# Patient Record
Sex: Female | Born: 1990 | Race: White | Hispanic: No | Marital: Single | Smoking: Current some day smoker
Health system: Southern US, Community
[De-identification: ages and names within clinical notes are randomized; demographics above are authoritative.]

## PROBLEM LIST (undated history)

## (undated) DIAGNOSIS — F419 Anxiety disorder, unspecified: Secondary | ICD-10-CM

## (undated) DIAGNOSIS — I071 Rheumatic tricuspid insufficiency: Secondary | ICD-10-CM

## (undated) DIAGNOSIS — R011 Cardiac murmur, unspecified: Secondary | ICD-10-CM

## (undated) DIAGNOSIS — F329 Major depressive disorder, single episode, unspecified: Secondary | ICD-10-CM

## (undated) DIAGNOSIS — F32A Depression, unspecified: Secondary | ICD-10-CM

## (undated) DIAGNOSIS — I330 Acute and subacute infective endocarditis: Secondary | ICD-10-CM

## (undated) DIAGNOSIS — F17200 Nicotine dependence, unspecified, uncomplicated: Secondary | ICD-10-CM

## (undated) DIAGNOSIS — B999 Unspecified infectious disease: Secondary | ICD-10-CM

## (undated) DIAGNOSIS — I2690 Septic pulmonary embolism without acute cor pulmonale: Secondary | ICD-10-CM

## (undated) DIAGNOSIS — F1990 Other psychoactive substance use, unspecified, uncomplicated: Secondary | ICD-10-CM

## (undated) HISTORY — PX: CHOLECYSTECTOMY: SHX55

---
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
## 2009-08-13 ENCOUNTER — Emergency Department (HOSPITAL_COMMUNITY): Admission: EM | Admit: 2009-08-13 | Discharge: 2009-08-13 | Payer: Self-pay | Admitting: Family Medicine

## 2010-06-12 ENCOUNTER — Inpatient Hospital Stay (HOSPITAL_COMMUNITY)
Admission: AD | Admit: 2010-06-12 | Discharge: 2010-06-12 | Payer: Self-pay | Source: Home / Self Care | Attending: Obstetrics and Gynecology | Admitting: Obstetrics and Gynecology

## 2010-08-02 ENCOUNTER — Emergency Department (HOSPITAL_COMMUNITY): Payer: BC Managed Care – PPO

## 2010-08-02 ENCOUNTER — Inpatient Hospital Stay (HOSPITAL_COMMUNITY)
Admission: EM | Admit: 2010-08-02 | Discharge: 2010-08-03 | DRG: 463 | Disposition: A | Discharge status: Home / Self Care | Payer: BC Managed Care – PPO | Attending: Internal Medicine | Admitting: Internal Medicine

## 2010-08-02 DIAGNOSIS — R4182 Altered mental status, unspecified: Principal | ICD-10-CM | POA: Diagnosis present

## 2010-08-02 DIAGNOSIS — E871 Hypo-osmolality and hyponatremia: Secondary | ICD-10-CM | POA: Diagnosis present

## 2010-08-02 DIAGNOSIS — F1120 Opioid dependence, uncomplicated: Secondary | ICD-10-CM | POA: Diagnosis present

## 2010-08-02 DIAGNOSIS — F411 Generalized anxiety disorder: Secondary | ICD-10-CM | POA: Diagnosis present

## 2010-08-02 DIAGNOSIS — R17 Unspecified jaundice: Secondary | ICD-10-CM | POA: Diagnosis present

## 2010-08-02 DIAGNOSIS — R51 Headache: Secondary | ICD-10-CM | POA: Diagnosis present

## 2010-08-02 LAB — DIFFERENTIAL
Basophils Absolute: 0 10*3/uL (ref 0.0–0.1)
Basophils Relative: 0 % (ref 0–1)
Eosinophils Absolute: 0 10*3/uL (ref 0.0–0.7)
Lymphocytes Relative: 13 % (ref 12–46)
Lymphs Abs: 1.3 10*3/uL (ref 0.7–4.0)
Monocytes Absolute: 0.3 10*3/uL (ref 0.1–1.0)
Monocytes Relative: 3 % (ref 3–12)
Neutro Abs: 8.4 10*3/uL — ABNORMAL HIGH (ref 1.7–7.7)
Neutrophils Relative %: 83 % — ABNORMAL HIGH (ref 43–77)

## 2010-08-02 LAB — CBC
HCT: 37.9 % (ref 36.0–46.0)
Hemoglobin: 13.6 g/dL (ref 12.0–15.0)
MCH: 30.6 pg (ref 26.0–34.0)
MCHC: 35.9 g/dL (ref 30.0–36.0)
MCV: 85.4 fL (ref 78.0–100.0)
Platelets: 319 10*3/uL (ref 150–400)
RBC: 4.44 MIL/uL (ref 3.87–5.11)
RDW: 12 % (ref 11.5–15.5)
WBC: 10 10*3/uL (ref 4.0–10.5)

## 2010-08-02 LAB — ETHANOL: Alcohol, Ethyl (B): <5 mg/dL (ref 0–10)

## 2010-08-02 LAB — COMPREHENSIVE METABOLIC PANEL
ALT: 40 U/L — ABNORMAL HIGH (ref 0–35)
AST: 37 U/L (ref 0–37)
Alkaline Phosphatase: 84 U/L (ref 39–117)
BUN: 20 mg/dL (ref 6–23)
CO2: 18 mEq/L — ABNORMAL LOW (ref 19–32)
Calcium: 9.7 mg/dL (ref 8.4–10.5)
Chloride: 101 mEq/L (ref 96–112)
GFR calc Af Amer: >60 mL/min (ref >60)
GFR calc non Af Amer: >60 mL/min (ref >60)
Sodium: 133 mEq/L — ABNORMAL LOW (ref 135–145)
Total Bilirubin: 1.9 mg/dL — ABNORMAL HIGH (ref 0.3–1.2)

## 2010-08-02 LAB — URINALYSIS, ROUTINE W REFLEX MICROSCOPIC
Bilirubin Urine: NEGATIVE
Hgb urine dipstick: NEGATIVE
Ketones, ur: >80 mg/dL — AB
Nitrite: NEGATIVE
Protein, ur: NEGATIVE mg/dL
Specific Gravity, Urine: 1.03 (ref 1.005–1.030)
Urine Glucose, Fasting: NEGATIVE mg/dL
pH: 6.5 (ref 5.0–8.0)

## 2010-08-02 LAB — RAPID URINE DRUG SCREEN, HOSP PERFORMED
Amphetamines: NOT DETECTED
Barbiturates: NOT DETECTED
Benzodiazepines: NOT DETECTED
Cocaine: NOT DETECTED
Opiates: NOT DETECTED
Tetrahydrocannabinol: NOT DETECTED

## 2010-08-02 LAB — AMMONIA: Ammonia: 17 umol/L (ref 11–35)

## 2010-08-02 LAB — PREGNANCY, URINE: Preg Test, Ur: NEGATIVE

## 2010-08-03 ENCOUNTER — Inpatient Hospital Stay (HOSPITAL_COMMUNITY): Payer: BC Managed Care – PPO

## 2010-08-03 LAB — CBC
HCT: 34.9 % — ABNORMAL LOW (ref 36.0–46.0)
Hemoglobin: 12.3 g/dL (ref 12.0–15.0)
MCH: 30.2 pg (ref 26.0–34.0)
MCHC: 35.2 g/dL (ref 30.0–36.0)
MCV: 85.7 fL (ref 78.0–100.0)
RDW: 12.1 % (ref 11.5–15.5)

## 2010-08-03 LAB — DIFFERENTIAL
Basophils Relative: 0 % (ref 0–1)
Eosinophils Absolute: 0 10*3/uL (ref 0.0–0.7)
Eosinophils Relative: 0 % (ref 0–5)
Lymphocytes Relative: 25 % (ref 12–46)
Lymphs Abs: 1.8 10*3/uL (ref 0.7–4.0)
Monocytes Absolute: 0.5 10*3/uL (ref 0.1–1.0)
Monocytes Relative: 7 % (ref 3–12)
Neutro Abs: 4.7 10*3/uL (ref 1.7–7.7)

## 2010-08-03 LAB — COMPREHENSIVE METABOLIC PANEL
ALT: 32 U/L (ref 0–35)
AST: 27 U/L (ref 0–37)
Albumin: 4.1 g/dL (ref 3.5–5.2)
Alkaline Phosphatase: 68 U/L (ref 39–117)
BUN: 13 mg/dL (ref 6–23)
CO2: 23 mEq/L (ref 19–32)
Calcium: 9.4 mg/dL (ref 8.4–10.5)
Chloride: 104 mEq/L (ref 96–112)
Creatinine, Ser: 0.68 mg/dL (ref 0.4–1.2)
GFR calc Af Amer: >60 mL/min (ref >60)
GFR calc non Af Amer: >60 mL/min (ref >60)
Glucose, Bld: 87 mg/dL (ref 70–99)
Total Bilirubin: 1.7 mg/dL — ABNORMAL HIGH (ref 0.3–1.2)
Total Protein: 6.9 g/dL (ref 6.0–8.3)

## 2010-08-03 LAB — ANA: Anti Nuclear Antibody(ANA): NEGATIVE

## 2010-08-03 LAB — TSH: TSH: 0.716 u[IU]/mL (ref 0.350–4.500)

## 2010-08-03 LAB — SEDIMENTATION RATE: Sed Rate: 9 mm/hr (ref 0–22)

## 2010-08-03 LAB — VITAMIN B12: Vitamin B-12: 510 pg/mL (ref 211–911)

## 2010-08-03 LAB — HEPATITIS PANEL, ACUTE
Hep A IgM: NEGATIVE
Hepatitis B Surface Ag: NEGATIVE

## 2010-08-03 LAB — RPR: RPR Ser Ql: NONREACTIVE

## 2010-08-03 LAB — FOLATE RBC: RBC Folate: 811 ng/mL — ABNORMAL HIGH (ref 180–600)

## 2010-08-05 ENCOUNTER — Inpatient Hospital Stay (HOSPITAL_COMMUNITY)
Admission: EM | Admit: 2010-08-05 | Discharge: 2010-08-08 | DRG: 024 | Disposition: A | Discharge status: Home / Self Care | Payer: BC Managed Care – PPO | Attending: Internal Medicine | Admitting: Internal Medicine

## 2010-08-05 DIAGNOSIS — Z79899 Other long term (current) drug therapy: Secondary | ICD-10-CM

## 2010-08-05 DIAGNOSIS — R569 Unspecified convulsions: Principal | ICD-10-CM | POA: Diagnosis present

## 2010-08-05 DIAGNOSIS — F1120 Opioid dependence, uncomplicated: Secondary | ICD-10-CM | POA: Diagnosis present

## 2010-08-05 DIAGNOSIS — R000 Tachycardia, unspecified: Secondary | ICD-10-CM | POA: Diagnosis present

## 2010-08-05 DIAGNOSIS — F19939 Other psychoactive substance use, unspecified with withdrawal, unspecified: Secondary | ICD-10-CM | POA: Diagnosis present

## 2010-08-05 DIAGNOSIS — F410 Panic disorder [episodic paroxysmal anxiety] without agoraphobia: Secondary | ICD-10-CM | POA: Diagnosis present

## 2010-08-05 DIAGNOSIS — F1920 Other psychoactive substance dependence, uncomplicated: Secondary | ICD-10-CM | POA: Diagnosis present

## 2010-08-05 DIAGNOSIS — E876 Hypokalemia: Secondary | ICD-10-CM | POA: Diagnosis present

## 2010-08-05 LAB — CBC
HCT: 34.3 % — ABNORMAL LOW (ref 36.0–46.0)
Hemoglobin: 12.3 g/dL (ref 12.0–15.0)
MCH: 30.6 pg (ref 26.0–34.0)
MCHC: 35.9 g/dL (ref 30.0–36.0)
Platelets: 260 10*3/uL (ref 150–400)

## 2010-08-05 LAB — URINALYSIS, ROUTINE W REFLEX MICROSCOPIC
Protein, ur: 100 mg/dL — AB
Urine Glucose, Fasting: NEGATIVE mg/dL
Urobilinogen, UA: 0.2 mg/dL (ref 0.0–1.0)
pH: 6.5 (ref 5.0–8.0)

## 2010-08-05 LAB — COMPREHENSIVE METABOLIC PANEL
AST: 21 U/L (ref 0–37)
Albumin: 4.3 g/dL (ref 3.5–5.2)
Alkaline Phosphatase: 67 U/L (ref 39–117)
BUN: 6 mg/dL (ref 6–23)
Chloride: 109 mEq/L (ref 96–112)
Creatinine, Ser: 0.7 mg/dL (ref 0.4–1.2)
GFR calc Af Amer: >60 mL/min (ref >60)
Potassium: 3.4 mEq/L — ABNORMAL LOW (ref 3.5–5.1)
Total Protein: 6.9 g/dL (ref 6.0–8.3)

## 2010-08-05 LAB — RAPID URINE DRUG SCREEN, HOSP PERFORMED
Amphetamines: NOT DETECTED
Cocaine: NOT DETECTED
Opiates: NOT DETECTED
Tetrahydrocannabinol: NOT DETECTED

## 2010-08-05 LAB — DIFFERENTIAL
Lymphocytes Relative: 26 % (ref 12–46)
Monocytes Absolute: 0.8 10*3/uL (ref 0.1–1.0)
Monocytes Relative: 7 % (ref 3–12)
Neutro Abs: 7.5 10*3/uL (ref 1.7–7.7)

## 2010-08-05 LAB — POCT PREGNANCY, URINE: Preg Test, Ur: NEGATIVE

## 2010-08-06 LAB — METHADONE, URINE: Methadone: POSITIVE — AB

## 2010-08-06 NOTE — H&P (Signed)
Savannah, Palmer              ACCOUNT NO.:  [id number]  MEDICAL RECORD NO.:  [id number]           PATIENT TYPE:  E  LOCATION:  [facility]                         FACILITY:  [facility]  PHYSICIAN:  Mariea Stable, MD   DATE OF BIRTH:  [birth year]  DATE OF ADMISSION:  08/05/2010 DATE OF DISCHARGE:                             HISTORY & PHYSICAL   PRIMARY CARE PHYSICIAN:  [facility].  PSYCHIATRIST:  Archer Asa, MD  CHIEF COMPLAINT:  Seizure.  HISTORY OF PRESENT ILLNESS:  Ms. Savannah Palmer is a 20 year old woman with a past medical history significant for chronic methadone use as well as anxiety/panic attack on chronic Xanax who presents with new-onset seizure.  Also of note that the patient just had an admission on February 15 for altered mental status, although the patient was admitted for observation with a pretty extensive workup and thought to be secondary to medications.  Since that discharge, her mother Savannah Palmer) has flown from [place] and has been with her 24/7.  She states that she had read up on methadone as her daughter was trying to detox from it.  Apparently the daughter has been restless and has not had much sleep since discharge.  She was seen seated in a slightly colder position on the couch trying to get some rest.  The mother actually dozed off and subsequently awoke finding her in a what she describes to be tonic-clonic seizure.  Apparently, the patient was having generalized convulsions at that time and she reports it lasted for approximately 30 seconds.  She states after that the patient was confused in what appears to have been a postictal phase.  Within short few minutes, the patient had another seizure that lasted approximately 1 minute.  At that time, EMS was called and the patient was brought to the emergency department. Of note, the patient did have another seizure in the emergency department and has since received Ativan 1 mg IV push  x2.  Currently, the patient is arousable and follows simple commands though she is very somnolent secondary to meds plus or minus postictal phase and most of the history was provided by mother, father, and stepmother.  PAST MEDICAL HISTORY: 1. Recent admission for altered mental status as mentioned above. 2. Anxiety/panic disorder. 3. 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  MEDICATIONS: 1. Possible methadone 100 mg orally daily per mother. 2. Xanax 0.5 mg p.o. q.i.d. p.r.n. anxiety.  SOCIAL HISTORY:  The patient apparently has smoked for approximately 2 years and recently quit.  She drinks socially.  Apparently, she cleans hotels for a living.  She lives alone in her apartment, although mother is currently staying with her.  Again, mother has flown in from [place] to care for her daughter, name Savannah Palmer phone #[phone].  Her father who lives locally is here at bedside as well, his name Savannah Palmer and his phone number is [phone]- [phone].  Of note, his wife, Efraim Kaufmann is also at bedside.  As a family, they have agreed that any of them are to be contacted in case of an emergency.  FAMILY HISTORY:  Mother is 19 years old alive and healthy as is the father.  ALLERGIES:  No known drug allergies.  REVIEW OF SYSTEMS:  As per HPI, currently unobtainable as the patient is markedly lethargic status post seizure and IV Ativan.  PHYSICAL EXAMINATION:  VITAL SIGNS:  Temperature 99.2, blood pressure 109/74, heart rate of 123, respirations 16, and oxygen saturation 95% on room air (this was on presentation).  Current vitals are heart rate of 84, blood pressure 101/61, respirations 20, oxygen saturation 100% on room air. GENERAL:  There is a young woman lying in bed, somnolent, although easily arousable and following simple commands. HEENT:  Head is normocephalic and atraumatic.  Pupils are dilated but equally round and reactive to light.   Extraocular movements are grossly intact.  Sclerae anicteric. NECK:  Supple.  There is no JVD or carotid bruits or thyromegaly. HEART:  There is a normal S1 and S2 with a regular rate and rhythm. There are no murmurs, gallops, or rubs. LUNGS:  Clear to auscultation bilaterally. ABDOMEN:  Positive bowel sounds.  Soft, nontender, and nondistended.  No organomegaly appreciated. EXTREMITIES:  There is no cyanosis, clubbing, or edema. NEUROLOGIC:  Again, the patient is somnolent but easily arousable and able to follow simple commands.  Cranial nerves are grossly intact.  The patient is moving all extremities.  LABORATORY DATA:  Urine pregnancy test is negative.  Urinalysis shows 15 ketones, large blood, 100 protein, and trace leukocyte esterase with 0-2 wbc's on microscopy along with too numerous to count red blood cells.  ASSESSMENT AND PLAN: 1. Seizure.  This is most likely secondary to benzo withdrawal.     Again, the patient follows with Dr. Donell Beers and apparently has     Xanax 0.5 mg p.o. q.i.d. p.r.n. anxiety/panic.  However, given her     history and furthermore collaborated by family, there is great     concern that she may be overusing these medications and/or possibly     other medications that she is not prescribed.  Furthermore, the     patient is on chronic methadone and although she received a dose on     Thursday prior to discharge, apparently she has not had any since     that she wanted to detox off it.  Her mother confirms that she has     not had any.  However, I do not think that methadone would be a     likely cause of her seizure.  The patient did have an extensive     workup just recently without any significant metabolic     abnormalities identified.  She also had a CT scan of head at that     time without any acute findings.  At this point, we will go ahead     and admit to a telemetry floor to monitor for further withdrawal     symptoms.  We will go ahead and  place her on seizure precautions.     We will give her 0.5 mg of Ativan IV t.i.d. to help prevent further     seizures.  We will also prescribe Ativan 2 mg IV p.r.n. seizures.     We will go ahead and check some basic labs including a CBC and a     CMP.  Furthermore, we will check an alcohol level as well as a     urine drug screen and urine methadone.  More importantly, the     patient  will need a social work consult for further resources on     detox.  May also consider a Psychiatry consultation tomorrow to see     if the patient would be eligible for inpatient detox. 2. History of chronic methadone use/dependency.  Please see of above     and the recent admission.  The patient is currently trying to detox     and has been 2 days without it.  At this point, we will not     continue since I do not think this is the source of her seizure and     she does not have any chronic pain.  This was actually being used     according to family for history of OxyContin abuse.  May consider     starting once the patient's mental status clears and further     information is obtained. 3. History of anxiety/panic disorder.  The patient follows with Dr.     Donell Beers for this, it would be beneficial to     obtain the records for further details.  At this point, we will     treat as per #1.  Again, consider Psychiatry consult in the morning     to evaluate for further treatment options with benzos and this     possible benzo withdraws.  It seems that the patient is misusing     her prescribed benzos.     Mariea Stable, MD     MA/MEDQ  D:  08/05/2010  T:  08/05/2010  Job:  [id number]  Electronically Signed by Mariea Stable MD on 08/06/2010 05:26:40 AM

## 2010-08-07 ENCOUNTER — Inpatient Hospital Stay (HOSPITAL_COMMUNITY): Payer: BC Managed Care – PPO

## 2010-08-07 DIAGNOSIS — F1120 Opioid dependence, uncomplicated: Secondary | ICD-10-CM

## 2010-08-07 LAB — BASIC METABOLIC PANEL
Calcium: 9.3 mg/dL (ref 8.4–10.5)
Creatinine, Ser: 0.69 mg/dL (ref 0.4–1.2)
GFR calc Af Amer: >60 mL/min (ref >60)
GFR calc non Af Amer: >60 mL/min (ref >60)
Sodium: 141 mEq/L (ref 135–145)

## 2010-08-07 LAB — CBC
MCH: 30 pg (ref 26.0–34.0)
MCHC: 34.7 g/dL (ref 30.0–36.0)
Platelets: 254 10*3/uL (ref 150–400)

## 2010-08-07 LAB — LACTIC ACID, PLASMA: Lactic Acid, Venous: 2.5 mmol/L — ABNORMAL HIGH (ref 0.5–2.2)

## 2010-08-07 NOTE — Discharge Summary (Signed)
Savannah Palmer, Savannah Palmer              ACCOUNT NO.:  [id number]  MEDICAL RECORD NO.:  [id number]           PATIENT TYPE:  I  LOCATION:  1412                         FACILITY:  [facility]  PHYSICIAN:  Hillery Aldo, M.D.   DATE OF BIRTH:  07/29/1990  DATE OF ADMISSION:  08/02/2010 DATE OF DISCHARGE:  08/03/2010                              DISCHARGE SUMMARY   PRIMARY CARE PHYSICIAN:  [facility].  DISCHARGE DIAGNOSES: 1. Transient altered mental status. 2. Transient headaches. 3. Hyperbilirubinemia. 4. Hyponatremia, resolved. 5. Questionable history of methadone addiction. 6. Anxiety.  DISCHARGE MEDICATIONS: 1. Xanax 0.5 mg p.o. q.i.d. p.r.n. anxiety. 2. Questionable methadone 110 mg p.o. daily.  CONSULTATIONS:  None.  BRIEF ADMISSION HPI:  The patient is a 20 year old female who was evaluated for headache in the emergency department and was felt to have altered mental status with confusion and disorientation.  Uponevaluation by the Hospitalist, however, the patient was oriented x3 and completely alert.  Nevertheless, she was admitted for 23-hour observation, so a workup could be completed.  For the full details, please see the dictated report done by Dr. Pearson Grippe.  PROCEDURES AND DIAGNOSTIC STUDIES: 1. CT scan of the head on August 02, 2010, showed normal findings. 2. Abdominal ultrasound on August 03, 2010, showed possible mild     intrahepatic ductal dilatation, but no specific or acute     abnormalities and no focal liver abnormalities.  DISCHARGE LABORATORY VALUES:  Acute hepatitis panel was negative.  ANA was negative.  ESR was 9.  RPR was nonreactive.  Sodium was 135, potassium 3.8, chloride 104, bicarb 23, BUN 13, creatinine 0.68, glucose 87, total bilirubin 1.7, alkaline phosphatase 68, AST 27, ALT 32, total protein 6.9, albumin 4.1.  White blood cell count was 7, hemoglobin 12.3, hematocrit 34.9, platelets 274.  Serum folate was greater than  20, vitamin B12 510, TSH 0.716, ammonia 17.  Urine drug screen was negative. Urinalysis was negative for signs of infection.  There was 80 mg/dL of ketones and no blood or protein.  Urine pregnancy testing was negative. Alcohol level was less than 5.  HOSPITAL COURSE BY PROBLEMS: 1. Altered mental status:  Unclear etiology.  The patient was normal     on evaluation by the admitting hospitalist and has been normal by     my exam as well.  She has no evidence of hyperammonemia, CT scan     abnormalities of the brain, or focal findings on neurologic     testing.  The patient may have been intoxicated on initial     presentation, although her urine drug screen is negative and her     alcohol level was negative.  She may have an underlying psychiatric     condition and need further outpatient evaluation should she become     altered again. 2. Hyperbilirubinemia:  The patient may have underlying Gilbert     disease.  There is no evidence of viral hepatitis.  She had been     instructed to follow up with the Urgent Care in 3-6 months for     repeat check. 3. Hyponatremia:  The patient's hyponatremia resolved with IV fluids. 4. Questionable narcotics addiction:  The patient claims to be on     methadone daily.  She can follow up with her chronic pain with Dr.     Virginia Rochester, her prescriber for ongoing management. 5. Anxiety:  The patient was provided with Xanax p.r.n., which she can     continue postdischarge.  Would recommend psychiatric followup.  CONDITION ON DISCHARGE:  Stable.  Time spent coordinating care for discharge and discharge instructions including face-to-face time equals 20 minutes.     Hillery Aldo, M.D.     CR/MEDQ  D:  08/03/2010  T:  08/04/2010  Job:  [id number]  cc:   [facility]  Electronically Signed by Hillery Aldo M.D. on 08/07/2010 04:18:13 PM

## 2010-08-09 NOTE — Consult Note (Signed)
NAMESARITA, HAKANSON              ACCOUNT NO.:  [id number]  MEDICAL RECORD NO.:  [id number]           PATIENT TYPE:  I  LOCATION:  3703                         FACILITY:  [facility]  PHYSICIAN:  Eulogio Ditch, MD DATE OF BIRTH:  [birth year]  DATE OF CONSULTATION:  08/07/2010 DATE OF DISCHARGE:                                CONSULTATION   REASON FOR CONSULTATION:  Benzoin methadone dependence.  HISTORY OF PRESENT ILLNESS:  A 20 year old white female who was seen in her room with her mother with the patient's consent.  The patient has a long history of abuse, opioids and benzos.  The patient is on methadone 100 mg at a clinic on [address] along with Xanax 0.5 mg q.i.d.  The patient told me that she was abusing pain pills before and to get off the pain pills she was started on methadone 100 mg p.o. daily.  Now the patient does not want to take methadone.  She just want to be detoxed from methadone.  The patient had a seizure in the hospital most likely from benzos withdrawal.  The patient is getting Ativan p.r.n..  The patient has a history of bipolar disorder.  She was in the past on Depakote and Lamictal, but she is not taking those medication for the last 2 years.  She was followed by Dr. Tomasa Rand, the outpatient psychiatrist, and later on Dr. Donell Beers.  The patient has never been admitted to a psych hospital.  No history of suicide attempt in the past.  Mood is poor.  The patient is logical and goal directed during interview.  Denies hearing any voices.  Denies any suicidal or homicidal ideations.  The patient last time took 100 mg methadone last Thursday and this is day #4 without the methadone.  The patient still had some withdrawal symptoms from opioids but she is on a clonidine detox protocol.  The patient wanted to be continued on her Xanax for anxiety as nothing has helped in the past besides Xanax.  SOCIAL HISTORY:  The patient does a cleaning job in the  hotels. Currently, her mother is staying with the patient.  MEDICAL HISTORY:  History of seizure, from benzo withdrawal likely.  ALLERGIES:  No known drug allergies.  MENTAL STATUS EXAMINATION:  Calm, cooperative during the interview. Fair eye contact.  Logical and goal-directed during the interview. Speech slow, soft.  Mood anxious.  Affect mood congruent.  Thought process logical and goal-directed.  Thought content not suicidal or homicidal, not delusional.  Thought perception no audiovisual hallucination reported, not internally preoccupied.  Cognition alert, awake, oriented x3.  Memory immediate, recent, remote fair.  Attention and concentration fair.  Abstraction ability good.  Fund of knowledge fair.  Insight and judgment fair.  DIAGNOSES:  AXIS I:  Chronic opioids and benzo dependence.  Mood disorder not otherwise specified, rule out bipolar disorder, rule out substance-induced manic symptoms/bipolar disorder. AXIS II:  Deferred. AXIS III:  See medical notes. AXIS IV:  Chronic drug abuse. AXIS V:  50.  RECOMMENDATIONS: 1. The patient can be continued on detox from methadone on a clonidine     detox protocol. 2.  The patient is on Ativan p.r.n.  The patient should be started back     on Xanax 0.5 q.i.d.  First her methadone detox should be completed     and then benzos which is very difficult to detox in the inpatient     setting can be slowly detoxed in the outpatient setting.  The     patient also had a seizure episode from benzos withdrawal. 3. I discussed different medications with the patient.  The patient     agreed to be started on Seroquel 25 mg q.i.d. for anxiety. 4. This patient will also need to followup appointment with a     psychiatrist in the outpatient setting. 5. Psychoeducation given to the patient regarding the abuse of the     medication and taking Xanax along with methadone.  Both can lead to     respiratory depression.  I will follow up on this  patient as     needed.     Eulogio Ditch, MD     SA/MEDQ  D:  08/07/2010  T:  08/07/2010  Job:  [id number]  Electronically Signed by Eulogio Ditch  on 08/09/2010 04:49:17 AM

## 2010-08-15 NOTE — Discharge Summary (Signed)
NAMEALIXIS, Savannah Palmer              ACCOUNT NO.:  [id number]  MEDICAL RECORD NO.:  [id number]           PATIENT TYPE:  I  LOCATION:  3703                         FACILITY:  [facility]  PHYSICIAN:  Erick Blinks, MD     DATE OF BIRTH:  1991-06-11  DATE OF ADMISSION:  08/05/2010 DATE OF DISCHARGE:  08/08/2010                              DISCHARGE SUMMARY   PRIMARY CARE PHYSICIAN:  Urgent Care.  PSYCHIATRIST:  Archer Asa, MD  DISCHARGE DIAGNOSES: 1. Seizures secondary to benzodiazepine withdrawal. 2. Benzodiazepine withdrawal. 3. Methadone withdrawal. 4. Tachycardia secondary to benzodiazepine withdrawal. 5. History of anxiety/panic disorder. 6. Hypokalemia.  DISCHARGE MEDICATIONS: 1. Clonidine 0.1 mg to be taken as q.i.d. for 1 day, then b.i.d. for 2     days, then daily for 2 days, and then stop. 2. Seroquel 25 mg p.o. q.i.d. 3. Xanax 0.5 mg p.o. q.6 h. p.r.n.  ADMISSION HISTORY:  This is a 20 year old woman with the past medical history of chronic methadone use as well as anxiety/panic attack who had ran out of her Xanax approximately 1 week prior to admission.  The patient presented with new onset seizures and had a seizure here in the ER as well.  She was subsequently admitted for further evaluation.  For further details, please refer to history and physical dictated by Dr. Onalee Hua on August 05, 2010.  HOSPITAL COURSE:  Benzodiazepine withdrawal.  The patient was seen in consultation by Dr. Rogers Blocker from the Psychiatry Service.  He was felt that since the patient had both opiates as well as the benzodiazepine withdrawal going on, it would be better to treat the opiate withdrawal at this time and address the benzodiazepine withdrawal once the opiate withdrawal has resolved.  Her benzodiazepine withdrawal will have to be tapered very slowly over months.  She was initially placed on Ativan. This was changed to Xanax per Dr. Alvie Heidelberg recommendation.  She was also  started on Seroquel for anxiety.  The patient was found to be significantly tachycardic.  This was thought to be secondary to her withdrawal, but that has since resolved.  She did have an EEG done due to persistence which she was having, but results of this did not show any epileptiform activity.  She is no longer having any symptoms at this time.  She is feeling better and wanted to go home.  The patient will be followed up by Psychiatry today, and she is cleared for discharge.  We will plan on sending her home today.  She has been set up with an outpatient appointment with her primary psychiatrist on August 10, 2010, at 10:30 a.m.  She has been advised to keep this appointment.  The patient has good support with her mother.  CONSULTATIONS:  Psychiatry, Eulogio Ditch, MD with recommendations as above.  PROCEDURES:  None.  DIAGNOSTIC IMAGING: 1. Chest x-ray from August 07, 2010, shows negative chest. 2. EEG from August 07, 2010, shows drowsiness and sleep stages.     Given the patient's medication as listed, this drowsiness would be     well attributed due to the medication as a side-effect.  Epileptiform activity is not seen.  DISCHARGE INSTRUCTIONS:  The patient should continue on regular diet. Conduct activity as tolerated.  Follow up with her primary psychiatrist as scheduled.  She advised to be compliant with her medication.     Erick Blinks, MD     JM/MEDQ  D:  08/08/2010  T:  08/09/2010  Job:  [id number]  cc:   Urgent Care Archer Asa, M.D.  Electronically Signed by Erick Blinks  on 08/15/2010 07:16:19 PM

## 2010-08-28 LAB — WET PREP, GENITAL
Trich, Wet Prep: NONE SEEN
Yeast Wet Prep HPF POC: NONE SEEN

## 2010-08-28 LAB — CBC
Platelets: 195 10*3/uL (ref 150–400)
RDW: 11.8 % (ref 11.5–15.5)
WBC: 6 10*3/uL (ref 4.0–10.5)

## 2010-08-28 LAB — POCT PREGNANCY, URINE: Preg Test, Ur: NEGATIVE

## 2010-09-06 LAB — POCT URINALYSIS DIP (DEVICE)
Bilirubin Urine: NEGATIVE
Ketones, ur: NEGATIVE mg/dL
Specific Gravity, Urine: <=1.005 (ref 1.005–1.030)
pH: 6.5 (ref 5.0–8.0)

## 2010-09-06 NOTE — H&P (Signed)
Savannah Palmer, Savannah Palmer              ACCOUNT NO.:  [id number]  MEDICAL RECORD NO.:  [id number]           PATIENT TYPE:  E  LOCATION:  WLED                         FACILITY:  [facility]  PHYSICIAN:  Massie Maroon, MD        DATE OF BIRTH:  1990/06/19  DATE OF ADMISSION:  08/02/2010 DATE OF DISCHARGE:                             HISTORY & PHYSICAL   CHIEF COMPLAINT:  Altered mental status.  HISTORY OF PRESENT ILLNESS:  A 20 year old female apparently complains of headache which is all over and associated with some nausea but no vomiting.  She denies any photophobia.  She denies chest pain, palpitations, shortness of breath, dysuria, hematuria.  Apparently in the ED, she was felt to be altered.  A CT scan of brain was performed and it was negative for any acute process.  Family apparently told the ER that she was confused earlier today and the patient was not alert and oriented to place or time.  She is however to me alert and oriented x3. Her ALT was noted be elevated.  Ammonia level is pending.  The patient will be admitted for altered mental status.  PAST MEDICAL HISTORY: 1. Anxiety. 2. Depression. 3. Narcotic addiction.  PAST SURGICAL HISTORY:  None.  SOCIAL HISTORY:  The patient quit smoking recently.  She smoked one-pack per day x2 years.  She does not drink.  She cleans for hotels.  She is single without any children.  FAMILY HISTORY:  Mother is alive at age 21 and healthy.  Father is alive at age 21 and healthy.  ALLERGIES:  No known drug allergies.  MEDICATIONS: 1. Methadone 110 mg p.o. daily. 2. Xanax 0.4 mg p.o. q.i.d.  REVIEW OF SYSTEMS:  Negative for all 10 organ systems except for pertinent positives stated above.  PHYSICAL EXAMINATION:  VITAL SIGNS:  Temperature 99.2, pulse 101, blood pressure 136/81, pulse ox 100% on room air. HEENT:  Anicteric, EOMI, no nystagmus, pupils 1.5 mm, symmetric.  Direct consensual reflexes are intact.  Mucous membranes are moist.   Tongue midline. NECK:  No JVD, no bruit, no thyromegaly, no adenopathy. HEART:  Regular rate and rhythm.  S1, S2 with a 2/6 systolic ejection murmur at the apex. Lungs:  Clear to auscultation bilaterally. ABDOMEN:  Soft, nontender, nondistended.  Positive bowel sounds. EXTREMITIES:  No cyanosis, clubbing or edema. SKIN:  No rashes. LYMPH NODES:  No adenopathy. NEURO EXAM:  Nonfocal.  LABORATORY DATA:  Urine drug screen negative.  Urinalysis, ketones greater than 80.  Urine pregnancy test negative.  Alcohol less than 5. Sodium 133 (potassium 3.9, BUN 20, creatinine 0.89, AST 37, ALT 40), alk phos 84, total bilirubin 1.9.  WBC 10.0, hemoglobin 13.6, platelet count 319.  CT brain negative for any acute process.  ASSESSMENT/PLAN: 1. Altered mental status, possibly secondary to withdrawal from     methadone ?  Check B12, folic acid, ESR, ANA, RPR, TSH, ammonia     level STAT.  We will observe the patient.  Repeat a CBC and CMP in     a.m. 2. Abnormal liver function tests.  Check a right upper quadrant  ultrasound.  Check an acute hepatitis panel. 3. Hyponatremia.  Repeat CMET in the morning, still consider checking     serum OSM, urine OSM, urine sodium, TSH and cortisol. 4. Anxiety.  Xanax 0.5 mg p.o. q.i.d. p.r.n., chronic pain     syndrome/narcotic addiction.  Methadone 100 mg p.o. daily.     Massie Maroon, MD     JYK/MEDQ  D:  08/02/2010  T:  08/02/2010  Job:  [id number]  Electronically Signed by Pearson Grippe MD on 09/06/2010 09:41:09 PM

## 2010-09-25 LAB — POCT I-STAT, CHEM 8
Calcium, Ion: 1.21 mmol/L (ref 1.12–1.32)
HCT: 42 % (ref 36.0–46.0)
Hemoglobin: 14.3 g/dL (ref 12.0–15.0)
TCO2: 24 mmol/L (ref 0–100)

## 2010-09-25 LAB — DIFFERENTIAL
Basophils Absolute: 0 10*3/uL (ref 0.0–0.1)
Lymphs Abs: 2 10*3/uL (ref 0.7–4.0)
Monocytes Absolute: 0.2 10*3/uL (ref 0.1–1.0)
Monocytes Relative: 5 % (ref 3–12)

## 2010-09-25 LAB — RAPID URINE DRUG SCREEN, HOSP PERFORMED
Benzodiazepines: POSITIVE — AB
Cocaine: NOT DETECTED

## 2010-09-25 LAB — CBC
HCT: 40 % (ref 36.0–46.0)
Platelets: 213 10*3/uL (ref 150–400)
RDW: 12.5 % (ref 11.5–15.5)

## 2010-09-25 LAB — ETHANOL: Alcohol, Ethyl (B): <5 mg/dL (ref 0–10)

## 2011-06-29 ENCOUNTER — Encounter (HOSPITAL_COMMUNITY): Payer: Self-pay

## 2011-06-29 ENCOUNTER — Emergency Department (INDEPENDENT_AMBULATORY_CARE_PROVIDER_SITE_OTHER)
Admission: EM | Admit: 2011-06-29 | Discharge: 2011-06-29 | Disposition: A | Discharge status: Home / Self Care | Payer: Self-pay | Source: Home / Self Care | Attending: Family Medicine | Admitting: Family Medicine

## 2011-06-29 DIAGNOSIS — F411 Generalized anxiety disorder: Secondary | ICD-10-CM

## 2011-06-29 DIAGNOSIS — F419 Anxiety disorder, unspecified: Secondary | ICD-10-CM

## 2011-06-29 HISTORY — DX: Anxiety disorder, unspecified: F41.9

## 2011-06-29 MED ORDER — ALPRAZOLAM 0.5 MG PO TABS: 0.5000 mg | ORAL_TABLET | Freq: Four times a day (QID) | ORAL | Status: AC | PRN

## 2011-06-29 NOTE — ED Provider Notes (Signed)
History     CSN: [id number]  Arrival date & time 06/29/11  5621   First MD Initiated Contact with Patient 06/29/11 1009      Chief Complaint  Patient presents with  . Medication Refill    (Consider location/radiation/quality/duration/timing/severity/associated sxs/prior treatment) HPI Comments: Savannah Palmer presents for a refill of her Xanax. She reports a chronic hx of anxiety and panic attacks. She has recently moved back to [place] from [place] and has not established care with a psychiatrist yet, though she reports that she thinks that her father has made an appointment for her on the 21st of this month. She reports being on several different regimen over the years including daily benzodiazepenes, Buspar, Prozac (did not tolerate), and other medications which she is unable to remember. Lately, she has only been taking Xanax 0.5 mg FOUR TIMES daily AS NEEDED; she admits trying not to take it that often. We discuss the importance of regular psychiatric follow up, the dangers and risks of daily benzodiazepene use, potential of abuse, and danger of stopping these medications abruptly. I am willing to refill a small amount today given the risk of benzodiazepene withdrawal, and that she thinks she has a follow-up appointment. We discussed the [facility] by [facility] as an option as well.   Patient is a 21 y.o. female presenting with anxiety. The history is provided by the patient.  Anxiety This is a chronic problem. The current episode started more than 1 week ago. The problem occurs constantly. The problem has not changed since onset.Pertinent negatives include no chest pain. The symptoms are aggravated by nothing. The symptoms are relieved by medications. She has tried nothing for the symptoms.    Past Medical History  Diagnosis Date  . Anxiety     Past Surgical History  Procedure Date  . Cholecystectomy     History reviewed. No pertinent family history.  History    Substance Use Topics  . Smoking status: Never Smoker   . Smokeless tobacco: Not on file  . Alcohol Use: Yes    OB History    Grav Para Term Preterm Abortions TAB SAB Ect Mult Living                  Review of Systems  Constitutional: Negative.   HENT: Negative.   Eyes: Negative.   Respiratory: Negative.   Cardiovascular: Negative.  Negative for chest pain.  Gastrointestinal: Negative.   Genitourinary: Negative.   Musculoskeletal: Negative.   Skin: Negative.   Neurological: Negative.   Psychiatric/Behavioral: Positive for sleep disturbance. Negative for self-injury. The patient is nervous/anxious and is hyperactive.     Allergies  Review of patient's allergies indicates no known allergies.  Home Medications   Current Outpatient Rx  Name Route Sig Dispense Refill  . ALPRAZOLAM 0.5 MG PO TABS Oral Take 0.5 mg by mouth at bedtime as needed.    . ALPRAZOLAM 0.5 MG PO TABS Oral Take 1 tablet (0.5 mg total) by mouth 4 (four) times daily as needed for anxiety (Take UP TO four times daily AS NEEDED). 40 tablet 0    BP 115/66  Pulse 78  Temp(Src) 99.7 F (37.6 C) (Oral)  Resp 18  SpO2 99%  LMP 06/27/2011  Physical Exam  Nursing note and vitals reviewed. Constitutional: She is oriented to person, place, and time. She appears well-developed and well-nourished.  HENT:  Head: Normocephalic and atraumatic.  Eyes: EOM are normal.  Neck: Normal range of motion.  Cardiovascular: Normal  rate, regular rhythm and normal heart sounds.   Pulmonary/Chest: Effort normal and breath sounds normal.  Musculoskeletal: Normal range of motion.  Neurological: She is alert and oriented to person, place, and time.  Skin: Skin is warm and dry.  Psychiatric: She has a normal mood and affect. Her speech is normal and behavior is normal. Judgment and thought content normal.    ED Course  Procedures (including critical care time)  Labs Reviewed - No data to display No results found.   1.  Anxiety       MDM  Refilled Xanax; discuss the importance of psychiatric follow-up, abuse potential of daily benzodiazepene use, and risks of abrupt cessation/running out of these medications. Given information on [facility].        Richardo Priest, MD 06/29/11 1113

## 2011-06-29 NOTE — Discharge Instructions (Signed)
Please follow up with your mental health provider as scheduled and as discussed. If you are unable to keep or make an appointment, the [facility], listed on your discharge papers, is available daily for an assessment. If your symptoms worsen in any way, present to the nearest Emergency Department.

## 2011-06-29 NOTE — ED Notes (Addendum)
needs new Rx for xanax; reportedly recent move to area from [place], out of meds, unable to set up w psych as yet ; having panic attacks ; had reportedly been using xanax 0.5 QID

## 2011-08-20 ENCOUNTER — Encounter (HOSPITAL_COMMUNITY): Payer: Self-pay

## 2011-08-20 ENCOUNTER — Emergency Department (INDEPENDENT_AMBULATORY_CARE_PROVIDER_SITE_OTHER)
Admission: EM | Admit: 2011-08-20 | Discharge: 2011-08-20 | Disposition: A | Discharge status: Home / Self Care | Payer: Self-pay | Source: Home / Self Care

## 2011-08-20 DIAGNOSIS — F419 Anxiety disorder, unspecified: Secondary | ICD-10-CM

## 2011-08-20 DIAGNOSIS — F411 Generalized anxiety disorder: Secondary | ICD-10-CM

## 2011-08-20 NOTE — Discharge Instructions (Signed)
Contact [facility] to schedule an appt for evaluation. They will then determine the appropriate prescription medications for you.   Anxiety and Panic Attacks Anxiety is your body's way of reacting to real danger or something you think is a danger. It may be fear or worry over a situation like losing your job. Sometimes the cause is not known. A panic attack is made up of physical signs like sweating, shaking, or chest pain. Anxiety and panic attacks may start suddenly. They may be strong. They may come at any time of day, even while sleeping. They may come at any time of life. Panic attacks are scary, but they do not harm you physically.  HOME CARE  Avoid any known causes of your anxiety.   Try to relax. Yoga may help. Tell yourself everything will be okay.   Exercise often.   Get expert advice and help (therapy) to stop anxiety or attacks from happening.   Avoid caffeine, alcohol, and drugs.   Only take medicine as told by your doctor.  GET HELP RIGHT AWAY IF:  Your attacks seem different than normal attacks.   Your problems are getting worse or concern you.  MAKE SURE YOU:  Understand these instructions.   Will watch your condition.   Will get help right away if you are not doing well or get worse.  Document Released: 07/07/2010 Document Revised: 05/24/2011 Document Reviewed: 07/07/2010 [facility] Patient Information 2012 [place], [place].

## 2011-08-20 NOTE — ED Provider Notes (Signed)
History     CSN: [id number]  Arrival date & time 08/20/11  1249   None     Chief Complaint  Patient presents with  . Anxiety    (Consider location/radiation/quality/duration/timing/severity/associated sxs/prior treatment) HPI Comments: Patient presents today requesting prescription refill of Xanax. She states she has a history of anxiety and panic attacks. She reports that she recently moved to [place] from [place], and her last doctor was in [place]. She states that she has run out of her Xanax and took her last pill this morning. Upon chart review it was noted that patient was seen in our urgent care on 06/29/2011 with the same history. At that time she stated that she had an appointment set up with someone on January 21st. Patient states that she did not keep this appointment that she was in [place] at that time.    Past Medical History  Diagnosis Date  . Anxiety   . Anxiety     Past Surgical History  Procedure Date  . Cholecystectomy     History reviewed. No pertinent family history.  History  Substance Use Topics  . Smoking status: Never Smoker   . Smokeless tobacco: Not on file  . Alcohol Use: Yes    OB History    Grav Para Term Preterm Abortions TAB SAB Ect Mult Living                  Review of Systems  Respiratory: Positive for shortness of breath.   Cardiovascular: Negative for chest pain.  Psychiatric/Behavioral: Positive for decreased concentration. The patient is nervous/anxious.     Allergies  Review of patient's allergies indicates no known allergies.  Home Medications   Current Outpatient Rx  Name Route Sig Dispense Refill  . ALPRAZOLAM 0.5 MG PO TABS Oral Take 0.5 mg by mouth at bedtime as needed.      BP 135/89  Pulse 60  Temp(Src) 98.3 F (36.8 C) (Oral)  Resp 16  SpO2 100%  Physical Exam  Nursing note and vitals reviewed. Constitutional: She appears well-developed and well-nourished. No distress.  HENT:  Head:  Normocephalic and atraumatic.  Right Ear: External ear normal.  Left Ear: External ear normal.  Nose: Nose normal.  Eyes: EOM and lids are normal.  Skin: Skin is intact.  Psychiatric: She has a normal mood and affect. Her behavior is normal.    ED Course  Procedures (including critical care time)  Labs Reviewed - No data to display No results found.   1. Anxiety       MDM  Patient was advised that she would not receive further prescription refills of Xanax from our urgent care, and will need to obtain a psychiatrist or primary care provider for refills. [place] narcotic registry 1 yr hx reviewed, though pt reports recently moving to [place] from [place].         Melody Comas, [place] 08/20/11 [phone]

## 2011-08-20 NOTE — ED Notes (Signed)
States she is having panic attacks, and needs to get a MD to help her w her issues since she moved here from [place] ( ha snot done so as yet) Reports she is using xanax 0.5 4x day for her symptoms; Had patient complete  GAD 7 anxiety scale evaluation form, and has total of 17

## 2011-08-21 ENCOUNTER — Ambulatory Visit (HOSPITAL_COMMUNITY): Admission: RE | Admit: 2011-08-21 | Payer: Self-pay | Source: Home / Self Care | Admitting: Psychiatry

## 2011-08-21 NOTE — ED Provider Notes (Signed)
Medical screening examination/treatment/procedure(s) were performed by resident physician or non-physician practitioner and as supervising physician I was immediately available for consultation/collaboration.   Trenita Hulme DOUGLAS MD.    Inice Sanluis Douglas Merrie Epler, MD 08/21/11 1514 

## 2013-09-17 ENCOUNTER — Other Ambulatory Visit (HOSPITAL_COMMUNITY)
Admission: RE | Admit: 2013-09-17 | Discharge: 2013-09-17 | Disposition: A | Discharge status: Home / Self Care | Payer: 59 | Source: Ambulatory Visit | Attending: Emergency Medicine | Admitting: Emergency Medicine

## 2013-09-17 ENCOUNTER — Emergency Department (INDEPENDENT_AMBULATORY_CARE_PROVIDER_SITE_OTHER)
Admission: EM | Admit: 2013-09-17 | Discharge: 2013-09-17 | Disposition: A | Discharge status: Home / Self Care | Payer: 59 | Source: Home / Self Care | Attending: Emergency Medicine | Admitting: Emergency Medicine

## 2013-09-17 ENCOUNTER — Encounter (HOSPITAL_COMMUNITY): Payer: Self-pay | Admitting: Emergency Medicine

## 2013-09-17 DIAGNOSIS — N760 Acute vaginitis: Secondary | ICD-10-CM | POA: Insufficient documentation

## 2013-09-17 DIAGNOSIS — F411 Generalized anxiety disorder: Secondary | ICD-10-CM

## 2013-09-17 DIAGNOSIS — Z202 Contact with and (suspected) exposure to infections with a predominantly sexual mode of transmission: Secondary | ICD-10-CM

## 2013-09-17 DIAGNOSIS — F419 Anxiety disorder, unspecified: Secondary | ICD-10-CM

## 2013-09-17 DIAGNOSIS — Z113 Encounter for screening for infections with a predominantly sexual mode of transmission: Secondary | ICD-10-CM | POA: Insufficient documentation

## 2013-09-17 LAB — HIV ANTIBODY (ROUTINE TESTING W REFLEX): HIV: NONREACTIVE

## 2013-09-17 LAB — RPR: RPR: NONREACTIVE

## 2013-09-17 MED ORDER — CLONAZEPAM 0.5 MG PO TABS: 0.5000 mg | ORAL_TABLET | Freq: Three times a day (TID) | ORAL | Status: DC | PRN

## 2013-09-17 MED ORDER — ESCITALOPRAM OXALATE 20 MG PO TABS: 20.0000 mg | ORAL_TABLET | Freq: Every day | ORAL | Status: DC

## 2013-09-17 NOTE — ED Notes (Signed)
Pt requesting std testing.  Denies any symptoms.   Also c/o anxiety. States "has an appointment with Sales executivemeredith baker in two weeks".   Having chest tightness and shakes.

## 2013-09-17 NOTE — ED Provider Notes (Signed)
  Chief Complaint   Chief Complaint  Patient presents with  . Exposure to STD    no symptoms.  just want testing.   . Anxiety    History of Present Illness   Savannah Palmer is a 23 year old female who comes in today for STD testing. She denies any symptoms. Has had no discharge, itching, or odor, or pelvic pain. She also will has a history of anxiety. She is due to see a psychiatrist in about 2 weeks. She has been maintained on clonazepam 0.5 mg 3 times a day as needed and Lexapro 20 mg per day. She feels mildly depressed but denies any suicidal ideation or thoughts of self-harm or harming anyone else.  Review of Systems   Other than as noted above, the patient denies any of the following symptoms: Systemic:  No fever or chills GI:  No abdominal pain, nausea, vomiting, diarrhea, constipation, melena or hematochezia. GU:  No dysuria, frequency, urgency, hematuria, vaginal discharge, itching, or abnormal vaginal bleeding.  PMFSH   Past medical history, family history, social history, meds, and allergies were reviewed.    Physical Examination    Vital signs:  BP 118/79  Pulse 87  Temp(Src) 98.1 F (36.7 C) (Oral)  Resp 20  SpO2 97% General:  Alert, oriented and in no distress. Lungs:  Breath sounds clear and equal bilaterally.  No wheezes, rales or rhonchi. Heart:  Regular rhythm.  No gallops or murmers. Abdomen:  Soft, flat and non-distended.  No organomegaly or mass.  No tenderness, guarding or rebound.  Bowel sounds normally active. Pelvic exam:  Normal external genitalia, vaginal and cervical mucosa were normal. No cervical motion pain. Uterus was normal in size and shape and nontender. No adnexal masses or tenderness.  DNA probes for gonorrhea, Chlamydia, Trichomonas, Gardnerella, Candida were obtained. Skin:  Clear, warm and dry.  Labs   Results for orders placed during the hospital encounter of 09/17/13  RPR      Result Value Ref Range   RPR NON REACTIVE  NON  REACTIVE    Assessment   The primary encounter diagnosis was Potential exposure to STD. A diagnosis of Anxiety was also pertinent to this visit.       Plan    1.  Meds:  The following meds were prescribed:   Discharge Medication List as of 09/17/2013 11:55 AM    START taking these medications   Details  clonazePAM (KLONOPIN) 0.5 MG tablet Take 1 tablet (0.5 mg total) by mouth 3 (three) times daily as needed for anxiety., Starting 09/17/2013, Until Discontinued, Print    escitalopram (LEXAPRO) 20 MG tablet Take 1 tablet (20 mg total) by mouth daily., Starting 09/17/2013, Until Discontinued, Normal        2.  Patient Education/Counseling:  The patient was given appropriate handouts, self care instructions, and instructed in symptomatic relief.    3.  Follow up:  The patient was told to follow up here if no better in 3 to 4 days, or sooner if becoming worse in any way, and given some red flag symptoms such as worsening pain, fever, persistent vomiting, or heavy vaginal bleeding which would prompt immediate return.       Reuben Likesavid C Verdine Grenfell, MD 09/17/13 [phone]

## 2013-09-17 NOTE — Discharge Instructions (Signed)

## 2013-09-18 LAB — CERVICOVAGINAL ANCILLARY ONLY
Chlamydia: NEGATIVE
NEISSERIA GONORRHEA: POSITIVE — AB
WET PREP (BD AFFIRM): NEGATIVE
WET PREP (BD AFFIRM): POSITIVE — AB
Wet Prep (BD Affirm): NEGATIVE

## 2013-09-19 ENCOUNTER — Telehealth (HOSPITAL_COMMUNITY): Payer: Self-pay | Admitting: *Deleted

## 2013-09-19 NOTE — ED Notes (Addendum)
GC pos. Chlamydia neg., Affirm: Candida pos., Gardnerella and Trich neg., HIV/RPR non-reactive.  I called pt. Pt. verified x 2 and given results.  Pt. told she needs to come back in to be treated with a shot of Rocephin and Zithromax and will need a Rx. for treatment of Candida also. Savannah Palmer, Savannah Palmer 09/19/2013 Pt. has not come in for tx. Left message to call.  Call 2. 09/23/2013 I asked friend to have pt. to call me.  Call 3. Pt. called back and said she is coming. She has been very busy this week. 09/24/2013 .

## 2013-09-29 NOTE — ED Notes (Addendum)
[facility]. 09/29/2013 [facility] and said she talked to the Savannah Palmer. on 4/15 and she said she is coming here for treatment.  I told Brandi, that I called Savannah Palmer. yesterday and left a message and she still has not come today. 10/02/2013

## 2013-10-11 NOTE — ED Notes (Signed)
4/16 Left message. Call 4. 4/21 Left message. Call 5. 4/26 Not reachable by phone. Call 6. Letter sent to come back for treatment ASAP. Savannah Palmer

## 2013-10-16 NOTE — ED Notes (Signed)
Pt. called on VM on 1351 and 1403 asking me to fax a copy of the letter I sent her. She is going to the doctor.  I called back and left a message telling her I could not fax it because I don't know where I'm sending it.  If she comes back here we would only charge her for the medication and the administration of the medication.  This may be cheaper for her than a doctors visit. Savannah Palmer 10/16/2013

## 2014-02-14 ENCOUNTER — Encounter (HOSPITAL_COMMUNITY): Payer: Self-pay | Admitting: Emergency Medicine

## 2014-02-14 ENCOUNTER — Emergency Department (HOSPITAL_COMMUNITY)
Admission: EM | Admit: 2014-02-14 | Discharge: 2014-02-14 | Disposition: A | Discharge status: Home / Self Care | Payer: 59 | Attending: Emergency Medicine | Admitting: Emergency Medicine

## 2014-02-14 DIAGNOSIS — F19930 Other psychoactive substance use, unspecified with withdrawal, uncomplicated: Secondary | ICD-10-CM

## 2014-02-14 DIAGNOSIS — F3289 Other specified depressive episodes: Secondary | ICD-10-CM | POA: Insufficient documentation

## 2014-02-14 DIAGNOSIS — F1920 Other psychoactive substance dependence, uncomplicated: Secondary | ICD-10-CM | POA: Diagnosis not present

## 2014-02-14 DIAGNOSIS — Z79899 Other long term (current) drug therapy: Secondary | ICD-10-CM | POA: Insufficient documentation

## 2014-02-14 DIAGNOSIS — F17200 Nicotine dependence, unspecified, uncomplicated: Secondary | ICD-10-CM | POA: Insufficient documentation

## 2014-02-14 DIAGNOSIS — F411 Generalized anxiety disorder: Secondary | ICD-10-CM | POA: Diagnosis not present

## 2014-02-14 DIAGNOSIS — F19939 Other psychoactive substance use, unspecified with withdrawal, unspecified: Principal | ICD-10-CM | POA: Insufficient documentation

## 2014-02-14 DIAGNOSIS — F329 Major depressive disorder, single episode, unspecified: Secondary | ICD-10-CM | POA: Diagnosis not present

## 2014-02-14 DIAGNOSIS — F19230 Other psychoactive substance dependence with withdrawal, uncomplicated: Secondary | ICD-10-CM

## 2014-02-14 HISTORY — DX: Depression, unspecified: F32.A

## 2014-02-14 HISTORY — DX: Major depressive disorder, single episode, unspecified: F32.9

## 2014-02-14 MED ORDER — PAROXETINE HCL 30 MG PO TABS: 30.0000 mg | ORAL_TABLET | Freq: Every day | ORAL | Status: DC

## 2014-02-14 MED ORDER — ONDANSETRON HCL 4 MG PO TABS: 4.0000 mg | ORAL_TABLET | Freq: Three times a day (TID) | ORAL | Status: DC | PRN

## 2014-02-14 MED ORDER — IBUPROFEN 400 MG PO TABS: 400.0000 mg | ORAL_TABLET | Freq: Four times a day (QID) | ORAL | Status: DC | PRN

## 2014-02-14 MED ORDER — PAROXETINE HCL 30 MG PO TABS
30.0000 mg | ORAL_TABLET | Freq: Every day | ORAL | Status: DC
  Administered 2014-02-14: 30 mg via ORAL
  Filled 2014-02-14 (×2): qty 1

## 2014-02-14 NOTE — Discharge Instructions (Signed)
Chemical Dependency  Chemical dependency is an addiction to drugs or alcohol. It is characterized by the repeated behavior of seeking out and using drugs and alcohol despite harmful consequences to the health and safety of ones self and others.   RISK FACTORS  There are certain situations or behaviors that increase a person's risk for chemical dependency. These include:  · A family history of chemical dependency.  · A history of mental health issues, including depression and anxiety.  · A home environment where drugs and alcohol are easily available to you.  · Drug or alcohol use at a young age.  SYMPTOMS   The following symptoms can indicate chemical dependency:  · Inability to limit the use of drugs or alcohol.  · Nausea, sweating, shakiness, and anxiety that occurs when alcohol or drugs are not being used.  · An increase in amount of drugs or alcohol that is necessary to get drunk or high.  People who experience these symptoms can assess their use of drugs and alcohol by asking themselves the following questions:  · Have you been told by friends or family that they are worried about your use of alcohol or drugs?  · Do friends and family ever tell you about things you did while drinking alcohol or using drugs that you do not remember?  · Do you lie about using alcohol or drugs or about the amounts you use?  · Do you have difficulty completing daily tasks unless you use alcohol or drugs?  · Is the level of your work or school performance lower because of your drug or alcohol use?  · Do you get sick from using drugs or alcohol but keep using anyway?  · Do you feel uncomfortable in social situations unless you use alcohol or drugs?  · Do you use drugs or alcohol to help forget problems?   An answer of yes to any of these questions may indicate chemical dependency. Professional evaluation is suggested.  Document Released: 05/29/2001 Document Revised: 08/27/2011 Document Reviewed: 08/10/2010  Exit[employer]® Patient  Information ©2015 [employer]. This information is not intended to replace advice given to you by your health care provider. Make sure you discuss any questions you have with your health care provider.

## 2014-02-14 NOTE — ED Provider Notes (Signed)
CSN: [id number]     Arrival date & time 02/14/14  0658 History   None    Chief Complaint  Patient presents with  . Drug Problem   HPI Savannah Palmer is a 23 yo woman with a history of anxiety who is presenting with "withdrawal symptoms" from discontinued use of Paxil". She states that she treated her anxiety with Xanax or Lexapro for years; then, several months ago, due to inpatient rehab in [place], she switched to Paxil and gabapentin. Her last dose of Paxil was 2 days ago ( ) at that facility. She has not yet re-established care in [place] for a new prescription. Last night, she started feeling anxious, short of breath, light-headed, heavy and developed sore legs. She was unable to sleep. She denies SI or HI.   Past Medical History  Diagnosis Date  . Anxiety   . Anxiety   . Depression    Past Surgical History  Procedure Laterality Date  . Cholecystectomy     History reviewed. No pertinent family history. History  Substance Use Topics  . Smoking status: Current Some Day Smoker  . Smokeless tobacco: Not on file  . 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; last 60 days ago OB History   Grav Para Term Preterm Abortions TAB SAB Ect Mult Living                 Review of Systems General: has felt well, no recent illness; currently feels "heavy" Skin: no rashes or lesions HEENT: no headaches, light-headed Cardiac: no chest pain, no palpitations Respiratory: shortness of breath GI: no abdominal pain, no changes in BMs Urinary: no changes in urination Msk: sore legs, equal Endocrine: no temperature intolerance, no weight changes Psychiatric: history of anxiety and depression, currently some depressed mood, decreased ability to sleep, increased and decreased appetite, feelings of worthlessness, no SI/HI   Allergies  Review of patient's allergies indicates no known allergies.  Home Medications   Prior to  Admission medications   Medication Sig Start Date End Date Taking? Authorizing Provider  ALPRAZolam Prudy Feeler) 0.5 MG tablet Take 0.5 mg by mouth at bedtime as needed.    Historical Provider, MD  clonazePAM (KLONOPIN) 0.5 MG tablet Take 1 tablet (0.5 mg total) by mouth 3 (three) times daily as needed for anxiety. 09/17/13   Reuben Likes, MD  escitalopram (LEXAPRO) 20 MG tablet Take 1 tablet (20 mg total) by mouth daily. 09/17/13   Reuben Likes, MD   BP 134/90  Temp(Src) 98.1 F (36.7 C) (Oral)  Resp 18  SpO2 98% Physical Exam Appearance: in NAD, lying in bed with friend at bedside HEENT: AT/[place], mydriasis with 8mm pupils b/l, reactive to light and accommodation, EOMi, no lymphadenopathy, no rhinorrhea, no lacrimation Heart: tachycardic, regular rhythm, no MRG Lungs: CTAB, no wheezes Abdomen: hyperactive bowel sounds, soft, nontender Musculoskeletal: nontender Extremities: no edema b/l Neurologic: A&Ox3, no altered speech Psychiatric: anxious, no behavioral signs of intoxication Skin: no rashes or lesions  ED Course  Procedures (including critical care time) Labs Review Labs Reviewed - No data to display  Imaging Review No results found.   EKG Interpretation None      MDM   Final diagnoses:  Paroxetine withdrawal syndrome, uncomplicated    Ms. Savannah Palmer is a 23 yo woman who reports that she is withdrawing from Paxil, which she has been using to control her anxiety. Her anxiety, shortness of breath and  lower extremity myalgias fit with known discontinuation effects of Paxil (with its short half life, 21 hours). Her exam is notable for tachycardia, increased bowel sounds and mydriasis. She was restarted on Paxil in the hospital and was given a 7 day prescription for it to cover her until she re-establishes care in the area. She was also given prescriptions for advil and zofran to manage her symptoms.  Dionne Ano, MD 02/14/14 [phone]

## 2014-02-14 NOTE — ED Notes (Addendum)
Pt states she was on benzodiazapines and was trying to come off of them for the past couple of months. She was visiting her mother in [place] for a couple of months and now she is home and ran out of meds. She states she was taking gabapentin and Paxil and now is not and feels disoriented.She also c/o some chest pain and shortness of breath along with the increased anxiety. Pt alert and oriented at this time.

## 2014-02-14 NOTE — ED Provider Notes (Signed)
I saw and evaluated the patient, reviewed the resident's note and I agree with the findings and plan.   EKG Interpretation None      Pt comes in with some anxiety, irritability, body aches, nausea. Paxil stopped abruptly. Filed Vitals:   02/14/14 0900  BP: 126/80  Pulse: 110  Temp:   Resp:    ao x 3. Will d/c. Pt to slowly wean herself off of paxil. PCP f/u requested for proper management.   Derwood Kaplan, MD 02/14/14 1731

## 2015-06-28 MED FILL — SUBOXONE 8 MG-2 MG SL FILM: 8-2 | 7 days supply | Qty: 14 | Fill #0

## 2015-07-05 MED FILL — SUBOXONE 8 MG-2 MG SL FILM: 8-2 | 7 days supply | Qty: 14 | Fill #0

## 2015-07-12 MED FILL — SUBOXONE 8 MG-2 MG SL FILM: 8-2 | 7 days supply | Qty: 14 | Fill #0

## 2015-07-19 MED FILL — SUBOXONE 8 MG-2 MG SL FILM: 8-2 | 7 days supply | Qty: 14 | Fill #0

## 2015-07-26 MED FILL — SUBOXONE 8 MG-2 MG SL FILM: 8-2 | 7 days supply | Qty: 14 | Fill #0

## 2015-08-02 MED FILL — SUBOXONE 8 MG-2 MG SL FILM: 8-2 | 7 days supply | Qty: 14 | Fill #0

## 2015-08-09 MED FILL — SUBOXONE 8 MG-2 MG SL FILM: 8-2 | 7 days supply | Qty: 14 | Fill #0

## 2015-08-16 MED FILL — SUBOXONE 8 MG-2 MG SL FILM: 8-2 | 7 days supply | Qty: 14 | Fill #0

## 2015-08-23 MED FILL — SUBOXONE 8 MG-2 MG SL FILM: 8-2 | 7 days supply | Qty: 14 | Fill #0

## 2015-08-30 MED FILL — SUBOXONE 8 MG-2 MG SL FILM: 8-2 | 7 days supply | Qty: 14 | Fill #0

## 2015-09-06 MED FILL — SUBOXONE 8 MG-2 MG SL FILM: 8-2 | 7 days supply | Qty: 14 | Fill #0

## 2015-09-13 MED FILL — SUBOXONE 8 MG-2 MG SL FILM: 8-2 | 7 days supply | Qty: 14 | Fill #0

## 2015-09-20 MED FILL — SUBOXONE 8 MG-2 MG SL FILM: 8-2 | 7 days supply | Qty: 14 | Fill #0

## 2015-09-27 MED FILL — SUBOXONE 8 MG-2 MG SL FILM: 8-2 | 7 days supply | Qty: 14 | Fill #0

## 2015-10-07 MED FILL — SUBOXONE 8 MG-2 MG SL FILM: 8-2 | 7 days supply | Qty: 14 | Fill #0

## 2015-10-12 MED FILL — SUBOXONE 8 MG-2 MG SL FILM: 8-2 | 3 days supply | Qty: 7 | Fill #1

## 2015-11-18 MED FILL — SUBOXONE 8 MG-2 MG SL FILM: 8-2 | 7 days supply | Qty: 14 | Fill #0

## 2015-11-25 MED FILL — SUBOXONE 8 MG-2 MG SL FILM: 8-2 | 7 days supply | Qty: 14 | Fill #0

## 2015-12-02 MED FILL — SUBOXONE 8 MG-2 MG SL FILM: 8-2 | 7 days supply | Qty: 14 | Fill #0

## 2015-12-09 MED FILL — SUBOXONE 8 MG-2 MG SL FILM: 8-2 | 4 days supply | Qty: 8 | Fill #0

## 2015-12-12 MED FILL — SUBOXONE 8 MG-2 MG SL FILM: 8-2 | 3 days supply | Qty: 6 | Fill #1

## 2015-12-16 MED FILL — ZUBSOLV 5.7-1.4 MG TAB SL: 5.7-1.4 | 7 days supply | Qty: 14 | Fill #0

## 2015-12-23 MED FILL — ZUBSOLV 5.7-1.4 MG TAB SL: 5.7-1.4 | 7 days supply | Qty: 14 | Fill #0

## 2015-12-30 MED FILL — ZUBSOLV 5.7-1.4 MG TAB SL: 5.7-1.4 | 7 days supply | Qty: 14 | Fill #0

## 2016-01-12 ENCOUNTER — Observation Stay (HOSPITAL_COMMUNITY)
Admission: EM | Admit: 2016-01-12 | Discharge: 2016-01-19 | Payer: BLUE CROSS/BLUE SHIELD | Attending: Family Medicine | Admitting: Family Medicine

## 2016-01-12 ENCOUNTER — Encounter (HOSPITAL_COMMUNITY): Payer: Self-pay | Admitting: Emergency Medicine

## 2016-01-12 ENCOUNTER — Emergency Department (HOSPITAL_COMMUNITY): Payer: BLUE CROSS/BLUE SHIELD

## 2016-01-12 DIAGNOSIS — R011 Cardiac murmur, unspecified: Secondary | ICD-10-CM | POA: Diagnosis present

## 2016-01-12 DIAGNOSIS — B1920 Unspecified viral hepatitis C without hepatic coma: Secondary | ICD-10-CM | POA: Insufficient documentation

## 2016-01-12 DIAGNOSIS — F329 Major depressive disorder, single episode, unspecified: Secondary | ICD-10-CM | POA: Diagnosis not present

## 2016-01-12 DIAGNOSIS — E876 Hypokalemia: Secondary | ICD-10-CM | POA: Diagnosis not present

## 2016-01-12 DIAGNOSIS — N179 Acute kidney failure, unspecified: Secondary | ICD-10-CM | POA: Insufficient documentation

## 2016-01-12 DIAGNOSIS — F410 Panic disorder [episodic paroxysmal anxiety] without agoraphobia: Secondary | ICD-10-CM | POA: Diagnosis not present

## 2016-01-12 DIAGNOSIS — R32 Unspecified urinary incontinence: Secondary | ICD-10-CM | POA: Insufficient documentation

## 2016-01-12 DIAGNOSIS — G92 Toxic encephalopathy: Secondary | ICD-10-CM | POA: Diagnosis not present

## 2016-01-12 DIAGNOSIS — F1123 Opioid dependence with withdrawal: Secondary | ICD-10-CM | POA: Insufficient documentation

## 2016-01-12 DIAGNOSIS — F1920 Other psychoactive substance dependence, uncomplicated: Secondary | ICD-10-CM | POA: Diagnosis present

## 2016-01-12 DIAGNOSIS — F13239 Sedative, hypnotic or anxiolytic dependence with withdrawal, unspecified: Secondary | ICD-10-CM | POA: Insufficient documentation

## 2016-01-12 DIAGNOSIS — F1910 Other psychoactive substance abuse, uncomplicated: Secondary | ICD-10-CM

## 2016-01-12 DIAGNOSIS — T50904A Poisoning by unspecified drugs, medicaments and biological substances, undetermined, initial encounter: Secondary | ICD-10-CM | POA: Diagnosis not present

## 2016-01-12 DIAGNOSIS — F419 Anxiety disorder, unspecified: Secondary | ICD-10-CM | POA: Insufficient documentation

## 2016-01-12 DIAGNOSIS — F1120 Opioid dependence, uncomplicated: Secondary | ICD-10-CM

## 2016-01-12 DIAGNOSIS — R768 Other specified abnormal immunological findings in serum: Secondary | ICD-10-CM

## 2016-01-12 DIAGNOSIS — R4182 Altered mental status, unspecified: Secondary | ICD-10-CM

## 2016-01-12 DIAGNOSIS — F1990 Other psychoactive substance use, unspecified, uncomplicated: Secondary | ICD-10-CM | POA: Diagnosis present

## 2016-01-12 LAB — ACETAMINOPHEN LEVEL: Acetaminophen (Tylenol), Serum: <10 ug/mL — ABNORMAL LOW (ref 10–30)

## 2016-01-12 LAB — COMPREHENSIVE METABOLIC PANEL
ALT: 14 U/L (ref 14–54)
AST: 27 U/L (ref 15–41)
Albumin: 5.5 g/dL — ABNORMAL HIGH (ref 3.5–5.0)
Alkaline Phosphatase: 108 U/L (ref 38–126)
Anion gap: 16 — ABNORMAL HIGH (ref 5–15)
BUN: 36 mg/dL — ABNORMAL HIGH (ref 6–20)
CHLORIDE: 97 mmol/L — AB (ref 101–111)
CO2: 27 mmol/L (ref 22–32)
Calcium: 11 mg/dL — ABNORMAL HIGH (ref 8.9–10.3)
Creatinine, Ser: 1.5 mg/dL — ABNORMAL HIGH (ref 0.44–1.00)
GFR, EST AFRICAN AMERICAN: 55 mL/min — AB (ref >60)
GFR, EST NON AFRICAN AMERICAN: 48 mL/min — AB (ref >60)
Glucose, Bld: 143 mg/dL — ABNORMAL HIGH (ref 65–99)
POTASSIUM: 3.3 mmol/L — AB (ref 3.5–5.1)
Sodium: 140 mmol/L (ref 135–145)
Total Bilirubin: 0.8 mg/dL (ref 0.3–1.2)
Total Protein: 9.6 g/dL — ABNORMAL HIGH (ref 6.5–8.1)

## 2016-01-12 LAB — URINALYSIS, ROUTINE W REFLEX MICROSCOPIC
Glucose, UA: NEGATIVE mg/dL
Ketones, ur: 15 mg/dL — AB
NITRITE: NEGATIVE
PH: 6 (ref 5.0–8.0)
Protein, ur: 100 mg/dL — AB
SPECIFIC GRAVITY, URINE: 1.025 (ref 1.005–1.030)

## 2016-01-12 LAB — CBC WITH DIFFERENTIAL/PLATELET
BASOS ABS: 0 10*3/uL (ref 0.0–0.1)
Basophils Relative: 0 %
EOS ABS: 0 10*3/uL (ref 0.0–0.7)
EOS PCT: 0 %
HCT: 48.4 % — ABNORMAL HIGH (ref 36.0–46.0)
Hemoglobin: 17.1 g/dL — ABNORMAL HIGH (ref 12.0–15.0)
LYMPHS ABS: 1.7 10*3/uL (ref 0.7–4.0)
Lymphocytes Relative: 9 %
MCH: 29.7 pg (ref 26.0–34.0)
MCHC: 35.3 g/dL (ref 30.0–36.0)
MCV: 84.2 fL (ref 78.0–100.0)
Monocytes Absolute: 1.6 10*3/uL — ABNORMAL HIGH (ref 0.1–1.0)
Monocytes Relative: 9 %
Neutro Abs: 15.3 10*3/uL — ABNORMAL HIGH (ref 1.7–7.7)
Neutrophils Relative %: 82 %
PLATELETS: 374 10*3/uL (ref 150–400)
RBC: 5.75 MIL/uL — AB (ref 3.87–5.11)
RDW: 13.2 % (ref 11.5–15.5)
WBC: 18.6 10*3/uL — AB (ref 4.0–10.5)

## 2016-01-12 LAB — URINE MICROSCOPIC-ADD ON

## 2016-01-12 LAB — I-STAT BETA HCG BLOOD, ED (MC, WL, AP ONLY)

## 2016-01-12 LAB — I-STAT TROPONIN, ED: Troponin i, poc: 0.01 ng/mL (ref 0.00–0.08)

## 2016-01-12 LAB — I-STAT CG4 LACTIC ACID, ED: LACTIC ACID, VENOUS: 2.41 mmol/L — AB (ref 0.5–1.9)

## 2016-01-12 LAB — ETHANOL

## 2016-01-12 LAB — SALICYLATE LEVEL

## 2016-01-12 LAB — CBG MONITORING, ED: GLUCOSE-CAPILLARY: 150 mg/dL — AB (ref 65–99)

## 2016-01-12 MED ORDER — SODIUM CHLORIDE 0.9 % IV BOLUS (SEPSIS)
1000.0000 mL | Freq: Once | INTRAVENOUS | Status: AC
  Administered 2016-01-12: 1000 mL via INTRAVENOUS

## 2016-01-12 MED ORDER — SODIUM CHLORIDE 0.9 % IV SOLN
1000.0000 mL | Freq: Once | INTRAVENOUS | Status: AC
  Administered 2016-01-12: 1000 mL via INTRAVENOUS

## 2016-01-12 NOTE — ED Triage Notes (Signed)
Pt to ED via [facility] with reports of having a seizure.  EMS st's they were told by pt's mother that pt had a seizure and was incontinent of stool.

## 2016-01-12 NOTE — ED Notes (Signed)
Patient had vomited on self. Reports that she does not remember. Denies nausea. RN cleaned patient, changed gown, sheets, replaced chux pads. Resting comfortably at this time. Emesis bag provided to patient.

## 2016-01-12 NOTE — ED Notes (Signed)
Patient transported to CT 

## 2016-01-12 NOTE — ED Notes (Signed)
Provided ginger ale - per Dr. Erin Hearing.

## 2016-01-12 NOTE — ED Provider Notes (Signed)
[facility]-EMERGENCY DEPT Provider Note   CSN: [id number] Arrival date & time: 01/12/16  1605  First Provider Contact:  None       History   Chief Complaint Chief Complaint  Patient presents with  . Seizures    HPI Savannah Palmer is a 25 y.o. female.  HPI   Pt with hx polysubstance abuse brought in by EMS after parents found her altered, excessively sleepy, defecating on herself since 5am.  Pt tells me she is withdrawing from drugs and feels bad from drugs - says she uses heroin, xanax, suboxone, occasional cocaine - changes her story frequently regarding last use.  States she used today, yesterday, and also that she hasn't had drugs in 3 days.  Pt reports chest pain and bilateral leg pain.  Denies ETOH use.  Father states patient asked her mother to come pick her up two nights ago and has stayed with them since.  Has been sleeping almost constantly since she arrived.  Yesterday morning he saw her up and walking to the kitchen, smiled at him and acknowledged him.  He has had no other interaction with her until he found her at 5am in her bed with feces around her.   Level V caveat for confusion.   Past Medical History:  Diagnosis Date  . Anxiety   . Anxiety   . Depression     There are no active problems to display for this patient.   Past Surgical History:  Procedure Laterality Date  . CHOLECYSTECTOMY      OB History    No data available       Home Medications    Prior to Admission medications   Medication Sig Start Date End Date Taking? Authorizing Provider  gabapentin (NEURONTIN) 300 MG capsule Take 300 mg by mouth 3 (three) times daily.    Historical Provider, MD  ibuprofen (ADVIL,MOTRIN) 400 MG tablet Take 1 tablet (400 mg total) by mouth every 6 (six) hours as needed. 02/14/14   Stark Bray, MD  ondansetron (ZOFRAN) 4 MG tablet Take 1 tablet (4 mg total) by mouth every 8 (eight) hours as needed for nausea or vomiting. 02/14/14   Stark Bray, MD    PARoxetine (PAXIL) 30 MG tablet Take 30 mg by mouth daily.    Historical Provider, MD  PARoxetine (PAXIL) 30 MG tablet Take 1 tablet (30 mg total) by mouth daily. 02/14/14   Stark Bray, MD    Family History No family history on file.  Social History Social History  Substance Use Topics  . Smoking status: Current Some Day Smoker  . Smokeless tobacco: Not on file  . Alcohol use Yes     Comment: occasinally socially     Allergies   Review of patient's allergies indicates no known allergies.   Review of Systems Review of Systems  Unable to perform ROS: Mental status change     Physical Exam Updated Vital Signs BP 122/73 (BP Location: Right Arm)   Pulse (!) 57   Temp 98.9 F (37.2 C) (Oral)   Resp 16   SpO2 95%   Physical Exam  Constitutional: She appears well-developed and well-nourished. No distress.  HENT:  Head: Normocephalic and atraumatic.  Neck: Neck supple.  Cardiovascular: Normal rate and regular rhythm.   Pulmonary/Chest: Effort normal and breath sounds normal. No respiratory distress. She has no wheezes. She has no rales.  Abdominal: Soft. She exhibits no distension. There is no tenderness. There is no rebound and  no guarding.  Genitourinary:  Genitourinary Comments: Stool on stretcher   Musculoskeletal: She exhibits no edema.  Neurological: She is alert.  Confused.  Does not answer where she thinks she is.  Thinks is 2012.  Unable to name the day or the month.  Knows the president.    Skin: Rash (erythematous rash over right shoulder and right chest wall ) noted. She is not diaphoretic.  Nursing note and vitals reviewed.    ED Treatments / Results  Labs (all labs ordered are listed, but only abnormal results are displayed) Labs Reviewed  CBC WITH DIFFERENTIAL/PLATELET - Abnormal; Notable for the following:       Result Value   WBC 18.6 (*)    RBC 5.75 (*)    Hemoglobin 17.1 (*)    HCT 48.4 (*)    Neutro Abs 15.3 (*)    Monocytes Absolute  1.6 (*)    All other components within normal limits  COMPREHENSIVE METABOLIC PANEL - Abnormal; Notable for the following:    Potassium 3.3 (*)    Chloride 97 (*)    Glucose, Bld 143 (*)    BUN 36 (*)    Creatinine, Ser 1.50 (*)    Calcium 11.0 (*)    Total Protein 9.6 (*)    Albumin 5.5 (*)    GFR calc non Af Amer 48 (*)    GFR calc Af Amer 55 (*)    Anion gap 16 (*)    All other components within normal limits  CBG MONITORING, ED - Abnormal; Notable for the following:    Glucose-Capillary 150 (*)    All other components within normal limits  I-STAT CG4 LACTIC ACID, ED - Abnormal; Notable for the following:    Lactic Acid, Venous 2.41 (*)    All other components within normal limits  CULTURE, BLOOD (ROUTINE X 2)  CULTURE, BLOOD (ROUTINE X 2)  URINALYSIS, ROUTINE W REFLEX MICROSCOPIC (NOT AT [facility])  URINE RAPID DRUG SCREEN, HOSP PERFORMED  ETHANOL  SALICYLATE LEVEL  ACETAMINOPHEN LEVEL  I-STAT BETA HCG BLOOD, ED ([facility], [facility], [facility] ONLY)  I-STAT TROPOININ, ED    EKG  EKG Interpretation None       Radiology Dg Chest 2 View  Result Date: 01/12/2016 CLINICAL DATA:  Altered mental status. Patient has not taken her anxiety medicine in 1 week. EXAM: CHEST  2 VIEW COMPARISON:  08/07/2010 FINDINGS: Normal heart, mediastinum and hila. Clear lungs.  No pleural effusion or pneumothorax. Skeletal structures are unremarkable. IMPRESSION: Normal chest radiographs. Electronically Signed   By: Amie Portland M.D.   On: 01/12/2016 18:49   Procedures Procedures (including critical care time)  Medications Ordered in ED Medications  sodium chloride 0.9 % bolus 1,000 mL (not administered)  0.9 %  sodium chloride infusion (1,000 mLs Intravenous New Bag/Given 01/12/16 1903)     Initial Impression / Assessment and Plan / ED Course  I have reviewed the triage vital signs and the nursing notes.  Pertinent labs & imaging results that were available during my care of the patient were reviewed by  me and considered in my medical decision making (see chart for details).  Clinical Course  ' Father is Reniya Michelini [phone].    Patient with hx polysubstance abuse, seizures from withdrawal brought in to ED after being excessively sleepy, defecating in her bed several times.  Father notes last time she was this altered she had seized from withdrawal from drugs. Pt altered, unable to accurately report recent events.  Labs significant for lactic acidosis, leukocytosis, elevated Hgb, anion gap slightly elevated, hypokalemia, renal insufficiency.  She is clinically dehydrated.  Pt discussed with Dr Clayborne Dana who also saw and examined the patient, hears a new murmur.  Concern for endocarditis.  Blood cultures have been drawn.  Unclear if patient is in withdrawal, is postictal, is intoxicated, or is ill with endocarditis or other process.  Patient to be admitted to the hospital, signed out to Langston Masker, PA-C, at change of shift pending head CT results prior to admission.    Final Clinical Impressions(s) / ED Diagnoses   Final diagnoses:  Altered mental status, unspecified altered mental status type  Polysubstance abuse  Heart murmur    New Prescriptions New Prescriptions   No medications on file     Trixie Dredge, PA-C 01/12/16 2022    Marily Memos, MD 01/12/16 2233

## 2016-01-12 NOTE — ED Notes (Signed)
Patient placed on bedpan in attempt to gain urine for specimen.

## 2016-01-12 NOTE — ED Provider Notes (Signed)
Medical screening examination/treatment/procedure(s) were conducted as a shared visit with non-physician practitioner(s) and myself.  I personally evaluated the patient during the encounter.  Here for AMS, possibly seizures. But also incontinence per family. They also stated she may be withdrawing.  On my exam she gives multiple accounts of what happened, none of which are the same.  Alert but oriented to self and place only. Not date or situation.  Heart with a 2/6 systolic murmur. Track marks on both arms. Lungs clear. Neuro exam otherwise nonfocal.  Concern for endocarditis v infection v post ictal. Plan for workup and disposition appropriately, likely admission for echo.   My ECG Read Indication: AMS EKG was personally contemporaneously reviewed by myself. Rate: 60 PR Interval: 123 QRS duration: 88 QT/QTC: 479/479 Axis: normal EKG: normal EKG, normal sinus rhythm. Other significant findings: none   Marily Memos, MD 01/13/16 [phone]

## 2016-01-12 NOTE — ED Notes (Signed)
No seizure activity noted.  Pt resting with eyes opened.  Attempting to obtain u/a

## 2016-01-12 NOTE — ED Notes (Signed)
Seizure pads placed on stretcher for safety

## 2016-01-12 NOTE — ED Notes (Signed)
PT continues to be alert by slightly confused.

## 2016-01-13 ENCOUNTER — Other Ambulatory Visit (HOSPITAL_COMMUNITY): Payer: Self-pay

## 2016-01-13 DIAGNOSIS — R4182 Altered mental status, unspecified: Secondary | ICD-10-CM

## 2016-01-13 DIAGNOSIS — F1120 Opioid dependence, uncomplicated: Secondary | ICD-10-CM

## 2016-01-13 DIAGNOSIS — R011 Cardiac murmur, unspecified: Secondary | ICD-10-CM | POA: Diagnosis present

## 2016-01-13 DIAGNOSIS — F1920 Other psychoactive substance dependence, uncomplicated: Secondary | ICD-10-CM | POA: Diagnosis present

## 2016-01-13 DIAGNOSIS — N179 Acute kidney failure, unspecified: Secondary | ICD-10-CM | POA: Diagnosis not present

## 2016-01-13 DIAGNOSIS — F1990 Other psychoactive substance use, unspecified, uncomplicated: Secondary | ICD-10-CM

## 2016-01-13 LAB — BASIC METABOLIC PANEL
Anion gap: 13 (ref 5–15)
BUN: 33 mg/dL — ABNORMAL HIGH (ref 6–20)
CALCIUM: 10.1 mg/dL (ref 8.9–10.3)
CO2: 27 mmol/L (ref 22–32)
CREATININE: 1.16 mg/dL — AB (ref 0.44–1.00)
Chloride: 101 mmol/L (ref 101–111)
GFR calc non Af Amer: >60 mL/min (ref >60)
GLUCOSE: 137 mg/dL — AB (ref 65–99)
Potassium: 3.5 mmol/L (ref 3.5–5.1)
Sodium: 141 mmol/L (ref 135–145)

## 2016-01-13 LAB — RAPID URINE DRUG SCREEN, HOSP PERFORMED
AMPHETAMINES: NOT DETECTED
Barbiturates: NOT DETECTED
Benzodiazepines: NOT DETECTED
COCAINE: POSITIVE — AB
OPIATES: POSITIVE — AB
Tetrahydrocannabinol: NOT DETECTED

## 2016-01-13 LAB — CBC
HEMATOCRIT: 44.1 % (ref 36.0–46.0)
Hemoglobin: 15 g/dL (ref 12.0–15.0)
MCH: 29.7 pg (ref 26.0–34.0)
MCHC: 34 g/dL (ref 30.0–36.0)
MCV: 87.3 fL (ref 78.0–100.0)
Platelets: 360 10*3/uL (ref 150–400)
RBC: 5.05 MIL/uL (ref 3.87–5.11)
RDW: 13.3 % (ref 11.5–15.5)
WBC: 15.5 10*3/uL — ABNORMAL HIGH (ref 4.0–10.5)

## 2016-01-13 MED ORDER — CLONIDINE HCL 0.1 MG PO TABS: 0.1000 mg | ORAL_TABLET | Freq: Every day | ORAL | Status: DC

## 2016-01-13 MED ORDER — ONDANSETRON HCL 4 MG PO TABS: 4.0000 mg | ORAL_TABLET | Freq: Three times a day (TID) | ORAL | Status: DC | PRN

## 2016-01-13 MED ORDER — HYDROXYZINE HCL 25 MG PO TABS
25.0000 mg | ORAL_TABLET | Freq: Four times a day (QID) | ORAL | Status: AC | PRN
  Administered 2016-01-13 – 2016-01-17 (×3): 25 mg via ORAL
  Filled 2016-01-13 (×4): qty 1

## 2016-01-13 MED ORDER — CLONIDINE HCL 0.1 MG PO TABS: 0.1000 mg | ORAL_TABLET | ORAL | Status: DC

## 2016-01-13 MED ORDER — CLONIDINE HCL 0.1 MG PO TABS: 0.1000 mg | ORAL_TABLET | Freq: Four times a day (QID) | ORAL | Status: DC

## 2016-01-13 MED ORDER — FOLIC ACID 1 MG PO TABS
1.0000 mg | ORAL_TABLET | Freq: Every day | ORAL | Status: DC
  Administered 2016-01-13 – 2016-01-19 (×7): 1 mg via ORAL
  Filled 2016-01-13 (×7): qty 1

## 2016-01-13 MED ORDER — LOPERAMIDE HCL 2 MG PO CAPS: 2.0000 mg | ORAL_CAPSULE | ORAL | Status: AC | PRN

## 2016-01-13 MED ORDER — METHOCARBAMOL 500 MG PO TABS: 500.0000 mg | ORAL_TABLET | Freq: Three times a day (TID) | ORAL | Status: AC | PRN

## 2016-01-13 MED ORDER — IBUPROFEN 400 MG PO TABS
400.0000 mg | ORAL_TABLET | Freq: Four times a day (QID) | ORAL | Status: DC | PRN
Start: 2016-01-13 — End: 2016-01-13

## 2016-01-13 MED ORDER — LORAZEPAM 1 MG PO TABS
1.0000 mg | ORAL_TABLET | Freq: Four times a day (QID) | ORAL | Status: DC | PRN
  Administered 2016-01-15: 1 mg via ORAL
  Filled 2016-01-13: qty 1

## 2016-01-13 MED ORDER — GABAPENTIN 300 MG PO CAPS
300.0000 mg | ORAL_CAPSULE | Freq: Three times a day (TID) | ORAL | Status: DC
  Administered 2016-01-13: 300 mg via ORAL
  Filled 2016-01-13: qty 1

## 2016-01-13 MED ORDER — ADULT MULTIVITAMIN W/MINERALS CH
1.0000 | ORAL_TABLET | Freq: Every day | ORAL | Status: DC
  Administered 2016-01-13 – 2016-01-19 (×7): 1 via ORAL
  Filled 2016-01-13 (×7): qty 1

## 2016-01-13 MED ORDER — DICYCLOMINE HCL 20 MG PO TABS
20.0000 mg | ORAL_TABLET | Freq: Four times a day (QID) | ORAL | Status: AC | PRN
  Filled 2016-01-13: qty 1

## 2016-01-13 MED ORDER — NAPROXEN 250 MG PO TABS: 500.0000 mg | ORAL_TABLET | Freq: Two times a day (BID) | ORAL | Status: AC | PRN

## 2016-01-13 MED ORDER — THIAMINE HCL 100 MG/ML IJ SOLN: 100.0000 mg | Freq: Every day | INTRAMUSCULAR | Status: DC

## 2016-01-13 MED ORDER — ONDANSETRON 4 MG PO TBDP
4.0000 mg | ORAL_TABLET | Freq: Four times a day (QID) | ORAL | Status: AC | PRN
  Administered 2016-01-13: 4 mg via ORAL
  Filled 2016-01-13: qty 1

## 2016-01-13 MED ORDER — LORAZEPAM 2 MG/ML IJ SOLN
1.0000 mg | Freq: Four times a day (QID) | INTRAMUSCULAR | Status: DC | PRN
  Administered 2016-01-13 – 2016-01-14 (×3): 1 mg via INTRAVENOUS
  Filled 2016-01-13 (×3): qty 1

## 2016-01-13 MED ORDER — PAROXETINE HCL 20 MG PO TABS
30.0000 mg | ORAL_TABLET | Freq: Every day | ORAL | Status: DC
  Administered 2016-01-13: 30 mg via ORAL
  Filled 2016-01-13: qty 2

## 2016-01-13 MED ORDER — VITAMIN B-1 100 MG PO TABS
100.0000 mg | ORAL_TABLET | Freq: Every day | ORAL | Status: DC
  Administered 2016-01-13 – 2016-01-19 (×7): 100 mg via ORAL
  Filled 2016-01-13 (×7): qty 1

## 2016-01-13 MED ORDER — HEPARIN SODIUM (PORCINE) 5000 UNIT/ML IJ SOLN
5000.0000 [IU] | Freq: Three times a day (TID) | INTRAMUSCULAR | Status: DC
  Administered 2016-01-13 – 2016-01-19 (×19): 5000 [IU] via SUBCUTANEOUS
  Filled 2016-01-13 (×20): qty 1

## 2016-01-13 NOTE — Progress Notes (Signed)
PROGRESS NOTE  Savannah Palmer  ZOX:[id number] DOB: 30-Aug-1990 DOA: 01/12/2016 PCP: Default, Provider, MD  Brief Narrative:   Savannah Palmer is a 25 y.o. female with medical history significant of polysubstance abuse, IVDU.  Patient is brought in to ED by family due to AMS today.  There is initial question of possible withdrawal / withdrawal seizure.  Patient has incontinence at home.  Patient is very inconsistent in history giving to the ED providers during her stay in the ED and story keeps changing.  Assessment & Plan:   Principal Problem:   Murmur, heart Active Problems:   AKI (acute kidney injury) (HCC)   IVDU (intravenous drug user)   Polysubstance (including opioids) dependence, daily use (HCC)   Toxic encephalopathy secondary to polysubstance abuse -  D/c clonidine detox protocol this morning due to oversedation, resume once patient more alert -  Continue CIWA for benzo withdrawal -  Seizure precautions  AKI, likely due to cocaine use and improving with IVF  Heart murmur, new, in setting of IVDA -  BCx NGTD -  ECHO -  CT head negative for septic emboli  IVDA -  Check RPR, hepatitis B and C, and HIV  -  SW consult when more alert   DVT prophylaxis:  Heparin  Code Status:  full Family Communication:  Patient alone Disposition Plan:  Pending improvement in mentation   Consultants:   none  Procedures:  none  Antimicrobials:   none    Subjective: Unclear historian.  Initially says she has chest pain, but then points her to neck.  Tried to clarify if she meant she had a sore throat and she opened her mouth for exam.    Objective: Vitals:   01/13/16 0246 01/13/16 0510 01/13/16 0920 01/13/16 1231  BP: 100/64 (!) 109/55 109/65 109/62  Pulse: (!) 50 61 (!) 53 (!) 53  Resp: 18 18  19   Temp: 99.2 F (37.3 C) 98.5 F (36.9 C)  99.1 F (37.3 C)  TempSrc: Oral Oral  Oral  SpO2: 99% 98%  100%  Weight: 75.5 kg (166 lb 6.4 oz)     Height:  5\' 3"  (1.6 m)       Intake/Output Summary (Last 24 hours) at 01/13/16 1926 Last data filed at 01/13/16 0900  Gross per 24 hour  Intake              200 ml  Output                0 ml  Net              200 ml   Filed Weights   01/13/16 0246  Weight: 75.5 kg (166 lb 6.4 oz)    Examination:  General exam:  Adult female.  No acute distress.  HEENT:  NCAT, MMM, tonsils with petechial hemorrhage Respiratory system: Clear to auscultation bilaterally Cardiovascular system: Regular rate and rhythm, normal S1/S2. 1/6 murmur.  Warm extremities Gastrointestinal system: Normal active bowel sounds, soft, nondistended, nontender. MSK:  Normal tone and bulk, no lower extremity edema Neuro:  Grossly intact    Data Reviewed: I have personally reviewed following labs and imaging studies  CBC:  Recent Labs Lab 01/12/16 1823 01/13/16 0152  WBC 18.6* 15.5*  NEUTROABS 15.3*  --   HGB 17.1* 15.0  HCT 48.4* 44.1  MCV 84.2 87.3  PLT 374 360   Basic Metabolic Panel:  Recent Labs Lab 01/12/16 1823 01/13/16 0152  NA 140 141  K 3.3* 3.5  CL 97* 101  CO2 27 27  GLUCOSE 143* 137*  BUN 36* 33*  CREATININE 1.50* 1.16*  CALCIUM 11.0* 10.1   GFR: Estimated Creatinine Clearance: 72.1 mL/min (by C-G formula based on SCr of 1.16 mg/dL). Liver Function Tests:  Recent Labs Lab 01/12/16 1823  AST 27  ALT 14  ALKPHOS 108  BILITOT 0.8  PROT 9.6*  ALBUMIN 5.5*   No results for input(s): LIPASE, AMYLASE in the last 168 hours. No results for input(s): AMMONIA in the last 168 hours. Coagulation Profile: No results for input(s): INR, PROTIME in the last 168 hours. Cardiac Enzymes: No results for input(s): CKTOTAL, CKMB, CKMBINDEX, TROPONINI in the last 168 hours. BNP (last 3 results) No results for input(s): PROBNP in the last 8760 hours. HbA1C: No results for input(s): HGBA1C in the last 72 hours. CBG:  Recent Labs Lab 01/12/16 1808  GLUCAP 150*   Lipid Profile: No results for input(s):  CHOL, HDL, LDLCALC, TRIG, CHOLHDL, LDLDIRECT in the last 72 hours. Thyroid Function Tests: No results for input(s): TSH, T4TOTAL, FREET4, T3FREE, THYROIDAB in the last 72 hours. Anemia Panel: No results for input(s): VITAMINB12, FOLATE, FERRITIN, TIBC, IRON, RETICCTPCT in the last 72 hours. Urine analysis:    Component Value Date/Time   COLORURINE AMBER (A) 01/12/2016 2247   APPEARANCEUR CLOUDY (A) 01/12/2016 2247   LABSPEC 1.025 01/12/2016 2247   PHURINE 6.0 01/12/2016 2247   GLUCOSEU NEGATIVE 01/12/2016 2247   HGBUR TRACE (A) 01/12/2016 2247   BILIRUBINUR SMALL (A) 01/12/2016 2247   KETONESUR 15 (A) 01/12/2016 2247   PROTEINUR 100 (A) 01/12/2016 2247   UROBILINOGEN 0.2 08/05/2010 1821   NITRITE NEGATIVE 01/12/2016 2247   LEUKOCYTESUR SMALL (A) 01/12/2016 2247   Sepsis Labs: (procalcitonin:4,lacticidven:4)  ) Recent Results (from the past 240 hour(s))  Blood culture (routine x 2)     Status: None (Preliminary result)   Collection Time: 01/12/16  6:20 PM  Result Value Ref Range Status   Specimen Description BLOOD RIGHT ANTECUBITAL  Final   Special Requests BOTTLES DRAWN AEROBIC ONLY 5CC  Final   Culture NO GROWTH < 24 HOURS  Final   Report Status PENDING  Incomplete  Blood culture (routine x 2)     Status: None (Preliminary result)   Collection Time: 01/12/16  6:23 PM  Result Value Ref Range Status   Specimen Description BLOOD LEFT ARM  Final   Special Requests BOTTLES DRAWN AEROBIC ONLY 5CC  Final   Culture NO GROWTH < 24 HOURS  Final   Report Status PENDING  Incomplete      Radiology Studies: Dg Chest 2 View  Result Date: 01/12/2016 CLINICAL DATA:  Altered mental status. Patient has not taken her anxiety medicine in 1 week. EXAM: CHEST  2 VIEW COMPARISON:  08/07/2010 FINDINGS: Normal heart, mediastinum and hila. Clear lungs.  No pleural effusion or pneumothorax. Skeletal structures are unremarkable. IMPRESSION: Normal chest radiographs. Electronically Signed    By: Amie Portland M.D.   On: 01/12/2016 18:49  Ct Head Wo Contrast  Result Date: 01/12/2016 CLINICAL DATA:  Altered mental status. Seizure. Patient is unresponsive. EXAM: CT HEAD WITHOUT CONTRAST TECHNIQUE: Contiguous axial images were obtained from the base of the skull through the vertex without intravenous contrast. COMPARISON:  CT scan dated 08/02/2010. FINDINGS: No mass lesion. No midline shift. No acute hemorrhage or hematoma. No extra-axial fluid collections. No evidence of acute infarction. Brain parenchyma is normal. Bones are normal. IMPRESSION: Normal exam. Electronically Signed  By: Francene Boyers M.D.   On: 01/12/2016 20:22    Scheduled Meds: . folic acid  1 mg Oral Daily  . heparin  5,000 Units Subcutaneous Q8H  . multivitamin with minerals  1 tablet Oral Daily  . thiamine  100 mg Oral Daily   Continuous Infusions:    LOS: 0 days    Time spent: 30 min    Renae Fickle, MD [facility] Pager [phone]  If 7PM-7AM, please contact night-coverage [email or web address] Password [facility] 01/13/2016, 7:26 PM

## 2016-01-13 NOTE — H&P (Signed)
History and Physical    Savannah Palmer VQQ:[id number] DOB: [birth year] DOA: 01/12/2016   PCP: Default, Provider, MD Chief Complaint:  Chief Complaint  Patient presents with  . Seizures    HPI: Savannah Palmer is a 25 y.o. female with medical history significant of polysubstance abuse, IVDU.  Patient is brought in to ED by family due to AMS today.  There is initial question of possible withdrawal / withdrawal seizure.  Patient has incontinence at home.  Patient is very inconsistent in history giving to the ED providers during her stay in the ED and story keeps changing.  ED Course: Ultimately her mental status slowly improves during her course in the ED with supportive treatment only.  UDS comes back positive for opiates and cocaine.  Review of Systems: As per HPI otherwise 10 point review of systems negative.    Past Medical History:  Diagnosis Date  . Anxiety   . Anxiety   . Depression     Past Surgical History:  Procedure Laterality Date  . CHOLECYSTECTOMY       reports that she has been smoking.  She does not have any smokeless tobacco history on file. She reports that she drinks alcohol. She reports that she uses drugs, including Methamphetamines and Cocaine.  No Known Allergies  No family history on file.  No family history of addiction, parents are supportive of patient.   Prior to Admission medications   Medication Sig Start Date End Date Taking? Authorizing Provider  gabapentin (NEURONTIN) 300 MG capsule Take 300 mg by mouth 3 (three) times daily.    Historical Provider, MD  ibuprofen (ADVIL,MOTRIN) 400 MG tablet Take 1 tablet (400 mg total) by mouth every 6 (six) hours as needed. 02/14/14   Stark Bray, MD  ondansetron (ZOFRAN) 4 MG tablet Take 1 tablet (4 mg total) by mouth every 8 (eight) hours as needed for nausea or vomiting. 02/14/14   Stark Bray, MD  PARoxetine (PAXIL) 30 MG tablet Take 1 tablet (30 mg total) by mouth daily. 02/14/14   Stark Bray, MD    Physical Exam: Vitals:   01/13/16 0000 01/13/16 0015 01/13/16 0030 01/13/16 0045  BP: 118/70 117/68 115/68 112/70  Pulse: (!) 52 (!) 52 (!) 55 (!) 50  Resp:  21 (!) 30 (!) 32  Temp:      TempSrc:      SpO2: 99% 99% 99% 100%      Constitutional: NAD, calm, comfortable Eyes: PERRL, lids and conjunctivae normal ENMT: Mucous membranes are moist. Posterior pharynx clear of any exudate or lesions.Normal dentition.  Neck: normal, supple, no masses, no thyromegaly Respiratory: clear to auscultation bilaterally, no wheezing, no crackles. Normal respiratory effort. No accessory muscle use.  Cardiovascular: Murmur Abdomen: no tenderness, no masses palpated. No hepatosplenomegaly. Bowel sounds positive.  Musculoskeletal: no clubbing / cyanosis. No joint deformity upper and lower extremities. Good ROM, no contractures. Normal muscle tone.  Skin: Rash on right shoulder, ?Levamisol vasculitis though distribution isnt classic for this (face and ears spared, etc) Neurologic: CN 2-12 grossly intact. Sensation intact, DTR normal. Strength 5/5 in all 4.  Psychiatric: Normal judgment and insight. Alert and oriented x 3. Normal mood.    Labs on Admission: I have personally reviewed following labs and imaging studies  CBC:  Recent Labs Lab 01/12/16 1823  WBC 18.6*  NEUTROABS 15.3*  HGB 17.1*  HCT 48.4*  MCV 84.2  PLT 374   Basic Metabolic Panel:  Recent Labs Lab  01/12/16 1823  NA 140  K 3.3*  CL 97*  CO2 27  GLUCOSE 143*  BUN 36*  CREATININE 1.50*  CALCIUM 11.0*   GFR: CrCl cannot be calculated (Unknown ideal weight.). Liver Function Tests:  Recent Labs Lab 01/12/16 1823  AST 27  ALT 14  ALKPHOS 108  BILITOT 0.8  PROT 9.6*  ALBUMIN 5.5*   No results for input(s): LIPASE, AMYLASE in the last 168 hours. No results for input(s): AMMONIA in the last 168 hours. Coagulation Profile: No results for input(s): INR, PROTIME in the last 168 hours. Cardiac  Enzymes: No results for input(s): CKTOTAL, CKMB, CKMBINDEX, TROPONINI in the last 168 hours. BNP (last 3 results) No results for input(s): PROBNP in the last 8760 hours. HbA1C: No results for input(s): HGBA1C in the last 72 hours. CBG:  Recent Labs Lab 01/12/16 1808  GLUCAP 150*   Lipid Profile: No results for input(s): CHOL, HDL, LDLCALC, TRIG, CHOLHDL, LDLDIRECT in the last 72 hours. Thyroid Function Tests: No results for input(s): TSH, T4TOTAL, FREET4, T3FREE, THYROIDAB in the last 72 hours. Anemia Panel: No results for input(s): VITAMINB12, FOLATE, FERRITIN, TIBC, IRON, RETICCTPCT in the last 72 hours. Urine analysis:    Component Value Date/Time   COLORURINE AMBER (A) 01/12/2016 2247   APPEARANCEUR CLOUDY (A) 01/12/2016 2247   LABSPEC 1.025 01/12/2016 2247   PHURINE 6.0 01/12/2016 2247   GLUCOSEU NEGATIVE 01/12/2016 2247   HGBUR TRACE (A) 01/12/2016 2247   BILIRUBINUR SMALL (A) 01/12/2016 2247   KETONESUR 15 (A) 01/12/2016 2247   PROTEINUR 100 (A) 01/12/2016 2247   UROBILINOGEN 0.2 08/05/2010 1821   NITRITE NEGATIVE 01/12/2016 2247   LEUKOCYTESUR SMALL (A) 01/12/2016 2247   Sepsis Labs: @LABRCNTIP (procalcitonin:4,lacticidven:4) )No results found for this or any previous visit (from the past 240 hour(s)).   Radiological Exams on Admission: Dg Chest 2 View  Result Date: 01/12/2016 CLINICAL DATA:  Altered mental status. Patient has not taken her anxiety medicine in 1 week. EXAM: CHEST  2 VIEW COMPARISON:  08/07/2010 FINDINGS: Normal heart, mediastinum and hila. Clear lungs.  No pleural effusion or pneumothorax. Skeletal structures are unremarkable. IMPRESSION: Normal chest radiographs. Electronically Signed   By: Amie Portland M.D.   On: 01/12/2016 18:49  Ct Head Wo Contrast  Result Date: 01/12/2016 CLINICAL DATA:  Altered mental status. Seizure. Patient is unresponsive. EXAM: CT HEAD WITHOUT CONTRAST TECHNIQUE: Contiguous axial images were obtained from the base of  the skull through the vertex without intravenous contrast. COMPARISON:  CT scan dated 08/02/2010. FINDINGS: No mass lesion. No midline shift. No acute hemorrhage or hematoma. No extra-axial fluid collections. No evidence of acute infarction. Brain parenchyma is normal. Bones are normal. IMPRESSION: Normal exam. Electronically Signed   By: Francene Boyers M.D.   On: 01/12/2016 20:22   EKG: Independently reviewed.  Assessment/Plan Principal Problem:   Murmur, heart Active Problems:   AKI (acute kidney injury) (HCC)   IVDU (intravenous drug user)   Polysubstance (including opioids) dependence, daily use (HCC)   Polysubstance abuse -  AMS this evening seems more likely to be due to intoxication rather than withdrawal based on HPI above.  Will put on clonadine detox protocol for opiates  Will put on CIWA detox protocol for benzos  Seizure precautions just in case her AMS was due to seizure from withdrawal.  Not really showing overt withdrawal symptoms from benzos or opiates at this time though.  AKI -  IVF  Repeat BMP in AM  ? Cocaine toxicity  New heart murmur present not previously known or documented -  BCx ordered  2d echo ordered  CT head negative for obvious signs of emboli  Will hold off on ABx for now pending BCx / 2d echo results unless she develops fever or more overt signs of infection.   DVT prophylaxis: Heparin [place] Code Status: Full Family Communication: No family in room Consults called: None Admission status: Admit to obs   Yuji Walth, Heywood Iles DO [facility] Pager [phone] from 7PM-7AM  If 7AM-7PM, please contact the day physician for the patient [email or web address] Password [facility]  01/13/2016, 12:58 AM

## 2016-01-13 NOTE — ED Notes (Signed)
Hospitalist at bedside at this time 

## 2016-01-13 NOTE — ED Provider Notes (Signed)
Pt reevaluated by me.  Pt admits recent drug use.  Pt feels like she has some withdrawal symptoms.  Pt reports she feels bad.  Pt given Iv fluids. Labs show elevated WBC's.  Records reviewed.  No heart murmur documented in the past.  Pt denies ever being told she has a murmur.  I will speak with medicine to admit for possible endocarditis and altered mental status.    Lonia Skinner [place], PA-C 01/13/16 [id number]    Marily Memos, MD 01/13/16 [phone]

## 2016-01-13 NOTE — ED Notes (Signed)
Attempted report to [facility], was told they have no more telemetry boxes and can't take the patient.

## 2016-01-14 DIAGNOSIS — F1920 Other psychoactive substance dependence, uncomplicated: Secondary | ICD-10-CM | POA: Diagnosis not present

## 2016-01-14 DIAGNOSIS — R011 Cardiac murmur, unspecified: Secondary | ICD-10-CM | POA: Diagnosis not present

## 2016-01-14 DIAGNOSIS — F1990 Other psychoactive substance use, unspecified, uncomplicated: Secondary | ICD-10-CM | POA: Diagnosis not present

## 2016-01-14 DIAGNOSIS — R894 Abnormal immunological findings in specimens from other organs, systems and tissues: Secondary | ICD-10-CM | POA: Diagnosis not present

## 2016-01-14 DIAGNOSIS — N179 Acute kidney failure, unspecified: Secondary | ICD-10-CM | POA: Diagnosis not present

## 2016-01-14 LAB — BASIC METABOLIC PANEL
Anion gap: 11 (ref 5–15)
BUN: 22 mg/dL — AB (ref 6–20)
CALCIUM: 9.5 mg/dL (ref 8.9–10.3)
CO2: 24 mmol/L (ref 22–32)
CREATININE: 0.74 mg/dL (ref 0.44–1.00)
Chloride: 101 mmol/L (ref 101–111)
GFR calc Af Amer: >60 mL/min (ref >60)
GFR calc non Af Amer: >60 mL/min (ref >60)
GLUCOSE: 96 mg/dL (ref 65–99)
Potassium: 2.9 mmol/L — ABNORMAL LOW (ref 3.5–5.1)
Sodium: 136 mmol/L (ref 135–145)

## 2016-01-14 LAB — HIV ANTIBODY (ROUTINE TESTING W REFLEX): HIV SCREEN 4TH GENERATION: NONREACTIVE

## 2016-01-14 LAB — CBC
HCT: 40.1 % (ref 36.0–46.0)
Hemoglobin: 13.4 g/dL (ref 12.0–15.0)
MCH: 28.8 pg (ref 26.0–34.0)
MCHC: 33.4 g/dL (ref 30.0–36.0)
MCV: 86.2 fL (ref 78.0–100.0)
PLATELETS: 334 10*3/uL (ref 150–400)
RBC: 4.65 MIL/uL (ref 3.87–5.11)
RDW: 12.9 % (ref 11.5–15.5)
WBC: 13.1 10*3/uL — ABNORMAL HIGH (ref 4.0–10.5)

## 2016-01-14 LAB — RPR: RPR Ser Ql: NONREACTIVE

## 2016-01-14 LAB — MAGNESIUM: Magnesium: 2.5 mg/dL — ABNORMAL HIGH (ref 1.7–2.4)

## 2016-01-14 MED ORDER — CLONIDINE HCL 0.1 MG PO TABS: 0.1000 mg | ORAL_TABLET | Freq: Three times a day (TID) | ORAL | Status: DC | PRN

## 2016-01-14 MED ORDER — LORAZEPAM 2 MG/ML IJ SOLN
1.0000 mg | Freq: Once | INTRAMUSCULAR | Status: AC
  Administered 2016-01-14: 1 mg via INTRAVENOUS
  Filled 2016-01-14: qty 1

## 2016-01-14 MED ORDER — CLONAZEPAM 0.5 MG PO TABS
0.2500 mg | ORAL_TABLET | Freq: Two times a day (BID) | ORAL | Status: DC
  Administered 2016-01-14 – 2016-01-19 (×11): 0.25 mg via ORAL
  Filled 2016-01-14 (×11): qty 1

## 2016-01-14 MED ORDER — BUPRENORPHINE HCL 8 MG SL SUBL
8.0000 mg | SUBLINGUAL_TABLET | Freq: Two times a day (BID) | SUBLINGUAL | Status: DC
  Administered 2016-01-14 – 2016-01-19 (×11): 8 mg via SUBLINGUAL
  Filled 2016-01-14 (×11): qty 1

## 2016-01-14 MED ORDER — POTASSIUM CHLORIDE CRYS ER 20 MEQ PO TBCR
40.0000 meq | EXTENDED_RELEASE_TABLET | ORAL | Status: AC
  Administered 2016-01-14 (×2): 40 meq via ORAL
  Filled 2016-01-14 (×2): qty 2

## 2016-01-14 NOTE — Progress Notes (Signed)
PROGRESS NOTE  Savannah Palmer  LDJ:[id number] DOB: 1991-01-05 DOA: 01/12/2016 PCP: Default, Provider, MD  Brief Narrative:   Savannah Palmer is a 25 y.o. female with medical history significant of polysubstance abuse, IVDU.  Patient is brought in to ED by family due to AMS today.  There is initial question of possible withdrawal / withdrawal seizure.  Patient has incontinence at home.  Patient is very inconsistent in history giving to the ED providers during her stay in the ED and story keeps changing.  Assessment & Plan:   Principal Problem:   Murmur, heart Active Problems:   AKI (acute kidney injury) (HCC)   IVDU (intravenous drug user)   Polysubstance (including opioids) dependence, daily use (HCC)   Altered mental status  Toxic encephalopathy secondary to polysubstance abuse, improved.  -  D/c clonidine detox protocol this morning due to oversedation, resume once patient more alert -  Continue CIWA for benzo withdrawal -  Seizure precautions  Benzo withdrawal -  Resume clonazepam at half of home dose  Narcotic withdrawal -  Resume suboxone  -  Case management to assist with suboxone program affordability (insurance care no longer working and having to pay $80/week for med)  AKI, likely due to cocaine use and resolved with IVF -  D/c IVF  Heart murmur, new, in setting of IVDA -  BCx NGTD -  ECHO pending -  CT head negative for septic emboli  Polysubstance abuse, IVDA -  RPR, hepatitis B and C, and HIV pending -  GC/Ch per patient request -  SW consult regarding local drug rehab programs  Hypokalemia, oral potassium repletion.  Magnesium wnl   DVT prophylaxis:  Heparin  Code Status:  full Family Communication:  Patient alone Disposition Plan:  Pending improvement in mentation   Consultants:   none  Procedures:  none  Antimicrobials:   none    Subjective:  Had been abstinent for a while taking suboxone and clonazepam but recently her insurance  card stopped paying for her suboxone and her father was unable to afford the $80/week out of pocket.  She became anxious and started using IV heroine and cocaine.  She was compliant with her clonazepam reportedly even though her urine drug screen was negative.  Cocaine use was a couple weeks prior to admission and IV heroine occurred right before she went to her father's house and was brought to the ER.    Objective: Vitals:   01/13/16 1231 01/13/16 2354 01/14/16 0506 01/14/16 1148  BP: 109/62 123/75 114/63 128/69  Pulse: (!) 53 (!) 56 (!) 58 63  Resp: 19 20 18 20   Temp: 99.1 F (37.3 C) 98.5 F (36.9 C) 99.2 F (37.3 C) 99.1 F (37.3 C)  TempSrc: Oral Oral Oral Oral  SpO2: 100% 98%  97%  Weight:      Height:        Intake/Output Summary (Last 24 hours) at 01/14/16 1447 Last data filed at 01/14/16 1211  Gross per 24 hour  Intake              240 ml  Output                0 ml  Net              240 ml   Filed Weights   01/13/16 0246  Weight: 75.5 kg (166 lb 6.4 oz)    Examination:  General exam:  Adult female.  No acute distress.  HEENT:  NCAT, MMM, tongue fasciculations present Respiratory system: Clear to auscultation bilaterally Cardiovascular system: Regular rate and rhythm, normal S1/S2. 1/6 murmur.  Warm extremities Gastrointestinal system: Normal active bowel sounds, soft, nondistended, nontender. MSK:  Normal tone and bulk, no lower extremity edema Neuro:  Grossly intact, tremulous    Data Reviewed: I have personally reviewed following labs and imaging studies  CBC:  Recent Labs Lab 01/12/16 1823 01/13/16 0152 01/14/16 0453  WBC 18.6* 15.5* 13.1*  NEUTROABS 15.3*  --   --   HGB 17.1* 15.0 13.4  HCT 48.4* 44.1 40.1  MCV 84.2 87.3 86.2  PLT 374 360 334   Basic Metabolic Panel:  Recent Labs Lab 01/12/16 1823 01/13/16 0152 01/14/16 0453  NA 140 141 136  K 3.3* 3.5 2.9*  CL 97* 101 101  CO2 GLUCOSE 143* 137* 96  BUN 36* 33* 22*    CREATININE 1.50* 1.16* 0.74  CALCIUM 11.0* 10.1 9.5  MG  --   --  2.5*   GFR: Estimated Creatinine Clearance: 104.5 mL/min (by C-G formula based on SCr of 0.8 mg/dL). Liver Function Tests:  Recent Labs Lab 01/12/16 1823  AST 27  ALT 14  ALKPHOS 108  BILITOT 0.8  PROT 9.6*  ALBUMIN 5.5*   No results for input(s): LIPASE, AMYLASE in the last 168 hours. No results for input(s): AMMONIA in the last 168 hours. Coagulation Profile: No results for input(s): INR, PROTIME in the last 168 hours. Cardiac Enzymes: No results for input(s): CKTOTAL, CKMB, CKMBINDEX, TROPONINI in the last 168 hours. BNP (last 3 results) No results for input(s): PROBNP in the last 8760 hours. HbA1C: No results for input(s): HGBA1C in the last 72 hours. CBG:  Recent Labs Lab 01/12/16 1808  GLUCAP 150*   Lipid Profile: No results for input(s): CHOL, HDL, LDLCALC, TRIG, CHOLHDL, LDLDIRECT in the last 72 hours. Thyroid Function Tests: No results for input(s): TSH, T4TOTAL, FREET4, T3FREE, THYROIDAB in the last 72 hours. Anemia Panel: No results for input(s): VITAMINB12, FOLATE, FERRITIN, TIBC, IRON, RETICCTPCT in the last 72 hours. Urine analysis:    Component Value Date/Time   COLORURINE AMBER (A) 01/12/2016 2247   APPEARANCEUR CLOUDY (A) 01/12/2016 2247   LABSPEC 1.025 01/12/2016 2247   PHURINE 6.0 01/12/2016 2247   GLUCOSEU NEGATIVE 01/12/2016 2247   HGBUR TRACE (A) 01/12/2016 2247   BILIRUBINUR SMALL (A) 01/12/2016 2247   KETONESUR 15 (A) 01/12/2016 2247   PROTEINUR 100 (A) 01/12/2016 2247   UROBILINOGEN 0.2 08/05/2010 1821   NITRITE NEGATIVE 01/12/2016 2247   LEUKOCYTESUR SMALL (A) 01/12/2016 2247   Sepsis Labs: (procalcitonin:4,lacticidven:4)  ) Recent Results (from the past 240 hour(s))  Blood culture (routine x 2)     Status: None (Preliminary result)   Collection Time: 01/12/16  6:20 PM  Result Value Ref Range Status   Specimen Description BLOOD RIGHT ANTECUBITAL   Final   Special Requests BOTTLES DRAWN AEROBIC ONLY 5CC  Final   Culture NO GROWTH 2 DAYS  Final   Report Status PENDING  Incomplete  Blood culture (routine x 2)     Status: None (Preliminary result)   Collection Time: 01/12/16  6:23 PM  Result Value Ref Range Status   Specimen Description BLOOD LEFT ARM  Final   Special Requests BOTTLES DRAWN AEROBIC ONLY 5CC  Final   Culture NO GROWTH 2 DAYS  Final   Report Status PENDING  Incomplete      Radiology Studies: Dg Chest 2 View  Result Date: 01/12/2016 CLINICAL DATA:  Altered mental status. Patient has not taken her anxiety medicine in 1 week. EXAM: CHEST  2 VIEW COMPARISON:  08/07/2010 FINDINGS: Normal heart, mediastinum and hila. Clear lungs.  No pleural effusion or pneumothorax. Skeletal structures are unremarkable. IMPRESSION: Normal chest radiographs. Electronically Signed   By: Amie Portland M.D.   On: 01/12/2016 18:49  Ct Head Wo Contrast  Result Date: 01/12/2016 CLINICAL DATA:  Altered mental status. Seizure. Patient is unresponsive. EXAM: CT HEAD WITHOUT CONTRAST TECHNIQUE: Contiguous axial images were obtained from the base of the skull through the vertex without intravenous contrast. COMPARISON:  CT scan dated 08/02/2010. FINDINGS: No mass lesion. No midline shift. No acute hemorrhage or hematoma. No extra-axial fluid collections. No evidence of acute infarction. Brain parenchyma is normal. Bones are normal. IMPRESSION: Normal exam. Electronically Signed   By: Francene Boyers M.D.   On: 01/12/2016 20:22    Scheduled Meds: . buprenorphine  8 mg Sublingual BID  . clonazePAM  0.25 mg Oral BID  . folic acid  1 mg Oral Daily  . heparin  5,000 Units Subcutaneous Q8H  . multivitamin with minerals  1 tablet Oral Daily  . thiamine  100 mg Oral Daily   Continuous Infusions:    LOS: 0 days    Time spent: 30 min    Renae Fickle, MD [facility] Pager [phone]  If 7PM-7AM, please contact  night-coverage [email or web address] Password [facility] 01/14/2016, 2:47 PM

## 2016-01-15 ENCOUNTER — Observation Stay (HOSPITAL_COMMUNITY): Payer: BLUE CROSS/BLUE SHIELD

## 2016-01-15 ENCOUNTER — Observation Stay (HOSPITAL_BASED_OUTPATIENT_CLINIC_OR_DEPARTMENT_OTHER): Payer: BLUE CROSS/BLUE SHIELD

## 2016-01-15 DIAGNOSIS — R011 Cardiac murmur, unspecified: Secondary | ICD-10-CM | POA: Diagnosis not present

## 2016-01-15 DIAGNOSIS — F1920 Other psychoactive substance dependence, uncomplicated: Secondary | ICD-10-CM | POA: Diagnosis not present

## 2016-01-15 LAB — BASIC METABOLIC PANEL
ANION GAP: 7 (ref 5–15)
BUN: 13 mg/dL (ref 6–20)
CALCIUM: 9.5 mg/dL (ref 8.9–10.3)
CO2: 24 mmol/L (ref 22–32)
Chloride: 106 mmol/L (ref 101–111)
Creatinine, Ser: 0.71 mg/dL (ref 0.44–1.00)
Glucose, Bld: 88 mg/dL (ref 65–99)
Potassium: 3.9 mmol/L (ref 3.5–5.1)
Sodium: 137 mmol/L (ref 135–145)

## 2016-01-15 LAB — HEPATITIS B SURFACE ANTIGEN: HEP B S AG: NEGATIVE

## 2016-01-15 LAB — CBC
HCT: 39.1 % (ref 36.0–46.0)
HEMOGLOBIN: 13.1 g/dL (ref 12.0–15.0)
MCH: 29.2 pg (ref 26.0–34.0)
MCHC: 33.5 g/dL (ref 30.0–36.0)
MCV: 87.3 fL (ref 78.0–100.0)
Platelets: 257 10*3/uL (ref 150–400)
RBC: 4.48 MIL/uL (ref 3.87–5.11)
RDW: 12.7 % (ref 11.5–15.5)
WBC: 8.2 10*3/uL (ref 4.0–10.5)

## 2016-01-15 LAB — ECHOCARDIOGRAM COMPLETE
HEIGHTINCHES: 63 in
WEIGHTICAEL: 2662.4 [oz_av]

## 2016-01-15 LAB — HEPATITIS B SURFACE ANTIBODY,QUALITATIVE: HEP B S AB: NONREACTIVE

## 2016-01-15 LAB — HEPATITIS C ANTIBODY: HCV Ab: >11 s/co ratio — ABNORMAL HIGH (ref 0.0–0.9)

## 2016-01-15 NOTE — Progress Notes (Signed)
  Echocardiogram 2D Echocardiogram has been performed.  Savannah Palmer 01/15/2016, 11:43 AM

## 2016-01-15 NOTE — Progress Notes (Addendum)
PROGRESS NOTE  Savannah Palmer  GNF:[id number] DOB: Aug 17, 1990 DOA: 01/12/2016 PCP: Default, Provider, MD  Brief Narrative:   Savannah Palmer is a 25 y.o. female with medical history significant of polysubstance abuse, IVDU.  Patient is brought in to ED by family due to AMS.  Patient reports that her insurance card recently stopped working on her suboxone and the cost went up to $80 per week.  Her father refused to pay for her suboxone because it was too expensive.  She started using IV heroin again and cocaine.  Last used cocaine a few weeks prior to admission and last used heroin just before going to her father's house.  She had been routinely taking her clonazepam and only recently stopped taking her suboxone. Was spending $60-$70 on heroin daily.    Assessment & Plan:   Principal Problem:   Murmur, heart Active Problems:   AKI (acute kidney injury) (HCC)   IVDU (intravenous drug user)   Polysubstance (including opioids) dependence, daily use (HCC)   Altered mental status  Toxic encephalopathy secondary to polysubstance abuse, resolved.  Benzo withdrawal -  Resumed clonazepam at half of home dose -  Continue CIWA   Narcotic withdrawal -  Resumed suboxone  -  Case management to assist with suboxone program affordability (insurance care no longer working and having to pay $80/week for med)  AKI, likely due to cocaine use and resolved with IVF  Heart murmur, new, in setting of IVDA -  BCx NGTD -  ECHO pending -  CT head negative for septic emboli  Polysubstance abuse, IVDA -  RPR negative -  Hepatitis B negative -  Hep C Ab positive -  HIV pending NR -  GC/Ch needs to be resent to lab -  SW consult regarding local drug rehab programs  Hepatitis C ab positive -  HCV quant  -  HCV genotype  Hypokalemia, oral potassium repletion.  Magnesium wnl   DVT prophylaxis:  Heparin  Code Status:  full Family Communication:  Patient alone Disposition Plan:  Awaiting  inpatient psychiatric placement  Consultants:   none  Procedures:  none  Antimicrobials:   none    Subjective:  Feeling better and would like to be discharged soon.  Mother would like a formal psychiatric evaluation and hopes to qualify her daughter for inpatient behavioral health.  Feels less shaky than yesterday  Objective: Vitals:   01/14/16 1739 01/14/16 2217 01/15/16 0004 01/15/16 0602  BP: 114/70 103/67 98/63 114/72  Pulse: (!) 58 (!) 55 (!) 57 (!) 53  Resp: Temp: 98.2 F (36.8 C) 98.4 F (36.9 C) 98.4 F (36.9 C) 98.2 F (36.8 C)  TempSrc: Oral Oral Oral Oral  SpO2: 98% 98% 96% 100%  Weight:      Height:        Intake/Output Summary (Last 24 hours) at 01/15/16 1345 Last data filed at 01/15/16 0944  Gross per 24 hour  Intake              600 ml  Output                0 ml  Net              600 ml   Filed Weights   01/13/16 0246  Weight: 75.5 kg (166 lb 6.4 oz)    Examination:  General exam:  Adult female.  No acute distress.  HEENT:  NCAT, MMM, mild tongue fasciculations  Respiratory system: Clear to auscultation bilaterally Cardiovascular system: Regular rate and rhythm, normal S1/S2. 1/6 murmur.  Warm extremities Gastrointestinal system: Normal active bowel sounds, soft, nondistended, nontender. MSK:  Normal tone and bulk, no lower extremity edema Neuro:  Grossly intact, mild tremor, improved from yesterday    Data Reviewed: I have personally reviewed following labs and imaging studies  CBC:  Recent Labs Lab 01/12/16 1823 01/13/16 0152 01/14/16 0453 01/15/16 0632  WBC 18.6* 15.5* 13.1* 8.2  NEUTROABS 15.3*  --   --   --   HGB 17.1* 15.0 13.4 13.1  HCT 48.4* 44.1 40.1 39.1  MCV 84.2 87.3 86.2 87.3  PLT 374 360 334 257   Basic Metabolic Panel:  Recent Labs Lab 01/12/16 1823 01/13/16 0152 01/14/16 0453 01/15/16 0632  NA 140 141 136 137  K 3.3* 3.5 2.9* 3.9  CL 97* 101 101 106  CO2 27 27 24 24   GLUCOSE 143* 137*  96 88  BUN 36* 33* 22* 13  CREATININE 1.50* 1.16* 0.74 0.71  CALCIUM 11.0* 10.1 9.5 9.5  MG  --   --  2.5*  --    GFR: Estimated Creatinine Clearance: 104.5 mL/min (by C-G formula based on SCr of 0.8 mg/dL). Liver Function Tests:  Recent Labs Lab 01/12/16 1823  AST 27  ALT 14  ALKPHOS 108  BILITOT 0.8  PROT 9.6*  ALBUMIN 5.5*   No results for input(s): LIPASE, AMYLASE in the last 168 hours. No results for input(s): AMMONIA in the last 168 hours. Coagulation Profile: No results for input(s): INR, PROTIME in the last 168 hours. Cardiac Enzymes: No results for input(s): CKTOTAL, CKMB, CKMBINDEX, TROPONINI in the last 168 hours. BNP (last 3 results) No results for input(s): PROBNP in the last 8760 hours. HbA1C: No results for input(s): HGBA1C in the last 72 hours. CBG:  Recent Labs Lab 01/12/16 1808  GLUCAP 150*   Lipid Profile: No results for input(s): CHOL, HDL, LDLCALC, TRIG, CHOLHDL, LDLDIRECT in the last 72 hours. Thyroid Function Tests: No results for input(s): TSH, T4TOTAL, FREET4, T3FREE, THYROIDAB in the last 72 hours. Anemia Panel: No results for input(s): VITAMINB12, FOLATE, FERRITIN, TIBC, IRON, RETICCTPCT in the last 72 hours. Urine analysis:    Component Value Date/Time   COLORURINE AMBER (A) 01/12/2016 2247   APPEARANCEUR CLOUDY (A) 01/12/2016 2247   LABSPEC 1.025 01/12/2016 2247   PHURINE 6.0 01/12/2016 2247   GLUCOSEU NEGATIVE 01/12/2016 2247   HGBUR TRACE (A) 01/12/2016 2247   BILIRUBINUR SMALL (A) 01/12/2016 2247   KETONESUR 15 (A) 01/12/2016 2247   PROTEINUR 100 (A) 01/12/2016 2247   UROBILINOGEN 0.2 08/05/2010 1821   NITRITE NEGATIVE 01/12/2016 2247   LEUKOCYTESUR SMALL (A) 01/12/2016 2247   Sepsis Labs: @LABRCNTIP (procalcitonin:4,lacticidven:4)  ) Recent Results (from the past 240 hour(s))  Blood culture (routine x 2)     Status: None (Preliminary result)   Collection Time: 01/12/16  6:20 PM  Result Value Ref Range Status   Specimen  Description BLOOD RIGHT ANTECUBITAL  Final   Special Requests BOTTLES DRAWN AEROBIC ONLY 5CC  Final   Culture NO GROWTH 3 DAYS  Final   Report Status PENDING  Incomplete  Blood culture (routine x 2)     Status: None (Preliminary result)   Collection Time: 01/12/16  6:23 PM  Result Value Ref Range Status   Specimen Description BLOOD LEFT ARM  Final   Special Requests BOTTLES DRAWN AEROBIC ONLY 5CC  Final   Culture NO GROWTH 3 DAYS  Final   Report Status PENDING  Incomplete      Radiology Studies: No results found.   Scheduled Meds: . buprenorphine  8 mg Sublingual BID  . clonazePAM  0.25 mg Oral BID  . folic acid  1 mg Oral Daily  . heparin  5,000 Units Subcutaneous Q8H  . multivitamin with minerals  1 tablet Oral Daily  . thiamine  100 mg Oral Daily   Continuous Infusions:    LOS: 0 days    Time spent: 30 min    Renae Fickle, MD [facility] Pager [phone]  If 7PM-7AM, please contact night-coverage [email or web address] Password [email or web address] 01/15/2016, 1:45 PM

## 2016-01-15 NOTE — Consult Note (Addendum)
[facility] Face-to-Face Psychiatry Consult   Reason for Consult:  Substance abuse  Referring Physician:  Dr. Sheran Fava  Patient Identification: Savannah Palmer MRN:  [id number] Principal Diagnosis: Murmur, heart Diagnosis:   Patient Active Problem List   Diagnosis Date Noted  . AKI (acute kidney injury) ([place]) [N17.9] 01/13/2016  . IVDU (intravenous drug user) [F19.90] 01/13/2016  . Polysubstance (including opioids) dependence, daily use ([place]) [F19.20] 01/13/2016  . Murmur, heart [R01.1] 01/13/2016  . Altered mental status [R41.82]     Total Time spent with patient: 30 minutes  Subjective:   Savannah Palmer is a 25 y.o. female patient admitted with mental status changes .  HPI:  Patient is a 25 year old single female, currently living  with father . She has a history of substance dependence, and identifies opiates as substance of choice , but also uses cocaine , benzodiazepines. Denies alcohol abuse. She had been on Suboxone, up to 16 mgs/SL , but states she had also been using IV heroin . She recently stopped Suboxone due to the cost of it,  and her heroin use escalated . Reports she was also prescribed Klonopin , which she states she took BID as prescribed most of the time, although did run out of this medication  early. ED chart note indicates recent Xanax use, patient does not endorse.  Patient thinks her  mental status changes were related to an unwitnessed seizure, in turn related to BZD/Opiate WDL . Her last drug use was 5 days ago. At this time she is not presenting with significant tremors, diaphoresis, and her vitals are stable . She does report some slight  symptoms of WDL, particularly aches, malaise. Does not appear to be in any acute distress . Of note, patient is currently on Suboxone 8 mgrs BID and on Klonopin 0.5 mgs BID  She presents depressed, sad, often tearful. Denies active suicidal ideations at this time. Patient ( and father , who provided collateral information )  reports that she did the best when she went to a residential rehab setting  In the past .  Past Psychiatric History: history of polysubstance dependence, history of depression, history of panic disorder Patient states she has been on Zoloft, Celexa, Lexapro in the past, states Lexapro was helpful and well tolerated.  Risk to Self: Is patient at risk for suicide?: No Risk to Others:  no  Prior Inpatient Therapy:  yes Prior Outpatient Therapy:  yes   Past Medical History:  Past Medical History:  Diagnosis Date  . Anxiety   . Anxiety   . Depression     Past Surgical History:  Procedure Laterality Date  . CHOLECYSTECTOMY     Family History: No family history on file. Family Psychiatric  History:  Social History:  History  Alcohol Use  . Yes    Comment: occasinally socially     History  Drug Use  . Types: Methamphetamines, Cocaine    Comment: Also Herion    Social History   Social History  . Marital status: Single    Spouse name: N/A  . Number of children: N/A  . Years of education: N/A   Social History Main Topics  . Smoking status: Current Some Day Smoker  . Smokeless tobacco: Not on file  . Alcohol use Yes     Comment: occasinally socially  . Drug use:     Types: Methamphetamines, Cocaine     Comment: Also Herion  . Sexual activity: Yes    Birth control/  protection: Injection   Other Topics Concern  . Not on file   Social History Narrative  . No narrative on file   Additional Social History:    Allergies:  No Known Allergies  Labs:  Results for orders placed or performed during the hospital encounter of 01/12/16 (from the past 48 hour(s))  HIV antibody     Status: None   Collection Time: 01/14/16  4:53 AM  Result Value Ref Range   HIV Screen 4th Generation wRfx Non Reactive Non Reactive    Comment: (NOTE) Performed At: [facility] [place], [place] [id number] Lindon Romp MD [id number]   RPR     Status: None    Collection Time: 01/14/16  4:53 AM  Result Value Ref Range   RPR Ser Ql Non Reactive Non Reactive    Comment: (NOTE) Performed At: [facility] [place], [place] [id number] Lindon Romp MD [id number]   Hepatitis B surface antibody     Status: None   Collection Time: 01/14/16  4:53 AM  Result Value Ref Range   Hep B S Ab Non Reactive     Comment: (NOTE)              Non Reactive: Inconsistent with immunity,                            less than 10 mIU/mL              Reactive:     Consistent with immunity,                            greater than 9.9 mIU/mL Performed At: [facility] [place], [place] [id number] Lindon Romp MD [id number]   Hepatitis B surface antigen     Status: None   Collection Time: 01/14/16  4:53 AM  Result Value Ref Range   Hepatitis B Surface Ag Negative Negative    Comment: (NOTE) Performed At: [facility] [place], [place] [id number] Lindon Romp MD [id number]   Hepatitis C antibody     Status: Abnormal   Collection Time: 01/14/16  4:53 AM  Result Value Ref Range   HCV Ab >11.0 (H) 0.0 - 0.9 s/co ratio    Comment: (NOTE)                                  Negative:     < 0.8                             Indeterminate: 0.8 - 0.9                                  Positive:     > 0.9 The CDC recommends that a positive HCV antibody result be followed up with a HCV Nucleic Acid Amplification test ([id number]). Performed At: [facility] [place], [place] [id number] Lindon Romp MD [id number]   Basic metabolic panel     Status: Abnormal   Collection Time: 01/14/16  4:53 AM  Result Value Ref Range   Sodium 136 135 - 145 mmol/L   Potassium 2.9 (L) 3.5 -  5.1 mmol/L    Comment: DELTA CHECK NOTED   Chloride 101 101 - 111 mmol/L   CO2 24 22 - 32 mmol/L   Glucose, Bld 96 65 - 99 mg/dL   BUN 22 (H) 6 - 20 mg/dL   Creatinine, Ser 0.74 0.44 - 1.00  mg/dL   Calcium 9.5 8.9 - 10.3 mg/dL   GFR calc non Af Amer >60 >60 mL/min   GFR calc Af Amer >60 >60 mL/min    Comment: (NOTE) The eGFR has been calculated using the CKD EPI equation. This calculation has not been validated in all clinical situations. eGFR's persistently <60 mL/min signify possible Chronic Kidney Disease.    Anion gap 11 5 - 15  CBC     Status: Abnormal   Collection Time: 01/14/16  4:53 AM  Result Value Ref Range   WBC 13.1 (H) 4.0 - 10.5 K/uL   RBC 4.65 3.87 - 5.11 MIL/uL   Hemoglobin 13.4 12.0 - 15.0 g/dL   HCT 40.1 36.0 - 46.0 %   MCV 86.2 78.0 - 100.0 fL   MCH 28.8 26.0 - 34.0 pg   MCHC 33.4 30.0 - 36.0 g/dL   RDW 12.9 11.5 - 15.5 %   Platelets 334 150 - 400 K/uL  Magnesium     Status: Abnormal   Collection Time: 01/14/16  4:53 AM  Result Value Ref Range   Magnesium 2.5 (H) 1.7 - 2.4 mg/dL  Basic metabolic panel     Status: None   Collection Time: 01/15/16  6:32 AM  Result Value Ref Range   Sodium 137 135 - 145 mmol/L   Potassium 3.9 3.5 - 5.1 mmol/L   Chloride 106 101 - 111 mmol/L   CO2 24 22 - 32 mmol/L   Glucose, Bld 88 65 - 99 mg/dL   BUN 13 6 - 20 mg/dL   Creatinine, Ser 0.71 0.44 - 1.00 mg/dL   Calcium 9.5 8.9 - 10.3 mg/dL   GFR calc non Af Amer >60 >60 mL/min   GFR calc Af Amer >60 >60 mL/min    Comment: (NOTE) The eGFR has been calculated using the CKD EPI equation. This calculation has not been validated in all clinical situations. eGFR's persistently <60 mL/min signify possible Chronic Kidney Disease.    Anion gap 7 5 - 15  CBC     Status: None   Collection Time: 01/15/16  6:32 AM  Result Value Ref Range   WBC 8.2 4.0 - 10.5 K/uL   RBC 4.48 3.87 - 5.11 MIL/uL   Hemoglobin 13.1 12.0 - 15.0 g/dL   HCT 39.1 36.0 - 46.0 %   MCV 87.3 78.0 - 100.0 fL   MCH 29.2 26.0 - 34.0 pg   MCHC 33.5 30.0 - 36.0 g/dL   RDW 12.7 11.5 - 15.5 %   Platelets 257 150 - 400 K/uL    Current Facility-Administered Medications  Medication Dose Route  Frequency Provider Last Rate Last Dose  . buprenorphine (SUBUTEX) sublingual tablet 8 mg  8 mg Sublingual BID Janece Canterbury, MD   8 mg at 01/15/16 1022  . clonazePAM (KLONOPIN) tablet 0.25 mg  0.25 mg Oral BID Janece Canterbury, MD   0.25 mg at 01/15/16 1022  . cloNIDine (CATAPRES) tablet 0.1 mg  0.1 mg Oral Q8H PRN Janece Canterbury, MD      . dicyclomine (BENTYL) tablet 20 mg  20 mg Oral Q6H PRN Etta Quill, DO      . folic acid (FOLVITE) tablet  1 mg  1 mg Oral Daily Etta Quill, DO   1 mg at 01/15/16 1022  . heparin injection 5,000 Units  5,000 Units Subcutaneous Q8H Etta Quill, DO   5,000 Units at 01/15/16 1359  . hydrOXYzine (ATARAX/VISTARIL) tablet 25 mg  25 mg Oral Q6H PRN Etta Quill, DO   25 mg at 01/13/16 2048  . loperamide (IMODIUM) capsule 2-4 mg  2-4 mg Oral PRN Etta Quill, DO      . LORazepam (ATIVAN) tablet 1 mg  1 mg Oral Q6H PRN Etta Quill, DO   1 mg at 01/15/16 1402   Or  . LORazepam (ATIVAN) injection 1 mg  1 mg Intravenous Q6H PRN Etta Quill, DO   1 mg at 01/14/16 3818  . methocarbamol (ROBAXIN) tablet 500 mg  500 mg Oral Q8H PRN Etta Quill, DO      . multivitamin with minerals tablet 1 tablet  1 tablet Oral Daily Etta Quill, DO   1 tablet at 01/15/16 1022  . naproxen (NAPROSYN) tablet 500 mg  500 mg Oral BID PRN Etta Quill, DO      . ondansetron (ZOFRAN-ODT) disintegrating tablet 4 mg  4 mg Oral Q6H PRN Etta Quill, DO   4 mg at 01/13/16 1713  . thiamine (VITAMIN B-1) tablet 100 mg  100 mg Oral Daily Etta Quill, DO   100 mg at 01/15/16 1022    Musculoskeletal: Strength & Muscle Tone: within normal limits Gait & Station: in bed, gait not examined  Patient leans: N/A  Psychiatric Specialty Exam: Physical Exam  ROS   Blood pressure 114/72, pulse (!) 53, temperature 98.2 F (36.8 C), temperature source Oral, resp. rate 18, height _0  (1.6 m), weight 166 lb 6.4 oz (75.5 kg), SpO2 100 %.Body mass index is 29.48 kg/m.   General Appearance: fairly groomed   Eye Contact:  Good  Speech:  Slow  Volume:  Decreased  Mood:  Depressed  Affect:  Constricted and Tearful  Thought Process:  Linear  Orientation:  Other:  fully alert and attentive   Thought Content:  Denies hallucinations, no delusions expressed   Suicidal Thoughts:  denies plan or intention of suicide or self injurious ideations   Homicidal Thoughts:  No  Memory:  recent and remote grossly intact   Judgement:  Fair  Insight:  improving   Psychomotor Activity:  Decreased  Concentration:  Concentration: Good and Attention Span: NA  Recall:  Good  Fund of Knowledge:  Good  Language:  Good  Akathisia:  NA  Handed:  Right  AIMS (if indicated):     Assets:  Resilience Social Support  ADL's:  Fair   Cognition:  Fully alert , attentive at this time  Sleep:      Assessment - 25 year old female, history of polysubstance dependence, opiates identified as substance of choice . Recently stopped had stopped taking Suboxone, has been using IV heroin, and has been using BZDs . Of note, patient states she was using IV heroin at times even while on Suboxone  Patient had acute mental status changes which she thinks were related to withdrawal seizures. At this time last used substances 4-5 days ago. Of note, currently on Suboxone and low dose Klonopin She presents depressed, sad, tearful, with some passive thoughts of death, but no active SI. She wants to go to a residential  rehab to work on sobriety / relapse prevention Currently on Suboxone-  although fearful, she is wanting  to taper off .  Treatment Plan Summary: Plan no current grounds for involuntary commitment   -Patient would benefit from inpatient psychiatric admission for depression, anxiety management,  Opiate/bzd detox, , and to provide referrals to appropriate residential setting once stabilized. At this time she agrees with this . -Consider transfer to inpatient psychiatric unit, once medically  stable . -Continue Ativan PRNs for potential withdrawal symptoms -Would taper Suboxone . (  If tapering Suboxone dose gradually, would not need Clonidine . If Suboxone is stopped, would continue Clonidine as per Opiate Withdrawal Protocol)  -Would taper off  Klonopin  -Consider starting Lexapro 10 mgrs QDAY, based on history of good response in the past    Disposition: Recommend psychiatric Inpatient admission when medically cleared.  Neita Garnet, MD 01/15/2016 3:05 PM

## 2016-01-16 DIAGNOSIS — F1990 Other psychoactive substance use, unspecified, uncomplicated: Secondary | ICD-10-CM | POA: Diagnosis not present

## 2016-01-16 DIAGNOSIS — R011 Cardiac murmur, unspecified: Secondary | ICD-10-CM | POA: Diagnosis not present

## 2016-01-16 DIAGNOSIS — F1920 Other psychoactive substance dependence, uncomplicated: Secondary | ICD-10-CM | POA: Diagnosis not present

## 2016-01-16 DIAGNOSIS — N179 Acute kidney failure, unspecified: Secondary | ICD-10-CM | POA: Diagnosis not present

## 2016-01-16 LAB — BASIC METABOLIC PANEL
ANION GAP: 5 (ref 5–15)
BUN: 13 mg/dL (ref 6–20)
CHLORIDE: 107 mmol/L (ref 101–111)
CO2: 25 mmol/L (ref 22–32)
CREATININE: 0.74 mg/dL (ref 0.44–1.00)
Calcium: 9.3 mg/dL (ref 8.9–10.3)
GFR calc non Af Amer: >60 mL/min (ref >60)
Glucose, Bld: 92 mg/dL (ref 65–99)
POTASSIUM: 3.3 mmol/L — AB (ref 3.5–5.1)
SODIUM: 137 mmol/L (ref 135–145)

## 2016-01-16 LAB — CBC
HCT: 39.9 % (ref 36.0–46.0)
HEMOGLOBIN: 13.1 g/dL (ref 12.0–15.0)
MCH: 28.7 pg (ref 26.0–34.0)
MCHC: 32.8 g/dL (ref 30.0–36.0)
MCV: 87.3 fL (ref 78.0–100.0)
PLATELETS: 237 10*3/uL (ref 150–400)
RBC: 4.57 MIL/uL (ref 3.87–5.11)
RDW: 12.6 % (ref 11.5–15.5)
WBC: 7 10*3/uL (ref 4.0–10.5)

## 2016-01-16 LAB — GC/CHLAMYDIA PROBE AMP (~~LOC~~) NOT AT ARMC
Chlamydia: NEGATIVE
Neisseria Gonorrhea: NEGATIVE

## 2016-01-16 MED ORDER — ENSURE ENLIVE PO LIQD
237.0000 mL | Freq: Two times a day (BID) | ORAL | Status: DC
  Administered 2016-01-16 – 2016-01-17 (×3): 237 mL via ORAL

## 2016-01-16 MED ORDER — LORAZEPAM 1 MG PO TABS
1.0000 mg | ORAL_TABLET | Freq: Once | ORAL | Status: AC
  Administered 2016-01-16: 1 mg via ORAL
  Filled 2016-01-16: qty 1

## 2016-01-16 MED ORDER — POTASSIUM CHLORIDE CRYS ER 20 MEQ PO TBCR
40.0000 meq | EXTENDED_RELEASE_TABLET | Freq: Every day | ORAL | Status: DC
  Administered 2016-01-17 – 2016-01-19 (×3): 40 meq via ORAL
  Filled 2016-01-16 (×3): qty 2

## 2016-01-16 NOTE — Progress Notes (Signed)
[facility], [facility], [facility], and [facility] do not have beds.   Maizie Garno LCSWA [phone]  

## 2016-01-16 NOTE — Progress Notes (Addendum)
PROGRESS NOTE  Savannah Palmer  HBZ:[id number] DOB: 1990/07/16 DOA: 01/12/2016 PCP: Default, Provider, MD  Brief Narrative:   Savannah Palmer is a 25 y.o. female with medical history significant of polysubstance abuse, IVDU.  Patient is brought in to ED by family due to AMS.  Patient reports that her insurance card recently stopped working on her suboxone and the cost went up to $80 per week.  Her father refused to pay for her suboxone because it was too expensive.  She started using IV heroin again and cocaine.  Last used cocaine a few weeks prior to admission and last used heroin just before going to her father's house.  She had been routinely taking her clonazepam and only recently stopped taking her suboxone. Was spending $60-$70 on heroin daily.    Assessment & Plan:   Principal Problem:   Murmur, heart Active Problems:   AKI (acute kidney injury) ([facility])   IVDU (intravenous drug user)   Polysubstance (including opioids) dependence, daily use ([facility])   Altered mental status  Toxic encephalopathy secondary to polysubstance abuse, resolved.  Benzo withdrawal, improved on clonazepam.   -  Continue clonazepam at 0.25mg  BID for now  Narcotic withdrawal, mild symptoms  -  Continue suboxone  -  Case management to assist with suboxone program affordability (insurance care no longer working and having to pay $80/week for med)  AKI, likely due to cocaine use and resolved with IVF  Heart murmur, new, in setting of IVDA, however, blood cultures are negative and transthoracic echocardiogram demonstrates no evidence of vegetation.  CT head negative for septic emboli.    Polysubstance abuse, IVDA -  RPR negative -  Hepatitis B negative -  Hep C Ab positive -  HIV NR -  GC/Ch needs to be resent to lab -  SW consult to assist   Hepatitis C ab positive -  HCV quant pending -  HCV genotype pending -  Will need Infectious Disease follow up.  Please check vaccination status of hepatitis A.   Will definitely need hepatitis B series again and may need hepatitis A vaccination again.    Hypokalemia, additional oral potassium repletion today.  Magnesium wnl   DVT prophylaxis:  Heparin  Code Status:  full Family Communication:  Patient alone Disposition Plan:  Awaiting inpatient psychiatric placement.  Patient medically stable for discharge.    Consultants:   Psychiatry  Procedures:  none  Antimicrobials:   none    Subjective:  Feeling less shaky and anxious today.  Still having some nausea and had some diarrhea this morning.     Objective: Vitals:   01/15/16 0602 01/15/16 1555 01/16/16 0005 01/16/16 0600  BP: 114/72 104/65 102/63 108/72  Pulse: (!) 53 (!) 59 (!) 49 66  Resp: 18 16 18 18   Temp: 98.2 F (36.8 C) 98.5 F (36.9 C) 98.2 F (36.8 C) 98.3 F (36.8 C)  TempSrc: Oral Oral Oral Oral  SpO2: 100% 99% 99% 100%  Weight:      Height:        Intake/Output Summary (Last 24 hours) at 01/16/16 1414 Last data filed at 01/16/16 0639  Gross per 24 hour  Intake              320 ml  Output                0 ml  Net              320 ml  Filed Weights   01/13/16 0246  Weight: 75.5 kg (166 lb 6.4 oz)    Examination:  General exam:  Adult female.  No acute distress.  HEENT:  NCAT, MMM, mild tongue fasciculations Respiratory system: Clear to auscultation bilaterally Cardiovascular system: Regular rate and rhythm, normal S1/S2. 1/6 murmur.  Warm extremities Gastrointestinal system: Normal active bowel sounds, soft, nondistended, nontender. MSK:  Normal tone and bulk, no lower extremity edema Neuro:  Grossly intact, mild tremor, improved from yesterday    Data Reviewed: I have personally reviewed following labs and imaging studies  CBC:  Recent Labs Lab 01/12/16 1823 01/13/16 0152 01/14/16 0453 01/15/16 0632 01/16/16 0841  WBC 18.6* 15.5* 13.1* 8.2 7.0  NEUTROABS 15.3*  --   --   --   --   HGB 17.1* 15.0 13.4 13.1 13.1  HCT 48.4* 44.1 40.1  39.1 39.9  MCV 84.2 87.3 86.2 87.3 87.3  PLT 374 360 334 257 237   Basic Metabolic Panel:  Recent Labs Lab 01/12/16 1823 01/13/16 0152 01/14/16 0453 01/15/16 0632 01/16/16 0841  NA 140 141 136 137 137  K 3.3* 3.5 2.9* 3.9 3.3*  CL 97* 101 101 106 107  CO2 27 27 24 24 25   GLUCOSE 143* 137* 96 88 92  BUN 36* 33* 22* 13 13  CREATININE 1.50* 1.16* 0.74 0.71 0.74  CALCIUM 11.0* 10.1 9.5 9.5 9.3  MG  --   --  2.5*  --   --    GFR: Estimated Creatinine Clearance: 104.5 mL/min (by C-G formula based on SCr of 0.8 mg/dL). Liver Function Tests:  Recent Labs Lab 01/12/16 1823  AST 27  ALT 14  ALKPHOS 108  BILITOT 0.8  PROT 9.6*  ALBUMIN 5.5*   CBG:  Recent Labs Lab 01/12/16 1808  GLUCAP 150*   Urine analysis:    Component Value Date/Time   COLORURINE AMBER (A) 01/12/2016 2247   APPEARANCEUR CLOUDY (A) 01/12/2016 2247   LABSPEC 1.025 01/12/2016 2247   PHURINE 6.0 01/12/2016 2247   GLUCOSEU NEGATIVE 01/12/2016 2247   HGBUR TRACE (A) 01/12/2016 2247   BILIRUBINUR SMALL (A) 01/12/2016 2247   KETONESUR 15 (A) 01/12/2016 2247   PROTEINUR 100 (A) 01/12/2016 2247   UROBILINOGEN 0.2 08/05/2010 1821   NITRITE NEGATIVE 01/12/2016 2247   LEUKOCYTESUR SMALL (A) 01/12/2016 2247   Sepsis Labs:   ) Recent Results (from the past 240 hour(s))  Blood culture (routine x 2)     Status: None (Preliminary result)   Collection Time: 01/12/16  6:20 PM  Result Value Ref Range Status   Specimen Description BLOOD RIGHT ANTECUBITAL  Final   Special Requests BOTTLES DRAWN AEROBIC ONLY 5CC  Final   Culture NO GROWTH 3 DAYS  Final   Report Status PENDING  Incomplete  Blood culture (routine x 2)     Status: None (Preliminary result)   Collection Time: 01/12/16  6:23 PM  Result Value Ref Range Status   Specimen Description BLOOD LEFT ARM  Final   Special Requests BOTTLES DRAWN AEROBIC ONLY 5CC  Final   Culture NO GROWTH 3 DAYS  Final   Report Status PENDING  Incomplete       Radiology Studies: No results found.   Scheduled Meds: . buprenorphine  8 mg Sublingual BID  . clonazePAM  0.25 mg Oral BID  . folic acid  1 mg Oral Daily  . heparin  5,000 Units Subcutaneous Q8H  . multivitamin with minerals  1 tablet Oral Daily  .  thiamine  100 mg Oral Daily   Continuous Infusions:    LOS: 0 days    Time spent: 30 min    Renae Fickle, MD [facility] Pager [phone]  If 7PM-7AM, please contact night-coverage [email or web address] Password [email or web address] 01/16/2016, 2:14 PM

## 2016-01-17 ENCOUNTER — Encounter (HOSPITAL_COMMUNITY): Payer: Self-pay | Admitting: General Practice

## 2016-01-17 DIAGNOSIS — R011 Cardiac murmur, unspecified: Secondary | ICD-10-CM | POA: Diagnosis not present

## 2016-01-17 DIAGNOSIS — R894 Abnormal immunological findings in specimens from other organs, systems and tissues: Secondary | ICD-10-CM | POA: Diagnosis not present

## 2016-01-17 DIAGNOSIS — F1990 Other psychoactive substance use, unspecified, uncomplicated: Secondary | ICD-10-CM | POA: Diagnosis not present

## 2016-01-17 DIAGNOSIS — R768 Other specified abnormal immunological findings in serum: Secondary | ICD-10-CM

## 2016-01-17 DIAGNOSIS — F1920 Other psychoactive substance dependence, uncomplicated: Secondary | ICD-10-CM | POA: Diagnosis not present

## 2016-01-17 LAB — CULTURE, BLOOD (ROUTINE X 2)
CULTURE: NO GROWTH
Culture: NO GROWTH

## 2016-01-17 MED ORDER — LORAZEPAM 0.5 MG PO TABS
0.5000 mg | ORAL_TABLET | Freq: Once | ORAL | Status: AC
  Administered 2016-01-17: 0.5 mg via ORAL
  Filled 2016-01-17: qty 1

## 2016-01-17 NOTE — Progress Notes (Signed)
Nutrition Brief Note  Patient identified on the Malnutrition Screening Tool (MST) Report  Wt Readings from Last 15 Encounters:  01/13/16 166 lb 6.4 oz (75.5 kg)   Savannah Palmer is a 25 y.o. female with medical history significant of polysubstance abuse, IVDU.  Patient is brought in to ED by family due to AMS today.  There is initial question of possible withdrawal / withdrawal seizure.  Patient has incontinence at home.  Patient is very inconsistent in history giving to the ED providers during her stay in the ED and story keeps changing.  Body mass index is 29.48 kg/m. Patient meets criteria for overweight based on current BMI.   Current diet order is regular, patient is consuming approximately 50-65% of meals at this time. Labs and medications reviewed.   No nutrition interventions warranted at this time. If nutrition issues arise, please consult RD.   Brigetta Beckstrom A. Mayford Knife, RD, LDN, CDE Pager: [phone] After hours Pager: [phone]

## 2016-01-17 NOTE — Progress Notes (Signed)
PROGRESS NOTE  Savannah Palmer  ZOX:[id number] DOB: [birth year] DOA: 01/12/2016 PCP: Default, Provider, MD  Brief Narrative:   Savannah Palmer is a 25 y.o. female with medical history significant of polysubstance abuse, IVDU.  Patient is brought in to ED by family due to AMS.  Patient reports that her insurance card recently stopped working on her suboxone and the cost went up to $80 per week.  Her father refused to pay for her suboxone because it was too expensive.  She started using IV heroin again and cocaine.  Last used cocaine a few weeks prior to admission and last used heroin just before going to her father's house.  She had been routinely taking her clonazepam and only recently stopped taking her suboxone. Was spending $60-$70 on heroin daily.    Assessment & Plan:   Principal Problem:   Murmur, heart Active Problems:   AKI (acute kidney injury) (HCC)   IVDU (intravenous drug user)   Polysubstance (including opioids) dependence, daily use (HCC)   Altered mental status  Toxic encephalopathy secondary to polysubstance abuse, resolved.  Benzo withdrawal, improved on clonazepam.   -  Continue clonazepam at 0.25mg  BID for now  Narcotic withdrawal, mild symptoms  -  Continue suboxone  -  Case management to assist with suboxone program affordability (insurance care no longer working and having to pay $80/week for med)  AKI, likely due to cocaine use and resolved with IVF  Heart murmur, new, in setting of IVDA, however, blood cultures are negative and transthoracic echocardiogram demonstrates no evidence of vegetation.  CT head negative for septic emboli.    Polysubstance abuse, IVDA -  RPR negative -  Hepatitis B negative -  Hep C Ab positive -  HIV NR -  GC/Ch needs to be resent to lab -  SW consult to assist   Hepatitis C ab positive -  HCV quant pending -  HCV genotype pending -  Will need Infectious Disease follow up.  Please check vaccination status of hepatitis A.   Will definitely need hepatitis B series again and may need hepatitis A vaccination again.    Hypokalemia, additional oral potassium repletion today.  Magnesium wnl   DVT prophylaxis:  Heparin  Code Status:  full Family Communication:  Patient alone Disposition Plan:  Awaiting inpatient psychiatric placement.  Patient medically stable for discharge.    Consultants:   Psychiatry  Procedures:  none  Antimicrobials:   none    Subjective:  Feeling less shaky and anxious today.  Nausea and diarrhea are better.    Objective: Vitals:   01/17/16 0517 01/17/16 1100 01/17/16 1333 01/17/16 2050  BP: (!) 99/49  (!) 97/53 108/65  Pulse: (!) 56 71 (!) 54 80  Resp: Temp: 98.2 F (36.8 C)  98.6 F (37 C) 98.2 F (36.8 C)  TempSrc:   Oral Oral  SpO2: 100%  98% 97%  Weight:      Height:       No intake or output data in the 24 hours ending 01/17/16 2108 Filed Weights   01/13/16 0246  Weight: 75.5 kg (166 lb 6.4 oz)    Examination:  General exam:  Adult female.  No acute distress.  HEENT:  NCAT, MMM, minimal tongue fasciculations Respiratory system: Clear to auscultation bilaterally Cardiovascular system: Regular rate and rhythm, normal S1/S2. 1/6 murmur.  Warm extremities Gastrointestinal system: Normal active bowel sounds, soft, nondistended, nontender. MSK:  Normal tone and bulk, no  lower extremity edema Neuro:  Grossly intact, very faint tremor, almost resolved    Data Reviewed: I have personally reviewed following labs and imaging studies  CBC:  Recent Labs Lab 01/12/16 1823 01/13/16 0152 01/14/16 0453 01/15/16 0632 01/16/16 0841  WBC 18.6* 15.5* 13.1* 8.2 7.0  NEUTROABS 15.3*  --   --   --   --   HGB 17.1* 15.0 13.4 13.1 13.1  HCT 48.4* 44.1 40.1 39.1 39.9  MCV 84.2 87.3 86.2 87.3 87.3  PLT 374 360 334 257 237   Basic Metabolic Panel:  Recent Labs Lab 01/12/16 1823 01/13/16 0152 01/14/16 0453 01/15/16 0632 01/16/16 0841  NA 140 141  136 137 137  K 3.3* 3.5 2.9* 3.9 3.3*  CL 97* 101 101 106 107  CO2 27 27 24 24 25   GLUCOSE 143* 137* 96 88 92  BUN 36* 33* 22* 13 13  CREATININE 1.50* 1.16* 0.74 0.71 0.74  CALCIUM 11.0* 10.1 9.5 9.5 9.3  MG  --   --  2.5*  --   --    GFR: Estimated Creatinine Clearance: 104.5 mL/min (by C-G formula based on SCr of 0.8 mg/dL). Liver Function Tests:  Recent Labs Lab 01/12/16 1823  AST 27  ALT 14  ALKPHOS 108  BILITOT 0.8  PROT 9.6*  ALBUMIN 5.5*   CBG:  Recent Labs Lab 01/12/16 1808  GLUCAP 150*   Urine analysis:    Component Value Date/Time   COLORURINE AMBER (A) 01/12/2016 2247   APPEARANCEUR CLOUDY (A) 01/12/2016 2247   LABSPEC 1.025 01/12/2016 2247   PHURINE 6.0 01/12/2016 2247   GLUCOSEU NEGATIVE 01/12/2016 2247   HGBUR TRACE (A) 01/12/2016 2247   BILIRUBINUR SMALL (A) 01/12/2016 2247   KETONESUR 15 (A) 01/12/2016 2247   PROTEINUR 100 (A) 01/12/2016 2247   UROBILINOGEN 0.2 08/05/2010 1821   NITRITE NEGATIVE 01/12/2016 2247   LEUKOCYTESUR SMALL (A) 01/12/2016 2247   Sepsis Labs:   ) Recent Results (from the past 240 hour(s))  Blood culture (routine x 2)     Status: None   Collection Time: 01/12/16  6:20 PM  Result Value Ref Range Status   Specimen Description BLOOD RIGHT ANTECUBITAL  Final   Special Requests BOTTLES DRAWN AEROBIC ONLY 5CC  Final   Culture NO GROWTH 5 DAYS  Final   Report Status 01/17/2016 FINAL  Final  Blood culture (routine x 2)     Status: None   Collection Time: 01/12/16  6:23 PM  Result Value Ref Range Status   Specimen Description BLOOD LEFT ARM  Final   Special Requests BOTTLES DRAWN AEROBIC ONLY 5CC  Final   Culture NO GROWTH 5 DAYS  Final   Report Status 01/17/2016 FINAL  Final      Radiology Studies: No results found.   Scheduled Meds: . buprenorphine  8 mg Sublingual BID  . clonazePAM  0.25 mg Oral BID  . folic acid  1 mg Oral Daily  . heparin  5,000 Units Subcutaneous Q8H  . multivitamin with minerals  1  tablet Oral Daily  . potassium chloride  40 mEq Oral Daily  . thiamine  100 mg Oral Daily   Continuous Infusions:    LOS: 0 days    Time spent: 30 min    Renae Fickle, MD [facility] Pager [phone]  If 7PM-7AM, please contact night-coverage [email or web address] Password [email or web address] 01/17/2016, 9:08 PM

## 2016-01-17 NOTE — Progress Notes (Addendum)
-  [facility] is at capacity but will call CSW when bed opens up. -[facility] did not answer phone. -Left message for [facility].  -[facility] is at capacity. -[facility] is at capacity.  -[facility] only has one [facility] bed. -Left message for [employer]. [facility] may have beds opening up. Faxed clinicals. [facility] may have beds opening up. Faxed clinicals. -[facility] will look at referral. Faxed clinicals.   Osborne Casco Taler Kushner LCSWA [phone]

## 2016-01-18 ENCOUNTER — Encounter (HOSPITAL_COMMUNITY): Payer: Self-pay | Admitting: *Deleted

## 2016-01-18 DIAGNOSIS — R011 Cardiac murmur, unspecified: Secondary | ICD-10-CM

## 2016-01-18 LAB — BASIC METABOLIC PANEL
Anion gap: 8 (ref 5–15)
CHLORIDE: 105 mmol/L (ref 101–111)
CO2: 24 mmol/L (ref 22–32)
Calcium: 9.3 mg/dL (ref 8.9–10.3)
Creatinine, Ser: 0.58 mg/dL (ref 0.44–1.00)
GFR calc Af Amer: >60 mL/min (ref >60)
GFR calc non Af Amer: >60 mL/min (ref >60)
GLUCOSE: 77 mg/dL (ref 65–99)
POTASSIUM: 4.6 mmol/L (ref 3.5–5.1)
Sodium: 137 mmol/L (ref 135–145)

## 2016-01-18 LAB — HEPATITIS C VRS RNA DETECT BY PCR-QUAL: Hepatitis C Vrs RNA by PCR-Qual: NEGATIVE

## 2016-01-18 MED ORDER — HYDROXYZINE HCL 25 MG PO TABS
25.0000 mg | ORAL_TABLET | Freq: Three times a day (TID) | ORAL | Status: DC | PRN
  Administered 2016-01-18 – 2016-01-19 (×2): 25 mg via ORAL
  Filled 2016-01-18 (×2): qty 1

## 2016-01-18 NOTE — Progress Notes (Signed)
[facility] and [facility] do not have beds.  Osborne Casco Arnika Larzelere LCSWA [phone]

## 2016-01-18 NOTE — Progress Notes (Signed)
At the request of administration have reconsulted Psychiatry for disposition recommendations given concerns about in patient stay.    PROGRESS NOTE  [name]  TTS:[id number] DOB: [birth year] DOA: 01/12/2016 PCP: Default, Provider, MD  Brief Narrative:   Savannah Palmer is a 25 y.o. female with medical history significant of polysubstance abuse, IVDU.  Patient is brought in to ED by family due to AMS.  Patient reports that her insurance card recently stopped working on her suboxone and the cost went up to $80 per week.  Her father refused to pay for her suboxone because it was too expensive.  She started using IV heroin again and cocaine.  Last used cocaine a few weeks prior to admission and last used heroin just before going to her father's house.  She had been routinely taking her clonazepam and only recently stopped taking her suboxone. Was spending $60-$70 on heroin daily.    Assessment & Plan:   Principal Problem:   Murmur, heart Active Problems:   AKI (acute kidney injury) (HCC)   IVDU (intravenous drug user)   Polysubstance (including opioids) dependence, daily use (HCC)   Altered mental status   Hepatitis C antibody test positive  Toxic encephalopathy secondary to polysubstance abuse, resolved.  Benzo withdrawal, improved on clonazepam.   -  Continue clonazepam at 0.25mg  BID for now  Narcotic withdrawal, mild symptoms  -  Continue suboxone  -  Case management to assist with suboxone program affordability (insurance care no longer working and having to pay $80/week for med)  AKI, likely due to cocaine use and resolved with IVF  Heart murmur, new, in setting of IVDA, however, blood cultures are negative and transthoracic echocardiogram demonstrates no evidence of vegetation.  CT head negative for septic emboli.    Polysubstance abuse, IVDA -  RPR negative -  Hepatitis B negative -  Hep C Ab positive -  HIV NR -  GC/Ch needs to be resent to lab -  SW consult to  assist   Hepatitis C ab positive -  HCV quant pending -  HCV genotype pending -  Will need Infectious Disease follow up.  Please check vaccination status of hepatitis A.  Will definitely need hepatitis B series again and may need hepatitis A vaccination again.    Hypokalemia - Was low on last check. Will repeat BMP today.  DVT prophylaxis:  Heparin  Code Status:  full Family Communication:  Patient alone Disposition Plan:  Awaiting inpatient psychiatric placement. AS mentioned at top of note. Awaiting psychiatry reevaluation.  Consultants:   Psychiatry  Procedures:  none  Antimicrobials:   none    Subjective:  Pt has no new complaints. No acute issues overnight.  Objective: Vitals:   01/17/16 1333 01/17/16 2050 01/18/16 0353 01/18/16 1331  BP: (!) 97/53 108/65 (!) 102/58 109/65  Pulse: (!) 54 80 76 62  Resp: 18 17 15    Temp: 98.6 F (37 C) 98.2 F (36.8 C) 97.9 F (36.6 C)   TempSrc: Oral Oral Oral   SpO2: 98% 97% 94% 100%  Weight:      Height:        Intake/Output Summary (Last 24 hours) at 01/18/16 1431 Last data filed at 01/18/16 1416  Gross per 24 hour  Intake              360 ml  Output                0 ml  Net  360 ml   Filed Weights   01/13/16 0246  Weight: 75.5 kg (166 lb 6.4 oz)    Examination:  General exam:  Awake and alert.  No acute distress.  HEENT:  NCAT, MMM, minimal tongue fasciculations Respiratory system: Clear to auscultation bilaterally. No wheezes Cardiovascular system: Regular rate and rhythm, normal S1/S2. 1/6 murmur.  Warm extremities Gastrointestinal system: Normal active bowel sounds, soft, nondistended, nontender. MSK:  Normal tone and bulk, no lower extremity edema Neuro:  Grossly intact, very faint tremor, almost resolved    Data Reviewed: I have personally reviewed following labs and imaging studies  CBC:  Recent Labs Lab 01/12/16 1823 01/13/16 0152 01/14/16 0453 01/15/16 0632 01/16/16 0841    WBC 18.6* 15.5* 13.1* 8.2 7.0  NEUTROABS 15.3*  --   --   --   --   HGB 17.1* 15.0 13.4 13.1 13.1  HCT 48.4* 44.1 40.1 39.1 39.9  MCV 84.2 87.3 86.2 87.3 87.3  PLT 374 360 334 257 237   Basic Metabolic Panel:  Recent Labs Lab 01/12/16 1823 01/13/16 0152 01/14/16 0453 01/15/16 0632 01/16/16 0841  NA 140 141 136 137 137  K 3.3* 3.5 2.9* 3.9 3.3*  CL 97* 101 101 106 107  CO2 27 27 24 24 25   GLUCOSE 143* 137* 96 88 92  BUN 36* 33* 22* 13 13  CREATININE 1.50* 1.16* 0.74 0.71 0.74  CALCIUM 11.0* 10.1 9.5 9.5 9.3  MG  --   --  2.5*  --   --    GFR: Estimated Creatinine Clearance: 104.5 mL/min (by C-G formula based on SCr of 0.8 mg/dL). Liver Function Tests:  Recent Labs Lab 01/12/16 1823  AST 27  ALT 14  ALKPHOS 108  BILITOT 0.8  PROT 9.6*  ALBUMIN 5.5*   CBG:  Recent Labs Lab 01/12/16 1808  GLUCAP 150*   Urine analysis:    Component Value Date/Time   COLORURINE AMBER (A) 01/12/2016 2247   APPEARANCEUR CLOUDY (A) 01/12/2016 2247   LABSPEC 1.025 01/12/2016 2247   PHURINE 6.0 01/12/2016 2247   GLUCOSEU NEGATIVE 01/12/2016 2247   HGBUR TRACE (A) 01/12/2016 2247   BILIRUBINUR SMALL (A) 01/12/2016 2247   KETONESUR 15 (A) 01/12/2016 2247   PROTEINUR 100 (A) 01/12/2016 2247   UROBILINOGEN 0.2 08/05/2010 1821   NITRITE NEGATIVE 01/12/2016 2247   LEUKOCYTESUR SMALL (A) 01/12/2016 2247   Sepsis Labs:   ) Recent Results (from the past 240 hour(s))  Blood culture (routine x 2)     Status: None   Collection Time: 01/12/16  6:20 PM  Result Value Ref Range Status   Specimen Description BLOOD RIGHT ANTECUBITAL  Final   Special Requests BOTTLES DRAWN AEROBIC ONLY 5CC  Final   Culture NO GROWTH 5 DAYS  Final   Report Status 01/17/2016 FINAL  Final  Blood culture (routine x 2)     Status: None   Collection Time: 01/12/16  6:23 PM  Result Value Ref Range Status   Specimen Description BLOOD LEFT ARM  Final   Special Requests BOTTLES DRAWN AEROBIC ONLY 5CC  Final    Culture NO GROWTH 5 DAYS  Final   Report Status 01/17/2016 FINAL  Final      Radiology Studies: No results found.   Scheduled Meds: . buprenorphine  8 mg Sublingual BID  . clonazePAM  0.25 mg Oral BID  . folic acid  1 mg Oral Daily  . heparin  5,000 Units Subcutaneous Q8H  . multivitamin with minerals  1 tablet Oral Daily  . potassium chloride  40 mEq Oral Daily  . thiamine  100 mg Oral Daily   Continuous Infusions:    LOS: 0 days    Time spent: 30 min    Penny Pia, MD [facility] Pager [phone]  If 7PM-7AM, please contact night-coverage [email or web address] Password [facility] 01/18/2016, 2:31 PM

## 2016-01-19 ENCOUNTER — Encounter (HOSPITAL_COMMUNITY): Payer: Self-pay

## 2016-01-19 ENCOUNTER — Inpatient Hospital Stay (HOSPITAL_COMMUNITY)
Admission: AD | Admit: 2016-01-19 | Discharge: 2016-01-23 | DRG: 897 | Disposition: A | Discharge status: Other Acute Inpatient Hospital | Payer: BLUE CROSS/BLUE SHIELD | Source: Intra-hospital | Attending: Psychiatry | Admitting: Psychiatry

## 2016-01-19 DIAGNOSIS — R894 Abnormal immunological findings in specimens from other organs, systems and tissues: Secondary | ICD-10-CM | POA: Diagnosis present

## 2016-01-19 DIAGNOSIS — F17210 Nicotine dependence, cigarettes, uncomplicated: Secondary | ICD-10-CM | POA: Diagnosis present

## 2016-01-19 DIAGNOSIS — F13229 Sedative, hypnotic or anxiolytic dependence with intoxication, unspecified: Secondary | ICD-10-CM | POA: Diagnosis present

## 2016-01-19 DIAGNOSIS — F1914 Other psychoactive substance abuse with psychoactive substance-induced mood disorder: Secondary | ICD-10-CM

## 2016-01-19 DIAGNOSIS — F1920 Other psychoactive substance dependence, uncomplicated: Secondary | ICD-10-CM | POA: Diagnosis present

## 2016-01-19 DIAGNOSIS — F11229 Opioid dependence with intoxication, unspecified: Principal | ICD-10-CM | POA: Diagnosis present

## 2016-01-19 DIAGNOSIS — F1120 Opioid dependence, uncomplicated: Secondary | ICD-10-CM

## 2016-01-19 DIAGNOSIS — R4182 Altered mental status, unspecified: Secondary | ICD-10-CM | POA: Diagnosis present

## 2016-01-19 DIAGNOSIS — R011 Cardiac murmur, unspecified: Secondary | ICD-10-CM | POA: Diagnosis not present

## 2016-01-19 DIAGNOSIS — T50904A Poisoning by unspecified drugs, medicaments and biological substances, undetermined, initial encounter: Secondary | ICD-10-CM | POA: Diagnosis not present

## 2016-01-19 LAB — GLUCOSE, CAPILLARY: Glucose-Capillary: 105 mg/dL — ABNORMAL HIGH (ref 65–99)

## 2016-01-19 LAB — HEPATITIS C GENOTYPE

## 2016-01-19 MED ORDER — NICOTINE 21 MG/24HR TD PT24
21.0000 mg | MEDICATED_PATCH | Freq: Every day | TRANSDERMAL | Status: DC
  Administered 2016-01-20 – 2016-01-21 (×2): 21 mg via TRANSDERMAL
  Filled 2016-01-19 (×5): qty 1

## 2016-01-19 MED ORDER — CHLORDIAZEPOXIDE HCL 25 MG PO CAPS: 25.0000 mg | ORAL_CAPSULE | ORAL | Status: DC

## 2016-01-19 MED ORDER — CHLORDIAZEPOXIDE HCL 25 MG PO CAPS
25.0000 mg | ORAL_CAPSULE | Freq: Four times a day (QID) | ORAL | Status: DC
  Administered 2016-01-19 – 2016-01-20 (×3): 25 mg via ORAL
  Filled 2016-01-19 (×3): qty 1

## 2016-01-19 MED ORDER — ACETAMINOPHEN 325 MG PO TABS
650.0000 mg | ORAL_TABLET | Freq: Four times a day (QID) | ORAL | Status: DC | PRN
  Administered 2016-01-20: 650 mg via ORAL
  Filled 2016-01-19: qty 2

## 2016-01-19 MED ORDER — ENSURE ENLIVE PO LIQD
237.0000 mL | Freq: Two times a day (BID) | ORAL | Status: DC
  Administered 2016-01-20 – 2016-01-23 (×6): 237 mL via ORAL

## 2016-01-19 MED ORDER — LOPERAMIDE HCL 2 MG PO CAPS: 2.0000 mg | ORAL_CAPSULE | ORAL | Status: AC | PRN

## 2016-01-19 MED ORDER — TRAZODONE HCL 50 MG PO TABS
50.0000 mg | ORAL_TABLET | Freq: Every day | ORAL | Status: DC
  Filled 2016-01-19 (×2): qty 1

## 2016-01-19 MED ORDER — MAGNESIUM HYDROXIDE 400 MG/5ML PO SUSP: 30.0000 mL | Freq: Every day | ORAL | Status: DC | PRN

## 2016-01-19 MED ORDER — HYDROXYZINE HCL 25 MG PO TABS
25.0000 mg | ORAL_TABLET | Freq: Four times a day (QID) | ORAL | Status: AC | PRN
  Administered 2016-01-19 – 2016-01-21 (×3): 25 mg via ORAL
  Filled 2016-01-19 (×3): qty 1

## 2016-01-19 MED ORDER — CHLORDIAZEPOXIDE HCL 25 MG PO CAPS: 25.0000 mg | ORAL_CAPSULE | Freq: Three times a day (TID) | ORAL | Status: DC

## 2016-01-19 MED ORDER — ADULT MULTIVITAMIN W/MINERALS CH
1.0000 | ORAL_TABLET | Freq: Every day | ORAL | Status: DC
  Administered 2016-01-19 – 2016-01-23 (×5): 1 via ORAL
  Filled 2016-01-19 (×8): qty 1

## 2016-01-19 MED ORDER — ALUM & MAG HYDROXIDE-SIMETH 200-200-20 MG/5ML PO SUSP: 30.0000 mL | ORAL | Status: DC | PRN

## 2016-01-19 MED ORDER — THIAMINE HCL 100 MG/ML IJ SOLN: 100.0000 mg | Freq: Once | INTRAMUSCULAR | Status: DC

## 2016-01-19 MED ORDER — THIAMINE HCL 100 MG PO TABS: 100.0000 mg | ORAL_TABLET | Freq: Every day | ORAL | Status: DC

## 2016-01-19 MED ORDER — BUPRENORPHINE HCL 8 MG SL SUBL: 8.0000 mg | SUBLINGUAL_TABLET | Freq: Two times a day (BID) | SUBLINGUAL | 0 refills | Status: DC

## 2016-01-19 MED ORDER — ADULT MULTIVITAMIN W/MINERALS CH: 1.0000 | ORAL_TABLET | Freq: Every day | ORAL | 0 refills | Status: DC

## 2016-01-19 MED ORDER — CHLORDIAZEPOXIDE HCL 25 MG PO CAPS: 25.0000 mg | ORAL_CAPSULE | Freq: Every day | ORAL | Status: DC

## 2016-01-19 MED ORDER — VITAMIN B-1 100 MG PO TABS
100.0000 mg | ORAL_TABLET | Freq: Every day | ORAL | Status: DC
  Administered 2016-01-20 – 2016-01-23 (×4): 100 mg via ORAL
  Filled 2016-01-19 (×7): qty 1

## 2016-01-19 MED ORDER — ONDANSETRON 4 MG PO TBDP: 4.0000 mg | ORAL_TABLET | Freq: Four times a day (QID) | ORAL | Status: AC | PRN

## 2016-01-19 MED ORDER — CHLORDIAZEPOXIDE HCL 25 MG PO CAPS
25.0000 mg | ORAL_CAPSULE | Freq: Four times a day (QID) | ORAL | Status: AC | PRN
  Administered 2016-01-20 – 2016-01-21 (×3): 25 mg via ORAL
  Filled 2016-01-19 (×3): qty 1

## 2016-01-19 MED ORDER — FOLIC ACID 1 MG PO TABS: 1.0000 mg | ORAL_TABLET | Freq: Every day | ORAL | Status: DC

## 2016-01-19 NOTE — Consult Note (Signed)
[facility] Face-to-Face Psychiatry Consult   Reason for Consult:  Substance abuse  Referring Physician:  Dr. Sheran Fava  Patient Identification: Savannah Palmer MRN:  [id number] Principal Diagnosis: Murmur, heart Diagnosis:   Patient Active Problem List   Diagnosis Date Noted  . Hepatitis C antibody test positive [R89.4] 01/17/2016  . AKI (acute kidney injury) ([place]) [N17.9] 01/13/2016  . IVDU (intravenous drug user) [F19.90] 01/13/2016  . Polysubstance (including opioids) dependence, daily use ([place]) [F19.20] 01/13/2016  . Murmur, heart [R01.1] 01/13/2016  . Altered mental status [R41.82]     Total Time spent with patient: 45 minutes  Subjective:   Savannah Palmer is a 25 y.o. female admitted with mental status changes .  HPI:  Savannah Palmer is a 25 years old single female admitted to call [facility] with altered mental status and possibly drug withdrawal seizures at home. Patient endorses increased symptoms of depression, sadness, dysphoria, hopelessness, helplessness and also worthlessness. Patient stated she has been anxious and scared about going through detox treatment because of her previous experience of withdrawal and withdrawal seizures. Patient reportedly ran out of her clonazepam and also her opiate. Patient reportedly was treated with Suboxone and then she cannot afford she goes back to heroin. Patient also experimented with cocaine from time to time. Patient reportedly seeking for treatment for acute symptoms of depression secondary to substance withdrawal symptoms especially opiates and benzodiazepines. Patient reportedly used her drug of abuse few days ago. Patient reportedly has periods of sober maybe one month to 2 months in the past. Patient reportedly has a group of friends associated with and are pending drugs. Patient mother is at bedside reported patient has done well and she was on treatment with Klonopin and Suboxone and worried she may go back to drug of abuse if she cannot  continue taking the same medication. Patient stated she is willing to do anything to change her life back to normal. Patient won't go back to school, and eventually job without falling back on drug of abuse. Patient has a history of inpatient substance abuse treatment at least twice in [place] couple of years ago. Patient parents separated 9 years ago and patient lives with father and [place]. Patient has been abusing drugs since age 14. Patient denies current active suicidal ideation and contracts for safety while in the hospital for treatment. Patient is willing to participate voluntarily to hospitalization for depression and also substance abuse treatment program.  Patienttienturrently on Suboxone 8 mgrs BID and on Klonopin 0.5 mgs BID. Patient and mother at bedside provided collateral information reports that she did the best when she went to a residential rehab setting  In the past .  Past Psychiatric History: history of polysubstance dependence, history of depression, history of panic disorder Patient states she has been on Zoloft, Celexa, Lexapro in the past, states Lexapro was helpful and well tolerated.  Risk to Self: Is patient at risk for suicide?: No Risk to Others:  no  Prior Inpatient Therapy:  yes Prior Outpatient Therapy:  yes   Past Medical History:  Past Medical History:  Diagnosis Date  . Anxiety   . Anxiety   . Depression     Past Surgical History:  Procedure Laterality Date  . CHOLECYSTECTOMY     Family History: History reviewed. No pertinent family history. Family Psychiatric  History:  Social History:  History  Alcohol Use  . Yes    Comment: occasinally socially     History  Drug Use  .  Types: Methamphetamines, Cocaine    Comment: Also Herion    Social History   Social History  . Marital status: Single    Spouse name: N/A  . Number of children: N/A  . Years of education: N/A   Social History Main Topics  . Smoking status: Current Some Day  Smoker  . Smokeless tobacco: Never Used  . Alcohol use Yes     Comment: occasinally socially  . Drug use:     Types: Methamphetamines, Cocaine     Comment: Also Herion  . Sexual activity: Yes    Birth control/ protection: Injection   Other Topics Concern  . None   Social History Narrative  . None   Additional Social History:    Allergies:  No Known Allergies  Labs:  Results for orders placed or performed during the hospital encounter of 01/12/16 (from the past 48 hour(s))  Basic metabolic panel     Status: Abnormal   Collection Time: 01/18/16  4:20 PM  Result Value Ref Range   Sodium 137 135 - 145 mmol/L   Potassium 4.6 3.5 - 5.1 mmol/L   Chloride 105 101 - 111 mmol/L   CO2 24 22 - 32 mmol/L   Glucose, Bld 77 65 - 99 mg/dL   BUN <5 (L) 6 - 20 mg/dL    Comment: REPEATED TO VERIFY   Creatinine, Ser 0.58 0.44 - 1.00 mg/dL   Calcium 9.3 8.9 - 10.3 mg/dL   GFR calc non Af Amer >60 >60 mL/min   GFR calc Af Amer >60 >60 mL/min    Comment: (NOTE) The eGFR has been calculated using the CKD EPI equation. This calculation has not been validated in all clinical situations. eGFR's persistently <60 mL/min signify possible Chronic Kidney Disease.    Anion gap 8 5 - 15  Glucose, capillary     Status: Abnormal   Collection Time: 01/19/16 12:59 AM  Result Value Ref Range   Glucose-Capillary 105 (H) 65 - 99 mg/dL    Current Facility-Administered Medications  Medication Dose Route Frequency Provider Last Rate Last Dose  . buprenorphine (SUBUTEX) sublingual tablet 8 mg  8 mg Sublingual BID Janece Canterbury, MD   8 mg at 01/19/16 0916  . clonazePAM (KLONOPIN) tablet 0.25 mg  0.25 mg Oral BID Janece Canterbury, MD   0.25 mg at 01/19/16 0916  . cloNIDine (CATAPRES) tablet 0.1 mg  0.1 mg Oral Q8H PRN Janece Canterbury, MD      . folic acid (FOLVITE) tablet 1 mg  1 mg Oral Daily Etta Quill, DO   1 mg at 01/19/16 0916  . heparin injection 5,000 Units  5,000 Units Subcutaneous Q8H Etta Quill, DO   5,000 Units at 01/19/16 7414  . hydrOXYzine (ATARAX/VISTARIL) tablet 25 mg  25 mg Oral TID PRN Velvet Bathe, MD   25 mg at 01/18/16 1354  . multivitamin with minerals tablet 1 tablet  1 tablet Oral Daily Etta Quill, DO   1 tablet at 01/19/16 [phone]  . potassium chloride SA (K-DUR,KLOR-CON) CR tablet 40 mEq  40 mEq Oral Daily Janece Canterbury, MD   40 mEq at 01/19/16 0917  . thiamine (VITAMIN B-1) tablet 100 mg  100 mg Oral Daily Etta Quill, DO   100 mg at 01/19/16 3202    Musculoskeletal: Strength & Muscle Tone: within normal limits Gait & Station: in bed, gait not examined  Patient leans: N/A  Psychiatric Specialty Exam: Physical Exam   ROS complaining of  stomach upset, nausea, tremors, generalized fatigue, body pains and  Sweating but denied shortness of breath and chest pain.  No Fever-chills, No Headache, No changes with Vision or hearing, reports vertigo No problems swallowing food or Liquids, No Chest pain, Cough or Shortness of Breath, No Blood in stool or Urine, No dysuria, No new skin rashes or bruises, No new joints pains-aches,  No new weakness, tingling, numbness in any extremity, No recent weight gain or loss, No polyuria, polydypsia or polyphagia,   A full 10 point Review of Systems was done, except as stated above, all other Review of Systems were negative.  Blood pressure 102/66, pulse 80, temperature 98 F (36.7 C), temperature source Oral, resp. rate 18, height _0  (1.6 m), weight 75.5 kg (166 lb 6.4 oz), SpO2 100 %.Body mass index is 29.48 kg/m.  General Appearance: fairly groomed   Eye Contact:  Good  Speech:  Slow  Volume:  Decreased  Mood:  Depressed  Affect:  Constricted and Tearful  Thought Process:  Linear  Orientation:  Other:  fully alert and attentive   Thought Content:  Denies hallucinations, no delusions expressed   Suicidal Thoughts:  denies plan or intention of suicide or self injurious ideations   Homicidal Thoughts:   No  Memory:  recent and remote grossly intact   Judgement:  Fair  Insight:  improving   Psychomotor Activity:  Decreased  Concentration:  Concentration: Good and Attention Span: NA  Recall:  Good  Fund of Knowledge:  Good  Language:  Good  Akathisia:  NA  Handed:  Right  AIMS (if indicated):     Assets:  Resilience Social Support  ADL's:  Fair   Cognition:  Fully alert , attentive at this time  Sleep:      Assessment: 25 year old female, history of polysubstance dependence, opiates identified as substance of choice . Recently stopped had stopped taking Suboxone, has been using IV heroin, and has been using BZDs . Of note, patient states she was using IV heroin at times even while on Suboxone. Patient had acute mental status changes which she thinks were related to withdrawal seizures. At this time last used substances 4-5 days ago. Of note, currently on Suboxone and low dose Klonopin.   She presents depressed, sad, tearful, with some passive thoughts of death, but no active SI. She wants to go to a residential  rehab to work on sobriety / relapse prevention. Currently on Suboxone- although fearful, she is wanting  to taper off .  Treatment Plan Summary: Case discussed with patient and patient mother regarding possible voluntary inpatient psychiatric hospitalization for depression and substance abuse treatment probably detox treatment.  Case discussed with staff RN  Case discussed with the social work Multimedia programmer the [facility]  Patient has bed available today for the transfer and will contact social service to complete the transfer process including consent for voluntary admission.   Patient would benefit from inpatient psychiatric admission for depression, anxiety management,  Opiate/bzd detox, , and to provide referrals to appropriate residential setting once stabilized. At this time she agrees with this . Consider transfer to  inpatient psychiatric unit - patient is medically stable at this time. Continue Ativan PRNs for potential withdrawal symptoms Would taper Suboxone . (  If tapering Suboxone dose gradually, would not need Clonidine . If Suboxone is stopped, would continue Clonidine as per Opiate Withdrawal Protocol)  Would taper off  Klonopin  Patient benefit from  starting Lexapro 10 mgrs QDAY, based on history of good response in the past  while being in the behavioral health Hospital   Disposition: Recommend psychiatric Inpatient admission when medically cleared.  Ambrose Finland, MD 01/19/2016 11:46 AM

## 2016-01-19 NOTE — Progress Notes (Signed)
Pt has bed at [facility]- has signed voluntary admission form- form faxed to [place] met with pt and mom and informed of admission for today and basic rules of facility (dress code etc)  Patient will discharge to [facility] rm [id number] Anticipated discharge date: 8/3 Family notified: pt mom at bedside Transportation by [facility]- called at 2:50pm  [place] signing off.  Domenica Reamer, [place] Social Worker [phone]

## 2016-01-19 NOTE — Tx Team (Signed)
Initial Interdisciplinary Treatment Plan   PATIENT STRESSORS: Financial difficulties Legal issue Medication change or noncompliance Substance abuse   PATIENT STRENGTHS: Communication skills General fund of knowledge   PROBLEM LIST: Problem List/Patient Goals Date to be addressed Date deferred Reason deferred Estimated date of resolution  Substance abuse 01/19/16     Depression 01/19/16     "Get therapy" 01/19/16     "Better understanding of myself" 01/19/16                                    DISCHARGE CRITERIA:  Improved stabilization in mood, thinking, and/or behavior Withdrawal symptoms are absent or subacute and managed without 24-hour nursing intervention  PRELIMINARY DISCHARGE PLAN: Outpatient therapy Medication management  PATIENT/FAMIILY INVOLVEMENT: This treatment plan has been presented to and reviewed with the patient, Savannah Palmer.  The patient and family have been given the opportunity to ask questions and make suggestions.  Norm Parcel Savannah Palmer 01/19/2016, 4:53 PM

## 2016-01-19 NOTE — Discharge Summary (Signed)
Physician Discharge Summary  Savannah Palmer:[id number] DOB: 11/10/1990 DOA: 01/12/2016  PCP: Default, Provider, MD  Admit date: 01/12/2016 Discharge date: 01/19/2016  Time spent: > 35 minutes  Recommendations for Outpatient Follow-up:  1.    Discharge Diagnoses:  Principal Problem:   Murmur, heart Active Problems:   AKI (acute kidney injury) (HCC)   IVDU (intravenous drug user)   Polysubstance (including opioids) dependence, daily use (HCC)   Altered mental status   Hepatitis C antibody test positive   Discharge Condition: stable  Diet recommendation:   Filed Weights   01/13/16 0246  Weight: 75.5 kg (166 lb 6.4 oz)    History of present illness:  Savannah L Crewsis a 25 y.o.femalewith medical history significant of polysubstance abuse, IVDU. Patient is brought in to ED by family due to AMS.  Patient reports that her insurance card recently stopped working on her suboxone and the cost went up to $80 per week.  Her father refused to pay for her suboxone because it was too expensive.  She started using IV heroin again and cocaine.  Last used cocaine a few weeks prior to admission and last used heroin just before going to her father's house.  She had been routinely taking her clonazepam and only recently stopped taking her suboxone.   Hospital Course:  Toxic encephalopathy secondary to polysubstance abuse, resolved.  Benzo withdrawal, improved on clonazepam.   -  Continue clonazepam at 0.25mg  BID for now  Narcotic withdrawal, mild symptoms  -  Continue suboxone  -  psychiatry to resume care at [facility]  AKI, likely due to cocaine use and resolved with IVF  Heart murmur: new, in setting of IVDA, however, blood cultures are negative and transthoracic echocardiogram demonstrates no evidence of vegetation.  CT head negative for septic emboli.    Polysubstance abuse, IVDA -  RPR negative -  Hepatitis B negative -  Hep C Ab positive -  HIV NR  Hepatitis C ab  positive -  HCV quant pending -  HCV genotype pending -  Will need Infectious Disease follow up.  Please check vaccination status of hepatitis A.  Will definitely need hepatitis B series again and may need hepatitis A vaccination again.    Hypokalemia - resolved  Procedures:  None  Consultations:  Psychiatrist  Discharge Exam: Vitals:   01/19/16 0100 01/19/16 0629  BP: (!) 105/59 102/66  Pulse: 78 80  Resp:    Temp: 99.1 F (37.3 C) 98 F (36.7 C)    General: Pt in nad, alert and awake Cardiovascular: no cyanosis Respiratory: no increased wob, no wheezes  Discharge Instructions   Discharge Instructions    Call MD for:  persistant dizziness or light-headedness    Complete by:  As directed   Call MD for:  temperature >100.4    Complete by:  As directed   Diet - low sodium heart healthy    Complete by:  As directed   Increase activity slowly    Complete by:  As directed     Current Discharge Medication List    START taking these medications   Details  buprenorphine (SUBUTEX) 8 MG SUBL SL tablet Place 1 tablet (8 mg total) under the tongue 2 (two) times daily. Qty: 60 each, Refills: 0    folic acid (FOLVITE) 1 MG tablet Take 1 tablet (1 mg total) by mouth daily.    Multiple Vitamin (MULTIVITAMIN WITH MINERALS) TABS tablet Take 1 tablet by mouth daily. Qty: 30 tablet, Refills:  0    thiamine 100 MG tablet Take 1 tablet (100 mg total) by mouth daily.      CONTINUE these medications which have NOT CHANGED   Details  clonazePAM (KLONOPIN) 0.5 MG tablet Take 0.5 mg by mouth 2 (two) times daily. Refills: 0      STOP taking these medications     Buprenorphine HCl-Naloxone HCl (ZUBSOLV) 5.7-1.4 MG SUBL      ibuprofen (ADVIL,MOTRIN) 400 MG tablet      ondansetron (ZOFRAN) 4 MG tablet      PARoxetine (PAXIL) 30 MG tablet        No Known Allergies    The results of significant diagnostics from this hospitalization (including imaging, microbiology,  ancillary and laboratory) are listed below for reference.    Significant Diagnostic Studies: Dg Chest 2 View  Result Date: 01/12/2016 CLINICAL DATA:  Altered mental status. Patient has not taken her anxiety medicine in 1 week. EXAM: CHEST  2 VIEW COMPARISON:  08/07/2010 FINDINGS: Normal heart, mediastinum and hila. Clear lungs.  No pleural effusion or pneumothorax. Skeletal structures are unremarkable. IMPRESSION: Normal chest radiographs. Electronically Signed   By: Amie Portland M.D.   On: 01/12/2016 18:49  Ct Head Wo Contrast  Result Date: 01/12/2016 CLINICAL DATA:  Altered mental status. Seizure. Patient is unresponsive. EXAM: CT HEAD WITHOUT CONTRAST TECHNIQUE: Contiguous axial images were obtained from the base of the skull through the vertex without intravenous contrast. COMPARISON:  CT scan dated 08/02/2010. FINDINGS: No mass lesion. No midline shift. No acute hemorrhage or hematoma. No extra-axial fluid collections. No evidence of acute infarction. Brain parenchyma is normal. Bones are normal. IMPRESSION: Normal exam. Electronically Signed   By: Francene Boyers M.D.   On: 01/12/2016 20:22   Microbiology: Recent Results (from the past 240 hour(s))  Blood culture (routine x 2)     Status: None   Collection Time: 01/12/16  6:20 PM  Result Value Ref Range Status   Specimen Description BLOOD RIGHT ANTECUBITAL  Final   Special Requests BOTTLES DRAWN AEROBIC ONLY 5CC  Final   Culture NO GROWTH 5 DAYS  Final   Report Status 01/17/2016 FINAL  Final  Blood culture (routine x 2)     Status: None   Collection Time: 01/12/16  6:23 PM  Result Value Ref Range Status   Specimen Description BLOOD LEFT ARM  Final   Special Requests BOTTLES DRAWN AEROBIC ONLY 5CC  Final   Culture NO GROWTH 5 DAYS  Final   Report Status 01/17/2016 FINAL  Final     Labs: Basic Metabolic Panel:  Recent Labs Lab 01/13/16 0152 01/14/16 0453 01/15/16 0632 01/16/16 0841 01/18/16 1620  NA 141 136 137 137 137  K  3.5 2.9* 3.9 3.3* 4.6  CL 101 101 106 107 105  CO2 27 24 24 25 24   GLUCOSE 137* 96 88 92 77  BUN 33* 22* 13 13 <5*  CREATININE 1.16* 0.74 0.71 0.74 0.58  CALCIUM 10.1 9.5 9.5 9.3 9.3  MG  --  2.5*  --   --   --    Liver Function Tests:  Recent Labs Lab 01/12/16 1823  AST 27  ALT 14  ALKPHOS 108  BILITOT 0.8  PROT 9.6*  ALBUMIN 5.5*   No results for input(s): LIPASE, AMYLASE in the last 168 hours. No results for input(s): AMMONIA in the last 168 hours. CBC:  Recent Labs Lab 01/12/16 1823 01/13/16 0152 01/14/16 0453 01/15/16 0632 01/16/16 0841  WBC 18.6* 15.5*  13.1* 8.2 7.0  NEUTROABS 15.3*  --   --   --   --   HGB 17.1* 15.0 13.4 13.1 13.1  HCT 48.4* 44.1 40.1 39.1 39.9  MCV 84.2 87.3 86.2 87.3 87.3  PLT 374 360 334 257 237   Cardiac Enzymes: No results for input(s): CKTOTAL, CKMB, CKMBINDEX, TROPONINI in the last 168 hours. BNP: BNP (last 3 results) No results for input(s): BNP in the last 8760 hours.  ProBNP (last 3 results) No results for input(s): PROBNP in the last 8760 hours.  CBG:  Recent Labs Lab 01/12/16 1808 01/19/16 0059  GLUCAP 150* 105*    Signed:  Penny Pia MD.  [facility] 01/19/2016, 2:29 PM

## 2016-01-19 NOTE — Care Management Note (Signed)
Case Management Note  Patient Details  Name: Savannah Palmer MRN: [id number] Date of Birth: [birth year]  Subjective/Objective:                    Action/Plan: Plan is to d/c today to [facility].  Expected Discharge Date:   01/19/2016          Expected Discharge Plan:  Psych[facility] Hospital  In-House Referral:  Clinical Social Work  Discharge planning Services  CM Consult   Status of Service:  Completed, signed off  If discussed at Long Length of Stay Meetings, dates discussed:    Additional Comments:  Epifanio Lesches, RN 01/19/2016, 2:45 PM

## 2016-01-19 NOTE — Progress Notes (Signed)
Patient with a bed at [facility]  Room 3001 Per psych doctor.  DR. Cena Benton and Social work made aware.

## 2016-01-19 NOTE — Progress Notes (Signed)
Patient did attend the evening karaoke group. Pt was engaged and supportive but did not participate by singing a song.    

## 2016-01-19 NOTE — Progress Notes (Signed)
Savannah Palmer is a 25 year old female being admitted voluntarily to [facility] from [facility] floor.  She was admitted on the med floor for altered mental status and possibly drug withdrawal seizures at home.  She reported that she ran out of her klonopin and her opiates at home.  She has long history of substance abuse and multiple IP treatments as well.  She is being treated with Suboxone in the community. She is diagnosed with Depressive disorder, Panic disorder and polysubstance dependence. She currently denies SI/HI or A/V hallucinations.   She reports feeling sad, anxious, hopelessness and sweating.  Admission paperwork completed and signed.  Belongings searched and secured in locker # 23.  Skin assessment completed and noted multiple tattoos, rash (healing) on right shoulder and nose ring in right nostril.  Q 15 minute checks initiated for safety.  We will monitor the progress towards her goals.

## 2016-01-19 NOTE — Progress Notes (Signed)
Patient discharge to [facility], all belongings taken with patient. Assisted to [facility] by transporter. Patient states she has anxiety, all questions answered. Report given to nurse at [facility]. [place]

## 2016-01-20 MED ORDER — CLONAZEPAM 0.5 MG PO TABS
0.2500 mg | ORAL_TABLET | Freq: Two times a day (BID) | ORAL | Status: DC
  Administered 2016-01-20 – 2016-01-23 (×6): 0.25 mg via ORAL
  Filled 2016-01-20 (×6): qty 1

## 2016-01-20 MED ORDER — NAPROXEN 500 MG PO TABS: 500.0000 mg | ORAL_TABLET | Freq: Two times a day (BID) | ORAL | Status: DC | PRN

## 2016-01-20 MED ORDER — BUPRENORPHINE HCL 8 MG SL SUBL
8.0000 mg | SUBLINGUAL_TABLET | Freq: Two times a day (BID) | SUBLINGUAL | Status: DC
  Administered 2016-01-20 – 2016-01-23 (×6): 8 mg via SUBLINGUAL
  Filled 2016-01-20 (×6): qty 1

## 2016-01-20 MED ORDER — CLONIDINE HCL 0.1 MG PO TABS
0.1000 mg | ORAL_TABLET | Freq: Four times a day (QID) | ORAL | Status: DC
  Filled 2016-01-20 (×8): qty 1

## 2016-01-20 MED ORDER — METHOCARBAMOL 500 MG PO TABS
500.0000 mg | ORAL_TABLET | Freq: Three times a day (TID) | ORAL | Status: DC | PRN
  Administered 2016-01-21 – 2016-01-22 (×2): 500 mg via ORAL
  Filled 2016-01-20 (×2): qty 1

## 2016-01-20 MED ORDER — CLONIDINE HCL 0.1 MG PO TABS: 0.1000 mg | ORAL_TABLET | ORAL | Status: DC

## 2016-01-20 MED ORDER — TRAZODONE HCL 50 MG PO TABS
50.0000 mg | ORAL_TABLET | Freq: Every evening | ORAL | Status: DC | PRN
  Administered 2016-01-20: 50 mg via ORAL
  Filled 2016-01-20: qty 1

## 2016-01-20 MED ORDER — DICYCLOMINE HCL 20 MG PO TABS
20.0000 mg | ORAL_TABLET | Freq: Four times a day (QID) | ORAL | Status: DC | PRN
  Administered 2016-01-20 – 2016-01-22 (×2): 20 mg via ORAL
  Filled 2016-01-20 (×2): qty 1

## 2016-01-20 MED ORDER — CITALOPRAM HYDROBROMIDE 10 MG PO TABS
10.0000 mg | ORAL_TABLET | Freq: Every day | ORAL | Status: DC
  Administered 2016-01-20 – 2016-01-23 (×4): 10 mg via ORAL
  Filled 2016-01-20: qty 14
  Filled 2016-01-20 (×6): qty 1

## 2016-01-20 MED ORDER — CLONIDINE HCL 0.1 MG PO TABS: 0.1000 mg | ORAL_TABLET | Freq: Every day | ORAL | Status: DC

## 2016-01-20 NOTE — H&P (Addendum)
Psychiatric Admission Assessment Adult  Patient Identification: Savannah Palmer MRN:  [id number] Date of Evaluation:  01/20/2016 Chief Complaint:  POLYSUBSTANCE DEPENDENCE -OPIOIDS INCLUDED-;DAILY Principal Diagnosis: Polysubstance dependence including opioid drug with daily use ([place]) Diagnosis:   Patient Active Problem List   Diagnosis Date Noted  . Polysubstance dependence including opioid drug with daily use ([place]) [F19.20] 01/19/2016  . Hepatitis C antibody test positive [R89.4] 01/17/2016  . AKI (acute kidney injury) ([place]) [N17.9] 01/13/2016  . IVDU (intravenous drug user) [F19.90] 01/13/2016  . Polysubstance (including opioids) dependence, daily use ([place]) [F19.20] 01/13/2016  . Murmur, heart [R01.1] 01/13/2016  . Altered mental status [R41.82]    History of Present Illness:  Savannah Palmer is a 25 year old female with history of polysubstance use, who was originally brought to ED by her family for altered mental status.   Per chart review, "Patient is very inconsistent in history giving to the ED providers during her stay in the ED and story keeps changing." She was on Suboxone but has stopped taking it as she was unable to afford it due to insurance issues. 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 She was placed on seizure precautions with concern that AMS was due to seizure from withdrwal; however, no overt withdrawal symptoms from benzodiazepine or opiates at ED.   Suboxone was resumed at ED since 7/29 with plan to titrate off; was on clonazepam 0.25 mg BID as well. UDS positive for opiates and cocaine.   [name] reports she feels depressed as she was not on Suboxone last night. She endorses rhinorrhea, muscle ache, yawning and is concerned that she might have a seizure again (informed of patient that opiate withdrwal does not cause seizure as benzodiazepine does.) . She sates that she missed to go to Suboxone clinic a week ago, as she was very concerned of her insurance. She  reports history of using opiate since teenager. She had treatment for detox twice in [place], last in 2015. She denies alcohol use or other drug use (per note, she abused on cocaine before). She denies SI/HI. She denies AH/VH.   Per consult note written by Dr. Louretta Shorten on 01/19/2016:  "Patient mother is at bedside reported patient has done well and she was on treatment with Klonopin and Suboxone and worried she may go back to drug of abuse if she cannot continue taking the same medication."  Associated Signs/Symptoms: Depression Symptoms:  depressed mood, insomnia, fatigue, anxiety, (Hypo) Manic Symptoms:  denies Anxiety Symptoms:  mild anxiety Psychotic Symptoms:  denies paranoia, no AH/VH PTSD Symptoms: Negative Total Time spent with patient: 45 minutes  Past Psychiatric History: polysubstance use disorder (opioids, benzodiazepine), bipolar disorder, "psychotic"  Is the patient at risk to self? No.  Has the patient been a risk to self in the past 6 months? No.  Has the patient been a risk to self within the distant past? No.  Is the patient a risk to others? No.  Has the patient been a risk to others in the past 6 months? No.  Has the patient been a risk to others within the distant past? No.   Prior Inpatient Therapy:    She had treatment for detox twice in [place], last in 2015.  Prior Outpatient Therapy:  sees provider and has been prescribed Suboxone and clonazepam  Alcohol Screening: 1. How often do you have a drink containing alcohol?: Never 9. Have you or someone else been injured as a result of your  drinking?: No 10. Has a relative or friend or a doctor or another health worker been concerned about your drinking or suggested you cut down?: No Alcohol Use Disorder Identification Test Final Score (AUDIT): 0 Brief Intervention: AUDIT score less than 7 or less-screening does not suggest unhealthy drinking-brief intervention not indicated Substance Abuse  History in the last 12 months:  Yes.   Consequences of Substance Abuse: Withdrawal Symptoms:   Headaches Tremors Previous Psychotropic Medications: Yes  Psychological Evaluations: Yes  Past Medical History:  Past Medical History:  Diagnosis Date  . Anxiety   . Anxiety   . Depression     Past Surgical History:  Procedure Laterality Date  . CHOLECYSTECTOMY     Family History: History reviewed. No pertinent family history. Family Psychiatric  History: Daughter attempted suicide, mother: depression, sister: anxiety Tobacco Screening: Have you used any form of tobacco in the last 30 days? (Cigarettes, Smokeless Tobacco, Cigars, and/or Pipes): Yes Tobacco use, Select all that apply: 5 or more cigarettes per day Are you interested in Tobacco Cessation Medications?: Yes, will notify MD for an order Counseled patient on smoking cessation including recognizing danger situations, developing coping skills and basic information about quitting provided: Refused/Declined practical counseling Social History:  History  Alcohol Use  . Yes    Comment: occasinally socially     History  Drug Use  . Types: Methamphetamines, Cocaine    Comment: Also Herion    Additional Social History: Education: high school. Attended college for a while but has not gone there as she lost her transportation.  She lives with her father and her step mother Patient parents separated 9 years ago       Pain Medications: Heroin Prescriptions: Subutex/klonopin Over the Counter: none History of alcohol / drug use?: Yes Negative Consequences of Use: Financial, Personal relationships Withdrawal Symptoms: Seizures Name of Substance 1: Heroin 1 - Age of First Use: unknown 1 - Amount (size/oz): unknown 1 - Frequency: daily 1 - Duration: few years 1 - Last Use / Amount: prior to medical admission                  Allergies:  No Known Allergies Lab Results:  Results for orders placed or performed during the  hospital encounter of 01/12/16 (from the past 48 hour(s))  Basic metabolic panel     Status: Abnormal   Collection Time: 01/18/16  4:20 PM  Result Value Ref Range   Sodium 137 135 - 145 mmol/L   Potassium 4.6 3.5 - 5.1 mmol/L   Chloride 105 101 - 111 mmol/L   CO2 24 22 - 32 mmol/L   Glucose, Bld 77 65 - 99 mg/dL   BUN <5 (L) 6 - 20 mg/dL    Comment: REPEATED TO VERIFY   Creatinine, Ser 0.58 0.44 - 1.00 mg/dL   Calcium 9.3 8.9 - 10.3 mg/dL   GFR calc non Af Amer >60 >60 mL/min   GFR calc Af Amer >60 >60 mL/min    Comment: (NOTE) The eGFR has been calculated using the CKD EPI equation. This calculation has not been validated in all clinical situations. eGFR's persistently <60 mL/min signify possible Chronic Kidney Disease.    Anion gap 8 5 - 15  Glucose, capillary     Status: Abnormal   Collection Time: 01/19/16 12:59 AM  Result Value Ref Range   Glucose-Capillary 105 (H) 65 - 99 mg/dL    Blood Alcohol level:  Lab Results  Component Value Date  ETH <5 01/12/2016   ETH  08/02/2010    <5        LOWEST DETECTABLE LIMIT FOR SERUM ALCOHOL IS 5 mg/dL FOR MEDICAL PURPOSES ONLY    Metabolic Disorder Labs:  No results found for: HGBA1C, MPG No results found for: PROLACTIN No results found for: CHOL, TRIG, HDL, CHOLHDL, VLDL, LDLCALC  Current Medications: Current Facility-Administered Medications  Medication Dose Route Frequency Provider Last Rate Last Dose  . acetaminophen (TYLENOL) tablet 650 mg  650 mg Oral Q6H PRN Encarnacion Slates, NP   650 mg at 01/20/16 0646  . alum & mag hydroxide-simeth (MAALOX/MYLANTA) 200-200-20 MG/5ML suspension 30 mL  30 mL Oral Q4H PRN Encarnacion Slates, NP      . chlordiazePOXIDE (LIBRIUM) capsule 25 mg  25 mg Oral Q6H PRN Encarnacion Slates, NP      . clonazePAM (KLONOPIN) tablet 0.25 mg  0.25 mg Oral BID Norman Clay, MD      . cloNIDine (CATAPRES) tablet 0.1 mg  0.1 mg Oral QID Norman Clay, MD       Followed by  . [START ON 01/22/2016] cloNIDine  (CATAPRES) tablet 0.1 mg  0.1 mg Oral BH-qamhs Norman Clay, MD       Followed by  . [START ON 01/25/2016] cloNIDine (CATAPRES) tablet 0.1 mg  0.1 mg Oral QAC breakfast Norman Clay, MD      . dicyclomine (BENTYL) tablet 20 mg  20 mg Oral Q6H PRN Norman Clay, MD      . feeding supplement (ENSURE ENLIVE) (ENSURE ENLIVE) liquid 237 mL  237 mL Oral BID BM Myer Peer Cobos, MD   237 mL at 01/20/16 0927  . hydrOXYzine (ATARAX/VISTARIL) tablet 25 mg  25 mg Oral Q6H PRN Encarnacion Slates, NP   25 mg at 01/19/16 2151  . loperamide (IMODIUM) capsule 2-4 mg  2-4 mg Oral PRN Encarnacion Slates, NP      . magnesium hydroxide (MILK OF MAGNESIA) suspension 30 mL  30 mL Oral Daily PRN Encarnacion Slates, NP      . methocarbamol (ROBAXIN) tablet 500 mg  500 mg Oral Q8H PRN Norman Clay, MD      . multivitamin with minerals tablet 1 tablet  1 tablet Oral Daily Encarnacion Slates, NP   1 tablet at 01/20/16 0925  . naproxen (NAPROSYN) tablet 500 mg  500 mg Oral BID PRN Norman Clay, MD      . nicotine (NICODERM CQ - dosed in mg/24 hours) patch 21 mg  21 mg Transdermal Q0600 Encarnacion Slates, NP   21 mg at 01/20/16 4680  . ondansetron (ZOFRAN-ODT) disintegrating tablet 4 mg  4 mg Oral Q6H PRN Encarnacion Slates, NP      . thiamine (B-1) injection 100 mg  100 mg Intramuscular Once Encarnacion Slates, NP      . thiamine (VITAMIN B-1) tablet 100 mg  100 mg Oral Daily Encarnacion Slates, NP   100 mg at 01/20/16 0924  . traZODone (DESYREL) tablet 50 mg  50 mg Oral QHS Encarnacion Slates, NP       PTA Medications: Prescriptions Prior to Admission  Medication Sig Dispense Refill Last Dose  . buprenorphine (SUBUTEX) 8 MG SUBL SL tablet Place 1 tablet (8 mg total) under the tongue 2 (two) times daily. 60 each 0 Past Week at Unknown time  . clonazePAM (KLONOPIN) 0.5 MG tablet Take 0.5 mg by mouth 2 (two) times daily.  0 Past Week at  Unknown time  . folic acid (FOLVITE) 1 MG tablet Take 1 tablet (1 mg total) by mouth daily.   01/19/2016 at Unknown time  . Multiple  Vitamin (MULTIVITAMIN WITH MINERALS) TABS tablet Take 1 tablet by mouth daily. 30 tablet 0 01/19/2016 at Unknown time  . thiamine 100 MG tablet Take 1 tablet (100 mg total) by mouth daily.   01/19/2016 at Unknown time    Musculoskeletal: Strength & Muscle Tone: within normal limits Gait & Station: normal Patient leans: N/A  Psychiatric Specialty Exam: Physical Exam  Constitutional: She is oriented to person, place, and time. She appears well-developed. She appears distressed.  Neurological: She is alert and oriented to person, place, and time.  No tremors    Review of Systems  Constitutional: Positive for chills, diaphoresis and malaise/fatigue.  Eyes: Negative for blurred vision.  Respiratory: Negative for shortness of breath.   Cardiovascular: Negative for palpitations.  Musculoskeletal: Positive for myalgias.  Neurological: Negative for tremors and headaches.  Psychiatric/Behavioral: Positive for depression. Negative for suicidal ideas. The patient is nervous/anxious.   All other systems reviewed and are negative.   Blood pressure 114/80, pulse 100, temperature 98.2 F (36.8 C), temperature source Oral, resp. rate 16, height _0  (1.6 m), weight 128 lb (58.1 kg), SpO2 99 %.Body mass index is 22.67 kg/m.  General Appearance: Casual  Eye Contact:  Good  Speech:  Normal Rate  Volume:  Normal  Mood:  Anxious  Affect:  Blunt  Thought Process:  Coherent inconsistent at times, concrete  Orientation:  Full (Time, Place, and Person)  Thought Content:  Logical, no paranoia. Perceptions: denies AH/VH  Suicidal Thoughts:  No   Homicidal Thoughts:  No  Memory:  NA  Judgement:  Fair  Insight:  Shallow  Psychomotor Activity:  Normal  Concentration:  Attention Span: Fair  Recall:  [employer] of Knowledge:  Fair  Language:  Fair  Akathisia:  No  Handed:  Ambidextrous  AIMS (if indicated):     Assets:  Desire for Improvement  ADL's:  Intact  Cognition:  Impaired,  Mild  Sleep:   Number of Hours: 5.5   Assessment Ms. Regal is a 25 year old female with history of polysubstance use (opiate, benzodiazepine), who was originally brought to ED by her family for altered mental status in the setting of opiate/benzodiazepine intoxication with concern for withdrawal.   # Opiate use disorder # Benzodiazepine use disorder Patient endorses physical symptoms of muscle ache, yawning, rhinorrhea, which are consistent with opiate withdrawal. Patient is motivated for sobriety. Although there is a concerning history of using Suboxone with IV heroin, will continue Suboxone given it was started at ED since 7/29. Discussed with pharmacy at [facility] regarding eligibility to dispense this medication ; it was approved. Dicussed with patient regarding the risk of using opiate/benzodiazepine which includes respiratory depression. Dicussed the plan to taper off Suboxone and clonazepam, which patient agrees. No significant autonomic signs except marginal tachycardia today.  # Substance induced mood disorder # r/o Unspecified anxiety disorder Patient endorses anxiety; she reports significant benefit from citalopram in the past; although it is difficult to discern whether she has baseline anxiety disorder, will start citalopram to target her mood, which would likely be helpful for abstinence from substance. No safety concerns.  Plans -Resume Subutex 8 mg BID (was on this dose at ED; discussed with [place] regarding eligibility) - Discontinue librium taper protocol - Start citalopram 10 mg daily - Start clonazepam 0.25 mg  BID (was on this dose before this admission) with plan to slowly taper off - Trazodone 50 mg qhsprn for insomnia  Treatment Plan Summary: Daily contact with patient to assess and evaluate symptoms and progress in treatment  Observation Level/Precautions:  15 minute checks  Laboratory:  as needed  Psychotherapy:  Group and individual therapy  Medications:  As  above  Consultations:  As needed  Discharge Concerns:  -  Estimated LOS: 5-7 days  Other:     I certify that inpatient services furnished can reasonably be expected to improve the patient's condition.    Norman Clay, MD 8/4/201711:15 AM

## 2016-01-20 NOTE — Progress Notes (Signed)
D: At the time of assessment, Pt endorsed mild anxiety; states, "It go better after going for karaoke. Pt is however worried; she states, "I can feel some running nose and cramping coming on; I feel that if I don't get my Subutex tomorrow I may go into a full scale withdrawal." Pt denied depression, pain, SI, HI or AVH. Pt remained calm and cooperative. A: Medications offered as prescribed.  Support, encouragement, and safe environment provided.  15-minute safety checks continue. R: Pt was med compliant.  Pt attended karaoke group. Safety checks continue.

## 2016-01-20 NOTE — BHH Suicide Risk Assessment (Addendum)
[facility] Admission Suicide Risk Assessment   Nursing information obtained from:  Patient Demographic factors:  Caucasian, Unemployed, Low socioeconomic status Current Mental Status:  NA Loss Factors:  none Historical Factors:  Impulsivity Risk Reduction Factors:  NA  Total Time spent with patient: 45 minutes Principal Problem: Polysubstance dependence including opioid drug with daily use (HCC) Diagnosis:   Patient Active Problem List   Diagnosis Date Noted  . Polysubstance dependence including opioid drug with daily use (HCC) [F19.20] 01/19/2016  . Hepatitis C antibody test positive [R89.4] 01/17/2016  . AKI (acute kidney injury) (HCC) [N17.9] 01/13/2016  . IVDU (intravenous drug user) [F19.90] 01/13/2016  . Polysubstance (including opioids) dependence, daily use (HCC) [F19.20] 01/13/2016  . Murmur, heart [R01.1] 01/13/2016  . Altered mental status [R41.82]    Subjective Data:  Savannah Palmer is a 25 year old female with history of polysubstance use (opiate, benzodiazepine), who was originally brought to ED by her family for altered mental status in the setting of opiate/benzodiazepine intoxication with concern for withdrawal.   She feels down that she has not been able to take Saboxone since admission. She reports insomnia last night. She endorses muscle cramp, yawning, diaphoreis. She denies SI/HI.   Continued Clinical Symptoms:  Alcohol Use Disorder Identification Test Final Score (AUDIT): 0 The "Alcohol Use Disorders Identification Test", Guidelines for Use in Primary Care, Second Edition.  World Science writer [facility]). Score between 0-7:  no or low risk or alcohol related problems. Score between 8-15:  moderate risk of alcohol related problems. Score between 16-19:  high risk of alcohol related problems. Score 20 or above:  warrants further diagnostic evaluation for alcohol dependence and treatment.   CLINICAL FACTORS:   Severe Anxiety and/or Agitation Depression:    Anhedonia Alcohol/Substance Abuse/Dependencies   Musculoskeletal: Strength & Muscle Tone: within normal limits Gait & Station: normal Patient leans: N/A  Psychiatric Specialty Exam: Physical Exam  Constitutional: She appears well-developed. She appears distressed.  Neurological: She is alert.  No tremors     Review of Systems  Constitutional: Positive for chills and malaise/fatigue.  Respiratory: Negative for shortness of breath.   Cardiovascular: Negative for chest pain.  Musculoskeletal: Positive for myalgias.  Psychiatric/Behavioral: Positive for depression. Negative for suicidal ideas. The patient is nervous/anxious.   All other systems reviewed and are negative.   Blood pressure (!) 97/54, pulse 100, temperature 98.2 F (36.8 C), temperature source Oral, resp. rate 16, height  (1.6 m), weight 128 lb (58.1 kg), SpO2 99 %.Body mass index is 22.67 kg/m.  General Appearance: Casual  Eye Contact:  Good  Speech:  Normal Rate  Volume:  Normal  Mood:  Anxious  Affect:  Blunt  Thought Process:  Coherent concrete, inconsistent at times  Orientation:  Full (Time, Place, and Person)  Thought Content:  no paranoia, no AH/VH  Suicidal Thoughts:  No  Homicidal Thoughts:  No  Memory:  NA  Judgement:  Fair  Insight:  Fair  Psychomotor Activity:  Normal  Concentration:  Concentration: Fair and Attention Span: Fair  Recall:  [employer] of Knowledge:  Fair  Language:  Fair  Akathisia:  No  Handed:  Ambidextrous  AIMS (if indicated):     Assets:  Desire for Improvement  ADL's:  Intact  Cognition:  Impaired,  Mild  Sleep:  Number of Hours: 5.5      COGNITIVE FEATURES THAT CONTRIBUTE TO RISK:  None    SUICIDE RISK:   Mild:  Suicidal ideation of limited frequency,  intensity, duration, and specificity.  There are no identifiable plans, no associated intent, mild dysphoria and related symptoms, good self-control (both objective and subjective assessment), few other risk  factors, and identifiable protective factors, including available and accessible social support.  She denies any intent and contracts for safety in the unit.   PLAN OF CARE: Patient will be admitted to inpatient psychiatric unit for stabilization and safety. Will provide and encourage milieu participation. Provide medication management and maked adjustments as needed.  Will follow daily.   I certify that inpatient services furnished can reasonably be expected to improve the patient's condition.  Neysa Hotter, MD 01/20/2016, 12:19 PM

## 2016-01-20 NOTE — BHH Counselor (Signed)
Adult Comprehensive Assessment  Patient ID: Savannah Palmer, female   DOB: 1991-01-02, 25 y.o.   MRN: [id number]  Information Source: Information source: Patient  Current Stressors:  Educational / Learning stressors: some college Employment / Job issues: unemployed for past six months. pt reports boredom in trigger Family Relationships: close to mom-who lives in [place] and lives with dad/stepmom-good relationship with both.  Financial / Lack of resources (include bankruptcy): assistnace from her parents; dad pays out of pocket for [employer]. Housing / Lack of housing: lives with stepmom and dad Physical health (include injuries & life threatening diseases): none identified Social relationships: poor-"I need to find a new group of friends." Substance abuse: pt reports that she has been using suboxone and gets prescribed klonipin by PCP--ran out a few weeks ago/unable to afford and relapsed on heroin 1/2 gram IV daily for 2 weeks. no other substance use identified.  Bereavement / Loss: none identified.   Living/Environment/Situation:  Living Arrangements: Parent Living conditions (as described by patient or guardian): pt lives with dad and step mom How long has patient lived in current situation?: on and off for several years.  What is atmosphere in current home: Comfortable, Loving, Supportive  Family History:  Marital status: Single Are you sexually active?: No What is your sexual orientation?: heterosexual Has your sexual activity been affected by drugs, alcohol, medication, or emotional stress?: n/a  Does patient have children?: No  Childhood History:  By whom was/is the patient raised?: Both parents Additional childhood history information: parents divorced when pt was young. Lived mostly with her father. mom moved to [place]. still close and talk on the phone. Close to stepmother as well. pt reports her mother has hx of cocaine abuse "but nothing in 15 years."  Description of  patient's relationship with caregiver when they were a child: close to both parents; raised primarily by her father Patient's description of current relationship with people who raised him/her: close to dad, mom, and stepmom How were you disciplined when you got in trouble as a child/adolescent?: n/a  Does patient have siblings?: Yes Number of Siblings: 2 Description of patient's current relationship with siblings: two haf sisters. good relationship. no substance abuse or mental health issues with siblings.  Did patient suffer any verbal/emotional/physical/sexual abuse as a child?: No Did patient suffer from severe childhood neglect?: No Has patient ever been sexually abused/assaulted/raped as an adolescent or adult?: No Was the patient ever a victim of a crime or a disaster?: No Witnessed domestic violence?: No Has patient been effected by domestic violence as an adult?: No  Education:  Highest grade of school patient has completed: some college Currently a Consulting civil engineer?: No Learning disability?: No  Employment/Work Situation:   Employment situation: Unemployed (pt has been unemployed for the past six months) Patient's job has been impacted by current illness: No What is the longest time patient has a held a job?: few years Where was the patient employed at that time?: waitress  Has patient ever been in the [employer]?: No Has patient ever served in combat?: No Did You Receive Any Psychiatric Treatment/Services While in Equities trader?: No Are There Guns or Other Weapons in Your Home?: No Are These Comptroller?: No  Financial Resources:   Surveyor, quantity resources: Support from parents / caregiver, Media planner Does patient have a Lawyer or guardian?: No  Alcohol/Substance Abuse:   What has been your use of drugs/alcohol within the last 12 months?: pt has extensive hx of heroin  abuse. has been prescribed suboxone since Jan 2017 and klonipin (by PCP). Pt reports that  2 weeks ago she stopped being able to pay for meds-relapsed on herion (IV use 1/2 gram daily on average). no other substance use reported by pt.  If attempted suicide, did drugs/alcohol play a role in this?: No Alcohol/Substance Abuse Treatment Hx: Past Tx, Outpatient, Attends AA/NA If yes, describe treatment: Pt reports she has PCP at [facility] and was going to [facility] [employer] would like to start seeing counselor at that agency after d/c.  Has alcohol/substance abuse ever caused legal problems?: Yes (pt has court date Aug 13th for cocaine possession.)  Social Support System:   [employer] Support System: Poor Describe Community Support System: pt reports poor network of friends but good family supports. "I just need to find a new group of friends."  Type of faith/religion: n/a  How does patient's faith help to cope with current illness?: n/a   Leisure/Recreation:   Leisure and Hobbies: "I need to find hobbies. I'm bored alot and that's a trigger for me."   Strengths/Needs:   What things does the patient do well?: motivated to get clean and get back on suboxone. find work In what areas does patient struggle / problems for patient: unemployed; lots of spare time-trigger. recent relapse.   Discharge Plan:   Does patient have access to transportation?: Yes (family. "I have a license but no car so I rely on my family.") Will patient be returning to same living situation after discharge?: Yes (home with dad and stepmom) Currently receiving community mental health services: Yes (From Whom) (PCP -[facility] and [facility] [employer].) If no, would patient like referral for services when discharged?: No (pt would like to resume services with current providers and has allowed CSW to make follow-up appts.) Does patient have financial barriers related to discharge medications?: No  Summary/Recommendations:    Summary and Recommendations (to be completed by the evaluator): Patient is 25 year old female living in [place], [place] She presents to the hospital after having a seizure due to not taking her klonipin/benzo (prescribed). Patient also reports recent heroin relapse due to not being able to afford suboxone prescription for the past two weeks. Patient denies depression and SI/HI/AVH but reports some anxiety "mostly because of withdrawals." Patient sees PCP at [facility] and had been going to [facility] for suboxone treatment and would like to resume services with her current providers upon discharge. Patient is currently unemployed; single; no children. Recommendations for patient include: therapeutic milieu, encourage group attendance and participation, medication management for withdrawals/mood stabilization, and development of comprehensive mental wellness/sobriety plan.   Smart, Shizue Kaseman LCSW 01/20/2016 11:35 AM

## 2016-01-20 NOTE — Progress Notes (Signed)
Patient attended AA group meeting.  

## 2016-01-20 NOTE — Progress Notes (Signed)
NUTRITION ASSESSMENT  Pt identified as at risk on the Malnutrition Screen Tool  INTERVENTION: 1. Educated patient on the importance of nutrition and encouraged intake of food and beverages. 2. Discussed weight goals. 3. Supplements: continue Ensure Enlive po BID, each supplement provides 350 kcal and 20 grams of protein   NUTRITION DIAGNOSIS: Unintentional weight loss related to sub-optimal intake as evidenced by pt report.   Goal: Pt to meet >/= 90% of their estimated nutrition needs.  Monitor:  PO intake  Assessment:  Pt currently ordered Ensure Enlive BID. Per chart review, pt has lost 38 lbs (58% body weight) in the past 6 days; question accuracy of weight difference between 7/28 and 8/3. Continue Ensure BID and continue to encourage pt with meals and snacks.  25 y.o. female  Height: Ht Readings from Last 1 Encounters:  01/19/16 5\' 3"  (1.6 m)    Weight: Wt Readings from Last 1 Encounters:  01/19/16 128 lb (58.1 kg)    Weight Hx: Wt Readings from Last 10 Encounters:  01/19/16 128 lb (58.1 kg)  01/13/16 166 lb 6.4 oz (75.5 kg)    BMI:  Body mass index is 22.67 kg/m. Pt meets criteria for normal weight based on current BMI.  Estimated Nutritional Needs: Kcal: 25-30 kcal/kg Protein: > 1 gram protein/kg Fluid: 1 ml/kcal  Diet Order: Diet regular Room service appropriate? Yes; Fluid consistency: Thin Pt is also offered choice of unit snacks mid-morning and mid-afternoon.  Pt is eating as desired.   Lab results and medications reviewed.     Trenton Gammon, MS, RD, LDN Inpatient Clinical Dietitian Pager # [phone] After hours/weekend pager # [phone]

## 2016-01-20 NOTE — Plan of Care (Signed)
Problem: Education: Goal: Knowledge of the prescribed therapeutic regimen will improve Outcome: Progressing Patient has been learning her medication regime, and is able toname some of her medications and their indications.

## 2016-01-20 NOTE — Progress Notes (Signed)
Data. Patient denies SI/HI/AVH.   Patient interacting well with staff and other patients.  Action. Emotional support and encouragement offered. Education provided on medication, indications and side effect. Q 15 minute checks done for safety. Patient continues to request her suboxone. On her self inventory paient reported 4/10 for depression, 3/10 for hopelessness and 8/10 for anxiety. Her goal today is: "Mental health therapy. See doctor".  Response. Safety on the unit maintained through 15 minute checks.  Medications taken as prescribed. Attended groups. Remained calm and appropriate through out shift.

## 2016-01-20 NOTE — BHH Group Notes (Signed)
BHH LCSW Group Therapy  01/20/2016 12:54 PM  Type of Therapy:  Group Therapy  Participation Level:  Minimal  Participation Quality:  Attentive  Affect:  Appropriate  Cognitive:  Alert and Oriented  Insight:  Improving  Engagement in Therapy:  Improving  Modes of Intervention:  Discussion, Education, Exploration, Problem-solving, Rapport Building, Socialization and Support  Summary of Progress/Problems: Feelings around Relapse. Group members discussed the meaning of relapse and shared personal stories of relapse, how it affected them and others, and how they perceived themselves during this time. Group members were encouraged to identify triggers, warning signs and coping skills used when facing the possibility of relapse. Social supports were discussed and explored in detail. Savannah Palmer was attentive and engaged during today's processing group. She shared that she relapsed on heroin after not being able to afford suboxone at [facility]. "I just want to be put back on my suboxone and start fresh." She reports no depression or anxiety issues and is hoping to "get out of the house and find work." Savannah Palmer identified loneliness and "especially boredom" as triggers for relapse.   Smart, Merryl Buckels LCSW 01/20/2016, 12:54 PM

## 2016-01-20 NOTE — Progress Notes (Signed)
Recreation Therapy Notes  Date: 01/20/16 Time: 0930 Location: 300 Hall Group Room  Group Topic: Stress Management  Goal Area(s) Addresses:  Patient will verbalize importance of using healthy stress management.  Patient will identify positive emotions associated with healthy stress management.   Behavioral Response: Engaged  Intervention: Stress Management  Activity :  Peaceful Waves Guided Imagery.  LRT introduced to the technique of guided imagery to the patients.  Patients were asked to follow along with LRT as a script was read to engage in the technique.  Education:  Stress Management, Discharge Planning.   Education Outcome: Acknowledges edcuation/In group clarification offered/Needs additional education  Clinical Observations/Feedback: Pt attended group and stated she enjoyed it.    Savannah Palmer, LRT/CTRS    Savannah Palmer A 01/20/2016 12:49 PM

## 2016-01-20 NOTE — Tx Team (Signed)
Interdisciplinary Treatment Plan Update (Adult)  Date:  01/20/2016  Time Reviewed:  8:35 AM   Progress in Treatment: Attending groups: No. New to unit. Continuing to assess.  Participating in groups:  No. Taking medication as prescribed:  Yes. Tolerating medication:  Yes. Family/Significant othe contact made:  Pt denies SI/HI/AVH upon admission. Family contact not required.  Patient understands diagnosis:  Yes.AEB seeking treatment for polysubstance abuse, depression, seizure/withdrawals, and for medication stabilization.  Discussing patient identified problems/goals with staff:  Yes. Medical problems stabilized or resolved:  Yes. Denies suicidal/homicidal ideation: Yes. Issues/concerns per patient self-inventory:  Other:  Discharge Plan or Barriers: CSW assessing for appropriate referrals. Pt reports that she goes to suboxone clinic and wants to continue suboxone while at the hospital. MD/RN aware of pt request.   Reason for Continuation of Hospitalization: Depression Medication stabilization Withdrawal symptoms  Comments:  Female is a 25 year old female being admitted voluntarily to [facility] from [facility] floor.  She was admitted on the med floor for altered mental status and possibly drug withdrawal seizures at home.  She reported that she ran out of her klonopin and her opiates at home.  She has long history of substance abuse and multiple IP treatments as well.  She is being treated with Suboxone in the community. She is diagnosed with Depressive disorder, Panic disorder and polysubstance dependence. She currently denies SI/HI or A/V hallucinations.   She reports feeling sad, anxious, hopelessness and sweating.  Estimated length of stay:  3-5 days   New goal(s): to develop effective aftercare plan.   Additional Comments:  Patient and CSW reviewed pt's identified goals and treatment plan. Patient verbalized understanding and agreed to treatment plan. CSW reviewed [facility] "Discharge  Process and Patient Involvement" Form. Pt verbalized understanding of information provided and signed form.    Review of initial/current patient goals per problem list:  1. Goal(s): Patient will participate in aftercare plan  Met: No.   Target date: at discharge  As evidenced by: Patient will participate within aftercare plan AEB aftercare provider and housing plan at discharge being  identified.    8/4: CSW assessing for appropriate referrals.   2. Goal (s): Patient will exhibit decreased depressive symptoms and suicidal ideations.  Met: No.    Target date: at discharge  As evidenced by: Patient will utilize self rating of depression at 3 or below and demonstrate decreased signs of depression or be deemed stable for discharge by MD.  8/4: Pt rates depression as high. Continues to deny SI/HI/AVH.   4. Goal(s): Patient will demonstrate decreased signs of withdrawal due to substance abuse  Met: No.   Target date:at discharge   As evidenced by: Patient will produce a CIWA/COWS score of 0, have stable vitals signs, and no symptoms of withdrawal.  8/4: Pt reports mild/moderate withdrawals with CIWA of 5/COWS of 4 and stable vitals.   Attendees: Patient:   01/20/2016 8:35 AM   Family:   01/20/2016 8:35 AM   Physician:  Dr. Shea Evans; Dr. Parke Poisson; Dr. Elie Goody MD 01/20/2016 8:35 AM   Nursing:   Alfonzo Beers RN 01/20/2016 8:35 AM   Clinical Social Worker: Maxie Better, LCSW 01/20/2016 8:35 AM   Clinical Social Worker: Erasmo Downer Drinkard LCSW 01/20/2016 8:35 AM   Other:  Gerline Legacy Nurse Case Manager 01/20/2016 8:35 AM   Other:  Agustina Caroli NP 01/20/2016 8:35 AM   Other:   01/20/2016 8:35 AM   Other:  01/20/2016 8:35 AM   Other:  01/20/2016 8:35 AM  Other:  01/20/2016 8:35 AM    01/20/2016 8:35 AM    01/20/2016 8:35 AM    01/20/2016 8:35 AM    01/20/2016 8:35 AM    Scribe for Treatment Team:   Maxie Better, LCSW 01/20/2016 8:35 AM

## 2016-01-21 DIAGNOSIS — F1920 Other psychoactive substance dependence, uncomplicated: Secondary | ICD-10-CM

## 2016-01-21 MED ORDER — HYDROCORTISONE 0.5 % EX CREA
TOPICAL_CREAM | Freq: Three times a day (TID) | CUTANEOUS | Status: DC
  Administered 2016-01-21 – 2016-01-22 (×3): via TOPICAL
  Administered 2016-01-22: 1 via TOPICAL
  Administered 2016-01-22 – 2016-01-23 (×2): via TOPICAL
  Filled 2016-01-21: qty 28.35

## 2016-01-21 MED ORDER — GABAPENTIN 300 MG PO CAPS
300.0000 mg | ORAL_CAPSULE | Freq: Three times a day (TID) | ORAL | Status: DC
Start: 2016-01-21 — End: 2016-01-23
  Administered 2016-01-21 – 2016-01-23 (×7): 300 mg via ORAL
  Filled 2016-01-21: qty 42
  Filled 2016-01-21: qty 1
  Filled 2016-01-21: qty 42
  Filled 2016-01-21 (×8): qty 1
  Filled 2016-01-21: qty 42

## 2016-01-21 MED ORDER — TRAZODONE HCL 100 MG PO TABS
100.0000 mg | ORAL_TABLET | Freq: Every day | ORAL | Status: DC
  Administered 2016-01-21 – 2016-01-22 (×2): 100 mg via ORAL
  Filled 2016-01-21: qty 14
  Filled 2016-01-21 (×4): qty 1

## 2016-01-21 MED ORDER — NICOTINE POLACRILEX 2 MG MT GUM
2.0000 mg | CHEWING_GUM | OROMUCOSAL | Status: DC | PRN
Start: 2016-01-21 — End: 2016-01-23
  Administered 2016-01-21 – 2016-01-23 (×6): 2 mg via ORAL
  Filled 2016-01-21: qty 1

## 2016-01-21 NOTE — Progress Notes (Signed)
D: Pt visible in dayroom for majority of this shift. Presents with flat affect and depressed. Forwards and brightens up during conversations. Denies SI, HI, AVH and pain when assessed. Reported poor sleep last night related to room mate actively responding to internal stimuli ("she kept talking to herself all night"). Observed interacting well with peers and staff. Rates her depression 3/10, hopelessness 2/10 and anxiety 7/10.  A: Scheduled and PRN medications administered as prescribed. Emotional support and availability provided to pt. Verbally encouraged pt to voice concerns. Q 15 minutes checks maintained for safety without outburst or self harm gestures thus far this shift.  R: Pt receptive to care. Compliant with medications; denies adverse drug reactions. Attended scheduled groups. Went off unit for recreational activities and meals, returned to unit without issues. POC continues.

## 2016-01-21 NOTE — BHH Group Notes (Signed)
B[facility] Group Notes:  (Clinical Social Work)   04/16/2015     10:00-11:00AM  Summary of Progress/Problems:   In today's process group a decisional balance exercise was used to explore in depth the perceived benefits and costs of alcohol and drugs, as well as the  benefits and costs of replacing these with healthy coping skills.  Patients listed healthy and unhealthy coping techniques, particularly those that they utilize currently.  Motivational Interviewing and the whiteboard were utilized for the exercises.  The patient expressed that the (1) healthy and (2) unhealthy coping she often uses are (1) meditation and music and (2) isolation.  She participated fully and with insight throughout group.  Type of Therapy:  Group Therapy - Process   Participation Level:  Active  Participation Quality:  Attentive and Sharing  Affect:  Appropriate  Cognitive:  Appropriate  Insight:  Engaged  Engagement in Therapy:  Engaged  Modes of Intervention:  Education, Motivational Interviewing  Ambrose Mantle, LCSW 01/21/2016, 3:53 PM

## 2016-01-21 NOTE — Progress Notes (Signed)
[facility] MD Progress Note  01/21/2016 4:07 PM Savannah Palmer  MRN:  [id number]  Subjective:  [place]  Reports, "I did not sleep good last night".  Objective: Patient is seen and chart reviewed. She is alert, oriented x 3. She is are of situation. She says she did not sleep well last night due to Roommate. Says would like to take a nap after lunch. She is pleasant during assessments and appears to be engaged in treatment. Denies any suicidal thoughts. Patient is motivated to get better and has future goals. She says she calling different substance abuse treatment centers. Increased the Trazodone to 100 mg Q hs routinely.  Principal Problem: Polysubstance dependence including opioid drug with daily use (HCC)  Diagnosis:   Patient Active Problem List   Diagnosis Date Noted  . Polysubstance dependence including opioid drug with daily use (HCC) [F19.20] 01/19/2016  . Hepatitis C antibody test positive [R89.4] 01/17/2016  . AKI (acute kidney injury) (HCC) [N17.9] 01/13/2016  . IVDU (intravenous drug user) [F19.90] 01/13/2016  . Polysubstance (including opioids) dependence, daily use (HCC) [F19.20] 01/13/2016  . Murmur, heart [R01.1] 01/13/2016  . Altered mental status [R41.82]    Total Time spent with patient: 25 minutes  Past Psychiatric History: Opioid use disorder  Past Medical History:  Past Medical History:  Diagnosis Date  . Anxiety   . Anxiety   . Depression     Past Surgical History:  Procedure Laterality Date  . CHOLECYSTECTOMY     Family History: History reviewed. No pertinent family history. Family Psychiatric  History: See H&P  Social History:  History  Alcohol Use  . Yes    Comment: occasinally socially     History  Drug Use  . Types: Methamphetamines, Cocaine    Comment: Also Herion    Social History   Social History  . Marital status: Single    Spouse name: N/A  . Number of children: N/A  . Years of education: N/A   Social History Main Topics  .  Smoking status: Current Some Day Smoker    Packs/day: 1.00  . Smokeless tobacco: Never Used  . Alcohol use Yes     Comment: occasinally socially  . Drug use:     Types: Methamphetamines, Cocaine     Comment: Also Herion  . Sexual activity: Yes    Birth control/ protection: Injection   Other Topics Concern  . None   Social History Narrative  . None   Additional Social History:    Pain Medications: Heroin Prescriptions: Subutex/klonopin Over the Counter: none History of alcohol / drug use?: Yes Negative Consequences of Use: Financial, Personal relationships Withdrawal Symptoms: Seizures Name of Substance 1: Heroin 1 - Age of First Use: unknown 1 - Amount (size/oz): unknown 1 - Frequency: daily 1 - Duration: few years 1 - Last Use / Amount: prior to medical admission   Sleep: "I did not sleep well last night".  Appetite:  Fair  Current Medications: Current Facility-Administered Medications  Medication Dose Route Frequency Provider Last Rate Last Dose  . acetaminophen (TYLENOL) tablet 650 mg  650 mg Oral Q6H PRN Sanjuana Kava, NP   650 mg at 01/20/16 0646  . alum & mag hydroxide-simeth (MAALOX/MYLANTA) 200-200-20 MG/5ML suspension 30 mL  30 mL Oral Q4H PRN Sanjuana Kava, NP      . buprenorphine (SUBUTEX) sublingual tablet 8 mg  8 mg Sublingual BID Neysa Hotter, MD   8 mg at 01/21/16 0816  . chlordiazePOXIDE (LIBRIUM) capsule  25 mg  25 mg Oral Q6H PRN Sanjuana Kava, NP   25 mg at 01/21/16 0815  . citalopram (CELEXA) tablet 10 mg  10 mg Oral Daily Neysa Hotter, MD   10 mg at 01/21/16 0815  . clonazePAM (KLONOPIN) tablet 0.25 mg  0.25 mg Oral BID Neysa Hotter, MD   0.25 mg at 01/21/16 0819  . dicyclomine (BENTYL) tablet 20 mg  20 mg Oral Q6H PRN Neysa Hotter, MD   20 mg at 01/20/16 1210  . feeding supplement (ENSURE ENLIVE) (ENSURE ENLIVE) liquid 237 mL  237 mL Oral BID BM Rockey Situ Cobos, MD   237 mL at 01/21/16 1041  . gabapentin (NEURONTIN) capsule 300 mg  300 mg Oral TID  Sanjuana Kava, NP   300 mg at 01/21/16 1415  . hydrocortisone cream 0.5 %   Topical TID Sanjuana Kava, NP      . hydrOXYzine (ATARAX/VISTARIL) tablet 25 mg  25 mg Oral Q6H PRN Sanjuana Kava, NP   25 mg at 01/20/16 1210  . loperamide (IMODIUM) capsule 2-4 mg  2-4 mg Oral PRN Sanjuana Kava, NP      . magnesium hydroxide (MILK OF MAGNESIA) suspension 30 mL  30 mL Oral Daily PRN Sanjuana Kava, NP      . methocarbamol (ROBAXIN) tablet 500 mg  500 mg Oral Q8H PRN Neysa Hotter, MD      . multivitamin with minerals tablet 1 tablet  1 tablet Oral Daily Sanjuana Kava, NP   1 tablet at 01/21/16 0815  . naproxen (NAPROSYN) tablet 500 mg  500 mg Oral BID PRN Neysa Hotter, MD      . nicotine polacrilex (NICORETTE) gum 2 mg  2 mg Oral PRN Sanjuana Kava, NP      . ondansetron (ZOFRAN-ODT) disintegrating tablet 4 mg  4 mg Oral Q6H PRN Sanjuana Kava, NP      . thiamine (B-1) injection 100 mg  100 mg Intramuscular Once Sanjuana Kava, NP      . thiamine (VITAMIN B-1) tablet 100 mg  100 mg Oral Daily Sanjuana Kava, NP   100 mg at 01/21/16 0815  . traZODone (DESYREL) tablet 50 mg  50 mg Oral QHS PRN Neysa Hotter, MD   50 mg at 01/20/16 2135    Lab Results: No results found for this or any previous visit (from the past 48 hour(s)).  Blood Alcohol level:  Lab Results  Component Value Date   ETH <5 01/12/2016   [facility]  08/02/2010    <5        LOWEST DETECTABLE LIMIT FOR SERUM ALCOHOL IS 5 mg/dL FOR MEDICAL PURPOSES ONLY    Metabolic Disorder Labs: No results found for: HGBA1C, MPG No results found for: PROLACTIN No results found for: CHOL, TRIG, HDL, CHOLHDL, VLDL, LDLCALC  Physical Findings: AIMS: Facial and Oral Movements Muscles of Facial Expression: None, normal Lips and Perioral Area: None, normal Jaw: None, normal Tongue: None, normal,Extremity Movements Upper (arms, wrists, hands, fingers): None, normal Lower (legs, knees, ankles, toes): None, normal, Trunk Movements Neck, shoulders, hips: None,  normal, Overall Severity Severity of abnormal movements (highest score from questions above): None, normal Incapacitation due to abnormal movements: None, normal Patient's awareness of abnormal movements (rate only patient's report): No Awareness, Dental Status Current problems with teeth and/or dentures?: No Does patient usually wear dentures?: No  CIWA:  CIWA-Ar Total: 0 COWS:  COWS Total Score: 3  Musculoskeletal: Strength &  Muscle Tone: within normal limits Gait & Station: normal Patient leans: N/A  Psychiatric Specialty Exam: Physical Exam  ROS  Blood pressure 105/63, pulse 83, temperature 97.4 F (36.3 C), temperature source Oral, resp. rate 16, height  (1.6 m), weight 58.1 kg (128 lb), SpO2 99 %.Body mass index is 22.67 kg/m.   General Appearance: Casual  Eye Contact:  Good  Speech:  Normal Rate  Volume:  Normal  Mood:  Anxious  Affect:  Blunt  Thought Process:  Coherent inconsistent at times, concrete  Orientation:  Full (Time, Place, and Person)  Thought Content:  Logical, no paranoia. Perceptions: denies AH/VH  Suicidal Thoughts:  No   Homicidal Thoughts:  No  Memory:  NA  Judgement:  Fair  Insight:  Shallow  Psychomotor Activity:  Normal  Concentration:  Attention Span: Fair  Recall:  [employer] of Knowledge:  Fair  Language:  Fair  Akathisia:  No  Handed:  Ambidextrous  AIMS (if indicated):     Assets:  Desire for Improvement  ADL's:  Intact  Cognition:  Impaired,  Mild  Sleep:  Number of Hours: 5.5   Treatment Plan Summary: Daily contact with patient to assess and evaluate symptoms and progress in treatment and Medication management: 1. Continue crisis management, mood stabilization & relapse prevention.. 2. Continue current medication management to reduce current symptoms to base line and improve the  patient's overall level of functioning; Opioid dependence: Continue the Subutex 8 mg sublingually. Depression: Continue the Citalopram 10  mg. Severe anxiety: Continue the Klonopin 0.25 mg. Agitation: Continue the Gabapentin 300 mg. Insomnia: Increased Trazodone 100 mg 3. Treat health problems as indicated; continue Metformin 500 mg for DM, Lisinopril 10 mg for HTN 4. Develop treatment plan to enhance medication adeherance upon discharge & prevent the need for  readmission. 5. Psycho-social education regarding relapse prevention and self care. 6. Will continue PRN treatment regimen per protocols. 7.Monitor vital signs, review pertinent findings or order labs when necessary. 8. Social Worker to work on discharge disposition  Sanjuana Kava, NP, PMHNP, FNP-BC 01/21/2016, 4:07 PM  I agree with findings and treatment plan of this patient

## 2016-01-21 NOTE — Progress Notes (Signed)
D. Pt pleasant on approach, denies complaints at this time other than some continued withdrawal.  Pt observed up and interacting appropriately with peers in dayroom playing cards at present.  Pt denies SI/HI/hallucinations at this time.  Pleased to have new roommate, previous one had kept her awake.  A.  Support and encouragement offered, medication given as ordered for withdrawal.  R. Pt remains safe on the unit, will continue to monitor.

## 2016-01-21 NOTE — Progress Notes (Signed)
D: At the time of assessment, Pt endorsed moderate anxiety; states, "I feel I could be withdrawing; my anxiety is a little high right now." Pt denied depression, pain, SI, HI or AVH. Pt remained calm and cooperative. A: Medications offered as prescribed.  Support, encouragement, and safe environment provided.  15-minute safety checks continue. R: Pt was med compliant.  Patient attended AA group meeting. Safety checks continue.

## 2016-01-22 NOTE — Plan of Care (Signed)
Problem: Activity: Goal: Interest or engagement in activities will improve Outcome: Progressing Pt has attended group this evening

## 2016-01-22 NOTE — Progress Notes (Signed)
D. Pt pleasant on approach, looking forward to discharge to [facility] tomorrow.  Pt denies complaints at this time.  Pt was positive for evening AA group, observed interacting appropriately within milieu.  Pt denies SI/HI/hallucinations at this time.  A.  Support and encouragement offered  R.  PT remains safe on the unit, will continue to monitor.

## 2016-01-22 NOTE — [facility] Group Notes (Signed)
[facility] Group Notes:  (Clinical Social Work)  01/22/2016  10:00-11:00AM  Summary of Progress/Problems:   The main focus of today's process group was to   1)  Discuss the importance of adding supports  2)  Talk about the need for healthy supports to be able to pursue various healthy coping techniques, I.e. Need for a doctor if one is going to take medications for depression  3)  Identify the patient's current healthy supports and plan what to add.  An emphasis was placed on using counselor, doctor, therapy groups, 12-step groups, and problem-specific support groups to expand supports.    The patient expressed full comprehension of the concepts presented, and agreed that there is a need to add more supports.  The patient stated she needs to work on developing more healthy supports, and said her family and their appropriate boundaries are a healthy support right now.  She identified past people in her life as unhealthy.  She refrained from the side conversations that were prevalent yesterday.  Type of Therapy:  Process Group with Motivational Interviewing  Participation Level:  Active  Participation Quality:  Attentive and Sharing  Affect:  Blunted  Cognitive:  Appropriate and Oriented  Insight:  Developing/Improving  Engagement in Therapy:  Engaged  Modes of Intervention:   Education, Support and Processing, Activity  Ambrose MantleMareida Grossman-Orr, LCSW 01/22/2016

## 2016-01-22 NOTE — Progress Notes (Signed)
Patient attended AA group meeting.  

## 2016-01-22 NOTE — Progress Notes (Signed)
Patient ID: Savannah Palmer, female   DOB: 15-Jun-1991, 25 y.o.   MRN: [id number] [facility] MD Progress Note  01/22/2016 3:10 PM Savannah Palmer  MRN:  [id number]  Subjective:  [place]  Reports, "I Things are good. I'm happy. My information was faxed to the [facility] [place] treatment center & I got accepted. I'm doing great. I would like to be discharged in the morning to go to the [facility] tomorrow. They are coming to get pick me up from here".".  Objective: Patient is seen and chart reviewed. She is alert, oriented x 3. She is aware of situation. She says she did sleep well last night. She was moved to a new room due to Roommate problem. Says would like to to be discharged in the morning to go the [facility] [place] where she is accepted to continue substance abuse treatment. She is pleasant during assessments and appears to be engaged in treatment. Denies any suicidal thoughts. Patient is motivated to get better and has future goals.   Principal Problem: Polysubstance dependence including opioid drug with daily use (HCC)  Diagnosis:   Patient Active Problem List   Diagnosis Date Noted  . Polysubstance dependence including opioid drug with daily use (HCC) [F19.20] 01/19/2016  . Hepatitis C antibody test positive [R89.4] 01/17/2016  . AKI (acute kidney injury) (HCC) [N17.9] 01/13/2016  . IVDU (intravenous drug user) [F19.90] 01/13/2016  . Polysubstance (including opioids) dependence, daily use (HCC) [F19.20] 01/13/2016  . Murmur, heart [R01.1] 01/13/2016  . Altered mental status [R41.82]    Total Time spent with patient: 15 minutes  Past Psychiatric History: Opioid use disorder  Past Medical History:  Past Medical History:  Diagnosis Date  . Anxiety   . Anxiety   . Depression     Past Surgical History:  Procedure Laterality Date  . CHOLECYSTECTOMY     Family History: History reviewed. No pertinent family history.  Family Psychiatric  History: See H&P  Social  History:  History  Alcohol Use  . Yes    Comment: occasinally socially     History  Drug Use  . Types: Methamphetamines, Cocaine    Comment: Also Herion    Social History   Social History  . Marital status: Single    Spouse name: N/A  . Number of children: N/A  . Years of education: N/A   Social History Main Topics  . Smoking status: Current Some Day Smoker    Packs/day: 1.00  . Smokeless tobacco: Never Used  . Alcohol use Yes     Comment: occasinally socially  . Drug use:     Types: Methamphetamines, Cocaine     Comment: Also Herion  . Sexual activity: Yes    Birth control/ protection: Injection   Other Topics Concern  . None   Social History Narrative  . None   Additional Social History:    Pain Medications: Heroin Prescriptions: Subutex/klonopin Over the Counter: none History of alcohol / drug use?: Yes Negative Consequences of Use: Financial, Personal relationships Withdrawal Symptoms: Seizures Name of Substance 1: Heroin 1 - Age of First Use: unknown 1 - Amount (size/oz): unknown 1 - Frequency: daily 1 - Duration: few years 1 - Last Use / Amount: prior to medical admission   Sleep: "I did not sleep well last night".  Appetite:  Fair  Current Medications: Current Facility-Administered Medications  Medication Dose Route Frequency Provider Last Rate Last Dose  . acetaminophen (TYLENOL) tablet 650 mg  650 mg Oral Q6H PRN Sanjuana Kava, NP   650 mg at 01/20/16 0646  . alum & mag hydroxide-simeth (MAALOX/MYLANTA) 200-200-20 MG/5ML suspension 30 mL  30 mL Oral Q4H PRN Sanjuana Kava, NP      . buprenorphine (SUBUTEX) sublingual tablet 8 mg  8 mg Sublingual BID Neysa Hotter, MD   8 mg at 01/22/16 0820  . chlordiazePOXIDE (LIBRIUM) capsule 25 mg  25 mg Oral Q6H PRN Sanjuana Kava, NP   25 mg at 01/21/16 2115  . citalopram (CELEXA) tablet 10 mg  10 mg Oral Daily Neysa Hotter, MD   10 mg at 01/22/16 1610  . clonazePAM (KLONOPIN) tablet 0.25 mg  0.25 mg Oral BID  Neysa Hotter, MD   0.25 mg at 01/22/16 9604  . dicyclomine (BENTYL) tablet 20 mg  20 mg Oral Q6H PRN Neysa Hotter, MD   20 mg at 01/22/16 0820  . feeding supplement (ENSURE ENLIVE) (ENSURE ENLIVE) liquid 237 mL  237 mL Oral BID BM Rockey Situ Cobos, MD   237 mL at 01/22/16 1000  . gabapentin (NEURONTIN) capsule 300 mg  300 mg Oral TID Sanjuana Kava, NP   300 mg at 01/22/16 1142  . hydrocortisone cream 0.5 %   Topical TID Sanjuana Kava, NP   1 application at 01/22/16 1142  . hydrOXYzine (ATARAX/VISTARIL) tablet 25 mg  25 mg Oral Q6H PRN Sanjuana Kava, NP   25 mg at 01/21/16 2115  . loperamide (IMODIUM) capsule 2-4 mg  2-4 mg Oral PRN Sanjuana Kava, NP      . magnesium hydroxide (MILK OF MAGNESIA) suspension 30 mL  30 mL Oral Daily PRN Sanjuana Kava, NP      . methocarbamol (ROBAXIN) tablet 500 mg  500 mg Oral Q8H PRN Neysa Hotter, MD   500 mg at 01/21/16 2115  . multivitamin with minerals tablet 1 tablet  1 tablet Oral Daily Sanjuana Kava, NP   1 tablet at 01/22/16 0820  . naproxen (NAPROSYN) tablet 500 mg  500 mg Oral BID PRN Neysa Hotter, MD      . nicotine polacrilex (NICORETTE) gum 2 mg  2 mg Oral PRN Sanjuana Kava, NP   2 mg at 01/22/16 1141  . ondansetron (ZOFRAN-ODT) disintegrating tablet 4 mg  4 mg Oral Q6H PRN Sanjuana Kava, NP      . thiamine (B-1) injection 100 mg  100 mg Intramuscular Once Sanjuana Kava, NP      . thiamine (VITAMIN B-1) tablet 100 mg  100 mg Oral Daily Sanjuana Kava, NP   100 mg at 01/22/16 0824  . traZODone (DESYREL) tablet 100 mg  100 mg Oral QHS Sanjuana Kava, NP   100 mg at 01/21/16 2115   Lab Results: No results found for this or any previous visit (from the past 48 hour(s)).  Blood Alcohol level:  Lab Results  Component Value Date   ETH <5 01/12/2016   [facility]  08/02/2010    <5        LOWEST DETECTABLE LIMIT FOR SERUM ALCOHOL IS 5 mg/dL FOR MEDICAL PURPOSES ONLY   Metabolic Disorder Labs: No results found for: HGBA1C, MPG No results found for: PROLACTIN No  results found for: CHOL, TRIG, HDL, CHOLHDL, VLDL, LDLCALC  Physical Findings: AIMS: Facial and Oral Movements Muscles of Facial Expression: None, normal Lips and Perioral Area: None, normal Jaw: None, normal Tongue: None, normal,Extremity Movements Upper (arms, wrists, hands, fingers): None,  normal Lower (legs, knees, ankles, toes): None, normal, Trunk Movements Neck, shoulders, hips: None, normal, Overall Severity Severity of abnormal movements (highest score from questions above): None, normal Incapacitation due to abnormal movements: None, normal Patient's awareness of abnormal movements (rate only patient's report): No Awareness, Dental Status Current problems with teeth and/or dentures?: No Does patient usually wear dentures?: No  CIWA:  CIWA-Ar Total: 4 COWS:  COWS Total Score: 4  Musculoskeletal: Strength & Muscle Tone: within normal limits Gait & Station: normal Patient leans: N/A  Psychiatric Specialty Exam: Physical Exam  ROS  Blood pressure 106/64, pulse 92, temperature 97.4 F (36.3 C), temperature source Oral, resp. rate 16, height 5\' 3"  (1.6 m), weight 58.1 kg (128 lb), SpO2 99 %.Body mass index is 22.67 kg/m.   General Appearance: Casual  Eye Contact:  Good  Speech:  Normal Rate  Volume:  Normal  Mood:  Anxious  Affect:  Blunt  Thought Process:  Coherent inconsistent at times, concrete  Orientation:  Full (Time, Place, and Person)  Thought Content:  Logical, no paranoia. Perceptions: denies AH/VH  Suicidal Thoughts:  No   Homicidal Thoughts:  No  Memory:  NA  Judgement:  Fair  Insight:  Shallow  Psychomotor Activity:  Normal  Concentration:  Attention Span: Fair  Recall:  [employer]  Fund of Knowledge:  Fair  Language:  Fair  Akathisia:  No  Handed:  Ambidextrous  AIMS (if indicated):     Assets:  Desire for Improvement  ADL's:  Intact  Cognition:  Impaired,  Mild  Sleep:  Number of Hours: 5.5   Treatment Plan Summary: Daily contact with patient to  assess and evaluate symptoms and progress in treatment and Medication management: 1. Continue crisis management, mood stabilization & relapse prevention.. 2. Continue current medication management to reduce current symptoms to base line and improve the  patient's overall level of functioning; Opioid dependence: Continue the Subutex 8 mg sublingually. Depression: Continue the Citalopram 10 mg. Severe anxiety: Continue the Klonopin 0.25 mg. Agitation: Continue the Gabapentin 300 mg. Insomnia: Increased Trazodone 100 mg 3. Treat health problems as indicated; continue Metformin 500 mg for DM, Lisinopril 10 mg for HTN 4. Develop treatment plan to enhance medication adeherance upon discharge & prevent the need for  readmission. 5. Psycho-social education regarding relapse prevention and self care. 6. Will continue PRN treatment regimen per protocols. 7.Monitor vital signs, review pertinent findings or order labs when necessary. 8. Social Worker to work on discharge disposition. Patient has asked to be discharged in am to go to [facility] [place].  Sanjuana KavaNwoko, Agnes I, NP, PMHNP, FNP-BC 01/22/2016, 3:10 PM  I agree with findings and treatment plan of this patient

## 2016-01-22 NOTE — Progress Notes (Signed)
Adult Psychoeducational Group Note  Date:  01/21/2016 Time:  10:45 pm  Group Topic/Focus:  Wrap-Up Group:   The focus of this group is to help patients review their daily goal of treatment and discuss progress on daily workbooks.   Participation Level:  Active  Participation Quality:  Appropriate, Sharing and Supportive  Affect:  Appropriate  Cognitive:  Alert, Appropriate and Oriented  Insight: Appropriate  Engagement in Group:  Engaged  Modes of Intervention:  Discussion  Additional Comments:  Patient attended group and said her day was a 4.  Her goal for today was to find a Rehab facilitate that would accept her insurance and she did..  One of her coping skills for the day is to be social, and discuss her problem.   Bridgitt Raggio W Reika Callanan 01/22/2016, 12:50 AM

## 2016-01-22 NOTE — Progress Notes (Signed)
D: Pt A & O X 3.  Denies SI, HI and AVH when assessed. Reported a good night sleep with good appetite, normal mood and good concentration level. Rated depression 0/10, hopelessness 0/10 and anxiety 3/10. Stated "my klnonopin helped with my anxiety level even though it's lower than what I'm used to taking at home". Pt [facility] a [facility] and per pt she got accepted and "they are coming to get pick me up from here tomorrow in the morning, they want my medicines and paper work to be ready then, I'm finally going to get the help I need". A: Scheduled and PRN medications administered as prescribed. Emotional support and availability provided to pt. Pt's H & P [facility] this [facility] with pt's written consent, as [facility] by [facility] for [facility]. Q 15 minutes checks remains effective. R: Pt attended scheduled groups this shift. Compliant with medications as ordered. Remains safe on and off unit. POC continues.

## 2016-01-23 MED ORDER — TRAZODONE HCL 100 MG PO TABS: 100.0000 mg | ORAL_TABLET | Freq: Every day | ORAL | 0 refills | Status: DC

## 2016-01-23 MED ORDER — GABAPENTIN 300 MG PO CAPS: 300.0000 mg | ORAL_CAPSULE | Freq: Three times a day (TID) | ORAL | 0 refills | Status: DC

## 2016-01-23 MED ORDER — CLONAZEPAM 0.5 MG PO TABS
0.2500 mg | ORAL_TABLET | Freq: Every day | ORAL | 0 refills | Status: DC
Start: 2016-01-24 — End: 2016-12-30

## 2016-01-23 MED ORDER — CLONAZEPAM 0.5 MG PO TABS: 0.2500 mg | ORAL_TABLET | Freq: Every day | ORAL | Status: DC

## 2016-01-23 MED ORDER — CITALOPRAM HYDROBROMIDE 10 MG PO TABS: 10.0000 mg | ORAL_TABLET | Freq: Every day | ORAL | 0 refills | Status: DC

## 2016-01-23 MED ORDER — NICOTINE POLACRILEX 2 MG MT GUM: 2.0000 mg | CHEWING_GUM | OROMUCOSAL | 0 refills | Status: DC | PRN

## 2016-01-23 NOTE — BHH Suicide Risk Assessment (Signed)
[facility]   Principal Problem: Polysubstance dependence including opioid drug with daily use [facility](HCC) Discharge Diagnoses:  Patient Active Problem List   Diagnosis Date Noted  . Polysubstance dependence including opioid drug with daily use (HCC) [F19.20] 01/19/2016  . Hepatitis C antibody test positive [R89.4] 01/17/2016  . AKI (acute kidney injury) (HCC) [N17.9] 01/13/2016  . IVDU (intravenous drug user) [F19.90] 01/13/2016  . Polysubstance (including opioids) dependence, daily use (HCC) [F19.20] 01/13/2016  . Murmur, heart [R01.1] 01/13/2016  . Altered mental status [R41.82]     Total Time spent with patient: 30 minutes  Musculoskeletal: Strength & Muscle Tone: within normal limits Gait & Station: normal Patient leans: N/A  Psychiatric Specialty Exam: ROSno headache, no chest pain, no shortness of breath, no nausea, no vomiting , no rash  Blood pressure 113/67, pulse 72, temperature 98.5 F (36.9 C), temperature source Oral, resp. rate 16, height 5\' 3"  (1.6 m), weight 128 lb (58.1 kg), SpO2 99 %.Body mass index is 22.67 kg/m.  General Appearance: Well Groomed  Eye Contact::  Good  Speech:  Normal Rate409  Volume:  Normal  Mood:  denies depression, mood is described as " normal, OK"  Affect:  Appropriate and Full Range  Thought Process:  Linear  Orientation:  Full (Time, Place, and Person)  Thought Content:  no hallucinations, no delusions, not internally preoccupied   Suicidal Thoughts:  No denies any suicidal or self injurious ideations   Homicidal Thoughts:  No denies any homicidal or violent ideations   Memory:  recent and remote grossly intact   Judgement:  Other:  improved   Insight:  Good  Psychomotor Activity:  Normal  Concentration:  Good  Recall:  Good  Fund of Knowledge:Good  Language: Good  Akathisia:  No  Handed:  Right  AIMS (if indicated):     Assets:  Desire for Improvement Resilience  Sleep:  Number of Hours: 6   Cognition: WNL  ADL's:  Intact   Mental Status Per Nursing Assessment::   On Admission:  NA  Demographic Factors:  25 year old single female, no children,was living with father   Loss Factors: Had stopped taking her medications prior to admission   Historical Factors: History of depression, anxiety, history of polysubstance dependence , no history of suicide attempts   Risk Reduction Factors:   Living with another person, especially a relative, Positive social support and Positive coping skills or problem solving skills  Continued Clinical Symptoms:  At this time patient is alert, attentive, calm, no symptoms of WDL, no tremors, no diaphoresis, no restlessness, mood is "OK", denies depression, affect is appropriate, reactive, no thought disorder, no suicidal ideations, no self injurious ideations, no homicidal ideations, future oriented. No medication side effects. Of note , had no seizures, seizure like activity on unit .   Cognitive Features That Contribute To Risk:  No gross cognitive deficits noted upon discharge. Is alert , attentive, and oriented x 3     Suicide Risk:  Mild:  Suicidal ideation of limited frequency, intensity, duration, and specificity.  There are no identifiable plans, no associated intent, mild dysphoria and related symptoms, good self-control (both objective and subjective assessment), few other risk factors, and identifiable protective factors, including available and accessible social support.  Follow-up Information    [facility] .   Why:  Left message for appt. 8/4 Contact information: [address] [place], [place] [address] Phone: [phone] Fax:        [facility]/Medication Management Follow up on 01/27/2016.   Why:  Appt on this date at 11:00AM with Lindaann Pascal PA for hospital follow-up/medication management.  Contact information: [address] [place], [place] [address] Phone: [phone] Fax:  [phone]          Plan Of Care/Follow-up recommendations:  Activity:  as tolerated  Diet:  Regular  Tests:  NA Other:  See below  Patient is leaving unit in good spirits . Plans to go to [facility]- as discussed with patient and with CSW, who has been in communication with the program, she will be continued on Suboxone management there, as is her request. She is on a very low dose of Klonopin, will taper off - will decrease dose to 0.25 mgrs QDAY x 3 days, then D/C . Patient agrees, and is motivated in this treatment . Nehemiah Massed, MD 01/23/2016, 10:47 AM

## 2016-01-23 NOTE — Progress Notes (Signed)
  [facility] :  Will you be returning to the same living situation after discharge:  No. Pt accepted to [facility] for today.  At discharge, do you have transportation home?: Yes,  driver will pick up pt today for transport to facility (between 12pm-1pm) Do you have the ability to pay for your medications: Yes,  [employer] private insurance  Release of information consent forms completed and submitted to medical records by CSW.  Patient to Follow up at: Follow-up Information    [facility] .   Why:  Please call prior to discharging from [facility] to schedule appt (resume services for suboxone maintenance).  Contact information: [address] [place], [place] [address] Phone: [phone] Fax: [phone]       [facility]Medication Management Follow up on 01/27/2016.   Why:  Appt on this date at 11:00AM with Lindaann PascalScott Long PA for hospital follow-up/medication management. Please call to reschedule if you go to [facility] for [facility] treatment.  Contact information: [address] [place], [place] [address] Phone: [phone] Fax: [phone]       [facility] .   Why:  You have been accepted for treatment for today.  Contact information: [address] [place], [place] [address] Phone: [phone] Fax: [phone]          Next level of care provider has access to [facility] Link:no  Safety Planning and Suicide Prevention discussed: Yes,  SPE completed with pt; pt declined to consent to family contact. SPI pamphlet and Mobile Crisis information provided to pt; she was encouraged to share this information with her support network.   Have you used any form of tobacco in the last 30 days? (Cigarettes, Smokeless Tobacco, Cigars, and/or Pipes): Yes  Has patient been referred to the Quitline?: Patient refused referral  Patient has been referred for addiction treatment:  Yes  Smart, Tiare Rohlman LCSW 01/23/2016, 11:28 AM

## 2016-01-23 NOTE — BHH Suicide Risk Assessment (Signed)
BHH INPATIENT:  Family/Significant Other Suicide Prevention Education  Suicide Prevention Education:  Patient Refusal for Family/Significant Other Suicide Prevention Education: The patient Savannah Palmer has refused to provide written consent for family/significant other to be provided Family/Significant Other Suicide Prevention Education during admission and/or prior to discharge.  Physician notified.  SPE completed with pt, as pt refused to consent to family contact. SPI pamphlet provided to pt and pt was encouraged to share information with support network, ask questions, and talk about any concerns relating to SPE. Pt denies access to guns/firearms and verbalized understanding of information provided. Mobile Crisis information also provided to pt.   Smart, Kaydynce Pat LCSW 01/23/2016, 11:28 AM

## 2016-01-23 NOTE — Progress Notes (Signed)
D: Savannah Palmer denied SI/HI/AVH today. She requested nicotine gum for cravings. She was appropriate in the milieu. No behavioral issues noted.   A: Meds given as ordered, including PRN nicotine gum with moderate relief noted. Q15 safety checks maintained. Support/encouragement offered.  R: Pt remains free from harm and continues with treatment. Will continue to monitor for needs/safety.

## 2016-01-23 NOTE — Progress Notes (Addendum)
CSW spoke with Kara [facility]ma [facility]. Pt has been accepted for treatment and per Kara Meadmma can remain on Subutex with dosage and clinic information. However, she must not be prescribed klonopin per Kara MeadEmma. CSW spoke with [place] and she agreed to go off klonopin in order to be accepted for treatment today. Per Kara MeadEmma, driver will come between 16-1WR12-1pm today in order to pick up this pt and another pt accepted to this facility.  Trula SladeHeather Smart, MSW, LCSW Clinical Social Worker 01/23/2016 8:46 AM   CSW spoke with Aggie Cosierheresa and [place] in Admissions. Per Dr. Jama Flavorsobos Request, he wanted to place pt on 3 days taper from klonopin and requested that CSW call admission to make sure this was an acceptable plan. (per Burna MortimerWanda, this is acceptable). Pt is able to continue subutex maintenance with documentation of her dosage and where she plans to follow-up after discharge. CSW faxed this information to [facility] admissions. Aggie Cosierheresa in admissions is aware that [facility] prescription for subutex, which is why CSW faxed MAR/documentation.   Trula SladeHeather Smart, MSW, LCSW Clinical Social Worker 01/23/2016 11:18 AM

## 2016-01-23 NOTE — Progress Notes (Signed)
Recreation Therapy Notes  Date: 01/23/16 Time: 0930 Location: 300 Hall Group Room  Group Topic: Stress Management  Goal Area(s) Addresses:  Patient will verbalize importance of using healthy stress management.  Patient will identify positive emotions associated with healthy stress management.   Intervention: Stress Management  Activity :  Forest Visualization.  LRT introduced pt to the technique of guided imagery.  Patients were to follow along as LRT read script to engage in the activity.   Education:  Stress Management, Discharge Planning.   Clinical Observations/Feedback: Pt did not attend group.     Lauralynn Loeb, LRT/CTRS  

## 2016-01-23 NOTE — Plan of Care (Signed)
Problem: Activity: Goal: Interest or engagement in activities will improve Outcome: Progressing Pt has attended AA group this weekend

## 2016-01-23 NOTE — Discharge Summary (Signed)
Physician Discharge Summary Note  Patient:  Savannah Palmer is an 25 y.o., female MRN:  [id number] DOB:  Sep 23, 1990 Patient phone:  [phone] (home)  Patient address:   [address] [place] [address],  Total Time spent with patient: 30 minutes  Date of Admission:  01/19/2016 Date of Discharge: 01/23/2016  Reason for Admission: PER H&P-History of Present Illness: Savannah Palmer is a 25 year old female with history of polysubstance use, who was originally brought to ED by her family for altered mental status. Per chart review, "Patient is very inconsistent in history giving to the ED providers during her stay in the ED and story keeps changing." She was on Suboxone but has stopped taking it as she was unable to afford it due to insurance issues. 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 She was placed on seizure precautions with concern that AMS was due to seizure from withdrwal; however, no overt withdrawal symptoms from benzodiazepine or opiates at ED.   Suboxone was resumed at ED since 7/29 with plan to titrate off; was on clonazepam 0.25 mg BID as well. UDS positive for opiates and cocaine. [name] reports she feels depressed as she was not on Suboxone last night. She endorses rhinorrhea, muscle ache, yawning and is concerned that she might have a seizure again (informed of patient that opiate withdrwal does not cause seizure as benzodiazepine does.) . She sates that she missed to go to Suboxone clinic a week ago, as she was very concerned of her insurance. She reports history of using opiate since teenager. She had treatment for detox twice in [place], last in 2015. She denies alcohol use or other drug use (per note, she abused on cocaine before). She denies SI/HI. She denies AH/VH. Per consult note written by Savannah Palmer on 01/19/2016: "Patient mother is at bedside reported patient has done well and she was on treatment with Klonopin and Suboxone and worried she may go  back to drug of abuse if she cannot continue taking the same medication."    Principal Problem: Polysubstance dependence including opioid drug with daily use [facility]) Discharge Diagnoses: Patient Active Problem List   Diagnosis Date Noted  . Polysubstance dependence including opioid drug with daily use (HCC) [F19.20] 01/19/2016  . Hepatitis C antibody test positive [R89.4] 01/17/2016  . AKI (acute kidney injury) (HCC) [N17.9] 01/13/2016  . IVDU (intravenous drug user) [F19.90] 01/13/2016  . Polysubstance (including opioids) dependence, daily use (HCC) [F19.20] 01/13/2016  . Murmur, heart [R01.1] 01/13/2016  . Altered mental status [R41.82]     Past Psychiatric History: See Above  Past Medical History:  Past Medical History:  Diagnosis Date  . Anxiety   . Anxiety   . Depression     Past Surgical History:  Procedure Laterality Date  . CHOLECYSTECTOMY     Family History: History reviewed. No pertinent family history. Family Psychiatric  History: See H&P Social History:  History  Alcohol Use  . Yes    Comment: occasinally socially     History  Drug Use  . Types: Methamphetamines, Cocaine    Comment: Also Herion    Social History   Social History  . Marital status: Single    Spouse name: N/A  . Number of children: N/A  . Years of education: N/A   Social History Main Topics  . Smoking status: Current Some Day Smoker    Packs/day: 1.00  . Smokeless tobacco: Never Used  . Alcohol use Yes  Comment: occasinally socially  . Drug use:     Types: Methamphetamines, Cocaine     Comment: Also Herion  . Sexual activity: Yes    Birth control/ protection: Injection   Other Topics Concern  . None   Social History Narrative  . None    Hospital Course: Arville LimeVictoria L Vickers was admitted for Polysubstance dependence including opioid drug with daily use [facility](HCC)  and crisis management.  Pt was treated discharged with the medications listed below under Medication List.  Medical  problems were identified and treated as needed.  Home medications were restarted as appropriate.  Improvement was monitored by observation and Arville LimeVictoria L Seto 's daily report of symptom reduction.  Emotional and mental status was monitored by daily self-inventory reports completed by Arville LimeVictoria L Hyneman and clinical staff.         Arville LimeVictoria L Orama was evaluated by the treatment team for stability and plans for continued recovery upon discharge. Arville LimeVictoria L Paci 's motivation was an integral factor for scheduling further treatment. Employment, transportation, bed availability, health status, family support, and any pending legal issues were also considered during hospital stay. Pt was offered further treatment options upon discharge including but not limited to Residential, Intensive Outpatient, and Outpatient treatment.  Arville LimeVictoria L Feria will follow up with the services as listed below under Follow Up Information.     Upon completion of this admission the patient was both mentally and medically stable for discharge denying suicidal/homicidal ideation, auditory/visual/tactile hallucinations, delusional thoughts and paranoia.    TurkeyVictoria L Klaus responded well to treatment with Celexa, Neurontin, and trazodone without adverse effects. Pt demonstrated improvement without reported or observed adverse effects to the point of stability appropriate for outpatient management. Pertinent labs include: BMP for which outpatient follow-up is necessary for lab recheck as mentioned below. Reviewed CBC, CMP, BAL, and UDS; all unremarkable aside from noted exceptions.   Physical Findings: AIMS: Facial and Oral Movements Muscles of Facial Expression: None, normal Lips and Perioral Area: None, normal Jaw: None, normal Tongue: None, normal,Extremity Movements Upper (arms, wrists, hands, fingers): None, normal Lower (legs, knees, ankles, toes): None, normal, Trunk Movements Neck, shoulders, hips: None, normal, Overall  Severity Severity of abnormal movements (highest score from questions above): None, normal Incapacitation due to abnormal movements: None, normal Patient's awareness of abnormal movements (rate only patient's report): No Awareness, Dental Status Current problems with teeth and/or dentures?: No Does patient usually wear dentures?: No  CIWA:  CIWA-Ar Total: 2 COWS:  COWS Total Score: 1  Musculoskeletal: Strength & Muscle Tone: within normal limits Gait & Station: normal Patient leans: N/A  Psychiatric Specialty Exam: See SRa by MD Physical Exam  Nursing note and vitals reviewed. Constitutional: She is oriented to person, place, and time. She appears well-developed.  HENT:  Head: Normocephalic.  Neck: Normal range of motion.  Musculoskeletal: Normal range of motion.  Neurological: She is alert and oriented to person, place, and time.  Psychiatric: She has a normal mood and affect. Her behavior is normal.    Review of Systems  Psychiatric/Behavioral: Negative for depression (stable) and suicidal ideas. The patient is not nervous/anxious.   All other systems reviewed and are negative.   Blood pressure 113/67, pulse 72, temperature 98.5 F (36.9 C), temperature source Oral, resp. rate 16, height 5\' 3"  (1.6 m), weight 58.1 kg (128 lb), SpO2 99 %.Body mass index is 22.67 kg/m.    Have you used any form of tobacco in the last 30 days? (Cigarettes, Smokeless  Tobacco, Cigars, and/or Pipes): Yes  Has this patient used any form of tobacco in the last 30 days? (Cigarettes, Smokeless Tobacco, Cigars, and/or Pipes), Yes, A prescription for an FDA-approved tobacco cessation medication was offered at discharge and the patient refused  Blood Alcohol level:  Lab Results  Component Value Date   ETH <5 01/12/2016   [facility]  08/02/2010    <5        LOWEST DETECTABLE LIMIT FOR SERUM ALCOHOL IS 5 mg/dL FOR MEDICAL PURPOSES ONLY    Metabolic Disorder Labs:  No results found for: HGBA1C, MPG No  results found for: PROLACTIN No results found for: CHOL, TRIG, HDL, CHOLHDL, VLDL, LDLCALC  See Psychiatric Specialty Exam and Suicide Risk Assessment completed by Attending Physician prior to discharge.  Discharge destination:  Other:  [facility] treatment  Is patient on multiple antipsychotic therapies at discharge:  No   Has Patient had three or more failed trials of antipsychotic monotherapy by history:  No  Recommended Plan for Multiple Antipsychotic Therapies: NA  Discharge Instructions    Diet - low sodium heart healthy    Complete by:  As directed   Discharge instructions    Complete by:  As directed   Take all medications as prescribed. Keep all follow-up appointments as scheduled.  Do not consume alcohol or use illegal drugs while on prescription medications. Report any adverse effects from your medications to your primary care provider promptly.  In the event of recurrent symptoms or worsening symptoms, call 911, a crisis hotline, or go to the nearest emergency department for evaluation.       Medication List    STOP taking these medications   buprenorphine 8 MG Subl SL tablet Commonly known as:  SUBUTEX   folic acid 1 MG tablet Commonly known as:  FOLVITE   multivitamin with minerals Tabs tablet   thiamine 100 MG tablet     TAKE these medications     Indication  citalopram 10 MG tablet Commonly known as:  CELEXA Take 1 tablet (10 mg total) by mouth daily.  Indication:  Depression   clonazePAM 0.5 MG tablet Commonly known as:  KLONOPIN Take 0.5 tablets (0.25 mg total) by mouth daily. 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  Indication:  Agitation   nicotine polacrilex 2 MG gum Commonly known as:  NICORETTE Take 1 each (2 mg total) by mouth as needed for  smoking cessation.  Indication:  Nicotine Addiction   traZODone 100 MG tablet Commonly known as:  DESYREL Take 1 tablet (100 mg total) by mouth at bedtime.  Indication:  Trouble Sleeping      Follow-up Information    [facility] .   Why:  Please call prior to discharging from [facility] [place] to schedule appt (resume services for suboxone maintenance).  Contact information: [address] [place], [place] [address] Phone: [phone] Fax: [phone]       [facility]Medication Management Follow up on 01/27/2016.   Why:  Appt on this date at 11:00AM with Lindaann Pascal PA for hospital follow-up/medication management. Please call to reschedule if you go to [facility] [place] for inpatient treatment.  Contact information: [address] [place], [place] [address] Phone: [phone] Fax: [phone]  [facility] .   Why:  You have been accepted for treatment for today.  Contact information: [address] [place], [place] [address] Phone: [phone] Fax: [phone]          Follow-up recommendations:  Activity:  as tolerated Diet:  heart healthy  Comments:  Take all medications as prescribed. Keep all follow-up appointments as scheduled.  Do not consume alcohol or use illegal drugs while on prescription medications. Report any adverse effects from your medications to your primary care provider promptly.  In the event of recurrent symptoms or worsening symptoms, call 911, a crisis hotline, or go to the nearest emergency department for evaluation.   Signed: Oneta Rack, NP 01/23/2016, 2:11 PM   Patient seen, Suicide Assessment Completed.  Disposition Plan Reviewed

## 2016-01-23 NOTE — BHH Group Notes (Signed)
Pt attended spiritual care group on grief and loss facilitated by chaplain Jessia Kief   Group opened with brief discussion and psycho-social ed around grief and loss in relationships and in relation to self - identifying life patterns, circumstances, changes that cause losses. Established group norm of speaking from own life experience. Group goal of establishing open and affirming space for members to share loss and experience with grief, normalize grief experience and provide psycho social education and grief support.   Group facilitation drew on Narrative, Adlerian, and brief CBT frameworks.  

## 2016-01-23 NOTE — Progress Notes (Signed)
Patient ID: Savannah Palmer, female   DOB: 1990/08/19, 25 y.o.   MRN: [id number] Patient was discharged per order. AVS, medications, scripts, med samples, transition summary, and suicide safety plan were all reviewed with patient. Pt would not fill out suicide safety plan, which was documented in chart. Pt was given an opportunity to ask questions and verbalized understanding of all discharge paperwork. Belongings were returned, and patient signed for receipt. Patient verbalized readiness for discharge and appeared in no acute distress when escorted to lobby, where ride awaited to take her [facility] [place].

## 2016-01-23 NOTE — Progress Notes (Addendum)
Patient is prescribed 8mg  subutext BID and has been going to [facility] for sub[facility]ex maintenance. [address] [place], [place] [address] Phone: [phone]; Fax: [phone]. She plans to resume services at that agency after completion of inpatient treatment. Patient has been prescribed .25mg  Klonopin BID while at [facility]. Per Dr. Jama Flavorsobos, he will prescribe taper over the next 10 days to prevent seizure. Patient is discharging [facility]. CSW spoke with Burna MortimerWanda in admissions, who stated that this plan was acceptable. Discharge note will be faxed to facility.  Trula SladeHeather Smart, MSW, LCSW Clinical Social Worker 01/23/2016 11:24 AM

## 2016-01-23 NOTE — Tx Team (Signed)
Interdisciplinary Treatment Plan Update (Adult)  Date:  01/23/2016  Time Reviewed:  11:30 AM   Progress in Treatment: Attending groups: Yes Participating in groups:  Yes Taking medication as prescribed:  Yes. Tolerating medication:  Yes. Family/Significant othe contact made:  Pt denies SI/HI/AVH upon admission. Family contact not required. SPE completed with pt. Patient understands diagnosis:  Yes.AEB seeking treatment for polysubstance abuse, depression, seizure/withdrawals, and for medication stabilization.  Discussing patient identified problems/goals with staff:  Yes. Medical problems stabilized or resolved:  Yes. Denies suicidal/homicidal ideation: Yes. Issues/concerns per patient self-inventory:  Other:  Discharge Plan or Barriers: [facility] accepted [facility] for today. Pt plans to resume services at [facility] after going to [facility]. She also plans to resume services with her PCP at [facility].   Reason for Continuation of Hospitalization: Depression Medication stabilization Withdrawal symptoms  Comments:  Savannah Palmer is a 25 year old female being admitted voluntarily to [facility] from [facility] floor.  She was admitted on the med floor for altered mental status and possibly drug withdrawal seizures at home.  She reported that she ran out of her klonopin and her opiates at home.  She has long history of substance abuse and multiple IP treatments as well.  She is being treated with Suboxone in the community. She is diagnosed with Depressive disorder, Panic disorder and polysubstance dependence. She currently denies SI/HI or A/V hallucinations.   She reports feeling sad, anxious, hopelessness and sweating.  Estimated length of stay:  D/c today   Additional Comments:  Patient and CSW reviewed pt's identified goals and treatment plan. Patient verbalized understanding and agreed to treatment plan. CSW reviewed [facility] "Discharge Process and Patient  Involvement" Form. Pt verbalized understanding of information provided and signed form.    Review of initial/current patient goals per problem list:  1. Goal(s): Patient will participate in aftercare plan  Met: Yes  Target date: at discharge  As evidenced by: Patient will participate within aftercare plan AEB aftercare provider and housing plan at discharge being  identified.    8/4: CSW assessing for appropriate referrals.    8/7: Pt plans to go to [facility] today.   2. Goal (s): Patient will exhibit decreased depressive symptoms and suicidal ideations.  Met:Yes.    Target date: at discharge  As evidenced by: Patient will utilize self rating of depression at 3 or below and demonstrate decreased signs of depression or be deemed stable for discharge by MD.  8/4: Pt rates depression as high. Continues to deny SI/HI/AVH.   8/7: Pt rates depression as 1/10 and presents with pleasant mood/calm affect. Denies SI/HI/AVH.   4. Goal(s): Patient will demonstrate decreased signs of withdrawal due to substance abuse  Met: Yes  Target date:at discharge   As evidenced by: Patient will produce a CIWA/COWS score of 0, have stable vitals signs, and no symptoms of withdrawal.  8/4: Pt reports mild/moderate withdrawals with CIWA of 5/COWS of 4 and stable vitals.   8/7: Pt reports no signs of withdrawal with CIWA of 0/COWS of 0 today and stable vitals.   Attendees: Patient:   01/23/2016 11:30 AM   Family:   01/23/2016 11:30 AM   Physician:  Dr. Shea Evans; Dr. Parke Poisson MD 01/23/2016 11:30 AM   Nursing:   Kerby Nora RN, [facility] RN 01/23/2016 11:30 AM   Clinical Social Worker: Maxie Better, LCSW 01/23/2016 11:30 AM   Clinical Social Worker: Erasmo Downer Drinkard LCSW; Peri Maris  LCSW 01/23/2016 11:30 AM   Other:  01/23/2016 11:30 AM   Other:  Ricky Ala NP 01/23/2016 11:30 AM   Other:   01/23/2016 11:30 AM   Other:  01/23/2016 11:30 AM   Other:  01/23/2016 11:30 AM   Other:  01/23/2016 11:30 AM     01/23/2016 11:30 AM    01/23/2016 11:30 AM    01/23/2016 11:30 AM    01/23/2016 11:30 AM    Scribe for Treatment Team:   Maxie Better, LCSW 01/23/2016 11:30 AM

## 2016-02-21 MED FILL — ZUBSOLV 5.7-1.4 MG TAB SL: 5.7-1.4 | 7 days supply | Qty: 14 | Fill #0

## 2016-02-28 MED FILL — SUBOXONE 8 MG-2 MG SL FILM: 8-2 | 9 days supply | Qty: 18 | Fill #0

## 2016-03-12 ENCOUNTER — Inpatient Hospital Stay (HOSPITAL_COMMUNITY)
Admission: EM | Admit: 2016-03-12 | Discharge: 2016-03-16 | DRG: 871 | Disposition: A | Discharge status: Home / Self Care | Payer: BLUE CROSS/BLUE SHIELD | Attending: Internal Medicine | Admitting: Internal Medicine

## 2016-03-12 ENCOUNTER — Encounter (HOSPITAL_COMMUNITY): Payer: Self-pay | Admitting: Emergency Medicine

## 2016-03-12 ENCOUNTER — Emergency Department (HOSPITAL_COMMUNITY): Payer: BLUE CROSS/BLUE SHIELD

## 2016-03-12 DIAGNOSIS — F1920 Other psychoactive substance dependence, uncomplicated: Secondary | ICD-10-CM | POA: Diagnosis present

## 2016-03-12 DIAGNOSIS — E876 Hypokalemia: Secondary | ICD-10-CM

## 2016-03-12 DIAGNOSIS — I76 Septic arterial embolism: Secondary | ICD-10-CM | POA: Diagnosis present

## 2016-03-12 DIAGNOSIS — L0390 Cellulitis, unspecified: Secondary | ICD-10-CM

## 2016-03-12 DIAGNOSIS — L0291 Cutaneous abscess, unspecified: Secondary | ICD-10-CM

## 2016-03-12 DIAGNOSIS — N390 Urinary tract infection, site not specified: Secondary | ICD-10-CM | POA: Diagnosis present

## 2016-03-12 DIAGNOSIS — L03114 Cellulitis of left upper limb: Secondary | ICD-10-CM | POA: Diagnosis present

## 2016-03-12 DIAGNOSIS — R768 Other specified abnormal immunological findings in serum: Secondary | ICD-10-CM | POA: Diagnosis present

## 2016-03-12 DIAGNOSIS — I2690 Septic pulmonary embolism without acute cor pulmonale: Secondary | ICD-10-CM | POA: Diagnosis present

## 2016-03-12 DIAGNOSIS — L02414 Cutaneous abscess of left upper limb: Secondary | ICD-10-CM | POA: Diagnosis present

## 2016-03-12 DIAGNOSIS — F1120 Opioid dependence, uncomplicated: Secondary | ICD-10-CM

## 2016-03-12 DIAGNOSIS — J851 Abscess of lung with pneumonia: Secondary | ICD-10-CM | POA: Diagnosis present

## 2016-03-12 DIAGNOSIS — F419 Anxiety disorder, unspecified: Secondary | ICD-10-CM | POA: Diagnosis present

## 2016-03-12 DIAGNOSIS — F1990 Other psychoactive substance use, unspecified, uncomplicated: Secondary | ICD-10-CM

## 2016-03-12 DIAGNOSIS — Z22322 Carrier or suspected carrier of Methicillin resistant Staphylococcus aureus: Secondary | ICD-10-CM

## 2016-03-12 DIAGNOSIS — J852 Abscess of lung without pneumonia: Secondary | ICD-10-CM

## 2016-03-12 DIAGNOSIS — Z79899 Other long term (current) drug therapy: Secondary | ICD-10-CM

## 2016-03-12 DIAGNOSIS — Z9049 Acquired absence of other specified parts of digestive tract: Secondary | ICD-10-CM

## 2016-03-12 DIAGNOSIS — R011 Cardiac murmur, unspecified: Secondary | ICD-10-CM | POA: Diagnosis present

## 2016-03-12 DIAGNOSIS — F17200 Nicotine dependence, unspecified, uncomplicated: Secondary | ICD-10-CM | POA: Diagnosis present

## 2016-03-12 DIAGNOSIS — A419 Sepsis, unspecified organism: Principal | ICD-10-CM | POA: Diagnosis present

## 2016-03-12 DIAGNOSIS — D649 Anemia, unspecified: Secondary | ICD-10-CM | POA: Diagnosis present

## 2016-03-12 DIAGNOSIS — B9620 Unspecified Escherichia coli [E. coli] as the cause of diseases classified elsewhere: Secondary | ICD-10-CM | POA: Diagnosis present

## 2016-03-12 DIAGNOSIS — F329 Major depressive disorder, single episode, unspecified: Secondary | ICD-10-CM | POA: Diagnosis present

## 2016-03-12 LAB — URINALYSIS, ROUTINE W REFLEX MICROSCOPIC
Bilirubin Urine: NEGATIVE
Glucose, UA: NEGATIVE mg/dL
Ketones, ur: NEGATIVE mg/dL
NITRITE: POSITIVE — AB
PH: 7 (ref 5.0–8.0)
Protein, ur: 30 mg/dL — AB
SPECIFIC GRAVITY, URINE: 1.01 (ref 1.005–1.030)

## 2016-03-12 LAB — I-STAT CHEM 8, ED
BUN: <3 mg/dL — ABNORMAL LOW (ref 6–20)
CALCIUM ION: 0.93 mmol/L — AB (ref 1.15–1.40)
Chloride: 91 mmol/L — ABNORMAL LOW (ref 101–111)
Creatinine, Ser: 0.7 mg/dL (ref 0.44–1.00)
GLUCOSE: 117 mg/dL — AB (ref 65–99)
HCT: 32 % — ABNORMAL LOW (ref 36.0–46.0)
HEMOGLOBIN: 10.9 g/dL — AB (ref 12.0–15.0)
Potassium: 2.9 mmol/L — ABNORMAL LOW (ref 3.5–5.1)
Sodium: 129 mmol/L — ABNORMAL LOW (ref 135–145)
TCO2: 31 mmol/L (ref 0–100)

## 2016-03-12 LAB — CBC WITH DIFFERENTIAL/PLATELET
BASOS PCT: 0 %
Basophils Absolute: 0 10*3/uL (ref 0.0–0.1)
EOS PCT: 0 %
Eosinophils Absolute: 0 10*3/uL (ref 0.0–0.7)
HCT: 30.3 % — ABNORMAL LOW (ref 36.0–46.0)
Hemoglobin: 11 g/dL — ABNORMAL LOW (ref 12.0–15.0)
Lymphocytes Relative: 11 %
Lymphs Abs: 2.9 10*3/uL (ref 0.7–4.0)
MCH: 29.2 pg (ref 26.0–34.0)
MCHC: 36.3 g/dL — AB (ref 30.0–36.0)
MCV: 80.4 fL (ref 78.0–100.0)
MONO ABS: 1.9 10*3/uL — AB (ref 0.1–1.0)
Monocytes Relative: 7 %
NEUTROS ABS: 21.7 10*3/uL — AB (ref 1.7–7.7)
NEUTROS PCT: 82 %
PLATELETS: 316 10*3/uL (ref 150–400)
RBC: 3.77 MIL/uL — ABNORMAL LOW (ref 3.87–5.11)
RDW: 12.9 % (ref 11.5–15.5)
WBC: 26.5 10*3/uL — ABNORMAL HIGH (ref 4.0–10.5)

## 2016-03-12 LAB — RAPID URINE DRUG SCREEN, HOSP PERFORMED
AMPHETAMINES: NOT DETECTED
BARBITURATES: NOT DETECTED
BENZODIAZEPINES: NOT DETECTED
Cocaine: POSITIVE — AB
Opiates: POSITIVE — AB
Tetrahydrocannabinol: NOT DETECTED

## 2016-03-12 LAB — BASIC METABOLIC PANEL
Anion gap: 12 (ref 5–15)
CHLORIDE: 92 mmol/L — AB (ref 101–111)
CO2: 25 mmol/L (ref 22–32)
CREATININE: 0.63 mg/dL (ref 0.44–1.00)
Calcium: 8.1 mg/dL — ABNORMAL LOW (ref 8.9–10.3)
GFR calc Af Amer: >60 mL/min (ref >60)
GFR calc non Af Amer: >60 mL/min (ref >60)
GLUCOSE: 118 mg/dL — AB (ref 65–99)
Potassium: 2.6 mmol/L — CL (ref 3.5–5.1)
Sodium: 129 mmol/L — ABNORMAL LOW (ref 135–145)

## 2016-03-12 LAB — URINE MICROSCOPIC-ADD ON

## 2016-03-12 LAB — I-STAT CG4 LACTIC ACID, ED
LACTIC ACID, VENOUS: 1 mmol/L (ref 0.5–1.9)
LACTIC ACID, VENOUS: 1.11 mmol/L (ref 0.5–1.9)
Lactic Acid, Venous: 1.22 mmol/L (ref 0.5–1.9)

## 2016-03-12 LAB — RAPID STREP SCREEN (MED CTR MEBANE ONLY): Streptococcus, Group A Screen (Direct): NEGATIVE

## 2016-03-12 LAB — MAGNESIUM: MAGNESIUM: 1.6 mg/dL — AB (ref 1.7–2.4)

## 2016-03-12 LAB — PREGNANCY, URINE: Preg Test, Ur: NEGATIVE

## 2016-03-12 LAB — PROCALCITONIN: Procalcitonin: 1.42 ng/mL

## 2016-03-12 LAB — D-DIMER, QUANTITATIVE: D-Dimer, Quant: 3.13 ug/mL-FEU — ABNORMAL HIGH (ref 0.00–0.50)

## 2016-03-12 MED ORDER — GABAPENTIN 300 MG PO CAPS
300.0000 mg | ORAL_CAPSULE | Freq: Three times a day (TID) | ORAL | Status: DC
  Administered 2016-03-12 – 2016-03-16 (×11): 300 mg via ORAL
  Filled 2016-03-12 (×11): qty 1

## 2016-03-12 MED ORDER — ONDANSETRON HCL 4 MG PO TABS: 4.0000 mg | ORAL_TABLET | Freq: Four times a day (QID) | ORAL | Status: DC | PRN

## 2016-03-12 MED ORDER — ACETAMINOPHEN 325 MG PO TABS
650.0000 mg | ORAL_TABLET | Freq: Once | ORAL | Status: AC
  Administered 2016-03-12: 650 mg via ORAL
  Filled 2016-03-12: qty 2

## 2016-03-12 MED ORDER — BUPRENORPHINE HCL 2 MG SL SUBL
16.0000 mg | SUBLINGUAL_TABLET | Freq: Every day | SUBLINGUAL | Status: DC
  Administered 2016-03-13 – 2016-03-16 (×5): 16 mg via SUBLINGUAL
  Filled 2016-03-12: qty 8
  Filled 2016-03-12 (×3): qty 2
  Filled 2016-03-12: qty 8

## 2016-03-12 MED ORDER — PIPERACILLIN-TAZOBACTAM 3.375 G IVPB 30 MIN
3.3750 g | Freq: Once | INTRAVENOUS | Status: AC
  Administered 2016-03-12: 3.375 g via INTRAVENOUS
  Filled 2016-03-12: qty 50

## 2016-03-12 MED ORDER — ONDANSETRON HCL 4 MG/2ML IJ SOLN: 4.0000 mg | Freq: Four times a day (QID) | INTRAMUSCULAR | Status: DC | PRN

## 2016-03-12 MED ORDER — SODIUM CHLORIDE 0.9 % IV BOLUS (SEPSIS)
1000.0000 mL | Freq: Once | INTRAVENOUS | Status: AC
  Administered 2016-03-12: 1000 mL via INTRAVENOUS

## 2016-03-12 MED ORDER — PIPERACILLIN-TAZOBACTAM 3.375 G IVPB
3.3750 g | Freq: Three times a day (TID) | INTRAVENOUS | Status: DC
  Administered 2016-03-12 – 2016-03-15 (×8): 3.375 g via INTRAVENOUS
  Filled 2016-03-12 (×8): qty 50

## 2016-03-12 MED ORDER — SODIUM CHLORIDE 0.9 % IV SOLN
INTRAVENOUS | Status: AC
  Administered 2016-03-12: 23:00:00 via INTRAVENOUS

## 2016-03-12 MED ORDER — POTASSIUM CHLORIDE 10 MEQ/100ML IV SOLN
10.0000 meq | Freq: Once | INTRAVENOUS | Status: DC
  Filled 2016-03-12: qty 100

## 2016-03-12 MED ORDER — SODIUM CHLORIDE 0.9 % IV BOLUS (SEPSIS)
250.0000 mL | Freq: Once | INTRAVENOUS | Status: AC
  Administered 2016-03-12: 250 mL via INTRAVENOUS

## 2016-03-12 MED ORDER — CITALOPRAM HYDROBROMIDE 20 MG PO TABS
10.0000 mg | ORAL_TABLET | Freq: Every day | ORAL | Status: DC
  Administered 2016-03-12 – 2016-03-16 (×5): 10 mg via ORAL
  Filled 2016-03-12 (×5): qty 1

## 2016-03-12 MED ORDER — ACETAMINOPHEN 325 MG PO TABS
650.0000 mg | ORAL_TABLET | Freq: Four times a day (QID) | ORAL | Status: DC | PRN
  Administered 2016-03-12 – 2016-03-14 (×3): 650 mg via ORAL
  Filled 2016-03-12 (×3): qty 2

## 2016-03-12 MED ORDER — BUSPIRONE HCL 5 MG PO TABS
10.0000 mg | ORAL_TABLET | Freq: Two times a day (BID) | ORAL | Status: DC
  Administered 2016-03-12 – 2016-03-16 (×8): 10 mg via ORAL
  Filled 2016-03-12: qty 2
  Filled 2016-03-12: qty 1
  Filled 2016-03-12: qty 2
  Filled 2016-03-12: qty 1
  Filled 2016-03-12: qty 2
  Filled 2016-03-12: qty 1
  Filled 2016-03-12: qty 2
  Filled 2016-03-12: qty 1

## 2016-03-12 MED ORDER — POTASSIUM CHLORIDE CRYS ER 20 MEQ PO TBCR
40.0000 meq | EXTENDED_RELEASE_TABLET | Freq: Once | ORAL | Status: AC
  Administered 2016-03-12: 40 meq via ORAL
  Filled 2016-03-12: qty 2

## 2016-03-12 MED ORDER — ENOXAPARIN SODIUM 40 MG/0.4ML ~~LOC~~ SOLN
40.0000 mg | Freq: Every day | SUBCUTANEOUS | Status: DC
  Administered 2016-03-12: 40 mg via SUBCUTANEOUS
  Filled 2016-03-12: qty 0.4

## 2016-03-12 MED ORDER — VANCOMYCIN HCL IN DEXTROSE 1-5 GM/200ML-% IV SOLN
1000.0000 mg | Freq: Once | INTRAVENOUS | Status: AC
  Administered 2016-03-12: 1000 mg via INTRAVENOUS
  Filled 2016-03-12: qty 200

## 2016-03-12 MED ORDER — POTASSIUM CHLORIDE 10 MEQ/100ML IV SOLN
10.0000 meq | Freq: Once | INTRAVENOUS | Status: AC
  Administered 2016-03-12: 10 meq via INTRAVENOUS

## 2016-03-12 MED ORDER — IOPAMIDOL (ISOVUE-370) INJECTION 76%
100.0000 mL | Freq: Once | INTRAVENOUS | Status: AC | PRN
  Administered 2016-03-12: 100 mL via INTRAVENOUS

## 2016-03-12 MED ORDER — ACETAMINOPHEN 650 MG RE SUPP: 650.0000 mg | Freq: Four times a day (QID) | RECTAL | Status: DC | PRN

## 2016-03-12 MED ORDER — VANCOMYCIN HCL IN DEXTROSE 750-5 MG/150ML-% IV SOLN
750.0000 mg | Freq: Three times a day (TID) | INTRAVENOUS | Status: DC
  Administered 2016-03-13 – 2016-03-14 (×6): 750 mg via INTRAVENOUS
  Filled 2016-03-12 (×8): qty 150

## 2016-03-12 MED ORDER — MAGNESIUM SULFATE 50 % IJ SOLN: 1.0000 g | Freq: Once | INTRAMUSCULAR | Status: DC

## 2016-03-12 MED ORDER — SODIUM CHLORIDE 0.9 % IV BOLUS (SEPSIS)
500.0000 mL | Freq: Once | INTRAVENOUS | Status: AC
  Administered 2016-03-12: 500 mL via INTRAVENOUS

## 2016-03-12 MED ORDER — CLONAZEPAM 0.5 MG PO TABS
0.5000 mg | ORAL_TABLET | Freq: Every day | ORAL | Status: DC
  Administered 2016-03-12 – 2016-03-16 (×5): 0.5 mg via ORAL
  Filled 2016-03-12 (×5): qty 1

## 2016-03-12 MED ORDER — MAGNESIUM SULFATE IN D5W 1-5 GM/100ML-% IV SOLN
1.0000 g | Freq: Once | INTRAVENOUS | Status: AC
  Administered 2016-03-12: 1 g via INTRAVENOUS
  Filled 2016-03-12: qty 100

## 2016-03-12 MED ORDER — BUPRENORPHINE HCL-NALOXONE HCL 8-2 MG SL FILM
2.5000 | ORAL_FILM | Freq: Every day | SUBLINGUAL | Status: DC
  Filled 2016-03-12: qty 3

## 2016-03-12 MED ORDER — POTASSIUM CHLORIDE CRYS ER 20 MEQ PO TBCR
60.0000 meq | EXTENDED_RELEASE_TABLET | Freq: Once | ORAL | Status: AC
  Administered 2016-03-12: 60 meq via ORAL
  Filled 2016-03-12: qty 3

## 2016-03-12 NOTE — Progress Notes (Signed)
Pharmacy Antibiotic Note  Savannah Palmer is a 25 y.o. female admitted on 03/12/2016 with sepsis.  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  She has abscess on left arm that has worsened over past 3 days (not at sight of injections her patient report.)  Pharmacy has been consulted for Vancomycin & Zosyn dosing. 03/12/2016:  Tm 101.18F  Tachycardic  WBC elevated at 26.5, ANC pending, LA wnl  CrCl > 1200ml/min  Plan: Vancomycin 1gm IV x1 in ED, then 750mg  IV q8h (goal VT 15-120mcg/ml) Check Vancomycin trough at steady state Zosyn 3.375gm IV every 8 hours. Follow-up micro data, labs, vitals.  Height: 5\' 3"  (160 cm) Weight: 120 lb (54.4 kg) IBW/kg (Calculated) : 52.4  Temp (24hrs), Avg:100.1 F (37.8 C), Min:98.7 F (37.1 C), Max:101.8 F (38.8 C)   Recent Labs Lab 03/12/16 1637 03/12/16 1652  WBC 26.5*  --   LATICACIDVEN  --  1.22    CrCl cannot be calculated (Patient's most recent lab result is older than the maximum 21 days allowed.).    No Known Allergies  Antimicrobials this admission: Zosyn 9/25 >>  Vanc 9/25 >>   Dose adjustments this admission:  Microbiology results: 9/25 BCx: sent 9/25 UCx: sent  9/25 Rapid Strep Screen: negative  Thank you for allowing pharmacy to be a part of this patient's care.  Elson ClanLilliston, Zaelyn Noack Michelle 03/12/2016 5:27 PM
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
## 2016-03-12 NOTE — ED Notes (Signed)
Pt second IV delayed due to pt being difficult stick. IV team consult placed. Other IV medications will be completed in order until second IV established. This RN and other RN both attempted.

## 2016-03-12 NOTE — ED Notes (Signed)
RN will draw blood and start an IV line

## 2016-03-12 NOTE — ED Notes (Signed)
Pt to imaging
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
## 2016-03-12 NOTE — ED Notes (Signed)
Patient transported to CT 

## 2016-03-12 NOTE — H&P (Signed)
History and Physical    Savannah Palmer:[id number] DOB: 01/30/1991 DOA: 03/12/2016  PCP: Default, Provider, MD  Patient coming from: Home.  Chief Complaint: Left elbow pain and swelling.  HPI: Savannah Palmer is a 25 y.o. female with history of heart murmur with echocardiogram done in July this year showed no evidence of any valvular involvement with history of IV drug abuse presents to the ER because of increasing pain and swelling in the left elbow. Patient admits to have taken IV drugs but has injected on the right hand. Patient's left elbow swelling has been present for last for 5 days with increasing difficulty to completely straighten it. In the ER patient was found to be febrile with temperatures around 101F, tachycardic. Labs showed leukocytosis. D-dimer was done which was elevated. CT angiogram of the chest and showed cavitary nodule in the right side of the long concerning for septic emboli versus cavitary pneumonia.  Patient states she has been having productive cough for last few days and has been having fever and chills. Denies any chest pain dizziness or loss of consciousness nausea vomiting diarrhea abdominal pain or any skin rash.   ED Course: Patient was given fluid bolus and started on empiric antibiotics after blood cultures were obtained.  Review of Systems: As per HPI, rest all negative.   Past Medical History:  Diagnosis Date  . Anxiety   . Anxiety   . Depression     Past Surgical History:  Procedure Laterality Date  . CHOLECYSTECTOMY       reports that she has been smoking.  She has been smoking about 1.00 pack per day. She has never used smokeless tobacco. She reports that she drinks alcohol. She reports that she uses drugs, including Methamphetamines and Cocaine.  No Known Allergies  Family History  Problem Relation Age of Onset  . Tuberculosis Neg Hx   . Diabetes Mellitus II Neg Hx     Prior to Admission medications   Medication Sig Start  Date End Date Taking? Authorizing Provider  busPIRone (BUSPAR) 10 MG tablet Take 10 mg by mouth 2 (two) times daily. 02/15/16  Yes Historical Provider, MD  citalopram (CELEXA) 10 MG tablet Take 1 tablet (10 mg total) by mouth daily. 01/23/16  Yes Oneta Rack, NP  clonazePAM (KLONOPIN) 0.5 MG tablet Take 0.5 tablets (0.25 mg total) by mouth daily. Patient taking differently: Take 0.5 mg by mouth daily.  01/24/16  Yes Oneta Rack, NP  gabapentin (NEURONTIN) 300 MG capsule Take 1 capsule (300 mg total) by mouth 3 (three) times daily. 01/23/16  Yes Oneta Rack, NP  SUBOXONE 8-2 MG FILM Take 2.5 each by mouth daily. 02/28/16  Yes Historical Provider, MD  nicotine polacrilex (NICORETTE) 2 MG gum Take 1 each (2 mg total) by mouth as needed for smoking cessation. Patient not taking: Reported on 03/12/2016 01/23/16   Oneta Rack, NP  traZODone (DESYREL) 100 MG tablet Take 1 tablet (100 mg total) by mouth at bedtime. Patient not taking: Reported on 03/12/2016 01/23/16   Oneta Rack, NP    Physical Exam: Vitals:   03/12/16 1651 03/12/16 1724 03/12/16 1909 03/12/16 2112  BP:  100/61 (!) 94/49 95/59  Pulse:  100 84 80  Resp:  19 16 15   Temp: 101.8 F (38.8 C) 99.8 F (37.7 C)    TempSrc: Rectal Oral    SpO2:  100% 100% 99%  Weight:      Height:  Constitutional: Not in distress. Vitals:   03/12/16 1651 03/12/16 1724 03/12/16 1909 03/12/16 2112  BP:  100/61 (!) 94/49 95/59  Pulse:  100 84 80  Resp:  19 16 15   Temp: 101.8 F (38.8 C) 99.8 F (37.7 C)    TempSrc: Rectal Oral    SpO2:  100% 100% 99%  Weight:      Height:       Eyes: Anicteric no pallor. ENMT: No discharge from the ears eyes nose or mouth. Neck: No mass felt. No neck rigidity. No JVD appreciated. Respiratory: No rhonchi or crepitations. Cardiovascular: S1 and S2 heard. Abdomen: Soft nontender bowel sounds present. No guarding or rigidity. Musculoskeletal: The left elbow is swollen but no definite abscess  palpable. Patient not able to extend the left elbow completely. Skin: Multiple skin bruises. Neurologic: Alert awake oriented to time place and person. Moves all extremities. Psychiatric: Appears normal.   Labs on Admission: I have personally reviewed following labs and imaging studies  CBC:  Recent Labs Lab 03/12/16 1637 03/12/16 1756  WBC 26.5*  --   NEUTROABS 21.7*  --   HGB 11.0* 10.9*  HCT 30.3* 32.0*  MCV 80.4  --   PLT 316  --    Basic Metabolic Panel:  Recent Labs Lab 03/12/16 1637 03/12/16 1756  NA 129* 129*  K 2.6* 2.9*  CL 92* 91*  CO2 25  --   GLUCOSE 118* 117*  BUN <5* <3*  CREATININE 0.63 0.70  CALCIUM 8.1*  --   MG 1.6*  --    GFR: Estimated Creatinine Clearance: 88.2 mL/min (by C-G formula based on SCr of 0.7 mg/dL). Liver Function Tests: No results for input(s): AST, ALT, ALKPHOS, BILITOT, PROT, ALBUMIN in the last 168 hours. No results for input(s): LIPASE, AMYLASE in the last 168 hours. No results for input(s): AMMONIA in the last 168 hours. Coagulation Profile: No results for input(s): INR, PROTIME in the last 168 hours. Cardiac Enzymes: No results for input(s): CKTOTAL, CKMB, CKMBINDEX, TROPONINI in the last 168 hours. BNP (last 3 results) No results for input(s): PROBNP in the last 8760 hours. HbA1C: No results for input(s): HGBA1C in the last 72 hours. CBG: No results for input(s): GLUCAP in the last 168 hours. Lipid Profile: No results for input(s): CHOL, HDL, LDLCALC, TRIG, CHOLHDL, LDLDIRECT in the last 72 hours. Thyroid Function Tests: No results for input(s): TSH, T4TOTAL, FREET4, T3FREE, THYROIDAB in the last 72 hours. Anemia Panel: No results for input(s): VITAMINB12, FOLATE, FERRITIN, TIBC, IRON, RETICCTPCT in the last 72 hours. Urine analysis:    Component Value Date/Time   COLORURINE YELLOW 03/12/2016 1555   APPEARANCEUR CLOUDY (A) 03/12/2016 1555   LABSPEC 1.010 03/12/2016 1555   PHURINE 7.0 03/12/2016 1555   GLUCOSEU  NEGATIVE 03/12/2016 1555   HGBUR MODERATE (A) 03/12/2016 1555   BILIRUBINUR NEGATIVE 03/12/2016 1555   KETONESUR NEGATIVE 03/12/2016 1555   PROTEINUR 30 (A) 03/12/2016 1555   UROBILINOGEN 0.2 08/05/2010 1821   NITRITE POSITIVE (A) 03/12/2016 1555   LEUKOCYTESUR MODERATE (A) 03/12/2016 1555   Sepsis Labs: @LABRCNTIP (procalcitonin:4,lacticidven:4) ) Recent Results (from the past 240 hour(s))  Rapid strep screen     Status: None   Collection Time: 03/12/16  3:39 PM  Result Value Ref Range Status   Streptococcus, Group A Screen (Direct) NEGATIVE NEGATIVE Final    Comment: (NOTE) A Rapid Antigen test may result negative if the antigen level in the sample is below the detection level of this test. The FDA  has not cleared this test as a stand-alone test therefore the rapid antigen negative result has reflexed to a Group A Strep culture.   Culture, blood (routine x 2)     Status: None (Preliminary result)   Collection Time: 03/12/16  4:37 PM  Result Value Ref Range Status   Specimen Description   Final    BLOOD RIGHT FOREARM Performed at [facility]    Special Requests BOTTLES DRAWN AEROBIC AND ANAEROBIC 5 CC EA  Final   Culture PENDING  Incomplete   Report Status PENDING  Incomplete     Radiological Exams on Admission: Dg Chest 2 View  Result Date: 03/12/2016 CLINICAL DATA:  Left elbow pain and swelling. EXAM: CHEST  2 VIEW COMPARISON:  01/12/2016 FINDINGS: The heart size and mediastinal contours are within normal limits. Both lungs are clear. The visualized skeletal structures are unremarkable. IMPRESSION: Normal chest x-ray. Electronically Signed   By: Rudie MeyerP.  Gallerani M.D.   On: 03/12/2016 16:16   Dg Elbow Complete Left  Result Date: 03/12/2016 CLINICAL DATA:  Left elbow abscess. EXAM: LEFT ELBOW - COMPLETE 3+ VIEW COMPARISON:  None. FINDINGS: There is no evidence of fracture, dislocation, or joint effusion. There is no evidence of arthropathy or other focal bone  abnormality. Soft tissues are unremarkable. IMPRESSION: Normal left elbow. Electronically Signed   By: Lupita RaiderJames  Green Jr, M.D.   On: 03/12/2016 16:16   Ct Angio Chest Pe W/cm &/or Wo Cm  Result Date: 03/12/2016 CLINICAL DATA:  Heroin use.  Elevated D-dimer. EXAM: CT ANGIOGRAPHY CHEST WITH CONTRAST TECHNIQUE: Multidetector CT imaging of the chest was performed using the standard protocol during bolus administration of intravenous contrast. Multiplanar CT image reconstructions and MIPs were obtained to evaluate the vascular anatomy. CONTRAST:  100 cc Isovue 370 COMPARISON:  03/12/2016 radiographs FINDINGS: Cardiovascular: No filling defect is identified in the pulmonary arterial tree to suggest pulmonary embolus. No acute aortic findings. Heart size normal. No pericardial effusion. Mediastinum/Nodes: Minimal anterior mediastinal density probably from thymic tissue. No overlying sternal abnormality. Left axillary node 1.1 cm in short axis image 23/4. Lungs/Pleura: 10 mm cavitary nodule in the right lower lobe, image 55/11. Upper Abdomen: Heterogeneous enhancement of the spleen, possibly due to the early phase of contrast. Musculoskeletal: Unremarkable Review of the MIP images confirms the above findings. IMPRESSION: 1. 10 mm cavitary nodule in the right lower lobe with somewhat thick indistinct walls. Septic embolus or small cavitary pneumonia favored. 2. Upper normal size left axillary lymph nodes. 3. No filling defect is identified in the pulmonary arterial tree to suggest pulmonary embolus. Electronically Signed   By: Gaylyn RongWalter  Liebkemann M.D.   On: 03/12/2016 18:43    EKG: Independently reviewed. Sinus tachycardia.  Assessment/Plan Principal Problem:   Septic embolism (HCC) Active Problems:   Murmur, heart   Polysubstance dependence including opioid drug with daily use (HCC)   Normocytic anemia   Cellulitis    1. Septic emboli - given the history of IV drug abuse patient's clinical symptoms are  highly concerning for septic emboli. Patient at this time has been placed on empiric antibiotics vancomycin and Zosyn for sepsis from unknown source. We will recheck 2-D echo for any valvular lesions. Follow blood cultures, urine cultures. 2. Cavitary lung lesion - concerning for pneumonia versus septic emboli. Likely septic emboli. However since patient's lesion does have cavitary lesion and patient does have risk for TB will check Quantiferron gold assay. Until then patient will be on airborne percussions. 3. Left elbow  swelling concerning for cellulitis - patient does have restriction of movement so I have ordered CT scan of the elbow with and without contrast to rule out abscess. 4. Hypokalemia - not clear of false causing hypokalemia. Replace and recheck. Check magnesium levels. 5. IV drug abuse - check HIV status and social work consult for counseling. Continue gabapentin and Suboxone. 6. Normocytic normochromic anemia - follow CBC.   DVT prophylaxis: Patient did receive 1 dose of Lovenox for DVT prophylaxis. Will hold off further dose until CT of the elbow does not show any abscess. Patient is presently placed on SCDs. Code Status: Full code.  Family Communication: Discussed with patient.  Disposition Plan: To be determined.  Consults called: None.  Admission status: Inpatient. Likely stay 3-4 days.    Eduard Clos MD [facility] Pager [phone].  If 7PM-7AM, please contact night-coverage [email or web address] Password [facility]  03/12/2016, 10:01 PM

## 2016-03-13 ENCOUNTER — Other Ambulatory Visit (HOSPITAL_COMMUNITY): Payer: Self-pay

## 2016-03-13 ENCOUNTER — Inpatient Hospital Stay (HOSPITAL_COMMUNITY): Payer: BLUE CROSS/BLUE SHIELD

## 2016-03-13 DIAGNOSIS — R011 Cardiac murmur, unspecified: Secondary | ICD-10-CM

## 2016-03-13 DIAGNOSIS — D649 Anemia, unspecified: Secondary | ICD-10-CM

## 2016-03-13 DIAGNOSIS — J852 Abscess of lung without pneumonia: Secondary | ICD-10-CM

## 2016-03-13 LAB — COMPREHENSIVE METABOLIC PANEL
ALBUMIN: 2.7 g/dL — AB (ref 3.5–5.0)
ALK PHOS: 115 U/L (ref 38–126)
ALT: 21 U/L (ref 14–54)
AST: 30 U/L (ref 15–41)
Anion gap: 8 (ref 5–15)
BILIRUBIN TOTAL: 0.6 mg/dL (ref 0.3–1.2)
CALCIUM: 7.6 mg/dL — AB (ref 8.9–10.3)
CO2: 24 mmol/L (ref 22–32)
CREATININE: 0.59 mg/dL (ref 0.44–1.00)
Chloride: 105 mmol/L (ref 101–111)
GFR calc Af Amer: >60 mL/min (ref >60)
GLUCOSE: 112 mg/dL — AB (ref 65–99)
Potassium: 3.3 mmol/L — ABNORMAL LOW (ref 3.5–5.1)
Sodium: 137 mmol/L (ref 135–145)
TOTAL PROTEIN: 6.2 g/dL — AB (ref 6.5–8.1)

## 2016-03-13 LAB — CBC WITH DIFFERENTIAL/PLATELET
BASOS ABS: 0 10*3/uL (ref 0.0–0.1)
BASOS PCT: 0 %
EOS ABS: 0 10*3/uL (ref 0.0–0.7)
Eosinophils Relative: 0 %
HCT: 28.7 % — ABNORMAL LOW (ref 36.0–46.0)
HEMOGLOBIN: 10.1 g/dL — AB (ref 12.0–15.0)
LYMPHS ABS: 3 10*3/uL (ref 0.7–4.0)
LYMPHS PCT: 14 %
MCH: 29.7 pg (ref 26.0–34.0)
MCHC: 35.2 g/dL (ref 30.0–36.0)
MCV: 84.4 fL (ref 78.0–100.0)
MONO ABS: 1.5 10*3/uL — AB (ref 0.1–1.0)
Monocytes Relative: 7 %
NEUTROS ABS: 17 10*3/uL — AB (ref 1.7–7.7)
Neutrophils Relative %: 79 %
Platelets: 299 10*3/uL (ref 150–400)
RBC: 3.4 MIL/uL — ABNORMAL LOW (ref 3.87–5.11)
RDW: 13.6 % (ref 11.5–15.5)
WBC: 21.5 10*3/uL — ABNORMAL HIGH (ref 4.0–10.5)

## 2016-03-13 LAB — LACTIC ACID, PLASMA: LACTIC ACID, VENOUS: 1.3 mmol/L (ref 0.5–1.9)

## 2016-03-13 LAB — SEDIMENTATION RATE: Sed Rate: 72 mm/hr — ABNORMAL HIGH (ref 0–22)

## 2016-03-13 LAB — MRSA PCR SCREENING: MRSA BY PCR: POSITIVE — AB

## 2016-03-13 MED ORDER — GADOBENATE DIMEGLUMINE 529 MG/ML IV SOLN
10.0000 mL | Freq: Once | INTRAVENOUS | Status: AC | PRN
  Administered 2016-03-13: 10 mL via INTRAVENOUS

## 2016-03-13 MED ORDER — POTASSIUM CHLORIDE CRYS ER 20 MEQ PO TBCR
40.0000 meq | EXTENDED_RELEASE_TABLET | Freq: Once | ORAL | Status: AC
  Administered 2016-03-13: 40 meq via ORAL

## 2016-03-13 MED ORDER — POTASSIUM CHLORIDE CRYS ER 20 MEQ PO TBCR
40.0000 meq | EXTENDED_RELEASE_TABLET | Freq: Once | ORAL | Status: DC
  Filled 2016-03-13: qty 2

## 2016-03-13 MED ORDER — ENOXAPARIN SODIUM 40 MG/0.4ML ~~LOC~~ SOLN
40.0000 mg | SUBCUTANEOUS | Status: DC
  Administered 2016-03-13 – 2016-03-15 (×3): 40 mg via SUBCUTANEOUS
  Filled 2016-03-13 (×3): qty 0.4

## 2016-03-13 NOTE — Progress Notes (Signed)
MRI reviewed.  Recommend consult to interventional radiology for percutaneous ultrasound guided drainage and culture.  Continue empiric abx.  If no improvement from IR drainage and abx then could consider formal I&D in the OR.  Ortho available as needed.  Savannah ReelN. Michael Jessicca Stitzer, MD [facility] [phone] 4:52 PM

## 2016-03-13 NOTE — Consult Note (Signed)
ORTHOPAEDIC CONSULTATION  REQUESTING PHYSICIAN: Calvert CantorSaima Rizwan, MD  Chief Complaint: Bilateral elbow pain  HPI: Savannah Palmer is a 25 y.o. female who presents with sepsis and left elbow cellulitis, pain, swelling.  Patient has h/o IVDA with heroin.  Usually on suboxone but relapsed.  Admits to injecting into right hand.  Has had fever and increasing pain in left elbow that's worse with ROM.  Currently has pneumonia.  Has been on broad spectrum abx.  Ortho consulted to evaluate elbow pain.  Past Medical History:  Diagnosis Date  . Anxiety   . Anxiety   . Depression    Past Surgical History:  Procedure Laterality Date  . CHOLECYSTECTOMY     Social History   Social History  . Marital status: Single    Spouse name: N/A  . Number of children: N/A  . Years of education: N/A   Social History Main Topics  . Smoking status: Current Some Day Smoker    Packs/day: 1.00  . Smokeless tobacco: Never Used  . Alcohol use Yes     Comment: occasinally socially  . Drug use:     Types: Methamphetamines, Cocaine     Comment: Also Herion  . Sexual activity: Yes    Birth control/ protection: Injection   Other Topics Concern  . None   Social History Narrative  . None   Family History  Problem Relation Age of Onset  . Tuberculosis Neg Hx   . Diabetes Mellitus II Neg Hx    - negative except otherwise stated in the family history section No Known Allergies Prior to Admission medications   Medication Sig Start Date End Date Taking? Authorizing Provider  busPIRone (BUSPAR) 10 MG tablet Take 10 mg by mouth 2 (two) times daily. 02/15/16  Yes Historical Provider, MD  citalopram (CELEXA) 10 MG tablet Take 1 tablet (10 mg total) by mouth daily. 01/23/16  Yes Oneta Rackanika N Lewis, NP  clonazePAM (KLONOPIN) 0.5 MG tablet Take 0.5 tablets (0.25 mg total) by mouth daily. Patient taking differently: Take 0.5 mg by mouth daily.  01/24/16  Yes Oneta Rackanika N Lewis, NP  gabapentin (NEURONTIN) 300 MG capsule Take  1 capsule (300 mg total) by mouth 3 (three) times daily. 01/23/16  Yes Oneta Rackanika N Lewis, NP  SUBOXONE 8-2 MG FILM Take 2.5 each by mouth daily. 02/28/16  Yes Historical Provider, MD  nicotine polacrilex (NICORETTE) 2 MG gum Take 1 each (2 mg total) by mouth as needed for smoking cessation. Patient not taking: Reported on 03/12/2016 01/23/16   Oneta Rackanika N Lewis, NP  traZODone (DESYREL) 100 MG tablet Take 1 tablet (100 mg total) by mouth at bedtime. Patient not taking: Reported on 03/12/2016 01/23/16   Oneta Rackanika N Lewis, NP   Dg Chest 2 View  Result Date: 03/12/2016 CLINICAL DATA:  Left elbow pain and swelling. EXAM: CHEST  2 VIEW COMPARISON:  01/12/2016 FINDINGS: The heart size and mediastinal contours are within normal limits. Both lungs are clear. The visualized skeletal structures are unremarkable. IMPRESSION: Normal chest x-ray. Electronically Signed   By: Rudie MeyerP.  Gallerani M.D.   On: 03/12/2016 16:16   Dg Elbow Complete Left  Result Date: 03/12/2016 CLINICAL DATA:  Left elbow abscess. EXAM: LEFT ELBOW - COMPLETE 3+ VIEW COMPARISON:  None. FINDINGS: There is no evidence of fracture, dislocation, or joint effusion. There is no evidence of arthropathy or other focal bone abnormality. Soft tissues are unremarkable. IMPRESSION: Normal left elbow. Electronically Signed   By: Lupita RaiderJames  Green Jr, M.D.  On: 03/12/2016 16:16   Ct Angio Chest Pe W/cm &/or Wo Cm  Result Date: 03/12/2016 CLINICAL DATA:  Heroin use.  Elevated D-dimer. EXAM: CT ANGIOGRAPHY CHEST WITH CONTRAST TECHNIQUE: Multidetector CT imaging of the chest was performed using the standard protocol during bolus administration of intravenous contrast. Multiplanar CT image reconstructions and MIPs were obtained to evaluate the vascular anatomy. CONTRAST:  100 cc Isovue 370 COMPARISON:  03/12/2016 radiographs FINDINGS: Cardiovascular: No filling defect is identified in the pulmonary arterial tree to suggest pulmonary embolus. No acute aortic findings. Heart size  normal. No pericardial effusion. Mediastinum/Nodes: Minimal anterior mediastinal density probably from thymic tissue. No overlying sternal abnormality. Left axillary node 1.1 cm in short axis image 23/4. Lungs/Pleura: 10 mm cavitary nodule in the right lower lobe, image 55/11. Upper Abdomen: Heterogeneous enhancement of the spleen, possibly due to the early phase of contrast. Musculoskeletal: Unremarkable Review of the MIP images confirms the above findings. IMPRESSION: 1. 10 mm cavitary nodule in the right lower lobe with somewhat thick indistinct walls. Septic embolus or small cavitary pneumonia favored. 2. Upper normal size left axillary lymph nodes. 3. No filling defect is identified in the pulmonary arterial tree to suggest pulmonary embolus. Electronically Signed   By: Gaylyn Rong M.D.   On: 03/12/2016 18:43   - pertinent xrays, CT, MRI studies were reviewed and independently interpreted  Positive ROS: All other systems have been reviewed and were otherwise negative with the exception of those mentioned in the HPI and as above.  Physical Exam: General: Alert, no acute distress Cardiovascular: No pedal edema Respiratory: No cyanosis, no use of accessory musculature GI: No organomegaly, abdomen is soft and non-tender Skin: No lesions in the area of chief complaint Neurologic: Sensation intact distally Psychiatric: Patient is competent for consent with normal mood and affect Lymphatic: No axillary or cervical lymphadenopathy  MUSCULOSKELETAL:  - right elbow exam is benign - no signs of infection, septic arthritis, only mild discomfort with extending elbow fully - left elbow is cellulitic and indurated and painful to palpation; ROM is adequate; infection appears to be just superficial; skin intact  Assessment: Left elbow cellulitis  Plan: - MRI pending to r/o abscess - agree with empiric abx - NPO  - will follow up with MRI results - right elbow not concerning for  infection  Thank you for the consult and the opportunity to see Savannah Palmer  N. Glee Arvin, MD [facility] [phone] 11:50 AM

## 2016-03-13 NOTE — Progress Notes (Addendum)
PROGRESS NOTE    Savannah Palmer  ZOX:[id number] DOB: 11-06-1990 DOA: 03/12/2016  PCP: Default, Provider, MD   Brief Narrative:  This is a 25 year old female with a history of heroin abuse who was on treatment with Suboxone but since it finished, and she could not get a new prescription yet, she went back to abusing heroin. She states she has been injecting heroin about twice a day for 1-2 weeks now. She presents with swelling of her left forearm near her elbow, cough and fevers for the past 2-3 days. She is found to have left arm cellulitis versus abscess and a 10 cm lung abscess. Temperature was noted to be 101.8.  Subjective: Pain in her left arm in area of swelling. She is unable to fully extend either of her arms at the elbow. He has not had swelling of her right arm but it is painful when she tries to extend it. She has a cough which is slightly congested. No chest pain. No nausea vomiting or diarrhea.  Assessment & Plan:   Principal Problem:   Lung abscess/sepsis with leukocytosis, tachycardia and fever -Possibly due to septic embolism-echo is pending-she does not report any vomiting which could've resulted in aspiration -Continue vancomycin and Zosyn for now which will cover both lung abscess and cellulitis - Note MRSA PCR is positive -TB is low on the differential but has been placed on precautions by infection control -Continue to follow in step down unit as she is a high risk for further septic emboli  Active Problems:   Cellulitis -Left arm-we'll obtain an MRI to assess for abscess. -Due to the fact that she can't extend either arm fully without pain, I have asked for an orthopedic eval to further assist in management - ADDENDUM- MRI shows abscess- Dr Roda ShuttersXu recommended IR guided drainage which I have requested   UTI - Follow up culture -? Secondary to bacteremia    Murmur, heart -Obtain a 2-D echo as mentioned above to assess for endocarditis    Hepatitis C antibody  test positive -Will need further outpatient follow-up and treatment if she is ever able to abstain from drug abuse    Polysubstance dependence including opioid drug with daily use  -Monitor for heroin withdrawal-she is not exhibiting any such symptoms as of yet -HIV checked recently in July was negative    Normocytic anemia -We'll request an anemia panel for the morning  Hypokalemia -Replace   DVT prophylaxis: Lovenox Code Status: Full code Family Communication:  Disposition Plan: Home when stable Consultants:    Procedures:    Antimicrobials:  Anti-infectives    Start     Dose/Rate Route Frequency Ordered Stop   03/13/16 0200  vancomycin (VANCOCIN) IVPB 750 mg/150 ml premix     750 mg 150 mL/hr over 60 Minutes Intravenous Every 8 hours 03/12/16 1744     03/13/16 0000  piperacillin-tazobactam (ZOSYN) IVPB 3.375 g     3.375 g 12.5 mL/hr over 240 Minutes Intravenous Every 8 hours 03/12/16 1744     03/12/16 1715  piperacillin-tazobactam (ZOSYN) IVPB 3.375 g     3.375 g 100 mL/hr over 30 Minutes Intravenous  Once 03/12/16 1706 03/12/16 1756   03/12/16 1715  vancomycin (VANCOCIN) IVPB 1000 mg/200 mL premix     1,000 mg 200 mL/hr over 60 Minutes Intravenous  Once 03/12/16 1706 03/12/16 1938       Objective: Vitals:   03/13/16 0300 03/13/16 0400 03/13/16 0700 03/13/16 0800  BP: (!) 90/49 (!) 100/52  95/69   Pulse: 81 (!) 50 88   Resp: 20 (!) 22 (!) 21   Temp:    98.9 F (37.2 C)  TempSrc:    Oral  SpO2: 98% 93% 99%   Weight:      Height:        Intake/Output Summary (Last 24 hours) at 03/13/16 1153 Last data filed at 03/13/16 0900  Gross per 24 hour  Intake          3676.67 ml  Output                2 ml  Net          3674.67 ml   Filed Weights   03/12/16 1213  Weight: 54.4 kg (120 lb)    Examination: General exam: Appears comfortable  HEENT: PERRLA, oral mucosa moist, no sclera icterus or thrush Respiratory system: Clear to auscultation. Respiratory  effort normal. Cardiovascular system: S1 & S2 heard, RRR.  No murmurs  Gastrointestinal system: Abdomen soft, non-tender, nondistended. Normal bowel sound. No organomegaly Central nervous system: Alert and oriented. No focal neurological deficits. Extremities: No cyanosis, clubbing or edema- erythema and edema of dorsal aspect of left arm near elbow extended to the forearm Skin: No rashes or ulcers Psychiatry:  Mood & affect appropriate.     Data Reviewed: I have personally reviewed following labs and imaging studies  CBC:  Recent Labs Lab 03/12/16 1637 03/12/16 1756 03/13/16 0330  WBC 26.5*  --  21.5*  NEUTROABS 21.7*  --  17.0*  HGB 11.0* 10.9* 10.1*  HCT 30.3* 32.0* 28.7*  MCV 80.4  --  84.4  PLT 316  --  299   Basic Metabolic Panel:  Recent Labs Lab 03/12/16 1637 03/12/16 1756 03/13/16 0330  NA 129* 129* 137  K 2.6* 2.9* 3.3*  CL 92* 91* 105  CO2 25  --  24  GLUCOSE 118* 117* 112*  BUN <5* <3* <5*  CREATININE 0.63 0.70 0.59  CALCIUM 8.1*  --  7.6*  MG 1.6*  --   --    GFR: Estimated Creatinine Clearance: 88.2 mL/min (by C-G formula based on SCr of 0.59 mg/dL). Liver Function Tests:  Recent Labs Lab 03/13/16 0330  AST 30  ALT 21  ALKPHOS 115  BILITOT 0.6  PROT 6.2*  ALBUMIN 2.7*   No results for input(s): LIPASE, AMYLASE in the last 168 hours. No results for input(s): AMMONIA in the last 168 hours. Coagulation Profile: No results for input(s): INR, PROTIME in the last 168 hours. Cardiac Enzymes: No results for input(s): CKTOTAL, CKMB, CKMBINDEX, TROPONINI in the last 168 hours. BNP (last 3 results) No results for input(s): PROBNP in the last 8760 hours. HbA1C: No results for input(s): HGBA1C in the last 72 hours. CBG: No results for input(s): GLUCAP in the last 168 hours. Lipid Profile: No results for input(s): CHOL, HDL, LDLCALC, TRIG, CHOLHDL, LDLDIRECT in the last 72 hours. Thyroid Function Tests: No results for input(s): TSH, T4TOTAL,  FREET4, T3FREE, THYROIDAB in the last 72 hours. Anemia Panel: No results for input(s): VITAMINB12, FOLATE, FERRITIN, TIBC, IRON, RETICCTPCT in the last 72 hours. Urine analysis:    Component Value Date/Time   COLORURINE YELLOW 03/12/2016 1555   APPEARANCEUR CLOUDY (A) 03/12/2016 1555   LABSPEC 1.010 03/12/2016 1555   PHURINE 7.0 03/12/2016 1555   GLUCOSEU NEGATIVE 03/12/2016 1555   HGBUR MODERATE (A) 03/12/2016 1555   BILIRUBINUR NEGATIVE 03/12/2016 1555   KETONESUR NEGATIVE 03/12/2016 1555   PROTEINUR  30 (A) 03/12/2016 1555   UROBILINOGEN 0.2 08/05/2010 1821   NITRITE POSITIVE (A) 03/12/2016 1555   LEUKOCYTESUR MODERATE (A) 03/12/2016 1555   Sepsis Labs: @LABRCNTIP (procalcitonin:4,lacticidven:4) ) Recent Results (from the past 240 hour(s))  Rapid strep screen     Status: None   Collection Time: 03/12/16  3:39 PM  Result Value Ref Range Status   Streptococcus, Group A Screen (Direct) NEGATIVE NEGATIVE Final    Comment: (NOTE) A Rapid Antigen test may result negative if the antigen level in the sample is below the detection level of this test. The FDA has not cleared this test as a stand-alone test therefore the rapid antigen negative result has reflexed to a Group A Strep culture.   Culture, blood (routine x 2)     Status: None (Preliminary result)   Collection Time: 03/12/16  4:37 PM  Result Value Ref Range Status   Specimen Description   Final    BLOOD RIGHT FOREARM Performed at [facility]    Special Requests BOTTLES DRAWN AEROBIC AND ANAEROBIC 5 CC EA  Final   Culture PENDING  Incomplete   Report Status PENDING  Incomplete  MRSA PCR Screening     Status: Abnormal   Collection Time: 03/12/16 10:35 PM  Result Value Ref Range Status   MRSA by PCR POSITIVE (A) NEGATIVE Final    Comment:        The GeneXpert MRSA Assay (FDA approved for NASAL specimens only), is one component of a comprehensive MRSA colonization surveillance program. It is not intended to  diagnose MRSA infection nor to guide or monitor treatment for MRSA infections. RESULT CALLED TO, READ BACK BY AND VERIFIED WITH: C.CORRIDON,RN 0041 03/13/16 W.SHEA          Radiology Studies: Dg Chest 2 View  Result Date: 03/12/2016 CLINICAL DATA:  Left elbow pain and swelling. EXAM: CHEST  2 VIEW COMPARISON:  01/12/2016 FINDINGS: The heart size and mediastinal contours are within normal limits. Both lungs are clear. The visualized skeletal structures are unremarkable. IMPRESSION: Normal chest x-ray. Electronically Signed   By: Rudie Meyer M.D.   On: 03/12/2016 16:16   Dg Elbow Complete Left  Result Date: 03/12/2016 CLINICAL DATA:  Left elbow abscess. EXAM: LEFT ELBOW - COMPLETE 3+ VIEW COMPARISON:  None. FINDINGS: There is no evidence of fracture, dislocation, or joint effusion. There is no evidence of arthropathy or other focal bone abnormality. Soft tissues are unremarkable. IMPRESSION: Normal left elbow. Electronically Signed   By: Lupita Raider, M.D.   On: 03/12/2016 16:16   Ct Angio Chest Pe W/cm &/or Wo Cm  Result Date: 03/12/2016 CLINICAL DATA:  Heroin use.  Elevated D-dimer. EXAM: CT ANGIOGRAPHY CHEST WITH CONTRAST TECHNIQUE: Multidetector CT imaging of the chest was performed using the standard protocol during bolus administration of intravenous contrast. Multiplanar CT image reconstructions and MIPs were obtained to evaluate the vascular anatomy. CONTRAST:  100 cc Isovue 370 COMPARISON:  03/12/2016 radiographs FINDINGS: Cardiovascular: No filling defect is identified in the pulmonary arterial tree to suggest pulmonary embolus. No acute aortic findings. Heart size normal. No pericardial effusion. Mediastinum/Nodes: Minimal anterior mediastinal density probably from thymic tissue. No overlying sternal abnormality. Left axillary node 1.1 cm in short axis image 23/4. Lungs/Pleura: 10 mm cavitary nodule in the right lower lobe, image 55/11. Upper Abdomen: Heterogeneous enhancement of  the spleen, possibly due to the early phase of contrast. Musculoskeletal: Unremarkable Review of the MIP images confirms the above findings. IMPRESSION: 1. 10 mm cavitary  nodule in the right lower lobe with somewhat thick indistinct walls. Septic embolus or small cavitary pneumonia favored. 2. Upper normal size left axillary lymph nodes. 3. No filling defect is identified in the pulmonary arterial tree to suggest pulmonary embolus. Electronically Signed   By: Gaylyn Rong M.D.   On: 03/12/2016 18:43      Scheduled Meds: . buprenorphine  16 mg Sublingual Daily  . busPIRone  10 mg Oral BID  . citalopram  10 mg Oral Daily  . clonazePAM  0.5 mg Oral Daily  . gabapentin  300 mg Oral TID  . piperacillin-tazobactam (ZOSYN)  IV  3.375 g Intravenous Q8H  . potassium chloride  40 mEq Oral Once  . vancomycin  750 mg Intravenous Q8H   Continuous Infusions: . sodium chloride 75 mL/hr at 03/13/16 0700     LOS: 1 day    Time spent in minutes: 35    Autrey Human, MD [facility] Pager: [email or web address] Password [facility] 03/13/2016, 11:53 AM

## 2016-03-13 NOTE — ED Provider Notes (Signed)
Medical screening examination/treatment/procedure(s) were conducted as a shared visit with non-physician practitioner(s) and myself.  I personally evaluated the patient during the encounter.   EKG Interpretation None       Prior note by Demetrios LollKenneth Leaphart, PA-C was a shared encounter and not just a supervised encounter. This is an addendum to that note.     Canary Brimhristopher J Tegeler, MD 03/13/16 1124

## 2016-03-13 NOTE — Care Management Note (Signed)
Case Management Note  Patient Details  Name: Savannah Palmer MRN: [id number] Date of Birth: December 16, 1990  Subjective/Objective:      sepsis              Action/Plan: home   Expected Discharge Date:                  Expected Discharge Plan:  Home/Self Care  In-House Referral:     Discharge planning Services     Post Acute Care Choice:    Choice offered to:     DME Arranged:    DME Agency:     HH Arranged:    HH Agency:     Status of Service:  In process, will continue to follow  If discussed at Long Length of Stay Meetings, dates discussed:    Additional Comment:   Date:  March 13, 2016 Chart reviewed for concurrent status and case management needs. Will continue to follow the patient for status change: Discharge Planning: following for needs Expected discharge date: [id number] Savannah Palmer, Savannah Palmer, Savannah Palmer, [place]   [phone]  Savannah Palmer, Savannah Lynn, Savannah Palmer 03/13/2016, 11:18 AM

## 2016-03-14 ENCOUNTER — Inpatient Hospital Stay (HOSPITAL_COMMUNITY): Payer: BLUE CROSS/BLUE SHIELD

## 2016-03-14 DIAGNOSIS — R894 Abnormal immunological findings in specimens from other organs, systems and tissues: Secondary | ICD-10-CM

## 2016-03-14 DIAGNOSIS — R7881 Bacteremia: Secondary | ICD-10-CM | POA: Diagnosis not present

## 2016-03-14 DIAGNOSIS — F1920 Other psychoactive substance dependence, uncomplicated: Secondary | ICD-10-CM

## 2016-03-14 DIAGNOSIS — I2690 Septic pulmonary embolism without acute cor pulmonale: Secondary | ICD-10-CM

## 2016-03-14 DIAGNOSIS — J851 Abscess of lung with pneumonia: Secondary | ICD-10-CM

## 2016-03-14 LAB — ECHOCARDIOGRAM COMPLETE
Height: 63 in
WEIGHTICAEL: 1920 [oz_av]

## 2016-03-14 LAB — URINE CULTURE: Culture: >=100000 — AB

## 2016-03-14 LAB — FERRITIN: FERRITIN: 237 ng/mL (ref 11–307)

## 2016-03-14 LAB — HIV ANTIBODY (ROUTINE TESTING W REFLEX): HIV Screen 4th Generation wRfx: NONREACTIVE

## 2016-03-14 LAB — RETICULOCYTES
RBC.: 3.14 MIL/uL — ABNORMAL LOW (ref 3.87–5.11)
Retic Count, Absolute: 53.4 10*3/uL (ref 19.0–186.0)
Retic Ct Pct: 1.7 % (ref 0.4–3.1)

## 2016-03-14 LAB — IRON AND TIBC
IRON: 34 ug/dL (ref 28–170)
Saturation Ratios: 17 % (ref 10.4–31.8)
TIBC: 203 ug/dL — AB (ref 250–450)
UIBC: 169 ug/dL

## 2016-03-14 LAB — FOLATE: FOLATE: 11.3 ng/mL (ref >5.9)

## 2016-03-14 LAB — VANCOMYCIN, TROUGH: Vancomycin Tr: 11 ug/mL — ABNORMAL LOW (ref 15–20)

## 2016-03-14 LAB — VITAMIN B12: Vitamin B-12: 890 pg/mL (ref 180–914)

## 2016-03-14 MED ORDER — CHLORHEXIDINE GLUCONATE CLOTH 2 % EX PADS
6.0000 | MEDICATED_PAD | Freq: Every day | CUTANEOUS | Status: DC
  Administered 2016-03-14 – 2016-03-16 (×3): 6 via TOPICAL

## 2016-03-14 MED ORDER — MUPIROCIN 2 % EX OINT
1.0000 "application " | TOPICAL_OINTMENT | Freq: Two times a day (BID) | CUTANEOUS | Status: DC
  Administered 2016-03-14 – 2016-03-16 (×5): 1 via NASAL
  Filled 2016-03-14 (×2): qty 22

## 2016-03-14 MED ORDER — VANCOMYCIN HCL IN DEXTROSE 1-5 GM/200ML-% IV SOLN
1000.0000 mg | Freq: Three times a day (TID) | INTRAVENOUS | Status: DC
  Administered 2016-03-15 – 2016-03-16 (×5): 1000 mg via INTRAVENOUS
  Filled 2016-03-14 (×6): qty 200

## 2016-03-14 NOTE — Progress Notes (Signed)
PROGRESS NOTE    Savannah Palmer  NFA:[id number] DOB: 20-Feb-1991 DOA: 03/12/2016  PCP: Default, Provider, MD   Brief Narrative:  This is a 25 year old female with a history of heroin abuse who was on treatment with Suboxone but since it finished, and she could not get a new prescription yet, she went back to abusing heroin. She states she has been injecting heroin about twice a day for 1-2 weeks now. She presents with swelling of her left forearm near her elbow, cough and fevers for the past 2-3 days. She is found to have left arm cellulitis versus abscess and a 10 cm lung abscess. Temperature was noted to be 101.8.  Subjective: Parts mild cough, nonproductive, denies any chest pain or shortness of breath , no nausea or vomiting .  Assessment & Plan:  sepsis with leukocytosis, tachycardia and fever  -Possibly due to cavitary pneumonia from septic embolism, as well left forearm abscess. - echo is pending -Continue vancomycin and Zosyn for now which will cover both lung abscess and cellulitis - Note MRSA PCR is positive - Blood cultures remains with no growth to date, continue to follow -TB is low on the differential but has been placed on precautions by infection control, discussed with ID Dr. Orvan Falconer, no need for airborne precaution of TB rule out, will discontinue precaution. -Continue to follow in step down unit as she is a high risk for further septic emboli    Left arm Cellulitis and abscess - MRI shows abscess- Dr Roda Shutters recommended IR guided drainage, discussed with IR, they reported there is no role for intervention , we'll discussed with orthopedic further management recommendation, will continue with IV vancomycin and Zosyn  UTI - Urine culture growing Escherichia coli, continue with Zosyn.  Murmur, heart -Obtain a 2-D echo as mentioned above to assess for endocarditis  Hepatitis C antibody test positive -Will need further outpatient follow-up and treatment if she is ever  able to abstain from drug abuse  Polysubstance dependence including opioid drug with daily use  -Monitor for heroin withdrawal-she is not exhibiting any such symptoms as of yet -HIV checked recently in July was negative    Normocytic anemia -We'll request an anemia panel for the morning  Hypokalemia -Replace   DVT prophylaxis: Lovenox Code Status: Full code Family Communication: None at bedside Disposition Plan: Home when stable, will be able to stimulate him next 24 hours Consultants:   Orthopedic Procedures:    Antimicrobials:  Anti-infectives    Start     Dose/Rate Route Frequency Ordered Stop   03/13/16 0200  vancomycin (VANCOCIN) IVPB 750 mg/150 ml premix     750 mg 150 mL/hr over 60 Minutes Intravenous Every 8 hours 03/12/16 1744     03/13/16 0000  piperacillin-tazobactam (ZOSYN) IVPB 3.375 g     3.375 g 12.5 mL/hr over 240 Minutes Intravenous Every 8 hours 03/12/16 1744     03/12/16 1715  piperacillin-tazobactam (ZOSYN) IVPB 3.375 g     3.375 g 100 mL/hr over 30 Minutes Intravenous  Once 03/12/16 1706 03/12/16 1756   03/12/16 1715  vancomycin (VANCOCIN) IVPB 1000 mg/200 mL premix     1,000 mg 200 mL/hr over 60 Minutes Intravenous  Once 03/12/16 1706 03/12/16 1938       Objective: Vitals:   03/14/16 0400 03/14/16 0500 03/14/16 0757 03/14/16 0905  BP:    102/75  Pulse: 65 66  75  Resp: (!) 21 19  19   Temp:   97.2 F (36.2 C)  TempSrc:   Oral   SpO2: 96% 96%  97%  Weight:      Height:        Intake/Output Summary (Last 24 hours) at 03/14/16 1034 Last data filed at 03/14/16 0981  Gross per 24 hour  Intake             1590 ml  Output                0 ml  Net             1590 ml   Filed Weights   03/12/16 1213  Weight: 54.4 kg (120 lb)    Examination: General exam: Appears comfortable  HEENT: PERRLA, oral mucosa moist, no sclera icterus or thrush Respiratory system: Clear to auscultation. Respiratory effort normal. Cardiovascular system: S1 &  S2 heard, RRR.  No murmurs  Gastrointestinal system: Abdomen soft, non-tender, nondistended. Normal bowel sound. No organomegaly Central nervous system: Alert and oriented. No focal neurological deficits. Extremities: No cyanosis, clubbing or edema- erythema and edema of dorsal aspect of left arm near elbow extended to the forearm Skin: No rashes or ulcers Psychiatry:  Mood & affect appropriate.     Data Reviewed: I have personally reviewed following labs and imaging studies  CBC:  Recent Labs Lab 03/12/16 1637 03/12/16 1756 03/13/16 0330  WBC 26.5*  --  21.5*  NEUTROABS 21.7*  --  17.0*  HGB 11.0* 10.9* 10.1*  HCT 30.3* 32.0* 28.7*  MCV 80.4  --  84.4  PLT 316  --  299   Basic Metabolic Panel:  Recent Labs Lab 03/12/16 1637 03/12/16 1756 03/13/16 0330  NA 129* 129* 137  K 2.6* 2.9* 3.3*  CL 92* 91* 105  CO2 25  --  24  GLUCOSE 118* 117* 112*  BUN <5* <3* <5*  CREATININE 0.63 0.70 0.59  CALCIUM 8.1*  --  7.6*  MG 1.6*  --   --    GFR: Estimated Creatinine Clearance: 88.2 mL/min (by C-G formula based on SCr of 0.59 mg/dL). Liver Function Tests:  Recent Labs Lab 03/13/16 0330  AST 30  ALT 21  ALKPHOS 115  BILITOT 0.6  PROT 6.2*  ALBUMIN 2.7*   No results for input(s): LIPASE, AMYLASE in the last 168 hours. No results for input(s): AMMONIA in the last 168 hours. Coagulation Profile: No results for input(s): INR, PROTIME in the last 168 hours. Cardiac Enzymes: No results for input(s): CKTOTAL, CKMB, CKMBINDEX, TROPONINI in the last 168 hours. BNP (last 3 results) No results for input(s): PROBNP in the last 8760 hours. HbA1C: No results for input(s): HGBA1C in the last 72 hours. CBG: No results for input(s): GLUCAP in the last 168 hours. Lipid Profile: No results for input(s): CHOL, HDL, LDLCALC, TRIG, CHOLHDL, LDLDIRECT in the last 72 hours. Thyroid Function Tests: No results for input(s): TSH, T4TOTAL, FREET4, T3FREE, THYROIDAB in the last 72  hours. Anemia Panel:  Recent Labs  03/14/16 0342  VITAMINB12 890  FOLATE 11.3  FERRITIN 237  TIBC 203*  IRON 34  RETICCTPCT 1.7   Urine analysis:    Component Value Date/Time   COLORURINE YELLOW 03/12/2016 1555   APPEARANCEUR CLOUDY (A) 03/12/2016 1555   LABSPEC 1.010 03/12/2016 1555   PHURINE 7.0 03/12/2016 1555   GLUCOSEU NEGATIVE 03/12/2016 1555   HGBUR MODERATE (A) 03/12/2016 1555   BILIRUBINUR NEGATIVE 03/12/2016 1555   KETONESUR NEGATIVE 03/12/2016 1555   PROTEINUR 30 (A) 03/12/2016 1555   UROBILINOGEN 0.2 08/05/2010 1821  NITRITE POSITIVE (A) 03/12/2016 1555   LEUKOCYTESUR MODERATE (A) 03/12/2016 1555   Sepsis Labs: @LABRCNTIP (procalcitonin:4,lacticidven:4) ) Recent Results (from the past 240 hour(s))  Rapid strep screen     Status: None   Collection Time: 03/12/16  3:39 PM  Result Value Ref Range Status   Streptococcus, Group A Screen (Direct) NEGATIVE NEGATIVE Final    Comment: (NOTE) A Rapid Antigen test may result negative if the antigen level in the sample is below the detection level of this test. The FDA has not cleared this test as a stand-alone test therefore the rapid antigen negative result has reflexed to a Group A Strep culture.   Culture, group A strep     Status: None (Preliminary result)   Collection Time: 03/12/16  3:39 PM  Result Value Ref Range Status   Specimen Description THROAT  Final   Special Requests NONE Reflexed from [id number]  Final   Culture   Final    TOO YOUNG TO READ Performed at [facility]    Report Status PENDING  Incomplete  Urine culture     Status: Abnormal   Collection Time: 03/12/16  3:59 PM  Result Value Ref Range Status   Specimen Description URINE, RANDOM  Final   Special Requests NONE  Final   Culture >=100,000 COLONIES/mL ESCHERICHIA COLI (A)  Final   Report Status 03/14/2016 FINAL  Final   Organism ID, Bacteria ESCHERICHIA COLI (A)  Final      Susceptibility   Escherichia coli - MIC*     AMPICILLIN >=32 RESISTANT Resistant     CEFAZOLIN 8 SENSITIVE Sensitive     CEFTRIAXONE <=1 SENSITIVE Sensitive     CIPROFLOXACIN 1 SENSITIVE Sensitive     GENTAMICIN <=1 SENSITIVE Sensitive     IMIPENEM <=0.25 SENSITIVE Sensitive     NITROFURANTOIN <=16 SENSITIVE Sensitive     TRIMETH/SULFA >=320 RESISTANT Resistant     AMPICILLIN/SULBACTAM >=32 RESISTANT Resistant     PIP/TAZO <=4 SENSITIVE Sensitive     Extended ESBL NEGATIVE Sensitive     * >=100,000 COLONIES/mL ESCHERICHIA COLI  Culture, blood (routine x 2)     Status: None (Preliminary result)   Collection Time: 03/12/16  4:37 PM  Result Value Ref Range Status   Specimen Description BLOOD RIGHT FOREARM  Final   Special Requests BOTTLES DRAWN AEROBIC AND ANAEROBIC 5 CC EA  Final   Culture   Final    NO GROWTH < 24 HOURS Performed at [facility]    Report Status PENDING  Incomplete  Culture, blood (routine x 2)     Status: None (Preliminary result)   Collection Time: 03/12/16  4:37 PM  Result Value Ref Range Status   Specimen Description BLOOD LEFT HAND  Final   Special Requests BOTTLES DRAWN AEROBIC AND ANAEROBIC 5 CC EA  Final   Culture   Final    NO GROWTH < 24 HOURS Performed at [facility]    Report Status PENDING  Incomplete  MRSA PCR Screening     Status: Abnormal   Collection Time: 03/12/16 10:35 PM  Result Value Ref Range Status   MRSA by PCR POSITIVE (A) NEGATIVE Final    Comment:        The GeneXpert MRSA Assay (FDA approved for NASAL specimens only), is one component of a comprehensive MRSA colonization surveillance program. It is not intended to diagnose MRSA infection nor to guide or monitor treatment for MRSA infections. RESULT CALLED TO, READ BACK BY AND  VERIFIED WITH: C.CORRIDON,RN [id number] 03/13/16 W.SHEA          Radiology Studies: Dg Chest 2 View  Result Date: 03/12/2016 CLINICAL DATA:  Left elbow pain and swelling. EXAM: CHEST  2 VIEW COMPARISON:  01/12/2016 FINDINGS: The  heart size and mediastinal contours are within normal limits. Both lungs are clear. The visualized skeletal structures are unremarkable. IMPRESSION: Normal chest x-ray. Electronically Signed   By: Rudie Meyer M.D.   On: 03/12/2016 16:16   Dg Elbow Complete Left  Result Date: 03/12/2016 CLINICAL DATA:  Left elbow abscess. EXAM: LEFT ELBOW - COMPLETE 3+ VIEW COMPARISON:  None. FINDINGS: There is no evidence of fracture, dislocation, or joint effusion. There is no evidence of arthropathy or other focal bone abnormality. Soft tissues are unremarkable. IMPRESSION: Normal left elbow. Electronically Signed   By: Lupita Raider, M.D.   On: 03/12/2016 16:16   Ct Angio Chest Pe W/cm &/or Wo Cm  Result Date: 03/12/2016 CLINICAL DATA:  Heroin use.  Elevated D-dimer. EXAM: CT ANGIOGRAPHY CHEST WITH CONTRAST TECHNIQUE: Multidetector CT imaging of the chest was performed using the standard protocol during bolus administration of intravenous contrast. Multiplanar CT image reconstructions and MIPs were obtained to evaluate the vascular anatomy. CONTRAST:  100 cc Isovue 370 COMPARISON:  03/12/2016 radiographs FINDINGS: Cardiovascular: No filling defect is identified in the pulmonary arterial tree to suggest pulmonary embolus. No acute aortic findings. Heart size normal. No pericardial effusion. Mediastinum/Nodes: Minimal anterior mediastinal density probably from thymic tissue. No overlying sternal abnormality. Left axillary node 1.1 cm in short axis image 23/4. Lungs/Pleura: 10 mm cavitary nodule in the right lower lobe, image 55/11. Upper Abdomen: Heterogeneous enhancement of the spleen, possibly due to the early phase of contrast. Musculoskeletal: Unremarkable Review of the MIP images confirms the above findings. IMPRESSION: 1. 10 mm cavitary nodule in the right lower lobe with somewhat thick indistinct walls. Septic embolus or small cavitary pneumonia favored. 2. Upper normal size left axillary lymph nodes. 3. No  filling defect is identified in the pulmonary arterial tree to suggest pulmonary embolus. Electronically Signed   By: Gaylyn Rong M.D.   On: 03/12/2016 18:43   Mr Forearm Left Wo/w Cm  Result Date: 03/13/2016 CLINICAL DATA:  IV drug abuser with left forearm swelling and fever for 2-3 days. Cellulitis of the left forearm. Question abscess EXAM: MRI OF THE LEFT FOREARM WITHOUT AND WITH CONTRAST TECHNIQUE: Multiplanar, multisequence MR imaging was performed both before and after administration of intravenous contrast. CONTRAST:  10 ml MULTIHANCE GADOBENATE DIMEGLUMINE 529 MG/ML IV SOLN COMPARISON:  Plain films left elbow 03/12/2016. FINDINGS: Patient motion degrades this examination. Extensive subcutaneous edema and enhancement are present about the elbow and forearm and worse on the radial side. 2 fluid collections in subcutaneous fat are identified. The first is centered anterior to the radiocapitellar joint measures approximately 1.2 cm transverse by 1.3 cm AP by 2.7 cm craniocaudal. A second collection in the subcutaneous fatty tissues is positioned immediately lateral to the first described abscess. This abscess measures 1.5 cm AP by 1.1 cm transverse by approximately 5.2 cm craniocaudal. No intramuscular abscess is identified. The subcutaneous venous structures adjacent these abscesses demonstrate abnormal enhancement of their walls. These are likely the cephalic and median cubital veins. Findings are worrisome for septic thrombophlebitis. There is no evidence of septic joint or osteomyelitis. IMPRESSION: Cellulitis of the left forearm is worse at the elbow where 2 subcutaneous abscesses in the lateral soft tissues are identified. Findings compatible with septic  thrombophlebitis about the abscess is likely involving the cephalic and median cubital veins noted. Negative for myositis, septic joint or osteomyelitis. Electronically Signed   By: Drusilla Kanner M.D.   On: 03/13/2016 15:21       Scheduled Meds: . buprenorphine  16 mg Sublingual Daily  . busPIRone  10 mg Oral BID  . citalopram  10 mg Oral Daily  . clonazePAM  0.5 mg Oral Daily  . enoxaparin (LOVENOX) injection  40 mg Subcutaneous Q24H  . gabapentin  300 mg Oral TID  . piperacillin-tazobactam (ZOSYN)  IV  3.375 g Intravenous Q8H  . potassium chloride  40 mEq Oral Once  . vancomycin  750 mg Intravenous Q8H   Continuous Infusions:     LOS: 2 days        Huey Bienenstock, MD [facility] Pager: [phone] [email or web address] Password [facility] 03/14/2016, 10:34 AM

## 2016-03-14 NOTE — Progress Notes (Signed)
Pharmacy Antibiotic Note  Savannah Palmer is a 25 y.o. female admitted on 03/12/2016 with sepsis.  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  She has abscess on left arm that has worsened over past 3 days (not at site of injections her patient report.) Patient's currently on vancomycin and zosyn for elbow cellulitis/abscess, UTI, PNA, and suspected lung abscess.  9/25 CTangio: cavity nodule-- > ? Septic emboli or small cavity PNA - MD doesn't suspect TB 9/26 MRI left forearm: cellulitis with 2 subcutaneous abscesses   Today, 03/14/2016: - afeb, wbc elevated but down -  scr 0.59 (crcl~88) on 9/26 - Ecoli in ucx - Vancomycin trough is 11 mcg/ml at steady state  Plan: - Change Vancomycin to  1000 mg IV q8h (goal VT 15-2020mcg/ml) - Zosyn 3.375gm IV every 8 hours (infuse over 4 hours) - Follow-up cx  _______________________  Height: 5\' 3"  (160 cm) Weight: 129 lb (58.5 kg) IBW/kg (Calculated) : 52.4  Temp (24hrs), Avg:98 F (36.7 C), Min:97.2 F (36.2 C), Max:98.7 F (37.1 C)   Recent Labs Lab 03/12/16 1637 03/12/16 1652 03/12/16 1756 03/12/16 2021 03/13/16 0330 03/14/16 1730  WBC 26.5*  --   --   --  21.5*  --   CREATININE 0.63  --  0.70  --  0.59  --   LATICACIDVEN  --  1.22 1.00 1.11 1.3  --   VANCOTROUGH  --   --   --   --   --  11*    Estimated Creatinine Clearance: 88.2 mL/min (by C-G formula based on SCr of 0.59 mg/dL).    No Known Allergies  Antimicrobials this admission: 9/25 Zosyn >>  9/25 Vanc >>    Dose adjustments this admission: 9/27 Change vancomycin from 750 mg q8h to 1000 mg q8h  Microbiology results: 9/25 BCx x2:  9/25 UCx: >100K ecoli (R= ampi, unasyn, bactrim) 9/25 Rapid Strep Screen: negative 9/26 HIV antb:  9/25 MRSA PCR (+)  Thank you for allowing pharmacy to be a part of this patient's care.   Adalberto ColeNikola Chad Donoghue, PharmD, BCPS Pager [phone] 03/14/2016 7:54 PM

## 2016-03-14 NOTE — Progress Notes (Signed)
Date:  March 14, 2016 Chart reviewed for concurrent status and case management needs. Will continue to follow the patient for changes and needs: pt is now off airborne precautions/to IR today for assisted drainage of the abcess of the left forearm due to drug abuse/iv vancomycin and iv zosyn. Discharge Planning: following for needs Marcelle SmilingRhonda Davis, BSN, EnterpriseRN3, [place]CCM   [phone]

## 2016-03-14 NOTE — Progress Notes (Addendum)
Pharmacy Antibiotic Note  Savannah Palmer is a 25 y.o. female admitted on 03/12/2016 with sepsis.  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  She has abscess on left arm that has worsened over past 3 days (not at sight of injections her patient report.) Patient's currently on vancomycin and zosyn for elbow cellulitis/abscess, UTI, PNA, and suspected lung abscess.  9/25 CTangio: cavity nodule-- > ? Septic emboli or small cavity PNA - MD doesn't suspect TB 9/26 MRI left forearm: cellulitis with 2 subcutaneous abscesses   Today, 03/14/2016: - afeb, wbc elevated but down -  scr 0.59 (crcl~88) on 9/26 - Ecoli in ucx  Plan: - Vancomycin  750mg  IV q8h (goal VT 15-7320mcg/ml) - Check Vancomycin trough with dose this evening - Zosyn 3.375gm IV every 8 hours (infuse over 4 hours) - Follow-up cx  _______________________  Height: 5\' 3"  (160 cm) Weight: 120 lb (54.4 kg) IBW/kg (Calculated) : 52.4  Temp (24hrs), Avg:98.3 F (36.8 C), Min:97.2 F (36.2 C), Max:98.7 F (37.1 C)   Recent Labs Lab 03/12/16 1637 03/12/16 1652 03/12/16 1756 03/12/16 2021 03/13/16 0330  WBC 26.5*  --   --   --  21.5*  CREATININE 0.63  --  0.70  --  0.59  LATICACIDVEN  --  1.22 1.00 1.11 1.3    Estimated Creatinine Clearance: 88.2 mL/min (by C-G formula based on SCr of 0.59 mg/dL).    No Known Allergies  Antimicrobials this admission: 9/25 Zosyn >>  9/25 Vanc >>    Dose adjustments this admission: n/a   Microbiology results: 9/25 BCx x2:  9/25 UCx: >100K ecoli (R= ampi, unasyn, bactrim) 9/25 Rapid Strep Screen: negative 9/26 HIV antb:  9/25 MRSA PCR (+)  Thank you for allowing pharmacy to be a part of this patient's care.  Lucia Gaskinsham, Thelma Lorenzetti P 03/14/2016 10:47 AM

## 2016-03-14 NOTE — Progress Notes (Signed)
*  PRELIMINARY RESULTS* Echocardiogram 2D Echocardiogram has been performed.  Jeryl Columbialliott, Breena Bevacqua 03/14/2016, 9:03 AM

## 2016-03-15 DIAGNOSIS — L03114 Cellulitis of left upper limb: Secondary | ICD-10-CM

## 2016-03-15 LAB — BASIC METABOLIC PANEL
ANION GAP: 9 (ref 5–15)
BUN: <5 mg/dL — ABNORMAL LOW (ref 6–20)
CALCIUM: 8.4 mg/dL — AB (ref 8.9–10.3)
CO2: 27 mmol/L (ref 22–32)
Chloride: 104 mmol/L (ref 101–111)
Creatinine, Ser: 0.65 mg/dL (ref 0.44–1.00)
Glucose, Bld: 94 mg/dL (ref 65–99)
POTASSIUM: 3.1 mmol/L — AB (ref 3.5–5.1)
SODIUM: 140 mmol/L (ref 135–145)

## 2016-03-15 LAB — MAGNESIUM: Magnesium: 1.8 mg/dL (ref 1.7–2.4)

## 2016-03-15 LAB — QUANTIFERON IN TUBE
QFT TB AG MINUS NIL VALUE: 0.02 IU/mL
QUANTIFERON MITOGEN VALUE: 0.25 IU/mL
QUANTIFERON NIL VALUE: 0.05 [IU]/mL
QUANTIFERON TB AG VALUE: 0.07 [IU]/mL
QUANTIFERON TB GOLD: UNDETERMINED

## 2016-03-15 LAB — CBC
HCT: 29.2 % — ABNORMAL LOW (ref 36.0–46.0)
Hemoglobin: 9.8 g/dL — ABNORMAL LOW (ref 12.0–15.0)
MCH: 28.3 pg (ref 26.0–34.0)
MCHC: 33.6 g/dL (ref 30.0–36.0)
MCV: 84.4 fL (ref 78.0–100.0)
PLATELETS: 392 10*3/uL (ref 150–400)
RBC: 3.46 MIL/uL — AB (ref 3.87–5.11)
RDW: 13.8 % (ref 11.5–15.5)
WBC: 11.5 10*3/uL — AB (ref 4.0–10.5)

## 2016-03-15 LAB — QUANTIFERON TB GOLD ASSAY (BLOOD)

## 2016-03-15 LAB — PHOSPHORUS: Phosphorus: 3.8 mg/dL (ref 2.5–4.6)

## 2016-03-15 MED ORDER — SODIUM CHLORIDE 0.9 % IV SOLN
1.5000 g | Freq: Four times a day (QID) | INTRAVENOUS | Status: DC
  Administered 2016-03-15 – 2016-03-16 (×4): 1.5 g via INTRAVENOUS
  Filled 2016-03-15 (×6): qty 1.5

## 2016-03-15 MED ORDER — POTASSIUM CHLORIDE CRYS ER 20 MEQ PO TBCR
30.0000 meq | EXTENDED_RELEASE_TABLET | Freq: Four times a day (QID) | ORAL | Status: DC
  Administered 2016-03-15 – 2016-03-16 (×5): 30 meq via ORAL
  Filled 2016-03-15 (×5): qty 1
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
## 2016-03-15 NOTE — Progress Notes (Signed)
Pharmacy Antibiotic Note  Savannah Palmer is a 25 y.o. female admitted on 03/12/2016 with sepsis.  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  She has abscess on left arm that has worsened over past 3 days (not at site of injections her patient report.) Patient's currently on vancomycin and zosyn for elbow cellulitis/abscess, UTI, PNA, and suspected lung abscess.  Today, 9/28, narrowing Zosyn to Unasyn.  Plan:  Unasyn 1.5g IV q6h  Continue vancomycin as previously ordered  Follow up renal function & cultures _______________________  Height: 5\' 3"  (160 cm) Weight: 129 lb (58.5 kg) IBW/kg (Calculated) : 52.4  Temp (24hrs), Avg:98.2 F (36.8 C), Min:97.8 F (36.6 C), Max:98.9 F (37.2 C)   Recent Labs Lab 03/12/16 1637 03/12/16 1652 03/12/16 1756 03/12/16 2021 03/13/16 0330 03/14/16 1730 03/15/16 0544  WBC 26.5*  --   --   --  21.5*  --  11.5*  CREATININE 0.63  --  0.70  --  0.59  --  0.65  LATICACIDVEN  --  1.22 1.00 1.11 1.3  --   --   VANCOTROUGH  --   --   --   --   --  11*  --     Estimated Creatinine Clearance: 88.2 mL/min (by C-G formula based on SCr of 0.65 mg/dL).    No Known Allergies  Antimicrobials this admission: 9/25 Vanc >>  9/25 Zosyn >>  9/28 9/28 Unasyn >>   Dose adjustments this admission: 9/27 Change vancomycin from 750 mg q8h to 1000 mg q8h  Microbiology results: 9/25BCx x2: NGTD 9/25UCx: >100K ecoli (R= ampi, unasyn, bactrim) 9/25 Rapid Strep Screen: negative 9/26 HIV antb: non reactive 9/25 MRSA PCR (+)  Thank you for allowing pharmacy to be a part of this patient's care.  Loralee PacasErin Jordana Dugue, PharmD, BCPS Pager: [phone] 03/15/2016 3:20 PM

## 2016-03-15 NOTE — Progress Notes (Signed)
LCSW continues to assist with options at DC for patient since she is interested at this time for inpatient SA.  [facility] agree[facility] to the referral however first open female bed is October 9th. LCSW explored with patient if she wanted to [facility] [place],  But she declined.  LCSW has also reached out to the [employer] in effort to run her [place] and [place] across [place].  They are actively reviewing to see if appropriate and financially what she is responsible for and what will be covered.  Patient is also on Suboxone.  She follows at [facility] She reports if she cannot get into rehab she will just maintain on her Suboxone and DC home. She does not know where she will go, but plans to call her father, but has to be clean to stay with father.  LCSW will continue to help patient navigate options and follow up on referrals.  Savannah EmoryHannah Onesti Bonfiglio LCSW, MSW Clinical Social Work: Optician, dispensingystem [employer] Coverage for :  [employer]  [phone]

## 2016-03-15 NOTE — Progress Notes (Signed)
PROGRESS NOTE    JESSICAH CROLL  LEX:[id number] DOB: 26-May-1991 DOA: 03/12/2016  PCP: Default, Provider, MD   Brief Narrative:  This is a 25 year old female with a history of heroin abuse who was on treatment with Suboxone but since it finished, and she could not get a new prescription yet, she went back to abusing heroin. She states she has been injecting heroin about twice a day for 1-2 weeks now. She presents with swelling of her left forearm near her elbow, cough and fevers for the past 2-3 days. She is found to have left arm cellulitis versus abscess and a 10 mm lung abscess. Temperature was noted to be 101.8.  Subjective: Reports she is feeling much better today, denies nausea vomiting or cough  Assessment & Plan:  sepsis with leukocytosis, tachycardia and fever  -Possibly due to cavitary pneumonia from septic embolism, as well left forearm abscess. - echo is with no evidence of vegetation -Treated with vancomycin and Zosyn for now which will cover both lung abscess and cellulitis, discussed with ID, can change Zosyn to Unasyn, and if blood cultures remain negative, can be discharged on by mouth doxycycline and Augmentin. - Note MRSA PCR is positive - Blood cultures remains with no growth to date, continue to follow -TB is low on the differential but has been placed on precautions by infection control, discussed with ID Dr. Orvan Falconer, no need for airborne precaution of TB rule out, will discontinue precaution.    Left arm Cellulitis and abscess - MRI shows abscess- Dr Roda Shutters recommended IR guided drainage, discussed with IR, they reported there is no role for intervention will continue with IV vancomycin and Unasyn, discussed with Dr. Roda Shutters, he will reassess the NICU tomorrow, and thereafter decision will be made about appropriate intervention  UTI - Urine culture growing Escherichia coli, treated with Zosyn  Murmur, heart -Evidence of bowel heart disease or vegetation  Hepatitis C  antibody test positive -Will need further outpatient follow-up and treatment if she is ever able to abstain from drug abuse  Polysubstance dependence including opioid drug with daily use  -Monitor for heroin withdrawal-she is not exhibiting any such symptoms as of yet -HIV checked recently in July was negative    Normocytic anemia -We'll request an anemia panel for the morning  Hypokalemia -Replace   DVT prophylaxis: Lovenox Code Status: Full code Family Communication: None at bedside Disposition Plan: Pending further workup Consultants:   Orthopedic Procedures:    Antimicrobials:  Anti-infectives    Start     Dose/Rate Route Frequency Ordered Stop   03/15/16 0200  vancomycin (VANCOCIN) IVPB 1000 mg/200 mL premix     1,000 mg 200 mL/hr over 60 Minutes Intravenous Every 8 hours 03/14/16 1955     03/13/16 0200  vancomycin (VANCOCIN) IVPB 750 mg/150 ml premix  Status:  Discontinued     750 mg 150 mL/hr over 60 Minutes Intravenous Every 8 hours 03/12/16 1744 03/14/16 1955   03/13/16 0000  piperacillin-tazobactam (ZOSYN) IVPB 3.375 g     3.375 g 12.5 mL/hr over 240 Minutes Intravenous Every 8 hours 03/12/16 1744     03/12/16 1715  piperacillin-tazobactam (ZOSYN) IVPB 3.375 g     3.375 g 100 mL/hr over 30 Minutes Intravenous  Once 03/12/16 1706 03/12/16 1756   03/12/16 1715  vancomycin (VANCOCIN) IVPB 1000 mg/200 mL premix     1,000 mg 200 mL/hr over 60 Minutes Intravenous  Once 03/12/16 1706 03/12/16 1938       Objective:  Vitals:   03/14/16 1756 03/14/16 2143 03/15/16 0555 03/15/16 1431  BP: 111/69 108/64 114/76 102/73  Pulse: 87 (!) 112 81 84  Resp: 14 16 16 17   Temp: 98.1 F (36.7 C) 98.9 F (37.2 C) 98.2 F (36.8 C) 97.8 F (36.6 C)  TempSrc: Oral Oral Oral Oral  SpO2: 100% 100% 98% 98%  Weight: 58.5 kg (129 lb)     Height: 5\' 3"  (1.6 m)       Intake/Output Summary (Last 24 hours) at 03/15/16 1510 Last data filed at 03/15/16 0900  Gross per 24 hour    Intake              240 ml  Output                0 ml  Net              240 ml   Filed Weights   03/12/16 1213 03/14/16 1756  Weight: 54.4 kg (120 lb) 58.5 kg (129 lb)    Examination: General exam: Appears comfortable  HEENT: PERRLA, oral mucosa moist, no sclera icterus or thrush Respiratory system: Clear to auscultation. Respiratory effort normal. Cardiovascular system: S1 & S2 heard, RRR.  No murmurs  Gastrointestinal system: Abdomen soft, non-tender, nondistended. Normal bowel sound. No organomegaly Central nervous system: Alert and oriented. No focal neurological deficits. Extremities: No cyanosis, clubbing or edema- erythema and edema of dorsal aspect of left arm near elbow extended to the forearm Skin: No rashes or ulcers Psychiatry:  Mood & affect appropriate.     Data Reviewed: I have personally reviewed following labs and imaging studies  CBC:  Recent Labs Lab 03/12/16 1637 03/12/16 1756 03/13/16 0330 03/15/16 0544  WBC 26.5*  --  21.5* 11.5*  NEUTROABS 21.7*  --  17.0*  --   HGB 11.0* 10.9* 10.1* 9.8*  HCT 30.3* 32.0* 28.7* 29.2*  MCV 80.4  --  84.4 84.4  PLT 316  --  299 392   Basic Metabolic Panel:  Recent Labs Lab 03/12/16 1637 03/12/16 1756 03/13/16 0330 03/15/16 0544  NA 129* 129* 137 140  K 2.6* 2.9* 3.3* 3.1*  CL 92* 91* 105 104  CO2 25  --  24 27  GLUCOSE 118* 117* 112* 94  BUN <5* <3* <5* <5*  CREATININE 0.63 0.70 0.59 0.65  CALCIUM 8.1*  --  7.6* 8.4*  MG 1.6*  --   --  1.8  PHOS  --   --   --  3.8   GFR: Estimated Creatinine Clearance: 88.2 mL/min (by C-G formula based on SCr of 0.65 mg/dL). Liver Function Tests:  Recent Labs Lab 03/13/16 0330  AST 30  ALT 21  ALKPHOS 115  BILITOT 0.6  PROT 6.2*  ALBUMIN 2.7*   No results for input(s): LIPASE, AMYLASE in the last 168 hours. No results for input(s): AMMONIA in the last 168 hours. Coagulation Profile: No results for input(s): INR, PROTIME in the last 168 hours. Cardiac  Enzymes: No results for input(s): CKTOTAL, CKMB, CKMBINDEX, TROPONINI in the last 168 hours. BNP (last 3 results) No results for input(s): PROBNP in the last 8760 hours. HbA1C: No results for input(s): HGBA1C in the last 72 hours. CBG: No results for input(s): GLUCAP in the last 168 hours. Lipid Profile: No results for input(s): CHOL, HDL, LDLCALC, TRIG, CHOLHDL, LDLDIRECT in the last 72 hours. Thyroid Function Tests: No results for input(s): TSH, T4TOTAL, FREET4, T3FREE, THYROIDAB in the last 72 hours. Anemia Panel:  Recent Labs  03/14/16 0342  VITAMINB12 890  FOLATE 11.3  FERRITIN 237  TIBC 203*  IRON 34  RETICCTPCT 1.7   Urine analysis:    Component Value Date/Time   COLORURINE YELLOW 03/12/2016 1555   APPEARANCEUR CLOUDY (A) 03/12/2016 1555   LABSPEC 1.010 03/12/2016 1555   PHURINE 7.0 03/12/2016 1555   GLUCOSEU NEGATIVE 03/12/2016 1555   HGBUR MODERATE (A) 03/12/2016 1555   BILIRUBINUR NEGATIVE 03/12/2016 1555   KETONESUR NEGATIVE 03/12/2016 1555   PROTEINUR 30 (A) 03/12/2016 1555   UROBILINOGEN 0.2 08/05/2010 1821   NITRITE POSITIVE (A) 03/12/2016 1555   LEUKOCYTESUR MODERATE (A) 03/12/2016 1555   Sepsis Labs: @LABRCNTIP (procalcitonin:4,lacticidven:4) ) Recent Results (from the past 240 hour(s))  Rapid strep screen     Status: None   Collection Time: 03/12/16  3:39 PM  Result Value Ref Range Status   Streptococcus, Group A Screen (Direct) NEGATIVE NEGATIVE Final    Comment: (NOTE) A Rapid Antigen test may result negative if the antigen level in the sample is below the detection level of this test. The FDA has not cleared this test as a stand-alone test therefore the rapid antigen negative result has reflexed to a Group A Strep culture.   Culture, group A strep     Status: None (Preliminary result)   Collection Time: 03/12/16  3:39 PM  Result Value Ref Range Status   Specimen Description THROAT  Final   Special Requests NONE Reflexed from [id number]  Final     Culture   Final    CULTURE REINCUBATED FOR BETTER GROWTH Performed at [facility]    Report Status PENDING  Incomplete  Urine culture     Status: Abnormal   Collection Time: 03/12/16  3:59 PM  Result Value Ref Range Status   Specimen Description URINE, RANDOM  Final   Special Requests NONE  Final   Culture >=100,000 COLONIES/mL ESCHERICHIA COLI (A)  Final   Report Status 03/14/2016 FINAL  Final   Organism ID, Bacteria ESCHERICHIA COLI (A)  Final      Susceptibility   Escherichia coli - MIC*    AMPICILLIN >=32 RESISTANT Resistant     CEFAZOLIN 8 SENSITIVE Sensitive     CEFTRIAXONE <=1 SENSITIVE Sensitive     CIPROFLOXACIN 1 SENSITIVE Sensitive     GENTAMICIN <=1 SENSITIVE Sensitive     IMIPENEM <=0.25 SENSITIVE Sensitive     NITROFURANTOIN <=16 SENSITIVE Sensitive     TRIMETH/SULFA >=320 RESISTANT Resistant     AMPICILLIN/SULBACTAM >=32 RESISTANT Resistant     PIP/TAZO <=4 SENSITIVE Sensitive     Extended ESBL NEGATIVE Sensitive     * >=100,000 COLONIES/mL ESCHERICHIA COLI  Culture, blood (routine x 2)     Status: None (Preliminary result)   Collection Time: 03/12/16  4:37 PM  Result Value Ref Range Status   Specimen Description BLOOD RIGHT FOREARM  Final   Special Requests BOTTLES DRAWN AEROBIC AND ANAEROBIC 5 CC EA  Final   Culture   Final    NO GROWTH 2 DAYS Performed at [facility]    Report Status PENDING  Incomplete  Culture, blood (routine x 2)     Status: None (Preliminary result)   Collection Time: 03/12/16  4:37 PM  Result Value Ref Range Status   Specimen Description BLOOD LEFT HAND  Final   Special Requests BOTTLES DRAWN AEROBIC AND ANAEROBIC 5 CC EA  Final   Culture   Final    NO GROWTH 2 DAYS Performed at  [facility]    Report Status PENDING  Incomplete  MRSA PCR Screening     Status: Abnormal   Collection Time: 03/12/16 10:35 PM  Result Value Ref Range Status   MRSA by PCR POSITIVE (A) NEGATIVE Final    Comment:        The  GeneXpert MRSA Assay (FDA approved for NASAL specimens only), is one component of a comprehensive MRSA colonization surveillance program. It is not intended to diagnose MRSA infection nor to guide or monitor treatment for MRSA infections. RESULT CALLED TO, READ BACK BY AND VERIFIED WITH: C.CORRIDON,RN [id number] 03/13/16 [facility]          Radiology Studies: No results found.    Scheduled Meds: . buprenorphine  16 mg Sublingual Daily  . busPIRone  10 mg Oral BID  . Chlorhexidine Gluconate Cloth  6 each Topical Q0600  . citalopram  10 mg Oral Daily  . clonazePAM  0.5 mg Oral Daily  . enoxaparin (LOVENOX) injection  40 mg Subcutaneous Q24H  . gabapentin  300 mg Oral TID  . mupirocin ointment  1 application Nasal BID  . piperacillin-tazobactam (ZOSYN)  IV  3.375 g Intravenous Q8H  . potassium chloride  30 mEq Oral Q6H  . vancomycin  1,000 mg Intravenous Q8H   Continuous Infusions:     LOS: 3 days        Vinessa Macconnell, MD [facility] Pager: [phone] [email or web address] Password [email or web address] 03/15/2016, 3:10 PM

## 2016-03-16 LAB — CULTURE, GROUP A STREP (THRC)

## 2016-03-16 MED ORDER — SACCHAROMYCES BOULARDII 250 MG PO CAPS: 250.0000 mg | ORAL_CAPSULE | Freq: Two times a day (BID) | ORAL | 0 refills | Status: DC

## 2016-03-16 MED ORDER — AMOXICILLIN-POT CLAVULANATE 875-125 MG PO TABS: 1.0000 | ORAL_TABLET | Freq: Two times a day (BID) | ORAL | 0 refills | Status: DC

## 2016-03-16 MED ORDER — SULFAMETHOXAZOLE-TRIMETHOPRIM 800-160 MG PO TABS: 1.0000 | ORAL_TABLET | Freq: Two times a day (BID) | ORAL | 0 refills | Status: AC

## 2016-03-16 NOTE — Discharge Summary (Signed)
Savannah Palmer, is a 25 y.o. female  DOB 1991/05/20  MRN [id number].  Admission date:  03/12/2016  Admitting Physician  Eduard Clos, MD  Discharge Date:  03/16/2016   Primary MD  Default, Provider, MD  Recommendations for primary care physician for things to follow:  - Patient needs to follow with orthopedic Dr. Roda Shutters in 1 week - Recommendation has been made for the patient to go for inpatient detox, patient was not agreeable, report she will go on her own for rehab  in 1-2 days. - Please check CBC, BMP in 1 week.   Admission Diagnosis  Septic embolism (HCC) [I26.90]   Discharge Diagnosis  Septic embolism (HCC) [I26.90]    Principal Problem:   Lung abscess (HCC) Active Problems:   Murmur, heart   Hepatitis C antibody test positive   Polysubstance dependence including opioid drug with daily use (HCC)   Normocytic anemia   Cellulitis      Past Medical History:  Diagnosis Date  . Anxiety   . Anxiety   . Depression     Past Surgical History:  Procedure Laterality Date  . CHOLECYSTECTOMY         History of present illness and  Hospital Course:     Kindly see H&P for history of present illness and admission details, please review complete Labs, Consult reports and Test reports for all details in brief  HPI  from the history and physical done on the day of admission 03/12/2016 HPI: Savannah Palmer is a 25 y.o. female with history of heart murmur with echocardiogram done in July this year showed no evidence of any valvular involvement with history of IV drug abuse presents to the ER because of increasing pain and swelling in the left elbow. Patient admits to have taken IV drugs but has injected on the right hand. Patient's left elbow swelling has been present for last for 5 days with increasing difficulty to completely straighten it. In the ER patient was found to be febrile with  temperatures around 101F, tachycardic. Labs showed leukocytosis. D-dimer was done which was elevated. CT angiogram of the chest and showed cavitary nodule in the right side of the long concerning for septic emboli versus cavitary pneumonia.  Patient states she has been having productive cough for last few days and has been having fever and chills. Denies any chest pain dizziness or loss of consciousness nausea vomiting diarrhea abdominal pain or any skin rash.   ED Course: Patient was given fluid bolus and started on empiric antibiotics after blood cultures were obtained.    Hospital Course   This is a 25 year old female with a history of heroin abuse who was on treatment with Suboxone but since it finished, and she could not get a new prescription yet, she went back to abusing heroin. She states she has been injecting heroin about twice a day for 1-2 weeks now. She presents with swelling of her left forearm near her elbow, cough and fevers for the past 2-3 days.  She is found to have left arm cellulitis versus abscess and a 10 mm lung abscess. Temperature was noted to be 101.8. Patient was treated with IV vancomycin and Zosyn during hospital stay, blood cultures remain negative, 2-D echo with no evidence of vegetation and physiology of sepsis has resolved at time of discharge.  sepsis with leukocytosis, tachycardia and fever  -Possibly due to cavitary pneumonia from septic embolism, as well left forearm abscess. - echo is with no evidence of vegetation -Treated with vancomycin and Zosyn for now which will cover both lung abscess and cellulitis, discussed with ID, can change Zosyn to Unasyn, and if blood cultures remain negative, can be discharged on by mouth doxycycline and Augmentin. Patient will be discharged on by mouth Augmentin for 7 days, and by mouth Bactrim DS for next 4 weeks. - Note MRSA PCR is positive - Blood cultures remains with no growth to date at time of discharge -TB is low  on the differential but has been placed on precautions by infection control, discussed with ID Dr. Orvan Falconer, no need for airborne precaution of TB rule out, will discontinue precaution.    Left arm Cellulitis and abscess - MRI shows abscess- Dr Roda Shutters recommended IR guided drainage, discussed with IR, they reported there is no role for intervention , patient was treated with IV vancomycin and Unasyn during hospital stay , was seen by orthopedic Dr. Roda Shutters at day of discharge, with significant improvement, recommendation is for 4 weeks to tell of Bactrim DS and to follow with his office in one week for recheck.  Polysubstance dependence including opioid drug with daily use  -No evidence of withdrawals during hospital stay -HIV checked recently in July was negative  UTI - Urine culture growing Escherichia coli, treated with Zosyn  Murmur, heart -Evidence of bowel heart disease or vegetation  Hepatitis C antibody test positive -Will need further outpatient follow-up and treatment if she is ever able to abstain from drug abuse    Normocytic anemia  Hypokalemia -Repleted   Discharge Condition:  Stable - Social worker arranging for inpatient detox, at this point patient reports she wants to be discharged home, and to be admitted for the Treatment Center over the weekend, was tested patients permission to talk to her mother or father about discharge plan and clinical condition (I did ask for permission multiple times on multiple occasions), but patient refused to give me permission to discuss with her parents, and reports she will take care it - Of Note: On my last visit  for the patient today prior  To discharge, she was on the cell phone, reports she is talking with the rehabilitation center to arrange for weekend admission.   Follow UP  Follow-up Information    Cheral Almas, MD. Schedule an appointment as soon as possible for a visit in 1 week(s).   Specialty:  Orthopedic  Surgery Why:  please follow in 1 week regarding abscess Contact information: [address] [place] [address] [phone]             Discharge Instructions  and  Discharge Medications    Discharge Instructions    Discharge instructions    Complete by:  As directed    Follow with Primary MD  in 7 days   Get CBC, CMP, checked  by Primary MD next visit.    Activity: As tolerated with Full fall precautions use walker/cane & assistance as needed   Disposition Home   Diet: Regular diet  On your next visit with your primary care physician please Get Medicines reviewed and adjusted.   Please request your Prim.MD to go over all Hospital Tests and Procedure/Radiological results at the follow up, please get all Hospital records sent to your Prim MD by signing hospital release before you go home.   If you experience worsening of your admission symptoms, develop shortness of breath, life threatening emergency, suicidal or homicidal thoughts you must seek medical attention immediately by calling 911 or calling your MD immediately  if symptoms less severe.  You Must read complete instructions/literature along with all the possible adverse reactions/side effects for all the Medicines you take and that have been prescribed to you. Take any new Medicines after you have completely understood and accpet all the possible adverse reactions/side effects.   Do not drive, operating heavy machinery, perform activities at heights, swimming or participation in water activities or provide baby sitting services if your were admitted for syncope or siezures until you have seen by Primary MD or a Neurologist and advised to do so again.  Do not drive when taking Pain medications.    Do not take more than prescribed Pain, Sleep and Anxiety Medications  Special Instructions: If you have smoked or chewed Tobacco  in the last 2 yrs please stop smoking, stop any regular Alcohol  and or any  Recreational drug use.  Wear Seat belts while driving.   Please note  You were cared for by a hospitalist during your hospital stay. If you have any questions about your discharge medications or the care you received while you were in the hospital after you are discharged, you can call the unit and asked to speak with the hospitalist on call if the hospitalist that took care of you is not available. Once you are discharged, your primary care physician will handle any further medical issues. Please note that NO REFILLS for any discharge medications will be authorized once you are discharged, as it is imperative that you return to your primary care physician (or establish a relationship with a primary care physician if you do not have one) for your aftercare needs so that they can reassess your need for medications and monitor your lab values.   Increase activity slowly    Complete by:  As directed        Medication List    STOP taking these medications   traZODone 100 MG tablet Commonly known as:  DESYREL     TAKE these medications   amoxicillin-clavulanate 875-125 MG tablet Commonly known as:  AUGMENTIN Take 1 tablet by mouth 2 (two) times daily.   busPIRone 10 MG tablet Commonly known as:  BUSPAR Take 10 mg by mouth 2 (two) times daily.   citalopram 10 MG tablet Commonly known as:  CELEXA Take 1 tablet (10 mg total) by mouth daily.   clonazePAM 0.5 MG tablet Commonly known as:  KLONOPIN Take 0.5 tablets (0.25 mg total) by mouth daily. What changed:  how much to take   gabapentin 300 MG capsule Commonly known as:  NEURONTIN Take 1 capsule (300 mg total) by mouth 3 (three) times daily.   nicotine polacrilex 2 MG gum Commonly known as:  NICORETTE Take 1 each (2 mg total) by mouth as needed for smoking cessation.   saccharomyces boulardii 250 MG capsule Commonly known as:  FLORASTOR Take 1 capsule (250 mg total) by mouth 2 (two) times daily.   SUBOXONE 8-2 MG  Film Generic drug:  Buprenorphine HCl-Naloxone  HCl Take 2.5 each by mouth daily.   sulfamethoxazole-trimethoprim 800-160 MG tablet Commonly known as:  BACTRIM DS,SEPTRA DS Take 1 tablet by mouth 2 (two) times daily.         Diet and Activity recommendation: See Discharge Instructions above   Consults obtained -  ortho   Major procedures and Radiology Reports - PLEASE review detailed and final reports for all details, in brief -      Dg Chest 2 View  Result Date: 03/12/2016 CLINICAL DATA:  Left elbow pain and swelling. EXAM: CHEST  2 VIEW COMPARISON:  01/12/2016 FINDINGS: The heart size and mediastinal contours are within normal limits. Both lungs are clear. The visualized skeletal structures are unremarkable. IMPRESSION: Normal chest x-ray. Electronically Signed   By: Rudie Meyer M.D.   On: 03/12/2016 16:16   Dg Elbow Complete Left  Result Date: 03/12/2016 CLINICAL DATA:  Left elbow abscess. EXAM: LEFT ELBOW - COMPLETE 3+ VIEW COMPARISON:  None. FINDINGS: There is no evidence of fracture, dislocation, or joint effusion. There is no evidence of arthropathy or other focal bone abnormality. Soft tissues are unremarkable. IMPRESSION: Normal left elbow. Electronically Signed   By: Lupita Raider, M.D.   On: 03/12/2016 16:16   Ct Angio Chest Pe W/cm &/or Wo Cm  Result Date: 03/12/2016 CLINICAL DATA:  Heroin use.  Elevated D-dimer. EXAM: CT ANGIOGRAPHY CHEST WITH CONTRAST TECHNIQUE: Multidetector CT imaging of the chest was performed using the standard protocol during bolus administration of intravenous contrast. Multiplanar CT image reconstructions and MIPs were obtained to evaluate the vascular anatomy. CONTRAST:  100 cc Isovue 370 COMPARISON:  03/12/2016 radiographs FINDINGS: Cardiovascular: No filling defect is identified in the pulmonary arterial tree to suggest pulmonary embolus. No acute aortic findings. Heart size normal. No pericardial effusion. Mediastinum/Nodes: Minimal  anterior mediastinal density probably from thymic tissue. No overlying sternal abnormality. Left axillary node 1.1 cm in short axis image 23/4. Lungs/Pleura: 10 mm cavitary nodule in the right lower lobe, image 55/11. Upper Abdomen: Heterogeneous enhancement of the spleen, possibly due to the early phase of contrast. Musculoskeletal: Unremarkable Review of the MIP images confirms the above findings. IMPRESSION: 1. 10 mm cavitary nodule in the right lower lobe with somewhat thick indistinct walls. Septic embolus or small cavitary pneumonia favored. 2. Upper normal size left axillary lymph nodes. 3. No filling defect is identified in the pulmonary arterial tree to suggest pulmonary embolus. Electronically Signed   By: Gaylyn Rong M.D.   On: 03/12/2016 18:43   Mr Forearm Left Wo/w Cm  Result Date: 03/13/2016 CLINICAL DATA:  IV drug abuser with left forearm swelling and fever for 2-3 days. Cellulitis of the left forearm. Question abscess EXAM: MRI OF THE LEFT FOREARM WITHOUT AND WITH CONTRAST TECHNIQUE: Multiplanar, multisequence MR imaging was performed both before and after administration of intravenous contrast. CONTRAST:  10 ml MULTIHANCE GADOBENATE DIMEGLUMINE 529 MG/ML IV SOLN COMPARISON:  Plain films left elbow 03/12/2016. FINDINGS: Patient motion degrades this examination. Extensive subcutaneous edema and enhancement are present about the elbow and forearm and worse on the radial side. 2 fluid collections in subcutaneous fat are identified. The first is centered anterior to the radiocapitellar joint measures approximately 1.2 cm transverse by 1.3 cm AP by 2.7 cm craniocaudal. A second collection in the subcutaneous fatty tissues is positioned immediately lateral to the first described abscess. This abscess measures 1.5 cm AP by 1.1 cm transverse by approximately 5.2 cm craniocaudal. No intramuscular abscess is identified. The subcutaneous venous structures  adjacent these abscesses demonstrate abnormal  enhancement of their walls. These are likely the cephalic and median cubital veins. Findings are worrisome for septic thrombophlebitis. There is no evidence of septic joint or osteomyelitis. IMPRESSION: Cellulitis of the left forearm is worse at the elbow where 2 subcutaneous abscesses in the lateral soft tissues are identified. Findings compatible with septic thrombophlebitis about the abscess is likely involving the cephalic and median cubital veins noted. Negative for myositis, septic joint or osteomyelitis. Electronically Signed   By: Drusilla Kanner M.D.   On: 03/13/2016 15:21    Micro Results     Recent Results (from the past 240 hour(s))  Rapid strep screen     Status: None   Collection Time: 03/12/16  3:39 PM  Result Value Ref Range Status   Streptococcus, Group A Screen (Direct) NEGATIVE NEGATIVE Final    Comment: (NOTE) A Rapid Antigen test may result negative if the antigen level in the sample is below the detection level of this test. The FDA has not cleared this test as a stand-alone test therefore the rapid antigen negative result has reflexed to a Group A Strep culture.   Culture, group A strep     Status: None   Collection Time: 03/12/16  3:39 PM  Result Value Ref Range Status   Specimen Description THROAT  Final   Special Requests NONE Reflexed from [id number]  Final   Culture   Final    NO GROUP A STREP (S.PYOGENES) ISOLATED Performed at [facility]    Report Status 03/16/2016 FINAL  Final  Urine culture     Status: Abnormal   Collection Time: 03/12/16  3:59 PM  Result Value Ref Range Status   Specimen Description URINE, RANDOM  Final   Special Requests NONE  Final   Culture >=100,000 COLONIES/mL ESCHERICHIA COLI (A)  Final   Report Status 03/14/2016 FINAL  Final   Organism ID, Bacteria ESCHERICHIA COLI (A)  Final      Susceptibility   Escherichia coli - MIC*    AMPICILLIN >=32 RESISTANT Resistant     CEFAZOLIN 8 SENSITIVE Sensitive     CEFTRIAXONE <=1  SENSITIVE Sensitive     CIPROFLOXACIN 1 SENSITIVE Sensitive     GENTAMICIN <=1 SENSITIVE Sensitive     IMIPENEM <=0.25 SENSITIVE Sensitive     NITROFURANTOIN <=16 SENSITIVE Sensitive     TRIMETH/SULFA >=320 RESISTANT Resistant     AMPICILLIN/SULBACTAM >=32 RESISTANT Resistant     PIP/TAZO <=4 SENSITIVE Sensitive     Extended ESBL NEGATIVE Sensitive     * >=100,000 COLONIES/mL ESCHERICHIA COLI  Culture, blood (routine x 2)     Status: None (Preliminary result)   Collection Time: 03/12/16  4:37 PM  Result Value Ref Range Status   Specimen Description BLOOD RIGHT FOREARM  Final   Special Requests BOTTLES DRAWN AEROBIC AND ANAEROBIC 5 CC EA  Final   Culture   Final    NO GROWTH 3 DAYS Performed at [facility]    Report Status PENDING  Incomplete  Culture, blood (routine x 2)     Status: None (Preliminary result)   Collection Time: 03/12/16  4:37 PM  Result Value Ref Range Status   Specimen Description BLOOD LEFT HAND  Final   Special Requests BOTTLES DRAWN AEROBIC AND ANAEROBIC 5 CC EA  Final   Culture   Final    NO GROWTH 3 DAYS Performed at [facility]    Report Status PENDING  Incomplete  MRSA  PCR Screening     Status: Abnormal   Collection Time: 03/12/16 10:35 PM  Result Value Ref Range Status   MRSA by PCR POSITIVE (A) NEGATIVE Final    Comment:        The GeneXpert MRSA Assay (FDA approved for NASAL specimens only), is one component of a comprehensive MRSA colonization surveillance program. It is not intended to diagnose MRSA infection nor to guide or monitor treatment for MRSA infections. RESULT CALLED TO, READ BACK BY AND VERIFIED WITH: C.CORRIDON,RN [id number] 03/13/16 W.SHEA        Today   Subjective:   Savannah Palmer today has no headache,no chest abdominal pain,no new weakness tingling or numbness, feels much better wants to go home today.   Objective:   Blood pressure (!) 98/56, pulse 77, temperature 98.6 F (37 C), temperature source  Oral, resp. rate 18, height 5\' 3"  (1.6 m), weight 58.5 kg (129 lb), last menstrual period 02/27/2016, SpO2 98 %.   Intake/Output Summary (Last 24 hours) at 03/16/16 1346 Last data filed at 03/16/16 1030  Gross per 24 hour  Intake              910 ml  Output                0 ml  Net              910 ml    Exam Awake Alert, Oriented x 3, No new F.N deficits, Normal affect Emma.AT,PERRAL Supple Neck,No JVD, No cervical lymphadenopathy appriciated.  Symmetrical Chest wall movement, Good air movement bilaterally, CTAB RRR,No Gallops,Rubs or new Murmurs, No Parasternal Heave +ve B.Sounds, Abd Soft, Non tender, No organomegaly appriciated, No rebound -guarding or rigidity. No Cyanosis, Clubbing or edema, No new Rash or bruise  Data Review   CBC w Diff: Lab Results  Component Value Date   WBC 11.5 (H) 03/15/2016   HGB 9.8 (L) 03/15/2016   HCT 29.2 (L) 03/15/2016   PLT 392 03/15/2016   LYMPHOPCT 14 03/13/2016   MONOPCT 7 03/13/2016   EOSPCT 0 03/13/2016   BASOPCT 0 03/13/2016    CMP: Lab Results  Component Value Date   NA 140 03/15/2016   K 3.1 (L) 03/15/2016   CL 104 03/15/2016   CO2 27 03/15/2016   BUN <5 (L) 03/15/2016   CREATININE 0.65 03/15/2016   PROT 6.2 (L) 03/13/2016   ALBUMIN 2.7 (L) 03/13/2016   BILITOT 0.6 03/13/2016   ALKPHOS 115 03/13/2016   AST 30 03/13/2016   ALT 21 03/13/2016  .   Total Time in preparing paper work, data evaluation and todays exam - 35 minutes  Zoya Sprecher M.D on 03/16/2016 at 1:46 PM  [facility]   Office  [phone]

## 2016-03-16 NOTE — Discharge Instructions (Signed)
Follow with Primary MD  in 7 days   Get CBC, CMP,  checked  by Primary MD next visit.    Activity: As tolerated with Full fall precautions use walker/cane & assistance as needed   Disposition Home    Diet: Regular diet   On your next visit with your primary care physician please Get Medicines reviewed and adjusted.   Please request your Prim.MD to go over all Hospital Tests and Procedure/Radiological results at the follow up, please get all Hospital records sent to your Prim MD by signing hospital release before you go home.   If you experience worsening of your admission symptoms, develop shortness of breath, life threatening emergency, suicidal or homicidal thoughts you must seek medical attention immediately by calling 911 or calling your MD immediately  if symptoms less severe.  You Must read complete instructions/literature along with all the possible adverse reactions/side effects for all the Medicines you take and that have been prescribed to you. Take any new Medicines after you have completely understood and accpet all the possible adverse reactions/side effects.   Do not drive, operating heavy machinery, perform activities at heights, swimming or participation in water activities or provide baby sitting services if your were admitted for syncope or siezures until you have seen by Primary MD or a Neurologist and advised to do so again.  Do not drive when taking Pain medications.    Do not take more than prescribed Pain, Sleep and Anxiety Medications  Special Instructions: If you have smoked or chewed Tobacco  in the last 2 yrs please stop smoking, stop any regular Alcohol  and or any Recreational drug use.  Wear Seat belts while driving.   Please note  You were cared for by a hospitalist during your hospital stay. If you have any questions about your discharge medications or the care you received while you were in the hospital after you are discharged, you can call the  unit and asked to speak with the hospitalist on call if the hospitalist that took care of you is not available. Once you are discharged, your primary care physician will handle any further medical issues. Please note that NO REFILLS for any discharge medications will be authorized once you are discharged, as it is imperative that you return to your primary care physician (or establish a relationship with a primary care physician if you do not have one) for your aftercare needs so that they can reassess your need for medications and monitor your lab values.  

## 2016-03-16 NOTE — Progress Notes (Signed)
Patient given discharge, medication, and follow up instructions, verbalized understanding, printed prescriptions given, IV and telemetry removed, family to transport home

## 2016-03-16 NOTE — Progress Notes (Addendum)
Disposition:  LCSW made aware that patient to be discharged today per report of CM. LCSW has made contact with [facility] Director ([place]) [place] and completed referral for review.  Patient is open to out of state options as discussed with her on 9/28 as in state options are not available at this time.  Per patient's request and her agreement, clinicals were faxed to admission:  fax records to [phone], Attn: Mick Sell.    Patient to complete phone interview with treatment facility in which LCSW is arranging.  * There is no delay in discharge as patient can be discharged home to a safe place her mother has arranged and admit to treatment tonight or this weekend..*  LCSW has met with patient again this afternoon explaining need of her engagement if she is wanting treatment. Patient has phone in her bed and appears to be sleeping. She is aware of the treatment facility calling her and there is a Advertising account executive.  She reports she will call the treatment admissions team back at this time.  LCSW also asked for permission to speak with mother in which patient declines and reports she will take care of this.  LCSW feels motivation is low AEB her delay in arranging/completing assessment with admission and refusal to let LCSW speak with family. Patient is aware that there is no out of pocket cost for insurance, she just has to commit to plan and complete assessment of needs.     If patient does not make contact or refuses treatment at this time then she will have to be discharged home to self care as LCSW cannot force patient in substance abuse treatment.   Per intake with treatment:  Kathlee Nations has completed her assessment and made her treatment recommendations. She instructed the patient on how to follow through.  I hope that the patient will go through with this, but it is up to patient to follow up at this time.     Lane Hacker,  MSW Clinical Social Work: [employer] Coverage for :  [employer]  [phone]

## 2016-03-16 NOTE — Progress Notes (Signed)
Left arm is significantly better.  Cellulitis is pretty much completely resolved.  She still is slightly tender over the area that is presumably the small abscess.  She's had a really good response to IV abx.  At this point, I recommend continuing to treat her with abx x 4 total weeks.  Consider bactrim DS BID.  Follow up in my office in 1 week for clinical recheck.  Savannah Palmer. Michael Marquarius Lofton, MD [facility] [phone] 6:58 AM

## 2016-03-17 LAB — CULTURE, BLOOD (ROUTINE X 2)
Culture: NO GROWTH
Culture: NO GROWTH

## 2016-03-23 MED FILL — SUBOXONE 8 MG-2 MG SL FILM: 8-2 | 4 days supply | Qty: 9 | Fill #0 | Status: TO

## 2016-03-27 MED FILL — SUBOXONE 8 MG-2 MG SL FILM: 8-2 | 4 days supply | Qty: 9 | Fill #0

## 2016-03-30 MED FILL — ZUBSOLV 5.7-1.4 MG TAB SL: 5.7-1.4 | 1 days supply | Qty: 3 | Fill #1

## 2016-03-30 MED FILL — ZUBSOLV 5.7-1.4 MG TAB SL: 5.7-1.4 | 4 days supply | Qty: 13 | Fill #2

## 2016-03-30 MED FILL — ZUBSOLV 5.7-1.4 MG TAB SL: 5.7-1.4 | 1 days supply | Qty: 2 | Fill #0

## 2016-09-23 ENCOUNTER — Encounter (HOSPITAL_COMMUNITY): Payer: Self-pay

## 2016-09-23 ENCOUNTER — Emergency Department (HOSPITAL_COMMUNITY)
Admission: EM | Admit: 2016-09-23 | Discharge: 2016-09-23 | Disposition: A | Discharge status: Home / Self Care | Payer: Self-pay | Attending: Emergency Medicine | Admitting: Emergency Medicine

## 2016-09-23 DIAGNOSIS — F1910 Other psychoactive substance abuse, uncomplicated: Secondary | ICD-10-CM

## 2016-09-23 DIAGNOSIS — Z79899 Other long term (current) drug therapy: Secondary | ICD-10-CM | POA: Insufficient documentation

## 2016-09-23 DIAGNOSIS — F1110 Opioid abuse, uncomplicated: Secondary | ICD-10-CM | POA: Insufficient documentation

## 2016-09-23 DIAGNOSIS — F17200 Nicotine dependence, unspecified, uncomplicated: Secondary | ICD-10-CM | POA: Insufficient documentation

## 2016-09-23 DIAGNOSIS — F1410 Cocaine abuse, uncomplicated: Secondary | ICD-10-CM | POA: Insufficient documentation

## 2016-09-23 DIAGNOSIS — F1310 Sedative, hypnotic or anxiolytic abuse, uncomplicated: Secondary | ICD-10-CM | POA: Insufficient documentation

## 2016-09-23 LAB — RAPID URINE DRUG SCREEN, HOSP PERFORMED
Amphetamines: NOT DETECTED
Barbiturates: NOT DETECTED
Benzodiazepines: POSITIVE — AB
Cocaine: POSITIVE — AB
Opiates: POSITIVE — AB
Tetrahydrocannabinol: NOT DETECTED

## 2016-09-23 LAB — COMPREHENSIVE METABOLIC PANEL
ALK PHOS: 85 U/L (ref 38–126)
ALT: 25 U/L (ref 14–54)
AST: 23 U/L (ref 15–41)
Albumin: 4.1 g/dL (ref 3.5–5.0)
Anion gap: 11 (ref 5–15)
BUN: 6 mg/dL (ref 6–20)
CALCIUM: 9.6 mg/dL (ref 8.9–10.3)
CO2: 27 mmol/L (ref 22–32)
CREATININE: 0.63 mg/dL (ref 0.44–1.00)
Chloride: 104 mmol/L (ref 101–111)
Glucose, Bld: 117 mg/dL — ABNORMAL HIGH (ref 65–99)
Potassium: 3.8 mmol/L (ref 3.5–5.1)
Sodium: 142 mmol/L (ref 135–145)
Total Bilirubin: 0.4 mg/dL (ref 0.3–1.2)
Total Protein: 6.9 g/dL (ref 6.5–8.1)

## 2016-09-23 LAB — CBC
HCT: 35.7 % — ABNORMAL LOW (ref 36.0–46.0)
Hemoglobin: 11.7 g/dL — ABNORMAL LOW (ref 12.0–15.0)
MCH: 28.5 pg (ref 26.0–34.0)
MCHC: 32.8 g/dL (ref 30.0–36.0)
MCV: 86.9 fL (ref 78.0–100.0)
Platelets: 194 10*3/uL (ref 150–400)
RBC: 4.11 MIL/uL (ref 3.87–5.11)
RDW: 13.6 % (ref 11.5–15.5)
WBC: 7.1 10*3/uL (ref 4.0–10.5)

## 2016-09-23 LAB — ETHANOL: Alcohol, Ethyl (B): <5 mg/dL

## 2016-09-23 NOTE — ED Triage Notes (Signed)
Pt states here for rehab. Pt states uses heroin and crack-cocaine. Pt denies any etoh tonight, states crack tonight.

## 2016-09-23 NOTE — ED Provider Notes (Signed)
[facility]-EMERGENCY DEPT Provider Note   CSN: [id number] Arrival date & time: 09/23/16  0102  By signing my name below, I, Cynda Acres, attest that this documentation has been prepared under the direction and in the presence of Melene Plan, DO. Electronically Signed: Cynda Acres, Scribe. 09/23/16. 1:57 AM.  History   Chief Complaint Chief Complaint  Patient presents with  . Addiction Problem   HPI Comments: Savannah Palmer is a 26 y.o. female with a history of anxiety, depression, and polysubstance abuse, who presents to the Emergency Department complaining of  substance abuse that began earlier tonight. Patient reports taking both heroine and crack-cocaine at 12 AM tonight. Patient has no associated symptoms. No modifying factors indicated. Patient denies any alcohol use. Patient denies any fever, chest pain, shortness of breath, abdominal pain, or any other symptoms.   The history is provided by the patient. No language interpreter was used.  Illness  This is a new problem. The current episode started more than 1 week ago. The problem occurs constantly. The problem has not changed since onset.Pertinent negatives include no chest pain, no headaches and no shortness of breath. Nothing aggravates the symptoms. Nothing relieves the symptoms. She has tried nothing for the symptoms. The treatment provided no relief.    Past Medical History:  Diagnosis Date  . Anxiety   . Anxiety   . Depression     Patient Active Problem List   Diagnosis Date Noted  . Lung abscess (HCC) 03/13/2016  . Normocytic anemia 03/12/2016  . Cellulitis   . Polysubstance dependence including opioid drug with daily use (HCC) 01/19/2016  . Hepatitis C antibody test positive 01/17/2016  . AKI (acute kidney injury) (HCC) 01/13/2016  . IVDU (intravenous drug user) 01/13/2016  . Polysubstance (including opioids) dependence, daily use (HCC) 01/13/2016  . Murmur, heart 01/13/2016  . Altered mental status     Past  Surgical History:  Procedure Laterality Date  . CHOLECYSTECTOMY      OB History    No data available       Home Medications    Prior to Admission medications   Medication Sig Start Date End Date Taking? Authorizing Provider  amoxicillin-clavulanate (AUGMENTIN) 875-125 MG tablet Take 1 tablet by mouth 2 (two) times daily. 03/16/16   Leana Roe Elgergawy, MD  busPIRone (BUSPAR) 10 MG tablet Take 10 mg by mouth 2 (two) times daily. 02/15/16   Historical Provider, MD  citalopram (CELEXA) 10 MG tablet Take 1 tablet (10 mg total) by mouth daily. 01/23/16   Oneta Rack, NP  clonazePAM (KLONOPIN) 0.5 MG tablet Take 0.5 tablets (0.25 mg total) by mouth daily. Patient taking differently: Take 0.5 mg by mouth daily.  01/24/16   Oneta Rack, NP  gabapentin (NEURONTIN) 300 MG capsule Take 1 capsule (300 mg total) by mouth 3 (three) times daily. 01/23/16   Oneta Rack, NP  nicotine polacrilex (NICORETTE) 2 MG gum Take 1 each (2 mg total) by mouth as needed for smoking cessation. Patient not taking: Reported on 03/12/2016 01/23/16   Oneta Rack, NP  saccharomyces boulardii (FLORASTOR) 250 MG capsule Take 1 capsule (250 mg total) by mouth 2 (two) times daily. 03/16/16   Leana Roe Elgergawy, MD  SUBOXONE 8-2 MG FILM Take 2.5 each by mouth daily. 02/28/16   Historical Provider, MD    Family History Family History  Problem Relation Age of Onset  . Tuberculosis Neg Hx   . Diabetes Mellitus II Neg Hx  Social History Social History  Substance Use Topics  . Smoking status: Current Some Day Smoker    Packs/day: 1.00  . Smokeless tobacco: Never Used  . Alcohol use Yes     Comment: occasinally socially     Allergies   Patient has no known allergies.   Review of Systems Review of Systems  Constitutional: Negative for chills and fever.  HENT: Negative for congestion and rhinorrhea.   Eyes: Negative for redness and visual disturbance.  Respiratory: Negative for shortness of breath and  wheezing.   Cardiovascular: Negative for chest pain and palpitations.  Gastrointestinal: Negative for nausea and vomiting.  Genitourinary: Negative for dysuria and urgency.  Musculoskeletal: Negative for arthralgias and myalgias.  Skin: Negative for pallor and wound.  Neurological: Negative for dizziness and headaches.     Physical Exam Updated Vital Signs BP 115/77   Pulse 87   Temp 97.9 F (36.6 C) (Oral)   Resp 16   SpO2 94%   Physical Exam  Constitutional: She is oriented to person, place, and time. She appears well-developed and well-nourished. No distress.  HENT:  Head: Normocephalic and atraumatic.  Eyes: EOM are normal. Pupils are equal, round, and reactive to light.  Neck: Normal range of motion. Neck supple.  Cardiovascular: Normal rate and regular rhythm.  Exam reveals no gallop and no friction rub.   No murmur heard. Pulmonary/Chest: Effort normal. She has no wheezes. She has no rales.  Abdominal: Soft. She exhibits no distension. There is no tenderness.  Musculoskeletal: She exhibits no edema or tenderness.  Track marks on the bilateral arms.   Neurological: She is alert and oriented to person, place, and time.  Skin: Skin is warm and dry. She is not diaphoretic.  Psychiatric: She has a normal mood and affect. Her behavior is normal.  Nursing note and vitals reviewed.    ED Treatments / Results  DIAGNOSTIC STUDIES: Oxygen Saturation is 100% on RA, normal by my interpretation.    COORDINATION OF CARE: 1:56 AM Discussed treatment plan with pt at bedside and pt agreed to plan.   Labs (all labs ordered are listed, but only abnormal results are displayed) Labs Reviewed  COMPREHENSIVE METABOLIC PANEL - Abnormal; Notable for the following:       Result Value   Glucose, Bld 117 (*)    All other components within normal limits  CBC - Abnormal; Notable for the following:    Hemoglobin 11.7 (*)    HCT 35.7 (*)    All other components within normal limits    RAPID URINE DRUG SCREEN, HOSP PERFORMED - Abnormal; Notable for the following:    Opiates POSITIVE (*)    Cocaine POSITIVE (*)    Benzodiazepines POSITIVE (*)    All other components within normal limits  ETHANOL    EKG  EKG Interpretation None       Radiology No results found.  Procedures Procedures (including critical care time)  Medications Ordered in ED Medications - No data to display   Initial Impression / Assessment and Plan / ED Course  I have reviewed the triage vital signs and the nursing notes.  Pertinent labs & imaging results that were available during my care of the patient were reviewed by me and considered in my medical decision making (see chart for details).     26 yo F With a chief complaint of polysubstance abuse. Patient is looking to go into rehabilitation. I discussed that this is not offered in the hospital. Given  a list of outpatient resources. Labs ordered in triage and unremarkable.  2:23 AM:  I have discussed the diagnosis/risks/treatment options with the patient and family and believe the pt to be eligible for discharge home to follow-up with PCP. We also discussed returning to the ED immediately if new or worsening sx occur. We discussed the sx which are most concerning (e.g., sudden worsening pain, fever, inability to tolerate by mouth) that necessitate immediate return. Medications administered to the patient during their visit and any new prescriptions provided to the patient are listed below.  Medications given during this visit Medications - No data to display   The patient appears reasonably screen and/or stabilized for discharge and I doubt any other medical condition or other [facility] requiring further screening, evaluation, or treatment in the ED at this time prior to discharge.    Final Clinical Impressions(s) / ED Diagnoses   Final diagnoses:  Polysubstance abuse    New Prescriptions Discharge Medication List as of 09/23/2016  1:58  AM     I personally performed the services described in this documentation, which was scribed in my presence. The recorded information has been reviewed and is accurate.      Melene Plan, DO 09/23/16 [phone]

## 2016-11-10 ENCOUNTER — Inpatient Hospital Stay (HOSPITAL_COMMUNITY): Payer: BLUE CROSS/BLUE SHIELD

## 2016-11-10 ENCOUNTER — Emergency Department (HOSPITAL_COMMUNITY): Payer: BLUE CROSS/BLUE SHIELD

## 2016-11-10 ENCOUNTER — Inpatient Hospital Stay (HOSPITAL_COMMUNITY)
Admission: EM | Admit: 2016-11-10 | Discharge: 2016-12-30 | DRG: 871 | Disposition: A | Discharge status: Home / Self Care | Payer: BLUE CROSS/BLUE SHIELD | Attending: Internal Medicine | Admitting: Internal Medicine

## 2016-11-10 ENCOUNTER — Encounter (HOSPITAL_COMMUNITY): Payer: Self-pay

## 2016-11-10 DIAGNOSIS — B9689 Other specified bacterial agents as the cause of diseases classified elsewhere: Secondary | ICD-10-CM | POA: Diagnosis not present

## 2016-11-10 DIAGNOSIS — B9562 Methicillin resistant Staphylococcus aureus infection as the cause of diseases classified elsewhere: Secondary | ICD-10-CM | POA: Diagnosis present

## 2016-11-10 DIAGNOSIS — J9690 Respiratory failure, unspecified, unspecified whether with hypoxia or hypercapnia: Secondary | ICD-10-CM

## 2016-11-10 DIAGNOSIS — R188 Other ascites: Secondary | ICD-10-CM | POA: Diagnosis present

## 2016-11-10 DIAGNOSIS — Z9889 Other specified postprocedural states: Secondary | ICD-10-CM

## 2016-11-10 DIAGNOSIS — F17210 Nicotine dependence, cigarettes, uncomplicated: Secondary | ICD-10-CM | POA: Diagnosis present

## 2016-11-10 DIAGNOSIS — J869 Pyothorax without fistula: Secondary | ICD-10-CM | POA: Diagnosis not present

## 2016-11-10 DIAGNOSIS — F1920 Other psychoactive substance dependence, uncomplicated: Secondary | ICD-10-CM | POA: Diagnosis present

## 2016-11-10 DIAGNOSIS — Z09 Encounter for follow-up examination after completed treatment for conditions other than malignant neoplasm: Secondary | ICD-10-CM

## 2016-11-10 DIAGNOSIS — F1120 Opioid dependence, uncomplicated: Secondary | ICD-10-CM | POA: Diagnosis present

## 2016-11-10 DIAGNOSIS — I368 Other nonrheumatic tricuspid valve disorders: Secondary | ICD-10-CM | POA: Diagnosis present

## 2016-11-10 DIAGNOSIS — E876 Hypokalemia: Secondary | ICD-10-CM

## 2016-11-10 DIAGNOSIS — I330 Acute and subacute infective endocarditis: Secondary | ICD-10-CM | POA: Diagnosis present

## 2016-11-10 DIAGNOSIS — Z781 Physical restraint status: Secondary | ICD-10-CM | POA: Diagnosis not present

## 2016-11-10 DIAGNOSIS — A419 Sepsis, unspecified organism: Secondary | ICD-10-CM | POA: Diagnosis not present

## 2016-11-10 DIAGNOSIS — Z452 Encounter for adjustment and management of vascular access device: Secondary | ICD-10-CM

## 2016-11-10 DIAGNOSIS — F1420 Cocaine dependence, uncomplicated: Secondary | ICD-10-CM | POA: Diagnosis present

## 2016-11-10 DIAGNOSIS — T39395A Adverse effect of other nonsteroidal anti-inflammatory drugs [NSAID], initial encounter: Secondary | ICD-10-CM | POA: Diagnosis present

## 2016-11-10 DIAGNOSIS — I38 Endocarditis, valve unspecified: Secondary | ICD-10-CM | POA: Diagnosis present

## 2016-11-10 DIAGNOSIS — Z9689 Presence of other specified functional implants: Secondary | ICD-10-CM | POA: Diagnosis not present

## 2016-11-10 DIAGNOSIS — F419 Anxiety disorder, unspecified: Secondary | ICD-10-CM | POA: Diagnosis present

## 2016-11-10 DIAGNOSIS — N179 Acute kidney failure, unspecified: Secondary | ICD-10-CM | POA: Diagnosis present

## 2016-11-10 DIAGNOSIS — R7881 Bacteremia: Secondary | ICD-10-CM

## 2016-11-10 DIAGNOSIS — J984 Other disorders of lung: Secondary | ICD-10-CM | POA: Diagnosis not present

## 2016-11-10 DIAGNOSIS — R6521 Severe sepsis with septic shock: Secondary | ICD-10-CM | POA: Diagnosis not present

## 2016-11-10 DIAGNOSIS — J9601 Acute respiratory failure with hypoxia: Secondary | ICD-10-CM | POA: Diagnosis not present

## 2016-11-10 DIAGNOSIS — M25551 Pain in right hip: Secondary | ICD-10-CM | POA: Diagnosis not present

## 2016-11-10 DIAGNOSIS — A4102 Sepsis due to Methicillin resistant Staphylococcus aureus: Principal | ICD-10-CM | POA: Diagnosis present

## 2016-11-10 DIAGNOSIS — I2690 Septic pulmonary embolism without acute cor pulmonale: Secondary | ICD-10-CM | POA: Diagnosis present

## 2016-11-10 DIAGNOSIS — I071 Rheumatic tricuspid insufficiency: Secondary | ICD-10-CM | POA: Diagnosis not present

## 2016-11-10 DIAGNOSIS — J189 Pneumonia, unspecified organism: Secondary | ICD-10-CM | POA: Diagnosis present

## 2016-11-10 DIAGNOSIS — E860 Dehydration: Secondary | ICD-10-CM | POA: Diagnosis present

## 2016-11-10 DIAGNOSIS — B999 Unspecified infectious disease: Secondary | ICD-10-CM | POA: Diagnosis present

## 2016-11-10 DIAGNOSIS — I76 Septic arterial embolism: Secondary | ICD-10-CM | POA: Diagnosis present

## 2016-11-10 DIAGNOSIS — R0789 Other chest pain: Secondary | ICD-10-CM | POA: Diagnosis present

## 2016-11-10 DIAGNOSIS — D649 Anemia, unspecified: Secondary | ICD-10-CM | POA: Diagnosis present

## 2016-11-10 DIAGNOSIS — J90 Pleural effusion, not elsewhere classified: Secondary | ICD-10-CM | POA: Diagnosis not present

## 2016-11-10 DIAGNOSIS — J939 Pneumothorax, unspecified: Secondary | ICD-10-CM | POA: Diagnosis not present

## 2016-11-10 DIAGNOSIS — F1990 Other psychoactive substance use, unspecified, uncomplicated: Secondary | ICD-10-CM | POA: Diagnosis present

## 2016-11-10 DIAGNOSIS — J95811 Postprocedural pneumothorax: Secondary | ICD-10-CM | POA: Diagnosis not present

## 2016-11-10 DIAGNOSIS — D696 Thrombocytopenia, unspecified: Secondary | ICD-10-CM

## 2016-11-10 DIAGNOSIS — Z4682 Encounter for fitting and adjustment of non-vascular catheter: Secondary | ICD-10-CM

## 2016-11-10 DIAGNOSIS — R1011 Right upper quadrant pain: Secondary | ICD-10-CM

## 2016-11-10 DIAGNOSIS — R161 Splenomegaly, not elsewhere classified: Secondary | ICD-10-CM | POA: Diagnosis present

## 2016-11-10 DIAGNOSIS — F329 Major depressive disorder, single episode, unspecified: Secondary | ICD-10-CM | POA: Diagnosis present

## 2016-11-10 HISTORY — DX: Rheumatic tricuspid insufficiency: I07.1

## 2016-11-10 HISTORY — DX: Unspecified infectious disease: B99.9

## 2016-11-10 HISTORY — DX: Other psychoactive substance use, unspecified, uncomplicated: F19.90

## 2016-11-10 HISTORY — DX: Septic pulmonary embolism without acute cor pulmonale: I26.90

## 2016-11-10 HISTORY — DX: Cardiac murmur, unspecified: R01.1

## 2016-11-10 HISTORY — DX: Nicotine dependence, unspecified, uncomplicated: F17.200

## 2016-11-10 HISTORY — DX: Acute and subacute infective endocarditis: I33.0

## 2016-11-10 LAB — CBC
HCT: 32.5 % — ABNORMAL LOW (ref 36.0–46.0)
HCT: 28.2 % — ABNORMAL LOW (ref 36.0–46.0)
HEMOGLOBIN: 9.6 g/dL — AB (ref 12.0–15.0)
Hemoglobin: 11.2 g/dL — ABNORMAL LOW (ref 12.0–15.0)
MCH: 28.5 pg (ref 26.0–34.0)
MCH: 28.3 pg (ref 26.0–34.0)
MCHC: 34.5 g/dL (ref 30.0–36.0)
MCHC: 34 g/dL (ref 30.0–36.0)
MCV: 82.7 fL (ref 78.0–100.0)
MCV: 83.2 fL (ref 78.0–100.0)
PLATELETS: 104 10*3/uL — AB (ref 150–400)
Platelets: 119 10*3/uL — ABNORMAL LOW (ref 150–400)
RBC: 3.93 MIL/uL (ref 3.87–5.11)
RBC: 3.39 MIL/uL — ABNORMAL LOW (ref 3.87–5.11)
RDW: 14.3 % (ref 11.5–15.5)
RDW: 14.6 % (ref 11.5–15.5)
WBC: 14.5 10*3/uL — ABNORMAL HIGH (ref 4.0–10.5)
WBC: 12.8 10*3/uL — AB (ref 4.0–10.5)

## 2016-11-10 LAB — HEPATIC FUNCTION PANEL
ALBUMIN: 2.8 g/dL — AB (ref 3.5–5.0)
ALT: 20 U/L (ref 14–54)
AST: 38 U/L (ref 15–41)
Alkaline Phosphatase: 249 U/L — ABNORMAL HIGH (ref 38–126)
BILIRUBIN TOTAL: 1 mg/dL (ref 0.3–1.2)
Bilirubin, Direct: 0.5 mg/dL (ref 0.1–0.5)
Indirect Bilirubin: 0.5 mg/dL (ref 0.3–0.9)
Total Protein: 6.6 g/dL (ref 6.5–8.1)

## 2016-11-10 LAB — URINALYSIS, ROUTINE W REFLEX MICROSCOPIC
Bilirubin Urine: NEGATIVE
Glucose, UA: 50 mg/dL — AB
Glucose, UA: NEGATIVE mg/dL
KETONES UR: NEGATIVE mg/dL
Ketones, ur: NEGATIVE mg/dL
NITRITE: NEGATIVE
Nitrite: NEGATIVE
PROTEIN: 100 mg/dL — AB
PROTEIN: NEGATIVE mg/dL
Specific Gravity, Urine: 1.017 (ref 1.005–1.030)
Specific Gravity, Urine: 1.006 (ref 1.005–1.030)
pH: 5 (ref 5.0–8.0)
pH: 5 (ref 5.0–8.0)

## 2016-11-10 LAB — COMPREHENSIVE METABOLIC PANEL
ALBUMIN: 2.1 g/dL — AB (ref 3.5–5.0)
ALK PHOS: 164 U/L — AB (ref 38–126)
ALT: 16 U/L (ref 14–54)
AST: 25 U/L (ref 15–41)
Anion gap: 7 (ref 5–15)
BUN: 29 mg/dL — AB (ref 6–20)
CALCIUM: 7.4 mg/dL — AB (ref 8.9–10.3)
CHLORIDE: 107 mmol/L (ref 101–111)
CO2: 23 mmol/L (ref 22–32)
Creatinine, Ser: 1.04 mg/dL — ABNORMAL HIGH (ref 0.44–1.00)
GFR calc Af Amer: >60 mL/min (ref >60)
GFR calc non Af Amer: >60 mL/min (ref >60)
GLUCOSE: 128 mg/dL — AB (ref 65–99)
Potassium: 3.1 mmol/L — ABNORMAL LOW (ref 3.5–5.1)
SODIUM: 137 mmol/L (ref 135–145)
Total Bilirubin: 0.9 mg/dL (ref 0.3–1.2)
Total Protein: 5 g/dL — ABNORMAL LOW (ref 6.5–8.1)

## 2016-11-10 LAB — BASIC METABOLIC PANEL
Anion gap: 14 (ref 5–15)
BUN: 34 mg/dL — AB (ref 6–20)
CHLORIDE: 91 mmol/L — AB (ref 101–111)
CO2: 24 mmol/L (ref 22–32)
CREATININE: 1.36 mg/dL — AB (ref 0.44–1.00)
Calcium: 8.5 mg/dL — ABNORMAL LOW (ref 8.9–10.3)
GFR calc Af Amer: >60 mL/min (ref >60)
GFR calc non Af Amer: 53 mL/min — ABNORMAL LOW (ref >60)
GLUCOSE: 127 mg/dL — AB (ref 65–99)
Potassium: 3.2 mmol/L — ABNORMAL LOW (ref 3.5–5.1)
Sodium: 129 mmol/L — ABNORMAL LOW (ref 135–145)

## 2016-11-10 LAB — TYPE AND SCREEN
ABO/RH(D): O POS
Antibody Screen: NEGATIVE

## 2016-11-10 LAB — PROTIME-INR
INR: 1.16
Prothrombin Time: 14.9 seconds (ref 11.4–15.2)

## 2016-11-10 LAB — BLOOD GAS, ARTERIAL
Acid-base deficit: 0.5 mmol/L (ref 0.0–2.0)
Bicarbonate: 23.1 mmol/L (ref 20.0–28.0)
Drawn by: 331471
O2 SAT: 93.1 %
PCO2 ART: 35.8 mmHg (ref 32.0–48.0)
PH ART: 7.426 (ref 7.350–7.450)
PO2 ART: 66.6 mmHg — AB (ref 83.0–108.0)
Patient temperature: 98.6

## 2016-11-10 LAB — DIFFERENTIAL
BASOS PCT: 0 %
Basophils Absolute: 0 10*3/uL (ref 0.0–0.1)
EOS ABS: 0 10*3/uL (ref 0.0–0.7)
EOS PCT: 0 %
LYMPHS PCT: 7 %
Lymphs Abs: 1 10*3/uL (ref 0.7–4.0)
Monocytes Absolute: 1.2 10*3/uL — ABNORMAL HIGH (ref 0.1–1.0)
Monocytes Relative: 8 %
Neutro Abs: 12.3 10*3/uL — ABNORMAL HIGH (ref 1.7–7.7)
Neutrophils Relative %: 85 %

## 2016-11-10 LAB — POCT I-STAT TROPONIN I: Troponin i, poc: 0 ng/mL (ref 0.00–0.08)

## 2016-11-10 LAB — GLUCOSE, CAPILLARY: Glucose-Capillary: 119 mg/dL — ABNORMAL HIGH (ref 65–99)

## 2016-11-10 LAB — PREGNANCY, URINE: Preg Test, Ur: NEGATIVE

## 2016-11-10 LAB — PROCALCITONIN: PROCALCITONIN: 3.16 ng/mL

## 2016-11-10 LAB — APTT: aPTT: 29 seconds (ref 24–36)

## 2016-11-10 LAB — CG4 I-STAT (LACTIC ACID)
Lactic Acid, Venous: 3.26 mmol/L (ref 0.5–1.9)
Lactic Acid, Venous: 2.11 mmol/L (ref 0.5–1.9)

## 2016-11-10 LAB — MRSA PCR SCREENING: MRSA by PCR: POSITIVE — AB

## 2016-11-10 LAB — MAGNESIUM: Magnesium: 1.5 mg/dL — ABNORMAL LOW (ref 1.7–2.4)

## 2016-11-10 LAB — TROPONIN I: TROPONIN I: 0.05 ng/mL — AB (ref <0.03)

## 2016-11-10 LAB — LACTIC ACID, PLASMA: Lactic Acid, Venous: 1.9 mmol/L (ref 0.5–1.9)

## 2016-11-10 MED ORDER — FENTANYL CITRATE (PF) 100 MCG/2ML IJ SOLN
25.0000 ug | Freq: Once | INTRAMUSCULAR | Status: AC
  Administered 2016-11-10: 25 ug via INTRAVENOUS

## 2016-11-10 MED ORDER — PIPERACILLIN-TAZOBACTAM 3.375 G IVPB 30 MIN
3.3750 g | Freq: Once | INTRAVENOUS | Status: AC
  Administered 2016-11-10: 3.375 g via INTRAVENOUS
  Filled 2016-11-10: qty 50

## 2016-11-10 MED ORDER — CITALOPRAM HYDROBROMIDE 20 MG PO TABS
10.0000 mg | ORAL_TABLET | Freq: Every day | ORAL | Status: DC
  Administered 2016-11-10 – 2016-11-25 (×16): 10 mg via ORAL
  Filled 2016-11-10 (×16): qty 1

## 2016-11-10 MED ORDER — NICOTINE POLACRILEX 2 MG MT GUM
2.0000 mg | CHEWING_GUM | OROMUCOSAL | Status: DC | PRN
  Filled 2016-11-10: qty 1

## 2016-11-10 MED ORDER — DEXTROSE 5 % IV SOLN
1.0000 g | Freq: Two times a day (BID) | INTRAVENOUS | Status: DC
  Administered 2016-11-10: 1 g via INTRAVENOUS
  Filled 2016-11-10 (×2): qty 1

## 2016-11-10 MED ORDER — SODIUM CHLORIDE 0.9 % IV BOLUS (SEPSIS): 500.0000 mL | Freq: Once | INTRAVENOUS | Status: DC

## 2016-11-10 MED ORDER — SODIUM CHLORIDE 0.9 % IV SOLN: INTRAVENOUS | Status: DC

## 2016-11-10 MED ORDER — DOCUSATE SODIUM 50 MG/5ML PO LIQD
100.0000 mg | Freq: Two times a day (BID) | ORAL | Status: DC | PRN
  Filled 2016-11-10: qty 10

## 2016-11-10 MED ORDER — NOREPINEPHRINE 4 MG/250ML-% IV SOLN
5.0000 ug/min | INTRAVENOUS | Status: DC
  Administered 2016-11-10: 5 ug/min via INTRAVENOUS
  Filled 2016-11-10: qty 250

## 2016-11-10 MED ORDER — SODIUM CHLORIDE 0.9 % IV SOLN
250.0000 mL | INTRAVENOUS | Status: DC | PRN
  Administered 2016-11-15 – 2016-12-20 (×5): 250 mL via INTRAVENOUS

## 2016-11-10 MED ORDER — SODIUM CHLORIDE 0.9 % IV SOLN: 500.0000 mL | INTRAVENOUS | Status: DC | PRN

## 2016-11-10 MED ORDER — ORAL CARE MOUTH RINSE
15.0000 mL | Freq: Two times a day (BID) | OROMUCOSAL | Status: DC
  Administered 2016-11-11: 15 mL via OROMUCOSAL

## 2016-11-10 MED ORDER — BUSPIRONE HCL 5 MG PO TABS
10.0000 mg | ORAL_TABLET | Freq: Two times a day (BID) | ORAL | Status: DC
  Administered 2016-11-10 – 2016-11-25 (×31): 10 mg via ORAL
  Filled 2016-11-10: qty 2
  Filled 2016-11-10: qty 1
  Filled 2016-11-10 (×2): qty 2
  Filled 2016-11-10 (×2): qty 1
  Filled 2016-11-10 (×3): qty 2
  Filled 2016-11-10 (×2): qty 1
  Filled 2016-11-10: qty 2
  Filled 2016-11-10 (×2): qty 1
  Filled 2016-11-10 (×4): qty 2
  Filled 2016-11-10: qty 1
  Filled 2016-11-10: qty 2
  Filled 2016-11-10 (×2): qty 1
  Filled 2016-11-10 (×4): qty 2
  Filled 2016-11-10 (×2): qty 1
  Filled 2016-11-10: qty 2
  Filled 2016-11-10 (×2): qty 1

## 2016-11-10 MED ORDER — INSULIN ASPART 100 UNIT/ML ~~LOC~~ SOLN
2.0000 [IU] | SUBCUTANEOUS | Status: DC
  Administered 2016-11-11: 2 [IU] via SUBCUTANEOUS
  Administered 2016-11-11: 4 [IU] via SUBCUTANEOUS
  Administered 2016-11-11 – 2016-11-12 (×4): 2 [IU] via SUBCUTANEOUS

## 2016-11-10 MED ORDER — SODIUM CHLORIDE 0.9 % IV SOLN
0.0300 [IU]/min | INTRAVENOUS | Status: DC
  Administered 2016-11-10: 0.03 [IU]/min via INTRAVENOUS
  Filled 2016-11-10 (×2): qty 2

## 2016-11-10 MED ORDER — FENTANYL CITRATE (PF) 100 MCG/2ML IJ SOLN
INTRAMUSCULAR | Status: AC
  Filled 2016-11-10: qty 2

## 2016-11-10 MED ORDER — SODIUM CHLORIDE 0.9 % IV BOLUS (SEPSIS): 1000.0000 mL | Freq: Once | INTRAVENOUS | Status: AC

## 2016-11-10 MED ORDER — VANCOMYCIN HCL 500 MG IV SOLR
500.0000 mg | Freq: Two times a day (BID) | INTRAVENOUS | Status: DC
  Administered 2016-11-11: 500 mg via INTRAVENOUS
  Filled 2016-11-10: qty 500

## 2016-11-10 MED ORDER — NALOXONE HCL 2 MG/2ML IJ SOSY
1.0000 mg | PREFILLED_SYRINGE | Freq: Once | INTRAMUSCULAR | Status: AC
  Administered 2016-11-10: 1 mg via INTRAVENOUS
  Filled 2016-11-10: qty 2

## 2016-11-10 MED ORDER — ALBUTEROL SULFATE (2.5 MG/3ML) 0.083% IN NEBU: 2.5000 mg | INHALATION_SOLUTION | RESPIRATORY_TRACT | Status: DC | PRN

## 2016-11-10 MED ORDER — HYDROCORTISONE NA SUCCINATE PF 100 MG IJ SOLR
50.0000 mg | Freq: Four times a day (QID) | INTRAMUSCULAR | Status: DC
  Administered 2016-11-10 – 2016-11-12 (×7): 50 mg via INTRAVENOUS
  Administered 2016-11-12: 20:00:00 via INTRAVENOUS
  Administered 2016-11-12 – 2016-11-13 (×3): 50 mg via INTRAVENOUS
  Filled 2016-11-10 (×2): qty 2
  Filled 2016-11-10: qty 1
  Filled 2016-11-10 (×9): qty 2

## 2016-11-10 MED ORDER — BUPRENORPHINE HCL-NALOXONE HCL 8-2 MG SL SUBL
2.0000 | SUBLINGUAL_TABLET | Freq: Every day | SUBLINGUAL | Status: DC
  Administered 2016-11-10 – 2016-12-30 (×50): 2 via SUBLINGUAL
  Filled 2016-11-10 (×51): qty 2

## 2016-11-10 MED ORDER — MAGNESIUM SULFATE 50 % IJ SOLN: 1.0000 g | Freq: Once | INTRAMUSCULAR | Status: DC

## 2016-11-10 MED ORDER — VANCOMYCIN HCL IN DEXTROSE 1-5 GM/200ML-% IV SOLN: 1000.0000 mg | Freq: Once | INTRAVENOUS | Status: DC

## 2016-11-10 MED ORDER — VANCOMYCIN HCL IN DEXTROSE 1-5 GM/200ML-% IV SOLN
1000.0000 mg | Freq: Once | INTRAVENOUS | Status: AC
  Administered 2016-11-10: 1000 mg via INTRAVENOUS
  Filled 2016-11-10: qty 200

## 2016-11-10 MED ORDER — MAGNESIUM SULFATE IN D5W 1-5 GM/100ML-% IV SOLN
1.0000 g | Freq: Once | INTRAVENOUS | Status: AC
  Administered 2016-11-10: 1 g via INTRAVENOUS
  Filled 2016-11-10 (×2): qty 100

## 2016-11-10 MED ORDER — POTASSIUM CHLORIDE IN NACL 40-0.9 MEQ/L-% IV SOLN
INTRAVENOUS | Status: DC
  Administered 2016-11-10 – 2016-11-14 (×6): 100 mL/h via INTRAVENOUS
  Filled 2016-11-10 (×11): qty 1000

## 2016-11-10 MED ORDER — SODIUM CHLORIDE 0.9 % IV BOLUS (SEPSIS)
2000.0000 mL | Freq: Once | INTRAVENOUS | Status: AC
  Administered 2016-11-10: 1000 mL via INTRAVENOUS

## 2016-11-10 MED ORDER — SODIUM CHLORIDE 0.9 % IV BOLUS (SEPSIS): 250.0000 mL | Freq: Once | INTRAVENOUS | Status: DC

## 2016-11-10 MED ORDER — PHENYLEPHRINE HCL-NACL 10-0.9 MG/250ML-% IV SOLN
0.0000 ug/min | INTRAVENOUS | Status: DC
  Administered 2016-11-10: 20 ug/min via INTRAVENOUS
  Administered 2016-11-10: 60 ug/min via INTRAVENOUS
  Administered 2016-11-10: 40 ug/min via INTRAVENOUS
  Administered 2016-11-10: 30 ug/min via INTRAVENOUS
  Administered 2016-11-10: 80 ug/min via INTRAVENOUS
  Filled 2016-11-10 (×3): qty 250

## 2016-11-10 MED ORDER — ACETAMINOPHEN 325 MG PO TABS
650.0000 mg | ORAL_TABLET | ORAL | Status: DC | PRN
  Administered 2016-11-13 – 2016-11-15 (×6): 650 mg via ORAL
  Filled 2016-11-10 (×6): qty 2

## 2016-11-10 MED ORDER — SODIUM CHLORIDE 0.9 % IV BOLUS (SEPSIS): 1000.0000 mL | Freq: Once | INTRAVENOUS | Status: DC

## 2016-11-10 MED ORDER — CHLORHEXIDINE GLUCONATE 0.12 % MT SOLN
15.0000 mL | Freq: Two times a day (BID) | OROMUCOSAL | Status: DC
  Administered 2016-11-10 – 2016-11-11 (×3): 15 mL via OROMUCOSAL
  Filled 2016-11-10 (×2): qty 15

## 2016-11-10 MED ORDER — PHENYLEPHRINE HCL 10 MG/ML IJ SOLN
0.0000 ug/min | Freq: Once | INTRAVENOUS | Status: DC
  Filled 2016-11-10: qty 1

## 2016-11-10 MED ORDER — ONDANSETRON HCL 4 MG/2ML IJ SOLN: 4.0000 mg | Freq: Four times a day (QID) | INTRAMUSCULAR | Status: DC | PRN

## 2016-11-10 MED ORDER — HEPARIN SODIUM (PORCINE) 5000 UNIT/ML IJ SOLN
5000.0000 [IU] | Freq: Three times a day (TID) | INTRAMUSCULAR | Status: DC
  Administered 2016-11-10 – 2016-12-21 (×112): 5000 [IU] via SUBCUTANEOUS
  Filled 2016-11-10 (×119): qty 1

## 2016-11-10 MED ORDER — GABAPENTIN 300 MG PO CAPS
300.0000 mg | ORAL_CAPSULE | Freq: Three times a day (TID) | ORAL | Status: DC
  Administered 2016-11-10 – 2016-12-05 (×74): 300 mg via ORAL
  Filled 2016-11-10 (×74): qty 1

## 2016-11-10 MED ORDER — THIAMINE HCL 100 MG/ML IJ SOLN
100.0000 mg | Freq: Once | INTRAMUSCULAR | Status: AC
  Administered 2016-11-10: 100 mg via INTRAVENOUS
  Filled 2016-11-10: qty 2

## 2016-11-10 MED ORDER — FAMOTIDINE IN NACL 20-0.9 MG/50ML-% IV SOLN
20.0000 mg | Freq: Two times a day (BID) | INTRAVENOUS | Status: DC
  Administered 2016-11-10 – 2016-11-13 (×6): 20 mg via INTRAVENOUS
  Filled 2016-11-10 (×6): qty 50

## 2016-11-10 MED ORDER — DEXTROSE 5 % IV SOLN: 2.0000 g | Freq: Once | INTRAVENOUS | Status: DC

## 2016-11-10 MED ORDER — SODIUM CHLORIDE 0.9 % IV BOLUS (SEPSIS)
1000.0000 mL | Freq: Once | INTRAVENOUS | Status: AC
  Administered 2016-11-10: 1000 mL via INTRAVENOUS

## 2016-11-10 MED ORDER — ACETAMINOPHEN 325 MG PO TABS
650.0000 mg | ORAL_TABLET | Freq: Once | ORAL | Status: AC
  Administered 2016-11-10: 650 mg via ORAL
  Filled 2016-11-10: qty 2

## 2016-11-10 MED ORDER — POTASSIUM CHLORIDE CRYS ER 20 MEQ PO TBCR
40.0000 meq | EXTENDED_RELEASE_TABLET | Freq: Once | ORAL | Status: AC
  Administered 2016-11-10: 40 meq via ORAL
  Filled 2016-11-10: qty 2

## 2016-11-10 NOTE — H&P (Signed)
Savannah Palmer is an 26 y.o. female.   Chief Complaint: weakness HPI:   Poor historian Comes with weakness. Admits to doing crack and heroin.  Savannah Palmer is a 26 y.o. female with a history of anxiety, depression, and polysubstance abuse, who presents to the Emergency Department complaining of  substance abuse that began earlier tonight. Patient reports taking both heroine and crack-cocaine at 12 AM tonight. Patient has no associated symptoms. No modifying factors indicated. Patient denies any alcohol use. Patient denies any fever, chest pain, shortness of breath, abdominal pain, or any other symptoms.   The history is provided by the patient. No language interpreter was used.   Illness  This is a new problem. The current episode started more than 1 week ago. The problem occurs constantly. The problem has not changed since onset.Pertinent negatives include no chest pain, no headaches and no shortness of breath. Nothing aggravates the symptoms. Nothing relieves the symptoms. She has tried nothing for the symptoms. The treatment provided no relief.   Past Medical History:  Diagnosis Date  . Anxiety   . Anxiety   . Depression     Past Surgical History:  Procedure Laterality Date  . CHOLECYSTECTOMY      Family History  Problem Relation Age of Onset  . Tuberculosis Neg Hx   . Diabetes Mellitus II Neg Hx    Social History:  reports that she has been smoking.  She has been smoking about 1.00 pack per day. She has never used smokeless tobacco. She reports that she drinks alcohol. She reports that she uses drugs, including Methamphetamines and Cocaine.  Allergies: No Known Allergies   (Not in a hospital admission)  Results for orders placed or performed during the hospital encounter of 11/10/16 (from the past 48 hour(s))  Basic metabolic panel     Status: Abnormal   Collection Time: 11/10/16  1:42 PM  Result Value Ref Range   Sodium 129 (L) 135 - 145 mmol/L   Potassium 3.2  (L) 3.5 - 5.1 mmol/L   Chloride 91 (L) 101 - 111 mmol/L   CO2 24 22 - 32 mmol/L   Glucose, Bld 127 (H) 65 - 99 mg/dL   BUN 34 (H) 6 - 20 mg/dL   Creatinine, Ser 1.36 (H) 0.44 - 1.00 mg/dL   Calcium 8.5 (L) 8.9 - 10.3 mg/dL   GFR calc non Af Amer 53 (L) >60 mL/min   GFR calc Af Amer >60 >60 mL/min    Comment: (NOTE) The eGFR has been calculated using the CKD EPI equation. This calculation has not been validated in all clinical situations. eGFR's persistently <60 mL/min signify possible Chronic Kidney Disease.    Anion gap 14 5 - 15  CBC     Status: Abnormal   Collection Time: 11/10/16  1:42 PM  Result Value Ref Range   WBC 14.5 (H) 4.0 - 10.5 K/uL   RBC 3.93 3.87 - 5.11 MIL/uL   Hemoglobin 11.2 (L) 12.0 - 15.0 g/dL   HCT 32.5 (L) 36.0 - 46.0 %   MCV 82.7 78.0 - 100.0 fL   MCH 28.5 26.0 - 34.0 pg   MCHC 34.5 30.0 - 36.0 g/dL   RDW 14.3 11.5 - 15.5 %   Platelets 119 (L) 150 - 400 K/uL    Comment: RESULT REPEATED AND VERIFIED SPECIMEN CHECKED FOR CLOTS PLATELET COUNT CONFIRMED BY SMEAR   Differential     Status: Abnormal   Collection Time: 11/10/16  1:42 PM  Result Value Ref Range   Neutrophils Relative % 85 %   Lymphocytes Relative 7 %   Monocytes Relative 8 %   Eosinophils Relative 0 %   Basophils Relative 0 %   Neutro Abs 12.3 (H) 1.7 - 7.7 K/uL   Lymphs Abs 1.0 0.7 - 4.0 K/uL   Monocytes Absolute 1.2 (H) 0.1 - 1.0 K/uL   Eosinophils Absolute 0.0 0.0 - 0.7 K/uL   Basophils Absolute 0.0 0.0 - 0.1 K/uL   WBC Morphology VACUOLATED NEUTROPHILS   Hepatic function panel     Status: Abnormal   Collection Time: 11/10/16  1:42 PM  Result Value Ref Range   Total Protein 6.6 6.5 - 8.1 g/dL   Albumin 2.8 (L) 3.5 - 5.0 g/dL   AST 38 15 - 41 U/L   ALT 20 14 - 54 U/L   Alkaline Phosphatase 249 (H) 38 - 126 U/L   Total Bilirubin 1.0 0.3 - 1.2 mg/dL   Bilirubin, Direct 0.5 0.1 - 0.5 mg/dL   Indirect Bilirubin 0.5 0.3 - 0.9 mg/dL  Magnesium     Status: Abnormal   Collection  Time: 11/10/16  1:50 PM  Result Value Ref Range   Magnesium 1.5 (L) 1.7 - 2.4 mg/dL  POCT i-Stat troponin I     Status: None   Collection Time: 11/10/16  1:51 PM  Result Value Ref Range   Troponin i, poc 0.00 0.00 - 0.08 ng/mL   Comment 3            Comment: Due to the release kinetics of cTnI, a negative result within the first hours of the onset of symptoms does not rule out myocardial infarction with certainty. If myocardial infarction is still suspected, repeat the test at appropriate intervals.   Urinalysis, Routine w reflex microscopic     Status: Abnormal   Collection Time: 11/10/16  2:25 PM  Result Value Ref Range   Color, Urine AMBER (A) YELLOW    Comment: BIOCHEMICALS MAY BE AFFECTED BY COLOR   APPearance CLOUDY (A) CLEAR   Specific Gravity, Urine 1.017 1.005 - 1.030   pH 5.0 5.0 - 8.0   Glucose, UA 50 (A) NEGATIVE mg/dL   Hgb urine dipstick MODERATE (A) NEGATIVE   Bilirubin Urine SMALL (A) NEGATIVE   Ketones, ur NEGATIVE NEGATIVE mg/dL   Protein, ur 100 (A) NEGATIVE mg/dL   Nitrite NEGATIVE NEGATIVE   Leukocytes, UA MODERATE (A) NEGATIVE   RBC / HPF TOO NUMEROUS TO COUNT 0 - 5 RBC/hpf   WBC, UA TOO NUMEROUS TO COUNT 0 - 5 WBC/hpf   Bacteria, UA MANY (A) NONE SEEN   Squamous Epithelial / LPF 0-5 (A) NONE SEEN   Mucous PRESENT    Hyaline Casts, UA PRESENT   CG4 I-STAT (Lactic acid)     Status: Abnormal   Collection Time: 11/10/16  2:57 PM  Result Value Ref Range   Lactic Acid, Venous 3.26 (HH) 0.5 - 1.9 mmol/L   Comment NOTIFIED PHYSICIAN   Pregnancy, urine     Status: None   Collection Time: 11/10/16  3:19 PM  Result Value Ref Range   Preg Test, Ur NEGATIVE NEGATIVE    Comment:        THE SENSITIVITY OF THIS METHODOLOGY IS >20 mIU/mL.   Blood gas, arterial     Status: Abnormal   Collection Time: 11/10/16  4:40 PM  Result Value Ref Range   O2 Content ROOM AIR L/min   pH, Arterial 7.426 7.350 -  7.450   pCO2 arterial 35.8 32.0 - 48.0 mmHg   pO2, Arterial  66.6 (L) 83.0 - 108.0 mmHg   Bicarbonate 23.1 20.0 - 28.0 mmol/L   Acid-base deficit 0.5 0.0 - 2.0 mmol/L   O2 Saturation 93.1 %   Patient temperature 98.6    Collection site BRACHIAL ARTERY    Drawn by [id number]    Sample type ARTERIAL DRAW    Allens test (pass/fail) PASS PASS  CG4 I-STAT (Lactic acid)     Status: Abnormal   Collection Time: 11/10/16  5:33 PM  Result Value Ref Range   Lactic Acid, Venous 2.11 (HH) 0.5 - 1.9 mmol/L   Comment NOTIFIED PHYSICIAN    Dg Chest 2 View  Result Date: 11/10/2016 CLINICAL DATA:  Chest pain, shortness of breath for 1 week EXAM: CHEST  2 VIEW COMPARISON:  CT chest 03/12/2016, chest x-ray 03/12/2016 FINDINGS: There are bilateral nodular airspace opacities throughout the upper and lower lobes bilaterally. There is no pleural effusion or pneumothorax. The heart and mediastinal contours are unremarkable. The osseous structures are unremarkable. IMPRESSION: New bilateral nodular airspace opacities throughout the upper and lower lobes bilaterally. Findings are concerning for multifocal infection such as septic emboli. Recommend further evaluation with a CT of the chest with intravenous contrast. Electronically Signed   By: Kathreen Devoid   On: 11/10/2016 13:23    Review of Systems  Constitutional: Positive for malaise/fatigue.  Neurological: Positive for weakness.  Psychiatric/Behavioral: Positive for depression.  All other systems reviewed and are negative.   Blood pressure (!) 72/42, pulse (!) 115, temperature 98.8 F (37.1 C), temperature source Oral, resp. rate (!) 23, height 5' 3"  (1.6 m), weight 120 lb (54.4 kg), last menstrual period 10/27/2016, SpO2 94 %. Physical Exam  Constitutional: She is oriented to person, place, and time. She appears lethargic. She appears cachectic. She is sleeping and uncooperative.  HENT:  Head: Normocephalic and atraumatic.  Eyes: EOM are normal. Pupils are equal, round, and reactive to light. Right eye exhibits no  discharge.  Neck: Normal range of motion. Neck supple.  Cardiovascular: Normal rate.   Murmur heard. Respiratory: Breath sounds normal. She is in respiratory distress.  GI: Soft. Bowel sounds are normal. She exhibits no distension.  Musculoskeletal: Normal range of motion. She exhibits no edema.  Neurological: She is oriented to person, place, and time. She appears lethargic.  arousable but sleepy   Skin: Skin is dry.     Assessment/Plan Admit to icu Get ct chest to look for cavitary pneumonia and lung parenchyma better  Stat echo to rule out endocarditis Pan cx Trend lactic acid. vanc and cefepime Sepsis bundle Aggressive ivf Left IJ CVL placed Wean pressors as tolerated Gi and dvt px Liquid diet only AKi - likely dehydration - monitor Cr Labs and imaging reviewed  The patient is critically ill with multiple organ systems failure and requires high complexity decision making for assessment and support, frequent evaluation and titration of therapies, application of advanced monitoring technologies and extensive interpretation of multiple databases.   Critical Care Time devoted to patient care services described in this note is 60  Minutes. This time reflects time of care of this signee: Sharia Reeve, MD.     Sharia Reeve, MD 11/10/2016, 6:37 PM

## 2016-11-10 NOTE — Progress Notes (Signed)
CRITICAL VALUE ALERT  Critical Value:  Troponin 0.05  Date & Time Notied:  11/10/2016 2230  Provider Notified: Pola CornElink MD  Orders Received/Actions taken: no new orders at this time

## 2016-11-10 NOTE — Progress Notes (Signed)
Spoke with Pola CornElink MD Dr. Katrinka BlazingSmith. Okay to use central line after being pulled back.   Per Dr. Katrinka BlazingSmith, keep vasopressin infusing, titrate phenylephrine down and levophed up to keep MAP greater than 65.   Patient having episodes of anxiety, verbal order for 2mg  IV ativan q4hr PRN received.

## 2016-11-10 NOTE — ED Provider Notes (Signed)
[facility]-EMERGENCY DEPT Provider Note   CSN: [id number] Arrival date & time: 11/10/16  1252  Level V caveat unstable vital signs   History   Chief Complaint No chief complaint on file. Chief complaint chest pain. Complains of chest pain diffuse rating to her back onset 4 or 5 days ago. Pain is pleuritic. No treatment prior to coming here.  Patient last injected herself with heroin into her left arm this morning. Associated symptoms include generalized weakness which has become progressively worse and worse with walking onset 4 or 5 days ago. No vomiting. She also reports fever  HPI Savannah Palmer is a 26 y.o. female.  HPI  Past Medical History:  Diagnosis Date  . Anxiety   . Anxiety   . Depression     Patient Active Problem List   Diagnosis Date Noted  . Lung abscess (HCC) 03/13/2016  . Normocytic anemia 03/12/2016  . Cellulitis   . Polysubstance dependence including opioid drug with daily use (HCC) 01/19/2016  . Hepatitis C antibody test positive 01/17/2016  . AKI (acute kidney injury) (HCC) 01/13/2016  . IVDU (intravenous drug user) 01/13/2016  . Polysubstance (including opioids) dependence, daily use (HCC) 01/13/2016  . Murmur, heart 01/13/2016  . Altered mental status     Past Surgical History:  Procedure Laterality Date  . CHOLECYSTECTOMY      OB History    No data available       Home Medications    Prior to Admission medications   Medication Sig Start Date End Date Taking? Authorizing Provider  amoxicillin-clavulanate (AUGMENTIN) 875-125 MG tablet Take 1 tablet by mouth 2 (two) times daily. 03/16/16   Elgergawy, Leana Roeawood S, MD  busPIRone (BUSPAR) 10 MG tablet Take 10 mg by mouth 2 (two) times daily. 02/15/16   [provider]  citalopram (CELEXA) 10 MG tablet Take 1 tablet (10 mg total) by mouth daily. 01/23/16   Oneta RackLewis, Tanika N, NP  clonazePAM (KLONOPIN) 0.5 MG tablet Take 0.5 tablets (0.25 mg total) by mouth daily. Patient taking differently: Take  0.5 mg by mouth daily.  01/24/16   Oneta RackLewis, Tanika N, NP  gabapentin (NEURONTIN) 300 MG capsule Take 1 capsule (300 mg total) by mouth 3 (three) times daily. 01/23/16   Oneta RackLewis, Tanika N, NP  nicotine polacrilex (NICORETTE) 2 MG gum Take 1 each (2 mg total) by mouth as needed for smoking cessation. Patient not taking: Reported on 03/12/2016 01/23/16   Oneta RackLewis, Tanika N, NP  saccharomyces boulardii (FLORASTOR) 250 MG capsule Take 1 capsule (250 mg total) by mouth 2 (two) times daily. 03/16/16   Elgergawy, Leana Roeawood S, MD  SUBOXONE 8-2 MG FILM Take 2.5 each by mouth daily. 02/28/16   [provider]    Family History Family History  Problem Relation Age of Onset  . Tuberculosis Neg Hx   . Diabetes Mellitus II Neg Hx     Social History Social History  Substance Use Topics  . Smoking status: Current Some Day Smoker    Packs/day: 1.00  . Smokeless tobacco: Never Used  . Alcohol use Yes     Comment: occasinally socially   Positive IV drug use using heroin and cocaine  Allergies   Patient has no known allergies.   Review of Systems Review of Systems  Unable to perform ROS: Unstable vital signs  Constitutional: Positive for fever.  Cardiovascular: Positive for chest pain.  Neurological: Positive for weakness.     Physical Exam Updated Vital Signs BP (!) 84/46  Pulse (!) 121   Temp 97.8 F (36.6 C) (Oral)   Resp (!) 21   Ht 5\' 3"  (1.6 m)   Wt 54.4 kg (120 lb)   LMP 10/27/2016   SpO2 100%   BMI 21.26 kg/m   Physical Exam  Constitutional: She is oriented to person, place, and time.  Ill-appearing  HENT:  Head: Normocephalic and atraumatic.  Mucous membranes dry  Eyes: Conjunctivae are normal. Pupils are equal, round, and reactive to light.  Neck: Neck supple. No tracheal deviation present. No thyromegaly present.  Cardiovascular: Regular rhythm.   No murmur heard. Tachycardic  Pulmonary/Chest: Effort normal and breath sounds normal.  Abdominal: Soft. Bowel sounds are  normal. She exhibits no distension. There is no tenderness.  Musculoskeletal: Normal range of motion. She exhibits no edema or tenderness.  Neurological: She is alert and oriented to person, place, and time. Coordination normal.  Skin: Skin is warm and dry. No rash noted.  Psychiatric: She has a normal mood and affect.  Nursing note and vitals reviewed.    ED Treatments / Results  Labs (all labs ordered are listed, but only abnormal results are displayed) Labs Reviewed  BASIC METABOLIC PANEL - Abnormal; Notable for the following:       Result Value   Sodium 129 (*)    Potassium 3.2 (*)    Chloride 91 (*)    Glucose, Bld 127 (*)    BUN 34 (*)    Creatinine, Ser 1.36 (*)    Calcium 8.5 (*)    GFR calc non Af Amer 53 (*)    All other components within normal limits  CBC - Abnormal; Notable for the following:    WBC 14.5 (*)    Hemoglobin 11.2 (*)    HCT 32.5 (*)    All other components within normal limits  CG4 I-STAT (LACTIC ACID) - Abnormal; Notable for the following:    Lactic Acid, Venous 3.26 (*)    All other components within normal limits  CULTURE, BLOOD (ROUTINE X 2)  CULTURE, BLOOD (ROUTINE X 2)  COMPREHENSIVE METABOLIC PANEL  CBC WITH DIFFERENTIAL/PLATELET  URINALYSIS, ROUTINE W REFLEX MICROSCOPIC  I-STAT TROPOININ, ED  POCT I-STAT TROPONIN I  I-STAT CG4 LACTIC ACID, ED   Results for orders placed or performed during the hospital encounter of 11/10/16  Basic metabolic panel  Result Value Ref Range   Sodium 129 (L) 135 - 145 mmol/L   Potassium 3.2 (L) 3.5 - 5.1 mmol/L   Chloride 91 (L) 101 - 111 mmol/L   CO2 24 22 - 32 mmol/L   Glucose, Bld 127 (H) 65 - 99 mg/dL   BUN 34 (H) 6 - 20 mg/dL   Creatinine, Ser 5.36 (H) 0.44 - 1.00 mg/dL   Calcium 8.5 (L) 8.9 - 10.3 mg/dL   GFR calc non Af Amer 53 (L) >60 mL/min   GFR calc Af Amer >60 >60 mL/min   Anion gap 14 5 - 15  CBC  Result Value Ref Range   WBC 14.5 (H) 4.0 - 10.5 K/uL   RBC 3.93 3.87 - 5.11 MIL/uL    Hemoglobin 11.2 (L) 12.0 - 15.0 g/dL   HCT 64.4 (L) 03.4 - 74.2 %   MCV 82.7 78.0 - 100.0 fL   MCH 28.5 26.0 - 34.0 pg   MCHC 34.5 30.0 - 36.0 g/dL   RDW 59.5 63.8 - 75.6 %   Platelets 119 (L) 150 - 400 K/uL  Urinalysis, Routine w reflex microscopic  Result Value Ref Range   Color, Urine AMBER (A) YELLOW   APPearance CLOUDY (A) CLEAR   Specific Gravity, Urine 1.017 1.005 - 1.030   pH 5.0 5.0 - 8.0   Glucose, UA 50 (A) NEGATIVE mg/dL   Hgb urine dipstick MODERATE (A) NEGATIVE   Bilirubin Urine SMALL (A) NEGATIVE   Ketones, ur NEGATIVE NEGATIVE mg/dL   Protein, ur 161 (A) NEGATIVE mg/dL   Nitrite NEGATIVE NEGATIVE   Leukocytes, UA MODERATE (A) NEGATIVE   RBC / HPF TOO NUMEROUS TO COUNT 0 - 5 RBC/hpf   WBC, UA TOO NUMEROUS TO COUNT 0 - 5 WBC/hpf   Bacteria, UA MANY (A) NONE SEEN   Squamous Epithelial / LPF 0-5 (A) NONE SEEN   Mucous PRESENT    Hyaline Casts, UA PRESENT   Pregnancy, urine  Result Value Ref Range   Preg Test, Ur NEGATIVE NEGATIVE  Magnesium  Result Value Ref Range   Magnesium 1.5 (L) 1.7 - 2.4 mg/dL  Differential  Result Value Ref Range   Neutrophils Relative % 85 %   Lymphocytes Relative 7 %   Monocytes Relative 8 %   Eosinophils Relative 0 %   Basophils Relative 0 %   Neutro Abs 12.3 (H) 1.7 - 7.7 K/uL   Lymphs Abs 1.0 0.7 - 4.0 K/uL   Monocytes Absolute 1.2 (H) 0.1 - 1.0 K/uL   Eosinophils Absolute 0.0 0.0 - 0.7 K/uL   Basophils Absolute 0.0 0.0 - 0.1 K/uL   WBC Morphology VACUOLATED NEUTROPHILS   Hepatic function panel  Result Value Ref Range   Total Protein 6.6 6.5 - 8.1 g/dL   Albumin 2.8 (L) 3.5 - 5.0 g/dL   AST 38 15 - 41 U/L   ALT 20 14 - 54 U/L   Alkaline Phosphatase 249 (H) 38 - 126 U/L   Total Bilirubin 1.0 0.3 - 1.2 mg/dL   Bilirubin, Direct 0.5 0.1 - 0.5 mg/dL   Indirect Bilirubin 0.5 0.3 - 0.9 mg/dL  Blood gas, arterial  Result Value Ref Range   O2 Content ROOM AIR L/min   pH, Arterial 7.426 7.350 - 7.450   pCO2 arterial 35.8 32.0  - 48.0 mmHg   pO2, Arterial 66.6 (L) 83.0 - 108.0 mmHg   Bicarbonate 23.1 20.0 - 28.0 mmol/L   Acid-base deficit 0.5 0.0 - 2.0 mmol/L   O2 Saturation 93.1 %   Patient temperature 98.6    Collection site BRACHIAL ARTERY    Drawn by [id number]    Sample type ARTERIAL DRAW    Allens test (pass/fail) PASS PASS  POCT i-Stat troponin I  Result Value Ref Range   Troponin i, poc 0.00 0.00 - 0.08 ng/mL   Comment 3          CG4 I-STAT (Lactic acid)  Result Value Ref Range   Lactic Acid, Venous 3.26 (HH) 0.5 - 1.9 mmol/L   Comment NOTIFIED PHYSICIAN   CG4 I-STAT (Lactic acid)  Result Value Ref Range   Lactic Acid, Venous 2.11 (HH) 0.5 - 1.9 mmol/L   Comment NOTIFIED PHYSICIAN    Dg Chest 2 View  Result Date: 11/10/2016 CLINICAL DATA:  Chest pain, shortness of breath for 1 week EXAM: CHEST  2 VIEW COMPARISON:  CT chest 03/12/2016, chest x-ray 03/12/2016 FINDINGS: There are bilateral nodular airspace opacities throughout the upper and lower lobes bilaterally. There is no pleural effusion or pneumothorax. The heart and mediastinal contours are unremarkable. The osseous structures are unremarkable. IMPRESSION: New bilateral nodular airspace opacities  throughout the upper and lower lobes bilaterally. Findings are concerning for multifocal infection such as septic emboli. Recommend further evaluation with a CT of the chest with intravenous contrast. Electronically Signed   By: Elige Ko   On: 11/10/2016 13:23   EKG  EKG Interpretation  Date/Time:  Saturday Nov 10 2016 13:07:22 EDT Ventricular Rate:  141 PR Interval:    QRS Duration: 80 QT Interval:  283 QTC Calculation: 434 R Axis:   67 Text Interpretation:  Age not entered, assumed to be  26 years old for purpose of ECG interpretation Sinus tachycardia No significant change since last tracing Reconfirmed by Doug Sou [phone]) on 11/10/2016 2:58:52 PM     Chest x-ray viewed by me  Radiology Dg Chest 2 View  Result Date:  11/10/2016 CLINICAL DATA:  Chest pain, shortness of breath for 1 week EXAM: CHEST  2 VIEW COMPARISON:  CT chest 03/12/2016, chest x-ray 03/12/2016 FINDINGS: There are bilateral nodular airspace opacities throughout the upper and lower lobes bilaterally. There is no pleural effusion or pneumothorax. The heart and mediastinal contours are unremarkable. The osseous structures are unremarkable. IMPRESSION: New bilateral nodular airspace opacities throughout the upper and lower lobes bilaterally. Findings are concerning for multifocal infection such as septic emboli. Recommend further evaluation with a CT of the chest with intravenous contrast. Electronically Signed   By: Elige Ko   On: 11/10/2016 13:23    Procedures Procedures (including critical care time)  Medications Ordered in ED Medications  vancomycin (VANCOCIN) IVPB 1000 mg/200 mL premix (1,000 mg Intravenous New Bag/Given 11/10/16 1500)  sodium chloride 0.9 % bolus 2,000 mL (1,000 mLs Intravenous New Bag/Given 11/10/16 1454)  piperacillin-tazobactam (ZOSYN) IVPB 3.375 g (0 g Intravenous Stopped 11/10/16 1503)     Initial Impression / Assessment and Plan / ED Course  I have reviewed the triage vital signs and the nursing notes.  Pertinent labs & imaging results that were available during my care of the patient were reviewed by me and considered in my medical decision making (see chart for details).     Code sepsis called. Source of infection felt to be respiratory. 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 I spoke with hospital pharmacist who suggested that vancomycin and Zosyn will adequately treat right-sided endocarditis however in light of patient's acute kidney injury, antibiotic choices may need to be altered for future doses 5 PM patient states she feels somewhat better treatment with after IV fluids and IV antibiotics  Sepsis - Repeat Assessment  Performed at:    525pm  Vitals     Blood pressure (!) 70/40,  pulse (!) 119, temperature 97.8 F (36.6 C), temperature source Oral, resp. rate (!) 22, height 5\' 3"  (1.6 m), weight 54.4 kg (120 lb), last menstrual period 10/27/2016, SpO2 94 %.  Heart:     Tachycardic  Lungs:    Rales  Capillary Refill:   <2 sec  Peripheral Pulse:   Radial pulse palpable  Skin:     Diaphoretic  4:25 PM patient remains hypotensive after intravenous fluid bolus and antibiotics. IV Neo-Synephrine ordered Intensivist service consulted for admission 5:40 PM patient sleeping arousable. Gentle shaking. 455 PM patient feels improved after treatment with intravenous Neo-Synephrine. She is awake and alert. Glasgow Coma Score 15. Skin now warm and dry I I consulted Dr. Katrinka Blazing from intensive care unit service and intensivist present in room at 5:55 PM to arrange for admission  patient did not receive the naloxone as she became more awake  and alert spontaneously spontaneously. Oral potassium supplementation ordered. IV magnesium ordered Septic emboli a likely possibility of source of infection in light of recent IV drug use. Final Clinical Impressions(s) / ED Diagnoses  Diagnosis #1 septic shock #2 acute kidney injury #3 acute pleuritic chest pain #4 hypokalemia #5 hypomagnesemia Final diagnoses:  None  CRITICAL CARE Performed by: Doug Sou Total critical care time: 90 minutes Critical care time was exclusive of separately billable procedures and treating other patients. Critical care was necessary to treat or prevent imminent or life-threatening deterioration. Critical care was time spent personally by me on the following activities: development of treatment plan with patient and/or surrogate as well as nursing, discussions with consultants, evaluation of patient's response to treatment, examination of patient, obtaining history from patient or surrogate, ordering and performing treatments and interventions, ordering and review of laboratory studies, ordering and review  of radiographic studies, pulse oximetry and re-evaluation of patient's condition.  New Prescriptions New Prescriptions   No medications on file     Doug Sou, MD 11/10/16 [phone]

## 2016-11-10 NOTE — Progress Notes (Signed)
Pharmacy Antibiotic Note  Savannah Palmer is a 26 y.o. female admitted on 11/10/2016 with sepsis.  Pharmacy has been consulted for vanc/cefepime dosing.  Plan: Vancomycin 1g x 1 then 500mg  IV every 12 hours.  Goal trough 15-20 mcg/mL. The dose of cefepime will be adjusted to 1g q12 based on renal function.  Height: 5\' 3"  (160 cm) Weight: 120 lb (54.4 kg) IBW/kg (Calculated) : 52.4  Temp (24hrs), Avg:98.4 F (36.9 C), Min:97.8 F (36.6 C), Max:98.8 F (37.1 C)   Recent Labs Lab 11/10/16 1342 11/10/16 1457 11/10/16 1733  WBC 14.5*  --   --   CREATININE 1.36*  --   --   LATICACIDVEN  --  3.26* 2.11*    Estimated Creatinine Clearance: 51.9 mL/min (A) (by C-G formula based on SCr of 1.36 mg/dL (H)).    No Known Allergies   Thank you for allowing pharmacy to be a part of this patient's care.   Hessie KnowsJustin M Patrik Turnbaugh, PharmD, BCPS Pager [phone] 11/10/2016 7:49 PM

## 2016-11-10 NOTE — ED Notes (Signed)
Abnormal lab result MD Jacubowitz have been made aware 

## 2016-11-10 NOTE — Progress Notes (Addendum)
PT is IVDA admitted with septic shock, septic emboli, has RIJ for pressor support. IJ noted to be quite deep and radiology advised 10 cm retraction.   Site was cleaned with chlorhexidine scrubs, patient was draped, sterile technique was used, catheter was retracted approximately 10 cm, and resecured with a suture and sterile dressings. Portable chest x-ray has been ordered to confirm appropriate placement. Patient tolerated the procedure well with no immediate complications. There was a brief run of nonsustained VT during retraction. No blood loss. Appreciate nursing staff assistance.

## 2016-11-10 NOTE — Procedures (Signed)
Central Venous Catheter Insertion Procedure Note Savannah Palmer [id number] 12/05/1990  Procedure: Insertion of Central Venous Catheter Indications: patient on pressors, needing better access.   Procedure Details Consent: Risks of procedure as well as the alternatives and risks of each were explained to the (patient/caregiver).  Consent for procedure obtained. Time Out: Verified patient identification, verified procedure, site/side was marked, verified correct patient position, special equipment/implants available, medications/allergies/relevent history reviewed, required imaging and test results available.  Performed  Maximum sterile technique was used including antiseptics, cap, gloves, gown, hand hygiene, mask and sheet. Skin prep: Chlorhexidine; local anesthetic administered A antimicrobial bonded/coated triple lumen catheter was placed in the right internal jugular vein using the Seldinger technique.  Evaluation Blood flow good Complications: No apparent complications Patient did tolerate procedure well. Chest X-ray ordered to verify placement.  CXR: pending.  Savannah Palmer 11/10/2016, 6:21 PM

## 2016-11-10 NOTE — ED Triage Notes (Signed)
She c/o some chest discomfrot and shortness of breath x 1 week. She states these are similar s/s she had a year ago, at which time she was dx with "pulmonary abscess". She is in no distress. She tells me she is a habitual IV drug user. EKG performed at triage.

## 2016-11-10 NOTE — ED Notes (Signed)
Per admitting MD, cental line ok to use. He reports that it needs to be retracted, but his "fellow will come and do it. The line is ok to use for now." RN Aram BeechamCynthia made aware

## 2016-11-11 ENCOUNTER — Inpatient Hospital Stay (HOSPITAL_COMMUNITY): Payer: BLUE CROSS/BLUE SHIELD

## 2016-11-11 DIAGNOSIS — F1490 Cocaine use, unspecified, uncomplicated: Secondary | ICD-10-CM

## 2016-11-11 DIAGNOSIS — Z9049 Acquired absence of other specified parts of digestive tract: Secondary | ICD-10-CM

## 2016-11-11 DIAGNOSIS — I339 Acute and subacute endocarditis, unspecified: Secondary | ICD-10-CM

## 2016-11-11 DIAGNOSIS — F1190 Opioid use, unspecified, uncomplicated: Secondary | ICD-10-CM

## 2016-11-11 DIAGNOSIS — R7881 Bacteremia: Secondary | ICD-10-CM | POA: Diagnosis present

## 2016-11-11 DIAGNOSIS — Z452 Encounter for adjustment and management of vascular access device: Secondary | ICD-10-CM

## 2016-11-11 DIAGNOSIS — E860 Dehydration: Secondary | ICD-10-CM

## 2016-11-11 DIAGNOSIS — J9690 Respiratory failure, unspecified, unspecified whether with hypoxia or hypercapnia: Secondary | ICD-10-CM

## 2016-11-11 DIAGNOSIS — A419 Sepsis, unspecified organism: Secondary | ICD-10-CM

## 2016-11-11 DIAGNOSIS — B9562 Methicillin resistant Staphylococcus aureus infection as the cause of diseases classified elsewhere: Secondary | ICD-10-CM | POA: Diagnosis present

## 2016-11-11 DIAGNOSIS — R6521 Severe sepsis with septic shock: Secondary | ICD-10-CM

## 2016-11-11 DIAGNOSIS — B9689 Other specified bacterial agents as the cause of diseases classified elsewhere: Secondary | ICD-10-CM

## 2016-11-11 DIAGNOSIS — J188 Other pneumonia, unspecified organism: Secondary | ICD-10-CM

## 2016-11-11 DIAGNOSIS — J189 Pneumonia, unspecified organism: Secondary | ICD-10-CM

## 2016-11-11 LAB — BLOOD CULTURE ID PANEL (REFLEXED)
Acinetobacter baumannii: NOT DETECTED
Candida albicans: NOT DETECTED
Candida glabrata: NOT DETECTED
Candida krusei: NOT DETECTED
Candida parapsilosis: NOT DETECTED
Candida tropicalis: NOT DETECTED
ENTEROCOCCUS SPECIES: NOT DETECTED
Enterobacter cloacae complex: NOT DETECTED
Enterobacteriaceae species: NOT DETECTED
Escherichia coli: NOT DETECTED
Haemophilus influenzae: NOT DETECTED
Klebsiella oxytoca: NOT DETECTED
Klebsiella pneumoniae: NOT DETECTED
LISTERIA MONOCYTOGENES: NOT DETECTED
Methicillin resistance: DETECTED — AB
NEISSERIA MENINGITIDIS: NOT DETECTED
PROTEUS SPECIES: NOT DETECTED
Pseudomonas aeruginosa: NOT DETECTED
SERRATIA MARCESCENS: NOT DETECTED
STAPHYLOCOCCUS SPECIES: DETECTED — AB
STREPTOCOCCUS AGALACTIAE: NOT DETECTED
STREPTOCOCCUS SPECIES: NOT DETECTED
Staphylococcus aureus (BCID): DETECTED — AB
Streptococcus pneumoniae: NOT DETECTED
Streptococcus pyogenes: NOT DETECTED

## 2016-11-11 LAB — GLUCOSE, CAPILLARY
GLUCOSE-CAPILLARY: 190 mg/dL — AB (ref 65–99)
GLUCOSE-CAPILLARY: 146 mg/dL — AB (ref 65–99)
GLUCOSE-CAPILLARY: 148 mg/dL — AB (ref 65–99)
Glucose-Capillary: 126 mg/dL — ABNORMAL HIGH (ref 65–99)
Glucose-Capillary: 184 mg/dL — ABNORMAL HIGH (ref 65–99)
Glucose-Capillary: 115 mg/dL — ABNORMAL HIGH (ref 65–99)

## 2016-11-11 LAB — BLOOD GAS, ARTERIAL
ACID-BASE DEFICIT: 1.7 mmol/L (ref 0.0–2.0)
BICARBONATE: 21.6 mmol/L (ref 20.0–28.0)
Drawn by: 44135
O2 CONTENT: 2 L/min
O2 SAT: 98.9 %
PCO2 ART: 31.9 mmHg — AB (ref 32.0–48.0)
PO2 ART: 128 mmHg — AB (ref 83.0–108.0)
Patient temperature: 97.5
pH, Arterial: 7.442 (ref 7.350–7.450)

## 2016-11-11 LAB — BASIC METABOLIC PANEL
Anion gap: 7 (ref 5–15)
BUN: 24 mg/dL — AB (ref 6–20)
CO2: 23 mmol/L (ref 22–32)
CREATININE: 0.68 mg/dL (ref 0.44–1.00)
Calcium: 7.6 mg/dL — ABNORMAL LOW (ref 8.9–10.3)
Chloride: 108 mmol/L (ref 101–111)
GFR calc Af Amer: >60 mL/min (ref >60)
Glucose, Bld: 175 mg/dL — ABNORMAL HIGH (ref 65–99)
POTASSIUM: 3.3 mmol/L — AB (ref 3.5–5.1)
SODIUM: 138 mmol/L (ref 135–145)

## 2016-11-11 LAB — PHOSPHORUS: Phosphorus: 1.9 mg/dL — ABNORMAL LOW (ref 2.5–4.6)

## 2016-11-11 LAB — ECHOCARDIOGRAM COMPLETE
Height: 63 in
Weight: 2028.23 oz

## 2016-11-11 LAB — CBC
HCT: 26.4 % — ABNORMAL LOW (ref 36.0–46.0)
Hemoglobin: 9 g/dL — ABNORMAL LOW (ref 12.0–15.0)
MCH: 28.1 pg (ref 26.0–34.0)
MCHC: 34.1 g/dL (ref 30.0–36.0)
MCV: 82.5 fL (ref 78.0–100.0)
PLATELETS: 91 10*3/uL — AB (ref 150–400)
RBC: 3.2 MIL/uL — AB (ref 3.87–5.11)
RDW: 14.4 % (ref 11.5–15.5)
WBC: 10.5 10*3/uL (ref 4.0–10.5)

## 2016-11-11 LAB — CORTISOL: CORTISOL PLASMA: 99 ug/dL

## 2016-11-11 LAB — ABO/RH: ABO/RH(D): O POS

## 2016-11-11 LAB — MAGNESIUM: Magnesium: 2.6 mg/dL — ABNORMAL HIGH (ref 1.7–2.4)

## 2016-11-11 LAB — LACTIC ACID, PLASMA: LACTIC ACID, VENOUS: 1.6 mmol/L (ref 0.5–1.9)

## 2016-11-11 LAB — STREP PNEUMONIAE URINARY ANTIGEN: Strep Pneumo Urinary Antigen: NEGATIVE

## 2016-11-11 MED ORDER — VANCOMYCIN HCL IN DEXTROSE 750-5 MG/150ML-% IV SOLN
750.0000 mg | Freq: Three times a day (TID) | INTRAVENOUS | Status: DC
  Administered 2016-11-11 – 2016-11-13 (×6): 750 mg via INTRAVENOUS
  Filled 2016-11-11 (×7): qty 150

## 2016-11-11 MED ORDER — CHLORHEXIDINE GLUCONATE CLOTH 2 % EX PADS
6.0000 | MEDICATED_PAD | Freq: Every day | CUTANEOUS | Status: AC
Start: 2016-11-11 — End: 2016-11-16
  Administered 2016-11-11 – 2016-11-14 (×4): 6 via TOPICAL

## 2016-11-11 MED ORDER — SODIUM CHLORIDE 0.9% FLUSH: 10.0000 mL | INTRAVENOUS | Status: DC | PRN

## 2016-11-11 MED ORDER — MUPIROCIN 2 % EX OINT
1.0000 "application " | TOPICAL_OINTMENT | Freq: Two times a day (BID) | CUTANEOUS | Status: AC
  Administered 2016-11-11 – 2016-11-15 (×10): 1 via NASAL
  Filled 2016-11-11 (×3): qty 22

## 2016-11-11 NOTE — Progress Notes (Signed)
*  PRELIMINARY RESULTS* Echocardiogram 2D Echocardiogram has been performed.  Savannah Palmer, Savannah Palmer 11/11/2016, 1:26 PM

## 2016-11-11 NOTE — H&P (Signed)
Savannah LimeVictoria L Maslin is an 26 y.o. female.   Chief Complaint: weakness HPI:   Poor historian Comes with weakness. Admits to doing crack and heroin.  Savannah Palmer is a 26 y.o. female with a history of anxiety, depression, and polysubstance abuse, who presents to the Emergency Department complaining of  substance abuse that began earlier tonight. Patient reports taking both heroine and crack-cocaine at 12 AM tonight. Patient has no associated symptoms. No modifying factors indicated. Patient denies any alcohol use. Patient denies any fever, chest pain, shortness of breath, abdominal pain, or any other symptoms.   The history is provided by the patient. No language interpreter was used.   HPI  This is a new problem. The current episode started more than 1 week ago. The problem occurs constantly. The problem has not changed since onset.Pertinent negatives include no chest pain, no headaches and no shortness of breath. Nothing aggravates the symptoms. Nothing relieves the symptoms. She has tried nothing for the symptoms. The treatment provided no relief.   Dg Chest 2 View  Result Date: 11/10/2016 CLINICAL DATA:  Chest pain, shortness of breath for 1 week EXAM: CHEST  2 VIEW COMPARISON:  CT chest 03/12/2016, chest x-ray 03/12/2016 FINDINGS: There are bilateral nodular airspace opacities throughout the upper and lower lobes bilaterally. There is no pleural effusion or pneumothorax. The heart and mediastinal contours are unremarkable. The osseous structures are unremarkable. IMPRESSION: New bilateral nodular airspace opacities throughout the upper and lower lobes bilaterally. Findings are concerning for multifocal infection such as septic emboli. Recommend further evaluation with a CT of the chest with intravenous contrast. Electronically Signed   By: Elige KoHetal  Patel   On: 11/10/2016 13:23   Ct Chest Wo Contrast  Result Date: 11/10/2016 CLINICAL DATA:  She c/o some chest discomfrot and shortness of breath  x 1 week. She states these are similar s/s she had a year ago, at which time she was dx with "pulmonary abscess". She is in no distress. IV drug user EXAM: CT CHEST WITHOUT CONTRAST TECHNIQUE: Multidetector CT imaging of the chest was performed following the standard protocol without IV contrast. COMPARISON:  Chest CT angiogram dated 03/12/2016. FINDINGS: Cardiovascular: Probable small pericardial effusion and/or pericardial thickening, difficult to characterize without IV contrast. Heart size is within normal limits. Thoracic aorta appears to be normal in caliber. Mediastinum/Nodes: Soft tissue density material within the right upper peritracheal space, also difficult to characterize without IV contrast, presumed lymphadenopathy. Additional mild lymphadenopathy within the anterior mediastinum and aortopulmonary window region. Lungs/Pleura: Numerous cavitary consolidations throughout both lungs, peripherally distributed suggesting septic emboli. More confluent consolidations at each lung base, more likely atelectasis than pneumonia. Small right pleural effusion. Upper Abdomen: Suspected splenomegaly, incompletely imaged. Musculoskeletal: No acute or suspicious osseous finding. IMPRESSION: 1. Numerous cavitary consolidations throughout both lungs, with a peripheral distribution suggesting septic emboli. Largest cavitary consolidation is within the left lower lobe measuring approximately 4 cm greatest dimension. 2. Probable small pericardial effusion and/or pericardial wall thickening, difficult to delineate without IV contrast. 3. Probable mediastinal lymphadenopathy, again difficult to definitively characterize without IV contrast, alternatively confluent fluid or edema within the mediastinum. 4. Small right pleural effusion. 5. Probable splenomegaly, incompletely imaged at the lower aspects of this chest CT. 6. Right IJ line in place with tip passing through the right atrium and into the right ventricle. Recommend  retracting to the cavoatrial junction for optimal radiographic positioning. These results and recommendations were called by telephone at the time of interpretation on 11/10/2016  at 7:35 pm to Dr. Katrinka Blazing , who verbally acknowledged these results. Electronically Signed   By: Bary Richard M.D.   On: 11/10/2016 19:36   Dg Chest Port 1 View  Result Date: 11/11/2016 CLINICAL DATA:  Check right IJ line placement.  Initial encounter. EXAM: PORTABLE CHEST 1 VIEW COMPARISON:  Chest radiograph performed 11/10/2016 FINDINGS: The right IJ line is noted ending at the right atrium. This could be retracted 2-3 cm. Mild vascular congestion is noted. Mild left-sided opacities may reflect atelectasis or mild pneumonia. No pleural effusion or pneumothorax is seen. The cardiomediastinal silhouette is borderline normal in size. No acute osseous abnormalities are identified. IMPRESSION: 1. Right IJ line noted ending at the right atrium. This could be retracted 2-3 cm, as deemed clinically appropriate. 2. Mild vascular congestion noted. 3. Mild left-sided airspace opacities may reflect atelectasis or mild pneumonia. Electronically Signed   By: Roanna Raider M.D.   On: 11/11/2016 00:37   Dg Chest Port 1 View  Result Date: 11/10/2016 CLINICAL DATA:  Retraction of the IJ line about 10 cm EXAM: PORTABLE CHEST 1 VIEW COMPARISON:  CT chest 11/10/2016.  Chest 11/10/2016. FINDINGS: The left central venous catheter tip localizes to the mid/distal right atrial region. If placement at or above the cavoatrial junction is desired, retraction of about 3-4 cm is recommended. No pneumothorax. Shallow inspiration with atelectasis in the lung bases. Borderline heart size. Patchy airspace infiltrates in both lungs could indicate edema, aspiration, or pneumonia. No blunting of costophrenic angles. IMPRESSION: Central venous catheter tip is in the mid/ low right atrial region. If placement at or above the cavoatrial junction is desired, retraction of  about 3-4 cm is recommended. Electronically Signed   By: Burman Nieves M.D.   On: 11/10/2016 21:35   Dg Chest Portable 1 View  Result Date: 11/10/2016 CLINICAL DATA:  Status post central line placement. EXAM: PORTABLE CHEST 1 VIEW COMPARISON:  Chest x-ray from earlier same day. FINDINGS: Interval placement of a right IJ central line with tip passing to the left of midline and likely in the right ventricle or main pulmonary artery. The bilateral small nodular airspace opacities throughout both lungs are unchanged in the short-term interval. Suspect small bilateral pleural effusions. No pneumothorax seen. IMPRESSION: 1. Right IJ central line placement with tip positioned to the left of midline either within the right ventricle or main pulmonary artery. Recommend retracting at least 10 cm. 2. Bilateral small nodular airspace opacities throughout both lungs are unchanged in the short-term interval. As recommended on earlier chest x-ray report, would consider chest CT for further characterization. 3. Suspect small bilateral pleural effusions. Electronically Signed   By: Bary Richard M.D.   On: 11/10/2016 18:37    Review of Systems  Constitutional: Positive for diaphoresis and malaise/fatigue.  Neurological: Positive for weakness.  Psychiatric/Behavioral: Positive for depression.  All other systems reviewed and are negative.   Blood pressure 114/78, pulse (!) 111, temperature 97.4 F (36.3 C), temperature source Oral, resp. rate (!) 31, height 5\' 3"  (1.6 m), weight 57.5 kg (126 lb 12.2 oz), last menstrual period 10/27/2016, SpO2 96 %. Physical Exam  Constitutional: She is oriented to person, place, and time. She appears lethargic. She appears cachectic. She is sleeping and uncooperative.  HENT:  Head: Normocephalic and atraumatic.  Eyes: EOM are normal. Pupils are equal, round, and reactive to light. Right eye exhibits no discharge.  Neck: Normal range of motion. Neck supple.  Cardiovascular:  Normal rate.   Murmur heard.  Respiratory: Breath sounds normal. She is in respiratory distress.  GI: Soft. Bowel sounds are normal. She exhibits no distension.  Musculoskeletal: Normal range of motion. She exhibits no edema.  Neurological: She is oriented to person, place, and time. She appears lethargic.  arousable but sleepy   Skin: Skin is dry.     Assessment/Plan Admit to icu Get ct chest to look for cavitary pneumonia and lung parenchyma better  Stat echo to rule out endocarditis Pan cx Trend lactic acid. vanc and cefepime Sepsis bundle Aggressive ivf Left IJ CVL placed Wean pressors as tolerated Gi and dvt px Liquid diet only AKi - likely dehydration - monitor Cr Labs and imaging reviewed  Continue abx, will attempt to d/c pressors for BP support today.  Maintain in unit for monitoring.  ID following and appreciate input.  If able to get off pressors then will transfer care to [facility] and PCCM will sign off  The patient is critically ill with multiple organ systems failure and requires high complexity decision making for assessment and support, frequent evaluation and titration of therapies, application of advanced monitoring technologies and extensive interpretation of multiple databases.   Critical Care Time devoted to patient care services described in this note is  35  Minutes. This time reflects time of care of this signee Dr Koren Bound. This critical care time does not reflect procedure time, or teaching time or supervisory time of PA/NP/Med student/Med Resident etc but could involve care discussion time.  Alyson Reedy, M.D. [facility] Pulmonary/Critical Care Medicine. Pager: [phone]. After hours pager: [phone].

## 2016-11-11 NOTE — Progress Notes (Signed)
      INFECTIOUS DISEASE ATTENDING ADDENDUM:   Date: 11/11/2016  Patient name: Savannah Palmer  Medical record number: [id number]  Date of birth: 11-05-1990   I will see the patient formally later today       [facility] Staphylococcus aureus bacteremia   Staphylococcus aureus bacteremia (SAB) is associated with a high rate of complications and mortality.  Specific aspects of clinical management are critical to optimizing the outcome of patients with SAB.  Therefore, the [facility] Antimicrobial Management Team [facility]) has initiated an intervention aimed at improving the management of SAB at [facility].  To do so, Infectious Diseases physicians are providing an evidence-based consult for the management of all patients with SAB.     Yes No Comments  Perform follow-up blood cultures (even if the patient is afebrile) to ensure clearance of bacteremia [x]  []  Blood cultures ordered  Remove vascular catheter and obtain follow-up blood cultures after the removal of the catheter [x]  []  DO NOT PLACE PICC OR CENTRAL LINE UNTIL BLOOD IS CLEAR  IF THERE IS A CENTRAL LINE IT WILL NEED TO COME OUT FOR A CATHETER HOLIDAY ASAP  Perform echocardiography to evaluate for endocarditis (transthoracic ECHO is 40-50% sensitive, TEE is > 90% sensitive) [x]  []  Please keep in mind, that neither test can definitively EXCLUDE endocarditis, and that should clinical suspicion remain high for endocarditis the patient should then still be treated with an "endocarditis" duration of therapy = 6 weeks  Consult electrophysiologist to evaluate implanted cardiac device (pacemaker, ICD) []  []    Ensure source control [x]  []  Have all abscesses been drained effectively? Have deep seeded infections (septic joints or osteomyelitis) had appropriate surgical debridement?  Not clear  Investigate for "metastatic" sites of infection [x]  []  Does the patient have ANY symptom or physical exam finding that  would suggest a deeper infection (back or neck pain that may be suggestive of vertebral osteomyelitis or epidural abscess, muscle pain that could be a symptom of pyomyositis)?  Keep in mind that for deep seeded infections MRI imaging with contrast is preferred rather than other often insensitive tests such as plain x-rays, especially early in a patient's presentation.    Change antibiotic therapy to Vacomycin, no need for cefepime []  []  Beta-lactam antibiotics are preferred for MSSA due to higher cure rates.   If on Vancomycin, goal trough should be 15 - 20 mcg/mL  Estimated duration of IV antibiotic therapy:  4-6 weeks []  []  Consult case management for probably prolonged outpatient IV antibiotic therapy      Paulette BlanchCornelius Van Dam 11/11/2016, 9:28 AM

## 2016-11-11 NOTE — Progress Notes (Addendum)
Pharmacy Antibiotic Note  Savannah Palmer is a 26 y.o. female with recent Hx IVDU, admitted on 11/10/2016 with sepsis. Vancomycin and cefepime per Pharmacy  Noted to have GPCs in clusters in both blood Cx (BCID indicates MRSA). CT chest shows multiple cavitary lesions, likely septic emboli  Plan:  Increase vancomycin to 750 mg IV q8 hr with improved CrCl; target VT 15-20 mcg/mL (this is above nomogram dose, but note patient had low VT last year with identical CrCl on 750 mg q8 hr dosing - hesitant to adjust to 1g q8 immediately as patient will likely require prolonged therapy)  Cefepime stopped by ID physician  Daily SCr   Height: 5\' 3"  (160 cm) Weight: 126 lb 12.2 oz (57.5 kg) IBW/kg (Calculated) : 52.4  Temp (24hrs), Avg:97.9 F (36.6 C), Min:97.3 F (36.3 C), Max:98.8 F (37.1 C)   Recent Labs Lab 11/10/16 1342 11/10/16 1457 11/10/16 1733 11/10/16 2119 11/11/16 0306  WBC 14.5*  --   --  12.8* 10.5  CREATININE 1.36*  --   --  1.04* 0.68  LATICACIDVEN  --  3.26* 2.11* 1.9 1.6    Estimated Creatinine Clearance: 88.2 mL/min (by C-G formula based on SCr of 0.68 mg/dL).    No Known Allergies  Antimicrobials this admission: 5/26 vanc >> 5/26 Zosyn x 1 5/26 cefepime >> 5/27  Dose adjustments this admission: 5/27: increase vanc to 750 q8 with improved renal function  Microbiology results: 5/26 BCx: 2/2 GPCs in clusters (3/3 bottles; BCID = MRSA) 5/26 MRSA PCR: positive  Thank you for allowing pharmacy to be a part of this patient's care.   Bernadene Personrew Elisabeth Strom, PharmD, BCPS Pager: [phone] 11/11/2016, 9:35 AM

## 2016-11-11 NOTE — Progress Notes (Signed)
Patient was visited by her fiance around 2245. Fiance closed the door and stayed for 10 min.and then left. RN went in to check on patient and noticed that the fiance had removed her mitts and restraints.  Restraints reapplied.

## 2016-11-11 NOTE — Progress Notes (Signed)
eLink Physician-Brief Progress Note Patient Name: Arville LimeVictoria L Robicheaux DOB: [birth year] MRN: [id number]   Date of Service  11/11/2016  HPI/Events of Note  CXR done with R IJ tip at the RA  eICU Interventions  OK to use line     Intervention Category Major Interventions: Other:  Daneen SchickJose Angelo A De Dios 11/11/2016, 12:44 AM

## 2016-11-11 NOTE — Progress Notes (Signed)
PHARMACY - PHYSICIAN COMMUNICATION CRITICAL VALUE ALERT - BLOOD CULTURE IDENTIFICATION (BCID)  Results for orders placed or performed during the hospital encounter of 11/10/16  Blood Culture ID Panel (Reflexed) (Collected: 11/10/2016  2:55 PM)  Result Value Ref Range   Enterococcus species NOT DETECTED NOT DETECTED   Listeria monocytogenes NOT DETECTED NOT DETECTED   Staphylococcus species DETECTED (A) NOT DETECTED   Staphylococcus aureus DETECTED (A) NOT DETECTED   Methicillin resistance DETECTED (A) NOT DETECTED   Streptococcus species NOT DETECTED NOT DETECTED   Streptococcus agalactiae NOT DETECTED NOT DETECTED   Streptococcus pneumoniae NOT DETECTED NOT DETECTED   Streptococcus pyogenes NOT DETECTED NOT DETECTED   Acinetobacter baumannii NOT DETECTED NOT DETECTED   Enterobacteriaceae species NOT DETECTED NOT DETECTED   Enterobacter cloacae complex NOT DETECTED NOT DETECTED   Escherichia coli NOT DETECTED NOT DETECTED   Klebsiella oxytoca NOT DETECTED NOT DETECTED   Klebsiella pneumoniae NOT DETECTED NOT DETECTED   Proteus species NOT DETECTED NOT DETECTED   Serratia marcescens NOT DETECTED NOT DETECTED   Haemophilus influenzae NOT DETECTED NOT DETECTED   Neisseria meningitidis NOT DETECTED NOT DETECTED   Pseudomonas aeruginosa NOT DETECTED NOT DETECTED   Candida albicans NOT DETECTED NOT DETECTED   Candida glabrata NOT DETECTED NOT DETECTED   Candida krusei NOT DETECTED NOT DETECTED   Candida parapsilosis NOT DETECTED NOT DETECTED   Candida tropicalis NOT DETECTED NOT DETECTED    Name of physician (or Provider) Contacted: Dr, Christene Slatesde Dios  Changes to prescribed antibiotics required: No change, already on vancomycin  Savannah Palmer, Savannah Palmer 11/11/2016  6:19 AM

## 2016-11-11 NOTE — Progress Notes (Signed)
CRITICAL VALUE ALERT  Critical Value:  MRSA swab positive  Date & Time Notied:  11/11/2016 0015  Provider Notified: Pola CornElink   Orders Received/Actions taken: MRSA positive order set initiated

## 2016-11-11 NOTE — Progress Notes (Addendum)
9-cigarettes, 1 Red lighter, 3-Suboxone 8mg /2mg , and 8 yellow rectangle pills with R039 (pills identified by Pharmacist @ 2mg  Xanax) removed from patient possession by Marijo SanesKara Brooks, RN. Items taken from patient and locked up in pharmacy (meds) and security (cigarettes & lighter) for safe keeping.

## 2016-11-11 NOTE — Consult Note (Signed)
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 Halford Chessman.   HPI: Savannah Palmer is an 26 y.o. 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 She was found to be in septic shock requiring pressors. She was placed on broad spectrum antiiotics. CXR and CT show multifocal cavitary PNA c/w septic emboli from right sided endocarditis. Her blood cultures are + for MRSA by "biofire."  She has been narrowed to vancomycin. She still has central line from admission.  She says she has no other pains other than her pleurisy.     Past Medical History:  Diagnosis Date  . Anxiety   . Anxiety   . Depression     Past Surgical History:  Procedure Laterality Date  . CHOLECYSTECTOMY      Social History:  reports that she has been smoking.  She has been smoking about 1.00 pack per day. She has never used smokeless tobacco. She reports that she drinks alcohol. She reports that she uses drugs, including Methamphetamines and Cocaine.   Family History  Problem Relation Age of Onset  . Tuberculosis Neg Hx   . Diabetes Mellitus II Neg Hx     No Known Allergies   Medications: I have reviewed patients current medications as documented in Epic Anti-infectives    Start     Dose/Rate Route Frequency Ordered Stop   11/11/16 1100  vancomycin (VANCOCIN) IVPB 750 mg/150 ml premix     750 mg 150 mL/hr over 60 Minutes Intravenous Every 8 hours 11/11/16 0956     11/11/16 0400  vancomycin (VANCOCIN) 500 mg in sodium chloride 0.9 % 100 mL IVPB  Status:  Discontinued     500 mg 100 mL/hr over 60 Minutes Intravenous Every 12 hours 11/10/16 1951 11/11/16 0956   11/10/16 2200  ceFEPIme (MAXIPIME) 1 g in dextrose 5 % 50 mL IVPB  Status:   Discontinued     1 g 100 mL/hr over 30 Minutes Intravenous Every 12 hours 11/10/16 1951 11/11/16 0926   11/10/16 2000  ceFEPIme (MAXIPIME) 2 g in dextrose 5 % 50 mL IVPB  Status:  Discontinued     2 g 100 mL/hr over 30 Minutes Intravenous  Once 11/10/16 1952 11/10/16 1954   11/10/16 2000  vancomycin (VANCOCIN) IVPB 1000 mg/200 mL premix  Status:  Discontinued     1,000 mg 200 mL/hr over 60 Minutes Intravenous  Once 11/10/16 1952 11/10/16 1954   11/10/16 1430  piperacillin-tazobactam (ZOSYN) IVPB 3.375 g     3.375 g 100 mL/hr over 30 Minutes Intravenous  Once 11/10/16 1426 11/10/16 1503   11/10/16 1430  vancomycin (VANCOCIN) IVPB 1000 mg/200 mL premix     1,000 mg 200 mL/hr over 60 Minutes Intravenous  Once 11/10/16 1426 11/10/16 1653         ROS as in HPI otherwise remainder of 12 point Review of Systems is negative  Blood pressure 124/84, pulse (!) 120, temperature 98.4 F (36.9 C), temperature source Oral, resp. rate (!) 34, height 5' 3"  (1.6 m), weight 126 lb 12.2 oz (57.5 kg), last menstrual period 10/27/2016, SpO2 95 %. General: Alert and awake, oriented x3, not in any acute distress.wearing restraints HEENT: anicteric sclera,  EOMI, oropharynx clear and without exudate, face  is flushed, IJ in place Cardiovascular: tachycardic  rate, normal r,  no murmur rubs or gallops Pulmonary: clear to auscultation bilaterally, anteriorly no wheezing, rales or rhonchi Gastrointestinal: soft nontender, nondistended, normal bowel sounds, Musculoskeletal: no  clubbing or edema noted bilaterally,   Skin, soft tissue: no rashes, she has multiple track marks  Neuro: nonfocal, strength and sensation intact   Results for orders placed or performed during the hospital encounter of 11/10/16 (from the past 48 hour(s))  Basic metabolic panel     Status: Abnormal   Collection Time: 11/10/16  1:42 PM  Result Value Ref Range   Sodium 129 (L) 135 - 145 mmol/L   Potassium 3.2 (L) 3.5 - 5.1 mmol/L    Chloride 91 (L) 101 - 111 mmol/L   CO2 24 22 - 32 mmol/L   Glucose, Bld 127 (H) 65 - 99 mg/dL   BUN 34 (H) 6 - 20 mg/dL   Creatinine, Ser 1.36 (H) 0.44 - 1.00 mg/dL   Calcium 8.5 (L) 8.9 - 10.3 mg/dL   GFR calc non Af Amer 53 (L) >60 mL/min   GFR calc Af Amer >60 >60 mL/min    Comment: (NOTE) The eGFR has been calculated using the CKD EPI equation. This calculation has not been validated in all clinical situations. eGFR's persistently <60 mL/min signify possible Chronic Kidney Disease.    Anion gap 14 5 - 15  CBC     Status: Abnormal   Collection Time: 11/10/16  1:42 PM  Result Value Ref Range   WBC 14.5 (H) 4.0 - 10.5 K/uL   RBC 3.93 3.87 - 5.11 MIL/uL   Hemoglobin 11.2 (L) 12.0 - 15.0 g/dL   HCT 32.5 (L) 36.0 - 46.0 %   MCV 82.7 78.0 - 100.0 fL   MCH 28.5 26.0 - 34.0 pg   MCHC 34.5 30.0 - 36.0 g/dL   RDW 14.3 11.5 - 15.5 %   Platelets 119 (L) 150 - 400 K/uL    Comment: RESULT REPEATED AND VERIFIED SPECIMEN CHECKED FOR CLOTS PLATELET COUNT CONFIRMED BY SMEAR   Differential     Status: Abnormal   Collection Time: 11/10/16  1:42 PM  Result Value Ref Range   Neutrophils Relative % 85 %   Lymphocytes Relative 7 %   Monocytes Relative 8 %   Eosinophils Relative 0 %   Basophils Relative 0 %   Neutro Abs 12.3 (H) 1.7 - 7.7 K/uL   Lymphs Abs 1.0 0.7 - 4.0 K/uL   Monocytes Absolute 1.2 (H) 0.1 - 1.0 K/uL   Eosinophils Absolute 0.0 0.0 - 0.7 K/uL   Basophils Absolute 0.0 0.0 - 0.1 K/uL   WBC Morphology VACUOLATED NEUTROPHILS   Hepatic function panel     Status: Abnormal   Collection Time: 11/10/16  1:42 PM  Result Value Ref Range   Total Protein 6.6 6.5 - 8.1 g/dL   Albumin 2.8 (L) 3.5 - 5.0 g/dL   AST 38 15 - 41 U/L   ALT 20 14 - 54 U/L   Alkaline Phosphatase 249 (H) 38 - 126 U/L   Total Bilirubin 1.0 0.3 - 1.2 mg/dL   Bilirubin, Direct 0.5 0.1 - 0.5 mg/dL   Indirect Bilirubin 0.5 0.3 - 0.9 mg/dL  Magnesium     Status: Abnormal   Collection Time: 11/10/16  1:50 PM    Result Value Ref Range   Magnesium 1.5 (L) 1.7 - 2.4 mg/dL  POCT i-Stat troponin I     Status: None  Collection Time: 11/10/16  1:51 PM  Result Value Ref Range   Troponin i, poc 0.00 0.00 - 0.08 ng/mL   Comment 3            Comment: Due to the release kinetics of cTnI, a negative result within the first hours of the onset of symptoms does not rule out myocardial infarction with certainty. If myocardial infarction is still suspected, repeat the test at appropriate intervals.   Urinalysis, Routine w reflex microscopic     Status: Abnormal   Collection Time: 11/10/16  2:25 PM  Result Value Ref Range   Color, Urine AMBER (A) YELLOW    Comment: BIOCHEMICALS MAY BE AFFECTED BY COLOR   APPearance CLOUDY (A) CLEAR   Specific Gravity, Urine 1.017 1.005 - 1.030   pH 5.0 5.0 - 8.0   Glucose, UA 50 (A) NEGATIVE mg/dL   Hgb urine dipstick MODERATE (A) NEGATIVE   Bilirubin Urine SMALL (A) NEGATIVE   Ketones, ur NEGATIVE NEGATIVE mg/dL   Protein, ur 100 (A) NEGATIVE mg/dL   Nitrite NEGATIVE NEGATIVE   Leukocytes, UA MODERATE (A) NEGATIVE   RBC / HPF TOO NUMEROUS TO COUNT 0 - 5 RBC/hpf   WBC, UA TOO NUMEROUS TO COUNT 0 - 5 WBC/hpf   Bacteria, UA MANY (A) NONE SEEN   Squamous Epithelial / LPF 0-5 (A) NONE SEEN   Mucous PRESENT    Hyaline Casts, UA PRESENT   Blood Culture (routine x 2)     Status: None (Preliminary result)   Collection Time: 11/10/16  2:45 PM  Result Value Ref Range   Specimen Description BLOOD LEFT HAND    Special Requests IN PEDIATRIC BOTTLE Blood Culture adequate volume    Culture  Setup Time PED GRAM POSITIVE COCCI IN CLUSTERS    Culture      NO GROWTH < 24 HOURS Performed at [facility], [address] [place], [place] [address]    Report Status PENDING   Blood Culture (routine x 2)     Status: None (Preliminary result)   Collection Time: 11/10/16  2:55 PM  Result Value Ref Range   Specimen Description BLOOD LEFT ARM    Special Requests      BOTTLES  DRAWN AEROBIC AND ANAEROBIC Blood Culture adequate volume   Culture  Setup Time      IN BOTH AEROBIC AND ANAEROBIC BOTTLES GRAM POSITIVE COCCI IN CLUSTERS CRITICAL RESULT CALLED TO, READ BACK BY AND VERIFIED WITH: TO JGRIMSLEY(PHARMd) BY TCLEVELAND 11/11/2016 AT 6:15AM Performed at [facility], [place] [address] [place], [place] [address]    Culture GRAM POSITIVE COCCI    Report Status PENDING   Blood Culture ID Panel (Reflexed)     Status: Abnormal   Collection Time: 11/10/16  2:55 PM  Result Value Ref Range   Enterococcus species NOT DETECTED NOT DETECTED   Listeria monocytogenes NOT DETECTED NOT DETECTED   Staphylococcus species DETECTED (A) NOT DETECTED    Comment: CRITICAL RESULT CALLED TO, READ BACK BY AND VERIFIED WITH: TO JGRIMSLEY(PHARMd) BY TCLEVELAND 11/11/2016 AT 6:15AM    Staphylococcus aureus DETECTED (A) NOT DETECTED    Comment: Methicillin (oxacillin)-resistant Staphylococcus aureus (MRSA). MRSA is predictably resistant to beta-lactam antibiotics (except ceftaroline). Preferred therapy is vancomycin unless clinically contraindicated. Patient requires contact precautions if  hospitalized. CRITICAL RESULT CALLED TO, READ BACK BY AND VERIFIED WITH: TO JGRIMSLEY(PHARMd) BY TCLEVELAND 11/11/2016 AT 6:15AM    Methicillin resistance DETECTED (A) NOT DETECTED    Comment:  CRITICAL RESULT CALLED TO, READ BACK BY AND VERIFIED WITH: TO JGRIMSLEY(PHARMd) BY TCLEVELAND 11/11/2016 AT 6:15AM    Streptococcus species NOT DETECTED NOT DETECTED   Streptococcus agalactiae NOT DETECTED NOT DETECTED   Streptococcus pneumoniae NOT DETECTED NOT DETECTED   Streptococcus pyogenes NOT DETECTED NOT DETECTED   Acinetobacter baumannii NOT DETECTED NOT DETECTED   Enterobacteriaceae species NOT DETECTED NOT DETECTED   Enterobacter cloacae complex NOT DETECTED NOT DETECTED   Escherichia coli NOT DETECTED NOT DETECTED   Klebsiella oxytoca NOT DETECTED NOT DETECTED   Klebsiella pneumoniae NOT  DETECTED NOT DETECTED   Proteus species NOT DETECTED NOT DETECTED   Serratia marcescens NOT DETECTED NOT DETECTED   Haemophilus influenzae NOT DETECTED NOT DETECTED   Neisseria meningitidis NOT DETECTED NOT DETECTED   Pseudomonas aeruginosa NOT DETECTED NOT DETECTED   Candida albicans NOT DETECTED NOT DETECTED   Candida glabrata NOT DETECTED NOT DETECTED   Candida krusei NOT DETECTED NOT DETECTED   Candida parapsilosis NOT DETECTED NOT DETECTED   Candida tropicalis NOT DETECTED NOT DETECTED    Comment: Performed at [facility], [place] [address] [place], [place] [address]  CG4 I-STAT (Lactic acid)     Status: Abnormal   Collection Time: 11/10/16  2:57 PM  Result Value Ref Range   Lactic Acid, Venous 3.26 (HH) 0.5 - 1.9 mmol/L   Comment NOTIFIED PHYSICIAN   Pregnancy, urine     Status: None   Collection Time: 11/10/16  3:19 PM  Result Value Ref Range   Preg Test, Ur NEGATIVE NEGATIVE    Comment:        THE SENSITIVITY OF THIS METHODOLOGY IS >20 mIU/mL.   Blood gas, arterial     Status: Abnormal   Collection Time: 11/10/16  4:40 PM  Result Value Ref Range   O2 Content ROOM AIR L/min   pH, Arterial 7.426 7.350 - 7.450   pCO2 arterial 35.8 32.0 - 48.0 mmHg   pO2, Arterial 66.6 (L) 83.0 - 108.0 mmHg   Bicarbonate 23.1 20.0 - 28.0 mmol/L   Acid-base deficit 0.5 0.0 - 2.0 mmol/L   O2 Saturation 93.1 %   Patient temperature 98.6    Collection site BRACHIAL ARTERY    Drawn by [id number]    Sample type ARTERIAL DRAW    Allens test (pass/fail) PASS PASS  CG4 I-STAT (Lactic acid)     Status: Abnormal   Collection Time: 11/10/16  5:33 PM  Result Value Ref Range   Lactic Acid, Venous 2.11 (HH) 0.5 - 1.9 mmol/L   Comment NOTIFIED PHYSICIAN   Type and screen [place]     Status: None   Collection Time: 11/10/16  7:52 PM  Result Value Ref Range   ABO/RH(D) O POS    Antibody Screen NEG    Sample Expiration 11/13/2016   Glucose, capillary     Status: Abnormal    Collection Time: 11/10/16  9:18 PM  Result Value Ref Range   Glucose-Capillary 119 (H) 65 - 99 mg/dL   Comment 1 Notify RN   CBC     Status: Abnormal   Collection Time: 11/10/16  9:19 PM  Result Value Ref Range   WBC 12.8 (H) 4.0 - 10.5 K/uL   RBC 3.39 (L) 3.87 - 5.11 MIL/uL   Hemoglobin 9.6 (L) 12.0 - 15.0 g/dL   HCT 28.2 (L) 36.0 - 46.0 %   MCV 83.2 78.0 - 100.0 fL   MCH 28.3 26.0 - 34.0 pg  MCHC 34.0 30.0 - 36.0 g/dL   RDW 14.6 11.5 - 15.5 %   Platelets 104 (L) 150 - 400 K/uL    Comment: CONSISTENT WITH PREVIOUS RESULT  Comprehensive metabolic panel     Status: Abnormal   Collection Time: 11/10/16  9:19 PM  Result Value Ref Range   Sodium 137 135 - 145 mmol/L    Comment: DELTA CHECK NOTED REPEATED TO VERIFY    Potassium 3.1 (L) 3.5 - 5.1 mmol/L   Chloride 107 101 - 111 mmol/L   CO2 23 22 - 32 mmol/L   Glucose, Bld 128 (H) 65 - 99 mg/dL   BUN 29 (H) 6 - 20 mg/dL   Creatinine, Ser 1.04 (H) 0.44 - 1.00 mg/dL   Calcium 7.4 (L) 8.9 - 10.3 mg/dL   Total Protein 5.0 (L) 6.5 - 8.1 g/dL   Albumin 2.1 (L) 3.5 - 5.0 g/dL   AST 25 15 - 41 U/L   ALT 16 14 - 54 U/L   Alkaline Phosphatase 164 (H) 38 - 126 U/L   Total Bilirubin 0.9 0.3 - 1.2 mg/dL   GFR calc non Af Amer >60 >60 mL/min   GFR calc Af Amer >60 >60 mL/min    Comment: (NOTE) The eGFR has been calculated using the CKD EPI equation. This calculation has not been validated in all clinical situations. eGFR's persistently <60 mL/min signify possible Chronic Kidney Disease.    Anion gap 7 5 - 15  Lactic acid, plasma     Status: None   Collection Time: 11/10/16  9:19 PM  Result Value Ref Range   Lactic Acid, Venous 1.9 0.5 - 1.9 mmol/L  Cortisol     Status: None   Collection Time: 11/10/16  9:19 PM  Result Value Ref Range   Cortisol, Plasma 99.0 ug/dL    Comment: RESULTS CONFIRMED BY MANUAL DILUTION (NOTE) AM    6.7 - 22.6 ug/dL PM   <10.0       ug/dL Performed at [facility] [address]  [place], [place] [address]   Troponin I     Status: Abnormal   Collection Time: 11/10/16  9:19 PM  Result Value Ref Range   Troponin I 0.05 (HH) <0.03 ng/mL    Comment: CRITICAL RESULT CALLED TO, READ BACK BY AND VERIFIED WITH: Orlin Hilding 2208 11/10/16 A NAVARRO   Protime-INR     Status: None   Collection Time: 11/10/16  9:19 PM  Result Value Ref Range   Prothrombin Time 14.9 11.4 - 15.2 seconds   INR 1.16   Procalcitonin     Status: None   Collection Time: 11/10/16  9:19 PM  Result Value Ref Range   Procalcitonin 3.16 ng/mL    Comment:        Interpretation: PCT > 2 ng/mL: Systemic infection (sepsis) is likely, unless other causes are known. (NOTE)         ICU PCT Algorithm               Non ICU PCT Algorithm    ----------------------------     ------------------------------         PCT < 0.25 ng/mL                 PCT < 0.1 ng/mL     Stopping of antibiotics            Stopping of antibiotics       strongly encouraged.  strongly encouraged.    ----------------------------     ------------------------------       PCT level decrease by               PCT < 0.25 ng/mL       >= 80% from peak PCT       OR PCT 0.25 - 0.5 ng/mL          Stopping of antibiotics                                             encouraged.     Stopping of antibiotics           encouraged.    ----------------------------     ------------------------------       PCT level decrease by              PCT >= 0.25 ng/mL       < 80% from peak PCT        AND PCT >= 0.5 ng/mL            Continuing antibiotics                                               encouraged.       Continuing antibiotics            encouraged.    ----------------------------     ------------------------------     PCT level increase compared          PCT > 0.5 ng/mL         with peak PCT AND          PCT >= 0.5 ng/mL             Escalation of antibiotics                                          strongly encouraged.      Escalation  of antibiotics        strongly encouraged.   APTT     Status: None   Collection Time: 11/10/16  9:19 PM  Result Value Ref Range   aPTT 29 24 - 36 seconds  ABO/Rh     Status: None   Collection Time: 11/10/16  9:19 PM  Result Value Ref Range   ABO/RH(D) O POS   Urinalysis, Routine w reflex microscopic     Status: Abnormal   Collection Time: 11/10/16  9:32 PM  Result Value Ref Range   Color, Urine YELLOW YELLOW   APPearance CLEAR CLEAR   Specific Gravity, Urine 1.006 1.005 - 1.030   pH 5.0 5.0 - 8.0   Glucose, UA NEGATIVE NEGATIVE mg/dL   Hgb urine dipstick MODERATE (A) NEGATIVE   Bilirubin Urine NEGATIVE NEGATIVE   Ketones, ur NEGATIVE NEGATIVE mg/dL   Protein, ur NEGATIVE NEGATIVE mg/dL   Nitrite NEGATIVE NEGATIVE   Leukocytes, UA SMALL (A) NEGATIVE   RBC / HPF 6-30 0 - 5 RBC/hpf   WBC, UA 6-30 0 - 5 WBC/hpf   Bacteria, UA FEW (A) NONE SEEN   Squamous Epithelial / LPF 0-5 (A) NONE SEEN  Strep pneumoniae urinary antigen  (not at [facility])     Status: None   Collection Time: 11/10/16  9:32 PM  Result Value Ref Range   Strep Pneumo Urinary Antigen NEGATIVE NEGATIVE    Comment: PERFORMED AT [facility]        Infection due to S. pneumoniae cannot be absolutely ruled out since the antigen present may be below the detection limit of the test. Performed at [facility], [address] [place], [place] [address]   Culture, blood (x 2)     Status: None (Preliminary result)   Collection Time: 11/10/16  9:46 PM  Result Value Ref Range   Specimen Description BLOOD LEFT ANTECUBITAL    Special Requests IN PEDIATRIC BOTTLE Blood Culture adequate volume    Culture  Setup Time      GRAM POSITIVE COCCI IN CLUSTERS IN PEDIATRIC BOTTLE CRITICAL VALUE NOTED.  VALUE IS CONSISTENT WITH PREVIOUSLY REPORTED AND CALLED VALUE. Performed at [facility], [place] [address], [place], [place] [address]    Culture GRAM POSITIVE COCCI    Report Status PENDING   Culture, blood (x 2)      Status: None (Preliminary result)   Collection Time: 11/10/16  9:46 PM  Result Value Ref Range   Specimen Description BLOOD LEFT HAND    Special Requests IN PEDIATRIC BOTTLE Blood Culture adequate volume    Culture  Setup Time      GRAM POSITIVE COCCI IN CLUSTERS IN PEDIATRIC BOTTLE CRITICAL RESULT CALLED TO, READ BACK BY AND VERIFIED WITH: D WOFFORD,PHARMD AT 1353 11/11/16 BY L BENFIELD Performed at [facility], [place] [address], [place], [place] [address]    Culture GRAM POSITIVE COCCI    Report Status PENDING   MRSA PCR Screening     Status: Abnormal   Collection Time: 11/10/16  9:58 PM  Result Value Ref Range   MRSA by PCR POSITIVE (A) NEGATIVE    Comment:        The GeneXpert MRSA Assay (FDA approved for NASAL specimens only), is one component of a comprehensive MRSA colonization surveillance program. It is not intended to diagnose MRSA infection nor to guide or monitor treatment for MRSA infections. RESULT CALLED TO, READ BACK BY AND VERIFIED WITH: ABBY CHAVEZ,RN [id number] @ 2423 BY J SCOTTON   Glucose, capillary     Status: Abnormal   Collection Time: 11/11/16  1:00 AM  Result Value Ref Range   Glucose-Capillary 190 (H) 65 - 99 mg/dL   Comment 1 Notify RN   Lactic acid, plasma     Status: None   Collection Time: 11/11/16  3:06 AM  Result Value Ref Range   Lactic Acid, Venous 1.6 0.5 - 1.9 mmol/L  CBC     Status: Abnormal   Collection Time: 11/11/16  3:06 AM  Result Value Ref Range   WBC 10.5 4.0 - 10.5 K/uL   RBC 3.20 (L) 3.87 - 5.11 MIL/uL   Hemoglobin 9.0 (L) 12.0 - 15.0 g/dL   HCT 26.4 (L) 36.0 - 46.0 %   MCV 82.5 78.0 - 100.0 fL   MCH 28.1 26.0 - 34.0 pg   MCHC 34.1 30.0 - 36.0 g/dL   RDW 14.4 11.5 - 15.5 %   Platelets 91 (L) 150 - 400 K/uL    Comment: CONSISTENT WITH PREVIOUS RESULT  Basic metabolic panel     Status: Abnormal   Collection Time: 11/11/16  3:06 AM  Result Value Ref Range  Sodium 138 135 - 145 mmol/L   Potassium 3.3 (L) 3.5 - 5.1  mmol/L   Chloride 108 101 - 111 mmol/L   CO2 23 22 - 32 mmol/L   Glucose, Bld 175 (H) 65 - 99 mg/dL   BUN 24 (H) 6 - 20 mg/dL   Creatinine, Ser 0.68 0.44 - 1.00 mg/dL   Calcium 7.6 (L) 8.9 - 10.3 mg/dL   GFR calc non Af Amer >60 >60 mL/min   GFR calc Af Amer >60 >60 mL/min    Comment: (NOTE) The eGFR has been calculated using the CKD EPI equation. This calculation has not been validated in all clinical situations. eGFR's persistently <60 mL/min signify possible Chronic Kidney Disease.    Anion gap 7 5 - 15  Phosphorus     Status: Abnormal   Collection Time: 11/11/16  3:06 AM  Result Value Ref Range   Phosphorus 1.9 (L) 2.5 - 4.6 mg/dL  Magnesium     Status: Abnormal   Collection Time: 11/11/16  3:06 AM  Result Value Ref Range   Magnesium 2.6 (H) 1.7 - 2.4 mg/dL  Blood gas, arterial     Status: Abnormal   Collection Time: 11/11/16  4:15 AM  Result Value Ref Range   O2 Content 2.0 L/min   Delivery systems NASAL CANNULA    pH, Arterial 7.442 7.350 - 7.450   pCO2 arterial 31.9 (L) 32.0 - 48.0 mmHg   pO2, Arterial 128 (H) 83.0 - 108.0 mmHg   Bicarbonate 21.6 20.0 - 28.0 mmol/L   Acid-base deficit 1.7 0.0 - 2.0 mmol/L   O2 Saturation 98.9 %   Patient temperature 97.5    Collection site RIGHT RADIAL    Drawn by [phone]    Sample type ARTERIAL DRAW    Allens test (pass/fail) PASS PASS  Glucose, capillary     Status: Abnormal   Collection Time: 11/11/16  4:19 AM  Result Value Ref Range   Glucose-Capillary 146 (H) 65 - 99 mg/dL  Glucose, capillary     Status: Abnormal   Collection Time: 11/11/16  7:55 AM  Result Value Ref Range   Glucose-Capillary 126 (H) 65 - 99 mg/dL   Comment 1 Notify RN    Comment 2 Document in Chart   Glucose, capillary     Status: Abnormal   Collection Time: 11/11/16 11:56 AM  Result Value Ref Range   Glucose-Capillary 148 (H) 65 - 99 mg/dL   Comment 1 Notify RN    Comment 2 Document in Chart   Glucose, capillary     Status: Abnormal   Collection  Time: 11/11/16  4:29 PM  Result Value Ref Range   Glucose-Capillary 184 (H) 65 - 99 mg/dL   Comment 1 Notify RN    Comment 2 Document in Chart    @BRIEFLABTABLE (sdes,specrequest,cult,reptstatus)   ) Recent Results (from the past 720 hour(s))  Blood Culture (routine x 2)     Status: None (Preliminary result)   Collection Time: 11/10/16  2:45 PM  Result Value Ref Range Status   Specimen Description BLOOD LEFT HAND  Final   Special Requests IN PEDIATRIC BOTTLE Blood Culture adequate volume  Final   Culture  Setup Time PED GRAM POSITIVE COCCI IN CLUSTERS  Final   Culture   Final    NO GROWTH < 24 HOURS Performed at [facility], [address], [place],  [address]    Report Status PENDING  Incomplete  Blood Culture (routine x 2)  Status: None (Preliminary result)   Collection Time: 11/10/16  2:55 PM  Result Value Ref Range Status   Specimen Description BLOOD LEFT ARM  Final   Special Requests   Final    BOTTLES DRAWN AEROBIC AND ANAEROBIC Blood Culture adequate volume   Culture  Setup Time   Final    IN BOTH AEROBIC AND ANAEROBIC BOTTLES GRAM POSITIVE COCCI IN CLUSTERS CRITICAL RESULT CALLED TO, READ BACK BY AND VERIFIED WITH: TO JGRIMSLEY(PHARMd) BY TCLEVELAND 11/11/2016 AT 6:15AM Performed at [facility], [place] [address] [place], [place] [address]    Culture GRAM POSITIVE COCCI  Final   Report Status PENDING  Incomplete  Blood Culture ID Panel (Reflexed)     Status: Abnormal   Collection Time: 11/10/16  2:55 PM  Result Value Ref Range Status   Enterococcus species NOT DETECTED NOT DETECTED Final   Listeria monocytogenes NOT DETECTED NOT DETECTED Final   Staphylococcus species DETECTED (A) NOT DETECTED Final    Comment: CRITICAL RESULT CALLED TO, READ BACK BY AND VERIFIED WITH: TO JGRIMSLEY(PHARMd) BY TCLEVELAND 11/11/2016 AT 6:15AM    Staphylococcus aureus DETECTED (A) NOT DETECTED Final    Comment: Methicillin (oxacillin)-resistant Staphylococcus  aureus (MRSA). MRSA is predictably resistant to beta-lactam antibiotics (except ceftaroline). Preferred therapy is vancomycin unless clinically contraindicated. Patient requires contact precautions if  hospitalized. CRITICAL RESULT CALLED TO, READ BACK BY AND VERIFIED WITH: TO JGRIMSLEY(PHARMd) BY TCLEVELAND 11/11/2016 AT 6:15AM    Methicillin resistance DETECTED (A) NOT DETECTED Final    Comment: CRITICAL RESULT CALLED TO, READ BACK BY AND VERIFIED WITH: TO JGRIMSLEY(PHARMd) BY TCLEVELAND 11/11/2016 AT 6:15AM    Streptococcus species NOT DETECTED NOT DETECTED Final   Streptococcus agalactiae NOT DETECTED NOT DETECTED Final   Streptococcus pneumoniae NOT DETECTED NOT DETECTED Final   Streptococcus pyogenes NOT DETECTED NOT DETECTED Final   Acinetobacter baumannii NOT DETECTED NOT DETECTED Final   Enterobacteriaceae species NOT DETECTED NOT DETECTED Final   Enterobacter cloacae complex NOT DETECTED NOT DETECTED Final   Escherichia coli NOT DETECTED NOT DETECTED Final   Klebsiella oxytoca NOT DETECTED NOT DETECTED Final   Klebsiella pneumoniae NOT DETECTED NOT DETECTED Final   Proteus species NOT DETECTED NOT DETECTED Final   Serratia marcescens NOT DETECTED NOT DETECTED Final   Haemophilus influenzae NOT DETECTED NOT DETECTED Final   Neisseria meningitidis NOT DETECTED NOT DETECTED Final   Pseudomonas aeruginosa NOT DETECTED NOT DETECTED Final   Candida albicans NOT DETECTED NOT DETECTED Final   Candida glabrata NOT DETECTED NOT DETECTED Final   Candida krusei NOT DETECTED NOT DETECTED Final   Candida parapsilosis NOT DETECTED NOT DETECTED Final   Candida tropicalis NOT DETECTED NOT DETECTED Final    Comment: Performed at [facility], [place]. [address], [place], [place] [address]  Culture, blood (x 2)     Status: None (Preliminary result)   Collection Time: 11/10/16  9:46 PM  Result Value Ref Range Status   Specimen Description BLOOD LEFT ANTECUBITAL  Final   Special Requests IN  PEDIATRIC BOTTLE Blood Culture adequate volume  Final   Culture  Setup Time   Final    GRAM POSITIVE COCCI IN CLUSTERS IN PEDIATRIC BOTTLE CRITICAL VALUE NOTED.  VALUE IS CONSISTENT WITH PREVIOUSLY REPORTED AND CALLED VALUE. Performed at [facility], [place] [address] [place], [place] [address]    Culture GRAM POSITIVE COCCI  Final   Report Status PENDING  Incomplete  Culture, blood (x 2)  Status: None (Preliminary result)   Collection Time: 11/10/16  9:46 PM  Result Value Ref Range Status   Specimen Description BLOOD LEFT HAND  Final   Special Requests IN PEDIATRIC BOTTLE Blood Culture adequate volume  Final   Culture  Setup Time   Final    GRAM POSITIVE COCCI IN CLUSTERS IN PEDIATRIC BOTTLE CRITICAL RESULT CALLED TO, READ BACK BY AND VERIFIED WITH: D WOFFORD,PHARMD AT 1353 11/11/16 BY L BENFIELD Performed at [facility], [place] [address], [place], [place] [address]    Culture GRAM POSITIVE COCCI  Final   Report Status PENDING  Incomplete  MRSA PCR Screening     Status: Abnormal   Collection Time: 11/10/16  9:58 PM  Result Value Ref Range Status   MRSA by PCR POSITIVE (A) NEGATIVE Final    Comment:        The GeneXpert MRSA Assay (FDA approved for NASAL specimens only), is one component of a comprehensive MRSA colonization surveillance program. It is not intended to diagnose MRSA infection nor to guide or monitor treatment for MRSA infections. RESULT CALLED TO, READ BACK BY AND VERIFIED WITH: ABBY CHAVEZ,RN [id number] @ [id number] BY J SCOTTON      Impression/Recommendation  Active Problems:   Septic shock ([place])   Savannah Palmer is a 26 y.o. 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      [facility] Staphylococcus aureus bacteremia   Staphylococcus aureus bacteremia (SAB) is associated with a high rate of complications and mortality.   Specific aspects of clinical management are critical to optimizing the outcome of patients with SAB.  Therefore, the [facility] Antimicrobial Management Team [facility]) has initiated an intervention aimed at improving the management of SAB at [facility].  To do so, Infectious Diseases physicians are providing an evidence-based consult for the management of all patients with SAB.     Yes No Comments  Perform follow-up blood cultures (even if the patient is afebrile) to ensure clearance of bacteremia [x]  []  Repeated today but   NEEDS REPEATING AFTER LINES OUT  Remove vascular catheter and obtain follow-up blood cultures after the removal of the catheter [x]  []  SHE NEEDS HER CENTRAL LINE OUT AS SOON AS CLINICALLY STABLE TO DO SO  Perform echocardiography to evaluate for endocarditis (transthoracic ECHO is 40-50% sensitive, TEE is > 90% sensitive) [x]  []  Please keep in mind, that neither test can definitively EXCLUDE endocarditis, and that should clinical suspicion remain high for endocarditis the patient should then still be treated with an "endocarditis" duration of therapy = 6 weeks  She undoubtedly has endocarditis. But would still pursue TEE and question of whether or not CVTS will need to be involved  Consult electrophysiologist to evaluate implanted cardiac device (pacemaker, ICD) []  []  NA  Ensure source control []  []  Have all abscesses been drained effectively? Have deep seeded infections (septic joints or osteomyelitis) had appropriate surgical debridement?  Source is IVDU  Investigate for "metastatic" sites of infection []  []  Does the patient have ANY symptom or physical exam finding that would suggest a deeper infection (back or neck pain that may be suggestive of vertebral osteomyelitis or epidural abscess, muscle pain that could be a symptom of pyomyositis)?  Keep in mind that for deep seeded infections MRI imaging with contrast is preferred rather than other often insensitive tests such as  plain x-rays, especially early  in a patient's presentation.  Lungs and heart seem to be main concern right now  Change antibiotic therapy to vancomycin [x]  []  Beta-lactam antibiotics are preferred for MSSA due to higher cure rates.   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 Will also check for viral hepatitis, HIV   11/11/2016, 7:54 PM   Thank you so much for this interesting consult  [place] for [place] [phone] (pager) [phone] (office) 11/11/2016, 7:54 PM  Rhina Br[place] 11/11/2016, 7:54 PM

## 2016-11-11 NOTE — Progress Notes (Signed)
eLink Physician-Brief Progress Note Patient Name: Arville LimeVictoria L Granquist DOB: 1990/11/24 MRN: [id number]   Date of Service  11/11/2016  HPI/Events of Note  Patient has a right IJ with its tip in the PA. IJ has been pulled out some. Need to verify position.   Patient is also agitated, encephalopathic, pulling her lines.   eICU Interventions  Stat chest x-ray   Bilateral wrist restraints.      Intervention Category Major Interventions: Other:  Daneen SchickJose Angelo A De Dios 11/11/2016, 12:11 AM

## 2016-11-12 DIAGNOSIS — I2690 Septic pulmonary embolism without acute cor pulmonale: Secondary | ICD-10-CM

## 2016-11-12 DIAGNOSIS — I38 Endocarditis, valve unspecified: Secondary | ICD-10-CM | POA: Diagnosis present

## 2016-11-12 DIAGNOSIS — E876 Hypokalemia: Secondary | ICD-10-CM

## 2016-11-12 DIAGNOSIS — R7881 Bacteremia: Secondary | ICD-10-CM

## 2016-11-12 DIAGNOSIS — D696 Thrombocytopenia, unspecified: Secondary | ICD-10-CM

## 2016-11-12 LAB — URINE CULTURE: Culture: NO GROWTH

## 2016-11-12 LAB — GLUCOSE, CAPILLARY
GLUCOSE-CAPILLARY: 113 mg/dL — AB (ref 65–99)
GLUCOSE-CAPILLARY: 116 mg/dL — AB (ref 65–99)
Glucose-Capillary: 104 mg/dL — ABNORMAL HIGH (ref 65–99)
Glucose-Capillary: 111 mg/dL — ABNORMAL HIGH (ref 65–99)
Glucose-Capillary: 117 mg/dL — ABNORMAL HIGH (ref 65–99)
Glucose-Capillary: 122 mg/dL — ABNORMAL HIGH (ref 65–99)
Glucose-Capillary: 125 mg/dL — ABNORMAL HIGH (ref 65–99)
Glucose-Capillary: 132 mg/dL — ABNORMAL HIGH (ref 65–99)

## 2016-11-12 LAB — CBC
HCT: 24.2 % — ABNORMAL LOW (ref 36.0–46.0)
Hemoglobin: 8.4 g/dL — ABNORMAL LOW (ref 12.0–15.0)
MCH: 28.7 pg (ref 26.0–34.0)
MCHC: 34.7 g/dL (ref 30.0–36.0)
MCV: 82.6 fL (ref 78.0–100.0)
PLATELETS: 108 10*3/uL — AB (ref 150–400)
RBC: 2.93 MIL/uL — ABNORMAL LOW (ref 3.87–5.11)
RDW: 14.7 % (ref 11.5–15.5)
WBC: 11.9 10*3/uL — ABNORMAL HIGH (ref 4.0–10.5)

## 2016-11-12 LAB — BASIC METABOLIC PANEL
Anion gap: 5 (ref 5–15)
BUN: 12 mg/dL (ref 6–20)
CALCIUM: 8.2 mg/dL — AB (ref 8.9–10.3)
CO2: 24 mmol/L (ref 22–32)
CREATININE: 0.59 mg/dL (ref 0.44–1.00)
Chloride: 113 mmol/L — ABNORMAL HIGH (ref 101–111)
GFR calc non Af Amer: >60 mL/min (ref >60)
Glucose, Bld: 147 mg/dL — ABNORMAL HIGH (ref 65–99)
Potassium: 3.3 mmol/L — ABNORMAL LOW (ref 3.5–5.1)
Sodium: 142 mmol/L (ref 135–145)

## 2016-11-12 LAB — LEGIONELLA PNEUMOPHILA SEROGP 1 UR AG: L. pneumophila Serogp 1 Ur Ag: NEGATIVE

## 2016-11-12 LAB — MAGNESIUM: Magnesium: 1.7 mg/dL (ref 1.7–2.4)

## 2016-11-12 LAB — PHOSPHORUS: PHOSPHORUS: 1.5 mg/dL — AB (ref 2.5–4.6)

## 2016-11-12 MED ORDER — LOPERAMIDE HCL 2 MG PO CAPS: 2.0000 mg | ORAL_CAPSULE | ORAL | Status: AC | PRN

## 2016-11-12 MED ORDER — DICYCLOMINE HCL 20 MG PO TABS
20.0000 mg | ORAL_TABLET | Freq: Four times a day (QID) | ORAL | Status: AC | PRN
  Administered 2016-11-13: 20 mg via ORAL
  Filled 2016-11-12 (×2): qty 1

## 2016-11-12 MED ORDER — MAGNESIUM SULFATE 2 GM/50ML IV SOLN
2.0000 g | Freq: Once | INTRAVENOUS | Status: AC
  Administered 2016-11-12: 2 g via INTRAVENOUS
  Filled 2016-11-12: qty 50

## 2016-11-12 MED ORDER — FENTANYL CITRATE (PF) 100 MCG/2ML IJ SOLN: 25.0000 ug | INTRAMUSCULAR | Status: DC | PRN

## 2016-11-12 MED ORDER — LORAZEPAM 2 MG/ML IJ SOLN
0.5000 mg | INTRAMUSCULAR | Status: DC | PRN
  Administered 2016-11-12: 1 mg via INTRAVENOUS
  Filled 2016-11-12: qty 1

## 2016-11-12 MED ORDER — CLONIDINE HCL 0.1 MG PO TABS: 0.1000 mg | ORAL_TABLET | ORAL | Status: DC

## 2016-11-12 MED ORDER — CLONIDINE HCL 0.1 MG PO TABS: 0.1000 mg | ORAL_TABLET | Freq: Four times a day (QID) | ORAL | Status: DC

## 2016-11-12 MED ORDER — METHOCARBAMOL 500 MG PO TABS
500.0000 mg | ORAL_TABLET | Freq: Three times a day (TID) | ORAL | Status: AC | PRN
  Administered 2016-11-12 – 2016-11-13 (×2): 500 mg via ORAL
  Filled 2016-11-12 (×2): qty 1

## 2016-11-12 MED ORDER — ALPRAZOLAM 1 MG PO TABS
1.0000 mg | ORAL_TABLET | Freq: Three times a day (TID) | ORAL | Status: DC | PRN
Start: 2016-11-12 — End: 2016-11-12

## 2016-11-12 MED ORDER — NAPROXEN 250 MG PO TABS
500.0000 mg | ORAL_TABLET | Freq: Two times a day (BID) | ORAL | Status: DC | PRN
  Administered 2016-11-12: 500 mg via ORAL
  Filled 2016-11-12: qty 2

## 2016-11-12 MED ORDER — ALPRAZOLAM 0.5 MG PO TABS
0.5000 mg | ORAL_TABLET | Freq: Three times a day (TID) | ORAL | Status: DC | PRN
  Administered 2016-11-12 – 2016-12-05 (×46): 0.5 mg via ORAL
  Filled 2016-11-12 (×46): qty 1

## 2016-11-12 MED ORDER — PANTOPRAZOLE SODIUM 40 MG PO TBEC
40.0000 mg | DELAYED_RELEASE_TABLET | Freq: Every day | ORAL | Status: DC
  Administered 2016-11-12 – 2016-11-13 (×2): 40 mg via ORAL
  Filled 2016-11-12 (×2): qty 1

## 2016-11-12 MED ORDER — NICOTINE 14 MG/24HR TD PT24
14.0000 mg | MEDICATED_PATCH | Freq: Every day | TRANSDERMAL | Status: DC
  Administered 2016-11-12 – 2016-12-30 (×49): 14 mg via TRANSDERMAL
  Filled 2016-11-12 (×49): qty 1

## 2016-11-12 MED ORDER — K PHOS MONO-SOD PHOS DI & MONO 155-852-130 MG PO TABS
500.0000 mg | ORAL_TABLET | Freq: Four times a day (QID) | ORAL | Status: AC
  Administered 2016-11-12 (×4): 500 mg via ORAL
  Filled 2016-11-12 (×4): qty 2

## 2016-11-12 MED ORDER — POTASSIUM CHLORIDE CRYS ER 20 MEQ PO TBCR
40.0000 meq | EXTENDED_RELEASE_TABLET | Freq: Two times a day (BID) | ORAL | Status: AC
  Administered 2016-11-12 (×2): 40 meq via ORAL
  Filled 2016-11-12 (×2): qty 2

## 2016-11-12 MED ORDER — CLONIDINE HCL 0.1 MG PO TABS: 0.1000 mg | ORAL_TABLET | Freq: Every day | ORAL | Status: DC

## 2016-11-12 MED ORDER — HYDROXYZINE HCL 25 MG PO TABS
25.0000 mg | ORAL_TABLET | Freq: Four times a day (QID) | ORAL | Status: AC | PRN
  Administered 2016-11-13: 25 mg via ORAL
  Filled 2016-11-12: qty 1

## 2016-11-12 MED ORDER — CHLORHEXIDINE GLUCONATE CLOTH 2 % EX PADS
6.0000 | MEDICATED_PAD | Freq: Every day | CUTANEOUS | Status: DC
  Administered 2016-11-12: 6 via TOPICAL

## 2016-11-12 MED ORDER — ONDANSETRON 4 MG PO TBDP: 4.0000 mg | ORAL_TABLET | Freq: Four times a day (QID) | ORAL | Status: AC | PRN

## 2016-11-12 NOTE — Care Management Note (Signed)
Case Management Note  Patient Details  Name: Savannah Palmer MRN: [id number] Date of Birth: [birth year]  Subjective/Objective:                  26 y.o.femalewith a history of anxiety, depression, and polysubstance abuse, who presents to the Emergency Department complaining of substance abuse that began earlier tonight. Patient reports taking both heroine and crack-cocaine at 12 AM tonight. Patient has no associated symptoms. No modifying factors indicated. Patient denies any alcohol use. Patient denies any fever, chest pain, shortness of breath, abdominal pain, or any other symptoms.  This is a newproblem. The current episode started more than 1 week ago. The problem occurs constantly. The problem has not changedsince onset.Pertinent negatives include no chest pain, no headachesand no shortness of breath. Nothingaggravates the symptoms. Nothingrelieves the symptoms. She has tried nothingfor the symptoms. The treatment provided norelief.  Action/Plan: Date:  Nov 12, 2016 Chart reviewed for concurrent status and case management needs. Will continue to follow patient progress. Discharge Planning: following for needs Expected discharge date: [id number] Marcelle SmilingRhonda Adib [place], BSN, [place], [place]   [phone]  Expected Discharge Date:                  Expected Discharge Plan:  Home/Self Care  In-House Referral:     Discharge planning Services  CM Consult  Post Acute Care Choice:    Choice offered to:     DME Arranged:    DME Agency:     HH Arranged:    HH Agency:     Status of Service:  In process, will continue to follow  If discussed at Long Length of Stay Meetings, dates discussed:    Additional Comments:  Savannah Palmer, Savannah Kowalewski Lynn, RN 11/12/2016, 8:44 AM

## 2016-11-12 NOTE — Progress Notes (Signed)
eLink Physician-Brief Progress Note Patient Name: Savannah Palmer DOB: [birth year] MRN: [id number]   Date of Service  11/12/2016  HPI/Events of Note  Pain and anxiety  eICU Interventions  Prn fentanyl Prn ativan     Intervention Category Intermediate Interventions: Pain - evaluation and management  Max FickleDouglas McQuaid 11/12/2016, 1:57 AM

## 2016-11-12 NOTE — Progress Notes (Addendum)
[facility] PROGRESS NOTE    Progress Note  [name]  WUJ:[id number] DOB: 1991/02/20 DOA: 11/10/2016 PCP: Patient, No Pcp Per     Brief Narrative:   Savannah Palmer is an 26 y.o. female past medical history of depression and anxiety IVDU who presents after shooting heroine and crack on the day of admission, she did started to become hypotensive.  Assessment/Plan:   Septic shock (HCC) due to  MRSA bacteremia/endocarditis/septic emboli: Blood cultures were done on 11/11/2016 that were positive for MRSA. She was started empirically on IV vancomycin, ID was consulted She did require pressors on admission. Blood pressure has been stable she's been mildly tachycardic. 2-D echo was done that showed right-sided vegetation. Check HIV.  Hypokalemia/hypomagnesemia/hypophosphatemia: Repeat orally  Thrombocytopenia: Acute infectious etiology still greater than 100 continue to monitor.   Sinus tachycardia: Likely due to infectious etiology.  Polysubstance abuse: Continue Suboxone.  DVT prophylaxis: lovenox Family Communication:father Disposition Plan/Barrier to D/C: none Code Status:     Code Status Orders        Start     Ordered   11/10/16 1952  Full code  Continuous     11/10/16 1952    Code Status History    Date Active Date Inactive Code Status Order ID Comments User Context   03/12/2016 10:00 PM 03/16/2016  6:15 PM Full Code [id number]  Eduard Clos, MD Inpatient   01/19/2016  5:02 PM 01/23/2016  5:36 PM Full Code [id number]  Sanjuana Kava, NP Inpatient   01/13/2016 12:43 AM 01/19/2016  4:04 PM Full Code [id number]  Hillary Bow, DO ED        IV Access:    Peripheral IV   Procedures and diagnostic studies:   Dg Chest 2 View  Result Date: 11/10/2016 CLINICAL DATA:  Chest pain, shortness of breath for 1 week EXAM: CHEST  2 VIEW COMPARISON:  CT chest 03/12/2016, chest x-ray 03/12/2016 FINDINGS: There are bilateral nodular airspace opacities  throughout the upper and lower lobes bilaterally. There is no pleural effusion or pneumothorax. The heart and mediastinal contours are unremarkable. The osseous structures are unremarkable. IMPRESSION: New bilateral nodular airspace opacities throughout the upper and lower lobes bilaterally. Findings are concerning for multifocal infection such as septic emboli. Recommend further evaluation with a CT of the chest with intravenous contrast. Electronically Signed   By: Elige Ko   On: 11/10/2016 13:23   Ct Chest Wo Contrast  Result Date: 11/10/2016 CLINICAL DATA:  She c/o some chest discomfrot and shortness of breath x 1 week. She states these are similar s/s she had a year ago, at which time she was dx with "pulmonary abscess". She is in no distress. IV drug user EXAM: CT CHEST WITHOUT CONTRAST TECHNIQUE: Multidetector CT imaging of the chest was performed following the standard protocol without IV contrast. COMPARISON:  Chest CT angiogram dated 03/12/2016. FINDINGS: Cardiovascular: Probable small pericardial effusion and/or pericardial thickening, difficult to characterize without IV contrast. Heart size is within normal limits. Thoracic aorta appears to be normal in caliber. Mediastinum/Nodes: Soft tissue density material within the right upper peritracheal space, also difficult to characterize without IV contrast, presumed lymphadenopathy. Additional mild lymphadenopathy within the anterior mediastinum and aortopulmonary window region. Lungs/Pleura: Numerous cavitary consolidations throughout both lungs, peripherally distributed suggesting septic emboli. More confluent consolidations at each lung base, more likely atelectasis than pneumonia. Small right pleural effusion. Upper Abdomen: Suspected splenomegaly, incompletely imaged. Musculoskeletal: No acute or suspicious osseous finding. IMPRESSION: 1. Numerous cavitary  consolidations throughout both lungs, with a peripheral distribution suggesting septic  emboli. Largest cavitary consolidation is within the left lower lobe measuring approximately 4 cm greatest dimension. 2. Probable small pericardial effusion and/or pericardial wall thickening, difficult to delineate without IV contrast. 3. Probable mediastinal lymphadenopathy, again difficult to definitively characterize without IV contrast, alternatively confluent fluid or edema within the mediastinum. 4. Small right pleural effusion. 5. Probable splenomegaly, incompletely imaged at the lower aspects of this chest CT. 6. Right IJ line in place with tip passing through the right atrium and into the right ventricle. Recommend retracting to the cavoatrial junction for optimal radiographic positioning. These results and recommendations were called by telephone at the time of interpretation on 11/10/2016 at 7:35 pm to Dr. Katrinka Blazing , who verbally acknowledged these results. Electronically Signed   By: Bary Richard M.D.   On: 11/10/2016 19:36   Dg Chest Port 1 View  Result Date: 11/11/2016 CLINICAL DATA:  Check right IJ line placement.  Initial encounter. EXAM: PORTABLE CHEST 1 VIEW COMPARISON:  Chest radiograph performed 11/10/2016 FINDINGS: The right IJ line is noted ending at the right atrium. This could be retracted 2-3 cm. Mild vascular congestion is noted. Mild left-sided opacities may reflect atelectasis or mild pneumonia. No pleural effusion or pneumothorax is seen. The cardiomediastinal silhouette is borderline normal in size. No acute osseous abnormalities are identified. IMPRESSION: 1. Right IJ line noted ending at the right atrium. This could be retracted 2-3 cm, as deemed clinically appropriate. 2. Mild vascular congestion noted. 3. Mild left-sided airspace opacities may reflect atelectasis or mild pneumonia. Electronically Signed   By: Roanna Raider M.D.   On: 11/11/2016 00:37   Dg Chest Port 1 View  Result Date: 11/10/2016 CLINICAL DATA:  Retraction of the IJ line about 10 cm EXAM: PORTABLE CHEST 1  VIEW COMPARISON:  CT chest 11/10/2016.  Chest 11/10/2016. FINDINGS: The left central venous catheter tip localizes to the mid/distal right atrial region. If placement at or above the cavoatrial junction is desired, retraction of about 3-4 cm is recommended. No pneumothorax. Shallow inspiration with atelectasis in the lung bases. Borderline heart size. Patchy airspace infiltrates in both lungs could indicate edema, aspiration, or pneumonia. No blunting of costophrenic angles. IMPRESSION: Central venous catheter tip is in the mid/ low right atrial region. If placement at or above the cavoatrial junction is desired, retraction of about 3-4 cm is recommended. Electronically Signed   By: Burman Nieves M.D.   On: 11/10/2016 21:35   Dg Chest Portable 1 View  Result Date: 11/10/2016 CLINICAL DATA:  Status post central line placement. EXAM: PORTABLE CHEST 1 VIEW COMPARISON:  Chest x-ray from earlier same day. FINDINGS: Interval placement of a right IJ central line with tip passing to the left of midline and likely in the right ventricle or main pulmonary artery. The bilateral small nodular airspace opacities throughout both lungs are unchanged in the short-term interval. Suspect small bilateral pleural effusions. No pneumothorax seen. IMPRESSION: 1. Right IJ central line placement with tip positioned to the left of midline either within the right ventricle or main pulmonary artery. Recommend retracting at least 10 cm. 2. Bilateral small nodular airspace opacities throughout both lungs are unchanged in the short-term interval. As recommended on earlier chest x-ray report, would consider chest CT for further characterization. 3. Suspect small bilateral pleural effusions. Electronically Signed   By: Bary Richard M.D.   On: 11/10/2016 18:37     Medical Consultants:    None.  Anti-Infectives:  Vancomycin  Subjective:    Arville Lime she seems to be open tissues just tends, she relates she feels  tired.  Objective:    Vitals:   11/12/16 0400 11/12/16 0410 11/12/16 0500 11/12/16 0600  BP: 115/77  117/71 115/73  Pulse: (!) 106  92 94  Resp: (!) 33  (!) 31 (!) 27  Temp:      TempSrc:      SpO2: 95%  96% 95%  Weight:  60 kg (132 lb 4.4 oz)    Height:        Intake/Output Summary (Last 24 hours) at 11/12/16 0708 Last data filed at 11/12/16 0600  Gross per 24 hour  Intake             3360 ml  Output             1450 ml  Net             1910 ml   Filed Weights   11/10/16 1944 11/11/16 0439 11/12/16 0410  Weight: 59.5 kg (131 lb 2.8 oz) 57.5 kg (126 lb 12.2 oz) 60 kg (132 lb 4.4 oz)    Exam: General exam:In no acute distress Respiratory system: Good air movement and clear to auscultation Cardiovascular system: Regular rate and rhythm with positive S1 and S2 with a diastolic murmur on the sternum Gastrointestinal system: Abdomen is soft nontender nondistended Central nervous system: Awake alert and oriented 3 nonfocal Extremities: No pedal edema Skin: No rashes Psychiatry: Judgment and insight appear normal.   Data Reviewed:    Labs: Basic Metabolic Panel:  Recent Labs Lab 11/10/16 1342 11/10/16 1350 11/10/16 2119 11/11/16 0306 11/12/16 0352  NA 129*  --  137 138 142  K 3.2*  --  3.1* 3.3* 3.3*  CL 91*  --  107 108 113*  CO2 24  --  23 23 24   GLUCOSE 127*  --  128* 175* 147*  BUN 34*  --  29* 24* 12  CREATININE 1.36*  --  1.04* 0.68 0.59  CALCIUM 8.5*  --  7.4* 7.6* 8.2*  MG  --  1.5*  --  2.6* 1.7  PHOS  --   --   --  1.9* 1.5*   GFR Estimated Creatinine Clearance: 88.2 mL/min (by C-G formula based on SCr of 0.59 mg/dL). Liver Function Tests:  Recent Labs Lab 11/10/16 1342 11/10/16 2119  AST 38 25  ALT 20 16  ALKPHOS 249* 164*  BILITOT 1.0 0.9  PROT 6.6 5.0*  ALBUMIN 2.8* 2.1*   No results for input(s): LIPASE, AMYLASE in the last 168 hours. No results for input(s): AMMONIA in the last 168 hours. Coagulation profile  Recent Labs Lab  11/10/16 2119  INR 1.16    CBC:  Recent Labs Lab 11/10/16 1342 11/10/16 2119 11/11/16 0306 11/12/16 0352  WBC 14.5* 12.8* 10.5 11.9*  NEUTROABS 12.3*  --   --   --   HGB 11.2* 9.6* 9.0* 8.4*  HCT 32.5* 28.2* 26.4* 24.2*  MCV 82.7 83.2 82.5 82.6  PLT 119* 104* 91* 108*   Cardiac Enzymes:  Recent Labs Lab 11/10/16 2119  TROPONINI 0.05*   BNP (last 3 results) No results for input(s): PROBNP in the last 8760 hours. CBG:  Recent Labs Lab 11/11/16 1156 11/11/16 1629 11/11/16 2012 11/11/16 2358 11/12/16 0338  GLUCAP 148* 184* 115* 125* 117*   D-Dimer: No results for input(s): DDIMER in the last 72 hours. Hgb A1c: No results for input(s): HGBA1C  in the last 72 hours. Lipid Profile: No results for input(s): CHOL, HDL, LDLCALC, TRIG, CHOLHDL, LDLDIRECT in the last 72 hours. Thyroid function studies: No results for input(s): TSH, T4TOTAL, T3FREE, THYROIDAB in the last 72 hours.  Invalid input(s): FREET3 Anemia work up: No results for input(s): VITAMINB12, FOLATE, FERRITIN, TIBC, IRON, RETICCTPCT in the last 72 hours. Sepsis Labs:  Recent Labs Lab 11/10/16 1342 11/10/16 1457 11/10/16 1733 11/10/16 2119 11/11/16 0306 11/12/16 0352  PROCALCITON  --   --   --  3.16  --   --   WBC 14.5*  --   --  12.8* 10.5 11.9*  LATICACIDVEN  --  3.26* 2.11* 1.9 1.6  --    Microbiology Recent Results (from the past 240 hour(s))  Blood Culture (routine x 2)     Status: None (Preliminary result)   Collection Time: 11/10/16  2:45 PM  Result Value Ref Range Status   Specimen Description BLOOD LEFT HAND  Final   Special Requests IN PEDIATRIC BOTTLE Blood Culture adequate volume  Final   Culture  Setup Time PED GRAM POSITIVE COCCI IN CLUSTERS  Final   Culture   Final    NO GROWTH < 24 HOURS Performed at [facility], [address] [place], [place] [address]    Report Status PENDING  Incomplete  Blood Culture (routine x 2)     Status: None (Preliminary result)    Collection Time: 11/10/16  2:55 PM  Result Value Ref Range Status   Specimen Description BLOOD LEFT ARM  Final   Special Requests   Final    BOTTLES DRAWN AEROBIC AND ANAEROBIC Blood Culture adequate volume   Culture  Setup Time   Final    IN BOTH AEROBIC AND ANAEROBIC BOTTLES GRAM POSITIVE COCCI IN CLUSTERS CRITICAL RESULT CALLED TO, READ BACK BY AND VERIFIED WITH: TO JGRIMSLEY(PHARMd) BY TCLEVELAND 11/11/2016 AT 6:15AM Performed at [facility], [address] [place], [place] [address]    Culture GRAM POSITIVE COCCI  Final   Report Status PENDING  Incomplete  Blood Culture ID Panel (Reflexed)     Status: Abnormal   Collection Time: 11/10/16  2:55 PM  Result Value Ref Range Status   Enterococcus species NOT DETECTED NOT DETECTED Final   Listeria monocytogenes NOT DETECTED NOT DETECTED Final   Staphylococcus species DETECTED (A) NOT DETECTED Final    Comment: CRITICAL RESULT CALLED TO, READ BACK BY AND VERIFIED WITH: TO JGRIMSLEY(PHARMd) BY TCLEVELAND 11/11/2016 AT 6:15AM    Staphylococcus aureus DETECTED (A) NOT DETECTED Final    Comment: Methicillin (oxacillin)-resistant Staphylococcus aureus (MRSA). MRSA is predictably resistant to beta-lactam antibiotics (except ceftaroline). Preferred therapy is vancomycin unless clinically contraindicated. Patient requires contact precautions if  hospitalized. CRITICAL RESULT CALLED TO, READ BACK BY AND VERIFIED WITH: TO JGRIMSLEY(PHARMd) BY TCLEVELAND 11/11/2016 AT 6:15AM    Methicillin resistance DETECTED (A) NOT DETECTED Final    Comment: CRITICAL RESULT CALLED TO, READ BACK BY AND VERIFIED WITH: TO JGRIMSLEY(PHARMd) BY TCLEVELAND 11/11/2016 AT 6:15AM    Streptococcus species NOT DETECTED NOT DETECTED Final   Streptococcus agalactiae NOT DETECTED NOT DETECTED Final   Streptococcus pneumoniae NOT DETECTED NOT DETECTED Final   Streptococcus pyogenes NOT DETECTED NOT DETECTED Final   Acinetobacter baumannii NOT DETECTED NOT DETECTED Final    Enterobacteriaceae species NOT DETECTED NOT DETECTED Final   Enterobacter cloacae complex NOT DETECTED NOT DETECTED Final   Escherichia coli NOT DETECTED NOT DETECTED Final   Klebsiella oxytoca NOT DETECTED  NOT DETECTED Final   Klebsiella pneumoniae NOT DETECTED NOT DETECTED Final   Proteus species NOT DETECTED NOT DETECTED Final   Serratia marcescens NOT DETECTED NOT DETECTED Final   Haemophilus influenzae NOT DETECTED NOT DETECTED Final   Neisseria meningitidis NOT DETECTED NOT DETECTED Final   Pseudomonas aeruginosa NOT DETECTED NOT DETECTED Final   Candida albicans NOT DETECTED NOT DETECTED Final   Candida glabrata NOT DETECTED NOT DETECTED Final   Candida krusei NOT DETECTED NOT DETECTED Final   Candida parapsilosis NOT DETECTED NOT DETECTED Final   Candida tropicalis NOT DETECTED NOT DETECTED Final    Comment: Performed at [facility], [address] [place], [place] [address]  Culture, blood (x 2)     Status: None (Preliminary result)   Collection Time: 11/10/16  9:46 PM  Result Value Ref Range Status   Specimen Description BLOOD LEFT ANTECUBITAL  Final   Special Requests IN PEDIATRIC BOTTLE Blood Culture adequate volume  Final   Culture  Setup Time   Final    GRAM POSITIVE COCCI IN CLUSTERS IN PEDIATRIC BOTTLE CRITICAL VALUE NOTED.  VALUE IS CONSISTENT WITH PREVIOUSLY REPORTED AND CALLED VALUE. Performed at [facility], [address], [place], [place] [address]    Culture GRAM POSITIVE COCCI  Final   Report Status PENDING  Incomplete  Culture, blood (x 2)     Status: None (Preliminary result)   Collection Time: 11/10/16  9:46 PM  Result Value Ref Range Status   Specimen Description BLOOD LEFT HAND  Final   Special Requests IN PEDIATRIC BOTTLE Blood Culture adequate volume  Final   Culture  Setup Time   Final    GRAM POSITIVE COCCI IN CLUSTERS IN PEDIATRIC BOTTLE CRITICAL RESULT CALLED TO, READ BACK BY AND VERIFIED WITH: D WOFFORD,PHARMD AT 1353 11/11/16  BY L BENFIELD Performed at [facility], [address], [place], [place] [address]    Culture GRAM POSITIVE COCCI  Final   Report Status PENDING  Incomplete  MRSA PCR Screening     Status: Abnormal   Collection Time: 11/10/16  9:58 PM  Result Value Ref Range Status   MRSA by PCR POSITIVE (A) NEGATIVE Final    Comment:        The GeneXpert MRSA Assay (FDA approved for NASAL specimens only), is one component of a comprehensive MRSA colonization surveillance program. It is not intended to diagnose MRSA infection nor to guide or monitor treatment for MRSA infections. RESULT CALLED TO, READ BACK BY AND VERIFIED WITH: ABBY CHAVEZ,RN [id number] @ 2357 BY J SCOTTON   Culture, blood (Routine X 2) w Reflex to ID Panel     Status: None (Preliminary result)   Collection Time: 11/11/16 10:34 AM  Result Value Ref Range Status   Specimen Description BLOOD RIGHT HAND  Final   Special Requests   Final    BOTTLES DRAWN AEROBIC AND ANAEROBIC Blood Culture adequate volume IMMUNOCOMPROMISED   Culture  Setup Time   Final    GRAM POSITIVE COCCI IN CLUSTERS ANAEROBIC BOTTLE ONLY CRITICAL RESULT CALLED TO, READ BACK BY AND VERIFIED WITH: T. PICKERING PHARMD, AT 9562 11/12/16 BY Renato Shin Performed at [facility], [address], [place], [place] [address]    Culture GRAM POSITIVE COCCI  Final   Report Status PENDING  Incomplete     Medications:   . buprenorphine-naloxone  2 tablet Sublingual Daily  . busPIRone  10 mg Oral BID  . chlorhexidine  15 mL Mouth Rinse BID  . Chlorhexidine Gluconate Cloth  6 each Topical Q0600  . citalopram  10 mg Oral Daily  . gabapentin  300 mg Oral TID  . heparin  5,000 Units Subcutaneous Q8H  . hydrocortisone sod succinate (SOLU-CORTEF) inj  50 mg Intravenous Q6H  . insulin aspart  2-6 Units Subcutaneous Q4H  . mouth rinse  15 mL Mouth Rinse q12n4p  . mupirocin ointment  1 application Nasal BID   Continuous Infusions: . sodium chloride    . sodium  chloride    . 0.9 % NaCl with KCl 40 mEq / L 100 mL/hr (11/12/16 0556)  . famotidine (PEPCID) IV Stopped (11/11/16 2242)  . norepinephrine (LEVOPHED) Adult infusion Stopped (11/11/16 0303)  . phenylephrine (NEO-SYNEPHRINE) Adult infusion Stopped (11/10/16 2316)  . vancomycin Stopped (11/12/16 0519)  . vasopressin (PITRESSIN) infusion - *FOR SHOCK* Stopped (11/11/16 0303)    Time spent: 35 min   LOS: 2 days   Marinda ElkFELIZ ORTIZ, Blessing Zaucha  [facility] Pager [phone]  *Please refer to [email or web address], password [email or web address] to get updated schedule on who will round on this patient, as hospitalists switch teams weekly. If 7PM-7AM, please contact night-coverage at [email or web address], password [email or web address] for any overnight needs.  11/12/2016, 7:08 AM

## 2016-11-12 NOTE — Progress Notes (Signed)
[facility]OC[facility] FOR AM LAB REPLACEMENT ONLY  The patient does apply for [facility] Electrolyte [facility] Protocol based on the criteria listed below:   1. Is GFR >/= 40 ml/min? Yes.    Patient's GFR today is >60 2. Is urine output >/= 0.5 ml/kg/hr for the last 6 hours? Yes.   Patient's UOP is 1.3 ml/kg/hr 3. Is BUN < 60 mg/dL? Yes.    Patient's BUN today is 12 4. Abnormal electrolyte(s): K 3.3, mag 1.7 5. Ordered repletion with: protocol for mag, K is already in IVF 6. If a panic level lab has been reported, has the CCM MD in charge been notified? No..   Physician:    Markus DaftWHELAN, Faron Whitelock A 11/12/2016 5:35 AM

## 2016-11-12 NOTE — Progress Notes (Signed)
Subjective: No new complaints   Antibiotics:  Anti-infectives    Start     Dose/Rate Route Frequency Ordered Stop   11/11/16 1100  vancomycin (VANCOCIN) IVPB 750 mg/150 ml premix     750 mg 150 mL/hr over 60 Minutes Intravenous Every 8 hours 11/11/16 0956     11/11/16 0400  vancomycin (VANCOCIN) 500 mg in sodium chloride 0.9 % 100 mL IVPB  Status:  Discontinued     500 mg 100 mL/hr over 60 Minutes Intravenous Every 12 hours 11/10/16 1951 11/11/16 0956   11/10/16 2200  ceFEPIme (MAXIPIME) 1 g in dextrose 5 % 50 mL IVPB  Status:  Discontinued     1 g 100 mL/hr over 30 Minutes Intravenous Every 12 hours 11/10/16 1951 11/11/16 0926   11/10/16 2000  ceFEPIme (MAXIPIME) 2 g in dextrose 5 % 50 mL IVPB  Status:  Discontinued     2 g 100 mL/hr over 30 Minutes Intravenous  Once 11/10/16 1952 11/10/16 1954   11/10/16 2000  vancomycin (VANCOCIN) IVPB 1000 mg/200 mL premix  Status:  Discontinued     1,000 mg 200 mL/hr over 60 Minutes Intravenous  Once 11/10/16 1952 11/10/16 1954   11/10/16 1430  piperacillin-tazobactam (ZOSYN) IVPB 3.375 g     3.375 g 100 mL/hr over 30 Minutes Intravenous  Once 11/10/16 1426 11/10/16 1503   11/10/16 1430  vancomycin (VANCOCIN) IVPB 1000 mg/200 mL premix     1,000 mg 200 mL/hr over 60 Minutes Intravenous  Once 11/10/16 1426 11/10/16 1653      Medications: Scheduled Meds: . buprenorphine-naloxone  2 tablet Sublingual Daily  . busPIRone  10 mg Oral BID  . Chlorhexidine Gluconate Cloth  6 each Topical Q0600  . citalopram  10 mg Oral Daily  . gabapentin  300 mg Oral TID  . heparin  5,000 Units Subcutaneous Q8H  . hydrocortisone sod succinate (SOLU-CORTEF) inj  50 mg Intravenous Q6H  . insulin aspart  2-6 Units Subcutaneous Q4H  . mupirocin ointment  1 application Nasal BID  . nicotine  14 mg Transdermal Daily  . pantoprazole  40 mg Oral Daily  . phosphorus  500 mg Oral QID  . potassium chloride  40 mEq Oral BID   Continuous Infusions: .  sodium chloride    . sodium chloride    . 0.9 % NaCl with KCl 40 mEq / L 100 mL/hr (11/12/16 1704)  . famotidine (PEPCID) IV Stopped (11/12/16 1128)  . vancomycin Stopped (11/12/16 1248)  . vasopressin (PITRESSIN) infusion - *FOR SHOCK* Stopped (11/11/16 0303)   PRN Meds:.sodium chloride, sodium chloride, acetaminophen, albuterol, ALPRAZolam, dicyclomine, docusate, hydrOXYzine, loperamide, methocarbamol, naproxen, ondansetron (ZOFRAN) IV, ondansetron, sodium chloride flush    Objective: Weight change: 12 lb 4.4 oz (5.568 kg)  Intake/Output Summary (Last 24 hours) at 11/12/16 1735 Last data filed at 11/12/16 1700  Gross per 24 hour  Intake             3390 ml  Output             1450 ml  Net             1940 ml   Blood pressure 115/78, pulse 85, temperature 98.8 F (37.1 C), temperature source Oral, resp. rate (!) 30, height 5\' 3"  (1.6 m), weight 132 lb 4.4 oz (60 kg), last menstrual period 10/27/2016, SpO2 98 %. Temp:  [98 F (36.7 C)-98.8 F (37.1 C)] 98.8 F (37.1 C) (05/28 1600)  Pulse Rate:  [85-120] 85 (05/28 1700) Resp:  [23-39] 30 (05/28 1700) BP: (108-140)/(71-92) 115/78 (05/28 1700) SpO2:  [92 %-99 %] 98 % (05/28 1700) Weight:  [132 lb 4.4 oz (60 kg)] 132 lb 4.4 oz (60 kg) (05/28 0410)  Physical Exam: General: Alert and awake, oriented x3, not in any acute distress. HEENT: anicteric sclera, pupils reactive to light and accommodation, EOMI CVS regular rate, normal r,  no murmur rubs or gallops Chest: clear to auscultation bilaterally, no wheezing, rales or rhonchi Abdomen: soft nontender, nondistended, normal bowel sounds, Extremities: no  clubbing or edema noted bilaterally Skin: track marks on leg   Neuro: nonfocal  CBC: CBC Latest Ref Rng & Units 11/12/2016 11/11/2016 11/10/2016  WBC 4.0 - 10.5 K/uL 11.9(H) 10.5 12.8(H)  Hemoglobin 12.0 - 15.0 g/dL 4.0(J) 8.1(X) 9.1(Y)  Hematocrit 36.0 - 46.0 % 24.2(L) 26.4(L) 28.2(L)  Platelets 150 - 400 K/uL 108(L) 91(L)  104(L)    BMET  Recent Labs  11/11/16 0306 11/12/16 0352  NA 138 142  K 3.3* 3.3*  CL 108 113*  CO2 23 24  GLUCOSE 175* 147*  BUN 24* 12  CREATININE 0.68 0.59  CALCIUM 7.6* 8.2*     Liver Panel   Recent Labs  11/10/16 1342 11/10/16 2119  PROT 6.6 5.0*  ALBUMIN 2.8* 2.1*  AST 38 25  ALT 20 16  ALKPHOS 249* 164*  BILITOT 1.0 0.9  BILIDIR 0.5  --   IBILI 0.5  --        Sedimentation Rate No results for input(s): ESRSEDRATE in the last 72 hours. C-Reactive Protein No results for input(s): CRP in the last 72 hours.  Micro Results: Recent Results (from the past 720 hour(s))  Blood Culture (routine x 2)     Status: Abnormal (Preliminary result)   Collection Time: 11/10/16  2:45 PM  Result Value Ref Range Status   Specimen Description BLOOD LEFT HAND  Final   Special Requests IN PEDIATRIC BOTTLE Blood Culture adequate volume  Final   Culture  Setup Time   Final    GRAM POSITIVE COCCI IN CLUSTERS IN PEDIATRIC BOTTLE CRITICAL VALUE NOTED.  VALUE IS CONSISTENT WITH PREVIOUSLY REPORTED AND CALLED VALUE. Performed at [facility], [address], [place], [place] [address]    Culture STAPHYLOCOCCUS AUREUS (A)  Final   Report Status PENDING  Incomplete  Blood Culture (routine x 2)     Status: Abnormal (Preliminary result)   Collection Time: 11/10/16  2:55 PM  Result Value Ref Range Status   Specimen Description BLOOD LEFT ARM  Final   Special Requests   Final    BOTTLES DRAWN AEROBIC AND ANAEROBIC Blood Culture adequate volume   Culture  Setup Time   Final    IN BOTH AEROBIC AND ANAEROBIC BOTTLES GRAM POSITIVE COCCI IN CLUSTERS CRITICAL RESULT CALLED TO, READ BACK BY AND VERIFIED WITH: TO JGRIMSLEY(PHARMd) BY TCLEVELAND 11/11/2016 AT 6:15AM    Culture (A)  Final    STAPHYLOCOCCUS AUREUS SUSCEPTIBILITIES TO FOLLOW Performed at [facility], [address], [place], [place] [address]    Report Status PENDING  Incomplete  Blood Culture ID Panel  (Reflexed)     Status: Abnormal   Collection Time: 11/10/16  2:55 PM  Result Value Ref Range Status   Enterococcus species NOT DETECTED NOT DETECTED Final   Listeria monocytogenes NOT DETECTED NOT DETECTED Final   Staphylococcus species DETECTED (A) NOT DETECTED Final    Comment: CRITICAL RESULT CALLED  TO, READ BACK BY AND VERIFIED WITH: TO JGRIMSLEY(PHARMd) BY TCLEVELAND 11/11/2016 AT 6:15AM    Staphylococcus aureus DETECTED (A) NOT DETECTED Final    Comment: Methicillin (oxacillin)-resistant Staphylococcus aureus (MRSA). MRSA is predictably resistant to beta-lactam antibiotics (except ceftaroline). Preferred therapy is vancomycin unless clinically contraindicated. Patient requires contact precautions if  hospitalized. CRITICAL RESULT CALLED TO, READ BACK BY AND VERIFIED WITH: TO JGRIMSLEY(PHARMd) BY TCLEVELAND 11/11/2016 AT 6:15AM    Methicillin resistance DETECTED (A) NOT DETECTED Final    Comment: CRITICAL RESULT CALLED TO, READ BACK BY AND VERIFIED WITH: TO JGRIMSLEY(PHARMd) BY TCLEVELAND 11/11/2016 AT 6:15AM    Streptococcus species NOT DETECTED NOT DETECTED Final   Streptococcus agalactiae NOT DETECTED NOT DETECTED Final   Streptococcus pneumoniae NOT DETECTED NOT DETECTED Final   Streptococcus pyogenes NOT DETECTED NOT DETECTED Final   Acinetobacter baumannii NOT DETECTED NOT DETECTED Final   Enterobacteriaceae species NOT DETECTED NOT DETECTED Final   Enterobacter cloacae complex NOT DETECTED NOT DETECTED Final   Escherichia coli NOT DETECTED NOT DETECTED Final   Klebsiella oxytoca NOT DETECTED NOT DETECTED Final   Klebsiella pneumoniae NOT DETECTED NOT DETECTED Final   Proteus species NOT DETECTED NOT DETECTED Final   Serratia marcescens NOT DETECTED NOT DETECTED Final   Haemophilus influenzae NOT DETECTED NOT DETECTED Final   Neisseria meningitidis NOT DETECTED NOT DETECTED Final   Pseudomonas aeruginosa NOT DETECTED NOT DETECTED Final   Candida albicans NOT DETECTED NOT  DETECTED Final   Candida glabrata NOT DETECTED NOT DETECTED Final   Candida krusei NOT DETECTED NOT DETECTED Final   Candida parapsilosis NOT DETECTED NOT DETECTED Final   Candida tropicalis NOT DETECTED NOT DETECTED Final    Comment: Performed at [facility], [address], [place], [place] [address]  Urine culture     Status: None   Collection Time: 11/10/16  9:32 PM  Result Value Ref Range Status   Specimen Description URINE, CLEAN CATCH  Final   Special Requests NONE  Final   Culture   Final    NO GROWTH Performed at [facility], [address], [place], [place] [address]    Report Status 11/12/2016 FINAL  Final  Culture, blood (x 2)     Status: Abnormal (Preliminary result)   Collection Time: 11/10/16  9:46 PM  Result Value Ref Range Status   Specimen Description BLOOD LEFT ANTECUBITAL  Final   Special Requests IN PEDIATRIC BOTTLE Blood Culture adequate volume  Final   Culture  Setup Time   Final    GRAM POSITIVE COCCI IN CLUSTERS IN PEDIATRIC BOTTLE CRITICAL VALUE NOTED.  VALUE IS CONSISTENT WITH PREVIOUSLY REPORTED AND CALLED VALUE. Performed at [facility], [address] [place], [place] [address]    Culture STAPHYLOCOCCUS AUREUS (A)  Final   Report Status PENDING  Incomplete  Culture, blood (x 2)     Status: Abnormal (Preliminary result)   Collection Time: 11/10/16  9:46 PM  Result Value Ref Range Status   Specimen Description BLOOD LEFT HAND  Final   Special Requests IN PEDIATRIC BOTTLE Blood Culture adequate volume  Final   Culture  Setup Time   Final    GRAM POSITIVE COCCI IN CLUSTERS IN PEDIATRIC BOTTLE CRITICAL RESULT CALLED TO, READ BACK BY AND VERIFIED WITH: D WOFFORD,PHARMD AT 1353 11/11/16 BY L BENFIELD Performed at [facility], [address] [place], [place] [address]    Culture STAPHYLOCOCCUS AUREUS (A)  Final   Report Status  PENDING  Incomplete  MRSA PCR Screening     Status: Abnormal   Collection Time: 11/10/16  9:58 PM    Result Value Ref Range Status   MRSA by PCR POSITIVE (A) NEGATIVE Final    Comment:        The GeneXpert MRSA Assay (FDA approved for NASAL specimens only), is one component of a comprehensive MRSA colonization surveillance program. It is not intended to diagnose MRSA infection nor to guide or monitor treatment for MRSA infections. RESULT CALLED TO, READ BACK BY AND VERIFIED WITH: ABBY CHAVEZ,RN [id number] @ 2357 BY J SCOTTON   Culture, blood (Routine X 2) w Reflex to ID Panel     Status: None (Preliminary result)   Collection Time: 11/11/16 10:30 AM  Result Value Ref Range Status   Specimen Description BLOOD RIGHT HAND  Final   Special Requests   Final    BOTTLES DRAWN AEROBIC AND ANAEROBIC Blood Culture adequate volume IMMUNOCOMPROMISED   Culture   Final    NO GROWTH < 24 HOURS Performed at [facility], [address], [place], [place] [address]    Report Status PENDING  Incomplete  Culture, blood (Routine X 2) w Reflex to ID Panel     Status: None (Preliminary result)   Collection Time: 11/11/16 10:34 AM  Result Value Ref Range Status   Specimen Description BLOOD BLOOD RIGHT HAND  Final   Special Requests   Final    BOTTLES DRAWN AEROBIC AND ANAEROBIC Blood Culture adequate volume   Culture  Setup Time   Final    GRAM POSITIVE COCCI IN CLUSTERS IN BOTH AEROBIC AND ANAEROBIC BOTTLES CRITICAL RESULT CALLED TO, READ BACK BY AND VERIFIED WITH: T. PICKERING PHARMD, AT 2956 11/12/16 BY Renato Shin Performed at [facility], [address], [place], [place] [address]    Culture GRAM POSITIVE COCCI  Final   Report Status PENDING  Incomplete    Studies/Results: Ct Chest Wo Contrast  Result Date: 11/10/2016 CLINICAL DATA:  She c/o some chest discomfrot and shortness of breath x 1 week. She states these are similar s/s she had a year ago, at which time she was dx with "pulmonary abscess". She is in no distress. IV drug user EXAM: CT CHEST WITHOUT CONTRAST TECHNIQUE:  Multidetector CT imaging of the chest was performed following the standard protocol without IV contrast. COMPARISON:  Chest CT angiogram dated 03/12/2016. FINDINGS: Cardiovascular: Probable small pericardial effusion and/or pericardial thickening, difficult to characterize without IV contrast. Heart size is within normal limits. Thoracic aorta appears to be normal in caliber. Mediastinum/Nodes: Soft tissue density material within the right upper peritracheal space, also difficult to characterize without IV contrast, presumed lymphadenopathy. Additional mild lymphadenopathy within the anterior mediastinum and aortopulmonary window region. Lungs/Pleura: Numerous cavitary consolidations throughout both lungs, peripherally distributed suggesting septic emboli. More confluent consolidations at each lung base, more likely atelectasis than pneumonia. Small right pleural effusion. Upper Abdomen: Suspected splenomegaly, incompletely imaged. Musculoskeletal: No acute or suspicious osseous finding. IMPRESSION: 1. Numerous cavitary consolidations throughout both lungs, with a peripheral distribution suggesting septic emboli. Largest cavitary consolidation is within the left lower lobe measuring approximately 4 cm greatest dimension. 2. Probable small pericardial effusion and/or pericardial wall thickening, difficult to delineate without IV contrast. 3. Probable mediastinal lymphadenopathy, again difficult to definitively characterize without IV contrast, alternatively confluent fluid or edema within the mediastinum. 4. Small right pleural effusion. 5. Probable splenomegaly, incompletely imaged at the lower aspects of this chest CT.  6. Right IJ line in place with tip passing through the right atrium and into the right ventricle. Recommend retracting to the cavoatrial junction for optimal radiographic positioning. These results and recommendations were called by telephone at the time of interpretation on 11/10/2016 at 7:35 pm to  Dr. Katrinka Blazing , who verbally acknowledged these results. Electronically Signed   By: Bary Richard M.D.   On: 11/10/2016 19:36   Dg Chest Port 1 View  Result Date: 11/11/2016 CLINICAL DATA:  Check right IJ line placement.  Initial encounter. EXAM: PORTABLE CHEST 1 VIEW COMPARISON:  Chest radiograph performed 11/10/2016 FINDINGS: The right IJ line is noted ending at the right atrium. This could be retracted 2-3 cm. Mild vascular congestion is noted. Mild left-sided opacities may reflect atelectasis or mild pneumonia. No pleural effusion or pneumothorax is seen. The cardiomediastinal silhouette is borderline normal in size. No acute osseous abnormalities are identified. IMPRESSION: 1. Right IJ line noted ending at the right atrium. This could be retracted 2-3 cm, as deemed clinically appropriate. 2. Mild vascular congestion noted. 3. Mild left-sided airspace opacities may reflect atelectasis or mild pneumonia. Electronically Signed   By: Roanna Raider M.D.   On: 11/11/2016 00:37   Dg Chest Port 1 View  Result Date: 11/10/2016 CLINICAL DATA:  Retraction of the IJ line about 10 cm EXAM: PORTABLE CHEST 1 VIEW COMPARISON:  CT chest 11/10/2016.  Chest 11/10/2016. FINDINGS: The left central venous catheter tip localizes to the mid/distal right atrial region. If placement at or above the cavoatrial junction is desired, retraction of about 3-4 cm is recommended. No pneumothorax. Shallow inspiration with atelectasis in the lung bases. Borderline heart size. Patchy airspace infiltrates in both lungs could indicate edema, aspiration, or pneumonia. No blunting of costophrenic angles. IMPRESSION: Central venous catheter tip is in the mid/ low right atrial region. If placement at or above the cavoatrial junction is desired, retraction of about 3-4 cm is recommended. Electronically Signed   By: Burman Nieves M.D.   On: 11/10/2016 21:35   Dg Chest Portable 1 View  Result Date: 11/10/2016 CLINICAL DATA:  Status post  central line placement. EXAM: PORTABLE CHEST 1 VIEW COMPARISON:  Chest x-ray from earlier same day. FINDINGS: Interval placement of a right IJ central line with tip passing to the left of midline and likely in the right ventricle or main pulmonary artery. The bilateral small nodular airspace opacities throughout both lungs are unchanged in the short-term interval. Suspect small bilateral pleural effusions. No pneumothorax seen. IMPRESSION: 1. Right IJ central line placement with tip positioned to the left of midline either within the right ventricle or main pulmonary artery. Recommend retracting at least 10 cm. 2. Bilateral small nodular airspace opacities throughout both lungs are unchanged in the short-term interval. As recommended on earlier chest x-ray report, would consider chest CT for further characterization. 3. Suspect small bilateral pleural effusions. Electronically Signed   By: Bary Richard M.D.   On: 11/10/2016 18:37      Assessment/Plan:  INTERVAL HISTORY:   Central line removed this am   Active Problems:   Septic embolism (HCC)   Septic shock (HCC)   Encounter for central line placement   Respiratory failure (HCC)   MRSA bacteremia   Hypokalemia   Hypophosphatemia   Endocarditis   Thrombocytopenia (HCC)    Savannah Palmer is a 26 y.o. 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                                            [place] Antimicrobial Management Team Staphylococcus aureus bacteremia   Staphylococcus aureus bacteremia (SAB) is associated with a high rate of complications and mortality.  Specific aspects of clinical management are critical to optimizing the outcome of patients with SAB.  Therefore, the [facility] Management Team [facility]) has initiated an intervention aimed at improving the management of SAB at [facility].  To do so, Infectious  Diseases physicians are providing an evidence-based consult for the management of all patients with SAB.     Yes No Comments  Perform follow-up blood cultures (even if the patient is afebrile) to ensure clearance of bacteremia [x]  []  REPEATING BLOOD CULTURES TONIGHT AFTER CENTRAL LINE OUT  Remove vascular catheter and obtain follow-up blood cultures after the removal of the catheter [x]  []  CENTRAL LINE IS OUT  Perform echocardiography to evaluate for endocarditis (transthoracic ECHO is 40-50% sensitive, TEE is > 90% sensitive) [x]  []  Please keep in mind, that neither test can definitively EXCLUDE endocarditis, and that should clinical suspicion remain high for endocarditis the patient should then still be treated with an "endocarditis" duration of therapy = 6 weeks  TTE with vegetation on TV. Would consider TEE as well and  whether or not CVTS will need to be involved  Consult electrophysiologist to evaluate implanted cardiac device (pacemaker, ICD) []  []  NA  Ensure source control []  []  Have all abscesses been drained effectively? Have deep seeded infections (septic joints or osteomyelitis) had appropriate surgical debridement?  Source is IVDU  Investigate for "metastatic" sites of infection []  []  Does the patient have ANY symptom or physical exam finding that would suggest a deeper infection (back or neck pain that may be suggestive of vertebral osteomyelitis or epidural abscess, muscle pain that could be a symptom of pyomyositis)?  Keep in mind that for deep seeded infections MRI imaging with contrast is preferred rather than other often insensitive tests such as plain x-rays, especially early in a patient's presentation.  Lungs and heart seem to be main concern right now  Change antibiotic therapy to vancomycin [x]  []  Beta-lactam antibiotics are preferred for MSSA due to higher cure rates.   If on Vancomycin, goal trough should be 15 - 20 mcg/mL  Estimated duration of IV antibiotic  therapy:  6 weeks [x]  []  Consult case management for probably prolonged outpatient IV antibiotic therapy    #2 IVDU: she needs to re-enage in a treatment plan. Will also check for viral hepatitis, HIV      LOS: 2 days   Acey LavCornelius Van Dam 11/12/2016, 5:35 PM

## 2016-11-13 ENCOUNTER — Encounter (HOSPITAL_COMMUNITY): Payer: Self-pay

## 2016-11-13 LAB — GLUCOSE, CAPILLARY
GLUCOSE-CAPILLARY: 97 mg/dL (ref 65–99)
GLUCOSE-CAPILLARY: 110 mg/dL — AB (ref 65–99)
GLUCOSE-CAPILLARY: 96 mg/dL (ref 65–99)
Glucose-Capillary: 88 mg/dL (ref 65–99)
Glucose-Capillary: 93 mg/dL (ref 65–99)

## 2016-11-13 LAB — CULTURE, BLOOD (ROUTINE X 2)
SPECIAL REQUESTS: ADEQUATE
Special Requests: ADEQUATE
Special Requests: ADEQUATE
Special Requests: ADEQUATE

## 2016-11-13 LAB — HIV ANTIBODY (ROUTINE TESTING W REFLEX): HIV Screen 4th Generation wRfx: NONREACTIVE

## 2016-11-13 LAB — MAGNESIUM: Magnesium: 1.6 mg/dL — ABNORMAL LOW (ref 1.7–2.4)

## 2016-11-13 LAB — VANCOMYCIN, TROUGH: Vancomycin Tr: 13 ug/mL — ABNORMAL LOW (ref 15–20)

## 2016-11-13 LAB — HCV RNA QUANT RFLX ULTRA OR GENOTYP
HCV RNA Qnt(log copy/mL): UNDETERMINED log10 IU/mL
HepC Qn: NOT DETECTED IU/mL

## 2016-11-13 LAB — CREATININE, SERUM
Creatinine, Ser: 0.61 mg/dL (ref 0.44–1.00)
GFR calc Af Amer: >60 mL/min (ref >60)

## 2016-11-13 MED ORDER — NICOTINE POLACRILEX 2 MG MT GUM
2.0000 mg | CHEWING_GUM | OROMUCOSAL | Status: DC | PRN
  Administered 2016-11-13: 2 mg via ORAL
  Filled 2016-11-13: qty 1

## 2016-11-13 MED ORDER — VANCOMYCIN HCL IN DEXTROSE 1-5 GM/200ML-% IV SOLN
1000.0000 mg | Freq: Three times a day (TID) | INTRAVENOUS | Status: DC
  Administered 2016-11-13 – 2016-11-16 (×9): 1000 mg via INTRAVENOUS
  Filled 2016-11-13 (×8): qty 200

## 2016-11-13 MED ORDER — MAGNESIUM SULFATE 2 GM/50ML IV SOLN
2.0000 g | Freq: Once | INTRAVENOUS | Status: AC
  Administered 2016-11-13: 2 g via INTRAVENOUS
  Filled 2016-11-13: qty 50

## 2016-11-13 MED ORDER — POTASSIUM CHLORIDE CRYS ER 20 MEQ PO TBCR
40.0000 meq | EXTENDED_RELEASE_TABLET | Freq: Two times a day (BID) | ORAL | Status: AC
  Administered 2016-11-13 (×2): 40 meq via ORAL
  Filled 2016-11-13 (×2): qty 2

## 2016-11-13 NOTE — Progress Notes (Signed)
[facility] PROGRESS NOTE    Progress Note  Savannah Palmer  ZOX:[id number] DOB: Nov 22, 1990 DOA: 11/10/2016 PCP: Patient, No Pcp Per     Brief Narrative:   Savannah Palmer is an 26 y.o. female past medical history of depression and anxiety IVDU who presents after shooting heroine and crack on the day of admission, she did started to become hypotensive.  Assessment/Plan:   Septic shock (HCC) due to  MRSA bacteremia/endocarditis/septic emboli: Blood cultures were done on 11/11/2016 that were positive for MRSA. She was started empirically on IV vancomycin, ID was consulted She did require pressors on admission. Repeated BC on 5.28.2018 cont to be positive, will repeat on 5.30.2018. 2-D echo was done that showed right-sided vegetation. HIV negative, hepatitis panel pending.   Hypokalemia/hypomagnesemia/hypophosphatemia: Low cont to replete. Check a b-me t in am  Thrombocytopenia: Acute infectious etiology still greater than 100 continue to monitor.   Sinus tachycardia: Likely due to infectious etiology. Now resolved.  Polysubstance abuse: Continue Suboxone.  DVT prophylaxis: lovenox Family Communication:father Disposition Plan/Barrier to D/C: transfer to med surg Code Status:     Code Status Orders        Start     Ordered   11/10/16 1952  Full code  Continuous     11/10/16 1952    Code Status History    Date Active Date Inactive Code Status Order ID Comments User Context   03/12/2016 10:00 PM 03/16/2016  6:15 PM Full Code [id number]  Eduard Clos, MD Inpatient   01/19/2016  5:02 PM 01/23/2016  5:36 PM Full Code [id number]  Sanjuana Kava, NP Inpatient   01/13/2016 12:43 AM 01/19/2016  4:04 PM Full Code [id number]  Hillary Bow, DO ED        IV Access:    Peripheral IV   Procedures and diagnostic studies:   No results found.   Medical Consultants:    None.  Anti-Infectives:   Vancomycin  Subjective:    Arville Lime she  related pleuritic cp on the right side.  Objective:    Vitals:   11/13/16 0100 11/13/16 0200 11/13/16 0300 11/13/16 0400  BP: (!) 90/57 98/77  99/69  Pulse: (!) 56 67  65  Resp: (!) 22 20  (!) 36  Temp:   98.4 F (36.9 C)   TempSrc:   Oral   SpO2: 97% 95%  98%  Weight:      Height:        Intake/Output Summary (Last 24 hours) at 11/13/16 0709 Last data filed at 11/13/16 0500  Gross per 24 hour  Intake             3450 ml  Output              600 ml  Net             2850 ml   Filed Weights   11/10/16 1944 11/11/16 0439 11/12/16 0410  Weight: 59.5 kg (131 lb 2.8 oz) 57.5 kg (126 lb 12.2 oz) 60 kg (132 lb 4.4 oz)    Exam: General exam:In no acute distress Respiratory system: Good air movement and clear to auscultation Cardiovascular system: Denies any rhythm with positive S1 and S2 and a diastolic murmur on the sternum Gastrointestinal system: Positive bowel sounds soft nontender nondistended Central nervous system: He is awake alert and oriented 3 and again noted Extremities: No pedal edema in the lower extremities Skin: No rashes Psychiatry: Judgment and insight appear normal.  Data Reviewed:    Labs: Basic Metabolic Panel:  Recent Labs Lab 11/10/16 1342 11/10/16 1350 11/10/16 2119 11/11/16 0306 11/12/16 0352 11/13/16 0335  NA 129*  --  137 138 142  --   K 3.2*  --  3.1* 3.3* 3.3*  --   CL 91*  --  107 108 113*  --   CO2 24  --  23 23 24   --   GLUCOSE 127*  --  128* 175* 147*  --   BUN 34*  --  29* 24* 12  --   CREATININE 1.36*  --  1.04* 0.68 0.59 0.61  CALCIUM 8.5*  --  7.4* 7.6* 8.2*  --   MG  --  1.5*  --  2.6* 1.7 1.6*  PHOS  --   --   --  1.9* 1.5*  --    GFR Estimated Creatinine Clearance: 88.2 mL/min (by C-G formula based on SCr of 0.61 mg/dL). Liver Function Tests:  Recent Labs Lab 11/10/16 1342 11/10/16 2119  AST 38 25  ALT 20 16  ALKPHOS 249* 164*  BILITOT 1.0 0.9  PROT 6.6 5.0*  ALBUMIN 2.8* 2.1*   No results for input(s):  LIPASE, AMYLASE in the last 168 hours. No results for input(s): AMMONIA in the last 168 hours. Coagulation profile  Recent Labs Lab 11/10/16 2119  INR 1.16    CBC:  Recent Labs Lab 11/10/16 1342 11/10/16 2119 11/11/16 0306 11/12/16 0352  WBC 14.5* 12.8* 10.5 11.9*  NEUTROABS 12.3*  --   --   --   HGB 11.2* 9.6* 9.0* 8.4*  HCT 32.5* 28.2* 26.4* 24.2*  MCV 82.7 83.2 82.5 82.6  PLT 119* 104* 91* 108*   Cardiac Enzymes:  Recent Labs Lab 11/10/16 2119  TROPONINI 0.05*   BNP (last 3 results) No results for input(s): PROBNP in the last 8760 hours. CBG:  Recent Labs Lab 11/12/16 1544 11/12/16 1659 11/12/16 1935 11/12/16 2342 11/13/16 0339  GLUCAP 122* 104* 132* 113* 88   D-Dimer: No results for input(s): DDIMER in the last 72 hours. Hgb A1c: No results for input(s): HGBA1C in the last 72 hours. Lipid Profile: No results for input(s): CHOL, HDL, LDLCALC, TRIG, CHOLHDL, LDLDIRECT in the last 72 hours. Thyroid function studies: No results for input(s): TSH, T4TOTAL, T3FREE, THYROIDAB in the last 72 hours.  Invalid input(s): FREET3 Anemia work up: No results for input(s): VITAMINB12, FOLATE, FERRITIN, TIBC, IRON, RETICCTPCT in the last 72 hours. Sepsis Labs:  Recent Labs Lab 11/10/16 1342 11/10/16 1457 11/10/16 1733 11/10/16 2119 11/11/16 0306 11/12/16 0352  PROCALCITON  --   --   --  3.16  --   --   WBC 14.5*  --   --  12.8* 10.5 11.9*  LATICACIDVEN  --  3.26* 2.11* 1.9 1.6  --    Microbiology Recent Results (from the past 240 hour(s))  Blood Culture (routine x 2)     Status: Abnormal (Preliminary result)   Collection Time: 11/10/16  2:45 PM  Result Value Ref Range Status   Specimen Description BLOOD LEFT HAND  Final   Special Requests IN PEDIATRIC BOTTLE Blood Culture adequate volume  Final   Culture  Setup Time   Final    GRAM POSITIVE COCCI IN CLUSTERS IN PEDIATRIC BOTTLE CRITICAL VALUE NOTED.  VALUE IS CONSISTENT WITH PREVIOUSLY REPORTED AND  CALLED VALUE. Performed at [facility], [address] [place], [place] [address]    Culture STAPHYLOCOCCUS AUREUS (A)  Final  Report Status PENDING  Incomplete  Blood Culture (routine x 2)     Status: Abnormal (Preliminary result)   Collection Time: 11/10/16  2:55 PM  Result Value Ref Range Status   Specimen Description BLOOD LEFT ARM  Final   Special Requests   Final    BOTTLES DRAWN AEROBIC AND ANAEROBIC Blood Culture adequate volume   Culture  Setup Time   Final    IN BOTH AEROBIC AND ANAEROBIC BOTTLES GRAM POSITIVE COCCI IN CLUSTERS CRITICAL RESULT CALLED TO, READ BACK BY AND VERIFIED WITH: TO JGRIMSLEY(PHARMd) BY TCLEVELAND 11/11/2016 AT 6:15AM    Culture (A)  Final    STAPHYLOCOCCUS AUREUS SUSCEPTIBILITIES TO FOLLOW Performed at [facility], [address] [place], [place] [address]    Report Status PENDING  Incomplete  Blood Culture ID Panel (Reflexed)     Status: Abnormal   Collection Time: 11/10/16  2:55 PM  Result Value Ref Range Status   Enterococcus species NOT DETECTED NOT DETECTED Final   Listeria monocytogenes NOT DETECTED NOT DETECTED Final   Staphylococcus species DETECTED (A) NOT DETECTED Final    Comment: CRITICAL RESULT CALLED TO, READ BACK BY AND VERIFIED WITH: TO JGRIMSLEY(PHARMd) BY TCLEVELAND 11/11/2016 AT 6:15AM    Staphylococcus aureus DETECTED (A) NOT DETECTED Final    Comment: Methicillin (oxacillin)-resistant Staphylococcus aureus (MRSA). MRSA is predictably resistant to beta-lactam antibiotics (except ceftaroline). Preferred therapy is vancomycin unless clinically contraindicated. Patient requires contact precautions if  hospitalized. CRITICAL RESULT CALLED TO, READ BACK BY AND VERIFIED WITH: TO JGRIMSLEY(PHARMd) BY TCLEVELAND 11/11/2016 AT 6:15AM    Methicillin resistance DETECTED (A) NOT DETECTED Final    Comment: CRITICAL RESULT CALLED TO, READ BACK BY AND VERIFIED WITH: TO JGRIMSLEY(PHARMd) BY TCLEVELAND 11/11/2016 AT 6:15AM     Streptococcus species NOT DETECTED NOT DETECTED Final   Streptococcus agalactiae NOT DETECTED NOT DETECTED Final   Streptococcus pneumoniae NOT DETECTED NOT DETECTED Final   Streptococcus pyogenes NOT DETECTED NOT DETECTED Final   Acinetobacter baumannii NOT DETECTED NOT DETECTED Final   Enterobacteriaceae species NOT DETECTED NOT DETECTED Final   Enterobacter cloacae complex NOT DETECTED NOT DETECTED Final   Escherichia coli NOT DETECTED NOT DETECTED Final   Klebsiella oxytoca NOT DETECTED NOT DETECTED Final   Klebsiella pneumoniae NOT DETECTED NOT DETECTED Final   Proteus species NOT DETECTED NOT DETECTED Final   Serratia marcescens NOT DETECTED NOT DETECTED Final   Haemophilus influenzae NOT DETECTED NOT DETECTED Final   Neisseria meningitidis NOT DETECTED NOT DETECTED Final   Pseudomonas aeruginosa NOT DETECTED NOT DETECTED Final   Candida albicans NOT DETECTED NOT DETECTED Final   Candida glabrata NOT DETECTED NOT DETECTED Final   Candida krusei NOT DETECTED NOT DETECTED Final   Candida parapsilosis NOT DETECTED NOT DETECTED Final   Candida tropicalis NOT DETECTED NOT DETECTED Final    Comment: Performed at [facility], [address], [place], [place] [address]  Urine culture     Status: None   Collection Time: 11/10/16  9:32 PM  Result Value Ref Range Status   Specimen Description URINE, CLEAN CATCH  Final   Special Requests NONE  Final   Culture   Final    NO GROWTH Performed at [facility], [address], [place], [place] [address]    Report Status 11/12/2016 FINAL  Final  Culture, blood (x 2)     Status: Abnormal (Preliminary result)   Collection Time: 11/10/16  9:46 PM  Result Value Ref Range Status  Specimen Description BLOOD LEFT ANTECUBITAL  Final   Special Requests IN PEDIATRIC BOTTLE Blood Culture adequate volume  Final   Culture  Setup Time   Final    GRAM POSITIVE COCCI IN CLUSTERS IN PEDIATRIC BOTTLE CRITICAL VALUE NOTED.  VALUE IS CONSISTENT  WITH PREVIOUSLY REPORTED AND CALLED VALUE. Performed at [facility], [address], [place], [place] [address]    Culture STAPHYLOCOCCUS AUREUS (A)  Final   Report Status PENDING  Incomplete  Culture, blood (x 2)     Status: Abnormal (Preliminary result)   Collection Time: 11/10/16  9:46 PM  Result Value Ref Range Status   Specimen Description BLOOD LEFT HAND  Final   Special Requests IN PEDIATRIC BOTTLE Blood Culture adequate volume  Final   Culture  Setup Time   Final    GRAM POSITIVE COCCI IN CLUSTERS IN PEDIATRIC BOTTLE CRITICAL RESULT CALLED TO, READ BACK BY AND VERIFIED WITH: D WOFFORD,PHARMD AT 1353 11/11/16 BY L BENFIELD Performed at [facility], [address] [place], [place] [address]    Culture STAPHYLOCOCCUS AUREUS (A)  Final   Report Status PENDING  Incomplete  MRSA PCR Screening     Status: Abnormal   Collection Time: 11/10/16  9:58 PM  Result Value Ref Range Status   MRSA by PCR POSITIVE (A) NEGATIVE Final    Comment:        The GeneXpert MRSA Assay (FDA approved for NASAL specimens only), is one component of a comprehensive MRSA colonization surveillance program. It is not intended to diagnose MRSA infection nor to guide or monitor treatment for MRSA infections. RESULT CALLED TO, READ BACK BY AND VERIFIED WITH: ABBY CHAVEZ,RN [id number] @ 2357 BY J SCOTTON   Culture, blood (Routine X 2) w Reflex to ID Panel     Status: None (Preliminary result)   Collection Time: 11/11/16 10:30 AM  Result Value Ref Range Status   Specimen Description BLOOD RIGHT HAND  Final   Special Requests   Final    BOTTLES DRAWN AEROBIC AND ANAEROBIC Blood Culture adequate volume IMMUNOCOMPROMISED   Culture   Final    NO GROWTH < 24 HOURS Performed at [facility], [address] [place], [place] [address]    Report Status PENDING  Incomplete  Culture, blood (Routine X 2) w Reflex to ID Panel     Status: None (Preliminary result)   Collection Time: 11/11/16 10:34  AM  Result Value Ref Range Status   Specimen Description BLOOD BLOOD RIGHT HAND  Final   Special Requests   Final    BOTTLES DRAWN AEROBIC AND ANAEROBIC Blood Culture adequate volume   Culture  Setup Time   Final    GRAM POSITIVE COCCI IN CLUSTERS IN BOTH AEROBIC AND ANAEROBIC BOTTLES CRITICAL RESULT CALLED TO, READ BACK BY AND VERIFIED WITH: T. PICKERING PHARMD, AT 9562 11/12/16 BY Renato Shin Performed at [facility], [address], [place], [place] [address]    Culture GRAM POSITIVE COCCI  Final   Report Status PENDING  Incomplete     Medications:   . buprenorphine-naloxone  2 tablet Sublingual Daily  . busPIRone  10 mg Oral BID  . Chlorhexidine Gluconate Cloth  6 each Topical Q0600  . citalopram  10 mg Oral Daily  . gabapentin  300 mg Oral TID  . heparin  5,000 Units Subcutaneous Q8H  . hydrocortisone sod succinate (SOLU-CORTEF) inj  50 mg Intravenous Q6H  . insulin aspart  2-6 Units Subcutaneous Q4H  . mupirocin ointment  1 application Nasal BID  . nicotine  14 mg Transdermal Daily  . pantoprazole  40 mg Oral Daily  . potassium chloride  40 mEq Oral BID   Continuous Infusions: . sodium chloride    . 0.9 % NaCl with KCl 40 mEq / L 100 mL/hr at 11/13/16 0500  . famotidine (PEPCID) IV Stopped (11/12/16 2216)  . magnesium sulfate 1 - 4 g bolus IVPB    . vancomycin Stopped (11/13/16 0407)  . vasopressin (PITRESSIN) infusion - *FOR SHOCK* Stopped (11/11/16 0303)    Time spent: 35 min   LOS: 3 days   Marinda Elk  [facility] Pager [phone]  *Please refer to [email or web address], password [email or web address] to get updated schedule on who will round on this patient, as hospitalists switch teams weekly. If 7PM-7AM, please contact night-coverage at [email or web address], password [email or web address] for any overnight needs.  11/13/2016, 7:09 AM

## 2016-11-13 NOTE — Progress Notes (Addendum)
Pharmacy Antibiotic Note  Savannah Palmer is a [age] y.o. female admitted on 11/10/2016 after injecting herself with heroin and crack cocaine. Patient being followed by ID and currently on Vancomycin for septic shock due to MRSA bacteremia, TV IE, and septic emboli to the lungs.    Plan:  Increase Vancomycin to 1g IV q8h due to subtherapeutic trough level (goal vancomycin trough level 15-20 mcg/mL).   Daily SCr  Monitor cultures, clinical course.    Height: 5\' 3"  (160 cm) Weight: 144 lb 6.4 oz (65.5 kg) IBW/kg (Calculated) : 52.4  Temp (24hrs), Avg:98.2 F (36.8 C), Min:97.6 F (36.4 C), Max:98.8 F (37.1 C)   Recent Labs Lab 11/10/16 1342 11/10/16 1457 11/10/16 1733 11/10/16 2119 11/11/16 0306 11/12/16 0352 11/13/16 0335 11/13/16 1049  WBC 14.5*  --   --  12.8* 10.5 11.9*  --   --   CREATININE 1.36*  --   --  1.04* 0.68 0.59 0.61  --   LATICACIDVEN  --  3.26* 2.11* 1.9 1.6  --   --   --   VANCOTROUGH  --   --   --   --   --   --   --  13*    Estimated Creatinine Clearance: 96.9 mL/min (by C-G formula based on SCr of 0.61 mg/dL).    No Known Allergies  Antimicrobials this admission: 5/26 Vancomycin >> 5/26 Zosyn x 1 5/26 Cefepime >> 5/27  Dose adjustments this admission: 5/27: Vanc increased to 750mg  IV q8h with improved renal function 5/29 1049 VT = 13 mcg/mL on Vanc 750mg  IV q8h, dose increased to 1g IV q8h  Microbiology results: 5/26 UCx: NGF 5/26 BCx: MRSA 5/26 MRSA PCR: positive  5/27 BCx: 1/2 MRSA  5/28 BCx: 1/2 GPC in clusters 5/29 BCx: sent  Thank you for allowing pharmacy to be a part of this patient's care.    Greer PickerelJigna Johanna Stafford, PharmD, BCPS Pager: [phone] 11/13/2016 12:04 PM

## 2016-11-13 NOTE — Progress Notes (Signed)
Subjective: No new complaints   Antibiotics:  Anti-infectives    Start     Dose/Rate Route Frequency Ordered Stop   11/13/16 1215  vancomycin (VANCOCIN) IVPB 1000 mg/200 mL premix     1,000 mg 200 mL/hr over 60 Minutes Intravenous Every 8 hours 11/13/16 1203     11/11/16 1100  vancomycin (VANCOCIN) IVPB 750 mg/150 ml premix  Status:  Discontinued     750 mg 150 mL/hr over 60 Minutes Intravenous Every 8 hours 11/11/16 0956 11/13/16 1203   11/11/16 0400  vancomycin (VANCOCIN) 500 mg in sodium chloride 0.9 % 100 mL IVPB  Status:  Discontinued     500 mg 100 mL/hr over 60 Minutes Intravenous Every 12 hours 11/10/16 1951 11/11/16 0956   11/10/16 2200  ceFEPIme (MAXIPIME) 1 g in dextrose 5 % 50 mL IVPB  Status:  Discontinued     1 g 100 mL/hr over 30 Minutes Intravenous Every 12 hours 11/10/16 1951 11/11/16 0926   11/10/16 2000  ceFEPIme (MAXIPIME) 2 g in dextrose 5 % 50 mL IVPB  Status:  Discontinued     2 g 100 mL/hr over 30 Minutes Intravenous  Once 11/10/16 1952 11/10/16 1954   11/10/16 2000  vancomycin (VANCOCIN) IVPB 1000 mg/200 mL premix  Status:  Discontinued     1,000 mg 200 mL/hr over 60 Minutes Intravenous  Once 11/10/16 1952 11/10/16 1954   11/10/16 1430  piperacillin-tazobactam (ZOSYN) IVPB 3.375 g     3.375 g 100 mL/hr over 30 Minutes Intravenous  Once 11/10/16 1426 11/10/16 1503   11/10/16 1430  vancomycin (VANCOCIN) IVPB 1000 mg/200 mL premix     1,000 mg 200 mL/hr over 60 Minutes Intravenous  Once 11/10/16 1426 11/10/16 1653      Medications: Scheduled Meds: . buprenorphine-naloxone  2 tablet Sublingual Daily  . busPIRone  10 mg Oral BID  . Chlorhexidine Gluconate Cloth  6 each Topical Q0600  . citalopram  10 mg Oral Daily  . gabapentin  300 mg Oral TID  . heparin  5,000 Units Subcutaneous Q8H  . insulin aspart  2-6 Units Subcutaneous Q4H  . mupirocin ointment  1 application Nasal BID  . nicotine  14 mg Transdermal Daily  . potassium chloride  40  mEq Oral BID   Continuous Infusions: . sodium chloride    . 0.9 % NaCl with KCl 40 mEq / L 100 mL/hr at 11/13/16 0500  . vancomycin 1,000 mg (11/13/16 1340)   PRN Meds:.sodium chloride, acetaminophen, albuterol, ALPRAZolam, dicyclomine, docusate, hydrOXYzine, loperamide, methocarbamol, ondansetron (ZOFRAN) IV, ondansetron, sodium chloride flush    Objective: Weight change:   Intake/Output Summary (Last 24 hours) at 11/13/16 1413 Last data filed at 11/13/16 1300  Gross per 24 hour  Intake             2490 ml  Output              600 ml  Net             1890 ml   Blood pressure 119/79, pulse 74, temperature 97 F (36.1 C), temperature source Oral, resp. rate 16, height 5\' 3"  (1.6 m), weight 144 lb 6.4 oz (65.5 kg), last menstrual period 10/27/2016, SpO2 100 %. Temp:  [97 F (36.1 C)-98.8 F (37.1 C)] 97 F (36.1 C) (05/29 1356) Pulse Rate:  [56-101] 74 (05/29 1356) Resp:  [16-36] 16 (05/29 1356) BP: (90-125)/(57-102) 119/79 (05/29 1356) SpO2:  [94 %-100 %] 100 % (  05/29 1356) Weight:  [144 lb 6.4 oz (65.5 kg)] 144 lb 6.4 oz (65.5 kg) (05/29 0950)  Physical Exam: General: Alert and awake, oriented x3, not in any acute distress. HEENT: anicteric sclera, pupils reactive to light and accommodation, EOMI CVS regular rate, normal r,  no murmur rubs or gallops Chest: clear to auscultation bilaterally, no wheezing, rales or rhonchi Abdomen: soft nontender, nondistended, normal bowel sounds, Extremities: no  clubbing or edema noted bilaterally Skin: track marks on leg   Neuro: nonfocal  CBC: CBC Latest Ref Rng & Units 11/12/2016 11/11/2016 11/10/2016  WBC 4.0 - 10.5 K/uL 11.9(H) 10.5 12.8(H)  Hemoglobin 12.0 - 15.0 g/dL 1.6(X) 0.9(U) 0.4(V)  Hematocrit 36.0 - 46.0 % 24.2(L) 26.4(L) 28.2(L)  Platelets 150 - 400 K/uL 108(L) 91(L) 104(L)    BMET  Recent Labs  11/11/16 0306 11/12/16 0352 11/13/16 0335  NA 138 142  --   K 3.3* 3.3*  --   CL 108 113*  --   CO2 23 24  --     GLUCOSE 175* 147*  --   BUN 24* 12  --   CREATININE 0.68 0.59 0.61  CALCIUM 7.6* 8.2*  --      Liver Panel   Recent Labs  11/10/16 2119  PROT 5.0*  ALBUMIN 2.1*  AST 25  ALT 16  ALKPHOS 164*  BILITOT 0.9       Sedimentation Rate No results for input(s): ESRSEDRATE in the last 72 hours. C-Reactive Protein No results for input(s): CRP in the last 72 hours.  Micro Results: Recent Results (from the past 720 hour(s))  Blood Culture (routine x 2)     Status: Abnormal   Collection Time: 11/10/16  2:45 PM  Result Value Ref Range Status   Specimen Description BLOOD LEFT HAND  Final   Special Requests IN PEDIATRIC BOTTLE Blood Culture adequate volume  Final   Culture  Setup Time   Final    GRAM POSITIVE COCCI IN CLUSTERS IN PEDIATRIC BOTTLE CRITICAL VALUE NOTED.  VALUE IS CONSISTENT WITH PREVIOUSLY REPORTED AND CALLED VALUE.    Culture (A)  Final    STAPHYLOCOCCUS AUREUS SUSCEPTIBILITIES PERFORMED ON PREVIOUS CULTURE WITHIN THE LAST 5 DAYS. Performed at [facility], [address], [place], [place] [address]    Report Status 11/13/2016 FINAL  Final  Blood Culture (routine x 2)     Status: Abnormal   Collection Time: 11/10/16  2:55 PM  Result Value Ref Range Status   Specimen Description BLOOD LEFT ARM  Final   Special Requests   Final    BOTTLES DRAWN AEROBIC AND ANAEROBIC Blood Culture adequate volume   Culture  Setup Time   Final    IN BOTH AEROBIC AND ANAEROBIC BOTTLES GRAM POSITIVE COCCI IN CLUSTERS CRITICAL RESULT CALLED TO, READ BACK BY AND VERIFIED WITH: TO JGRIMSLEY(PHARMd) BY TCLEVELAND 11/11/2016 AT 6:15AM Performed at [facility], [address], [place], [place] [address]    Culture METHICILLIN RESISTANT STAPHYLOCOCCUS AUREUS (A)  Final   Report Status 11/13/2016 FINAL  Final   Organism ID, Bacteria METHICILLIN RESISTANT STAPHYLOCOCCUS AUREUS  Final      Susceptibility   Methicillin resistant staphylococcus aureus - MIC*    CIPROFLOXACIN  >=8 RESISTANT Resistant     ERYTHROMYCIN >=8 RESISTANT Resistant     GENTAMICIN <=0.5 SENSITIVE Sensitive     OXACILLIN >=4 RESISTANT Resistant     TETRACYCLINE <=1 SENSITIVE Sensitive     VANCOMYCIN 1 SENSITIVE Sensitive  TRIMETH/SULFA <=10 SENSITIVE Sensitive     CLINDAMYCIN <=0.25 SENSITIVE Sensitive     RIFAMPIN <=0.5 SENSITIVE Sensitive     Inducible Clindamycin NEGATIVE Sensitive     * METHICILLIN RESISTANT STAPHYLOCOCCUS AUREUS  Blood Culture ID Panel (Reflexed)     Status: Abnormal   Collection Time: 11/10/16  2:55 PM  Result Value Ref Range Status   Enterococcus species NOT DETECTED NOT DETECTED Final   Listeria monocytogenes NOT DETECTED NOT DETECTED Final   Staphylococcus species DETECTED (A) NOT DETECTED Final    Comment: CRITICAL RESULT CALLED TO, READ BACK BY AND VERIFIED WITH: TO JGRIMSLEY(PHARMd) BY TCLEVELAND 11/11/2016 AT 6:15AM    Staphylococcus aureus DETECTED (A) NOT DETECTED Final    Comment: Methicillin (oxacillin)-resistant Staphylococcus aureus (MRSA). MRSA is predictably resistant to beta-lactam antibiotics (except ceftaroline). Preferred therapy is vancomycin unless clinically contraindicated. Patient requires contact precautions if  hospitalized. CRITICAL RESULT CALLED TO, READ BACK BY AND VERIFIED WITH: TO JGRIMSLEY(PHARMd) BY TCLEVELAND 11/11/2016 AT 6:15AM    Methicillin resistance DETECTED (A) NOT DETECTED Final    Comment: CRITICAL RESULT CALLED TO, READ BACK BY AND VERIFIED WITH: TO JGRIMSLEY(PHARMd) BY TCLEVELAND 11/11/2016 AT 6:15AM    Streptococcus species NOT DETECTED NOT DETECTED Final   Streptococcus agalactiae NOT DETECTED NOT DETECTED Final   Streptococcus pneumoniae NOT DETECTED NOT DETECTED Final   Streptococcus pyogenes NOT DETECTED NOT DETECTED Final   Acinetobacter baumannii NOT DETECTED NOT DETECTED Final   Enterobacteriaceae species NOT DETECTED NOT DETECTED Final   Enterobacter cloacae complex NOT DETECTED NOT DETECTED Final    Escherichia coli NOT DETECTED NOT DETECTED Final   Klebsiella oxytoca NOT DETECTED NOT DETECTED Final   Klebsiella pneumoniae NOT DETECTED NOT DETECTED Final   Proteus species NOT DETECTED NOT DETECTED Final   Serratia marcescens NOT DETECTED NOT DETECTED Final   Haemophilus influenzae NOT DETECTED NOT DETECTED Final   Neisseria meningitidis NOT DETECTED NOT DETECTED Final   Pseudomonas aeruginosa NOT DETECTED NOT DETECTED Final   Candida albicans NOT DETECTED NOT DETECTED Final   Candida glabrata NOT DETECTED NOT DETECTED Final   Candida krusei NOT DETECTED NOT DETECTED Final   Candida parapsilosis NOT DETECTED NOT DETECTED Final   Candida tropicalis NOT DETECTED NOT DETECTED Final    Comment: Performed at [facility], [address], [place], [place] [address]  Urine culture     Status: None   Collection Time: 11/10/16  9:32 PM  Result Value Ref Range Status   Specimen Description URINE, CLEAN CATCH  Final   Special Requests NONE  Final   Culture   Final    NO GROWTH Performed at [facility], [address], [place], [place] [address]    Report Status 11/12/2016 FINAL  Final  Culture, blood (x 2)     Status: Abnormal   Collection Time: 11/10/16  9:46 PM  Result Value Ref Range Status   Specimen Description BLOOD LEFT ANTECUBITAL  Final   Special Requests IN PEDIATRIC BOTTLE Blood Culture adequate volume  Final   Culture  Setup Time   Final    GRAM POSITIVE COCCI IN CLUSTERS IN PEDIATRIC BOTTLE CRITICAL VALUE NOTED.  VALUE IS CONSISTENT WITH PREVIOUSLY REPORTED AND CALLED VALUE.    Culture (A)  Final    STAPHYLOCOCCUS AUREUS SUSCEPTIBILITIES PERFORMED ON PREVIOUS CULTURE WITHIN THE LAST 5 DAYS. Performed at [facility], [address], [place], [place] [address]    Report Status 11/13/2016 FINAL  Final  Culture, blood (  x 2)     Status: Abnormal   Collection Time: 11/10/16  9:46 PM  Result Value Ref Range Status   Specimen Description BLOOD LEFT HAND   Final   Special Requests IN PEDIATRIC BOTTLE Blood Culture adequate volume  Final   Culture  Setup Time   Final    GRAM POSITIVE COCCI IN CLUSTERS IN PEDIATRIC BOTTLE CRITICAL RESULT CALLED TO, READ BACK BY AND VERIFIED WITH: D WOFFORD,PHARMD AT 1353 11/11/16 BY L BENFIELD    Culture (A)  Final    STAPHYLOCOCCUS AUREUS SUSCEPTIBILITIES PERFORMED ON PREVIOUS CULTURE WITHIN THE LAST 5 DAYS. Performed at [facility], [address] [place], [place] [address]    Report Status 11/13/2016 FINAL  Final  MRSA PCR Screening     Status: Abnormal   Collection Time: 11/10/16  9:58 PM  Result Value Ref Range Status   MRSA by PCR POSITIVE (A) NEGATIVE Final    Comment:        The GeneXpert MRSA Assay (FDA approved for NASAL specimens only), is one component of a comprehensive MRSA colonization surveillance program. It is not intended to diagnose MRSA infection nor to guide or monitor treatment for MRSA infections. RESULT CALLED TO, READ BACK BY AND VERIFIED WITH: ABBY CHAVEZ,RN [id number] @ 2357 BY J SCOTTON   Culture, blood (Routine X 2) w Reflex to ID Panel     Status: None (Preliminary result)   Collection Time: 11/11/16 10:30 AM  Result Value Ref Range Status   Specimen Description BLOOD RIGHT HAND  Final   Special Requests   Final    BOTTLES DRAWN AEROBIC AND ANAEROBIC Blood Culture adequate volume IMMUNOCOMPROMISED   Culture   Final    NO GROWTH < 24 HOURS Performed at [facility], [address], [place], [place] [address]    Report Status PENDING  Incomplete  Culture, blood (Routine X 2) w Reflex to ID Panel     Status: Abnormal (Preliminary result)   Collection Time: 11/11/16 10:34 AM  Result Value Ref Range Status   Specimen Description BLOOD BLOOD RIGHT HAND  Final   Special Requests   Final    BOTTLES DRAWN AEROBIC AND ANAEROBIC Blood Culture adequate volume   Culture  Setup Time   Final    GRAM POSITIVE COCCI IN CLUSTERS IN BOTH AEROBIC AND ANAEROBIC  BOTTLES CRITICAL RESULT CALLED TO, READ BACK BY AND VERIFIED WITH: T. PICKERING PHARMD, AT 2130 11/12/16 BY Renato Shin Performed at [facility], [address] [place], [place] [address]    Culture STAPHYLOCOCCUS AUREUS (A)  Final   Report Status PENDING  Incomplete  Culture, blood (Routine X 2) w Reflex to ID Panel     Status: None (Preliminary result)   Collection Time: 11/12/16  5:42 PM  Result Value Ref Range Status   Specimen Description BLOOD RIGHT ARM  Final   Special Requests IN PEDIATRIC BOTTLE Blood Culture adequate volume  Final   Culture  Setup Time   Final    GRAM POSITIVE COCCI IN CLUSTERS IN PEDIATRIC BOTTLE CRITICAL RESULT CALLED TO, READ BACK BY AND VERIFIED WITHHaze Boyden PHARMD, AT 4696 11/13/16 BY Renato Shin Performed at [facility], [address] [place], [place] [address]    Culture [facility] POSITIVE COCCI  Final   Report Status PENDING  Incomplete    Studies/Results: No results found.    Assessment/Plan:  INTERVAL HISTORY:   Central line removed this am  Active Problems:   Septic embolism (HCC)   Septic shock (HCC)   Encounter for central line placement   Respiratory failure (HCC)   MRSA bacteremia   Hypokalemia   Hypophosphatemia   Endocarditis   Thrombocytopenia (HCC)    Savannah Palmer is a 26 y.o. 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                                            [place] [facility] Staphylococcus aureus bacteremia   Staphylococcus aureus bacteremia (SAB) is associated with a high rate of complications and mortality.  Specific aspects of clinical management are critical to optimizing the outcome of patients with SAB.  Therefore, the [facility]) has initiated an intervention aimed at improving the management of SAB at [facility].  To do so,  Infectious Diseases physicians are providing an evidence-based consult for the management of all patients with SAB.     Yes No Comments  Perform follow-up blood cultures (even if the patient is afebrile) to ensure clearance of bacteremia [x]  []  BLOOD CULTURES STILL POSITIVE POST REMOVAL OF LINE, REPEATING AGAIN TODAY  Remove vascular catheter and obtain follow-up blood cultures after the removal of the catheter [x]  []  CENTRAL LINE IS OUT NO CENTRAL LINES X NEXT 5 DAYS MINIMUM  Perform echocardiography to evaluate for endocarditis (transthoracic ECHO is 40-50% sensitive, TEE is > 90% sensitive) [x]  []  Please keep in mind, that neither test can definitively EXCLUDE endocarditis, and that should clinical suspicion remain high for endocarditis the patient should then still be treated with an "endocarditis" duration of therapy = 6 weeks  TTE with vegetation on TV. Would consider TEE as well and  whether or not CVTS will need to be involved  Consult electrophysiologist to evaluate implanted cardiac device (pacemaker, ICD) []  []  NA  Ensure source control []  []  Have all abscesses been drained effectively? Have deep seeded infections (septic joints or osteomyelitis) had appropriate surgical debridement?  Source is IVDU  Investigate for "metastatic" sites of infection []  []  Does the patient have ANY symptom or physical exam finding that would suggest a deeper infection (back or neck pain that may be suggestive of vertebral osteomyelitis or epidural abscess, muscle pain that could be a symptom of pyomyositis)?  Keep in mind that for deep seeded infections MRI imaging with contrast is preferred rather than other often insensitive tests such as plain x-rays, especially early in a patient's presentation.  Lungs and heart seem to be main concern right now  Change antibiotic therapy to vancomycin [x]  []  Beta-lactam antibiotics are preferred for MSSA due to higher cure rates.   If on Vancomycin, goal trough  should be 15 - 20 mcg/mL  Estimated duration of IV antibiotic therapy:  6 weeks [x]  []  Consult case management for probably prolonged outpatient IV antibiotic therapy    #2 IVDU: she needs to re-enage in a treatment plan. HIV negative, HCV RNA ND, so she has cleared this from prior infection     LOS: 3 days   Savannah Palmer 11/13/2016, 2:13 PM

## 2016-11-14 ENCOUNTER — Encounter (HOSPITAL_COMMUNITY): Payer: Self-pay

## 2016-11-14 ENCOUNTER — Inpatient Hospital Stay (HOSPITAL_COMMUNITY): Payer: BLUE CROSS/BLUE SHIELD

## 2016-11-14 DIAGNOSIS — R109 Unspecified abdominal pain: Secondary | ICD-10-CM

## 2016-11-14 DIAGNOSIS — R1011 Right upper quadrant pain: Secondary | ICD-10-CM

## 2016-11-14 DIAGNOSIS — M25551 Pain in right hip: Secondary | ICD-10-CM

## 2016-11-14 LAB — BASIC METABOLIC PANEL
Anion gap: 6 (ref 5–15)
BUN: 13 mg/dL (ref 6–20)
CO2: 19 mmol/L — ABNORMAL LOW (ref 22–32)
Calcium: 7.7 mg/dL — ABNORMAL LOW (ref 8.9–10.3)
Chloride: 114 mmol/L — ABNORMAL HIGH (ref 101–111)
Creatinine, Ser: 0.74 mg/dL (ref 0.44–1.00)
Glucose, Bld: 106 mg/dL — ABNORMAL HIGH (ref 65–99)
POTASSIUM: 4.4 mmol/L (ref 3.5–5.1)
SODIUM: 139 mmol/L (ref 135–145)

## 2016-11-14 LAB — BLOOD CULTURE ID PANEL (REFLEXED)
Acinetobacter baumannii: NOT DETECTED
CANDIDA TROPICALIS: NOT DETECTED
Candida albicans: NOT DETECTED
Candida glabrata: NOT DETECTED
Candida krusei: NOT DETECTED
Candida parapsilosis: NOT DETECTED
ENTEROCOCCUS SPECIES: NOT DETECTED
Enterobacter cloacae complex: NOT DETECTED
Enterobacteriaceae species: NOT DETECTED
Escherichia coli: NOT DETECTED
Haemophilus influenzae: NOT DETECTED
KLEBSIELLA PNEUMONIAE: NOT DETECTED
Klebsiella oxytoca: NOT DETECTED
LISTERIA MONOCYTOGENES: NOT DETECTED
METHICILLIN RESISTANCE: DETECTED — AB
NEISSERIA MENINGITIDIS: NOT DETECTED
PROTEUS SPECIES: NOT DETECTED
Pseudomonas aeruginosa: NOT DETECTED
STAPHYLOCOCCUS AUREUS BCID: DETECTED — AB
STAPHYLOCOCCUS SPECIES: DETECTED — AB
STREPTOCOCCUS AGALACTIAE: NOT DETECTED
STREPTOCOCCUS SPECIES: NOT DETECTED
Serratia marcescens: NOT DETECTED
Streptococcus pneumoniae: NOT DETECTED
Streptococcus pyogenes: NOT DETECTED

## 2016-11-14 LAB — GLUCOSE, CAPILLARY
GLUCOSE-CAPILLARY: 116 mg/dL — AB (ref 65–99)
GLUCOSE-CAPILLARY: 120 mg/dL — AB (ref 65–99)
GLUCOSE-CAPILLARY: 84 mg/dL (ref 65–99)
Glucose-Capillary: 109 mg/dL — ABNORMAL HIGH (ref 65–99)
Glucose-Capillary: 72 mg/dL (ref 65–99)
Glucose-Capillary: 80 mg/dL (ref 65–99)
Glucose-Capillary: 92 mg/dL (ref 65–99)

## 2016-11-14 LAB — CULTURE, BLOOD (ROUTINE X 2): SPECIAL REQUESTS: ADEQUATE

## 2016-11-14 LAB — CBC
HCT: 26.3 % — ABNORMAL LOW (ref 36.0–46.0)
Hemoglobin: 8.8 g/dL — ABNORMAL LOW (ref 12.0–15.0)
MCH: 28.6 pg (ref 26.0–34.0)
MCHC: 33.5 g/dL (ref 30.0–36.0)
MCV: 85.4 fL (ref 78.0–100.0)
PLATELETS: 123 10*3/uL — AB (ref 150–400)
RBC: 3.08 MIL/uL — ABNORMAL LOW (ref 3.87–5.11)
RDW: 15.4 % (ref 11.5–15.5)
WBC: 12.7 10*3/uL — ABNORMAL HIGH (ref 4.0–10.5)

## 2016-11-14 MED ORDER — IOPAMIDOL (ISOVUE-300) INJECTION 61%
100.0000 mL | Freq: Once | INTRAVENOUS | Status: AC | PRN
  Administered 2016-11-14: 80 mL via INTRAVENOUS

## 2016-11-14 MED ORDER — IOPAMIDOL (ISOVUE-300) INJECTION 61%
15.0000 mL | Freq: Two times a day (BID) | INTRAVENOUS | Status: AC | PRN
  Administered 2016-11-14 (×2): 15 mL via ORAL
  Filled 2016-11-14 (×2): qty 30

## 2016-11-14 MED ORDER — IOPAMIDOL (ISOVUE-300) INJECTION 61%
INTRAVENOUS | Status: AC
  Filled 2016-11-14: qty 30

## 2016-11-14 MED ORDER — IOPAMIDOL (ISOVUE-300) INJECTION 61%
INTRAVENOUS | Status: AC
  Filled 2016-11-14: qty 75

## 2016-11-14 NOTE — Progress Notes (Signed)
PROGRESS NOTE    Savannah Palmer   ZOX:[id number]  DOB: May 08, 1991  DOA: 11/10/2016 PCP: Patient, No Pcp Per   Brief Narrative:  26 y/o female with anxiety, depression and heroin abuse who presents with weakness, cough and chest pain. Found to be in septic shock and required pressors. Cultures have grown MRSA. She continues to be febrile with positive blood cultures and central line has been removed. Cultures are being repeated.   Subjective: Pain in right upper abdomen today. No other complaints.   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 - HIV negative, hepatitis panel negative - ID assisting with management - no central line for 5 days  RUQ pain - obtain CT scan    Hypokalemia   Hypophosphatemia   Hypomagnesemia - replete as needed     Thrombocytopenia   - mild - cont to follow  Heroin abuse - cont Suboxone  DVT prophylaxis: Heparin Code Status: Full code Family Communication: mother Disposition Plan: follow in hospital  Consultants:   ID Procedures:     Antimicrobials:  Anti-infectives    Start     Dose/Rate Route Frequency Ordered Stop   11/13/16 1215  vancomycin (VANCOCIN) IVPB 1000 mg/200 mL premix     1,000 mg 200 mL/hr over 60 Minutes Intravenous Every 8 hours 11/13/16 1203     11/11/16 1100  vancomycin (VANCOCIN) IVPB 750 mg/150 ml premix  Status:  Discontinued     750 mg 150 mL/hr over 60 Minutes Intravenous Every 8 hours 11/11/16 0956 11/13/16 1203   11/11/16 0400  vancomycin (VANCOCIN) 500 mg in sodium chloride 0.9 % 100 mL IVPB  Status:  Discontinued     500 mg 100 mL/hr over 60 Minutes Intravenous Every 12 hours 11/10/16 1951 11/11/16 0956   11/10/16 2200   ceFEPIme (MAXIPIME) 1 g in dextrose 5 % 50 mL IVPB  Status:  Discontinued     1 g 100 mL/hr over 30 Minutes Intravenous Every 12 hours 11/10/16 1951 11/11/16 0926   11/10/16 2000  ceFEPIme (MAXIPIME) 2 g in dextrose 5 % 50 mL IVPB  Status:  Discontinued     2 g 100 mL/hr over 30 Minutes Intravenous  Once 11/10/16 1952 11/10/16 1954   11/10/16 2000  vancomycin (VANCOCIN) IVPB 1000 mg/200 mL premix  Status:  Discontinued     1,000 mg 200 mL/hr over 60 Minutes Intravenous  Once 11/10/16 1952 11/10/16 1954   11/10/16 1430  piperacillin-tazobactam (ZOSYN) IVPB 3.375 g     3.375 g 100 mL/hr over 30 Minutes Intravenous  Once 11/10/16 1426 11/10/16 1503   11/10/16 1430  vancomycin (VANCOCIN) IVPB 1000 mg/200 mL premix     1,000 mg 200 mL/hr over 60 Minutes Intravenous  Once 11/10/16 1426 11/10/16 1653       Objective: Vitals:   11/14/16 0500 11/14/16 1158 11/14/16 1325 11/14/16 1529  BP:    103/76  Pulse:    (!) 103  Resp:    20  Temp:  (!) 102.2 F (39 C) 100.1 F (37.8 C) 98.9 F (37.2 C)  TempSrc:  Oral Oral Oral  SpO2:    100%  Weight: 65.3 kg (143 lb 15.4 oz)  Height:        Intake/Output Summary (Last 24 hours) at 11/14/16 1531 Last data filed at 11/14/16 1028  Gross per 24 hour  Intake              360 ml  Output                0 ml  Net              360 ml   Filed Weights   11/12/16 0410 11/13/16 0950 11/14/16 0500  Weight: 60 kg (132 lb 4.4 oz) 65.5 kg (144 lb 6.4 oz) 65.3 kg (143 lb 15.4 oz)    Examination: General exam: Appears comfortable  HEENT: PERRLA, oral mucosa moist, no sclera icterus or thrush Respiratory system: Clear to auscultation. Respiratory effort normal. Cardiovascular system: S1 & S2 heard, RRR.  No murmurs  Gastrointestinal system: Abdomen soft, tender in RUQ, nondistended. Normal bowel sound. No organomegaly Central nervous system: Alert and oriented. No focal neurological deficits. Extremities: No cyanosis, clubbing or edema Skin: No  rashes or ulcers Psychiatry:  Mood & affect appropriate.     Data Reviewed: I have personally reviewed following labs and imaging studies  CBC:  Recent Labs Lab 11/10/16 1342 11/10/16 2119 11/11/16 0306 11/12/16 0352 11/14/16 1110  WBC 14.5* 12.8* 10.5 11.9* 12.7*  NEUTROABS 12.3*  --   --   --   --   HGB 11.2* 9.6* 9.0* 8.4* 8.8*  HCT 32.5* 28.2* 26.4* 24.2* 26.3*  MCV 82.7 83.2 82.5 82.6 85.4  PLT 119* 104* 91* 108* 123*   Basic Metabolic Panel:  Recent Labs Lab 11/10/16 1342 11/10/16 1350 11/10/16 2119 11/11/16 0306 11/12/16 0352 11/13/16 0335 11/14/16 0033  NA 129*  --  137 138 142  --  139  K 3.2*  --  3.1* 3.3* 3.3*  --  4.4  CL 91*  --  107 108 113*  --  114*  CO2 24  --  23 23 24   --  19*  GLUCOSE 127*  --  128* 175* 147*  --  106*  BUN 34*  --  29* 24* 12  --  13  CREATININE 1.36*  --  1.04* 0.68 0.59 0.61 0.74  CALCIUM 8.5*  --  7.4* 7.6* 8.2*  --  7.7*  MG  --  1.5*  --  2.6* 1.7 1.6*  --   PHOS  --   --   --  1.9* 1.5*  --   --    GFR: Estimated Creatinine Clearance: 96.9 mL/min (by C-G formula based on SCr of 0.74 mg/dL). Liver Function Tests:  Recent Labs Lab 11/10/16 1342 11/10/16 2119  AST 38 25  ALT 20 16  ALKPHOS 249* 164*  BILITOT 1.0 0.9  PROT 6.6 5.0*  ALBUMIN 2.8* 2.1*   No results for input(s): LIPASE, AMYLASE in the last 168 hours. No results for input(s): AMMONIA in the last 168 hours. Coagulation Profile:  Recent Labs Lab 11/10/16 2119  INR 1.16   Cardiac Enzymes:  Recent Labs Lab 11/10/16 2119  TROPONINI 0.05*   BNP (last 3 results) No results for input(s): PROBNP in the last 8760 hours. HbA1C: No results for input(s): HGBA1C in the last 72 hours. CBG:  Recent Labs Lab 11/13/16 2049 11/14/16 0007 11/14/16 0359 11/14/16 0729 11/14/16 1151  GLUCAP 93 116* 72 80 120*   Lipid Profile: No results for input(s): CHOL, HDL, LDLCALC, TRIG, CHOLHDL, LDLDIRECT in the last 72 hours. Thyroid Function  Tests: No  results for input(s): TSH, T4TOTAL, FREET4, T3FREE, THYROIDAB in the last 72 hours. Anemia Panel: No results for input(s): VITAMINB12, FOLATE, FERRITIN, TIBC, IRON, RETICCTPCT in the last 72 hours. Urine analysis:    Component Value Date/Time   COLORURINE YELLOW 11/10/2016 2132   APPEARANCEUR CLEAR 11/10/2016 2132   LABSPEC 1.006 11/10/2016 2132   PHURINE 5.0 11/10/2016 2132   GLUCOSEU NEGATIVE 11/10/2016 2132   HGBUR MODERATE (A) 11/10/2016 2132   BILIRUBINUR NEGATIVE 11/10/2016 2132   KETONESUR NEGATIVE 11/10/2016 2132   PROTEINUR NEGATIVE 11/10/2016 2132   UROBILINOGEN 0.2 08/05/2010 1821   NITRITE NEGATIVE 11/10/2016 2132   LEUKOCYTESUR SMALL (A) 11/10/2016 2132   Sepsis Labs: @LABRCNTIP (procalcitonin:4,lacticidven:4) ) Recent Results (from the past 240 hour(s))  Blood Culture (routine x 2)     Status: Abnormal   Collection Time: 11/10/16  2:45 PM  Result Value Ref Range Status   Specimen Description BLOOD LEFT HAND  Final   Special Requests IN PEDIATRIC BOTTLE Blood Culture adequate volume  Final   Culture  Setup Time   Final    GRAM POSITIVE COCCI IN CLUSTERS IN PEDIATRIC BOTTLE CRITICAL VALUE NOTED.  VALUE IS CONSISTENT WITH PREVIOUSLY REPORTED AND CALLED VALUE.    Culture (A)  Final    STAPHYLOCOCCUS AUREUS SUSCEPTIBILITIES PERFORMED ON PREVIOUS CULTURE WITHIN THE LAST 5 DAYS. Performed at [facility], [address], [place], [place] [address]    Report Status 11/13/2016 FINAL  Final  Blood Culture (routine x 2)     Status: Abnormal   Collection Time: 11/10/16  2:55 PM  Result Value Ref Range Status   Specimen Description BLOOD LEFT ARM  Final   Special Requests   Final    BOTTLES DRAWN AEROBIC AND ANAEROBIC Blood Culture adequate volume   Culture  Setup Time   Final    IN BOTH AEROBIC AND ANAEROBIC BOTTLES GRAM POSITIVE COCCI IN CLUSTERS CRITICAL RESULT CALLED TO, READ BACK BY AND VERIFIED WITH: TO JGRIMSLEY(PHARMd) BY TCLEVELAND 11/11/2016 AT  6:15AM Performed at [facility], [address] [place], [place] [address]    Culture METHICILLIN RESISTANT STAPHYLOCOCCUS AUREUS (A)  Final   Report Status 11/13/2016 FINAL  Final   Organism ID, Bacteria METHICILLIN RESISTANT STAPHYLOCOCCUS AUREUS  Final      Susceptibility   Methicillin resistant staphylococcus aureus - MIC*    CIPROFLOXACIN >=8 RESISTANT Resistant     ERYTHROMYCIN >=8 RESISTANT Resistant     GENTAMICIN <=0.5 SENSITIVE Sensitive     OXACILLIN >=4 RESISTANT Resistant     TETRACYCLINE <=1 SENSITIVE Sensitive     VANCOMYCIN 1 SENSITIVE Sensitive     TRIMETH/SULFA <=10 SENSITIVE Sensitive     CLINDAMYCIN <=0.25 SENSITIVE Sensitive     RIFAMPIN <=0.5 SENSITIVE Sensitive     Inducible Clindamycin NEGATIVE Sensitive     * METHICILLIN RESISTANT STAPHYLOCOCCUS AUREUS  Blood Culture ID Panel (Reflexed)     Status: Abnormal   Collection Time: 11/10/16  2:55 PM  Result Value Ref Range Status   Enterococcus species NOT DETECTED NOT DETECTED Final   Listeria monocytogenes NOT DETECTED NOT DETECTED Final   Staphylococcus species DETECTED (A) NOT DETECTED Final    Comment: CRITICAL RESULT CALLED TO, READ BACK BY AND VERIFIED WITH: TO JGRIMSLEY(PHARMd) BY TCLEVELAND 11/11/2016 AT 6:15AM    Staphylococcus aureus DETECTED (A) NOT DETECTED Final    Comment: Methicillin (oxacillin)-resistant Staphylococcus aureus (MRSA). MRSA is predictably resistant to beta-lactam antibiotics (except ceftaroline). Preferred therapy is vancomycin unless  clinically contraindicated. Patient requires contact precautions if  hospitalized. CRITICAL RESULT CALLED TO, READ BACK BY AND VERIFIED WITH: TO JGRIMSLEY(PHARMd) BY TCLEVELAND 11/11/2016 AT 6:15AM    Methicillin resistance DETECTED (A) NOT DETECTED Final    Comment: CRITICAL RESULT CALLED TO, READ BACK BY AND VERIFIED WITH: TO JGRIMSLEY(PHARMd) BY TCLEVELAND 11/11/2016 AT 6:15AM    Streptococcus species NOT DETECTED NOT DETECTED Final    Streptococcus agalactiae NOT DETECTED NOT DETECTED Final   Streptococcus pneumoniae NOT DETECTED NOT DETECTED Final   Streptococcus pyogenes NOT DETECTED NOT DETECTED Final   Acinetobacter baumannii NOT DETECTED NOT DETECTED Final   Enterobacteriaceae species NOT DETECTED NOT DETECTED Final   Enterobacter cloacae complex NOT DETECTED NOT DETECTED Final   Escherichia coli NOT DETECTED NOT DETECTED Final   Klebsiella oxytoca NOT DETECTED NOT DETECTED Final   Klebsiella pneumoniae NOT DETECTED NOT DETECTED Final   Proteus species NOT DETECTED NOT DETECTED Final   Serratia marcescens NOT DETECTED NOT DETECTED Final   Haemophilus influenzae NOT DETECTED NOT DETECTED Final   Neisseria meningitidis NOT DETECTED NOT DETECTED Final   Pseudomonas aeruginosa NOT DETECTED NOT DETECTED Final   Candida albicans NOT DETECTED NOT DETECTED Final   Candida glabrata NOT DETECTED NOT DETECTED Final   Candida krusei NOT DETECTED NOT DETECTED Final   Candida parapsilosis NOT DETECTED NOT DETECTED Final   Candida tropicalis NOT DETECTED NOT DETECTED Final    Comment: Performed at [facility], [address] [place], [place] [address]  Urine culture     Status: None   Collection Time: 11/10/16  9:32 PM  Result Value Ref Range Status   Specimen Description URINE, CLEAN CATCH  Final   Special Requests NONE  Final   Culture   Final    NO GROWTH Performed at [facility], [address] [place], [place] [address]    Report Status 11/12/2016 FINAL  Final  Culture, blood (x 2)     Status: Abnormal   Collection Time: 11/10/16  9:46 PM  Result Value Ref Range Status   Specimen Description BLOOD LEFT ANTECUBITAL  Final   Special Requests IN PEDIATRIC BOTTLE Blood Culture adequate volume  Final   Culture  Setup Time   Final    GRAM POSITIVE COCCI IN CLUSTERS IN PEDIATRIC BOTTLE CRITICAL VALUE NOTED.  VALUE IS CONSISTENT WITH PREVIOUSLY REPORTED AND CALLED VALUE.    Culture (A)  Final     STAPHYLOCOCCUS AUREUS SUSCEPTIBILITIES PERFORMED ON PREVIOUS CULTURE WITHIN THE LAST 5 DAYS. Performed at [facility], [address], [place], [place] [address]    Report Status 11/13/2016 FINAL  Final  Culture, blood (x 2)     Status: Abnormal   Collection Time: 11/10/16  9:46 PM  Result Value Ref Range Status   Specimen Description BLOOD LEFT HAND  Final   Special Requests IN PEDIATRIC BOTTLE Blood Culture adequate volume  Final   Culture  Setup Time   Final    GRAM POSITIVE COCCI IN CLUSTERS IN PEDIATRIC BOTTLE CRITICAL RESULT CALLED TO, READ BACK BY AND VERIFIED WITH: D WOFFORD,PHARMD AT 1353 11/11/16 BY L BENFIELD    Culture (A)  Final    STAPHYLOCOCCUS AUREUS SUSCEPTIBILITIES PERFORMED ON PREVIOUS CULTURE WITHIN THE LAST 5 DAYS. Performed at [facility], [address] [place], [place] [address]    Report Status 11/13/2016 FINAL  Final  MRSA PCR Screening     Status: Abnormal   Collection Time: 11/10/16  9:58  PM  Result Value Ref Range Status   MRSA by PCR POSITIVE (A) NEGATIVE Final    Comment:        The GeneXpert MRSA Assay (FDA approved for NASAL specimens only), is one component of a comprehensive MRSA colonization surveillance program. It is not intended to diagnose MRSA infection nor to guide or monitor treatment for MRSA infections. RESULT CALLED TO, READ BACK BY AND VERIFIED WITH: ABBY CHAVEZ,RN [id number] @ 2357 BY J SCOTTON   Culture, blood (Routine X 2) w Reflex to ID Panel     Status: None (Preliminary result)   Collection Time: 11/11/16 10:30 AM  Result Value Ref Range Status   Specimen Description BLOOD RIGHT HAND  Final   Special Requests   Final    BOTTLES DRAWN AEROBIC AND ANAEROBIC Blood Culture adequate volume IMMUNOCOMPROMISED   Culture  Setup Time   Final    GRAM POSITIVE COCCI IN CLUSTERS AEROBIC BOTTLE ONLY CRITICAL VALUE NOTED.  VALUE IS CONSISTENT WITH PREVIOUSLY REPORTED AND CALLED VALUE. Performed at [facility],  [address], [place], [place] [address]    Culture GRAM POSITIVE COCCI  Final   Report Status PENDING  Incomplete  Culture, blood (Routine X 2) w Reflex to ID Panel     Status: Abnormal   Collection Time: 11/11/16 10:34 AM  Result Value Ref Range Status   Specimen Description BLOOD BLOOD RIGHT HAND  Final   Special Requests   Final    BOTTLES DRAWN AEROBIC AND ANAEROBIC Blood Culture adequate volume   Culture  Setup Time   Final    GRAM POSITIVE COCCI IN CLUSTERS IN BOTH AEROBIC AND ANAEROBIC BOTTLES CRITICAL RESULT CALLED TO, READ BACK BY AND VERIFIED WITH: T. PICKERING PHARMD, AT 0702 11/12/16 BY D. VANHOOK    Culture (A)  Final    STAPHYLOCOCCUS AUREUS SUSCEPTIBILITIES PERFORMED ON PREVIOUS CULTURE WITHIN THE LAST 5 DAYS. Performed at [facility], [address], [place], [place] [address]    Report Status 11/14/2016 FINAL  Final  Culture, blood (Routine X 2) w Reflex to ID Panel     Status: Abnormal (Preliminary result)   Collection Time: 11/12/16  5:42 PM  Result Value Ref Range Status   Specimen Description BLOOD RIGHT ARM  Final   Special Requests IN PEDIATRIC BOTTLE Blood Culture adequate volume  Final   Culture  Setup Time   Final    GRAM POSITIVE COCCI IN CLUSTERS IN PEDIATRIC BOTTLE CRITICAL RESULT CALLED TO, READ BACK BY AND VERIFIED WITHHaze Boyden PHARMD, AT 1478 11/13/16 BY Renato Shin Performed at [facility], [address], [place], [place] [address]    Culture STAPHYLOCOCCUS AUREUS (A)  Final   Report Status PENDING  Incomplete  Culture, blood (Routine X 2) w Reflex to ID Panel     Status: Abnormal (Preliminary result)   Collection Time: 11/12/16  5:43 PM  Result Value Ref Range Status   Specimen Description BLOOD RIGHT ARM  Final   Special Requests   Final    BOTTLES DRAWN AEROBIC AND ANAEROBIC Blood Culture adequate volume   Culture  Setup Time   Final    GRAM POSITIVE COCCI IN CLUSTERS ANAEROBIC BOTTLE ONLY CRITICAL VALUE NOTED.  VALUE IS  CONSISTENT WITH PREVIOUSLY REPORTED AND CALLED VALUE. Performed at [facility], [address] [place], [place] [address]    Culture STAPHYLOCOCCUS AUREUS (A)  Final   Report Status PENDING  Incomplete  Culture,  blood (Routine X 2) w Reflex to ID Panel     Status: None (Preliminary result)   Collection Time: 11/13/16 10:49 AM  Result Value Ref Range Status   Specimen Description BLOOD RIGHT ANTECUBITAL  Final   Special Requests   Final    BOTTLES DRAWN AEROBIC ONLY Blood Culture adequate volume   Culture   Final    NO GROWTH < 24 HOURS Performed at [facility], [address], [place], [place] [address]    Report Status PENDING  Incomplete  Culture, blood (Routine X 2) w Reflex to ID Panel     Status: None (Preliminary result)   Collection Time: 11/13/16 10:52 AM  Result Value Ref Range Status   Specimen Description BLOOD LEFT ARM  Final   Special Requests IN PEDIATRIC BOTTLE Blood Culture adequate volume  Final   Culture  Setup Time   Final    PED GRAM POSITIVE COCCI IN CLUSTERS Organism ID to follow CRITICAL RESULT CALLED TO, READ BACK BY AND VERIFIED WITH: TO BGREEN(PHARMd) BY TCLEVELAND 11/14/2016 AT 5:05AM    Culture   Final    NO GROWTH < 24 HOURS Performed at [facility], [address], [place], [place] [address]    Report Status PENDING  Incomplete  Blood Culture ID Panel (Reflexed)     Status: Abnormal   Collection Time: 11/13/16 10:52 AM  Result Value Ref Range Status   Enterococcus species NOT DETECTED NOT DETECTED Final   Listeria monocytogenes NOT DETECTED NOT DETECTED Final   Staphylococcus species DETECTED (A) NOT DETECTED Final    Comment: CRITICAL RESULT CALLED TO, READ BACK BY AND VERIFIED WITH: TO BGREEN(PHARMd) BY TCLEVELAND 11/14/16 AT 5:05AM    Staphylococcus aureus DETECTED (A) NOT DETECTED Final    Comment: Methicillin (oxacillin)-resistant Staphylococcus aureus (MRSA). MRSA is predictably resistant to beta-lactam antibiotics  (except ceftaroline). Preferred therapy is vancomycin unless clinically contraindicated. Patient requires contact precautions if  hospitalized. CRITICAL RESULT CALLED TO, READ BACK BY AND VERIFIED WITH: TO BGREEN(PHARMd) BY TCLEVELAND 11/14/16 AT 5:05AM    Methicillin resistance DETECTED (A) NOT DETECTED Final    Comment: CRITICAL RESULT CALLED TO, READ BACK BY AND VERIFIED WITH: TO BGREEN(PHARMd) BY TCLEVELAND 11/14/16 AT 5:05AM    Streptococcus species NOT DETECTED NOT DETECTED Final   Streptococcus agalactiae NOT DETECTED NOT DETECTED Final   Streptococcus pneumoniae NOT DETECTED NOT DETECTED Final   Streptococcus pyogenes NOT DETECTED NOT DETECTED Final   Acinetobacter baumannii NOT DETECTED NOT DETECTED Final   Enterobacteriaceae species NOT DETECTED NOT DETECTED Final   Enterobacter cloacae complex NOT DETECTED NOT DETECTED Final   Escherichia coli NOT DETECTED NOT DETECTED Final   Klebsiella oxytoca NOT DETECTED NOT DETECTED Final   Klebsiella pneumoniae NOT DETECTED NOT DETECTED Final   Proteus species NOT DETECTED NOT DETECTED Final   Serratia marcescens NOT DETECTED NOT DETECTED Final   Haemophilus influenzae NOT DETECTED NOT DETECTED Final   Neisseria meningitidis NOT DETECTED NOT DETECTED Final   Pseudomonas aeruginosa NOT DETECTED NOT DETECTED Final   Candida albicans NOT DETECTED NOT DETECTED Final   Candida glabrata NOT DETECTED NOT DETECTED Final   Candida krusei NOT DETECTED NOT DETECTED Final   Candida parapsilosis NOT DETECTED NOT DETECTED Final   Candida tropicalis NOT DETECTED NOT DETECTED Final    Comment: Performed at [facility], [address], [place], [place] [address]         Radiology Studies: No results found.  Scheduled Meds: . iopamidol      . iopamidol      . buprenorphine-naloxone  2 tablet Sublingual Daily  . busPIRone  10 mg Oral BID  . Chlorhexidine Gluconate Cloth  6 each Topical Q0600  . citalopram  10 mg Oral Daily  .  gabapentin  300 mg Oral TID  . heparin  5,000 Units Subcutaneous Q8H  . insulin aspart  2-6 Units Subcutaneous Q4H  . mupirocin ointment  1 application Nasal BID  . nicotine  14 mg Transdermal Daily   Continuous Infusions: . sodium chloride    . 0.9 % NaCl with KCl 40 mEq / L 100 mL/hr (11/14/16 0901)  . vancomycin 1,000 mg (11/14/16 1354)     LOS: 4 days    Time spent in minutes: 35    Calvert Cantor, MD [facility] Pager: [email or web address] Password [email or web address] 11/14/2016, 3:31 PM

## 2016-11-14 NOTE — Progress Notes (Addendum)
Subjective:  C/o right sided abdominal pain hip pain   Antibiotics:  Anti-infectives    Start     Dose/Rate Route Frequency Ordered Stop   11/13/16 1215  vancomycin (VANCOCIN) IVPB 1000 mg/200 mL premix     1,000 mg 200 mL/hr over 60 Minutes Intravenous Every 8 hours 11/13/16 1203     11/11/16 1100  vancomycin (VANCOCIN) IVPB 750 mg/150 ml premix  Status:  Discontinued     750 mg 150 mL/hr over 60 Minutes Intravenous Every 8 hours 11/11/16 0956 11/13/16 1203   11/11/16 0400  vancomycin (VANCOCIN) 500 mg in sodium chloride 0.9 % 100 mL IVPB  Status:  Discontinued     500 mg 100 mL/hr over 60 Minutes Intravenous Every 12 hours 11/10/16 1951 11/11/16 0956   11/10/16 2200  ceFEPIme (MAXIPIME) 1 g in dextrose 5 % 50 mL IVPB  Status:  Discontinued     1 g 100 mL/hr over 30 Minutes Intravenous Every 12 hours 11/10/16 1951 11/11/16 0926   11/10/16 2000  ceFEPIme (MAXIPIME) 2 g in dextrose 5 % 50 mL IVPB  Status:  Discontinued     2 g 100 mL/hr over 30 Minutes Intravenous  Once 11/10/16 1952 11/10/16 1954   11/10/16 2000  vancomycin (VANCOCIN) IVPB 1000 mg/200 mL premix  Status:  Discontinued     1,000 mg 200 mL/hr over 60 Minutes Intravenous  Once 11/10/16 1952 11/10/16 1954   11/10/16 1430  piperacillin-tazobactam (ZOSYN) IVPB 3.375 g     3.375 g 100 mL/hr over 30 Minutes Intravenous  Once 11/10/16 1426 11/10/16 1503   11/10/16 1430  vancomycin (VANCOCIN) IVPB 1000 mg/200 mL premix     1,000 mg 200 mL/hr over 60 Minutes Intravenous  Once 11/10/16 1426 11/10/16 1653      Medications: Scheduled Meds: . buprenorphine-naloxone  2 tablet Sublingual Daily  . busPIRone  10 mg Oral BID  . Chlorhexidine Gluconate Cloth  6 each Topical Q0600  . citalopram  10 mg Oral Daily  . gabapentin  300 mg Oral TID  . heparin  5,000 Units Subcutaneous Q8H  . insulin aspart  2-6 Units Subcutaneous Q4H  . iopamidol      . iopamidol      . mupirocin ointment  1 application Nasal BID  .  nicotine  14 mg Transdermal Daily   Continuous Infusions: . sodium chloride    . vancomycin Stopped (11/14/16 1454)   PRN Meds:.sodium chloride, acetaminophen, albuterol, ALPRAZolam, dicyclomine, docusate, hydrOXYzine, loperamide, methocarbamol, nicotine polacrilex, ondansetron (ZOFRAN) IV, ondansetron    Objective: Weight change:   Intake/Output Summary (Last 24 hours) at 11/14/16 1828 Last data filed at 11/14/16 1639  Gross per 24 hour  Intake              120 ml  Output              900 ml  Net             -780 ml   Blood pressure 103/76, pulse (!) 103, temperature 98.9 F (37.2 C), temperature source Oral, resp. rate 20, height 5\' 3"  (1.6 m), weight 143 lb 15.4 oz (65.3 kg), last menstrual period 10/27/2016, SpO2 100 %. Temp:  [98.8 F (37.1 C)-102.2 F (39 C)] 98.9 F (37.2 C) (05/30 1529) Pulse Rate:  [98-108] 103 (05/30 1529) Resp:  [14-20] 20 (05/30 1529) BP: (103-115)/(67-76) 103/76 (05/30 1529) SpO2:  [97 %-100 %] 100 % (05/30 1529) Weight:  [161  lb 15.4 oz (65.3 kg)] 143 lb 15.4 oz (65.3 kg) (05/30 0500)  Physical Exam: General: Alert and awake, oriented x3, not in any acute distress. HEENT: anicteric sclera, pupils reactive to light and accommodation, EOMI CVS regular rate, normal r,  no murmur rubs or gallops Chest: clear to auscultation bilaterally, no wheezing, rales or rhonchi Abdomen: soft nontender, nondistended, normal bowel sounds, Extremities: no  clubbing or edema noted bilaterally Skin: track marks on leg   Neuro: nonfocal  CBC: CBC Latest Ref Rng & Units 11/14/2016 11/12/2016 11/11/2016  WBC 4.0 - 10.5 K/uL 12.7(H) 11.9(H) 10.5  Hemoglobin 12.0 - 15.0 g/dL 1.6(X) 0.9(U) 0.4(V)  Hematocrit 36.0 - 46.0 % 26.3(L) 24.2(L) 26.4(L)  Platelets 150 - 400 K/uL 123(L) 108(L) 91(L)    BMET  Recent Labs  11/12/16 0352 11/13/16 0335 11/14/16 0033  NA 142  --  139  K 3.3*  --  4.4  CL 113*  --  114*  CO2 24  --  19*  GLUCOSE 147*  --  106*  BUN  12  --  13  CREATININE 0.59 0.61 0.74  CALCIUM 8.2*  --  7.7*     Liver Panel  No results for input(s): PROT, ALBUMIN, AST, ALT, ALKPHOS, BILITOT, BILIDIR, IBILI in the last 72 hours.     Sedimentation Rate No results for input(s): ESRSEDRATE in the last 72 hours. C-Reactive Protein No results for input(s): CRP in the last 72 hours.  Micro Results: Recent Results (from the past 720 hour(s))  Blood Culture (routine x 2)     Status: Abnormal   Collection Time: 11/10/16  2:45 PM  Result Value Ref Range Status   Specimen Description BLOOD LEFT HAND  Final   Special Requests IN PEDIATRIC BOTTLE Blood Culture adequate volume  Final   Culture  Setup Time   Final    GRAM POSITIVE COCCI IN CLUSTERS IN PEDIATRIC BOTTLE CRITICAL VALUE NOTED.  VALUE IS CONSISTENT WITH PREVIOUSLY REPORTED AND CALLED VALUE.    Culture (A)  Final    STAPHYLOCOCCUS AUREUS SUSCEPTIBILITIES PERFORMED ON PREVIOUS CULTURE WITHIN THE LAST 5 DAYS. Performed at [facility], [address], [place], [place] [address]    Report Status 11/13/2016 FINAL  Final  Blood Culture (routine x 2)     Status: Abnormal   Collection Time: 11/10/16  2:55 PM  Result Value Ref Range Status   Specimen Description BLOOD LEFT ARM  Final   Special Requests   Final    BOTTLES DRAWN AEROBIC AND ANAEROBIC Blood Culture adequate volume   Culture  Setup Time   Final    IN BOTH AEROBIC AND ANAEROBIC BOTTLES GRAM POSITIVE COCCI IN CLUSTERS CRITICAL RESULT CALLED TO, READ BACK BY AND VERIFIED WITH: TO JGRIMSLEY(PHARMd) BY TCLEVELAND 11/11/2016 AT 6:15AM Performed at [facility], [address], [place], [place] [address]    Culture METHICILLIN RESISTANT STAPHYLOCOCCUS AUREUS (A)  Final   Report Status 11/13/2016 FINAL  Final   Organism ID, Bacteria METHICILLIN RESISTANT STAPHYLOCOCCUS AUREUS  Final      Susceptibility   Methicillin resistant staphylococcus aureus - MIC*    CIPROFLOXACIN >=8 RESISTANT Resistant      ERYTHROMYCIN >=8 RESISTANT Resistant     GENTAMICIN <=0.5 SENSITIVE Sensitive     OXACILLIN >=4 RESISTANT Resistant     TETRACYCLINE <=1 SENSITIVE Sensitive     VANCOMYCIN 1 SENSITIVE Sensitive     TRIMETH/SULFA <=10 SENSITIVE Sensitive     CLINDAMYCIN <=0.25 SENSITIVE Sensitive  RIFAMPIN <=0.5 SENSITIVE Sensitive     Inducible Clindamycin NEGATIVE Sensitive     * METHICILLIN RESISTANT STAPHYLOCOCCUS AUREUS  Blood Culture ID Panel (Reflexed)     Status: Abnormal   Collection Time: 11/10/16  2:55 PM  Result Value Ref Range Status   Enterococcus species NOT DETECTED NOT DETECTED Final   Listeria monocytogenes NOT DETECTED NOT DETECTED Final   Staphylococcus species DETECTED (A) NOT DETECTED Final    Comment: CRITICAL RESULT CALLED TO, READ BACK BY AND VERIFIED WITH: TO JGRIMSLEY(PHARMd) BY TCLEVELAND 11/11/2016 AT 6:15AM    Staphylococcus aureus DETECTED (A) NOT DETECTED Final    Comment: Methicillin (oxacillin)-resistant Staphylococcus aureus (MRSA). MRSA is predictably resistant to beta-lactam antibiotics (except ceftaroline). Preferred therapy is vancomycin unless clinically contraindicated. Patient requires contact precautions if  hospitalized. CRITICAL RESULT CALLED TO, READ BACK BY AND VERIFIED WITH: TO JGRIMSLEY(PHARMd) BY TCLEVELAND 11/11/2016 AT 6:15AM    Methicillin resistance DETECTED (A) NOT DETECTED Final    Comment: CRITICAL RESULT CALLED TO, READ BACK BY AND VERIFIED WITH: TO JGRIMSLEY(PHARMd) BY TCLEVELAND 11/11/2016 AT 6:15AM    Streptococcus species NOT DETECTED NOT DETECTED Final   Streptococcus agalactiae NOT DETECTED NOT DETECTED Final   Streptococcus pneumoniae NOT DETECTED NOT DETECTED Final   Streptococcus pyogenes NOT DETECTED NOT DETECTED Final   Acinetobacter baumannii NOT DETECTED NOT DETECTED Final   Enterobacteriaceae species NOT DETECTED NOT DETECTED Final   Enterobacter cloacae complex NOT DETECTED NOT DETECTED Final   Escherichia coli NOT DETECTED  NOT DETECTED Final   Klebsiella oxytoca NOT DETECTED NOT DETECTED Final   Klebsiella pneumoniae NOT DETECTED NOT DETECTED Final   Proteus species NOT DETECTED NOT DETECTED Final   Serratia marcescens NOT DETECTED NOT DETECTED Final   Haemophilus influenzae NOT DETECTED NOT DETECTED Final   Neisseria meningitidis NOT DETECTED NOT DETECTED Final   Pseudomonas aeruginosa NOT DETECTED NOT DETECTED Final   Candida albicans NOT DETECTED NOT DETECTED Final   Candida glabrata NOT DETECTED NOT DETECTED Final   Candida krusei NOT DETECTED NOT DETECTED Final   Candida parapsilosis NOT DETECTED NOT DETECTED Final   Candida tropicalis NOT DETECTED NOT DETECTED Final    Comment: Performed at [facility], [address], [place], [place] [address]  Urine culture     Status: None   Collection Time: 11/10/16  9:32 PM  Result Value Ref Range Status   Specimen Description URINE, CLEAN CATCH  Final   Special Requests NONE  Final   Culture   Final    NO GROWTH Performed at [facility], [address] [place], [place] [address]    Report Status 11/12/2016 FINAL  Final  Culture, blood (x 2)     Status: Abnormal   Collection Time: 11/10/16  9:46 PM  Result Value Ref Range Status   Specimen Description BLOOD LEFT ANTECUBITAL  Final   Special Requests IN PEDIATRIC BOTTLE Blood Culture adequate volume  Final   Culture  Setup Time   Final    GRAM POSITIVE COCCI IN CLUSTERS IN PEDIATRIC BOTTLE CRITICAL VALUE NOTED.  VALUE IS CONSISTENT WITH PREVIOUSLY REPORTED AND CALLED VALUE.    Culture (A)  Final    STAPHYLOCOCCUS AUREUS SUSCEPTIBILITIES PERFORMED ON PREVIOUS CULTURE WITHIN THE LAST 5 DAYS. Performed at [facility], [address] [place], [place] [address]    Report Status 11/13/2016 FINAL  Final  Culture, blood (x 2)     Status: Abnormal   Collection Time: 11/10/16  9:46 PM  Result Value Ref Range Status   Specimen Description BLOOD LEFT HAND  Final   Special Requests IN  PEDIATRIC BOTTLE Blood Culture adequate volume  Final   Culture  Setup Time   Final    GRAM POSITIVE COCCI IN CLUSTERS IN PEDIATRIC BOTTLE CRITICAL RESULT CALLED TO, READ BACK BY AND VERIFIED WITH: D WOFFORD,PHARMD AT 1353 11/11/16 BY L BENFIELD    Culture (A)  Final    STAPHYLOCOCCUS AUREUS SUSCEPTIBILITIES PERFORMED ON PREVIOUS CULTURE WITHIN THE LAST 5 DAYS. Performed at [facility], [address] [place], [place] [address]    Report Status 11/13/2016 FINAL  Final  MRSA PCR Screening     Status: Abnormal   Collection Time: 11/10/16  9:58 PM  Result Value Ref Range Status   MRSA by PCR POSITIVE (A) NEGATIVE Final    Comment:        The GeneXpert MRSA Assay (FDA approved for NASAL specimens only), is one component of a comprehensive MRSA colonization surveillance program. It is not intended to diagnose MRSA infection nor to guide or monitor treatment for MRSA infections. RESULT CALLED TO, READ BACK BY AND VERIFIED WITH: ABBY CHAVEZ,RN [id number] @ 2357 BY J SCOTTON   Culture, blood (Routine X 2) w Reflex to ID Panel     Status: None (Preliminary result)   Collection Time: 11/11/16 10:30 AM  Result Value Ref Range Status   Specimen Description BLOOD RIGHT HAND  Final   Special Requests   Final    BOTTLES DRAWN AEROBIC AND ANAEROBIC Blood Culture adequate volume IMMUNOCOMPROMISED   Culture  Setup Time   Final    GRAM POSITIVE COCCI IN CLUSTERS IN BOTH AEROBIC AND ANAEROBIC BOTTLES CRITICAL VALUE NOTED.  VALUE IS CONSISTENT WITH PREVIOUSLY REPORTED AND CALLED VALUE. Performed at [facility], [address] [place], [place] [address]    Culture GRAM POSITIVE COCCI  Final   Report Status PENDING  Incomplete  Culture, blood (Routine X 2) w Reflex to ID Panel     Status: Abnormal   Collection Time: 11/11/16 10:34 AM  Result Value Ref Range Status   Specimen Description BLOOD BLOOD RIGHT HAND  Final   Special Requests   Final    BOTTLES DRAWN AEROBIC AND ANAEROBIC  Blood Culture adequate volume   Culture  Setup Time   Final    GRAM POSITIVE COCCI IN CLUSTERS IN BOTH AEROBIC AND ANAEROBIC BOTTLES CRITICAL RESULT CALLED TO, READ BACK BY AND VERIFIED WITH: T. PICKERING PHARMD, AT 0702 11/12/16 BY D. VANHOOK    Culture (A)  Final    STAPHYLOCOCCUS AUREUS SUSCEPTIBILITIES PERFORMED ON PREVIOUS CULTURE WITHIN THE LAST 5 DAYS. Performed at [facility], [address] [place], [place] [address]    Report Status 11/14/2016 FINAL  Final  Culture, blood (Routine X 2) w Reflex to ID Panel     Status: Abnormal (Preliminary result)   Collection Time: 11/12/16  5:42 PM  Result Value Ref Range Status   Specimen Description BLOOD RIGHT ARM  Final   Special Requests IN PEDIATRIC BOTTLE Blood Culture adequate volume  Final   Culture  Setup Time   Final    GRAM POSITIVE COCCI IN CLUSTERS IN PEDIATRIC BOTTLE CRITICAL RESULT CALLED TO, READ BACK BY AND VERIFIED WITHHaze Boyden PHARMD, AT 1308 11/13/16 BY Renato Shin Performed at [facility], [address], [place], [place] [address]    Culture STAPHYLOCOCCUS AUREUS (A)  Final   Report  Status PENDING  Incomplete  Culture, blood (Routine X 2) w Reflex to ID Panel     Status: Abnormal (Preliminary result)   Collection Time: 11/12/16  5:43 PM  Result Value Ref Range Status   Specimen Description BLOOD RIGHT ARM  Final   Special Requests   Final    BOTTLES DRAWN AEROBIC AND ANAEROBIC Blood Culture adequate volume   Culture  Setup Time   Final    GRAM POSITIVE COCCI IN CLUSTERS ANAEROBIC BOTTLE ONLY CRITICAL VALUE NOTED.  VALUE IS CONSISTENT WITH PREVIOUSLY REPORTED AND CALLED VALUE. Performed at [facility], [address] [place], [place] [address]    Culture STAPHYLOCOCCUS AUREUS (A)  Final   Report Status PENDING  Incomplete  Culture, blood (Routine X 2) w Reflex to ID Panel     Status: None (Preliminary result)   Collection Time: 11/13/16 10:49 AM  Result Value Ref Range Status    Specimen Description BLOOD RIGHT ANTECUBITAL  Final   Special Requests   Final    BOTTLES DRAWN AEROBIC ONLY Blood Culture adequate volume   Culture   Final    NO GROWTH < 24 HOURS Performed at [facility], [address] [place], [place] [address]    Report Status PENDING  Incomplete  Culture, blood (Routine X 2) w Reflex to ID Panel     Status: None (Preliminary result)   Collection Time: 11/13/16 10:52 AM  Result Value Ref Range Status   Specimen Description BLOOD LEFT ARM  Final   Special Requests IN PEDIATRIC BOTTLE Blood Culture adequate volume  Final   Culture  Setup Time   Final    PED GRAM POSITIVE COCCI IN CLUSTERS Organism ID to follow CRITICAL RESULT CALLED TO, READ BACK BY AND VERIFIED WITH: TO BGREEN(PHARMd) BY TCLEVELAND 11/14/2016 AT 5:05AM    Culture   Final    NO GROWTH < 24 HOURS Performed at [facility], [address] [place], [place] [address]    Report Status PENDING  Incomplete  Blood Culture ID Panel (Reflexed)     Status: Abnormal   Collection Time: 11/13/16 10:52 AM  Result Value Ref Range Status   Enterococcus species NOT DETECTED NOT DETECTED Final   Listeria monocytogenes NOT DETECTED NOT DETECTED Final   Staphylococcus species DETECTED (A) NOT DETECTED Final    Comment: CRITICAL RESULT CALLED TO, READ BACK BY AND VERIFIED WITH: TO BGREEN(PHARMd) BY TCLEVELAND 11/14/16 AT 5:05AM    Staphylococcus aureus DETECTED (A) NOT DETECTED Final    Comment: Methicillin (oxacillin)-resistant Staphylococcus aureus (MRSA). MRSA is predictably resistant to beta-lactam antibiotics (except ceftaroline). Preferred therapy is vancomycin unless clinically contraindicated. Patient requires contact precautions if  hospitalized. CRITICAL RESULT CALLED TO, READ BACK BY AND VERIFIED WITH: TO BGREEN(PHARMd) BY TCLEVELAND 11/14/16 AT 5:05AM    Methicillin resistance DETECTED (A) NOT DETECTED Final    Comment: CRITICAL RESULT CALLED TO, READ BACK BY AND VERIFIED  WITH: TO BGREEN(PHARMd) BY TCLEVELAND 11/14/16 AT 5:05AM    Streptococcus species NOT DETECTED NOT DETECTED Final   Streptococcus agalactiae NOT DETECTED NOT DETECTED Final   Streptococcus pneumoniae NOT DETECTED NOT DETECTED Final   Streptococcus pyogenes NOT DETECTED NOT DETECTED Final   Acinetobacter baumannii NOT DETECTED NOT DETECTED Final   Enterobacteriaceae species NOT DETECTED NOT DETECTED Final   Enterobacter cloacae complex NOT DETECTED NOT DETECTED Final   Escherichia coli NOT DETECTED NOT DETECTED Final   Klebsiella oxytoca NOT DETECTED NOT DETECTED Final  Klebsiella pneumoniae NOT DETECTED NOT DETECTED Final   Proteus species NOT DETECTED NOT DETECTED Final   Serratia marcescens NOT DETECTED NOT DETECTED Final   Haemophilus influenzae NOT DETECTED NOT DETECTED Final   Neisseria meningitidis NOT DETECTED NOT DETECTED Final   Pseudomonas aeruginosa NOT DETECTED NOT DETECTED Final   Candida albicans NOT DETECTED NOT DETECTED Final   Candida glabrata NOT DETECTED NOT DETECTED Final   Candida krusei NOT DETECTED NOT DETECTED Final   Candida parapsilosis NOT DETECTED NOT DETECTED Final   Candida tropicalis NOT DETECTED NOT DETECTED Final    Comment: Performed at [facility], [address], [place], [place] [address]    Studies/Results: Ct Abdomen Pelvis W Contrast  Result Date: 11/14/2016 CLINICAL DATA:  Right upper quadrant pain times 2-3 days. Polysubstance abuse. Cholecystectomy. History of endocarditis and septic emboli. EXAM: CT ABDOMEN AND PELVIS WITH CONTRAST TECHNIQUE: Multidetector CT imaging of the abdomen and pelvis was performed using the standard protocol following bolus administration of intravenous contrast. CONTRAST:  80mL ISOVUE-300 IOPAMIDOL (ISOVUE-300) INJECTION 61% COMPARISON:  03/12/2016 chest CT FINDINGS: Lower chest: Small right effusion with several bilateral cavitary opacities in both lower lobes concerning for septic emboli or cavitary  pneumonia. More confluent airspace opacity in the left lower lobe is also noted. The visualized heart is normal in size. There is no pericardial effusion. No large central pulmonary embolus. Hepatobiliary: Normal appearance of the liver without space-occupying mass or biliary dilatation. Cholecystectomy. Pancreas: No pancreatic mass or ductal dilatation. Mild enlargement of the pancreatic head with surrounding peripancreatic fluid is noted. Correlate to exclude a pancreatitis. Spleen: The spleen is enlarged measuring 15.7 x 5.6 x 17 cm (volume = 780 cm^3) and demonstrates a developmental cleft, partially visualized on prior chest CT and therefore believed less likely to represent a laceration. Adrenals/Urinary Tract: Adrenal glands are unremarkable. Kidneys are normal, without renal calculi, solid appearing lesions, or hydronephrosis. There appear to be pair of water attenuating cysts in the lower pole the right kidney measuring up to 2 x 1.3 cm in aggregate. Bladder is unremarkable. Stomach/Bowel: Stomach is within normal limits. Appendix appears normal. No evidence of bowel wall thickening, distention, or inflammatory changes. Vascular/Lymphatic: No significant vascular findings are present. No enlarged abdominal or pelvic lymph nodes. Reproductive: Uterus and bilateral adnexa are unremarkable. Other: Moderate volume of ascites.  Anasarca. Musculoskeletal: No acute or significant osseous findings. IMPRESSION: 1. Bibasilar, multifocal cavitary lesions noted within the visualized lower lobes consistent with septic emboli or possibly cavitary pneumonia. More confluent airspace disease in the left lower lobe without cavitation is also present. There is a small right pleural effusion. 2. Anasarca with moderate volume of ascites noted within the abdomen and pelvis. Question right heart dysfunction/failure. 3. Splenomegaly. 4. Water attenuating cysts in the lower pole the right kidney in aggregate measuring 2 x 1.3 cm.  5. Status post cholecystectomy. Electronically Signed   By: Tollie Eth M.D.   On: 11/14/2016 16:26      Assessment/Plan:  INTERVAL HISTORY:   Still bacteremic, sp CT scan   Active Problems:   Septic embolism (HCC)   Septic shock (HCC)   Encounter for central line placement   Respiratory failure (HCC)   MRSA bacteremia   Hypokalemia   Hypophosphatemia   Endocarditis   Thrombocytopenia (HCC)    Savannah Palmer is a 26 y.o. 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                                            [facility] Staphylococcus aureus bacteremia   Staphylococcus aureus bacteremia (SAB) is associated with a high rate of complications and mortality.  Specific aspects of clinical management are critical to optimizing the outcome of patients with SAB.  Therefore, the [facility] Antimicrobial Management Team [facility]) has initiated an intervention aimed at improving the management of SAB at [facility].  To do so, Infectious Diseases physicians are providing an evidence-based consult for the management of all patients with SAB.     Yes No Comments  Perform follow-up blood cultures (even if the patient is afebrile) to ensure clearance of bacteremia [x]  []  BLOOD CULTURES PERSISTENTLY  POSITIVE EVEN SP POST REMOVAL OF LINE, REPEATING YET AGAIN TODAY  Remove vascular catheter and obtain follow-up blood cultures after the removal of the catheter [x]  []  CENTRAL LINE IS OUT NO CENTRAL LINES X ANOTHER 5 DAYS MINIMUM  Perform echocardiography to evaluate for endocarditis (transthoracic ECHO is 40-50% sensitive, TEE is > 90% sensitive) [x]  []  Please keep in mind, that neither test can definitively EXCLUDE endocarditis, and that should clinical suspicion remain high for endocarditis the patient should then still be treated with an "endocarditis" duration  of therapy = 6 weeks  TTE with vegetation on TV.    WOULD GET TEE AT [place] TOMORROW VS Friday before weekend   Consult electrophysiologist to evaluate implanted cardiac device (pacemaker, ICD) []  []  NA  Ensure source control []  []  Have all abscesses been drained effectively? Have deep seeded infections (septic joints or osteomyelitis) had appropriate surgical debridement?  Source is IVDU  Investigate for "metastatic" sites of infection []  []  Does the patient have ANY symptom or physical exam finding that would suggest a deeper infection (back or neck pain that may be suggestive of vertebral osteomyelitis or epidural abscess, muscle pain that could be a symptom of pyomyositis)?  Keep in mind that for deep seeded infections MRI imaging with contrast is preferred rather than other often insensitive tests such as plain x-rays, especially early in a patient's presentation.  Lungs and heart seem to be main concern right now   Consider MRI of the right hip with contrast    Change antibiotic therapy to vancomycin [x]  []  Beta-lactam antibiotics are preferred for MSSA due to higher cure rates.   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 HIV negative, HCV RNA ND, so she has cleared this from prior infection  #3 Respiratory failure: she had hypoxic respiratory failure due to septic emboli to the lungs from right sided endoarditis  LOS: 4 days   Acey Lav 11/14/2016, 6:28 PM

## 2016-11-14 NOTE — Progress Notes (Signed)
PHARMACY - PHYSICIAN COMMUNICATION CRITICAL VALUE ALERT - BLOOD CULTURE IDENTIFICATION (BCID)  Results for orders placed or performed during the hospital encounter of 11/10/16  Blood Culture ID Panel (Reflexed) (Collected: 11/13/2016 10:52 AM)  Result Value Ref Range   Enterococcus species NOT DETECTED NOT DETECTED   Listeria monocytogenes NOT DETECTED NOT DETECTED   Staphylococcus species DETECTED (A) NOT DETECTED   Staphylococcus aureus DETECTED (A) NOT DETECTED   Methicillin resistance DETECTED (A) NOT DETECTED   Streptococcus species NOT DETECTED NOT DETECTED   Streptococcus agalactiae NOT DETECTED NOT DETECTED   Streptococcus pneumoniae NOT DETECTED NOT DETECTED   Streptococcus pyogenes NOT DETECTED NOT DETECTED   Acinetobacter baumannii NOT DETECTED NOT DETECTED   Enterobacteriaceae species NOT DETECTED NOT DETECTED   Enterobacter cloacae complex NOT DETECTED NOT DETECTED   Escherichia coli NOT DETECTED NOT DETECTED   Klebsiella oxytoca NOT DETECTED NOT DETECTED   Klebsiella pneumoniae NOT DETECTED NOT DETECTED   Proteus species NOT DETECTED NOT DETECTED   Serratia marcescens NOT DETECTED NOT DETECTED   Haemophilus influenzae NOT DETECTED NOT DETECTED   Neisseria meningitidis NOT DETECTED NOT DETECTED   Pseudomonas aeruginosa NOT DETECTED NOT DETECTED   Candida albicans NOT DETECTED NOT DETECTED   Candida glabrata NOT DETECTED NOT DETECTED   Candida krusei NOT DETECTED NOT DETECTED   Candida parapsilosis NOT DETECTED NOT DETECTED   Candida tropicalis NOT DETECTED NOT DETECTED    Name of physician (or Provider) Contacted: Savannah Palmer [id number]  Changes to prescribed antibiotics required: On Vancomycin  Savannah Palmer, Savannah Palmer 11/14/2016  5:11 AM

## 2016-11-15 LAB — BASIC METABOLIC PANEL
Anion gap: 8 (ref 5–15)
BUN: 9 mg/dL (ref 6–20)
CO2: 23 mmol/L (ref 22–32)
CREATININE: 0.72 mg/dL (ref 0.44–1.00)
Calcium: 7.4 mg/dL — ABNORMAL LOW (ref 8.9–10.3)
Chloride: 104 mmol/L (ref 101–111)
GFR calc Af Amer: >60 mL/min (ref >60)
GLUCOSE: 98 mg/dL (ref 65–99)
POTASSIUM: 4.2 mmol/L (ref 3.5–5.1)
SODIUM: 135 mmol/L (ref 135–145)

## 2016-11-15 LAB — VANCOMYCIN, TROUGH: Vancomycin Tr: 19 ug/mL (ref 15–20)

## 2016-11-15 LAB — GLUCOSE, CAPILLARY: GLUCOSE-CAPILLARY: 85 mg/dL (ref 65–99)

## 2016-11-15 LAB — CULTURE, BLOOD (ROUTINE X 2)
SPECIAL REQUESTS: ADEQUATE
Special Requests: ADEQUATE

## 2016-11-15 LAB — CBC
HEMATOCRIT: 26.3 % — AB (ref 36.0–46.0)
Hemoglobin: 8.8 g/dL — ABNORMAL LOW (ref 12.0–15.0)
MCH: 28.1 pg (ref 26.0–34.0)
MCHC: 33.5 g/dL (ref 30.0–36.0)
MCV: 84 fL (ref 78.0–100.0)
Platelets: 132 10*3/uL — ABNORMAL LOW (ref 150–400)
RBC: 3.13 MIL/uL — ABNORMAL LOW (ref 3.87–5.11)
RDW: 15.1 % (ref 11.5–15.5)
WBC: 13 10*3/uL — AB (ref 4.0–10.5)

## 2016-11-15 MED ORDER — SODIUM CHLORIDE 0.9 % IV SOLN
600.0000 mg | Freq: Two times a day (BID) | INTRAVENOUS | Status: DC
  Administered 2016-11-15 – 2016-11-17 (×4): 600 mg via INTRAVENOUS
  Filled 2016-11-15 (×6): qty 600

## 2016-11-15 MED ORDER — IBUPROFEN 200 MG PO TABS
400.0000 mg | ORAL_TABLET | ORAL | Status: DC | PRN
  Administered 2016-11-15: 400 mg via ORAL
  Filled 2016-11-15: qty 2

## 2016-11-15 NOTE — Progress Notes (Signed)
  Date:  Nov 15, 2016  Chart reviewed for concurrent status and case management needs.  Will continue to follow patient progress.26 y/o female with anxiety, depression and heroin abuse who presents with weakness, cough and chest pain. Found to be in septic shock and required pressors. Cultures have grown MRSA. She continues to be febrile with positive blood cultures and central line has been removed. Cultures are being repeated.   Discharge Planning: following for needs  Expected discharge date: [id number]  Marcelle SmilingRhonda Davis, BSN, [place]RN3, [place]CCM   [phone]

## 2016-11-15 NOTE — Progress Notes (Addendum)
Pharmacy Antibiotic Note  Savannah Palmer is a 26 y.o. female admitted on 11/10/2016 after injecting herself with heroin and crack cocaine. Patient being followed by ID and currently on Vancomycin for septic shock due to MRSA bacteremia, TV IE, and septic emboli to the lungs.    Plan:  Vanc trough therapeutic at 19 (goal 15-20) - continue Vancomycin 1g IV q8h  Daily SCr  Monitor cultures, clinical course   Height: 5\' 3"  (160 cm) Weight: 143 lb 15.4 oz (65.3 kg) IBW/kg (Calculated) : 52.4  Temp (24hrs), Avg:101 F (38.3 C), Min:98.4 F (36.9 C), Max:102.9 F (39.4 C)   Recent Labs Lab 11/10/16 1457 11/10/16 1733 11/10/16 2119 11/11/16 0306 11/12/16 0352 11/13/16 0335 11/13/16 1049 11/14/16 0033 11/14/16 1110 11/15/16 0622 11/15/16 2033  WBC  --   --  12.8* 10.5 11.9*  --   --   --  12.7* 13.0*  --   CREATININE  --   --  1.04* 0.68 0.59 0.61  --  0.74  --  0.72  --   LATICACIDVEN 3.26* 2.11* 1.9 1.6  --   --   --   --   --   --   --   VANCOTROUGH  --   --   --   --   --   --  13*  --   --   --  19    Estimated Creatinine Clearance: 96.9 mL/min (by C-G formula based on SCr of 0.72 mg/dL).    No Known Allergies  Antimicrobials this admission: 5/26 Vancomycin >> 5/26 Zosyn x 1 5/26 Cefepime >> 5/27 5/31 Teflaro >>  Dose adjustments this admission: 5/27: Vanc increased to 750mg  IV q8h with improved renal function 5/29 1049 VT = 13 mcg/mL on Vanc 750mg  IV q8h, dose increased to 1g IV q8h  Microbiology results: 5/26 UCx: NGF 5/26 BCx: 4/4 MRSA 5/26 MRSA PCR: positive  5/27 BCx: 2/2 MRSA  5/28 BCx: 2/2 MRSA 5/29 BCx: 1/2 GPC in clusters, BCID = MRSA 5/30 @ 0033 BCx: 1/2 GPC in clusters 5/30 @ 1610 BCx: sent   Thank you for allowing pharmacy to be a part of this patient's care.  Hessie KnowsJustin M Valene Villa, PharmD, BCPS Pager [phone] 11/15/2016 9:42 PM

## 2016-11-15 NOTE — Progress Notes (Signed)
Palliative Medicine RN Note: Dr Phillips OdorGolding with PMT spoke to attending yesterday. Patient's needs are not palliative. Ms Savannah Palmer needs addiction treatment for her IVDU and continued suboxone therapy, neither of which is handled by Palliative Medicine Team. PMT will sign off at this time. Please reconsult if needed.  Savannah ChanceMelanie G. [name], RN, BSN, [facility] SystemCHPN 11/15/2016 8:54 AM Cell [phone] 8:00-4:00 Monday-Friday Office [phone]

## 2016-11-15 NOTE — Progress Notes (Signed)
Pharmacy Antibiotic Note  Savannah Palmer is a 26 y.o. female admitted on 11/10/2016 after injecting herself with heroin and crack cocaine. Patient being followed by ID and currently on Vancomycin for septic shock due to MRSA bacteremia, TV IE, and septic emboli to the lungs.    Plan:  Continue Vancomycin 1g IV q8h  Check Vancomycin trough level prior to 2100 dose tonight. Goal trough level 15-20 mcg/mL.   Daily SCr  Monitor cultures, clinical course.    Height: 5\' 3"  (160 cm) Weight: 143 lb 15.4 oz (65.3 kg) IBW/kg (Calculated) : 52.4  Temp (24hrs), Avg:100.5 F (38.1 C), Min:98.9 F (37.2 C), Max:102.2 F (39 C)   Recent Labs Lab 11/10/16 1457 11/10/16 1733 11/10/16 2119 11/11/16 0306 11/12/16 0352 11/13/16 0335 11/13/16 1049 11/14/16 0033 11/14/16 1110 11/15/16 0622  WBC  --   --  12.8* 10.5 11.9*  --   --   --  12.7* 13.0*  CREATININE  --   --  1.04* 0.68 0.59 0.61  --  0.74  --  0.72  LATICACIDVEN 3.26* 2.11* 1.9 1.6  --   --   --   --   --   --   VANCOTROUGH  --   --   --   --   --   --  13*  --   --   --     Estimated Creatinine Clearance: 96.9 mL/min (by C-G formula based on SCr of 0.72 mg/dL).    No Known Allergies  Antimicrobials this admission: 5/26 Vancomycin >> 5/26 Zosyn x 1 5/26 Cefepime >> 5/27  Dose adjustments this admission: 5/27: Vanc increased to 750mg  IV q8h with improved renal function 5/29 1049 VT = 13 mcg/mL on Vanc 750mg  IV q8h, dose increased to 1g IV q8h  Microbiology results: 5/26 UCx: NGF 5/26 BCx: 4/4 MRSA 5/26 MRSA PCR: positive  5/27 BCx: 2/2 MRSA  5/28 BCx: 2/2 MRSA 5/29 BCx: 1/2 GPC in clusters, BCID = MRSA 5/30 @ 0033 BCx: 1/2 GPC in clusters 5/30 @ 1610 BCx: sent   Thank you for allowing pharmacy to be a part of this patient's care.    Greer PickerelJigna Henri Guedes, PharmD, BCPS Pager: [phone] 11/15/2016 7:58 AM

## 2016-11-15 NOTE — Progress Notes (Signed)
PROGRESS NOTE    Savannah Palmer   UXL:[id number]  DOB: [birth year]  DOA: 11/10/2016 PCP: Patient, No Pcp Per   Brief Narrative:  26 y/o female with anxiety, depression and heroin abuse who presents with weakness, cough and chest pain. Found to be in septic shock and required pressors. Cultures have grown MRSA. She continues to be febrile with positive blood cultures and central line has been removed. Cultures are being repeated.   Subjective: Pain in right upper abdomen is not as severe today. Has been having fevers over night. No other complaints. Her mother mentions that the patient had a "Syphilus test" that was positive recently. Per patient, it was checked while she was in jail. Her mother would like her tested again.   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 - HIV negative, hepatitis panel negative - ID assisting with management - no central line for now  RUQ pain - obtained CT scan- no noted abnormalities in liver or gallbladder- has ascites-   Ascites - likely due to TR- limit IVF and follow    Hypokalemia   Hypophosphatemia   Hypomagnesemia - replete PRN     Thrombocytopenia   - mild - cont to follow  Heroin abuse - cont Suboxone  DVT prophylaxis: Heparin Code Status: Full code Family Communication: mother Disposition Plan: follow in hospital  Consultants:   ID Procedures:     Antimicrobials:  Anti-infectives    Start     Dose/Rate Route Frequency Ordered Stop   11/13/16 1215  vancomycin (VANCOCIN) IVPB 1000 mg/200 mL premix     1,000 mg 200 mL/hr over 60 Minutes Intravenous Every 8 hours  11/13/16 1203     11/11/16 1100  vancomycin (VANCOCIN) IVPB 750 mg/150 ml premix  Status:  Discontinued     750 mg 150 mL/hr over 60 Minutes Intravenous Every 8 hours 11/11/16 0956 11/13/16 1203   11/11/16 0400  vancomycin (VANCOCIN) 500 mg in sodium chloride 0.9 % 100 mL IVPB  Status:  Discontinued     500 mg 100 mL/hr over 60 Minutes Intravenous Every 12 hours 11/10/16 1951 11/11/16 0956   11/10/16 2200  ceFEPIme (MAXIPIME) 1 g in dextrose 5 % 50 mL IVPB  Status:  Discontinued     1 g 100 mL/hr over 30 Minutes Intravenous Every 12 hours 11/10/16 1951 11/11/16 0926   11/10/16 2000  ceFEPIme (MAXIPIME) 2 g in dextrose 5 % 50 mL IVPB  Status:  Discontinued     2 g 100 mL/hr over 30 Minutes Intravenous  Once 11/10/16 1952 11/10/16 1954   11/10/16 2000  vancomycin (VANCOCIN) IVPB 1000 mg/200 mL premix  Status:  Discontinued     1,000 mg 200 mL/hr over 60 Minutes Intravenous  Once 11/10/16 1952 11/10/16 1954   11/10/16 1430  piperacillin-tazobactam (ZOSYN) IVPB 3.375 g     3.375 g 100 mL/hr over 30 Minutes Intravenous  Once 11/10/16 1426 11/10/16 1503   11/10/16 1430  vancomycin (VANCOCIN) IVPB 1000 mg/200 mL premix     1,000 mg 200 mL/hr over 60 Minutes Intravenous  Once 11/10/16 1426 11/10/16 1653  Objective: Vitals:   11/15/16 0445 11/15/16 0611 11/15/16 0858 11/15/16 1452  BP:  122/71  110/74  Pulse:  100  (!) 142  Resp: (!) 36 (!) 22  (!) 25  Temp: 99.9 F (37.7 C) (!) 101.3 F (38.5 C) 99.7 F (37.6 C) (!) 102.7 F (39.3 C)  TempSrc: Oral Oral Oral Oral  SpO2:  100%  93%  Weight:      Height:        Intake/Output Summary (Last 24 hours) at 11/15/16 1457 Last data filed at 11/15/16 1030  Gross per 24 hour  Intake              440 ml  Output             5300 ml  Net            -4860 ml   Filed Weights   11/12/16 0410 11/13/16 0950 11/14/16 0500  Weight: 60 kg (132 lb 4.4 oz) 65.5 kg (144 lb 6.4 oz) 65.3 kg (143 lb 15.4 oz)    Examination: General exam:  Appears comfortable  HEENT: PERRLA, oral mucosa moist, no sclera icterus or thrush Respiratory system: Clear to auscultation. Respiratory effort normal. Cardiovascular system: S1 & S2 heard, RRR.  No murmurs  Gastrointestinal system: Abdomen soft, less tender in RUQ, nondistended. Normal bowel sound. No organomegaly Central nervous system: Alert and oriented. No focal neurological deficits. Extremities: No cyanosis, clubbing - left arm is more swollen than right arm (around IV site) Skin: No rashes or ulcers Psychiatry:  Mood & affect appropriate.     Data Reviewed: I have personally reviewed following labs and imaging studies  CBC:  Recent Labs Lab 11/10/16 1342 11/10/16 2119 11/11/16 0306 11/12/16 0352 11/14/16 1110 11/15/16 0622  WBC 14.5* 12.8* 10.5 11.9* 12.7* 13.0*  NEUTROABS 12.3*  --   --   --   --   --   HGB 11.2* 9.6* 9.0* 8.4* 8.8* 8.8*  HCT 32.5* 28.2* 26.4* 24.2* 26.3* 26.3*  MCV 82.7 83.2 82.5 82.6 85.4 84.0  PLT 119* 104* 91* 108* 123* 132*   Basic Metabolic Panel:  Recent Labs Lab 11/10/16 1350 11/10/16 2119 11/11/16 0306 11/12/16 0352 11/13/16 0335 11/14/16 0033 11/15/16 0622  NA  --  137 138 142  --  139 135  K  --  3.1* 3.3* 3.3*  --  4.4 4.2  CL  --  107 108 113*  --  114* 104  CO2  --  23 23 24   --  19* 23  GLUCOSE  --  128* 175* 147*  --  106* 98  BUN  --  29* 24* 12  --  13 9  CREATININE  --  1.04* 0.68 0.59 0.61 0.74 0.72  CALCIUM  --  7.4* 7.6* 8.2*  --  7.7* 7.4*  MG 1.5*  --  2.6* 1.7 1.6*  --   --   PHOS  --   --  1.9* 1.5*  --   --   --    GFR: Estimated Creatinine Clearance: 96.9 mL/min (by C-G formula based on SCr of 0.72 mg/dL). Liver Function Tests:  Recent Labs Lab 11/10/16 1342 11/10/16 2119  AST 38 25  ALT 20 16  ALKPHOS 249* 164*  BILITOT 1.0 0.9  PROT 6.6 5.0*  ALBUMIN 2.8* 2.1*   No results for input(s): LIPASE, AMYLASE in the last 168 hours. No results for input(s): AMMONIA in the last 168 hours. Coagulation  Profile:  Recent  Labs Lab 11/10/16 2119  INR 1.16   Cardiac Enzymes:  Recent Labs Lab 11/10/16 2119  TROPONINI 0.05*   BNP (last 3 results) No results for input(s): PROBNP in the last 8760 hours. HbA1C: No results for input(s): HGBA1C in the last 72 hours. CBG:  Recent Labs Lab 11/14/16 1151 11/14/16 1639 11/14/16 1947 11/15/16 0002 11/15/16 0748  GLUCAP 120* 84 92 109* 85   Lipid Profile: No results for input(s): CHOL, HDL, LDLCALC, TRIG, CHOLHDL, LDLDIRECT in the last 72 hours. Thyroid Function Tests: No results for input(s): TSH, T4TOTAL, FREET4, T3FREE, THYROIDAB in the last 72 hours. Anemia Panel: No results for input(s): VITAMINB12, FOLATE, FERRITIN, TIBC, IRON, RETICCTPCT in the last 72 hours. Urine analysis:    Component Value Date/Time   COLORURINE YELLOW 11/10/2016 2132   APPEARANCEUR CLEAR 11/10/2016 2132   LABSPEC 1.006 11/10/2016 2132   PHURINE 5.0 11/10/2016 2132   GLUCOSEU NEGATIVE 11/10/2016 2132   HGBUR MODERATE (A) 11/10/2016 2132   BILIRUBINUR NEGATIVE 11/10/2016 2132   KETONESUR NEGATIVE 11/10/2016 2132   PROTEINUR NEGATIVE 11/10/2016 2132   UROBILINOGEN 0.2 08/05/2010 1821   NITRITE NEGATIVE 11/10/2016 2132   LEUKOCYTESUR SMALL (A) 11/10/2016 2132   Sepsis Labs: @LABRCNTIP (procalcitonin:4,lacticidven:4) ) Recent Results (from the past 240 hour(s))  Blood Culture (routine x 2)     Status: Abnormal   Collection Time: 11/10/16  2:45 PM  Result Value Ref Range Status   Specimen Description BLOOD LEFT HAND  Final   Special Requests IN PEDIATRIC BOTTLE Blood Culture adequate volume  Final   Culture  Setup Time   Final    GRAM POSITIVE COCCI IN CLUSTERS IN PEDIATRIC BOTTLE CRITICAL VALUE NOTED.  VALUE IS CONSISTENT WITH PREVIOUSLY REPORTED AND CALLED VALUE.    Culture (A)  Final    STAPHYLOCOCCUS AUREUS SUSCEPTIBILITIES PERFORMED ON PREVIOUS CULTURE WITHIN THE LAST 5 DAYS. Performed at [facility], [address]  [place], [place] [address]    Report Status 11/13/2016 FINAL  Final  Blood Culture (routine x 2)     Status: Abnormal   Collection Time: 11/10/16  2:55 PM  Result Value Ref Range Status   Specimen Description BLOOD LEFT ARM  Final   Special Requests   Final    BOTTLES DRAWN AEROBIC AND ANAEROBIC Blood Culture adequate volume   Culture  Setup Time   Final    IN BOTH AEROBIC AND ANAEROBIC BOTTLES GRAM POSITIVE COCCI IN CLUSTERS CRITICAL RESULT CALLED TO, READ BACK BY AND VERIFIED WITH: TO JGRIMSLEY(PHARMd) BY TCLEVELAND 11/11/2016 AT 6:15AM Performed at [facility], [address], [place], [place] [address]    Culture METHICILLIN RESISTANT STAPHYLOCOCCUS AUREUS (A)  Final   Report Status 11/13/2016 FINAL  Final   Organism ID, Bacteria METHICILLIN RESISTANT STAPHYLOCOCCUS AUREUS  Final      Susceptibility   Methicillin resistant staphylococcus aureus - MIC*    CIPROFLOXACIN >=8 RESISTANT Resistant     ERYTHROMYCIN >=8 RESISTANT Resistant     GENTAMICIN <=0.5 SENSITIVE Sensitive     OXACILLIN >=4 RESISTANT Resistant     TETRACYCLINE <=1 SENSITIVE Sensitive     VANCOMYCIN 1 SENSITIVE Sensitive     TRIMETH/SULFA <=10 SENSITIVE Sensitive     CLINDAMYCIN <=0.25 SENSITIVE Sensitive     RIFAMPIN <=0.5 SENSITIVE Sensitive     Inducible Clindamycin NEGATIVE Sensitive     * METHICILLIN RESISTANT STAPHYLOCOCCUS AUREUS  Blood Culture ID Panel (Reflexed)     Status: Abnormal   Collection Time: 11/10/16  2:55 PM  Result Value Ref Range Status   Enterococcus species NOT DETECTED NOT DETECTED Final   Listeria monocytogenes NOT DETECTED NOT DETECTED Final   Staphylococcus species DETECTED (A) NOT DETECTED Final    Comment: CRITICAL RESULT CALLED TO, READ BACK BY AND VERIFIED WITH: TO JGRIMSLEY(PHARMd) BY TCLEVELAND 11/11/2016 AT 6:15AM    Staphylococcus aureus DETECTED (A) NOT DETECTED Final    Comment: Methicillin (oxacillin)-resistant Staphylococcus aureus (MRSA). MRSA is predictably resistant  to beta-lactam antibiotics (except ceftaroline). Preferred therapy is vancomycin unless clinically contraindicated. Patient requires contact precautions if  hospitalized. CRITICAL RESULT CALLED TO, READ BACK BY AND VERIFIED WITH: TO JGRIMSLEY(PHARMd) BY TCLEVELAND 11/11/2016 AT 6:15AM    Methicillin resistance DETECTED (A) NOT DETECTED Final    Comment: CRITICAL RESULT CALLED TO, READ BACK BY AND VERIFIED WITH: TO JGRIMSLEY(PHARMd) BY TCLEVELAND 11/11/2016 AT 6:15AM    Streptococcus species NOT DETECTED NOT DETECTED Final   Streptococcus agalactiae NOT DETECTED NOT DETECTED Final   Streptococcus pneumoniae NOT DETECTED NOT DETECTED Final   Streptococcus pyogenes NOT DETECTED NOT DETECTED Final   Acinetobacter baumannii NOT DETECTED NOT DETECTED Final   Enterobacteriaceae species NOT DETECTED NOT DETECTED Final   Enterobacter cloacae complex NOT DETECTED NOT DETECTED Final   Escherichia coli NOT DETECTED NOT DETECTED Final   Klebsiella oxytoca NOT DETECTED NOT DETECTED Final   Klebsiella pneumoniae NOT DETECTED NOT DETECTED Final   Proteus species NOT DETECTED NOT DETECTED Final   Serratia marcescens NOT DETECTED NOT DETECTED Final   Haemophilus influenzae NOT DETECTED NOT DETECTED Final   Neisseria meningitidis NOT DETECTED NOT DETECTED Final   Pseudomonas aeruginosa NOT DETECTED NOT DETECTED Final   Candida albicans NOT DETECTED NOT DETECTED Final   Candida glabrata NOT DETECTED NOT DETECTED Final   Candida krusei NOT DETECTED NOT DETECTED Final   Candida parapsilosis NOT DETECTED NOT DETECTED Final   Candida tropicalis NOT DETECTED NOT DETECTED Final    Comment: Performed at [facility], [address], [place], [place] [address]  Urine culture     Status: None   Collection Time: 11/10/16  9:32 PM  Result Value Ref Range Status   Specimen Description URINE, CLEAN CATCH  Final   Special Requests NONE  Final   Culture   Final    NO GROWTH Performed at [facility]  Lab, [address], [place], [place] [address]    Report Status 11/12/2016 FINAL  Final  Culture, blood (x 2)     Status: Abnormal   Collection Time: 11/10/16  9:46 PM  Result Value Ref Range Status   Specimen Description BLOOD LEFT ANTECUBITAL  Final   Special Requests IN PEDIATRIC BOTTLE Blood Culture adequate volume  Final   Culture  Setup Time   Final    GRAM POSITIVE COCCI IN CLUSTERS IN PEDIATRIC BOTTLE CRITICAL VALUE NOTED.  VALUE IS CONSISTENT WITH PREVIOUSLY REPORTED AND CALLED VALUE.    Culture (A)  Final    STAPHYLOCOCCUS AUREUS SUSCEPTIBILITIES PERFORMED ON PREVIOUS CULTURE WITHIN THE LAST 5 DAYS. Performed at [facility] Lab, [address], [place], [place] [address]    Report Status 11/13/2016 FINAL  Final  Culture, blood (x 2)     Status: Abnormal   Collection Time: 11/10/16  9:46 PM  Result Value Ref Range Status   Specimen Description BLOOD LEFT HAND  Final   Special Requests IN PEDIATRIC BOTTLE Blood Culture adequate volume  Final   Culture  Setup Time   Final  GRAM POSITIVE COCCI IN CLUSTERS IN PEDIATRIC BOTTLE CRITICAL RESULT CALLED TO, READ BACK BY AND VERIFIED WITH: D WOFFORD,PHARMD AT 1353 11/11/16 BY L BENFIELD    Culture (A)  Final    STAPHYLOCOCCUS AUREUS SUSCEPTIBILITIES PERFORMED ON PREVIOUS CULTURE WITHIN THE LAST 5 DAYS. Performed at [facility], [address], [place], [place] [address]    Report Status 11/13/2016 FINAL  Final  MRSA PCR Screening     Status: Abnormal   Collection Time: 11/10/16  9:58 PM  Result Value Ref Range Status   MRSA by PCR POSITIVE (A) NEGATIVE Final    Comment:        The GeneXpert MRSA Assay (FDA approved for NASAL specimens only), is one component of a comprehensive MRSA colonization surveillance program. It is not intended to diagnose MRSA infection nor to guide or monitor treatment for MRSA infections. RESULT CALLED TO, READ BACK BY AND VERIFIED WITH: ABBY CHAVEZ,RN [id number] @ 2357 BY J SCOTTON     Culture, blood (Routine X 2) w Reflex to ID Panel     Status: Abnormal (Preliminary result)   Collection Time: 11/11/16 10:30 AM  Result Value Ref Range Status   Specimen Description BLOOD RIGHT HAND  Final   Special Requests   Final    BOTTLES DRAWN AEROBIC AND ANAEROBIC Blood Culture adequate volume IMMUNOCOMPROMISED   Culture  Setup Time   Final    GRAM POSITIVE COCCI IN CLUSTERS IN BOTH AEROBIC AND ANAEROBIC BOTTLES CRITICAL VALUE NOTED.  VALUE IS CONSISTENT WITH PREVIOUSLY REPORTED AND CALLED VALUE. Performed at [facility] Lab, [address], [place], [place] [address]    Culture STAPHYLOCOCCUS AUREUS (A)  Final   Report Status PENDING  Incomplete  Culture, blood (Routine X 2) w Reflex to ID Panel     Status: Abnormal   Collection Time: 11/11/16 10:34 AM  Result Value Ref Range Status   Specimen Description BLOOD BLOOD RIGHT HAND  Final   Special Requests   Final    BOTTLES DRAWN AEROBIC AND ANAEROBIC Blood Culture adequate volume   Culture  Setup Time   Final    GRAM POSITIVE COCCI IN CLUSTERS IN BOTH AEROBIC AND ANAEROBIC BOTTLES CRITICAL RESULT CALLED TO, READ BACK BY AND VERIFIED WITH: T. PICKERING PHARMD, AT 0702 11/12/16 BY D. VANHOOK    Culture (A)  Final    STAPHYLOCOCCUS AUREUS SUSCEPTIBILITIES PERFORMED ON PREVIOUS CULTURE WITHIN THE LAST 5 DAYS. Performed at [facility] Lab, [address] [place], [place] [address]    Report Status 11/14/2016 FINAL  Final  Culture, blood (Routine X 2) w Reflex to ID Panel     Status: Abnormal   Collection Time: 11/12/16  5:42 PM  Result Value Ref Range Status   Specimen Description BLOOD RIGHT ARM  Final   Special Requests IN PEDIATRIC BOTTLE Blood Culture adequate volume  Final   Culture  Setup Time   Final    GRAM POSITIVE COCCI IN CLUSTERS IN PEDIATRIC BOTTLE CRITICAL RESULT CALLED TO, READ BACK BY AND VERIFIED WITH: N. GLOGOVAC PHARMD, AT 9562 11/13/16 BY D. VANHOOK    Culture (A)  Final    STAPHYLOCOCCUS  AUREUS SUSCEPTIBILITIES PERFORMED ON PREVIOUS CULTURE WITHIN THE LAST 5 DAYS. Performed at [facility], [address], [place], [place] [address]    Report Status 11/15/2016 FINAL  Final  Culture, blood (Routine X 2) w Reflex to ID Panel     Status: Abnormal   Collection Time:  11/12/16  5:43 PM  Result Value Ref Range Status   Specimen Description BLOOD RIGHT ARM  Final   Special Requests   Final    BOTTLES DRAWN AEROBIC AND ANAEROBIC Blood Culture adequate volume   Culture  Setup Time   Final    GRAM POSITIVE COCCI IN CLUSTERS ANAEROBIC BOTTLE ONLY CRITICAL VALUE NOTED.  VALUE IS CONSISTENT WITH PREVIOUSLY REPORTED AND CALLED VALUE.    Culture (A)  Final    STAPHYLOCOCCUS AUREUS SUSCEPTIBILITIES PERFORMED ON PREVIOUS CULTURE WITHIN THE LAST 5 DAYS. Performed at [facility], [address] [place], [place] [address]    Report Status 11/15/2016 FINAL  Final  Culture, blood (Routine X 2) w Reflex to ID Panel     Status: None (Preliminary result)   Collection Time: 11/13/16 10:49 AM  Result Value Ref Range Status   Specimen Description BLOOD RIGHT ANTECUBITAL  Final   Special Requests   Final    BOTTLES DRAWN AEROBIC ONLY Blood Culture adequate volume   Culture   Final    NO GROWTH < 24 HOURS Performed at [facility], [address] [place], [place] [address]    Report Status PENDING  Incomplete  Culture, blood (Routine X 2) w Reflex to ID Panel     Status: None (Preliminary result)   Collection Time: 11/13/16 10:52 AM  Result Value Ref Range Status   Specimen Description BLOOD LEFT ARM  Final   Special Requests IN PEDIATRIC BOTTLE Blood Culture adequate volume  Final   Culture  Setup Time   Final    PED GRAM POSITIVE COCCI IN CLUSTERS Organism ID to follow CRITICAL RESULT CALLED TO, READ BACK BY AND VERIFIED WITH: TO BGREEN(PHARMd) BY TCLEVELAND 11/14/2016 AT 5:05AM    Culture   Final    NO GROWTH < 24 HOURS Performed at [facility], [address] [place], [place] [address]    Report Status PENDING  Incomplete  Blood Culture ID Panel (Reflexed)     Status: Abnormal   Collection Time: 11/13/16 10:52 AM  Result Value Ref Range Status   Enterococcus species NOT DETECTED NOT DETECTED Final   Listeria monocytogenes NOT DETECTED NOT DETECTED Final   Staphylococcus species DETECTED (A) NOT DETECTED Final    Comment: CRITICAL RESULT CALLED TO, READ BACK BY AND VERIFIED WITH: TO BGREEN(PHARMd) BY TCLEVELAND 11/14/16 AT 5:05AM    Staphylococcus aureus DETECTED (A) NOT DETECTED Final    Comment: Methicillin (oxacillin)-resistant Staphylococcus aureus (MRSA). MRSA is predictably resistant to beta-lactam antibiotics (except ceftaroline). Preferred therapy is vancomycin unless clinically contraindicated. Patient requires contact precautions if  hospitalized. CRITICAL RESULT CALLED TO, READ BACK BY AND VERIFIED WITH: TO BGREEN(PHARMd) BY TCLEVELAND 11/14/16 AT 5:05AM    Methicillin resistance DETECTED (A) NOT DETECTED Final    Comment: CRITICAL RESULT CALLED TO, READ BACK BY AND VERIFIED WITH: TO BGREEN(PHARMd) BY TCLEVELAND 11/14/16 AT 5:05AM    Streptococcus species NOT DETECTED NOT DETECTED Final   Streptococcus agalactiae NOT DETECTED NOT DETECTED Final   Streptococcus pneumoniae NOT DETECTED NOT DETECTED Final   Streptococcus pyogenes NOT DETECTED NOT DETECTED Final   Acinetobacter baumannii NOT DETECTED NOT DETECTED Final   Enterobacteriaceae species NOT DETECTED NOT DETECTED Final   Enterobacter cloacae complex NOT DETECTED NOT DETECTED Final   Escherichia coli NOT DETECTED NOT DETECTED Final   Klebsiella oxytoca NOT DETECTED NOT DETECTED Final   Klebsiella pneumoniae NOT DETECTED NOT DETECTED Final   Proteus species NOT DETECTED  NOT DETECTED Final   Serratia marcescens NOT DETECTED NOT DETECTED Final   Haemophilus influenzae NOT DETECTED NOT DETECTED Final   Neisseria meningitidis NOT DETECTED NOT DETECTED Final   Pseudomonas  aeruginosa NOT DETECTED NOT DETECTED Final   Candida albicans NOT DETECTED NOT DETECTED Final   Candida glabrata NOT DETECTED NOT DETECTED Final   Candida krusei NOT DETECTED NOT DETECTED Final   Candida parapsilosis NOT DETECTED NOT DETECTED Final   Candida tropicalis NOT DETECTED NOT DETECTED Final    Comment: Performed at [facility], [address] [place], [place] [address]  Culture, blood (Routine X 2) w Reflex to ID Panel     Status: Abnormal (Preliminary result)   Collection Time: 11/14/16 12:33 AM  Result Value Ref Range Status   Specimen Description BLOOD LEFT HAND  Final   Special Requests IN PEDIATRIC BOTTLE Blood Culture adequate volume  Final   Culture  Setup Time   Final    GRAM POSITIVE COCCI IN CLUSTERS IN PEDIATRIC BOTTLE CRITICAL VALUE NOTED.  VALUE IS CONSISTENT WITH PREVIOUSLY REPORTED AND CALLED VALUE. Performed at [facility], [address] [place], [place] [address]    Culture STAPHYLOCOCCUS AUREUS (A)  Final   Report Status PENDING  Incomplete  Culture, blood (Routine X 2) w Reflex to ID Panel     Status: None (Preliminary result)   Collection Time: 11/14/16  4:10 PM  Result Value Ref Range Status   Specimen Description BLOOD RIGHT ARM  Final   Special Requests IN PEDIATRIC BOTTLE Blood Culture adequate volume  Final   Culture  Setup Time   Final    IN PEDIATRIC BOTTLE GRAM POSITIVE COCCI IN CLUSTERS CRITICAL RESULT CALLED TO, READ BACK BY AND VERIFIED WITH: PHARMD A PHAM [id number] 1036AM MLM Performed at [facility], [address] [place], [place] [address]    Culture GRAM POSITIVE COCCI  Final   Report Status PENDING  Incomplete         Radiology Studies: Ct Abdomen Pelvis W Contrast  Result Date: 11/14/2016 CLINICAL DATA:  Right upper quadrant pain times 2-3 days. Polysubstance abuse. Cholecystectomy. History of endocarditis and septic emboli. EXAM: CT ABDOMEN AND PELVIS WITH CONTRAST TECHNIQUE: Multidetector CT imaging of  the abdomen and pelvis was performed using the standard protocol following bolus administration of intravenous contrast. CONTRAST:  80mL ISOVUE-300 IOPAMIDOL (ISOVUE-300) INJECTION 61% COMPARISON:  03/12/2016 chest CT FINDINGS: Lower chest: Small right effusion with several bilateral cavitary opacities in both lower lobes concerning for septic emboli or cavitary pneumonia. More confluent airspace opacity in the left lower lobe is also noted. The visualized heart is normal in size. There is no pericardial effusion. No large central pulmonary embolus. Hepatobiliary: Normal appearance of the liver without space-occupying mass or biliary dilatation. Cholecystectomy. Pancreas: No pancreatic mass or ductal dilatation. Mild enlargement of the pancreatic head with surrounding peripancreatic fluid is noted. Correlate to exclude a pancreatitis. Spleen: The spleen is enlarged measuring 15.7 x 5.6 x 17 cm (volume = 780 cm^3) and demonstrates a developmental cleft, partially visualized on prior chest CT and therefore believed less likely to represent a laceration. Adrenals/Urinary Tract: Adrenal glands are unremarkable. Kidneys are normal, without renal calculi, solid appearing lesions, or hydronephrosis. There appear to be pair of water attenuating cysts in the lower pole the right kidney measuring up to 2 x 1.3 cm in aggregate. Bladder is unremarkable. Stomach/Bowel: Stomach is within normal limits. Appendix appears normal. No evidence of bowel  wall thickening, distention, or inflammatory changes. Vascular/Lymphatic: No significant vascular findings are present. No enlarged abdominal or pelvic lymph nodes. Reproductive: Uterus and bilateral adnexa are unremarkable. Other: Moderate volume of ascites.  Anasarca. Musculoskeletal: No acute or significant osseous findings. IMPRESSION: 1. Bibasilar, multifocal cavitary lesions noted within the visualized lower lobes consistent with septic emboli or possibly cavitary pneumonia. More  confluent airspace disease in the left lower lobe without cavitation is also present. There is a small right pleural effusion. 2. Anasarca with moderate volume of ascites noted within the abdomen and pelvis. Question right heart dysfunction/failure. 3. Splenomegaly. 4. Water attenuating cysts in the lower pole the right kidney in aggregate measuring 2 x 1.3 cm. 5. Status post cholecystectomy. Electronically Signed   By: Tollie Eth M.D.   On: 11/14/2016 16:26      Scheduled Meds: . buprenorphine-naloxone  2 tablet Sublingual Daily  . busPIRone  10 mg Oral BID  . Chlorhexidine Gluconate Cloth  6 each Topical Q0600  . citalopram  10 mg Oral Daily  . gabapentin  300 mg Oral TID  . heparin  5,000 Units Subcutaneous Q8H  . nicotine  14 mg Transdermal Daily   Continuous Infusions: . sodium chloride 250 mL (11/15/16 0500)  . vancomycin 1,000 mg (11/15/16 1337)     LOS: 5 days    Time spent in minutes: 35    Calvert Cantor, MD [facility] Pager: [email or web address] Password [email or web address] 11/15/2016, 2:57 PM

## 2016-11-15 NOTE — Progress Notes (Signed)
Subjective:  C/o right sided abdominal pain chest, abdominal pain that is pleuritic   Antibiotics:  Anti-infectives    Start     Dose/Rate Route Frequency Ordered Stop   11/15/16 1800  ceftaroline (TEFLARO) 600 mg in sodium chloride 0.9 % 250 mL IVPB     600 mg 250 mL/hr over 60 Minutes Intravenous Every 12 hours 11/15/16 1635     11/13/16 1215  vancomycin (VANCOCIN) IVPB 1000 mg/200 mL premix     1,000 mg 200 mL/hr over 60 Minutes Intravenous Every 8 hours 11/13/16 1203     11/11/16 1100  vancomycin (VANCOCIN) IVPB 750 mg/150 ml premix  Status:  Discontinued     750 mg 150 mL/hr over 60 Minutes Intravenous Every 8 hours 11/11/16 0956 11/13/16 1203   11/11/16 0400  vancomycin (VANCOCIN) 500 mg in sodium chloride 0.9 % 100 mL IVPB  Status:  Discontinued     500 mg 100 mL/hr over 60 Minutes Intravenous Every 12 hours 11/10/16 1951 11/11/16 0956   11/10/16 2200  ceFEPIme (MAXIPIME) 1 g in dextrose 5 % 50 mL IVPB  Status:  Discontinued     1 g 100 mL/hr over 30 Minutes Intravenous Every 12 hours 11/10/16 1951 11/11/16 0926   11/10/16 2000  ceFEPIme (MAXIPIME) 2 g in dextrose 5 % 50 mL IVPB  Status:  Discontinued     2 g 100 mL/hr over 30 Minutes Intravenous  Once 11/10/16 1952 11/10/16 1954   11/10/16 2000  vancomycin (VANCOCIN) IVPB 1000 mg/200 mL premix  Status:  Discontinued     1,000 mg 200 mL/hr over 60 Minutes Intravenous  Once 11/10/16 1952 11/10/16 1954   11/10/16 1430  piperacillin-tazobactam (ZOSYN) IVPB 3.375 g     3.375 g 100 mL/hr over 30 Minutes Intravenous  Once 11/10/16 1426 11/10/16 1503   11/10/16 1430  vancomycin (VANCOCIN) IVPB 1000 mg/200 mL premix     1,000 mg 200 mL/hr over 60 Minutes Intravenous  Once 11/10/16 1426 11/10/16 1653      Medications: Scheduled Meds: . buprenorphine-naloxone  2 tablet Sublingual Daily  . busPIRone  10 mg Oral BID  . Chlorhexidine Gluconate Cloth  6 each Topical Q0600  . citalopram  10 mg Oral Daily  .  gabapentin  300 mg Oral TID  . heparin  5,000 Units Subcutaneous Q8H  . nicotine  14 mg Transdermal Daily   Continuous Infusions: . sodium chloride 250 mL (11/15/16 0500)  . ceFTAROline (TEFLARO) IV Stopped (11/15/16 1910)  . vancomycin Stopped (11/15/16 1437)   PRN Meds:.sodium chloride, acetaminophen, albuterol, ALPRAZolam, dicyclomine, docusate, hydrOXYzine, ibuprofen, loperamide, methocarbamol, nicotine polacrilex, ondansetron (ZOFRAN) IV, ondansetron    Objective: Weight change:   Intake/Output Summary (Last 24 hours) at 11/15/16 1945 Last data filed at 11/15/16 1030  Gross per 24 hour  Intake              440 ml  Output             3750 ml  Net            -3310 ml   Blood pressure 110/74, pulse (!) 120, temperature (!) 102.9 F (39.4 C), temperature source Oral, resp. rate (!) 26, height 5\' 3"  (1.6 m), weight 143 lb 15.4 oz (65.3 kg), last menstrual period 10/27/2016, SpO2 93 %. Temp:  [99.5 F (37.5 C)-102.9 F (39.4 C)] 102.9 F (39.4 C) (05/31 1605) Pulse Rate:  [100-142] 120 (05/31 1605) Resp:  [20-48] 26 (  05/31 1605) BP: (107-122)/(59-75) 110/74 (05/31 1452) SpO2:  [93 %-100 %] 93 % (05/31 1452)  Physical Exam: General: Alert and awake, oriented x3, not in any acute distress. HEENT: anicteric sclera, pupils reactive to light and accommodation, EOMI CVS regular rate, normal r,  no murmur rubs or gallops Chest: clear to auscultation bilaterally, no wheezing, rales or rhonchi Abdomen: soft nontender, nondistended, normal bowel sounds, Extremities: no  clubbing or edema noted bilaterally Skin: track marks on leg   Neuro: nonfocal  CBC: CBC Latest Ref Rng & Units 11/15/2016 11/14/2016 11/12/2016  WBC 4.0 - 10.5 K/uL 13.0(H) 12.7(H) 11.9(H)  Hemoglobin 12.0 - 15.0 g/dL 4.0(J) 8.1(X) 9.1(Y)  Hematocrit 36.0 - 46.0 % 26.3(L) 26.3(L) 24.2(L)  Platelets 150 - 400 K/uL 132(L) 123(L) 108(L)    BMET  Recent Labs  11/14/16 0033 11/15/16 0622  NA 139 135  K 4.4  4.2  CL 114* 104  CO2 19* 23  GLUCOSE 106* 98  BUN 13 9  CREATININE 0.74 0.72  CALCIUM 7.7* 7.4*     Liver Panel  No results for input(s): PROT, ALBUMIN, AST, ALT, ALKPHOS, BILITOT, BILIDIR, IBILI in the last 72 hours.     Sedimentation Rate No results for input(s): ESRSEDRATE in the last 72 hours. C-Reactive Protein No results for input(s): CRP in the last 72 hours.  Micro Results: Recent Results (from the past 720 hour(s))  Blood Culture (routine x 2)     Status: Abnormal   Collection Time: 11/10/16  2:45 PM  Result Value Ref Range Status   Specimen Description BLOOD LEFT HAND  Final   Special Requests IN PEDIATRIC BOTTLE Blood Culture adequate volume  Final   Culture  Setup Time   Final    GRAM POSITIVE COCCI IN CLUSTERS IN PEDIATRIC BOTTLE CRITICAL VALUE NOTED.  VALUE IS CONSISTENT WITH PREVIOUSLY REPORTED AND CALLED VALUE.    Culture (A)  Final    STAPHYLOCOCCUS AUREUS SUSCEPTIBILITIES PERFORMED ON PREVIOUS CULTURE WITHIN THE LAST 5 DAYS. Performed at [facility], [address], [place], [place] [address]    Report Status 11/13/2016 FINAL  Final  Blood Culture (routine x 2)     Status: Abnormal   Collection Time: 11/10/16  2:55 PM  Result Value Ref Range Status   Specimen Description BLOOD LEFT ARM  Final   Special Requests   Final    BOTTLES DRAWN AEROBIC AND ANAEROBIC Blood Culture adequate volume   Culture  Setup Time   Final    IN BOTH AEROBIC AND ANAEROBIC BOTTLES GRAM POSITIVE COCCI IN CLUSTERS CRITICAL RESULT CALLED TO, READ BACK BY AND VERIFIED WITH: TO JGRIMSLEY(PHARMd) BY TCLEVELAND 11/11/2016 AT 6:15AM Performed at [facility], [address], [place], [place] [address]    Culture METHICILLIN RESISTANT STAPHYLOCOCCUS AUREUS (A)  Final   Report Status 11/13/2016 FINAL  Final   Organism ID, Bacteria METHICILLIN RESISTANT STAPHYLOCOCCUS AUREUS  Final      Susceptibility   Methicillin resistant staphylococcus aureus - MIC*     CIPROFLOXACIN >=8 RESISTANT Resistant     ERYTHROMYCIN >=8 RESISTANT Resistant     GENTAMICIN <=0.5 SENSITIVE Sensitive     OXACILLIN >=4 RESISTANT Resistant     TETRACYCLINE <=1 SENSITIVE Sensitive     VANCOMYCIN 1 SENSITIVE Sensitive     TRIMETH/SULFA <=10 SENSITIVE Sensitive     CLINDAMYCIN <=0.25 SENSITIVE Sensitive     RIFAMPIN <=0.5 SENSITIVE Sensitive     Inducible Clindamycin NEGATIVE Sensitive     *  METHICILLIN RESISTANT STAPHYLOCOCCUS AUREUS  Blood Culture ID Panel (Reflexed)     Status: Abnormal   Collection Time: 11/10/16  2:55 PM  Result Value Ref Range Status   Enterococcus species NOT DETECTED NOT DETECTED Final   Listeria monocytogenes NOT DETECTED NOT DETECTED Final   Staphylococcus species DETECTED (A) NOT DETECTED Final    Comment: CRITICAL RESULT CALLED TO, READ BACK BY AND VERIFIED WITH: TO JGRIMSLEY(PHARMd) BY TCLEVELAND 11/11/2016 AT 6:15AM    Staphylococcus aureus DETECTED (A) NOT DETECTED Final    Comment: Methicillin (oxacillin)-resistant Staphylococcus aureus (MRSA). MRSA is predictably resistant to beta-lactam antibiotics (except ceftaroline). Preferred therapy is vancomycin unless clinically contraindicated. Patient requires contact precautions if  hospitalized. CRITICAL RESULT CALLED TO, READ BACK BY AND VERIFIED WITH: TO JGRIMSLEY(PHARMd) BY TCLEVELAND 11/11/2016 AT 6:15AM    Methicillin resistance DETECTED (A) NOT DETECTED Final    Comment: CRITICAL RESULT CALLED TO, READ BACK BY AND VERIFIED WITH: TO JGRIMSLEY(PHARMd) BY TCLEVELAND 11/11/2016 AT 6:15AM    Streptococcus species NOT DETECTED NOT DETECTED Final   Streptococcus agalactiae NOT DETECTED NOT DETECTED Final   Streptococcus pneumoniae NOT DETECTED NOT DETECTED Final   Streptococcus pyogenes NOT DETECTED NOT DETECTED Final   Acinetobacter baumannii NOT DETECTED NOT DETECTED Final   Enterobacteriaceae species NOT DETECTED NOT DETECTED Final   Enterobacter cloacae complex NOT DETECTED NOT  DETECTED Final   Escherichia coli NOT DETECTED NOT DETECTED Final   Klebsiella oxytoca NOT DETECTED NOT DETECTED Final   Klebsiella pneumoniae NOT DETECTED NOT DETECTED Final   Proteus species NOT DETECTED NOT DETECTED Final   Serratia marcescens NOT DETECTED NOT DETECTED Final   Haemophilus influenzae NOT DETECTED NOT DETECTED Final   Neisseria meningitidis NOT DETECTED NOT DETECTED Final   Pseudomonas aeruginosa NOT DETECTED NOT DETECTED Final   Candida albicans NOT DETECTED NOT DETECTED Final   Candida glabrata NOT DETECTED NOT DETECTED Final   Candida krusei NOT DETECTED NOT DETECTED Final   Candida parapsilosis NOT DETECTED NOT DETECTED Final   Candida tropicalis NOT DETECTED NOT DETECTED Final    Comment: Performed at [facility], [address] [place], [place] [address]  Urine culture     Status: None   Collection Time: 11/10/16  9:32 PM  Result Value Ref Range Status   Specimen Description URINE, CLEAN CATCH  Final   Special Requests NONE  Final   Culture   Final    NO GROWTH Performed at [facility], [address] [place], [place] [address]    Report Status 11/12/2016 FINAL  Final  Culture, blood (x 2)     Status: Abnormal   Collection Time: 11/10/16  9:46 PM  Result Value Ref Range Status   Specimen Description BLOOD LEFT ANTECUBITAL  Final   Special Requests IN PEDIATRIC BOTTLE Blood Culture adequate volume  Final   Culture  Setup Time   Final    GRAM POSITIVE COCCI IN CLUSTERS IN PEDIATRIC BOTTLE CRITICAL VALUE NOTED.  VALUE IS CONSISTENT WITH PREVIOUSLY REPORTED AND CALLED VALUE.    Culture (A)  Final    STAPHYLOCOCCUS AUREUS SUSCEPTIBILITIES PERFORMED ON PREVIOUS CULTURE WITHIN THE LAST 5 DAYS. Performed at [facility], [address], [place], [place] [address]    Report Status 11/13/2016 FINAL  Final  Culture, blood (x 2)     Status: Abnormal   Collection Time: 11/10/16  9:46 PM  Result Value Ref Range Status   Specimen Description  BLOOD LEFT HAND  Final  Special Requests IN PEDIATRIC BOTTLE Blood Culture adequate volume  Final   Culture  Setup Time   Final    GRAM POSITIVE COCCI IN CLUSTERS IN PEDIATRIC BOTTLE CRITICAL RESULT CALLED TO, READ BACK BY AND VERIFIED WITH: D WOFFORD,PHARMD AT 1353 11/11/16 BY L BENFIELD    Culture (A)  Final    STAPHYLOCOCCUS AUREUS SUSCEPTIBILITIES PERFORMED ON PREVIOUS CULTURE WITHIN THE LAST 5 DAYS. Performed at [facility], [address] [place], [place] [address]    Report Status 11/13/2016 FINAL  Final  MRSA PCR Screening     Status: Abnormal   Collection Time: 11/10/16  9:58 PM  Result Value Ref Range Status   MRSA by PCR POSITIVE (A) NEGATIVE Final    Comment:        The GeneXpert MRSA Assay (FDA approved for NASAL specimens only), is one component of a comprehensive MRSA colonization surveillance program. It is not intended to diagnose MRSA infection nor to guide or monitor treatment for MRSA infections. RESULT CALLED TO, READ BACK BY AND VERIFIED WITH: ABBY CHAVEZ,RN [id number] @ 2357 BY J SCOTTON   Culture, blood (Routine X 2) w Reflex to ID Panel     Status: Abnormal (Preliminary result)   Collection Time: 11/11/16 10:30 AM  Result Value Ref Range Status   Specimen Description BLOOD RIGHT HAND  Final   Special Requests   Final    BOTTLES DRAWN AEROBIC AND ANAEROBIC Blood Culture adequate volume IMMUNOCOMPROMISED   Culture  Setup Time   Final    GRAM POSITIVE COCCI IN CLUSTERS IN BOTH AEROBIC AND ANAEROBIC BOTTLES CRITICAL VALUE NOTED.  VALUE IS CONSISTENT WITH PREVIOUSLY REPORTED AND CALLED VALUE. Performed at [facility], [address] [place], [place] [address]    Culture STAPHYLOCOCCUS AUREUS (A)  Final   Report Status PENDING  Incomplete  Culture, blood (Routine X 2) w Reflex to ID Panel     Status: Abnormal   Collection Time: 11/11/16 10:34 AM  Result Value Ref Range Status   Specimen Description BLOOD BLOOD RIGHT HAND  Final   Special  Requests   Final    BOTTLES DRAWN AEROBIC AND ANAEROBIC Blood Culture adequate volume   Culture  Setup Time   Final    GRAM POSITIVE COCCI IN CLUSTERS IN BOTH AEROBIC AND ANAEROBIC BOTTLES CRITICAL RESULT CALLED TO, READ BACK BY AND VERIFIED WITH: T. PICKERING PHARMD, AT 0702 11/12/16 BY D. VANHOOK    Culture (A)  Final    STAPHYLOCOCCUS AUREUS SUSCEPTIBILITIES PERFORMED ON PREVIOUS CULTURE WITHIN THE LAST 5 DAYS. Performed at [facility], [address] [place], [place] [address]    Report Status 11/14/2016 FINAL  Final  Culture, blood (Routine X 2) w Reflex to ID Panel     Status: Abnormal   Collection Time: 11/12/16  5:42 PM  Result Value Ref Range Status   Specimen Description BLOOD RIGHT ARM  Final   Special Requests IN PEDIATRIC BOTTLE Blood Culture adequate volume  Final   Culture  Setup Time   Final    GRAM POSITIVE COCCI IN CLUSTERS IN PEDIATRIC BOTTLE CRITICAL RESULT CALLED TO, READ BACK BY AND VERIFIED WITH: N. GLOGOVAC PHARMD, AT [id number] 11/13/16 BY D. VANHOOK    Culture (A)  Final    STAPHYLOCOCCUS AUREUS SUSCEPTIBILITIES PERFORMED ON PREVIOUS CULTURE WITHIN THE LAST 5 DAYS. Performed at [facility], [address] [place], [place] [address]    Report Status 11/15/2016 FINAL  Final  Culture, blood (Routine X 2) w Reflex to ID Panel     Status: Abnormal   Collection Time: 11/12/16  5:43 PM  Result Value Ref Range Status   Specimen Description BLOOD RIGHT ARM  Final   Special Requests   Final    BOTTLES DRAWN AEROBIC AND ANAEROBIC Blood Culture adequate volume   Culture  Setup Time   Final    GRAM POSITIVE COCCI IN CLUSTERS ANAEROBIC BOTTLE ONLY CRITICAL VALUE NOTED.  VALUE IS CONSISTENT WITH PREVIOUSLY REPORTED AND CALLED VALUE.    Culture (A)  Final    STAPHYLOCOCCUS AUREUS SUSCEPTIBILITIES PERFORMED ON PREVIOUS CULTURE WITHIN THE LAST 5 DAYS. Performed at [facility], [address], [place], [place] [address]    Report Status 11/15/2016  FINAL  Final  Culture, blood (Routine X 2) w Reflex to ID Panel     Status: None (Preliminary result)   Collection Time: 11/13/16 10:49 AM  Result Value Ref Range Status   Specimen Description BLOOD RIGHT ANTECUBITAL  Final   Special Requests   Final    BOTTLES DRAWN AEROBIC ONLY Blood Culture adequate volume   Culture   Final    NO GROWTH 2 DAYS Performed at [facility], [address], [place], [place] [address]    Report Status PENDING  Incomplete  Culture, blood (Routine X 2) w Reflex to ID Panel     Status: None (Preliminary result)   Collection Time: 11/13/16 10:52 AM  Result Value Ref Range Status   Specimen Description BLOOD LEFT ARM  Final   Special Requests IN PEDIATRIC BOTTLE Blood Culture adequate volume  Final   Culture  Setup Time   Final    PED GRAM POSITIVE COCCI IN CLUSTERS Organism ID to follow CRITICAL RESULT CALLED TO, READ BACK BY AND VERIFIED WITH: TO BGREEN(PHARMd) BY TCLEVELAND 11/14/2016 AT 5:05AM    Culture   Final    NO GROWTH 2 DAYS Performed at [facility], [address] [place], [place] [address]    Report Status PENDING  Incomplete  Blood Culture ID Panel (Reflexed)     Status: Abnormal   Collection Time: 11/13/16 10:52 AM  Result Value Ref Range Status   Enterococcus species NOT DETECTED NOT DETECTED Final   Listeria monocytogenes NOT DETECTED NOT DETECTED Final   Staphylococcus species DETECTED (A) NOT DETECTED Final    Comment: CRITICAL RESULT CALLED TO, READ BACK BY AND VERIFIED WITH: TO BGREEN(PHARMd) BY TCLEVELAND 11/14/16 AT 5:05AM    Staphylococcus aureus DETECTED (A) NOT DETECTED Final    Comment: Methicillin (oxacillin)-resistant Staphylococcus aureus (MRSA). MRSA is predictably resistant to beta-lactam antibiotics (except ceftaroline). Preferred therapy is vancomycin unless clinically contraindicated. Patient requires contact precautions if  hospitalized. CRITICAL RESULT CALLED TO, READ BACK BY AND VERIFIED WITH: TO  BGREEN(PHARMd) BY TCLEVELAND 11/14/16 AT 5:05AM    Methicillin resistance DETECTED (A) NOT DETECTED Final    Comment: CRITICAL RESULT CALLED TO, READ BACK BY AND VERIFIED WITH: TO BGREEN(PHARMd) BY TCLEVELAND 11/14/16 AT 5:05AM    Streptococcus species NOT DETECTED NOT DETECTED Final   Streptococcus agalactiae NOT DETECTED NOT DETECTED Final   Streptococcus pneumoniae NOT DETECTED NOT DETECTED Final   Streptococcus pyogenes NOT DETECTED NOT DETECTED Final   Acinetobacter baumannii NOT DETECTED NOT DETECTED Final   Enterobacteriaceae species NOT DETECTED NOT DETECTED Final   Enterobacter cloacae complex NOT DETECTED NOT DETECTED Final   Escherichia coli NOT DETECTED NOT DETECTED Final   Klebsiella oxytoca NOT  DETECTED NOT DETECTED Final   Klebsiella pneumoniae NOT DETECTED NOT DETECTED Final   Proteus species NOT DETECTED NOT DETECTED Final   Serratia marcescens NOT DETECTED NOT DETECTED Final   Haemophilus influenzae NOT DETECTED NOT DETECTED Final   Neisseria meningitidis NOT DETECTED NOT DETECTED Final   Pseudomonas aeruginosa NOT DETECTED NOT DETECTED Final   Candida albicans NOT DETECTED NOT DETECTED Final   Candida glabrata NOT DETECTED NOT DETECTED Final   Candida krusei NOT DETECTED NOT DETECTED Final   Candida parapsilosis NOT DETECTED NOT DETECTED Final   Candida tropicalis NOT DETECTED NOT DETECTED Final    Comment: Performed at [facility], [address] [place], [place] [address]  Culture, blood (Routine X 2) w Reflex to ID Panel     Status: None (Preliminary result)   Collection Time: 11/14/16 12:33 AM  Result Value Ref Range Status   Specimen Description BLOOD RIGHT ANTECUBITAL  Final   Special Requests   Final    BOTTLES DRAWN AEROBIC ONLY Blood Culture adequate volume   Culture   Final    NO GROWTH 1 DAY Performed at [facility], [address], [place], [place] [address]    Report Status PENDING  Incomplete  Culture, blood (Routine X 2) w Reflex to  ID Panel     Status: Abnormal (Preliminary result)   Collection Time: 11/14/16 12:33 AM  Result Value Ref Range Status   Specimen Description BLOOD LEFT HAND  Final   Special Requests IN PEDIATRIC BOTTLE Blood Culture adequate volume  Final   Culture  Setup Time   Final    GRAM POSITIVE COCCI IN CLUSTERS IN PEDIATRIC BOTTLE CRITICAL VALUE NOTED.  VALUE IS CONSISTENT WITH PREVIOUSLY REPORTED AND CALLED VALUE. Performed at [facility], [address], [place], [place] [address]    Culture STAPHYLOCOCCUS AUREUS (A)  Final   Report Status PENDING  Incomplete  Culture, blood (Routine X 2) w Reflex to ID Panel     Status: None (Preliminary result)   Collection Time: 11/14/16  4:10 PM  Result Value Ref Range Status   Specimen Description BLOOD RIGHT ARM  Final   Special Requests IN PEDIATRIC BOTTLE Blood Culture adequate volume  Final   Culture  Setup Time   Final    IN PEDIATRIC BOTTLE GRAM POSITIVE COCCI IN CLUSTERS CRITICAL RESULT CALLED TO, READ BACK BY AND VERIFIED WITH: PHARMD A PHAM [id number] 1036AM MLM Performed at [facility], [address], [place], [place] [address]    Culture GRAM POSITIVE COCCI  Final   Report Status PENDING  Incomplete  Culture, blood (Routine X 2) w Reflex to ID Panel     Status: None (Preliminary result)   Collection Time: 11/14/16  4:10 PM  Result Value Ref Range Status   Specimen Description BLOOD RIGHT ARM  Final   Special Requests   Final    BOTTLES DRAWN AEROBIC ONLY Blood Culture adequate volume   Culture   Final    NO GROWTH < 24 HOURS Performed at [facility], [address] [place], [place] [address]    Report Status PENDING  Incomplete    Studies/Results: Ct Abdomen Pelvis W Contrast  Result Date: 11/14/2016 CLINICAL DATA:  Right upper quadrant pain times 2-3 days. Polysubstance abuse. Cholecystectomy. History of endocarditis and septic emboli. EXAM: CT ABDOMEN AND PELVIS WITH CONTRAST TECHNIQUE: Multidetector CT  imaging of the abdomen and pelvis was performed using the standard protocol  following bolus administration of intravenous contrast. CONTRAST:  80mL ISOVUE-300 IOPAMIDOL (ISOVUE-300) INJECTION 61% COMPARISON:  03/12/2016 chest CT FINDINGS: Lower chest: Small right effusion with several bilateral cavitary opacities in both lower lobes concerning for septic emboli or cavitary pneumonia. More confluent airspace opacity in the left lower lobe is also noted. The visualized heart is normal in size. There is no pericardial effusion. No large central pulmonary embolus. Hepatobiliary: Normal appearance of the liver without space-occupying mass or biliary dilatation. Cholecystectomy. Pancreas: No pancreatic mass or ductal dilatation. Mild enlargement of the pancreatic head with surrounding peripancreatic fluid is noted. Correlate to exclude a pancreatitis. Spleen: The spleen is enlarged measuring 15.7 x 5.6 x 17 cm (volume = 780 cm^3) and demonstrates a developmental cleft, partially visualized on prior chest CT and therefore believed less likely to represent a laceration. Adrenals/Urinary Tract: Adrenal glands are unremarkable. Kidneys are normal, without renal calculi, solid appearing lesions, or hydronephrosis. There appear to be pair of water attenuating cysts in the lower pole the right kidney measuring up to 2 x 1.3 cm in aggregate. Bladder is unremarkable. Stomach/Bowel: Stomach is within normal limits. Appendix appears normal. No evidence of bowel wall thickening, distention, or inflammatory changes. Vascular/Lymphatic: No significant vascular findings are present. No enlarged abdominal or pelvic lymph nodes. Reproductive: Uterus and bilateral adnexa are unremarkable. Other: Moderate volume of ascites.  Anasarca. Musculoskeletal: No acute or significant osseous findings. IMPRESSION: 1. Bibasilar, multifocal cavitary lesions noted within the visualized lower lobes consistent with septic emboli or possibly cavitary  pneumonia. More confluent airspace disease in the left lower lobe without cavitation is also present. There is a small right pleural effusion. 2. Anasarca with moderate volume of ascites noted within the abdomen and pelvis. Question right heart dysfunction/failure. 3. Splenomegaly. 4. Water attenuating cysts in the lower pole the right kidney in aggregate measuring 2 x 1.3 cm. 5. Status post cholecystectomy. Electronically Signed   By: Tollie Eth M.D.   On: 11/14/2016 16:26      Assessment/Plan:  INTERVAL HISTORY:   Still bacteremic, sp CT scan   Active Problems:   Septic embolism (HCC)   Septic shock (HCC)   Encounter for central line placement   Respiratory failure (HCC)   MRSA bacteremia   Hypokalemia   Hypophosphatemia   Endocarditis   Thrombocytopenia (HCC)   RUQ pain   Right hip pain    Savannah Palmer is a 26 y.o. 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                                            [place] [facility] Staphylococcus aureus bacteremia   Staphylococcus aureus bacteremia (SAB) is associated with a high rate of complications and mortality.  Specific aspects of clinical management are critical to optimizing the outcome of patients with SAB.  Therefore, the [facility] Antimicrobial Management Team [facility]) has initiated an intervention aimed at improving the management of SAB at [facility].  To do so, Infectious Diseases physicians are providing an evidence-based consult for the management of all patients with SAB.     Yes No Comments  Perform follow-up blood cultures (even if the patient is afebrile) to ensure clearance of bacteremia [x]  []  BLOOD CULTURES PERSISTENTLY  POSITIVE EVEN SP  POST REMOVAL OF LINE, REPEATING YET AGAIN TODAY  Remove vascular catheter and obtain follow-up blood cultures after the removal of the catheter [x]   []  CENTRAL LINE IS OUT NO CENTRAL LINES X ANOTHER 5 DAYS MINIMUM from today  Perform echocardiography to evaluate for endocarditis (transthoracic ECHO is 40-50% sensitive, TEE is > 90% sensitive) [x]  []  Please keep in mind, that neither test can definitively EXCLUDE endocarditis, and that should clinical suspicion remain high for endocarditis the patient should then still be treated with an "endocarditis" duration of therapy = 6 weeks  TTE with vegetation on TV.    WOULD GET TEE AT [place] weekend   Consult electrophysiologist to evaluate implanted cardiac device (pacemaker, ICD) []  []  NA  Ensure source control []  []  Have all abscesses been drained effectively? Have deep seeded infections (septic joints or osteomyelitis) had appropriate surgical debridement?  Source is IVDU  Investigate for "metastatic" sites of infection []  []  Does the patient have ANY symptom or physical exam finding that would suggest a deeper infection (back or neck pain that may be suggestive of vertebral osteomyelitis or epidural abscess, muscle pain that could be a symptom of pyomyositis)?  Keep in mind that for deep seeded infections MRI imaging with contrast is preferred rather than other often insensitive tests such as plain x-rays, especially early in a patient's presentation.  Lungs and heart seem to be main concern right now   Consider repeat CT of the lungs next week if still not improving    Change antibiotic therapy to vancomycin and OK to add Teflaro for now [x]  []  Beta-lactam antibiotics are preferred for MSSA due to higher cure rates.   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 Dr. Butler Denmark did point out in call to me that patient has NOT yet been therapeutic with vancomycin levels. I am fine with addition of anti-MRSA beta lactam Teflaro for now  I spent greater than 35  minutes with the patient including greater than 50% of time in face to face counsel of the patient for this, significance of persistently +  Blood cultures, R sided endocarditis  IVDU, need to have treat in coordination of their care.  Dr. Orvan Falconer to take over the service tomorrow.      LOS: 5 days   Acey Lav 11/15/2016, 7:45 PM

## 2016-11-16 ENCOUNTER — Inpatient Hospital Stay (HOSPITAL_COMMUNITY): Payer: BLUE CROSS/BLUE SHIELD

## 2016-11-16 ENCOUNTER — Inpatient Hospital Stay (HOSPITAL_COMMUNITY): Payer: BLUE CROSS/BLUE SHIELD | Admitting: Certified Registered Nurse Anesthetist

## 2016-11-16 ENCOUNTER — Encounter (HOSPITAL_COMMUNITY)
Admission: EM | Disposition: A | Discharge status: Home / Self Care | Payer: Self-pay | Source: Home / Self Care | Attending: Internal Medicine

## 2016-11-16 ENCOUNTER — Encounter (HOSPITAL_COMMUNITY): Payer: Self-pay

## 2016-11-16 DIAGNOSIS — F17210 Nicotine dependence, cigarettes, uncomplicated: Secondary | ICD-10-CM

## 2016-11-16 DIAGNOSIS — F1910 Other psychoactive substance abuse, uncomplicated: Secondary | ICD-10-CM

## 2016-11-16 DIAGNOSIS — R7881 Bacteremia: Secondary | ICD-10-CM

## 2016-11-16 DIAGNOSIS — Z6825 Body mass index (BMI) 25.0-25.9, adult: Secondary | ICD-10-CM

## 2016-11-16 HISTORY — PX: TEE WITHOUT CARDIOVERSION: SHX5443

## 2016-11-16 LAB — BODY FLUID CELL COUNT WITH DIFFERENTIAL
Eos, Fluid: 2 %
Lymphs, Fluid: 27 %
Monocyte-Macrophage-Serous Fluid: 3 % — ABNORMAL LOW (ref 50–90)
Neutrophil Count, Fluid: 68 % — ABNORMAL HIGH (ref 0–25)
WBC FLUID: UNDETERMINED uL (ref 0–1000)

## 2016-11-16 LAB — CULTURE, BLOOD (ROUTINE X 2)
Special Requests: ADEQUATE
Special Requests: ADEQUATE

## 2016-11-16 LAB — BASIC METABOLIC PANEL
Anion gap: 7 (ref 5–15)
BUN: 11 mg/dL (ref 6–20)
CO2: 26 mmol/L (ref 22–32)
Calcium: 7.6 mg/dL — ABNORMAL LOW (ref 8.9–10.3)
Chloride: 106 mmol/L (ref 101–111)
Creatinine, Ser: 0.77 mg/dL (ref 0.44–1.00)
Glucose, Bld: 94 mg/dL (ref 65–99)
POTASSIUM: 3.9 mmol/L (ref 3.5–5.1)
SODIUM: 139 mmol/L (ref 135–145)

## 2016-11-16 LAB — CBC
HEMATOCRIT: 27.9 % — AB (ref 36.0–46.0)
Hemoglobin: 9.3 g/dL — ABNORMAL LOW (ref 12.0–15.0)
MCH: 28.1 pg (ref 26.0–34.0)
MCHC: 33.3 g/dL (ref 30.0–36.0)
MCV: 84.3 fL (ref 78.0–100.0)
Platelets: 160 10*3/uL (ref 150–400)
RBC: 3.31 MIL/uL — ABNORMAL LOW (ref 3.87–5.11)
RDW: 15.3 % (ref 11.5–15.5)
WBC: 11.4 10*3/uL — AB (ref 4.0–10.5)

## 2016-11-16 LAB — PROTEIN, TOTAL: Total Protein: 5.8 g/dL — ABNORMAL LOW (ref 6.5–8.1)

## 2016-11-16 LAB — MRSA PCR SCREENING: MRSA BY PCR: NEGATIVE

## 2016-11-16 LAB — RPR: RPR: NONREACTIVE

## 2016-11-16 LAB — LACTATE DEHYDROGENASE: LDH: 174 U/L (ref 98–192)

## 2016-11-16 SURGERY — ECHOCARDIOGRAM, TRANSESOPHAGEAL
Anesthesia: Monitor Anesthesia Care

## 2016-11-16 MED ORDER — LIDOCAINE HCL (PF) 1 % IJ SOLN
INTRAMUSCULAR | Status: AC
  Administered 2016-11-16: 10 mL
  Filled 2016-11-16: qty 5

## 2016-11-16 MED ORDER — SODIUM CHLORIDE 0.9 % IV SOLN: INTRAVENOUS | Status: DC

## 2016-11-16 MED ORDER — MIDAZOLAM HCL 2 MG/2ML IJ SOLN
INTRAMUSCULAR | Status: AC
  Administered 2016-11-16: 1 mg
  Filled 2016-11-16: qty 2

## 2016-11-16 MED ORDER — FENTANYL CITRATE (PF) 100 MCG/2ML IJ SOLN
50.0000 ug | Freq: Once | INTRAMUSCULAR | Status: AC
  Administered 2016-11-16: 50 ug via INTRAVENOUS
  Filled 2016-11-16: qty 2

## 2016-11-16 MED ORDER — LIDOCAINE HCL (CARDIAC) 20 MG/ML IV SOLN
INTRAVENOUS | Status: DC | PRN
  Administered 2016-11-16: 20 mg via INTRAVENOUS

## 2016-11-16 MED ORDER — LIDOCAINE HCL 1 % IJ SOLN
INTRAMUSCULAR | Status: AC
  Filled 2016-11-16: qty 20

## 2016-11-16 MED ORDER — PROPOFOL 10 MG/ML IV BOLUS
INTRAVENOUS | Status: DC | PRN
  Administered 2016-11-16: 60 mg via INTRAVENOUS

## 2016-11-16 MED ORDER — OXYCODONE HCL 5 MG PO TABS
10.0000 mg | ORAL_TABLET | Freq: Once | ORAL | Status: AC
  Administered 2016-11-16: 10 mg via ORAL
  Filled 2016-11-16: qty 2

## 2016-11-16 MED ORDER — ACETAMINOPHEN 500 MG PO TABS
1000.0000 mg | ORAL_TABLET | Freq: Four times a day (QID) | ORAL | Status: DC
  Administered 2016-11-16 – 2016-12-03 (×58): 1000 mg via ORAL
  Filled 2016-11-16 (×66): qty 2

## 2016-11-16 MED ORDER — LACTATED RINGERS IV SOLN
INTRAVENOUS | Status: DC | PRN
  Administered 2016-11-16: 14:00:00 via INTRAVENOUS

## 2016-11-16 MED ORDER — IBUPROFEN 200 MG PO TABS
600.0000 mg | ORAL_TABLET | Freq: Four times a day (QID) | ORAL | Status: DC
  Administered 2016-11-16 – 2016-11-18 (×8): 600 mg via ORAL
  Filled 2016-11-16 (×8): qty 3

## 2016-11-16 MED ORDER — OXYCODONE HCL 5 MG PO TABS
5.0000 mg | ORAL_TABLET | ORAL | Status: DC | PRN
  Administered 2016-11-18 – 2016-11-20 (×9): 10 mg via ORAL
  Filled 2016-11-16 (×4): qty 2
  Filled 2016-11-16: qty 1
  Filled 2016-11-16 (×6): qty 2

## 2016-11-16 MED ORDER — PROPOFOL 500 MG/50ML IV EMUL
INTRAVENOUS | Status: DC | PRN
  Administered 2016-11-16: 130 ug/kg/min via INTRAVENOUS

## 2016-11-16 NOTE — Progress Notes (Signed)
    [facility] has been requested to perform a transesophageal echocardiogram on Savannah Palmer for bacteremia.  After careful review of history and examination, the risks and benefits of transesophageal echocardiogram have been explained including risks of esophageal damage, perforation (1:10,000 risk), bleeding, pharyngeal hematoma as well as other potential complications associated with conscious sedation including aspiration, arrhythmia, respiratory failure and death. Alternatives to treatment were discussed, questions were answered. Patient is willing to proceed.   [facility] with Dr. Royann Shiversroitoru. Please arrange [facility].  Roe Rutherfordngela Nicole Tessah Patchen, [place]  11/16/2016 7:25 AM

## 2016-11-16 NOTE — Transfer of Care (Signed)
Immediate Anesthesia Transfer of Care Note  Patient: Savannah Palmer  Procedure(s) Performed: Procedure(s): TRANSESOPHAGEAL ECHOCARDIOGRAM (TEE) (N/A)  Patient Location: PACU and Endoscopy Unit  Anesthesia Type:MAC  Level of Consciousness: awake, alert , oriented and patient cooperative  Airway & Oxygen Therapy: Patient Spontanous Breathing and Patient connected to nasal cannula oxygen  Post-op Assessment: Report given to RN and Post -op Vital signs reviewed and stable  Post vital signs: Reviewed and stable  Last Vitals:  Vitals:   11/16/16 1305 11/16/16 1458  BP: (!) 145/97 131/88  Pulse: (!) 118 (!) 121  Resp: 20 (!) 39  Temp: 37.2 C 37.2 C    Last Pain:  Vitals:   11/16/16 1458  TempSrc: Oral  PainSc:       Patients Stated Pain Goal: 2 (97/35/32 9924)  Complications: No apparent anesthesia complications

## 2016-11-16 NOTE — Progress Notes (Signed)
PROGRESS NOTE    Savannah Palmer   ZOX:[id number]  DOB: 02-19-1991  DOA: 11/10/2016 PCP: Patient, No Pcp Per   Brief Narrative:  26 y/o female with anxiety, depression and heroin abuse who presents with weakness, cough and chest pain. Found to be in septic shock and required pressors. Cultures have grown MRSA. She continues to be febrile with positive blood cultures and central line has been removed. Cultures are being repeated.   Subjective: Feels like she is getting more short of breath with activity. She continues to have RUQ abdominal discomfort.   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 - HIV negative, hepatitis panel negative - ID assisting with management - no central line for now - TEE today  RUQ pain - obtained CT scan- no noted abnormalities in liver or gallbladder- has ascites-   Ascites - likely due to TR- limit IVF and follow    Hypokalemia   Hypophosphatemia   Hypomagnesemia - replete PRN     Thrombocytopenia   - mild - has resolved  Heroin abuse - cont Suboxone  DVT prophylaxis: Heparin Code Status: Full code Family Communication: mother Disposition Plan: follow in hospital  Consultants:   ID Procedures:     Antimicrobials:  Anti-infectives    Start     Dose/Rate Route Frequency Ordered Stop   11/15/16 1800  [MAR Hold]  ceftaroline (TEFLARO) 600 mg in sodium chloride 0.9 % 250 mL IVPB     (MAR Hold since 11/16/16 1304)   600 mg 250 mL/hr over 60 Minutes Intravenous Every 12 hours 11/15/16 1635     11/13/16 1215  vancomycin (VANCOCIN) IVPB 1000 mg/200 mL premix  Status:   Discontinued     1,000 mg 200 mL/hr over 60 Minutes Intravenous Every 8 hours 11/13/16 1203 11/16/16 0850   11/11/16 1100  vancomycin (VANCOCIN) IVPB 750 mg/150 ml premix  Status:  Discontinued     750 mg 150 mL/hr over 60 Minutes Intravenous Every 8 hours 11/11/16 0956 11/13/16 1203   11/11/16 0400  vancomycin (VANCOCIN) 500 mg in sodium chloride 0.9 % 100 mL IVPB  Status:  Discontinued     500 mg 100 mL/hr over 60 Minutes Intravenous Every 12 hours 11/10/16 1951 11/11/16 0956   11/10/16 2200  ceFEPIme (MAXIPIME) 1 g in dextrose 5 % 50 mL IVPB  Status:  Discontinued     1 g 100 mL/hr over 30 Minutes Intravenous Every 12 hours 11/10/16 1951 11/11/16 0926   11/10/16 2000  ceFEPIme (MAXIPIME) 2 g in dextrose 5 % 50 mL IVPB  Status:  Discontinued     2 g 100 mL/hr over 30 Minutes Intravenous  Once 11/10/16 1952 11/10/16 1954   11/10/16 2000  vancomycin (VANCOCIN) IVPB 1000 mg/200 mL premix  Status:  Discontinued     1,000 mg 200 mL/hr over 60 Minutes Intravenous  Once 11/10/16 1952 11/10/16 1954   11/10/16 1430  piperacillin-tazobactam (ZOSYN) IVPB 3.375 g     3.375 g 100 mL/hr over 30 Minutes Intravenous  Once 11/10/16 1426 11/10/16 1503   11/10/16 1430  vancomycin (VANCOCIN) IVPB 1000 mg/200 mL  premix     1,000 mg 200 mL/hr over 60 Minutes Intravenous  Once 11/10/16 1426 11/10/16 1653       Objective: Vitals:   11/16/16 0925 11/16/16 1030 11/16/16 1035 11/16/16 1305  BP: 128/78   (!) 145/97  Pulse: (!) 129   (!) 118  Resp: (!) 22 (!) 22 20 20   Temp: 98.6 F (37 C)   99 F (37.2 C)  TempSrc: Oral   Oral  SpO2: 92% (!) 87% 96% 96%  Weight:      Height:        Intake/Output Summary (Last 24 hours) at 11/16/16 1432 Last data filed at 11/16/16 1207  Gross per 24 hour  Intake                0 ml  Output                0 ml  Net                0 ml   Filed Weights   11/12/16 0410 11/13/16 0950 11/14/16 0500  Weight: 60 kg (132 lb 4.4 oz) 65.5 kg (144 lb 6.4 oz) 65.3 kg  (143 lb 15.4 oz)    Examination: General exam: Appears comfortable  HEENT: PERRLA, oral mucosa moist, no sclera icterus or thrush Respiratory system: decreased breath sounds in RLL- Respiratory effort normal. Cardiovascular system: S1 & S2 heard, RRR.  No murmurs  Gastrointestinal system: Abdomen soft, less tender in RUQ, nondistended. Normal bowel sound. No organomegaly Central nervous system: Alert and oriented. No focal neurological deficits. Extremities: No cyanosis, clubbing - left arm is more swollen than right arm (around IV site) Skin: No rashes or ulcers Psychiatry:  Mood & affect appropriate.     Data Reviewed: I have personally reviewed following labs and imaging studies  CBC:  Recent Labs Lab 11/10/16 1342  11/11/16 0306 11/12/16 0352 11/14/16 1110 11/15/16 0622 11/16/16 0548  WBC 14.5*  < > 10.5 11.9* 12.7* 13.0* 11.4*  NEUTROABS 12.3*  --   --   --   --   --   --   HGB 11.2*  < > 9.0* 8.4* 8.8* 8.8* 9.3*  HCT 32.5*  < > 26.4* 24.2* 26.3* 26.3* 27.9*  MCV 82.7  < > 82.5 82.6 85.4 84.0 84.3  PLT 119*  < > 91* 108* 123* 132* 160  < > = values in this interval not displayed. Basic Metabolic Panel:  Recent Labs Lab 11/10/16 1350  11/11/16 0306 11/12/16 0352 11/13/16 0335 11/14/16 0033 11/15/16 0622 11/16/16 0548  NA  --   < > 138 142  --  139 135 139  K  --   < > 3.3* 3.3*  --  4.4 4.2 3.9  CL  --   < > 108 113*  --  114* 104 106  CO2  --   < > 23 24  --  19* 23 26  GLUCOSE  --   < > 175* 147*  --  106* 98 94  BUN  --   < > 24* 12  --  13 9 11   CREATININE  --   < > 0.68 0.59 0.61 0.74 0.72 0.77  CALCIUM  --   < > 7.6* 8.2*  --  7.7* 7.4* 7.6*  MG 1.5*  --  2.6* 1.7 1.6*  --   --   --   PHOS  --   --  1.9* 1.5*  --   --   --   --   < > =  values in this interval not displayed. GFR: Estimated Creatinine Clearance: 96.9 mL/min (by C-G formula based on SCr of 0.77 mg/dL). Liver Function Tests:  Recent Labs Lab 11/10/16 1342 11/10/16 2119  AST 38 25   ALT 20 16  ALKPHOS 249* 164*  BILITOT 1.0 0.9  PROT 6.6 5.0*  ALBUMIN 2.8* 2.1*   No results for input(s): LIPASE, AMYLASE in the last 168 hours. No results for input(s): AMMONIA in the last 168 hours. Coagulation Profile:  Recent Labs Lab 11/10/16 2119  INR 1.16   Cardiac Enzymes:  Recent Labs Lab 11/10/16 2119  TROPONINI 0.05*   BNP (last 3 results) No results for input(s): PROBNP in the last 8760 hours. HbA1C: No results for input(s): HGBA1C in the last 72 hours. CBG:  Recent Labs Lab 11/14/16 1151 11/14/16 1639 11/14/16 1947 11/15/16 0002 11/15/16 0748  GLUCAP 120* 84 92 109* 85   Lipid Profile: No results for input(s): CHOL, HDL, LDLCALC, TRIG, CHOLHDL, LDLDIRECT in the last 72 hours. Thyroid Function Tests: No results for input(s): TSH, T4TOTAL, FREET4, T3FREE, THYROIDAB in the last 72 hours. Anemia Panel: No results for input(s): VITAMINB12, FOLATE, FERRITIN, TIBC, IRON, RETICCTPCT in the last 72 hours. Urine analysis:    Component Value Date/Time   COLORURINE YELLOW 11/10/2016 2132   APPEARANCEUR CLEAR 11/10/2016 2132   LABSPEC 1.006 11/10/2016 2132   PHURINE 5.0 11/10/2016 2132   GLUCOSEU NEGATIVE 11/10/2016 2132   HGBUR MODERATE (A) 11/10/2016 2132   BILIRUBINUR NEGATIVE 11/10/2016 2132   KETONESUR NEGATIVE 11/10/2016 2132   PROTEINUR NEGATIVE 11/10/2016 2132   UROBILINOGEN 0.2 08/05/2010 1821   NITRITE NEGATIVE 11/10/2016 2132   LEUKOCYTESUR SMALL (A) 11/10/2016 2132   Sepsis Labs: @LABRCNTIP (procalcitonin:4,lacticidven:4) ) Recent Results (from the past 240 hour(s))  Blood Culture (routine x 2)     Status: Abnormal   Collection Time: 11/10/16  2:45 PM  Result Value Ref Range Status   Specimen Description BLOOD LEFT HAND  Final   Special Requests IN PEDIATRIC BOTTLE Blood Culture adequate volume  Final   Culture  Setup Time   Final    GRAM POSITIVE COCCI IN CLUSTERS IN PEDIATRIC BOTTLE CRITICAL VALUE NOTED.  VALUE IS CONSISTENT WITH  PREVIOUSLY REPORTED AND CALLED VALUE.    Culture (A)  Final    STAPHYLOCOCCUS AUREUS SUSCEPTIBILITIES PERFORMED ON PREVIOUS CULTURE WITHIN THE LAST 5 DAYS. Performed at [facility], [address] [place], [place] [address]    Report Status 11/13/2016 FINAL  Final  Blood Culture (routine x 2)     Status: Abnormal   Collection Time: 11/10/16  2:55 PM  Result Value Ref Range Status   Specimen Description BLOOD LEFT ARM  Final   Special Requests   Final    BOTTLES DRAWN AEROBIC AND ANAEROBIC Blood Culture adequate volume   Culture  Setup Time   Final    IN BOTH AEROBIC AND ANAEROBIC BOTTLES GRAM POSITIVE COCCI IN CLUSTERS CRITICAL RESULT CALLED TO, READ BACK BY AND VERIFIED WITH: TO JGRIMSLEY(PHARMd) BY TCLEVELAND 11/11/2016 AT 6:15AM Performed at [facility], [address] [place], [place] [address]    Culture METHICILLIN RESISTANT STAPHYLOCOCCUS AUREUS (A)  Final   Report Status 11/13/2016 FINAL  Final   Organism ID, Bacteria METHICILLIN RESISTANT STAPHYLOCOCCUS AUREUS  Final      Susceptibility   Methicillin resistant staphylococcus aureus - MIC*    CIPROFLOXACIN >=8 RESISTANT Resistant     ERYTHROMYCIN >=8 RESISTANT Resistant     GENTAMICIN <=0.5 SENSITIVE  Sensitive     OXACILLIN >=4 RESISTANT Resistant     TETRACYCLINE <=1 SENSITIVE Sensitive     VANCOMYCIN 1 SENSITIVE Sensitive     TRIMETH/SULFA <=10 SENSITIVE Sensitive     CLINDAMYCIN <=0.25 SENSITIVE Sensitive     RIFAMPIN <=0.5 SENSITIVE Sensitive     Inducible Clindamycin NEGATIVE Sensitive     * METHICILLIN RESISTANT STAPHYLOCOCCUS AUREUS  Blood Culture ID Panel (Reflexed)     Status: Abnormal   Collection Time: 11/10/16  2:55 PM  Result Value Ref Range Status   Enterococcus species NOT DETECTED NOT DETECTED Final   Listeria monocytogenes NOT DETECTED NOT DETECTED Final   Staphylococcus species DETECTED (A) NOT DETECTED Final    Comment: CRITICAL RESULT CALLED TO, READ BACK BY AND VERIFIED WITH: TO  JGRIMSLEY(PHARMd) BY TCLEVELAND 11/11/2016 AT 6:15AM    Staphylococcus aureus DETECTED (A) NOT DETECTED Final    Comment: Methicillin (oxacillin)-resistant Staphylococcus aureus (MRSA). MRSA is predictably resistant to beta-lactam antibiotics (except ceftaroline). Preferred therapy is vancomycin unless clinically contraindicated. Patient requires contact precautions if  hospitalized. CRITICAL RESULT CALLED TO, READ BACK BY AND VERIFIED WITH: TO JGRIMSLEY(PHARMd) BY TCLEVELAND 11/11/2016 AT 6:15AM    Methicillin resistance DETECTED (A) NOT DETECTED Final    Comment: CRITICAL RESULT CALLED TO, READ BACK BY AND VERIFIED WITH: TO JGRIMSLEY(PHARMd) BY TCLEVELAND 11/11/2016 AT 6:15AM    Streptococcus species NOT DETECTED NOT DETECTED Final   Streptococcus agalactiae NOT DETECTED NOT DETECTED Final   Streptococcus pneumoniae NOT DETECTED NOT DETECTED Final   Streptococcus pyogenes NOT DETECTED NOT DETECTED Final   Acinetobacter baumannii NOT DETECTED NOT DETECTED Final   Enterobacteriaceae species NOT DETECTED NOT DETECTED Final   Enterobacter cloacae complex NOT DETECTED NOT DETECTED Final   Escherichia coli NOT DETECTED NOT DETECTED Final   Klebsiella oxytoca NOT DETECTED NOT DETECTED Final   Klebsiella pneumoniae NOT DETECTED NOT DETECTED Final   Proteus species NOT DETECTED NOT DETECTED Final   Serratia marcescens NOT DETECTED NOT DETECTED Final   Haemophilus influenzae NOT DETECTED NOT DETECTED Final   Neisseria meningitidis NOT DETECTED NOT DETECTED Final   Pseudomonas aeruginosa NOT DETECTED NOT DETECTED Final   Candida albicans NOT DETECTED NOT DETECTED Final   Candida glabrata NOT DETECTED NOT DETECTED Final   Candida krusei NOT DETECTED NOT DETECTED Final   Candida parapsilosis NOT DETECTED NOT DETECTED Final   Candida tropicalis NOT DETECTED NOT DETECTED Final    Comment: Performed at [facility], [address], [place], [place] [address]  Urine culture     Status: None    Collection Time: 11/10/16  9:32 PM  Result Value Ref Range Status   Specimen Description URINE, CLEAN CATCH  Final   Special Requests NONE  Final   Culture   Final    NO GROWTH Performed at [facility], [address] [place], [place] [address]    Report Status 11/12/2016 FINAL  Final  Culture, blood (x 2)     Status: Abnormal   Collection Time: 11/10/16  9:46 PM  Result Value Ref Range Status   Specimen Description BLOOD LEFT ANTECUBITAL  Final   Special Requests IN PEDIATRIC BOTTLE Blood Culture adequate volume  Final   Culture  Setup Time   Final    GRAM POSITIVE COCCI IN CLUSTERS IN PEDIATRIC BOTTLE CRITICAL VALUE NOTED.  VALUE IS CONSISTENT WITH PREVIOUSLY REPORTED AND CALLED VALUE.    Culture (A)  Final    STAPHYLOCOCCUS AUREUS SUSCEPTIBILITIES PERFORMED ON PREVIOUS CULTURE  WITHIN THE LAST 5 DAYS. Performed at [facility], [address] [place], [place] [address]    Report Status 11/13/2016 FINAL  Final  Culture, blood (x 2)     Status: Abnormal   Collection Time: 11/10/16  9:46 PM  Result Value Ref Range Status   Specimen Description BLOOD LEFT HAND  Final   Special Requests IN PEDIATRIC BOTTLE Blood Culture adequate volume  Final   Culture  Setup Time   Final    GRAM POSITIVE COCCI IN CLUSTERS IN PEDIATRIC BOTTLE CRITICAL RESULT CALLED TO, READ BACK BY AND VERIFIED WITH: D WOFFORD,PHARMD AT 1353 11/11/16 BY L BENFIELD    Culture (A)  Final    STAPHYLOCOCCUS AUREUS SUSCEPTIBILITIES PERFORMED ON PREVIOUS CULTURE WITHIN THE LAST 5 DAYS. Performed at [facility], [address] [place], [place] [address]    Report Status 11/13/2016 FINAL  Final  MRSA PCR Screening     Status: Abnormal   Collection Time: 11/10/16  9:58 PM  Result Value Ref Range Status   MRSA by PCR POSITIVE (A) NEGATIVE Final    Comment:        The GeneXpert MRSA Assay (FDA approved for NASAL specimens only), is one component of a comprehensive MRSA  colonization surveillance program. It is not intended to diagnose MRSA infection nor to guide or monitor treatment for MRSA infections. RESULT CALLED TO, READ BACK BY AND VERIFIED WITH: ABBY CHAVEZ,RN [id number] @ 2357 BY J SCOTTON   Culture, blood (Routine X 2) w Reflex to ID Panel     Status: Abnormal   Collection Time: 11/11/16 10:30 AM  Result Value Ref Range Status   Specimen Description BLOOD RIGHT HAND  Final   Special Requests   Final    BOTTLES DRAWN AEROBIC AND ANAEROBIC Blood Culture adequate volume IMMUNOCOMPROMISED   Culture  Setup Time   Final    GRAM POSITIVE COCCI IN CLUSTERS IN BOTH AEROBIC AND ANAEROBIC BOTTLES CRITICAL VALUE NOTED.  VALUE IS CONSISTENT WITH PREVIOUSLY REPORTED AND CALLED VALUE.    Culture (A)  Final    STAPHYLOCOCCUS AUREUS SUSCEPTIBILITIES PERFORMED ON PREVIOUS CULTURE WITHIN THE LAST 5 DAYS. Performed at [facility], [address] [place], [place] [address]    Report Status 11/16/2016 FINAL  Final  Culture, blood (Routine X 2) w Reflex to ID Panel     Status: Abnormal   Collection Time: 11/11/16 10:34 AM  Result Value Ref Range Status   Specimen Description BLOOD BLOOD RIGHT HAND  Final   Special Requests   Final    BOTTLES DRAWN AEROBIC AND ANAEROBIC Blood Culture adequate volume   Culture  Setup Time   Final    GRAM POSITIVE COCCI IN CLUSTERS IN BOTH AEROBIC AND ANAEROBIC BOTTLES CRITICAL RESULT CALLED TO, READ BACK BY AND VERIFIED WITH: T. PICKERING PHARMD, AT 0702 11/12/16 BY D. VANHOOK    Culture (A)  Final    STAPHYLOCOCCUS AUREUS SUSCEPTIBILITIES PERFORMED ON PREVIOUS CULTURE WITHIN THE LAST 5 DAYS. Performed at [facility], [address] [place], [place] [address]    Report Status 11/14/2016 FINAL  Final  Culture, blood (Routine X 2) w Reflex to ID Panel     Status: Abnormal   Collection Time: 11/12/16  5:42 PM  Result Value Ref Range Status   Specimen Description BLOOD RIGHT ARM  Final   Special Requests IN  PEDIATRIC BOTTLE Blood Culture adequate volume  Final   Culture  Setup Time  Final    GRAM POSITIVE COCCI IN CLUSTERS IN PEDIATRIC BOTTLE CRITICAL RESULT CALLED TO, READ BACK BY AND VERIFIED WITH: N. GLOGOVAC PHARMD, AT 1610 11/13/16 BY D. VANHOOK    Culture (A)  Final    STAPHYLOCOCCUS AUREUS SUSCEPTIBILITIES PERFORMED ON PREVIOUS CULTURE WITHIN THE LAST 5 DAYS. Performed at [facility], [address] [place], [place] [address]    Report Status 11/15/2016 FINAL  Final  Culture, blood (Routine X 2) w Reflex to ID Panel     Status: Abnormal   Collection Time: 11/12/16  5:43 PM  Result Value Ref Range Status   Specimen Description BLOOD RIGHT ARM  Final   Special Requests   Final    BOTTLES DRAWN AEROBIC AND ANAEROBIC Blood Culture adequate volume   Culture  Setup Time   Final    GRAM POSITIVE COCCI IN CLUSTERS ANAEROBIC BOTTLE ONLY CRITICAL VALUE NOTED.  VALUE IS CONSISTENT WITH PREVIOUSLY REPORTED AND CALLED VALUE.    Culture (A)  Final    STAPHYLOCOCCUS AUREUS SUSCEPTIBILITIES PERFORMED ON PREVIOUS CULTURE WITHIN THE LAST 5 DAYS. Performed at [facility], [address] [place], [place] [address]    Report Status 11/15/2016 FINAL  Final  Culture, blood (Routine X 2) w Reflex to ID Panel     Status: None (Preliminary result)   Collection Time: 11/13/16 10:49 AM  Result Value Ref Range Status   Specimen Description BLOOD RIGHT ANTECUBITAL  Final   Special Requests   Final    BOTTLES DRAWN AEROBIC ONLY Blood Culture adequate volume   Culture   Final    NO GROWTH 3 DAYS Performed at [facility], [address] [place], [place] [address]    Report Status PENDING  Incomplete  Culture, blood (Routine X 2) w Reflex to ID Panel     Status: Abnormal   Collection Time: 11/13/16 10:52 AM  Result Value Ref Range Status   Specimen Description BLOOD LEFT ARM  Final   Special Requests IN PEDIATRIC BOTTLE Blood Culture adequate volume  Final   Culture  Setup Time    Final    IN PEDIATRIC BOTTLE GRAM POSITIVE COCCI IN CLUSTERS Organism ID to follow CRITICAL RESULT CALLED TO, READ BACK BY AND VERIFIED WITH: TO BGREEN(PHARMd) BY TCLEVELAND 11/14/2016 AT 5:05AM    Culture (A)  Final    STAPHYLOCOCCUS AUREUS SUSCEPTIBILITIES PERFORMED ON PREVIOUS CULTURE WITHIN THE LAST 5 DAYS. Performed at [facility], [address] [place], [place] [address]    Report Status 11/16/2016 FINAL  Final  Blood Culture ID Panel (Reflexed)     Status: Abnormal   Collection Time: 11/13/16 10:52 AM  Result Value Ref Range Status   Enterococcus species NOT DETECTED NOT DETECTED Final   Listeria monocytogenes NOT DETECTED NOT DETECTED Final   Staphylococcus species DETECTED (A) NOT DETECTED Final    Comment: CRITICAL RESULT CALLED TO, READ BACK BY AND VERIFIED WITH: TO BGREEN(PHARMd) BY TCLEVELAND 11/14/16 AT 5:05AM    Staphylococcus aureus DETECTED (A) NOT DETECTED Final    Comment: Methicillin (oxacillin)-resistant Staphylococcus aureus (MRSA). MRSA is predictably resistant to beta-lactam antibiotics (except ceftaroline). Preferred therapy is vancomycin unless clinically contraindicated. Patient requires contact precautions if  hospitalized. CRITICAL RESULT CALLED TO, READ BACK BY AND VERIFIED WITH: TO BGREEN(PHARMd) BY TCLEVELAND 11/14/16 AT 5:05AM    Methicillin resistance DETECTED (A) NOT DETECTED Final    Comment: CRITICAL RESULT CALLED TO, READ BACK BY AND VERIFIED WITH: TO BGREEN(PHARMd)  BY [facility] 11/14/16 AT 5:05AM    Streptococcus species NOT DETECTED NOT DETECTED Final   Streptococcus agalactiae NOT DETECTED NOT DETECTED Final   Streptococcus pneumoniae NOT DETECTED NOT DETECTED Final   Streptococcus pyogenes NOT DETECTED NOT DETECTED Final   Acinetobacter baumannii NOT DETECTED NOT DETECTED Final   Enterobacteriaceae species NOT DETECTED NOT DETECTED Final   Enterobacter cloacae complex NOT DETECTED NOT DETECTED Final   Escherichia coli NOT DETECTED  NOT DETECTED Final   Klebsiella oxytoca NOT DETECTED NOT DETECTED Final   Klebsiella pneumoniae NOT DETECTED NOT DETECTED Final   Proteus species NOT DETECTED NOT DETECTED Final   Serratia marcescens NOT DETECTED NOT DETECTED Final   Haemophilus influenzae NOT DETECTED NOT DETECTED Final   Neisseria meningitidis NOT DETECTED NOT DETECTED Final   Pseudomonas aeruginosa NOT DETECTED NOT DETECTED Final   Candida albicans NOT DETECTED NOT DETECTED Final   Candida glabrata NOT DETECTED NOT DETECTED Final   Candida krusei NOT DETECTED NOT DETECTED Final   Candida parapsilosis NOT DETECTED NOT DETECTED Final   Candida tropicalis NOT DETECTED NOT DETECTED Final    Comment: Performed at [facility], [address] [place], [place] [address]  Culture, blood (Routine X 2) w Reflex to ID Panel     Status: None (Preliminary result)   Collection Time: 11/14/16 12:33 AM  Result Value Ref Range Status   Specimen Description BLOOD RIGHT ANTECUBITAL  Final   Special Requests   Final    BOTTLES DRAWN AEROBIC ONLY Blood Culture adequate volume   Culture   Final    NO GROWTH 2 DAYS Performed at [facility], [address], [place], [place] [address]    Report Status PENDING  Incomplete  Culture, blood (Routine X 2) w Reflex to ID Panel     Status: Abnormal   Collection Time: 11/14/16 12:33 AM  Result Value Ref Range Status   Specimen Description BLOOD LEFT HAND  Final   Special Requests IN PEDIATRIC BOTTLE Blood Culture adequate volume  Final   Culture  Setup Time   Final    GRAM POSITIVE COCCI IN CLUSTERS IN PEDIATRIC BOTTLE CRITICAL VALUE NOTED.  VALUE IS CONSISTENT WITH PREVIOUSLY REPORTED AND CALLED VALUE.    Culture (A)  Final    STAPHYLOCOCCUS AUREUS SUSCEPTIBILITIES PERFORMED ON PREVIOUS CULTURE WITHIN THE LAST 5 DAYS. Performed at [facility], [address], [place], [place] [address]    Report Status 11/16/2016 FINAL  Final  Culture, blood (Routine X 2) w Reflex to  ID Panel     Status: Abnormal (Preliminary result)   Collection Time: 11/14/16  4:10 PM  Result Value Ref Range Status   Specimen Description BLOOD RIGHT ARM  Final   Special Requests IN PEDIATRIC BOTTLE Blood Culture adequate volume  Final   Culture  Setup Time   Final    IN PEDIATRIC BOTTLE GRAM POSITIVE COCCI IN CLUSTERS CRITICAL RESULT CALLED TO, READ BACK BY AND VERIFIED WITH: PHARMD A PHAM [id number] MLM    Culture (A)  Final    STAPHYLOCOCCUS AUREUS SUSCEPTIBILITIES PERFORMED ON PREVIOUS CULTURE WITHIN THE LAST 5 DAYS. Performed at [facility], [address], [place], [place] [address]    Report Status PENDING  Incomplete  Culture, blood (Routine X 2) w Reflex to ID Panel     Status: None (Preliminary result)   Collection Time: 11/14/16  4:10 PM  Result Value Ref Range Status   Specimen Description BLOOD  RIGHT ARM  Final   Special Requests   Final    BOTTLES DRAWN AEROBIC ONLY Blood Culture adequate volume   Culture   Final    NO GROWTH 2 DAYS Performed at [facility], [address], [place], [place] [address]    Report Status PENDING  Incomplete         Radiology Studies: Ct Abdomen Pelvis W Contrast  Result Date: 11/14/2016 CLINICAL DATA:  Right upper quadrant pain times 2-3 days. Polysubstance abuse. Cholecystectomy. History of endocarditis and septic emboli. EXAM: CT ABDOMEN AND PELVIS WITH CONTRAST TECHNIQUE: Multidetector CT imaging of the abdomen and pelvis was performed using the standard protocol following bolus administration of intravenous contrast. CONTRAST:  80mL ISOVUE-300 IOPAMIDOL (ISOVUE-300) INJECTION 61% COMPARISON:  03/12/2016 chest CT FINDINGS: Lower chest: Small right effusion with several bilateral cavitary opacities in both lower lobes concerning for septic emboli or cavitary pneumonia. More confluent airspace opacity in the left lower lobe is also noted. The visualized heart is normal in size. There is no pericardial effusion. No  large central pulmonary embolus. Hepatobiliary: Normal appearance of the liver without space-occupying mass or biliary dilatation. Cholecystectomy. Pancreas: No pancreatic mass or ductal dilatation. Mild enlargement of the pancreatic head with surrounding peripancreatic fluid is noted. Correlate to exclude a pancreatitis. Spleen: The spleen is enlarged measuring 15.7 x 5.6 x 17 cm (volume = 780 cm^3) and demonstrates a developmental cleft, partially visualized on prior chest CT and therefore believed less likely to represent a laceration. Adrenals/Urinary Tract: Adrenal glands are unremarkable. Kidneys are normal, without renal calculi, solid appearing lesions, or hydronephrosis. There appear to be pair of water attenuating cysts in the lower pole the right kidney measuring up to 2 x 1.3 cm in aggregate. Bladder is unremarkable. Stomach/Bowel: Stomach is within normal limits. Appendix appears normal. No evidence of bowel wall thickening, distention, or inflammatory changes. Vascular/Lymphatic: No significant vascular findings are present. No enlarged abdominal or pelvic lymph nodes. Reproductive: Uterus and bilateral adnexa are unremarkable. Other: Moderate volume of ascites.  Anasarca. Musculoskeletal: No acute or significant osseous findings. IMPRESSION: 1. Bibasilar, multifocal cavitary lesions noted within the visualized lower lobes consistent with septic emboli or possibly cavitary pneumonia. More confluent airspace disease in the left lower lobe without cavitation is also present. There is a small right pleural effusion. 2. Anasarca with moderate volume of ascites noted within the abdomen and pelvis. Question right heart dysfunction/failure. 3. Splenomegaly. 4. Water attenuating cysts in the lower pole the right kidney in aggregate measuring 2 x 1.3 cm. 5. Status post cholecystectomy. Electronically Signed   By: Tollie Eth M.D.   On: 11/14/2016 16:26      Scheduled Meds: . [MAR Hold]  buprenorphine-naloxone  2 tablet Sublingual Daily  . [MAR Hold] busPIRone  10 mg Oral BID  . [MAR Hold] citalopram  10 mg Oral Daily  . [MAR Hold] gabapentin  300 mg Oral TID  . [MAR Hold] heparin  5,000 Units Subcutaneous Q8H  . [MAR Hold] nicotine  14 mg Transdermal Daily   Continuous Infusions: . [MAR Hold] sodium chloride 250 mL (11/15/16 0500)  . sodium chloride    . [MAR Hold] ceFTAROline (TEFLARO) IV Stopped (11/16/16 0748)     LOS: 6 days    Time spent in minutes: 35    Calvert Cantor, MD [facility] Pager: [email or web address] Password [email or web address] 11/16/2016, 2:32 PM

## 2016-11-16 NOTE — Progress Notes (Signed)
Name: Savannah Palmer MRN: [id number] DOB: December 18, 1990    ADMISSION DATE:  11/10/2016 CONSULTATION DATE:  11/11/2016  REFERRING MD :  Dr. Ethelda Chick   CHIEF COMPLAINT:  Endocarditis   Brief: 26 year old female with PMH of Anxiety/Depression, Polysubstance Abuse (Heronin, Cocaine)   Presents to ED on 5/26 with Dyspnea and chest pain for one week. Reports Heronin use morning of arrival. Upon arrival to ED patient hypotensive, tachycardiac - non-responsive to fluid bolus, placed on neo gtt. Admitted to ICU under [facility] service. Blood cultures on 5/27 positive for MRSA, ID consulted. 2D Echo revealed right-sided vegetation.   Taken for TEE on 6/1 which revealed EF 55-60% with a 8x5 mm vegetation attached to posterior leaflet, severe regurgitation. Patient arrived back to ICU, presented with shortness of breath and tachypnea. CXR revealed moderately large right-sided pneumothorax.    SIGNIFICANT EVENTS  5/26 > Presents to ED 6/1 > TEE   STUDIES:  CXR 5/26 > New bilateral nodular airspace opacities throughout the upper and lower lobes bilaterally. Findings are concerning for multifocal infection such as septic emboli. CT Chest 5/26 > Numerous cavitary consolidations throughout both lungs, with a peripheral distribution suggesting septic emboli. Largest cavitary consolidation is within the left lower lobe measuring approximately 4 cm greatest dimension CT A/P 5/30 > Bibasilar, multifocal cavitary lesions noted within the visualized lower lobes consistent with septic emboli or possibly cavitary pneumonia. More confluent airspace disease in the left lower lobe without cavitation is also present. There is a small right pleural effusion. Anasarca with moderate volume of ascites noted within the abdomen and pelvis. Question right heart dysfunction/failure. Splenomegaly. Water attenuating cysts in the lower pole the right kidney in aggregate measuring 2 x 1.3 cm. Status post cholecystectomy. CXR 6/1 >  Moderately large right-sided pneumothorax new from the prior exam. Near complete collapse of the right lower lobe is noted  SUBJECTIVE:  In respiratory distress. Pain to shoulder.   VITAL SIGNS: Temp:  [98.4 F (36.9 C)-99 F (37.2 C)] 99 F (37.2 C) (06/01 1458) Pulse Rate:  [104-129] 124 (06/01 1650) Resp:  [20-51] 51 (06/01 1650) BP: (108-145)/(68-97) 140/86 (06/01 1650) SpO2:  [87 %-97 %] 94 % (06/01 1650)  PHYSICAL EXAMINATION: General:  Adult female, mild respiratory distress  Neuro:  Alert, oriented, follows commands  HEENT:  Normocephalic  Cardiovascular:  Tachycardiac, no MRG Lungs:  Absent breath sounds to right, diminished to left, labored  Abdomen:  Non-tender, non-distended, active bowel sounds  Musculoskeletal:  -edema  Skin:  Warm, dry   Recent Labs Lab 11/14/16 0033 11/15/16 0622 11/16/16 0548  NA 139 135 139  K 4.4 4.2 3.9  CL 114* 104 106  CO2 19* 23 26  BUN 13 9 11   CREATININE 0.74 0.72 0.77  GLUCOSE 106* 98 94    Recent Labs Lab 11/14/16 1110 11/15/16 0622 11/16/16 0548  HGB 8.8* 8.8* 9.3*  HCT 26.3* 26.3* 27.9*  WBC 12.7* 13.0* 11.4*  PLT 123* 132* 160   Dg Chest Port 1 View  Result Date: 11/16/2016 CLINICAL DATA:  Shortness of Breath EXAM: PORTABLE CHEST 1 VIEW COMPARISON:  11/11/2016 FINDINGS: Cardiac shadow is within normal limits. Cavitary nodular densities are noted throughout both lungs similar to that seen on previous CT examination. A new right-sided moderately large pneumothorax is noted with consolidation of the lower lobe on the right. No acute bony abnormality is noted. IMPRESSION: Moderately large right-sided pneumothorax new from the prior exam. Near complete collapse of the right lower lobe  is noted. Stable cavitary lesions within both lungs. Critical Value/emergent results were called by telephone at the time of interpretation on 11/16/2016 at 6:24 pm to Dr Leitha BleakKatalina, who verbally acknowledged these results. Electronically Signed    By: Alcide CleverMark  Lukens M.D.   On: 11/16/2016 18:27    ASSESSMENT / PLAN:  Acute Hypoxic Respiratory Distress secondary to Right Side Pneumothorax post-procedure vs spontaneous  Septic Emboli to Lungs  Plan  -Place Chest Tube > Maintain Suction  -Obtain Repeat CXR after placement -Trend CXR  -Maintain Oxygen Saturation >92 (Place on NRB until Chest Tube in place)   MRSA Bacteremia in setting of endocarditis with septic emboli  -TEE with 8x5 mm vegetation attached to posterior leaflet, severe regurgitation -HIV and Hepatitis panel negative  Plan  -ID following  -Trend Fever and WBC curve  -Continue Teflaro  -Follow Culture Data   CC Time: 45 minutes   Jovita KussmaulKatalina Sadako Cegielski, AGACNP-BC [facility]  Pgr: [phone]  PCCM Pgr: [phone]

## 2016-11-16 NOTE — Progress Notes (Signed)
Call[facility] Endoscopy [facility]. They stated patient is on the schedule for 1400 and for me to arrange transport for patient to arrive close to 12. [facility] set up to pick patient up around 12-12:30.

## 2016-11-16 NOTE — Progress Notes (Signed)
  Progress Note   Date: 11/16/2016  Patient Name: Savannah Palmer        MRN#: [id number]   Clarification of the diagnosis of respiratory failure:    - with hypoxia  This is respiratory failure due to hypoxia in context of right sided endocarditis having caused septic emboli to the lungs.  This was in response to coding query. I have responded now three times    Acey Lavornelius Van Dam, MD

## 2016-11-16 NOTE — Anesthesia Preprocedure Evaluation (Signed)
Anesthesia Evaluation  Patient identified by MRN, date of birth, ID band Patient awake    Reviewed: Allergy & Precautions, NPO status , Patient's Chart, lab work & pertinent test results  Airway Mallampati: III  TM Distance: >3 FB Neck ROM: Full    Dental no notable dental hx.    Pulmonary Current Smoker,    Pulmonary exam normal breath sounds clear to auscultation       Cardiovascular negative cardio ROS Normal cardiovascular exam Rhythm:Regular Rate:Normal     Neuro/Psych Anxiety Depression negative neurological ROS     GI/Hepatic negative GI ROS, (+)     substance abuse  cocaine use, methamphetamine use and IV drug use, Hepatitis -, C  Endo/Other  negative endocrine ROS  Renal/GU negative Renal ROS  negative genitourinary   Musculoskeletal negative musculoskeletal ROS (+)   Abdominal   Peds negative pediatric ROS (+)  Hematology  (+) anemia ,   Anesthesia Other Findings Ring in left nares  Reproductive/Obstetrics negative OB ROS                             Anesthesia Physical Anesthesia Plan  ASA: III  Anesthesia Plan: MAC   Post-op Pain Management:    Induction: Intravenous  Airway Management Planned: Simple Face Mask and Nasal Cannula  Additional Equipment:   Intra-op Plan:   Post-operative Plan:   Informed Consent: I have reviewed the patients History and Physical, chart, labs and discussed the procedure including the risks, benefits and alternatives for the proposed anesthesia with the patient or authorized representative who has indicated his/her understanding and acceptance.   Dental advisory given  Plan Discussed with: CRNA  Anesthesia Plan Comments:         Anesthesia Quick Evaluation

## 2016-11-16 NOTE — Anesthesia Postprocedure Evaluation (Signed)
Anesthesia Post Note  Patient: Savannah Palmer  Procedure(s) Performed: Procedure(s) (LRB): TRANSESOPHAGEAL ECHOCARDIOGRAM (TEE) (N/A)     Patient location during evaluation: PACU Anesthesia Type: MAC Level of consciousness: awake and alert Pain management: pain level controlled Vital Signs Assessment: post-procedure vital signs reviewed and stable Respiratory status: spontaneous breathing, nonlabored ventilation, respiratory function stable and patient connected to nasal cannula oxygen Cardiovascular status: stable and blood pressure returned to baseline Anesthetic complications: no    Last Vitals:  Vitals:   11/16/16 1555 11/16/16 1606  BP: 137/88 135/80  Pulse: (!) 104 (!) 113  Resp: (!) 22 (!) 39  Temp:      Last Pain:  Vitals:   11/16/16 1458  TempSrc: Oral  PainSc:                  Spenser Cong P Jocilyn Trego

## 2016-11-16 NOTE — Procedures (Signed)
PIGTAIL CHEST TUBE INSERTION   Indication: pneumothorax Consent: yes Time out: yes Appropriate position: yes Hand washing: yes Patient Sterilized and Draped: yes Location: Right lateral chest wall 4th intercostal space # of Attempts: 1 Ultrasound Guidance: yes Insertion depth: 20 cm  The patient received 2mg  IV versed for anxiolysis and vitals were monitored Q245min during the procedure.  Pocket was visualized with US and 25g needle was inserted and lidocaine 1% was used to numb the subcutaneous and intercostal space.  The needle was advanced and air was aspirated from the pleural space.  The guidewire was placed and the tube was inserted via the Seldinger technique.  After insertion pleural fluid and air was aspirated.  The tube was attached to wall suction at 20cm H2O.  Post procedure CXR demonstrated appropriate location of tube with persistent pneumothorax.  CXR:   Pneumothorax: yes  Tube position appropriate: yes Sutured in place: yes EBL: 5 cc Complications: no Patient Tolerated Procedure Well: yes     Galvin Profferaniel Snigdha Howser, DO., MS [facility]

## 2016-11-16 NOTE — Op Note (Signed)
INDICATIONS: infective endocarditis  PROCEDURE:   Informed consent was obtained prior to the procedure. The risks, benefits and alternatives for the procedure were discussed and the patient comprehended these risks.  Risks include, but are not limited to, cough, sore throat, vomiting, nausea, somnolence, esophageal and stomach trauma or perforation, bleeding, low blood pressure, aspiration, pneumonia, infection, trauma to the teeth and death.    After a procedural time-out, the oropharynx was anesthetized with 20% benzocaine spray.   During this procedure the patient was administeredIV propofol by Anesthesiology.  The transesophageal probe was inserted in the esophagus and stomach without difficulty and multiple views were obtained.  The patient was kept under observation until the patient left the procedure room.  The patient left the procedure room in stable condition.   Agitated microbubble saline contrast was not administered.  COMPLICATIONS:    There were no immediate complications.  FINDINGS:  Large tricuspid valve vegetation. Severe TR. Dilated RV. No other vegetations and normal LVEF. Complex loculated left pleural effusion. Otherwise normal study.  RECOMMENDATIONS:     Antibiotic therapy. Consider evacuation of left empyema  Time Spent Directly with the Patient:  30 minutes   Amarie Tarte 11/16/2016, 2:48 PM

## 2016-11-16 NOTE — Interval H&P Note (Signed)
History and Physical Interval Note:  11/16/2016 1:27 PM  [name]  has presented today for surgery, with the diagnosis of BACTEREMIA  The various methods of treatment have been discussed with the patient and family. After consideration of risks, benefits and other options for treatment, the patient has consented to  Procedure(s): TRANSESOPHAGEAL ECHOCARDIOGRAM (TEE) (N/A) as a surgical intervention .  The patient's history has been reviewed, patient examined, no change in status, stable for surgery.  I have reviewed the patient's chart and labs.  Questions were answered to the patient's satisfaction.     Kadar Chance

## 2016-11-16 NOTE — Progress Notes (Signed)
Notified MD Rizwan in regards to patient's declining respiratory status. [facility] was [facility] from [facility] on [address] [place]. Patient was anxious breathing between approx 35-45 RR/min. Administered 0.5 mg of Xanax and notified MD Rizwan in regards to patient's tachypnea, increased WOB, SOB at rest, patient having diminished breath sounds on bilaterally upper and lower lung fields. Patient also complaining about right sided chest pain. MD ordered for STAT CXR to be placed.

## 2016-11-16 NOTE — Progress Notes (Signed)
[facility]  Date of Admission:  11/10/2016           Day 7 vancomycin         Day 2 ceftaroline  Principal Problem:   MRSA bacteremia Active Problems:   Septic pulmonary embolism (HCC)   Septic shock (HCC)   Endocarditis   IVDU (intravenous drug user)   Polysubstance (including opioids) dependence, daily use (HCC)   Encounter for central line placement   Respiratory failure (HCC)   Hypokalemia   Hypophosphatemia   Thrombocytopenia (HCC)   RUQ pain   Right hip pain   . [MAR Hold] buprenorphine-naloxone  2 tablet Sublingual Daily  . [MAR Hold] busPIRone  10 mg Oral BID  . [MAR Hold] citalopram  10 mg Oral Daily  . [MAR Hold] gabapentin  300 mg Oral TID  . [MAR Hold] heparin  5,000 Units Subcutaneous Q8H  . [MAR Hold] nicotine  14 mg Transdermal Daily    SUBJECTIVE: She is feeling a little bit better. She is still having pleuritic chest pain and dry cough. She lives here in [place] with her parents. She tells me that she was not able to get into a Suboxone clinic recently because she did not have proper identification.  Review of Systems: Review of Systems  Constitutional: Positive for chills, diaphoresis, fever and malaise/fatigue. Negative for weight loss.  HENT: Negative for sore throat.   Respiratory: Positive for cough and shortness of breath. Negative for sputum production.   Cardiovascular: Positive for chest pain.  Gastrointestinal: Negative for abdominal pain, diarrhea, heartburn, nausea and vomiting.  Genitourinary: Negative for dysuria and frequency.  Musculoskeletal: Negative for joint pain and myalgias.  Skin: Negative for rash.  Neurological: Negative for dizziness and headaches.  Psychiatric/Behavioral: Positive for substance abuse. Negative for depression. The patient is not nervous/anxious.     Past Medical History:  Diagnosis Date  . Anxiety   . Depression   . IVDU (intravenous drug user)   . Smoker      Social History  Substance Use Topics  . Smoking status: Current Some Day Smoker    Packs/day: 1.00  . Smokeless tobacco: Never Used  . Alcohol use Yes     Comment: occasinally socially    Family History  Problem Relation Age of Onset  . Tuberculosis Neg Hx   . Diabetes Mellitus II Neg Hx    No Known Allergies  OBJECTIVE: Vitals:   11/16/16 0925 11/16/16 1030 11/16/16 1035 11/16/16 1305  BP: 128/78   (!) 145/97  Pulse: (!) 129   (!) 118  Resp: (!) 22 (!) 22 20 20   Temp: 98.6 F (37 C)   99 F (37.2 C)  TempSrc: Oral   Oral  SpO2: 92% (!) 87% 96% 96%  Weight:      Height:       Body mass index is 25.5 kg/m.  Physical Exam  Constitutional: She is oriented to person, place, and time.  She is pleasant and in no distress. She is sitting up in bed.  Cardiovascular: Regular rhythm.   No murmur heard. She is tachycardic.  Pulmonary/Chest: Effort normal and breath sounds normal. She has no wheezes. She has no rales.  Abdominal: Soft. There is no tenderness.  Musculoskeletal: Normal range of motion. She exhibits no edema or tenderness.  Neurological: She is alert and oriented to person, place, and time.  Skin: No rash noted.  Psychiatric: Mood and affect normal.  Lab Results Lab Results  Component Value Date   WBC 11.4 (H) 11/16/2016   HGB 9.3 (L) 11/16/2016   HCT 27.9 (L) 11/16/2016   MCV 84.3 11/16/2016   PLT 160 11/16/2016    Lab Results  Component Value Date   CREATININE 0.77 11/16/2016   BUN 11 11/16/2016   NA 139 11/16/2016   K 3.9 11/16/2016   CL 106 11/16/2016   CO2 26 11/16/2016    Lab Results  Component Value Date   ALT 16 11/10/2016   AST 25 11/10/2016   ALKPHOS 164 (H) 11/10/2016   BILITOT 0.9 11/10/2016    Vancomycin trough level 11/16/2016: 19  Microbiology: Recent Results (from the past 240 hour(s))  Blood Culture (routine x 2)     Status: Abnormal   Collection Time: 11/10/16  2:45 PM  Result Value Ref Range Status   Specimen  Description BLOOD LEFT HAND  Final   Special Requests IN PEDIATRIC BOTTLE Blood Culture adequate volume  Final   Culture  Setup Time   Final    GRAM POSITIVE COCCI IN CLUSTERS IN PEDIATRIC BOTTLE CRITICAL VALUE NOTED.  VALUE IS CONSISTENT WITH PREVIOUSLY REPORTED AND CALLED VALUE.    Culture (A)  Final    STAPHYLOCOCCUS AUREUS SUSCEPTIBILITIES PERFORMED ON PREVIOUS CULTURE WITHIN THE LAST 5 DAYS. Performed at [facility], [address], [place], [place] [address]    Report Status 11/13/2016 FINAL  Final  Blood Culture (routine x 2)     Status: Abnormal   Collection Time: 11/10/16  2:55 PM  Result Value Ref Range Status   Specimen Description BLOOD LEFT ARM  Final   Special Requests   Final    BOTTLES DRAWN AEROBIC AND ANAEROBIC Blood Culture adequate volume   Culture  Setup Time   Final    IN BOTH AEROBIC AND ANAEROBIC BOTTLES GRAM POSITIVE COCCI IN CLUSTERS CRITICAL RESULT CALLED TO, READ BACK BY AND VERIFIED WITH: TO JGRIMSLEY(PHARMd) BY TCLEVELAND 11/11/2016 AT 6:15AM Performed at [facility], [address], [place], [place] [address]    Culture METHICILLIN RESISTANT STAPHYLOCOCCUS AUREUS (A)  Final   Report Status 11/13/2016 FINAL  Final   Organism ID, Bacteria METHICILLIN RESISTANT STAPHYLOCOCCUS AUREUS  Final      Susceptibility   Methicillin resistant staphylococcus aureus - MIC*    CIPROFLOXACIN >=8 RESISTANT Resistant     ERYTHROMYCIN >=8 RESISTANT Resistant     GENTAMICIN <=0.5 SENSITIVE Sensitive     OXACILLIN >=4 RESISTANT Resistant     TETRACYCLINE <=1 SENSITIVE Sensitive     VANCOMYCIN 1 SENSITIVE Sensitive     TRIMETH/SULFA <=10 SENSITIVE Sensitive     CLINDAMYCIN <=0.25 SENSITIVE Sensitive     RIFAMPIN <=0.5 SENSITIVE Sensitive     Inducible Clindamycin NEGATIVE Sensitive     * METHICILLIN RESISTANT STAPHYLOCOCCUS AUREUS  Blood Culture ID Panel (Reflexed)     Status: Abnormal   Collection Time: 11/10/16  2:55 PM  Result Value Ref Range Status     Enterococcus species NOT DETECTED NOT DETECTED Final   Listeria monocytogenes NOT DETECTED NOT DETECTED Final   Staphylococcus species DETECTED (A) NOT DETECTED Final    Comment: CRITICAL RESULT CALLED TO, READ BACK BY AND VERIFIED WITH: TO JGRIMSLEY(PHARMd) BY TCLEVELAND 11/11/2016 AT 6:15AM    Staphylococcus aureus DETECTED (A) NOT DETECTED Final    Comment: Methicillin (oxacillin)-resistant Staphylococcus aureus (MRSA). MRSA is predictably resistant to beta-lactam antibiotics (except ceftaroline). Preferred therapy is vancomycin unless clinically contraindicated. Patient requires  contact precautions if  hospitalized. CRITICAL RESULT CALLED TO, READ BACK BY AND VERIFIED WITH: TO JGRIMSLEY(PHARMd) BY TCLEVELAND 11/11/2016 AT 6:15AM    Methicillin resistance DETECTED (A) NOT DETECTED Final    Comment: CRITICAL RESULT CALLED TO, READ BACK BY AND VERIFIED WITH: TO JGRIMSLEY(PHARMd) BY TCLEVELAND 11/11/2016 AT 6:15AM    Streptococcus species NOT DETECTED NOT DETECTED Final   Streptococcus agalactiae NOT DETECTED NOT DETECTED Final   Streptococcus pneumoniae NOT DETECTED NOT DETECTED Final   Streptococcus pyogenes NOT DETECTED NOT DETECTED Final   Acinetobacter baumannii NOT DETECTED NOT DETECTED Final   Enterobacteriaceae species NOT DETECTED NOT DETECTED Final   Enterobacter cloacae complex NOT DETECTED NOT DETECTED Final   Escherichia coli NOT DETECTED NOT DETECTED Final   Klebsiella oxytoca NOT DETECTED NOT DETECTED Final   Klebsiella pneumoniae NOT DETECTED NOT DETECTED Final   Proteus species NOT DETECTED NOT DETECTED Final   Serratia marcescens NOT DETECTED NOT DETECTED Final   Haemophilus influenzae NOT DETECTED NOT DETECTED Final   Neisseria meningitidis NOT DETECTED NOT DETECTED Final   Pseudomonas aeruginosa NOT DETECTED NOT DETECTED Final   Candida albicans NOT DETECTED NOT DETECTED Final   Candida glabrata NOT DETECTED NOT DETECTED Final   Candida krusei NOT DETECTED NOT  DETECTED Final   Candida parapsilosis NOT DETECTED NOT DETECTED Final   Candida tropicalis NOT DETECTED NOT DETECTED Final    Comment: Performed at [facility], [address] [place], [place] [address]  Urine culture     Status: None   Collection Time: 11/10/16  9:32 PM  Result Value Ref Range Status   Specimen Description URINE, CLEAN CATCH  Final   Special Requests NONE  Final   Culture   Final    NO GROWTH Performed at [facility], [address] [place], [place] [address]    Report Status 11/12/2016 FINAL  Final  Culture, blood (x 2)     Status: Abnormal   Collection Time: 11/10/16  9:46 PM  Result Value Ref Range Status   Specimen Description BLOOD LEFT ANTECUBITAL  Final   Special Requests IN PEDIATRIC BOTTLE Blood Culture adequate volume  Final   Culture  Setup Time   Final    GRAM POSITIVE COCCI IN CLUSTERS IN PEDIATRIC BOTTLE CRITICAL VALUE NOTED.  VALUE IS CONSISTENT WITH PREVIOUSLY REPORTED AND CALLED VALUE.    Culture (A)  Final    STAPHYLOCOCCUS AUREUS SUSCEPTIBILITIES PERFORMED ON PREVIOUS CULTURE WITHIN THE LAST 5 DAYS. Performed at [facility], [address] [place], [place] [address]    Report Status 11/13/2016 FINAL  Final  Culture, blood (x 2)     Status: Abnormal   Collection Time: 11/10/16  9:46 PM  Result Value Ref Range Status   Specimen Description BLOOD LEFT HAND  Final   Special Requests IN PEDIATRIC BOTTLE Blood Culture adequate volume  Final   Culture  Setup Time   Final    GRAM POSITIVE COCCI IN CLUSTERS IN PEDIATRIC BOTTLE CRITICAL RESULT CALLED TO, READ BACK BY AND VERIFIED WITH: D WOFFORD,PHARMD AT 1353 11/11/16 BY L BENFIELD    Culture (A)  Final    STAPHYLOCOCCUS AUREUS SUSCEPTIBILITIES PERFORMED ON PREVIOUS CULTURE WITHIN THE LAST 5 DAYS. Performed at [facility], [address] [place], [place] [address]    Report Status 11/13/2016 FINAL  Final  MRSA PCR Screening     Status: Abnormal   Collection  Time: 11/10/16  9:58 PM  Result Value  Ref Range Status   MRSA by PCR POSITIVE (A) NEGATIVE Final    Comment:        The GeneXpert MRSA Assay (FDA approved for NASAL specimens only), is one component of a comprehensive MRSA colonization surveillance program. It is not intended to diagnose MRSA infection nor to guide or monitor treatment for MRSA infections. RESULT CALLED TO, READ BACK BY AND VERIFIED WITH: ABBY CHAVEZ,RN [id number] @ 2357 BY J SCOTTON   Culture, blood (Routine X 2) w Reflex to ID Panel     Status: Abnormal   Collection Time: 11/11/16 10:30 AM  Result Value Ref Range Status   Specimen Description BLOOD RIGHT HAND  Final   Special Requests   Final    BOTTLES DRAWN AEROBIC AND ANAEROBIC Blood Culture adequate volume IMMUNOCOMPROMISED   Culture  Setup Time   Final    GRAM POSITIVE COCCI IN CLUSTERS IN BOTH AEROBIC AND ANAEROBIC BOTTLES CRITICAL VALUE NOTED.  VALUE IS CONSISTENT WITH PREVIOUSLY REPORTED AND CALLED VALUE.    Culture (A)  Final    STAPHYLOCOCCUS AUREUS SUSCEPTIBILITIES PERFORMED ON PREVIOUS CULTURE WITHIN THE LAST 5 DAYS. Performed at [facility], [address], [place], [place] [address]    Report Status 11/16/2016 FINAL  Final  Culture, blood (Routine X 2) w Reflex to ID Panel     Status: Abnormal   Collection Time: 11/11/16 10:34 AM  Result Value Ref Range Status   Specimen Description BLOOD BLOOD RIGHT HAND  Final   Special Requests   Final    BOTTLES DRAWN AEROBIC AND ANAEROBIC Blood Culture adequate volume   Culture  Setup Time   Final    GRAM POSITIVE COCCI IN CLUSTERS IN BOTH AEROBIC AND ANAEROBIC BOTTLES CRITICAL RESULT CALLED TO, READ BACK BY AND VERIFIED WITH: T. PICKERING PHARMD, AT 0702 11/12/16 BY D. VANHOOK    Culture (A)  Final    STAPHYLOCOCCUS AUREUS SUSCEPTIBILITIES PERFORMED ON PREVIOUS CULTURE WITHIN THE LAST 5 DAYS. Performed at [facility], [address] [place], [place] [address]    Report Status 11/14/2016  FINAL  Final  Culture, blood (Routine X 2) w Reflex to ID Panel     Status: Abnormal   Collection Time: 11/12/16  5:42 PM  Result Value Ref Range Status   Specimen Description BLOOD RIGHT ARM  Final   Special Requests IN PEDIATRIC BOTTLE Blood Culture adequate volume  Final   Culture  Setup Time   Final    GRAM POSITIVE COCCI IN CLUSTERS IN PEDIATRIC BOTTLE CRITICAL RESULT CALLED TO, READ BACK BY AND VERIFIED WITH: N. GLOGOVAC PHARMD, AT 1478 11/13/16 BY D. VANHOOK    Culture (A)  Final    STAPHYLOCOCCUS AUREUS SUSCEPTIBILITIES PERFORMED ON PREVIOUS CULTURE WITHIN THE LAST 5 DAYS. Performed at [facility], [address], [place], [place] [address]    Report Status 11/15/2016 FINAL  Final  Culture, blood (Routine X 2) w Reflex to ID Panel     Status: Abnormal   Collection Time: 11/12/16  5:43 PM  Result Value Ref Range Status   Specimen Description BLOOD RIGHT ARM  Final   Special Requests   Final    BOTTLES DRAWN AEROBIC AND ANAEROBIC Blood Culture adequate volume   Culture  Setup Time   Final    GRAM POSITIVE COCCI IN CLUSTERS ANAEROBIC BOTTLE ONLY CRITICAL VALUE NOTED.  VALUE IS CONSISTENT WITH PREVIOUSLY REPORTED AND CALLED VALUE.    Culture (A)  Final  STAPHYLOCOCCUS AUREUS SUSCEPTIBILITIES PERFORMED ON PREVIOUS CULTURE WITHIN THE LAST 5 DAYS. Performed at [facility], [address], [place], [place] [address]    Report Status 11/15/2016 FINAL  Final  Culture, blood (Routine X 2) w Reflex to ID Panel     Status: None (Preliminary result)   Collection Time: 11/13/16 10:49 AM  Result Value Ref Range Status   Specimen Description BLOOD RIGHT ANTECUBITAL  Final   Special Requests   Final    BOTTLES DRAWN AEROBIC ONLY Blood Culture adequate volume   Culture   Final    NO GROWTH 3 DAYS Performed at [facility], [address], [place], [place] [address]    Report Status PENDING  Incomplete  Culture, blood (Routine X 2) w Reflex to ID Panel      Status: Abnormal   Collection Time: 11/13/16 10:52 AM  Result Value Ref Range Status   Specimen Description BLOOD LEFT ARM  Final   Special Requests IN PEDIATRIC BOTTLE Blood Culture adequate volume  Final   Culture  Setup Time   Final    IN PEDIATRIC BOTTLE GRAM POSITIVE COCCI IN CLUSTERS Organism ID to follow CRITICAL RESULT CALLED TO, READ BACK BY AND VERIFIED WITH: TO BGREEN(PHARMd) BY TCLEVELAND 11/14/2016 AT 5:05AM    Culture (A)  Final    STAPHYLOCOCCUS AUREUS SUSCEPTIBILITIES PERFORMED ON PREVIOUS CULTURE WITHIN THE LAST 5 DAYS. Performed at [facility], [address] [place], [place] [address]    Report Status 11/16/2016 FINAL  Final  Blood Culture ID Panel (Reflexed)     Status: Abnormal   Collection Time: 11/13/16 10:52 AM  Result Value Ref Range Status   Enterococcus species NOT DETECTED NOT DETECTED Final   Listeria monocytogenes NOT DETECTED NOT DETECTED Final   Staphylococcus species DETECTED (A) NOT DETECTED Final    Comment: CRITICAL RESULT CALLED TO, READ BACK BY AND VERIFIED WITH: TO BGREEN(PHARMd) BY TCLEVELAND 11/14/16 AT 5:05AM    Staphylococcus aureus DETECTED (A) NOT DETECTED Final    Comment: Methicillin (oxacillin)-resistant Staphylococcus aureus (MRSA). MRSA is predictably resistant to beta-lactam antibiotics (except ceftaroline). Preferred therapy is vancomycin unless clinically contraindicated. Patient requires contact precautions if  hospitalized. CRITICAL RESULT CALLED TO, READ BACK BY AND VERIFIED WITH: TO BGREEN(PHARMd) BY TCLEVELAND 11/14/16 AT 5:05AM    Methicillin resistance DETECTED (A) NOT DETECTED Final    Comment: CRITICAL RESULT CALLED TO, READ BACK BY AND VERIFIED WITH: TO BGREEN(PHARMd) BY TCLEVELAND 11/14/16 AT 5:05AM    Streptococcus species NOT DETECTED NOT DETECTED Final   Streptococcus agalactiae NOT DETECTED NOT DETECTED Final   Streptococcus pneumoniae NOT DETECTED NOT DETECTED Final   Streptococcus pyogenes NOT DETECTED NOT  DETECTED Final   Acinetobacter baumannii NOT DETECTED NOT DETECTED Final   Enterobacteriaceae species NOT DETECTED NOT DETECTED Final   Enterobacter cloacae complex NOT DETECTED NOT DETECTED Final   Escherichia coli NOT DETECTED NOT DETECTED Final   Klebsiella oxytoca NOT DETECTED NOT DETECTED Final   Klebsiella pneumoniae NOT DETECTED NOT DETECTED Final   Proteus species NOT DETECTED NOT DETECTED Final   Serratia marcescens NOT DETECTED NOT DETECTED Final   Haemophilus influenzae NOT DETECTED NOT DETECTED Final   Neisseria meningitidis NOT DETECTED NOT DETECTED Final   Pseudomonas aeruginosa NOT DETECTED NOT DETECTED Final   Candida albicans NOT DETECTED NOT DETECTED Final   Candida glabrata NOT DETECTED NOT DETECTED Final   Candida krusei NOT DETECTED NOT DETECTED Final   Candida parapsilosis NOT DETECTED NOT  DETECTED Final   Candida tropicalis NOT DETECTED NOT DETECTED Final    Comment: Performed at [facility], [address], [place], [place] [address]  Culture, blood (Routine X 2) w Reflex to ID Panel     Status: None (Preliminary result)   Collection Time: 11/14/16 12:33 AM  Result Value Ref Range Status   Specimen Description BLOOD RIGHT ANTECUBITAL  Final   Special Requests   Final    BOTTLES DRAWN AEROBIC ONLY Blood Culture adequate volume   Culture   Final    NO GROWTH 2 DAYS Performed at [facility], [address] [place], [place] [address]    Report Status PENDING  Incomplete  Culture, blood (Routine X 2) w Reflex to ID Panel     Status: Abnormal   Collection Time: 11/14/16 12:33 AM  Result Value Ref Range Status   Specimen Description BLOOD LEFT HAND  Final   Special Requests IN PEDIATRIC BOTTLE Blood Culture adequate volume  Final   Culture  Setup Time   Final    GRAM POSITIVE COCCI IN CLUSTERS IN PEDIATRIC BOTTLE CRITICAL VALUE NOTED.  VALUE IS CONSISTENT WITH PREVIOUSLY REPORTED AND CALLED VALUE.    Culture (A)  Final    STAPHYLOCOCCUS  AUREUS SUSCEPTIBILITIES PERFORMED ON PREVIOUS CULTURE WITHIN THE LAST 5 DAYS. Performed at [facility], [address], [place], [place] [address]    Report Status 11/16/2016 FINAL  Final  Culture, blood (Routine X 2) w Reflex to ID Panel     Status: Abnormal (Preliminary result)   Collection Time: 11/14/16  4:10 PM  Result Value Ref Range Status   Specimen Description BLOOD RIGHT ARM  Final   Special Requests IN PEDIATRIC BOTTLE Blood Culture adequate volume  Final   Culture  Setup Time   Final    IN PEDIATRIC BOTTLE GRAM POSITIVE COCCI IN CLUSTERS CRITICAL RESULT CALLED TO, READ BACK BY AND VERIFIED WITH: PHARMD A PHAM [id number] 1036AM MLM    Culture (A)  Final    STAPHYLOCOCCUS AUREUS SUSCEPTIBILITIES PERFORMED ON PREVIOUS CULTURE WITHIN THE LAST 5 DAYS. Performed at [facility], [address] [place], [place] [address]    Report Status PENDING  Incomplete  Culture, blood (Routine X 2) w Reflex to ID Panel     Status: None (Preliminary result)   Collection Time: 11/14/16  4:10 PM  Result Value Ref Range Status   Specimen Description BLOOD RIGHT ARM  Final   Special Requests   Final    BOTTLES DRAWN AEROBIC ONLY Blood Culture adequate volume   Culture   Final    NO GROWTH 2 DAYS Performed at [facility], 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 Her most recent blood cultures remain positive 5 days into therapy and remains febrile. This is probably related to the large size of her vegetation and vancomycin levels that have been subtherapeutic until today. I will continue vancomycin and ceftaroline and repeat blood cultures today. The results of her TEE are pending. I talked her about the probable need for weeks of IV antibiotic therapy which would not be safe to administer at home. She told me that she might be  willing to go to a skilled nursing facility.  PLAN: 1. Continue current antibiotics 2. Repeat blood cultures 3. Await results  of TEE 4. Please call Dr. Judyann Munson [phone]) for any infectious disease questions this weekend  Cliffton Asters, MD [facility] [phone] pager   [phone] cell 11/16/2016, 2:30 PM

## 2016-11-17 ENCOUNTER — Encounter (HOSPITAL_COMMUNITY): Payer: Self-pay

## 2016-11-17 DIAGNOSIS — J9311 Primary spontaneous pneumothorax: Secondary | ICD-10-CM

## 2016-11-17 DIAGNOSIS — Z9889 Other specified postprocedural states: Secondary | ICD-10-CM

## 2016-11-17 DIAGNOSIS — F1920 Other psychoactive substance dependence, uncomplicated: Secondary | ICD-10-CM

## 2016-11-17 DIAGNOSIS — I330 Acute and subacute infective endocarditis: Secondary | ICD-10-CM

## 2016-11-17 DIAGNOSIS — R161 Splenomegaly, not elsewhere classified: Secondary | ICD-10-CM

## 2016-11-17 DIAGNOSIS — J939 Pneumothorax, unspecified: Secondary | ICD-10-CM | POA: Diagnosis not present

## 2016-11-17 DIAGNOSIS — B999 Unspecified infectious disease: Secondary | ICD-10-CM | POA: Diagnosis present

## 2016-11-17 DIAGNOSIS — I071 Rheumatic tricuspid insufficiency: Secondary | ICD-10-CM | POA: Diagnosis present

## 2016-11-17 DIAGNOSIS — F1990 Other psychoactive substance use, unspecified, uncomplicated: Secondary | ICD-10-CM

## 2016-11-17 DIAGNOSIS — J9601 Acute respiratory failure with hypoxia: Secondary | ICD-10-CM

## 2016-11-17 LAB — GLUCOSE, PLEURAL OR PERITONEAL FLUID: Glucose, Fluid: 44 mg/dL

## 2016-11-17 LAB — BASIC METABOLIC PANEL
Anion gap: 4 — ABNORMAL LOW (ref 5–15)
BUN: 13 mg/dL (ref 6–20)
CO2: 23 mmol/L (ref 22–32)
Calcium: 7.6 mg/dL — ABNORMAL LOW (ref 8.9–10.3)
Chloride: 111 mmol/L (ref 101–111)
Creatinine, Ser: 0.76 mg/dL (ref 0.44–1.00)
GFR calc Af Amer: >60 mL/min (ref >60)
GFR calc non Af Amer: >60 mL/min (ref >60)
Glucose, Bld: 122 mg/dL — ABNORMAL HIGH (ref 65–99)
Potassium: 4.2 mmol/L (ref 3.5–5.1)
SODIUM: 138 mmol/L (ref 135–145)

## 2016-11-17 LAB — CBC
HCT: 24.6 % — ABNORMAL LOW (ref 36.0–46.0)
Hemoglobin: 7.9 g/dL — ABNORMAL LOW (ref 12.0–15.0)
MCH: 27.3 pg (ref 26.0–34.0)
MCHC: 32.1 g/dL (ref 30.0–36.0)
MCV: 85.1 fL (ref 78.0–100.0)
PLATELETS: 179 10*3/uL (ref 150–400)
RBC: 2.89 MIL/uL — ABNORMAL LOW (ref 3.87–5.11)
RDW: 15.2 % (ref 11.5–15.5)
WBC: 8 10*3/uL (ref 4.0–10.5)

## 2016-11-17 LAB — FOLATE: Folate: 7.8 ng/mL (ref >5.9)

## 2016-11-17 LAB — LACTATE DEHYDROGENASE, PLEURAL OR PERITONEAL FLUID: LD, Fluid: 931 U/L — ABNORMAL HIGH (ref 3–23)

## 2016-11-17 LAB — PROTEIN, PLEURAL OR PERITONEAL FLUID: Total protein, fluid: 3.4 g/dL

## 2016-11-17 LAB — RETICULOCYTES
RBC.: 2.76 MIL/uL — ABNORMAL LOW (ref 3.87–5.11)
RETIC CT PCT: 2.5 % (ref 0.4–3.1)
Retic Count, Absolute: 69 10*3/uL (ref 19.0–186.0)

## 2016-11-17 LAB — IRON AND TIBC
Iron: 12 ug/dL — ABNORMAL LOW (ref 28–170)
Saturation Ratios: 7 % — ABNORMAL LOW (ref 10.4–31.8)
TIBC: 165 ug/dL — ABNORMAL LOW (ref 250–450)
UIBC: 153 ug/dL

## 2016-11-17 LAB — CULTURE, BLOOD (ROUTINE X 2): Special Requests: ADEQUATE

## 2016-11-17 LAB — FERRITIN: FERRITIN: 213 ng/mL (ref 11–307)

## 2016-11-17 LAB — VITAMIN B12: VITAMIN B 12: 1850 pg/mL — AB (ref 180–914)

## 2016-11-17 LAB — CREATININE, SERUM
Creatinine, Ser: 0.88 mg/dL (ref 0.44–1.00)
GFR calc non Af Amer: >60 mL/min (ref >60)

## 2016-11-17 LAB — AMYLASE, PLEURAL OR PERITONEAL FLUID: AMYLASE FL: 19 U/L

## 2016-11-17 MED ORDER — SODIUM CHLORIDE 0.9 % IV SOLN
600.0000 mg | Freq: Two times a day (BID) | INTRAVENOUS | Status: DC
  Administered 2016-11-17 – 2016-11-23 (×12): 600 mg via INTRAVENOUS
  Filled 2016-11-17 (×14): qty 600

## 2016-11-17 MED ORDER — SODIUM CHLORIDE 0.9 % IV SOLN
500.0000 mg | INTRAVENOUS | Status: DC
  Administered 2016-11-17 – 2016-11-22 (×6): 500 mg via INTRAVENOUS
  Filled 2016-11-17 (×8): qty 10

## 2016-11-17 NOTE — Assessment & Plan Note (Addendum)
Acute resp failure - needing 3L [place]. This is mild and stable Improving 11/18/2016   Plan o2 for puilse ox > 88% Monitor Encourage IS

## 2016-11-17 NOTE — Progress Notes (Signed)
Pharmacy Antibiotic Note  Savannah [place] is a 26 y.o. female admitted on 11/10/2016 after injecting herself with heroin and crack cocaine. Patient being followed by ID and currently on Vancomycin for septic shock due to MRSA bacteremia, TV IE, and septic emboli to the lungs.    Plan: Day 8 Abx  - ID added Dapto 8mg /kg q24h for MRSA endocarditis(data lacking for Teflaro in endocarditis) - Cont Teflaro for septic pulmonary emboli(dapto does not cover the lung) - Check baseline CK in AM (no labs to add-on to today) - Follow up renal fxn, culture results, and clinical course.   Height: 5\' 3"  (160 cm) Weight: 138 lb 7.2 oz (62.8 kg) IBW/kg (Calculated) : 52.4  Temp (24hrs), Avg:98.4 F (36.9 C), Min:97.2 F (36.2 C), Max:99.5 F (37.5 C)   Recent Labs Lab 11/10/16 1457 11/10/16 1733  11/10/16 2119 11/11/16 0306 11/12/16 0352  11/13/16 1049 11/14/16 0033 11/14/16 1110 11/15/16 0622 11/15/16 2033 11/16/16 0548 11/17/16 0720 11/17/16 0908  WBC  --   --   < > 12.8* 10.5 11.9*  --   --   --  12.7* 13.0*  --  11.4*  --  8.0  CREATININE  --   --   < > 1.04* 0.68 0.59  < >  --  0.74  --  0.72  --  0.77 0.88 0.76  LATICACIDVEN 3.26* 2.11*  --  1.9 1.6  --   --   --   --   --   --   --   --   --   --   VANCOTROUGH  --   --   --   --   --   --   --  13*  --   --   --  19  --   --   --   < > = values in this interval not displayed.  Estimated Creatinine Clearance: 88.2 mL/min (by C-G formula based on SCr of 0.76 mg/dL).    No Known Allergies  Antimicrobials this admission: 5/26 Zosyn x 1 5/26 Vanc >> 6/1 5/26 Cefepime >> 5/27 5/31 Teflaro >>  6/2 Daptomycin >>  Dose adjustments this admission: 5/27: increase vanc to 750 q8 with improved renal function 5/29 1049 VT = 13 mcg/mL on 750mg  q8h prior to 7th dose, inc to 1g q8h 5/31 2030 VT: 19 - continue 1g q8h  Microbiology results: 5/26 UCx: NGF 5/26 BCx: 4/4 MRSA 5/26 MRSA PCR: positive  5/27 BCx: 2/2 MRSA  5/28 BCx: 2/2  MRSA 5/28 HIV antibody: non reactive 5/28 HCV RNA: ND 5/29 BCx: MRSA, BCID = MRSA 5/30 @ 0033 BCx: 1/2 MRSA 5/30 @ 1610 BCx: NGTD 5/31 RPR: ordered   Thank you for allowing pharmacy to be a part of this patient's care.  Charolotte Ekeom Lundon Rosier, PharmD, pager [phone]. 11/17/2016,2:52 PM.

## 2016-11-17 NOTE — Progress Notes (Addendum)
Name: Savannah LimeVictoria L Forero MRN: [id number] DOB: August 04, 1990    ADMISSION DATE:  11/10/2016 CONSULTATION DATE:  11/11/2016  REFERRING MD :  Dr. Ethelda ChickJacubowitz   CHIEF COMPLAINT:  Endocarditis   Brief: 26 year old female with PMH of Anxiety/Depression, Polysubstance Abuse (Heronin, Cocaine)   Presents to ED on 5/26 with Dyspnea and chest pain for one week. Reports Heronin use morning of arrival. Upon arrival to ED patient hypotensive, tachycardiac - non-responsive to fluid bolus, placed on neo gtt. Admitted to ICU under triad service. Blood cultures on 5/27 positive for MRSA, ID consulted. 2D Echo revealed right-sided vegetation.   Taken for TEE on 6/1 which revealed EF 55-60% with a 8x5 mm vegetation attached to posterior leaflet, severe regurgitation. Patient arrived back to ICU, presented with shortness of breath and tachypnea. CXR revealed moderately large right-sided pneumothorax.    SIGNIFICANT EVENTS  5/26 > Presents to ED 6/1 > TEE   STUDIES:  CXR 5/26 > New bilateral nodular airspace opacities throughout the upper and lower lobes bilaterally. Findings are concerning for multifocal infection such as septic emboli. CT Chest 5/26 > Numerous cavitary consolidations throughout both lungs, with a peripheral distribution suggesting septic emboli. Largest cavitary consolidation is within the left lower lobe measuring approximately 4 cm greatest dimension CT A/P 5/30 > Bibasilar, multifocal cavitary lesions noted within the visualized lower lobes consistent with septic emboli or possibly cavitary pneumonia. More confluent airspace disease in the left lower lobe without cavitation is also present. There is a small right pleural effusion. Anasarca with moderate volume of ascites noted within the abdomen and pelvis. Question right heart dysfunction/failure. Splenomegaly. Water attenuating cysts in the lower pole the right kidney in aggregate measuring 2 x 1.3 cm. Status post cholecystectomy. CXR 6/1 >  Moderately large right-sided pneumothorax new from the prior exam. Near complete collapse of the right lower lobe is noted EVENTS 6/1 - In respiratory distress. Pain to shoulder.  S/p right chest tube for ptx sponateneopus follwing TEE   SUBJECTIVE/OVERNIGHT/INTERVAL HX 6/2 - Per [facility] hospitalist: Severe TV Endocarditis. Not a good candidate for surgery currently. Difficulty clearing bacteremia. Changed to teflaro 11/15/16  VITAL SIGNS: Temp:  [97.2 F (36.2 C)-99.5 F (37.5 C)] 97.5 F (36.4 C) (06/02 0753) Pulse Rate:  [66-124] 92 (06/02 1000) Resp:  [17-51] 27 (06/02 1000) BP: (99-145)/(64-97) 110/69 (06/02 1000) SpO2:  [92 %-100 %] 98 % (06/02 1000) Weight:  [62.8 kg (138 lb 7.2 oz)] 62.8 kg (138 lb 7.2 oz) (06/02 0352)  EXAM  General Appearance:    Looks chronically unwell. Stable tallking . Has cell phone to surf  Head:    Normocephalic, without obvious abnormality, atraumatic  Eyes:    PERRL - yes, conjunctiva/corneas - yes      Ears:    Normal external ear canals, both ears  Nose:   NG tube - no but has 3L [place] +  Throat:  ETT TUBE - no , OG tube - no  Neck:   Supple,  No enlargement/tenderness/nodules     Lungs:     Clear to auscultation bilaterally,   Chest wall:    No deformity. RT chest tube to suction + - no tidal or bubble when suction turned off  Heart:    S1 and S2 normal, no murmur, CVP - no.  Pressors - no  Abdomen:     Soft, no masses, no organomegaly  Genitalia:    Not done  Rectal:   not done  Extremities:  Extremities- no cyanosis, no clubbing,. No edema     Skin:   Intact in exposed areas .      Neurologic:   Sedation - none -> RASS - +1 . Moves all 4s - yes. CAM-ICU - neg . Orientation - x3+     Anti-infectives    Start     Dose/Rate Route Frequency Ordered Stop   11/15/16 1800  ceftaroline (TEFLARO) 600 mg in sodium chloride 0.9 % 250 mL IVPB     600 mg 250 mL/hr over 60 Minutes Intravenous Every 12 hours 11/15/16 1635     11/13/16 1215   vancomycin (VANCOCIN) IVPB 1000 mg/200 mL premix  Status:  Discontinued     1,000 mg 200 mL/hr over 60 Minutes Intravenous Every 8 hours 11/13/16 1203 11/16/16 0850   11/11/16 1100  vancomycin (VANCOCIN) IVPB 750 mg/150 ml premix  Status:  Discontinued     750 mg 150 mL/hr over 60 Minutes Intravenous Every 8 hours 11/11/16 0956 11/13/16 1203   11/11/16 0400  vancomycin (VANCOCIN) 500 mg in sodium chloride 0.9 % 100 mL IVPB  Status:  Discontinued     500 mg 100 mL/hr over 60 Minutes Intravenous Every 12 hours 11/10/16 1951 11/11/16 0956   11/10/16 2200  ceFEPIme (MAXIPIME) 1 g in dextrose 5 % 50 mL IVPB  Status:  Discontinued     1 g 100 mL/hr over 30 Minutes Intravenous Every 12 hours 11/10/16 1951 11/11/16 0926   11/10/16 2000  ceFEPIme (MAXIPIME) 2 g in dextrose 5 % 50 mL IVPB  Status:  Discontinued     2 g 100 mL/hr over 30 Minutes Intravenous  Once 11/10/16 1952 11/10/16 1954   11/10/16 2000  vancomycin (VANCOCIN) IVPB 1000 mg/200 mL premix  Status:  Discontinued     1,000 mg 200 mL/hr over 60 Minutes Intravenous  Once 11/10/16 1952 11/10/16 1954   11/10/16 1430  piperacillin-tazobactam (ZOSYN) IVPB 3.375 g     3.375 g 100 mL/hr over 30 Minutes Intravenous  Once 11/10/16 1426 11/10/16 1503   11/10/16 1430  vancomycin (VANCOCIN) IVPB 1000 mg/200 mL premix     1,000 mg 200 mL/hr over 60 Minutes Intravenous  Once 11/10/16 1426 11/10/16 1653       LABS  PULMONARY  Recent Labs Lab 11/10/16 1640 11/11/16 0415  PHART 7.426 7.442  PCO2ART 35.8 31.9*  PO2ART 66.6* 128*  HCO3 23.1 21.6  O2SAT 93.1 98.9    CBC  Recent Labs Lab 11/15/16 0622 11/16/16 0548 11/17/16 0908  HGB 8.8* 9.3* 7.9*  HCT 26.3* 27.9* 24.6*  WBC 13.0* 11.4* 8.0  PLT 132* 160 179    COAGULATION  Recent Labs Lab 11/10/16 2119  INR 1.16    CARDIAC    Recent Labs Lab 11/10/16 2119  TROPONINI 0.05*   No results for input(s): PROBNP in the last 168 hours.   CHEMISTRY  Recent  Labs Lab 11/10/16 1350  11/11/16 0306 11/12/16 0352 11/13/16 0335 11/14/16 0033 11/15/16 0622 11/16/16 0548 11/17/16 0720 11/17/16 0908  NA  --   < > 138 142  --  139 135 139  --  138  K  --   < > 3.3* 3.3*  --  4.4 4.2 3.9  --  4.2  CL  --   < > 108 113*  --  114* 104 106  --  111  CO2  --   < > 23 24  --  19* 23 26  --  23  GLUCOSE  --   < > 175* 147*  --  106* 98 94  --  122*  BUN  --   < > 24* 12  --  13 9 11   --  13  CREATININE  --   < > 0.68 0.59 0.61 0.74 0.72 0.77 0.88 0.76  CALCIUM  --   < > 7.6* 8.2*  --  7.7* 7.4* 7.6*  --  7.6*  MG 1.5*  --  2.6* 1.7 1.6*  --   --   --   --   --   PHOS  --   --  1.9* 1.5*  --   --   --   --   --   --   < > = values in this interval not displayed. Estimated Creatinine Clearance: 88.2 mL/min (by C-G formula based on SCr of 0.76 mg/dL).   LIVER  Recent Labs Lab 11/10/16 1342 11/10/16 2119 11/16/16 2040  AST 38 25  --   ALT 20 16  --   ALKPHOS 249* 164*  --   BILITOT 1.0 0.9  --   PROT 6.6 5.0* 5.8*  ALBUMIN 2.8* 2.1*  --   INR  --  1.16  --      INFECTIOUS  Recent Labs Lab 11/10/16 1733 11/10/16 2119 11/11/16 0306  LATICACIDVEN 2.11* 1.9 1.6  PROCALCITON  --  3.16  --      ENDOCRINE CBG (last 3)   Recent Labs  11/14/16 1947 11/15/16 0002 11/15/16 0748  GLUCAP 92 109* 85         IMAGING x48h  - image(s) personally visualized  -   highlighted in bold Dg Chest Port 1 View  Result Date: 11/17/2016 CLINICAL DATA:  Follow-up right-sided chest tube placement. Initial encounter. EXAM: PORTABLE CHEST 1 VIEW COMPARISON:  Chest radiograph performed 11/16/2016 FINDINGS: The patient's moderate right-sided pneumothorax has decreased in size. The right-sided chest tube is grossly unchanged in appearance. Vascular congestion is noted. Patchy left-sided airspace opacities raise concern for pneumonia. Right-sided airspace opacity may reflect atelectasis. A small left pleural effusion is noted. The cardiomediastinal  silhouette is mildly enlarged. No acute osseous abnormalities are identified. IMPRESSION: 1. Moderate right-sided pneumothorax has decreased in size. Right-sided chest tube is grossly unchanged in appearance. 2. Vascular congestion and mild cardiomegaly. Patchy left-sided airspace opacities raise concern for pneumonia. Small left pleural effusion noted. 3. Right-sided airspace opacity may reflect atelectasis. Electronically Signed   By: Roanna Raider M.D.   On: 11/17/2016 00:33   Dg Chest Port 1 View  Result Date: 11/16/2016 CLINICAL DATA:  Chest tube placement for right pneumothorax. EXAM: PORTABLE CHEST 1 VIEW COMPARISON:  Single-view of the chest earlier today and 11/11/2016. FINDINGS: Pigtail catheter is now in place in the right chest. Large right pneumothorax is unchanged. Patchy airspace disease in cavitary lesions in the left chest persist. Heart size is normal. IMPRESSION: No change in large right pneumothorax after chest tube placement. No change in patchy airspace disease in cavitary lesions on the left. These results were called by telephone at the time of interpretation on 11/16/2016 at 8:10 pm to Estrella Deeds, RN, who verbally acknowledged these results. Electronically Signed   By: Drusilla Kanner M.D.   On: 11/16/2016 20:12   Dg Chest Port 1 View  Result Date: 11/16/2016 CLINICAL DATA:  Shortness of Breath EXAM: PORTABLE CHEST 1 VIEW COMPARISON:  11/11/2016 FINDINGS: Cardiac shadow is within normal limits. Cavitary nodular densities are noted throughout both lungs  similar to that seen on previous CT examination. A new right-sided moderately large pneumothorax is noted with consolidation of the lower lobe on the right. No acute bony abnormality is noted. IMPRESSION: Moderately large right-sided pneumothorax new from the prior exam. Near complete collapse of the right lower lobe is noted. Stable cavitary lesions within both lungs. Critical Value/emergent results were called by telephone at the  time of interpretation on 11/16/2016 at 6:24 pm to Dr Leitha Bleak, who verbally acknowledged these results. Electronically Signed   By: Alcide Clever M.D.   On: 11/16/2016 18:27    Principal Problem:   MRSA bacteremia Active Problems:   IVDU (intravenous drug user)   Polysubstance (including opioids) dependence, daily use (HCC)   Septic pulmonary embolism (HCC)   Septic shock (HCC)   Encounter for central line placement   Respiratory failure (HCC)   Hypokalemia   Hypophosphatemia   Endocarditis   Thrombocytopenia (HCC)   RUQ pain   Right hip pain   Pneumothorax on right     ASSESSMENT and PLAN  Respiratory failure (HCC) Acute resp failure - needing 3L [place]. This is mild and stable  Plan o2 for puilse ox > 88% Monitor Encourage IS  Pneumothorax on right Sustained 11/16/16 - sponateneous. On 11/17/2016 re-looked at cxr 1:01 PM -> lung not fully expanded Currently chest tube on suction  Plan - revised 1:01 PM 11/17/2016  Continue suction Recheck cxr 11/18/16 If lung not fully expanded then will place larger chest tube  PCCM will round again 11/18/16 tomorrow      FAMILY  - Updates: 11/17/2016 --> mom at bedside and patient  - Inter-disciplinary family meet or Palliative Care meeting due by:  DAy 7. Current LOS is LOS 7 days  CODE STATUS    Code Status Orders        Start     Ordered   11/10/16 1952  Full code  Continuous     11/10/16 1952    Code Status History    Date Active Date Inactive Code Status Order ID Comments User Context   03/12/2016 10:00 PM 03/16/2016  6:15 PM Full Code [id number]  Eduard Clos, MD Inpatient   01/19/2016  5:02 PM 01/23/2016  5:36 PM Full Code [id number]  Sanjuana Kava, NP Inpatient   01/13/2016 12:43 AM 01/19/2016  4:04 PM Full Code [id number]  Hillary Bow, DO ED        DISPO Per triad     Dr. Kalman Shan, M.D., [facility].C.P Pulmonary and Critical Care Medicine Staff Physician [facility] [place] [facility]  [facility] Pager: [phone], If no answer or between  15:00h - 7:00h: call [phone]  11/17/2016 1:02 PM

## 2016-11-17 NOTE — Consult Note (Addendum)
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 The patient has a long history of anxiety and polysubstance abuse including chronic narcotic abuse dating back more than 10 years and intravenous heroin for the past 5 years.   The patient was hospitalized in September 2017 with tricuspid valve endocarditis complicated by septic pulmonary embolism. She was treated medically. She participated in a drug rehabilitation program after that and reportedly did reasonably well for a few months, but she started using intravenous drugs again in January of this year. Ultimately she was admitted to the hospital 11/10/2016 in septic shock.  Blood cultures grew methicillin-resistant Staphylococcus aureus.  Transthoracic and subsequent transesophageal echocardiograms demonstrate vegetations on the tricuspid valve with severe tricuspid regurgitation. CT scan of the chest demonstrates multiple small cavitary lesions in the periphery of both lungs consistent with septic pulmonary embolism. Yesterday the patient developed acute respiratory distress and ultimately was diagnosed with right spontaneous pneumothorax. A small caliber chest tube was placed by the pulmonary critical care team with partial reexpansion of the lung. Cardiothoracic surgical consultation was requested.  The patient is single and up until recently [place] She has a mother who is very supportive and at the bedside for consultation this evening. The patient has no children. She has not been working for several  years. She has gone to rehabilitation programs for substance abuse on 2 previous occasions and reportedly done reasonably well when she is taking Suboxone. However, she suffers from chronic anxiety and takes many medications including benzodiazepines. She freely admits to using intravenous heroin. At present she reports mild shortness of breath which is much improved in comparison with how she felt yesterday. She has right-sided pleuritic chest pain. Overall she states that she feels much better than she did at the time of hospital admission. White blood count was elevated at 14,500 at the time of admission and has been gradually trending down to normal. Fevers have resolved. The patient's appetite is improving and she has been eating. She has been up and ambulatory. She denies dizzy spells.  Past Medical History:  Diagnosis Date  . Anxiety   . Bacterial endocarditis    MRSA sepsis with tricuspid valve endocarditis  . Depression   . Heart murmur   . IVDU (intravenous drug user)   . Septic pulmonary embolism (HCC)   . Smoker   . Tricuspid valve regurgitation, infectious     Past Surgical History:  Procedure Laterality Date  . CHOLECYSTECTOMY      Family History  Problem Relation Age of Onset  . Tuberculosis Neg Hx   . Diabetes Mellitus II Neg Hx     Social History   Social History  . Marital status: Single    Spouse name: N/A  . Number of children: N/A  . Years of education: N/A   Occupational History  . none    Social History Main Topics  . Smoking status: Current Some Day Smoker    Packs/day: 1.00  .  Smokeless tobacco: Never Used  . Alcohol use Yes     Comment: occasinally socially  . Drug use: Yes    Types: Methamphetamines, Cocaine, IV, Heroin, Oxycodone, "Crack" cocaine     Comment: Also Herion  . Sexual activity: Yes    Birth control/ protection: Injection   Other Topics Concern  . Not on file   Social History Narrative   Single   No children   IVDA x 5  years    Prior to Admission medications   Medication Sig Start Date End Date Taking? Authorizing Provider  clonazePAM (KLONOPIN) 0.5 MG tablet Take 0.5 tablets (0.25 mg total) by mouth daily. Patient taking differently: Take 0.5 mg by mouth daily.  01/24/16  Yes Oneta Rack, NP  gabapentin (NEURONTIN) 300 MG capsule Take 1 capsule (300 mg total) by mouth 3 (three) times daily. 01/23/16  Yes Oneta Rack, NP  amoxicillin-clavulanate (AUGMENTIN) 875-125 MG tablet Take 1 tablet by mouth 2 (two) times daily. Patient not taking: Reported on 11/10/2016 03/16/16   Elgergawy, Leana Roe, MD  busPIRone (BUSPAR) 10 MG tablet Take 10 mg by mouth 2 (two) times daily. 02/15/16   [provider]  citalopram (CELEXA) 10 MG tablet Take 1 tablet (10 mg total) by mouth daily. Patient not taking: Reported on 11/10/2016 01/23/16   Oneta Rack, NP  nicotine polacrilex (NICORETTE) 2 MG gum Take 1 each (2 mg total) by mouth as needed for smoking cessation. Patient not taking: Reported on 03/12/2016 01/23/16   Oneta Rack, NP  saccharomyces boulardii (FLORASTOR) 250 MG capsule Take 1 capsule (250 mg total) by mouth 2 (two) times daily. Patient not taking: Reported on 11/10/2016 03/16/16   Elgergawy, Leana Roe, MD  SUBOXONE 8-2 MG FILM Take 2.5 each by mouth daily. 02/28/16   [provider]    Current Facility-Administered Medications  Medication Dose Route Frequency Provider Last Rate Last Dose  . 0.9 %  sodium chloride infusion  250 mL Intravenous PRN Wilber Oliphant, MD 20 mL/hr at 11/17/16 1631 250 mL at 11/17/16 1631  . acetaminophen (TYLENOL) tablet 1,000 mg  1,000 mg Oral Q6H Lo Mordecai Rasmussen, MD   1,000 mg at 11/17/16 1603  . albuterol (PROVENTIL) (2.5 MG/3ML) 0.083% nebulizer solution 2.5 mg  2.5 mg Nebulization Q2H PRN Wilber Oliphant, MD      . ALPRAZolam Prudy Feeler) tablet 0.5 mg  0.5 mg Oral TID PRN Marinda Elk, MD   0.5 mg at 11/17/16 1036  . buprenorphine-naloxone (SUBOXONE) 8-2 mg  per SL tablet 2 tablet  2 tablet Sublingual Daily Wilber Oliphant, MD   2 tablet at 11/17/16 1036  . busPIRone (BUSPAR) tablet 10 mg  10 mg Oral BID Wilber Oliphant, MD   10 mg at 11/17/16 0937  . ceftaroline (TEFLARO) 600 mg in sodium chloride 0.9 % 250 mL IVPB  600 mg Intravenous Q12H Judyann Munson, MD   Stopped at 11/17/16 1750  . citalopram (CELEXA) tablet 10 mg  10 mg Oral Daily Wilber Oliphant, MD   10 mg at 11/17/16 0936  . DAPTOmycin (CUBICIN) 500 mg in sodium chloride 0.9 % IVPB  500 mg Intravenous Q24H Judyann Munson, MD      . docusate (COLACE) 50 MG/5ML liquid 100 mg  100 mg Per Tube BID PRN Wilber Oliphant, MD      . gabapentin (NEURONTIN) capsule 300 mg  300 mg Oral TID Wilber Oliphant, MD   300 mg at 11/17/16 1603  . heparin injection 5,000  Units  5,000 Units Subcutaneous Q8H Wilber Oliphant, MD   5,000 Units at 11/17/16 1331  . ibuprofen (ADVIL,MOTRIN) tablet 400 mg  400 mg Oral Q4H PRN Calvert Cantor, MD   400 mg at 11/15/16 1701  . ibuprofen (ADVIL,MOTRIN) tablet 600 mg  600 mg Oral QID Marton Redwood Mordecai Rasmussen, MD   600 mg at 11/17/16 1331  . nicotine (NICODERM CQ - dosed in mg/24 hours) patch 14 mg  14 mg Transdermal Daily Marinda Elk, MD   14 mg at 11/17/16 1036  . nicotine polacrilex (NICORETTE) gum 2 mg  2 mg Oral PRN Schorr, Roma Kayser, NP   2 mg at 11/13/16 2316  . ondansetron (ZOFRAN) injection 4 mg  4 mg Intravenous Q6H PRN Wilber Oliphant, MD      . oxyCODONE (Oxy IR/ROXICODONE) immediate release tablet 5-10 mg  5-10 mg Oral Q4H PRN Lo Mordecai Rasmussen, MD        No Known Allergies    Review of Systems:  Per HPI Remainder non-contributory     Physical Exam:   BP 117/78 (BP Location: Left Arm)   Pulse 93   Temp 98.5 F (36.9 C) (Oral)   Resp (!) 28   Ht 5\' 3"  (1.6 m)   Wt 135 lb 12.9 oz (61.6 kg)   LMP 10/27/2016   SpO2 96%   BMI 24.06 kg/m   General:  chronically ill-appearing, breathing comfortably in NAD  HEENT:  Unremarkable   Neck:   no JVD, no bruits, no  adenopathy   Chest:   Somewhat diminished breath sounds on right, no wheezes, + scattered rhonchi   CV:   RRR, grade IV/VI holosystolic murmur best LLSB  Abdomen:  soft, non-tender, no masses  Extremities:  warm, well-perfused, pulses palpable, trace lower extremity edema  Rectal/GU  Deferred  Neuro:   Grossly non-focal and symmetrical throughout  Skin:   Clean and dry, no rashes, no breakdown   Diagnostic Tests:  Transthoracic Echocardiography  Patient:    Dellia, Donnelly MR #:       [id number] Study Date: 11/11/2016 Gender:     F Age:        2 Height:     160 cm Weight:     57.5 kg BSA:        1.6 m^2 Pt. Status: Room:       [address]    Coralyn Helling [id number]  ATTENDING    Coralyn Helling [id number]  SONOGRAPHER  Jeryl Columbia  PERFORMING   [facility] In[facility]  ORDERING     Panchal, Amar  REFERRING    Panchal, Amar  cc:  ------------------------------------------------------------------- LV EF: 55% -   60%  ------------------------------------------------------------------- Indications:      Bacteremia 790.7.  ------------------------------------------------------------------- History:   PMH:  Intravenous Drug Abuser, Polysubstance Abuse, Hepatitis C, Septic Shock.  Risk factors:  Current tobacco use.  ------------------------------------------------------------------- Study Conclusions  - Left ventricle: The cavity size was normal. Systolic function was   normal. The estimated ejection fraction was in the range of 55%   to 60%. Wall motion was normal; there were no regional wall   motion abnormalities. - Tricuspid valve: There was moderate regurgitation. - Pulmonary arteries: Systolic pressure was mildly to moderately   increased. PA peak pressure: 42 mm Hg (S). - Inferior vena cava: The vessel was normal in size. - Pericardium, extracardiac: There was no pericardial effusion.  Impressions:  - There is an echodensity attached to the posterior  leaflet of  the   tricuspid valve measuring 14 x 11 mm consistent with a   vegetation. No vegetation was seen on the aortic, mitra or   pulmonic valves.  ------------------------------------------------------------------- Study data:  Comparison was made to the study of 03/14/2016.  Study status:  Routine.  Procedure:  Transthoracic echocardiography. Image quality was adequate.          Transthoracic echocardiography.  M-mode, complete 2D, spectral Doppler, and color Doppler.  Birthdate:  Patient birthdate: 1990-11-02.  Age:  Patient is 26 yr old.  Sex:  Gender: female.    BMI: 22.5 kg/m^2.  Blood pressure:     121/88  Patient status:  Inpatient.  Study date: Study date: 11/11/2016. Study time: 12:50 PM.  Location:  Bedside.   -------------------------------------------------------------------  ------------------------------------------------------------------- Left ventricle:  The cavity size was normal. Systolic function was normal. The estimated ejection fraction was in the range of 55% to 60%. Wall motion was normal; there were no regional wall motion abnormalities.  ------------------------------------------------------------------- Aortic valve:   Trileaflet; normal thickness leaflets. Mobility was not restricted.  Doppler:  Transvalvular velocity was within the normal range. There was no stenosis. There was no regurgitation.   ------------------------------------------------------------------- Aorta:  Aortic root: The aortic root was normal in size.  ------------------------------------------------------------------- Mitral valve:   Structurally normal valve.   Mobility was not restricted.  Doppler:  Transvalvular velocity was within the normal range. There was no evidence for stenosis. There was no regurgitation.    Peak gradient (D): 3 mm Hg.  ------------------------------------------------------------------- Left atrium:  The atrium was normal in  size.  ------------------------------------------------------------------- Right ventricle:  The cavity size was normal. Wall thickness was normal. Systolic function was normal.  ------------------------------------------------------------------- Pulmonic valve:    Structurally normal valve.   Cusp separation was normal.  Doppler:  Transvalvular velocity was within the normal range. There was no evidence for stenosis. There was no regurgitation.  ------------------------------------------------------------------- Tricuspid valve:   Structurally normal valve.    Doppler: Transvalvular velocity was within the normal range. There was moderate regurgitation.  ------------------------------------------------------------------- Pulmonary artery:   The main pulmonary artery was normal-sized. Systolic pressure was mildly to moderately increased.  ------------------------------------------------------------------- Right atrium:  The atrium was normal in size.  ------------------------------------------------------------------- Pericardium:  There was no pericardial effusion.  ------------------------------------------------------------------- Systemic veins: Inferior vena cava: The vessel was normal in size.  ------------------------------------------------------------------- Measurements   Left ventricle                         Value        Reference  LV ID, ED, PLAX chordal                43    mm     43 - 52  LV ID, ES, PLAX chordal                29.2  mm     23 - 38  LV fx shortening, PLAX chordal         32    %      >=29  LV PW thickness, ED                    10.6  mm     ----------  IVS/LV PW ratio, ED                    0.77         <=1.3  LV e&', lateral  20.5  cm/s   ----------  LV E/e&', lateral                       4.4          ----------  LV e&', medial                          15.5  cm/s   ----------  LV E/e&', medial                         5.82         ----------  LV e&', average                         18    cm/s   ----------  LV E/e&', average                       5.01         ----------    Ventricular septum                     Value        Reference  IVS thickness, ED                      8.13  mm     ----------    Aorta                                  Value        Reference  Aortic root ID, ED                     26    mm     ----------    Left atrium                            Value        Reference  LA ID, A-P, ES                         29    mm     ----------  LA ID/bsa, A-P                         1.81  cm/m^2 <=2.2  LA volume, S                           28    ml     ----------  LA volume/bsa, S                       17.5  ml/m^2 ----------  LA volume, ES, 1-p A4C                 22.7  ml     ----------  LA volume/bsa, ES, 1-p A4C             14.2  ml/m^2 ----------  LA volume, ES, 1-p A2C                 32.2  ml     ----------  LA volume/bsa, ES, 1-p A2C             20.1  ml/m^2 ----------    Mitral valve                           Value        Reference  Mitral E-wave peak velocity            90.2  cm/s   ----------  Mitral deceleration time               185   ms     150 - 230  Mitral peak gradient, D                3     mm Hg  ----------    Pulmonary arteries                     Value        Reference  PA pressure, S, DP             (H)     42    mm Hg  <=30    Tricuspid valve                        Value        Reference  Tricuspid regurg peak velocity         311   cm/s   ----------  Tricuspid peak RV-RA gradient          39    mm Hg  ----------    Right atrium                           Value        Reference  RA ID, S-I, ES, A4C                    38.6  mm     34 - 49  RA area, ES, A4C                       10    cm^2   8.3 - 19.5  RA volume, ES, A/L                     21.5  ml     ----------  RA volume/bsa, ES, A/L                 13.4  ml/m^2 ----------    Systemic veins                          Value        Reference  Estimated CVP                          3     mm Hg  ----------    Right ventricle                        Value        Reference  TAPSE  22    mm     ----------  RV pressure, S, DP             (H)     42    mm Hg  <=30  RV s&', lateral, S                      16.8  cm/s   ----------  Legend: (L)  and  (H)  mark values outside specified reference range.  ------------------------------------------------------------------- Prepared and Electronically Authenticated by  Tobias Alexander, M.D. 2018-05-27T14:22:35   CT CHEST WITHOUT CONTRAST  TECHNIQUE: Multidetector CT imaging of the chest was performed following the standard protocol without IV contrast.  COMPARISON:  Chest CT angiogram dated 03/12/2016.  FINDINGS: Cardiovascular: Probable small pericardial effusion and/or pericardial thickening, difficult to characterize without IV contrast. Heart size is within normal limits. Thoracic aorta appears to be normal in caliber.  Mediastinum/Nodes: Soft tissue density material within the right upper peritracheal space, also difficult to characterize without IV contrast, presumed lymphadenopathy. Additional mild lymphadenopathy within the anterior mediastinum and aortopulmonary window region.  Lungs/Pleura: Numerous cavitary consolidations throughout both lungs, peripherally distributed suggesting septic emboli.  More confluent consolidations at each lung base, more likely atelectasis than pneumonia. Small right pleural effusion.  Upper Abdomen: Suspected splenomegaly, incompletely imaged.  Musculoskeletal: No acute or suspicious osseous finding.  IMPRESSION: 1. Numerous cavitary consolidations throughout both lungs, with a peripheral distribution suggesting septic emboli. Largest cavitary consolidation is within the left lower lobe measuring approximately 4 cm greatest dimension. 2. Probable small  pericardial effusion and/or pericardial wall thickening, difficult to delineate without IV contrast. 3. Probable mediastinal lymphadenopathy, again difficult to definitively characterize without IV contrast, alternatively confluent fluid or edema within the mediastinum. 4. Small right pleural effusion. 5. Probable splenomegaly, incompletely imaged at the lower aspects of this chest CT. 6. Right IJ line in place with tip passing through the right atrium and into the right ventricle. Recommend retracting to the cavoatrial junction for optimal radiographic positioning. These results and recommendations were called by telephone at the time of interpretation on 11/10/2016 at 7:35 pm to Dr. Katrinka Blazing , who verbally acknowledged these results.   Electronically Signed   By: Bary Richard M.D.   On: 11/10/2016 19:36   CT ABDOMEN AND PELVIS WITH CONTRAST  TECHNIQUE: Multidetector CT imaging of the abdomen and pelvis was performed using the standard protocol following bolus administration of intravenous contrast.  CONTRAST:  80mL ISOVUE-300 IOPAMIDOL (ISOVUE-300) INJECTION 61%  COMPARISON:  03/12/2016 chest CT  FINDINGS: Lower chest: Small right effusion with several bilateral cavitary opacities in both lower lobes concerning for septic emboli or cavitary pneumonia. More confluent airspace opacity in the left lower lobe is also noted. The visualized heart is normal in size. There is no pericardial effusion. No large central pulmonary embolus.  Hepatobiliary: Normal appearance of the liver without space-occupying mass or biliary dilatation. Cholecystectomy.  Pancreas: No pancreatic mass or ductal dilatation. Mild enlargement of the pancreatic head with surrounding peripancreatic fluid is noted. Correlate to exclude a pancreatitis.  Spleen: The spleen is enlarged measuring 15.7 x 5.6 x 17 cm (volume = 780 cm^3) and demonstrates a developmental cleft, partially visualized on  prior chest CT and therefore believed less likely to represent a laceration.  Adrenals/Urinary Tract: Adrenal glands are unremarkable. Kidneys are normal, without renal calculi, solid appearing lesions, or hydronephrosis. There appear to be pair of water attenuating cysts in the lower pole the right kidney  measuring up to 2 x 1.3 cm in aggregate. Bladder is unremarkable.  Stomach/Bowel: Stomach is within normal limits. Appendix appears normal. No evidence of bowel wall thickening, distention, or inflammatory changes.  Vascular/Lymphatic: No significant vascular findings are present. No enlarged abdominal or pelvic lymph nodes.  Reproductive: Uterus and bilateral adnexa are unremarkable.  Other: Moderate volume of ascites.  Anasarca.  Musculoskeletal: No acute or significant osseous findings.  IMPRESSION: 1. Bibasilar, multifocal cavitary lesions noted within the visualized lower lobes consistent with septic emboli or possibly cavitary pneumonia. More confluent airspace disease in the left lower lobe without cavitation is also present. There is a small right pleural effusion. 2. Anasarca with moderate volume of ascites noted within the abdomen and pelvis. Question right heart dysfunction/failure. 3. Splenomegaly. 4. Water attenuating cysts in the lower pole the right kidney in aggregate measuring 2 x 1.3 cm. 5. Status post cholecystectomy.   Electronically Signed   By: Tollie Eth M.D.   On: 11/14/2016 16:26   Transesophageal Echocardiography  Patient:    Maribel, Hadley MR #:       [id number] Study Date: 11/16/2016 Gender:     F Age:        25 Height:     160 cm Weight:     65 kg BSA:        1.71 m^2 Pt. Status: Room:   ATTENDING    Calvert Cantor [id number]  SONOGRAPHER  Perley Jain, RDCS  ADMITTING    [place], [place] [id number]  PERFORMING   Thurmon Fair, MD  Gean Quint  REFERRING    Marcelino Duster  cc:  ------------------------------------------------------------------- LV EF: 55% -   60%  ------------------------------------------------------------------- Indications:      Bacteremia 790.7.  ------------------------------------------------------------------- Study Conclusions  - Left ventricle: The cavity size was normal. Wall thickness was   normal. Systolic function was normal. The estimated ejection   fraction was in the range of 55% to 60%. Wall motion was normal;   there were no regional wall motion abnormalities. - Left atrium: No evidence of thrombus in the atrial cavity or   appendage. - Right ventricle: The cavity size was mildly dilated. Wall   thickness was normal. - Right atrium: No evidence of thrombus in the atrial cavity or   appendage. - Tricuspid valve: Flail motion of the posterior leaflet. There is   a 8x5 mm vegetation attached to the posterior leaflet, but most   of the &quot;mass&quot; described on transthoracic imaging appears to be   the flail segment of the valve. There was severe regurgitation   directed eccentrically and toward the free wall.  ------------------------------------------------------------------- Study data:   Study status:  Routine.  Consent:  The risks, benefits, and alternatives to the procedure were explained to the patient and informed consent was obtained.  Procedure:  The patient reported no pain pre or post test. Initial setup. The patient was brought to the laboratory. Surface ECG leads were monitored. Sedation. Conscious sedation was administered by anesthesiology staff. Transesophageal echocardiography. An adult multiplane transesophageal probe was inserted by the attending cardiologistwithout difficulty. Image quality was adequate.  Study completion:  The patient tolerated the procedure well. There were no complications.  Administered medications:   Propofol. Diagnostic transesophageal echocardiography.   2D and color Doppler.  Birthdate:  Patient birthdate: 03/01/1991.  Age:  Patient is 26 yr old.  Sex:  Gender: female.    BMI: 25.4 kg/m^2.  Blood pressure:   140/89  Patient status:  Outpatient.  Study date:  Study date: 11/16/2016. Study time: 02:23 PM.  Location:  Endoscopy.  -------------------------------------------------------------------  ------------------------------------------------------------------- Left ventricle:  The cavity size was normal. Wall thickness was normal. Systolic function was normal. The estimated ejection fraction was in the range of 55% to 60%. Wall motion was normal; there were no regional wall motion abnormalities.  ------------------------------------------------------------------- Aortic valve:   Structurally normal valve. Trileaflet; normal thickness leaflets. Cusp separation was normal.  Doppler:  There was no significant regurgitation.  ------------------------------------------------------------------- Aorta:  There was no atheroma. There was no evidence for dissection. Aortic root: The aortic root was not dilated. Ascending aorta: The ascending aorta was normal in size. Aortic arch: The aortic arch was normal in size. Descending aorta: The descending aorta was normal in size.  ------------------------------------------------------------------- Mitral valve:   Structurally normal valve.   Leaflet separation was normal.  Doppler:  There was no significant regurgitation.  ------------------------------------------------------------------- Left atrium:  The atrium was normal in size.  No evidence of thrombus in the atrial cavity or appendage. The appendage was morphologically a left appendage, multilobulated, and of normal size. Emptying velocity was normal.  ------------------------------------------------------------------- Right ventricle:  The cavity size was mildly dilated. Wall thickness was normal. Systolic function was  normal.  ------------------------------------------------------------------- Pulmonic valve:    Structurally normal valve.  ------------------------------------------------------------------- Tricuspid valve:   Structurally normal valve.   Leaflet separation was normal. Flail motion of the posterior leaflet. There is a 8x5 mm vegetation attached to the posterior leaflet, but most of the &quot;mass&quot; described on transthoracic imaging appears to be the flail segment of the valve.  Doppler:  There was severe regurgitation directed eccentrically and toward the free wall.  ------------------------------------------------------------------- Pulmonary artery:   The main pulmonary artery was normal-sized.  ------------------------------------------------------------------- Right atrium:  The atrium was normal in size.  No evidence of thrombus in the atrial cavity or appendage. The appendage was morphologically a right appendage.  ------------------------------------------------------------------- Pericardium:  There was no pericardial effusion.   ------------------------------------------------------------------- Prepared and Electronically Authenticated by  Thurmon Fair, MD 2018-06-01T16:07:56    PORTABLE CHEST 1 VIEW  COMPARISON:  Chest radiograph performed 11/16/2016  FINDINGS: The patient's moderate right-sided pneumothorax has decreased in size. The right-sided chest tube is grossly unchanged in appearance.  Vascular congestion is noted. Patchy left-sided airspace opacities raise concern for pneumonia. Right-sided airspace opacity may reflect atelectasis. A small left pleural effusion is noted.  The cardiomediastinal silhouette is mildly enlarged. No acute osseous abnormalities are identified.  IMPRESSION: 1. Moderate right-sided pneumothorax has decreased in size. Right-sided chest tube is grossly unchanged in appearance. 2. Vascular congestion and  mild cardiomegaly. Patchy left-sided airspace opacities raise concern for pneumonia. Small left pleural effusion noted. 3. Right-sided airspace opacity may reflect atelectasis.   Electronically Signed   By: Roanna Raider M.D.   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 The patient had the same problem in September 2017 and successfully cleared her infection with medical treatment only to recur due to recurrent intravenous drug abuse.  Clinically the patient appears to be responding to antibiotics with stable blood pressure, resolution of fevers, and normalization of white blood count.  She did present with septic embolization to both lungs and yesterday she suffered a right spontaneous pneumothorax presumably related to small cavitary lesions in the periphery of the lung. She remains in sinus rhythm  with no signs of heart block.  I have personally reviewed the patient's transthoracic and transesophageal echocardiograms, CT scans, and chest radiographs.  Echocardiograms reveal severe tricuspid regurgitation with large vegetation on the tricuspid valve. There are no other complicating features.  At the time of TEE question was raised regarding the possibility of loculated left pleural effusion, but there is no significant left sided effusion seen on images in the lower half of the chest on the abdominal CT scan performed 11/14/2016. Similarly, more recent chest radiographs are not suggestive of a large pleural effusion.  Incidentally, the small caliber chest tube placed yesterday for right spontaneous pneumothorax does not appear to be functional as the lung is only partly reexpanded. There is no air leak on exam.   Recommendations:  At present there are no clear indications for surgical intervention as treatment for this  patient's bacterial endocarditis.  She appears to be responding to antibiotics and slowly improving from a clinical standpoint. I would not consider her a candidate for tricuspid valve repair or replacement under these circumstances.  I recommend continued medical therapy for treatment of her bacterial infection for a minimum of 6 weeks. Given the presence of MRSA she may need a prolonged course of antibiotic therapy.  I would not consider the presence of persistent infection at 6 weeks an indication for surgical intervention unless there were significant complicating features.  With regards to the patient's spontaneous pneumothorax, she may benefit from placement of a second chest tube if follow-up x-ray tomorrow does not reveal further reexpansion of the right lung.  I discussed matters at length with the patient and her mother at the bedside this afternoon. We discussed her long-term prognosis and the fact that her only chance for long-term survival will depend upon successful treatment of her substance abuse. All of their questions have been addressed.   I spent in excess of 90 minutes during the conduct of this hospital consultation and >50% of this time involved direct face-to-face encounter for counseling and/or coordination of the patient's care.    Salvatore Decent. Cornelius Moras, MD 11/17/2016 6:09 PM

## 2016-11-17 NOTE — Progress Notes (Addendum)
PROGRESS NOTE    Savannah Palmer   ZOX:[id number]  DOB: 1991/05/07  DOA: 11/10/2016 PCP: Patient, No Pcp Per   Brief Narrative:  26 y/o female with anxiety, depression and heroin abuse who presents with weakness, cough and chest pain. Found to be in septic shock and required pressors. Cultures have grown MRSA. 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 She continues to be febrile with positive blood cultures and central line has been removed.  Awaiting negative cultures.  6/1 underwent TEE at [facility]- by the time she returned, she was short of breath and hypoxic- stat CXR revealed a right sided pneumothorax. Chest tube was placed by PCCM.   Subjective: Feeling much better since chest tube was placed. No dsypnea. Has ongoing RUQ pain but this is not severe. No fevers or chills.   Assessment & Plan:   Active Problems:  MRSA bacteremia/ Septic shock with right  endocarditis and septic emboli to lungs - initial blood cultures from 5/27 were MRSA positive  -  Unfortunately repeat cultures (1/3 sets) on 6/30 continued to be positive for MRSA and she continued to have fevers of 102 -  Vancomycin trough on 5/29 was sub-optimal - subsequent Vanc trough on 5/31 was normal at 19 however due to ongoing fevers, I spoke with Dr Daiva EvesVan Dam and we transitioned her to Teflaro on 5/31  - fevers have resolved for now - 5/27- 2-D echo - showed a tricuspid vegetation with Moderate TR- -6/1-  TEE shows a large tricuspid vegetation, severe TR with dilated RV along with a loculated left effusion- Addendum: Dr Cornelius Moraswen has reviewed the TEE and CT scan and does not feel there is a loculated fluid collection present.  - repeat cultures from 6/1 are pending- Will need PICC for minimal 6 wks of IV antibiotics once cultures clear - ID assisting with management - HIV, Hep C negative - family requested a 'syphilis test' as she was told that she was  positive for syphilis while she was in jail a couple of months ago- RPR was negative.   Right spontaneous pneumothorax- acute respiratory failure - noted to be in respiratory distress after TEE and sent from [facility] End[facility] suite directly to SDU in [place] where a stat CXR show a right pneumo - s/p chest tube on and now quite stable - 650 cc of pale yellow fluid out from CT overnight - fluid is exudative per lights criteria   NOTE: I have notified Dr Cornelius Moraswen with CT surgery twice (6/1 and again 6/2)  in regards to both the severe Tricuspid regurgitation and loculated left pleural effusion (he has reviewed images and does not see a loculated effusion). Awaiting consult. He would like her transferred to [facility] today and he will evaluate her either later today or tomorrow.   RUQ pain - obtained CT scan- no noted abnormalities in liver or gallbladder- has ascites-  - pain appears to be musculoskeletal- cont K pad  Ascites, anasarca due to right sided hear failure - as a result of severe TR - limit IVF and follow - hold off on administering diuretics in setting of sepsis and borderline BPs - follow daily weights and I and O  Splenomegaly - etiology undetermined- CT on 5/30 revealed that the spleen is enlarged measuring 15.7 x 5.6 x 17 cm (volume= 780 cm^3) - liver is unremarkable on CT-  Anemia - Hb 7.9 today- no signs  of bleeding  - check anemia panel and repeat Hb later today    Hypokalemia   Hypophosphatemia   Hypomagnesemia - repleted- follow     Thrombocytopenia   - mild- has resolved    Heroin abuse - cont Suboxone- will need transition to a drug rehab program once stable  Anxiety - cont Buspar, Neurontin, Xanax  Nicotine abuse - Nicoderm patch  DVT prophylaxis: Heparin Code Status: Full code Family Communication: mother and father at bedside Disposition Plan: transfer to [facility] Consultants:   ID Procedures:  TEE 6/1 Chest tube  6/1  Antimicrobials:  Anti-infectives    Start     Dose/Rate Route Frequency Ordered Stop   11/15/16 1800  ceftaroline (TEFLARO) 600 mg in sodium chloride 0.9 % 250 mL IVPB     600 mg 250 mL/hr over 60 Minutes Intravenous Every 12 hours 11/15/16 1635     11/13/16 1215  vancomycin (VANCOCIN) IVPB 1000 mg/200 mL premix  Status:  Discontinued     1,000 mg 200 mL/hr over 60 Minutes Intravenous Every 8 hours 11/13/16 1203 11/16/16 0850   11/11/16 1100  vancomycin (VANCOCIN) IVPB 750 mg/150 ml premix  Status:  Discontinued     750 mg 150 mL/hr over 60 Minutes Intravenous Every 8 hours 11/11/16 0956 11/13/16 1203   11/11/16 0400  vancomycin (VANCOCIN) 500 mg in sodium chloride 0.9 % 100 mL IVPB  Status:  Discontinued     500 mg 100 mL/hr over 60 Minutes Intravenous Every 12 hours 11/10/16 1951 11/11/16 0956   11/10/16 2200  ceFEPIme (MAXIPIME) 1 g in dextrose 5 % 50 mL IVPB  Status:  Discontinued     1 g 100 mL/hr over 30 Minutes Intravenous Every 12 hours 11/10/16 1951 11/11/16 0926   11/10/16 2000  ceFEPIme (MAXIPIME) 2 g in dextrose 5 % 50 mL IVPB  Status:  Discontinued     2 g 100 mL/hr over 30 Minutes Intravenous  Once 11/10/16 1952 11/10/16 1954   11/10/16 2000  vancomycin (VANCOCIN) IVPB 1000 mg/200 mL premix  Status:  Discontinued     1,000 mg 200 mL/hr over 60 Minutes Intravenous  Once 11/10/16 1952 11/10/16 1954   11/10/16 1430  piperacillin-tazobactam (ZOSYN) IVPB 3.375 g     3.375 g 100 mL/hr over 30 Minutes Intravenous  Once 11/10/16 1426 11/10/16 1503   11/10/16 1430  vancomycin (VANCOCIN) IVPB 1000 mg/200 mL premix     1,000 mg 200 mL/hr over 60 Minutes Intravenous  Once 11/10/16 1426 11/10/16 1653       Objective: Vitals:   11/17/16 0352 11/17/16 0753 11/17/16 0800 11/17/16 1000  BP:   118/74 110/69  Pulse:   85 92  Resp:   (!) 24 (!) 27  Temp:  97.5 F (36.4 C)    TempSrc:  Oral    SpO2:   100% 98%  Weight: 62.8 kg (138 lb 7.2 oz)     Height:         Intake/Output Summary (Last 24 hours) at 11/17/16 1212 Last data filed at 11/17/16 0200  Gross per 24 hour  Intake              904 ml  Output              650 ml  Net              254 ml   Filed Weights   11/14/16 0500 11/16/16 2000 11/17/16 0352  Weight: 65.3 kg (143  lb 15.4 oz) 62.8 kg (138 lb 7.2 oz) 62.8 kg (138 lb 7.2 oz)    Examination: General exam: Appears comfortable  HEENT: PERRLA, oral mucosa moist, no sclera icterus or thrush Respiratory system: decreased breath sounds in RLL- Respiratory effort normal. Cardiovascular system: S1 & S2 heard, RRR.  No murmurs  Gastrointestinal system: Abdomen soft,  tender in RUQ, nondistended. Normal bowel sound. No organomegaly Central nervous system: Alert and oriented. No focal neurological deficits. Extremities: No cyanosis, clubbing - left forearm is slightly swollen  (previous IV site) Skin: No rashes or ulcers Psychiatry:  Mood & affect appropriate.     Data Reviewed: I have personally reviewed following labs and imaging studies  CBC:  Recent Labs Lab 11/10/16 1342  11/12/16 0352 11/14/16 1110 11/15/16 0622 11/16/16 0548 11/17/16 0908  WBC 14.5*  < > 11.9* 12.7* 13.0* 11.4* 8.0  NEUTROABS 12.3*  --   --   --   --   --   --   HGB 11.2*  < > 8.4* 8.8* 8.8* 9.3* 7.9*  HCT 32.5*  < > 24.2* 26.3* 26.3* 27.9* 24.6*  MCV 82.7  < > 82.6 85.4 84.0 84.3 85.1  PLT 119*  < > 108* 123* 132* 160 179  < > = values in this interval not displayed. Basic Metabolic Panel:  Recent Labs Lab 11/10/16 1350  11/11/16 0306 11/12/16 0352 11/13/16 0335 11/14/16 0033 11/15/16 0622 11/16/16 0548 11/17/16 0720 11/17/16 0908  NA  --   < > 138 142  --  139 135 139  --  138  K  --   < > 3.3* 3.3*  --  4.4 4.2 3.9  --  4.2  CL  --   < > 108 113*  --  114* 104 106  --  111  CO2  --   < > 23 24  --  19* 23 26  --  23  GLUCOSE  --   < > 175* 147*  --  106* 98 94  --  122*  BUN  --   < > 24* 12  --  13 9 11   --  13  CREATININE  --    < > 0.68 0.59 0.61 0.74 0.72 0.77 0.88 0.76  CALCIUM  --   < > 7.6* 8.2*  --  7.7* 7.4* 7.6*  --  7.6*  MG 1.5*  --  2.6* 1.7 1.6*  --   --   --   --   --   PHOS  --   --  1.9* 1.5*  --   --   --   --   --   --   < > = values in this interval not displayed. GFR: Estimated Creatinine Clearance: 88.2 mL/min (by C-G formula based on SCr of 0.76 mg/dL). Liver Function Tests:  Recent Labs Lab 11/10/16 1342 11/10/16 2119 11/16/16 2040  AST 38 25  --   ALT 20 16  --   ALKPHOS 249* 164*  --   BILITOT 1.0 0.9  --   PROT 6.6 5.0* 5.8*  ALBUMIN 2.8* 2.1*  --    No results for input(s): LIPASE, AMYLASE in the last 168 hours. No results for input(s): AMMONIA in the last 168 hours. Coagulation Profile:  Recent Labs Lab 11/10/16 2119  INR 1.16   Cardiac Enzymes:  Recent Labs Lab 11/10/16 2119  TROPONINI 0.05*   BNP (last 3 results) No results for input(s): PROBNP in the last 8760 hours.  HbA1C: No results for input(s): HGBA1C in the last 72 hours. CBG:  Recent Labs Lab 11/14/16 1151 11/14/16 1639 11/14/16 1947 11/15/16 0002 11/15/16 0748  GLUCAP 120* 84 92 109* 85   Lipid Profile: No results for input(s): CHOL, HDL, LDLCALC, TRIG, CHOLHDL, LDLDIRECT in the last 72 hours. Thyroid Function Tests: No results for input(s): TSH, T4TOTAL, FREET4, T3FREE, THYROIDAB in the last 72 hours. Anemia Panel: No results for input(s): VITAMINB12, FOLATE, FERRITIN, TIBC, IRON, RETICCTPCT in the last 72 hours. Urine analysis:    Component Value Date/Time   COLORURINE YELLOW 11/10/2016 2132   APPEARANCEUR CLEAR 11/10/2016 2132   LABSPEC 1.006 11/10/2016 2132   PHURINE 5.0 11/10/2016 2132   GLUCOSEU NEGATIVE 11/10/2016 2132   HGBUR MODERATE (A) 11/10/2016 2132   BILIRUBINUR NEGATIVE 11/10/2016 2132   KETONESUR NEGATIVE 11/10/2016 2132   PROTEINUR NEGATIVE 11/10/2016 2132   UROBILINOGEN 0.2 08/05/2010 1821   NITRITE NEGATIVE 11/10/2016 2132   LEUKOCYTESUR SMALL (A) 11/10/2016 2132    Sepsis Labs: @LABRCNTIP (procalcitonin:4,lacticidven:4) ) Recent Results (from the past 240 hour(s))  Blood Culture (routine x 2)     Status: Abnormal   Collection Time: 11/10/16  2:45 PM  Result Value Ref Range Status   Specimen Description BLOOD LEFT HAND  Final   Special Requests IN PEDIATRIC BOTTLE Blood Culture adequate volume  Final   Culture  Setup Time   Final    GRAM POSITIVE COCCI IN CLUSTERS IN PEDIATRIC BOTTLE CRITICAL VALUE NOTED.  VALUE IS CONSISTENT WITH PREVIOUSLY REPORTED AND CALLED VALUE.    Culture (A)  Final    STAPHYLOCOCCUS AUREUS SUSCEPTIBILITIES PERFORMED ON PREVIOUS CULTURE WITHIN THE LAST 5 DAYS. Performed at [facility], [address] [place], [place] [address]    Report Status 11/13/2016 FINAL  Final  Blood Culture (routine x 2)     Status: Abnormal   Collection Time: 11/10/16  2:55 PM  Result Value Ref Range Status   Specimen Description BLOOD LEFT ARM  Final   Special Requests   Final    BOTTLES DRAWN AEROBIC AND ANAEROBIC Blood Culture adequate volume   Culture  Setup Time   Final    IN BOTH AEROBIC AND ANAEROBIC BOTTLES GRAM POSITIVE COCCI IN CLUSTERS CRITICAL RESULT CALLED TO, READ BACK BY AND VERIFIED WITH: TO JGRIMSLEY(PHARMd) BY TCLEVELAND 11/11/2016 AT 6:15AM Performed at [facility], [address] [place], [place] [address]    Culture METHICILLIN RESISTANT STAPHYLOCOCCUS AUREUS (A)  Final   Report Status 11/13/2016 FINAL  Final   Organism ID, Bacteria METHICILLIN RESISTANT STAPHYLOCOCCUS AUREUS  Final      Susceptibility   Methicillin resistant staphylococcus aureus - MIC*    CIPROFLOXACIN >=8 RESISTANT Resistant     ERYTHROMYCIN >=8 RESISTANT Resistant     GENTAMICIN <=0.5 SENSITIVE Sensitive     OXACILLIN >=4 RESISTANT Resistant     TETRACYCLINE <=1 SENSITIVE Sensitive     VANCOMYCIN 1 SENSITIVE Sensitive     TRIMETH/SULFA <=10 SENSITIVE Sensitive     CLINDAMYCIN <=0.25 SENSITIVE Sensitive     RIFAMPIN <=0.5  SENSITIVE Sensitive     Inducible Clindamycin NEGATIVE Sensitive     * METHICILLIN RESISTANT STAPHYLOCOCCUS AUREUS  Blood Culture ID Panel (Reflexed)     Status: Abnormal   Collection Time: 11/10/16  2:55 PM  Result Value Ref Range Status   Enterococcus species NOT DETECTED NOT DETECTED Final   Listeria monocytogenes NOT DETECTED NOT DETECTED Final   Staphylococcus species DETECTED (A) NOT DETECTED Final  Comment: CRITICAL RESULT CALLED TO, READ BACK BY AND VERIFIED WITH: TO JGRIMSLEY(PHARMd) BY TCLEVELAND 11/11/2016 AT 6:15AM    Staphylococcus aureus DETECTED (A) NOT DETECTED Final    Comment: Methicillin (oxacillin)-resistant Staphylococcus aureus (MRSA). MRSA is predictably resistant to beta-lactam antibiotics (except ceftaroline). Preferred therapy is vancomycin unless clinically contraindicated. Patient requires contact precautions if  hospitalized. CRITICAL RESULT CALLED TO, READ BACK BY AND VERIFIED WITH: TO JGRIMSLEY(PHARMd) BY TCLEVELAND 11/11/2016 AT 6:15AM    Methicillin resistance DETECTED (A) NOT DETECTED Final    Comment: CRITICAL RESULT CALLED TO, READ BACK BY AND VERIFIED WITH: TO JGRIMSLEY(PHARMd) BY TCLEVELAND 11/11/2016 AT 6:15AM    Streptococcus species NOT DETECTED NOT DETECTED Final   Streptococcus agalactiae NOT DETECTED NOT DETECTED Final   Streptococcus pneumoniae NOT DETECTED NOT DETECTED Final   Streptococcus pyogenes NOT DETECTED NOT DETECTED Final   Acinetobacter baumannii NOT DETECTED NOT DETECTED Final   Enterobacteriaceae species NOT DETECTED NOT DETECTED Final   Enterobacter cloacae complex NOT DETECTED NOT DETECTED Final   Escherichia coli NOT DETECTED NOT DETECTED Final   Klebsiella oxytoca NOT DETECTED NOT DETECTED Final   Klebsiella pneumoniae NOT DETECTED NOT DETECTED Final   Proteus species NOT DETECTED NOT DETECTED Final   Serratia marcescens NOT DETECTED NOT DETECTED Final   Haemophilus influenzae NOT DETECTED NOT DETECTED Final   Neisseria  meningitidis NOT DETECTED NOT DETECTED Final   Pseudomonas aeruginosa NOT DETECTED NOT DETECTED Final   Candida albicans NOT DETECTED NOT DETECTED Final   Candida glabrata NOT DETECTED NOT DETECTED Final   Candida krusei NOT DETECTED NOT DETECTED Final   Candida parapsilosis NOT DETECTED NOT DETECTED Final   Candida tropicalis NOT DETECTED NOT DETECTED Final    Comment: Performed at [facility], [address] [place], [place] [address]  Urine culture     Status: None   Collection Time: 11/10/16  9:32 PM  Result Value Ref Range Status   Specimen Description URINE, CLEAN CATCH  Final   Special Requests NONE  Final   Culture   Final    NO GROWTH Performed at [facility], [address] [place], [place] [address]    Report Status 11/12/2016 FINAL  Final  Culture, blood (x 2)     Status: Abnormal   Collection Time: 11/10/16  9:46 PM  Result Value Ref Range Status   Specimen Description BLOOD LEFT ANTECUBITAL  Final   Special Requests IN PEDIATRIC BOTTLE Blood Culture adequate volume  Final   Culture  Setup Time   Final    GRAM POSITIVE COCCI IN CLUSTERS IN PEDIATRIC BOTTLE CRITICAL VALUE NOTED.  VALUE IS CONSISTENT WITH PREVIOUSLY REPORTED AND CALLED VALUE.    Culture (A)  Final    STAPHYLOCOCCUS AUREUS SUSCEPTIBILITIES PERFORMED ON PREVIOUS CULTURE WITHIN THE LAST 5 DAYS. Performed at [facility], [address] [place], [place] [address]    Report Status 11/13/2016 FINAL  Final  Culture, blood (x 2)     Status: Abnormal   Collection Time: 11/10/16  9:46 PM  Result Value Ref Range Status   Specimen Description BLOOD LEFT HAND  Final   Special Requests IN PEDIATRIC BOTTLE Blood Culture adequate volume  Final   Culture  Setup Time   Final    GRAM POSITIVE COCCI IN CLUSTERS IN PEDIATRIC BOTTLE CRITICAL RESULT CALLED TO, READ BACK BY AND VERIFIED WITH: D WOFFORD,PHARMD AT 1353 11/11/16 BY L BENFIELD    Culture (A)  Final    STAPHYLOCOCCUS  AUREUS SUSCEPTIBILITIES PERFORMED ON PREVIOUS CULTURE WITHIN THE LAST 5 DAYS. Performed at [facility], [address] [place], [place] [address]    Report Status 11/13/2016 FINAL  Final  MRSA PCR Screening     Status: Abnormal   Collection Time: 11/10/16  9:58 PM  Result Value Ref Range Status   MRSA by PCR POSITIVE (A) NEGATIVE Final    Comment:        The GeneXpert MRSA Assay (FDA approved for NASAL specimens only), is one component of a comprehensive MRSA colonization surveillance program. It is not intended to diagnose MRSA infection nor to guide or monitor treatment for MRSA infections. RESULT CALLED TO, READ BACK BY AND VERIFIED WITH: ABBY CHAVEZ,RN [id number] @ 2357 BY J SCOTTON   Culture, blood (Routine X 2) w Reflex to ID Panel     Status: Abnormal   Collection Time: 11/11/16 10:30 AM  Result Value Ref Range Status   Specimen Description BLOOD RIGHT HAND  Final   Special Requests   Final    BOTTLES DRAWN AEROBIC AND ANAEROBIC Blood Culture adequate volume IMMUNOCOMPROMISED   Culture  Setup Time   Final    GRAM POSITIVE COCCI IN CLUSTERS IN BOTH AEROBIC AND ANAEROBIC BOTTLES CRITICAL VALUE NOTED.  VALUE IS CONSISTENT WITH PREVIOUSLY REPORTED AND CALLED VALUE.    Culture (A)  Final    STAPHYLOCOCCUS AUREUS SUSCEPTIBILITIES PERFORMED ON PREVIOUS CULTURE WITHIN THE LAST 5 DAYS. Performed at [facility], [address], [place], [place] [address]    Report Status 11/16/2016 FINAL  Final  Culture, blood (Routine X 2) w Reflex to ID Panel     Status: Abnormal   Collection Time: 11/11/16 10:34 AM  Result Value Ref Range Status   Specimen Description BLOOD BLOOD RIGHT HAND  Final   Special Requests   Final    BOTTLES DRAWN AEROBIC AND ANAEROBIC Blood Culture adequate volume   Culture  Setup Time   Final    GRAM POSITIVE COCCI IN CLUSTERS IN BOTH AEROBIC AND ANAEROBIC BOTTLES CRITICAL RESULT CALLED TO, READ BACK BY AND VERIFIED WITH: T. PICKERING PHARMD, AT  0702 11/12/16 BY D. VANHOOK    Culture (A)  Final    STAPHYLOCOCCUS AUREUS SUSCEPTIBILITIES PERFORMED ON PREVIOUS CULTURE WITHIN THE LAST 5 DAYS. Performed at [facility], [address], [place], [place] [address]    Report Status 11/14/2016 FINAL  Final  Culture, blood (Routine X 2) w Reflex to ID Panel     Status: Abnormal   Collection Time: 11/12/16  5:42 PM  Result Value Ref Range Status   Specimen Description BLOOD RIGHT ARM  Final   Special Requests IN PEDIATRIC BOTTLE Blood Culture adequate volume  Final   Culture  Setup Time   Final    GRAM POSITIVE COCCI IN CLUSTERS IN PEDIATRIC BOTTLE CRITICAL RESULT CALLED TO, READ BACK BY AND VERIFIED WITH: N. GLOGOVAC PHARMD, AT 9562 11/13/16 BY D. VANHOOK    Culture (A)  Final    STAPHYLOCOCCUS AUREUS SUSCEPTIBILITIES PERFORMED ON PREVIOUS CULTURE WITHIN THE LAST 5 DAYS. Performed at [facility], [address], [place], [place] [address]    Report Status 11/15/2016 FINAL  Final  Culture, blood (Routine X 2) w Reflex to ID Panel     Status: Abnormal   Collection Time: 11/12/16  5:43 PM  Result Value Ref Range Status   Specimen Description BLOOD RIGHT ARM  Final   Special Requests   Final  BOTTLES DRAWN AEROBIC AND ANAEROBIC Blood Culture adequate volume   Culture  Setup Time   Final    GRAM POSITIVE COCCI IN CLUSTERS ANAEROBIC BOTTLE ONLY CRITICAL VALUE NOTED.  VALUE IS CONSISTENT WITH PREVIOUSLY REPORTED AND CALLED VALUE.    Culture (A)  Final    STAPHYLOCOCCUS AUREUS SUSCEPTIBILITIES PERFORMED ON PREVIOUS CULTURE WITHIN THE LAST 5 DAYS. Performed at [facility], [address], [place], [place] [address]    Report Status 11/15/2016 FINAL  Final  Culture, blood (Routine X 2) w Reflex to ID Panel     Status: None (Preliminary result)   Collection Time: 11/13/16 10:49 AM  Result Value Ref Range Status   Specimen Description BLOOD RIGHT ANTECUBITAL  Final   Special Requests   Final    BOTTLES DRAWN  AEROBIC ONLY Blood Culture adequate volume   Culture   Final    NO GROWTH 3 DAYS Performed at [facility], [address] [place], [place] [address]    Report Status PENDING  Incomplete  Culture, blood (Routine X 2) w Reflex to ID Panel     Status: Abnormal   Collection Time: 11/13/16 10:52 AM  Result Value Ref Range Status   Specimen Description BLOOD LEFT ARM  Final   Special Requests IN PEDIATRIC BOTTLE Blood Culture adequate volume  Final   Culture  Setup Time   Final    IN PEDIATRIC BOTTLE GRAM POSITIVE COCCI IN CLUSTERS Organism ID to follow CRITICAL RESULT CALLED TO, READ BACK BY AND VERIFIED WITH: TO BGREEN(PHARMd) BY TCLEVELAND 11/14/2016 AT 5:05AM    Culture (A)  Final    STAPHYLOCOCCUS AUREUS SUSCEPTIBILITIES PERFORMED ON PREVIOUS CULTURE WITHIN THE LAST 5 DAYS. Performed at [facility], [address], [place], [place] [address]    Report Status 11/16/2016 FINAL  Final  Blood Culture ID Panel (Reflexed)     Status: Abnormal   Collection Time: 11/13/16 10:52 AM  Result Value Ref Range Status   Enterococcus species NOT DETECTED NOT DETECTED Final   Listeria monocytogenes NOT DETECTED NOT DETECTED Final   Staphylococcus species DETECTED (A) NOT DETECTED Final    Comment: CRITICAL RESULT CALLED TO, READ BACK BY AND VERIFIED WITH: TO BGREEN(PHARMd) BY TCLEVELAND 11/14/16 AT 5:05AM    Staphylococcus aureus DETECTED (A) NOT DETECTED Final    Comment: Methicillin (oxacillin)-resistant Staphylococcus aureus (MRSA). MRSA is predictably resistant to beta-lactam antibiotics (except ceftaroline). Preferred therapy is vancomycin unless clinically contraindicated. Patient requires contact precautions if  hospitalized. CRITICAL RESULT CALLED TO, READ BACK BY AND VERIFIED WITH: TO BGREEN(PHARMd) BY TCLEVELAND 11/14/16 AT 5:05AM    Methicillin resistance DETECTED (A) NOT DETECTED Final    Comment: CRITICAL RESULT CALLED TO, READ BACK BY AND VERIFIED WITH: TO  BGREEN(PHARMd) BY TCLEVELAND 11/14/16 AT 5:05AM    Streptococcus species NOT DETECTED NOT DETECTED Final   Streptococcus agalactiae NOT DETECTED NOT DETECTED Final   Streptococcus pneumoniae NOT DETECTED NOT DETECTED Final   Streptococcus pyogenes NOT DETECTED NOT DETECTED Final   Acinetobacter baumannii NOT DETECTED NOT DETECTED Final   Enterobacteriaceae species NOT DETECTED NOT DETECTED Final   Enterobacter cloacae complex NOT DETECTED NOT DETECTED Final   Escherichia coli NOT DETECTED NOT DETECTED Final   Klebsiella oxytoca NOT DETECTED NOT DETECTED Final   Klebsiella pneumoniae NOT DETECTED NOT DETECTED Final   Proteus species NOT DETECTED NOT DETECTED Final   Serratia marcescens NOT DETECTED NOT DETECTED Final   Haemophilus influenzae NOT DETECTED NOT DETECTED Final  Neisseria meningitidis NOT DETECTED NOT DETECTED Final   Pseudomonas aeruginosa NOT DETECTED NOT DETECTED Final   Candida albicans NOT DETECTED NOT DETECTED Final   Candida glabrata NOT DETECTED NOT DETECTED Final   Candida krusei NOT DETECTED NOT DETECTED Final   Candida parapsilosis NOT DETECTED NOT DETECTED Final   Candida tropicalis NOT DETECTED NOT DETECTED Final    Comment: Performed at [facility], [address], [place], [place] [address]  Culture, blood (Routine X 2) w Reflex to ID Panel     Status: None (Preliminary result)   Collection Time: 11/14/16 12:33 AM  Result Value Ref Range Status   Specimen Description BLOOD RIGHT ANTECUBITAL  Final   Special Requests   Final    BOTTLES DRAWN AEROBIC ONLY Blood Culture adequate volume   Culture   Final    NO GROWTH 2 DAYS Performed at [facility], [address], [place], [place] [address]    Report Status PENDING  Incomplete  Culture, blood (Routine X 2) w Reflex to ID Panel     Status: Abnormal   Collection Time: 11/14/16 12:33 AM  Result Value Ref Range Status   Specimen Description BLOOD LEFT HAND  Final   Special Requests IN PEDIATRIC  BOTTLE Blood Culture adequate volume  Final   Culture  Setup Time   Final    GRAM POSITIVE COCCI IN CLUSTERS IN PEDIATRIC BOTTLE CRITICAL VALUE NOTED.  VALUE IS CONSISTENT WITH PREVIOUSLY REPORTED AND CALLED VALUE.    Culture (A)  Final    STAPHYLOCOCCUS AUREUS SUSCEPTIBILITIES PERFORMED ON PREVIOUS CULTURE WITHIN THE LAST 5 DAYS. Performed at [facility], [address] [place], [place] [address]    Report Status 11/16/2016 FINAL  Final  Culture, blood (Routine X 2) w Reflex to ID Panel     Status: Abnormal   Collection Time: 11/14/16  4:10 PM  Result Value Ref Range Status   Specimen Description BLOOD RIGHT ARM  Final   Special Requests IN PEDIATRIC BOTTLE Blood Culture adequate volume  Final   Culture  Setup Time   Final    IN PEDIATRIC BOTTLE GRAM POSITIVE COCCI IN CLUSTERS CRITICAL RESULT CALLED TO, READ BACK BY AND VERIFIED WITH: PHARMD A PHAM [id number] [id number] MLM    Culture (A)  Final    STAPHYLOCOCCUS AUREUS SUSCEPTIBILITIES PERFORMED ON PREVIOUS CULTURE WITHIN THE LAST 5 DAYS. Performed at [facility], [address] [place], [place] [address]    Report Status 11/17/2016 FINAL  Final  Culture, blood (Routine X 2) w Reflex to ID Panel     Status: None (Preliminary result)   Collection Time: 11/14/16  4:10 PM  Result Value Ref Range Status   Specimen Description BLOOD RIGHT ARM  Final   Special Requests   Final    BOTTLES DRAWN AEROBIC ONLY Blood Culture adequate volume   Culture   Final    NO GROWTH 2 DAYS Performed at [facility], [address] [place], [place] [address]    Report Status PENDING  Incomplete  MRSA PCR Screening     Status: None   Collection Time: 11/16/16  5:49 PM  Result Value Ref Range Status   MRSA by PCR NEGATIVE NEGATIVE Final    Comment:        The GeneXpert MRSA Assay (FDA approved for NASAL specimens only), is one component of a comprehensive MRSA colonization surveillance program. It is not intended to diagnose  MRSA infection  nor to guide or monitor treatment for MRSA infections. DELTA CHECK NOTED   Body fluid culture (includes gram stain)     Status: None (Preliminary result)   Collection Time: 11/16/16  8:06 PM  Result Value Ref Range Status   Specimen Description PLEURAL FLUID  Final   Special Requests NONE  Final   Gram Stain   Final    FEW WBC PRESENT, PREDOMINANTLY PMN NO ORGANISMS SEEN Performed at [facility], [address], [place], [place] [address]    Culture PENDING  Incomplete   Report Status PENDING  Incomplete         Radiology Studies: Dg Chest Port 1 View  Result Date: 11/17/2016 CLINICAL DATA:  Follow-up right-sided chest tube placement. Initial encounter. EXAM: PORTABLE CHEST 1 VIEW COMPARISON:  Chest radiograph performed 11/16/2016 FINDINGS: The patient's moderate right-sided pneumothorax has decreased in size. The right-sided chest tube is grossly unchanged in appearance. Vascular congestion is noted. Patchy left-sided airspace opacities raise concern for pneumonia. Right-sided airspace opacity may reflect atelectasis. A small left pleural effusion is noted. The cardiomediastinal silhouette is mildly enlarged. No acute osseous abnormalities are identified. IMPRESSION: 1. Moderate right-sided pneumothorax has decreased in size. Right-sided chest tube is grossly unchanged in appearance. 2. Vascular congestion and mild cardiomegaly. Patchy left-sided airspace opacities raise concern for pneumonia. Small left pleural effusion noted. 3. Right-sided airspace opacity may reflect atelectasis. Electronically Signed   By: Roanna Raider M.D.   On: 11/17/2016 00:33   Dg Chest Port 1 View  Result Date: 11/16/2016 CLINICAL DATA:  Chest tube placement for right pneumothorax. EXAM: PORTABLE CHEST 1 VIEW COMPARISON:  Single-view of the chest earlier today and 11/11/2016. FINDINGS: Pigtail catheter is now in place in the right chest. Large right pneumothorax is unchanged. Patchy  airspace disease in cavitary lesions in the left chest persist. Heart size is normal. IMPRESSION: No change in large right pneumothorax after chest tube placement. No change in patchy airspace disease in cavitary lesions on the left. These results were called by telephone at the time of interpretation on 11/16/2016 at 8:10 pm to Estrella Deeds, RN, who verbally acknowledged these results. Electronically Signed   By: Drusilla Kanner M.D.   On: 11/16/2016 20:12   Dg Chest Port 1 View  Result Date: 11/16/2016 CLINICAL DATA:  Shortness of Breath EXAM: PORTABLE CHEST 1 VIEW COMPARISON:  11/11/2016 FINDINGS: Cardiac shadow is within normal limits. Cavitary nodular densities are noted throughout both lungs similar to that seen on previous CT examination. A new right-sided moderately large pneumothorax is noted with consolidation of the lower lobe on the right. No acute bony abnormality is noted. IMPRESSION: Moderately large right-sided pneumothorax new from the prior exam. Near complete collapse of the right lower lobe is noted. Stable cavitary lesions within both lungs. Critical Value/emergent results were called by telephone at the time of interpretation on 11/16/2016 at 6:24 pm to Dr Leitha Bleak, who verbally acknowledged these results. Electronically Signed   By: Alcide Clever M.D.   On: 11/16/2016 18:27      Scheduled Meds: . acetaminophen  1,000 mg Oral Q6H  . buprenorphine-naloxone  2 tablet Sublingual Daily  . busPIRone  10 mg Oral BID  . citalopram  10 mg Oral Daily  . gabapentin  300 mg Oral TID  . heparin  5,000 Units Subcutaneous Q8H  . ibuprofen  600 mg Oral QID  . nicotine  14 mg Transdermal Daily   Continuous Infusions: . sodium chloride 250 mL (11/17/16  0100)  . ceFTAROline (TEFLARO) IV Stopped (11/17/16 0735)     LOS: 7 days    Time spent in minutes: 35    Calvert Cantor, MD [facility] Pager: [email or web address] Password [email or web address] 11/17/2016, 12:12 PM

## 2016-11-17 NOTE — Progress Notes (Addendum)
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 Afebrile but likely was febrile on vanco since it was subtherapeutic  Will switch her to daptomycin 8mg /kg/day for better treatment of staph aureus endocarditis. ceftaroline data for endocarditis has not been well studies. dapto would be preferred.  Will keep ceftaroline to cover the septic pulmonary emboli treatment since dapto does not cover lung infection  Herley Bernardini B. Drue SecondSnider MD MPH [facility] [phone]

## 2016-11-17 NOTE — Assessment & Plan Note (Addendum)
Sustained 11/16/16 - sponateneous. S/p wayne cath 6/1 and 2nd large bore 11/18/16 by CVTS with resolution of right ptx  Plan  - chest tube mgmt

## 2016-11-17 NOTE — Plan of Care (Signed)
Problem: Safety: Goal: Ability to remain free from injury will improve Outcome: Progressing Bed in low position, non skid socks on, call bell and belongings in reach, mother at bedside, pt calls for assistance.

## 2016-11-18 ENCOUNTER — Encounter (HOSPITAL_COMMUNITY): Payer: Self-pay | Admitting: Cardiovascular Disease

## 2016-11-18 ENCOUNTER — Inpatient Hospital Stay (HOSPITAL_COMMUNITY): Payer: BLUE CROSS/BLUE SHIELD

## 2016-11-18 LAB — BASIC METABOLIC PANEL
ANION GAP: 8 (ref 5–15)
BUN: 14 mg/dL (ref 6–20)
CALCIUM: 7.5 mg/dL — AB (ref 8.9–10.3)
CO2: 21 mmol/L — ABNORMAL LOW (ref 22–32)
Chloride: 109 mmol/L (ref 101–111)
Creatinine, Ser: 0.99 mg/dL (ref 0.44–1.00)
GFR calc Af Amer: >60 mL/min (ref >60)
GLUCOSE: 97 mg/dL (ref 65–99)
Potassium: 4.1 mmol/L (ref 3.5–5.1)
Sodium: 138 mmol/L (ref 135–145)

## 2016-11-18 LAB — CBC
HCT: 24.3 % — ABNORMAL LOW (ref 36.0–46.0)
HEMATOCRIT: 25 % — AB (ref 36.0–46.0)
Hemoglobin: 7.7 g/dL — ABNORMAL LOW (ref 12.0–15.0)
Hemoglobin: 7.9 g/dL — ABNORMAL LOW (ref 12.0–15.0)
MCH: 27.2 pg (ref 26.0–34.0)
MCH: 27.1 pg (ref 26.0–34.0)
MCHC: 31.7 g/dL (ref 30.0–36.0)
MCHC: 31.6 g/dL (ref 30.0–36.0)
MCV: 85.9 fL (ref 78.0–100.0)
MCV: 85.9 fL (ref 78.0–100.0)
PLATELETS: 243 10*3/uL (ref 150–400)
Platelets: 309 10*3/uL (ref 150–400)
RBC: 2.83 MIL/uL — ABNORMAL LOW (ref 3.87–5.11)
RBC: 2.91 MIL/uL — ABNORMAL LOW (ref 3.87–5.11)
RDW: 15.2 % (ref 11.5–15.5)
RDW: 15.2 % (ref 11.5–15.5)
WBC: 8.8 10*3/uL (ref 4.0–10.5)
WBC: 10.7 10*3/uL — ABNORMAL HIGH (ref 4.0–10.5)

## 2016-11-18 LAB — CULTURE, BLOOD (ROUTINE X 2)
CULTURE: NO GROWTH
Special Requests: ADEQUATE

## 2016-11-18 LAB — MAGNESIUM: Magnesium: 1.7 mg/dL (ref 1.7–2.4)

## 2016-11-18 LAB — PH, BODY FLUID: pH, Body Fluid: 7.3

## 2016-11-18 LAB — PHOSPHORUS: Phosphorus: 4.9 mg/dL — ABNORMAL HIGH (ref 2.5–4.6)

## 2016-11-18 LAB — CK: CK TOTAL: 30 U/L — AB (ref 38–234)

## 2016-11-18 MED ORDER — LIDOCAINE HCL (PF) 1 % IJ SOLN
INTRAMUSCULAR | Status: AC
  Administered 2016-11-18: 12:00:00
  Filled 2016-11-18: qty 5

## 2016-11-18 MED ORDER — MAGNESIUM SULFATE 2 GM/50ML IV SOLN
2.0000 g | Freq: Once | INTRAVENOUS | Status: AC
  Administered 2016-11-18: 2 g via INTRAVENOUS
  Filled 2016-11-18: qty 50

## 2016-11-18 MED ORDER — MORPHINE SULFATE (PF) 4 MG/ML IV SOLN
4.0000 mg | Freq: Once | INTRAVENOUS | Status: AC
  Administered 2016-11-18: 4 mg via INTRAVENOUS
  Filled 2016-11-18: qty 1

## 2016-11-18 MED ORDER — MIDAZOLAM HCL 2 MG/2ML IJ SOLN
2.0000 mg | Freq: Once | INTRAMUSCULAR | Status: AC
  Administered 2016-11-18: 2 mg via INTRAVENOUS
  Filled 2016-11-18: qty 2

## 2016-11-18 NOTE — Progress Notes (Signed)
      [address]       [place] [id number]             [phone]     CARDIOTHORACIC SURGERY PROGRESS NOTE  2 Days Post-Op  S/P Procedure(s) (LRB): TRANSESOPHAGEAL ECHOCARDIOGRAM (TEE) (N/A)  Subjective: No new complaints.  Mild pain right chest.  Denies SOB  Objective: Vital signs in last 24 hours: Temp:  [97.9 F (36.6 C)-99.3 F (37.4 C)] 98.8 F (37.1 C) (06/03 0734) Pulse Rate:  [78-107] 107 (06/03 0734) Cardiac Rhythm: Sinus tachycardia (06/03 0811) Resp:  [15-29] 26 (06/03 0734) BP: (97-125)/(60-86) 125/86 (06/03 0734) SpO2:  [95 %-100 %] 95 % (06/03 0734) Weight:  [135 lb 12.9 oz (61.6 kg)] 135 lb 12.9 oz (61.6 kg) (06/02 1500)  Physical Exam:  Rhythm:   sinus  Breath sounds: Diminished on right   Heart sounds:  RRR  Incisions:  n/a  Abdomen:  Soft, non-distended, non-tender  Extremities:  Warm, well-perfused    Intake/Output from previous day: 06/02 0701 - 06/03 0700 In: 1594.8 [P.O.:360; I.V.:374.8; IV Piggyback:860] Out: 140 [Chest Tube:140] Intake/Output this shift: Total I/O In: 480 [P.O.:480] Out: -   Lab Results:  Recent Labs  11/17/16 0908 11/18/16 0214  WBC 8.0 8.8  HGB 7.9* 7.7*  HCT 24.6* 24.3*  PLT 179 243   BMET:  Recent Labs  11/17/16 0908 11/18/16 0214  NA 138 138  K 4.2 4.1  CL 111 109  CO2 23 21*  GLUCOSE 122* 97  BUN 13 14  CREATININE 0.76 0.99  CALCIUM 7.6* 7.5*    CBG (last 3)  No results for input(s): GLUCAP in the last 72 hours. PT/INR:  No results for input(s): LABPROT, INR in the last 72 hours.  CXR:  PORTABLE CHEST 1 VIEW  COMPARISON:  11/17/2016 and prior studies  FINDINGS: A moderate right hydropneumothorax is unchanged in size, but now with pleural fluid in its basilar portion.  A right thoracostomy tube is unchanged.  Scattered pulmonary opacities/ nodules and left lower lung consolidations/atelectasis again noted.  There has been no other interval  change.  IMPRESSION: Moderate right hydropneumothorax, unchanged in size but now with pleural fluid in its basilar portion. Right thoracostomy tube remains unchanged.   Electronically Signed   By: Harmon PierJeffrey  Hu M.D.   On: 11/18/2016 10:14  Assessment/Plan: S/P Procedure(s) (LRB): TRANSESOPHAGEAL ECHOCARDIOGRAM (TEE) (N/A)  Clinically stable.  However, the small bore right chest tube not in good position (likely in major fissure) and fairly large residual hydropneumothorax.  Not surprisingly, the pleural fluid culture is growing Staph aureus.  Patient needs chest tube replacement.  I have discussed the indications, risks and benefits with the patient.  All questions answered.  Her only concern regards to pain medications.  I spent in excess of 15 minutes during the conduct of this hospital encounter and >50% of this time involved direct face-to-face encounter with the patient for counseling and/or coordination of their care.   Purcell Nailslarence H Owen, MD 11/18/2016 10:40 AM

## 2016-11-18 NOTE — Progress Notes (Signed)
[facility]    Date of Admission:  11/10/2016   Total days of antibiotics 9        Day 2 dapto        Day 4 ceftaroline           ID: Savannah Palmer is a 26 y.o. 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, empyema, right sided pneumothorax, transferred to [facility] [place] to get large bore chest tube instead of pigtail Principal Problem:   Bacterial endocarditis Active Problems:   IVDU (intravenous drug user)   Polysubstance (including opioids) dependence, daily use (HCC)   Septic pulmonary embolism (HCC)   Septic shock (HCC)   Encounter for central line placement   Respiratory failure (HCC)   MRSA bacteremia   Hypokalemia   Hypophosphatemia   Endocarditis   Thrombocytopenia (HCC)   RUQ pain   Right hip pain   Pneumothorax on right   Tricuspid valve regurgitation, infectious    Subjective: Afebrile, underwent 2nd CT placement on the right chest wall to treat R-PTX. Repeat cxr per my read looks like she has resolution of the PTX. 90mL serosanginous fluid in CT collection apparatus  Medications:  . acetaminophen  1,000 mg Oral Q6H  . buprenorphine-naloxone  2 tablet Sublingual Daily  . busPIRone  10 mg Oral BID  . citalopram  10 mg Oral Daily  . gabapentin  300 mg Oral TID  . heparin  5,000 Units Subcutaneous Q8H  . ibuprofen  600 mg Oral QID  . nicotine  14 mg Transdermal Daily    Objective: Vital signs in last 24 hours: Temp:  [97.9 F (36.6 C)-99.3 F (37.4 C)] 98.4 F (36.9 C) (06/03 1140) Pulse Rate:  [78-109] 109 (06/03 1140) Resp:  [15-28] 21 (06/03 1140) BP: (111-125)/(70-87) 115/87 (06/03 1140) SpO2:  [95 %-100 %] 100 % (06/03 1140) Weight:  [135 lb 12.9 oz (61.6 kg)-142 lb 10.2 oz (64.7 kg)] 142 lb 10.2 oz (64.7 kg) (06/03 1400) Physical Exam  Constitutional:  Sleeping. appears well-developed and well-nourished. No distress.  HENT: [place]/AT, PERRLA, no scleral icterus Mouth/Throat: Oropharynx is clear and moist. No  oropharyngeal exudate.  Cardiovascular: elevated JVD. Tachy. +SEM Pulmonary/Chest: Effort normal and breath sounds normal. No respiratory distress.  has no wheezes. Decrease breathsounds on the right. Right sided chest tube Abdominal: Soft. Bowel sounds are normal.  exhibits no distension. There is no tenderness.  Skin: Skin is warm and dry. No rash noted. No erythema.   Lab Results  Recent Labs  11/17/16 0908 11/18/16 0214 11/18/16 1356  WBC 8.0 8.8 10.7*  HGB 7.9* 7.7* 7.9*  HCT 24.6* 24.3* 25.0*  NA 138 138  --   K 4.2 4.1  --   CL 111 109  --   CO2 23 21*  --   BUN 13 14  --   CREATININE 0.76 0.99  --    Liver Panel  Recent Labs  11/16/16 2040  PROT 5.8*   Sedimentation Rate No results for input(s): ESRSEDRATE in the last 72 hours. C-Reactive Protein No results for input(s): CRP in the last 72 hours.  Microbiology: 6/1 pleural fluid - + few staph aureus 5/30 bood cx - + staph aureus Studies/Results: Dg Chest 1v Repeat Same Day  Result Date: 11/18/2016 CLINICAL DATA:  Chest tube placement EXAM: CHEST - 1 VIEW SAME DAY COMPARISON:  Earlier same day FINDINGS: Large bore chest tube placed on the right along the lateral margin. Marked reduction  an amount of right pleural air, nearly completely evacuated. Patchy infiltrates persist in both lower lobes. IMPRESSION: Large bore chest tube placed. Near complete resolution of right pneumothorax. Electronically Signed   By: Paulina Fusi M.D.   On: 11/18/2016 11:40   Dg Chest Port 1 View  Result Date: 11/18/2016 CLINICAL DATA:  Followup left pneumothorax. Patient with bacterial endocarditis. EXAM: PORTABLE CHEST 1 VIEW COMPARISON:  11/17/2016 and prior studies FINDINGS: A moderate right hydropneumothorax is unchanged in size, but now with pleural fluid in its basilar portion. A right thoracostomy tube is unchanged. Scattered pulmonary opacities/ nodules and left lower lung consolidations/atelectasis again noted. There has been no  other interval change. IMPRESSION: Moderate right hydropneumothorax, unchanged in size but now with pleural fluid in its basilar portion. Right thoracostomy tube remains unchanged. Electronically Signed   By: Harmon Pier M.D.   On: 11/18/2016 10:14   Dg Chest Port 1 View  Result Date: 11/17/2016 CLINICAL DATA:  Follow-up right-sided chest tube placement. Initial encounter. EXAM: PORTABLE CHEST 1 VIEW COMPARISON:  Chest radiograph performed 11/16/2016 FINDINGS: The patient's moderate right-sided pneumothorax has decreased in size. The right-sided chest tube is grossly unchanged in appearance. Vascular congestion is noted. Patchy left-sided airspace opacities raise concern for pneumonia. Right-sided airspace opacity may reflect atelectasis. A small left pleural effusion is noted. The cardiomediastinal silhouette is mildly enlarged. No acute osseous abnormalities are identified. IMPRESSION: 1. Moderate right-sided pneumothorax has decreased in size. Right-sided chest tube is grossly unchanged in appearance. 2. Vascular congestion and mild cardiomegaly. Patchy left-sided airspace opacities raise concern for pneumonia. Small left pleural effusion noted. 3. Right-sided airspace opacity may reflect atelectasis. Electronically Signed   By: Roanna Raider M.D.   On: 11/17/2016 00:33   Dg Chest Port 1 View  Result Date: 11/16/2016 CLINICAL DATA:  Chest tube placement for right pneumothorax. EXAM: PORTABLE CHEST 1 VIEW COMPARISON:  Single-view of the chest earlier today and 11/11/2016. FINDINGS: Pigtail catheter is now in place in the right chest. Large right pneumothorax is unchanged. Patchy airspace disease in cavitary lesions in the left chest persist. Heart size is normal. IMPRESSION: No change in large right pneumothorax after chest tube placement. No change in patchy airspace disease in cavitary lesions on the left. These results were called by telephone at the time of interpretation on 11/16/2016 at 8:10 pm to Estrella Deeds, RN, who verbally acknowledged these results. Electronically Signed   By: Drusilla Kanner M.D.   On: 11/16/2016 20:12   Dg Chest Port 1 View  Result Date: 11/16/2016 CLINICAL DATA:  Shortness of Breath EXAM: PORTABLE CHEST 1 VIEW COMPARISON:  11/11/2016 FINDINGS: Cardiac shadow is within normal limits. Cavitary nodular densities are noted throughout both lungs similar to that seen on previous CT examination. A new right-sided moderately large pneumothorax is noted with consolidation of the lower lobe on the right. No acute bony abnormality is noted. IMPRESSION: Moderately large right-sided pneumothorax new from the prior exam. Near complete collapse of the right lower lobe is noted. Stable cavitary lesions within both lungs. Critical Value/emergent results were called by telephone at the time of interpretation on 11/16/2016 at 6:24 pm to Dr Leitha Bleak, who verbally acknowledged these results. Electronically Signed   By: Alcide Clever M.D.   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 Given her age, good kidney function she would need high doses of vanco. We will plan to switch her  to daptomycin 8mg /kg and keep for now. Plan to get repeat blood cx today  MRSA empyema  C/b PTX s/p chest tube drainage = getting ceftaroline. Although linezolid would cover both blood and lungs, she is on SSRI plus buspar which may place her at risk for serotonin syndrome. For now keep on ceftatroline  Drug use = currently on suboxone to help treat cravings and her underlying infection.   Depression/anxiety = continue on current regimen  Dr Luciana Axecomer to see tomorrow  Drue SecondSNIDER, [facility] Cell: [phone] Pager: [phone]  11/18/2016, 2:45 PM

## 2016-11-18 NOTE — Op Note (Addendum)
CARDIOTHORACIC SURGERY OPERATIVE NOTE  Date of Procedure:  11/18/2016  Preoperative Diagnosis: Right Pneumothorax  Postoperative Diagnosis: Same  Procedure:   Right chest tube placement  Surgeon:   Salvatore Decentlarence H. Cornelius Moraswen, MD  Anesthesia: 1% lidocaine local with intravenous sedation    DETAILS OF THE OPERATIVE PROCEDURE  Following full informed consent the patient was given midazolam 2 mg and morphine 4 mg intravenously and continuously monitored for rhythm, BP and oxygen saturation. The right chest was prepared and draped in a sterile manner. 1% lidocaine was utilized to anesthetize the skin and subcutaneous tissues. A small incision was made and a 32 French straight chest tube was placed through the incision into the pleural space. The tube was secured to the skin and connected to a closed suction collection device.  The patient's old chest tube was removed. The patient tolerated the procedure well. A portable CXR was ordered. There were no complications.    Salvatore Decentlarence H. Cornelius Moraswen, MD 11/18/2016 11:20 AM

## 2016-11-18 NOTE — Progress Notes (Signed)
Notified Dr Rito EhrlichKrishnan and Dr Cornelius Moraswen of positive plueral culture growth.

## 2016-11-18 NOTE — Progress Notes (Signed)
[facility] PROGRESS NOTE  JULIAH SCADDEN ZOX:[id number] DOB: Dec 08, 1990 DOA: 11/10/2016  PCP: Patient, No Pcp Per  Brief History/Interval Summary: 26 year old Caucasian female with a past medical history of anxiety, depression, heroin abuse, presented with weakness, cough and chest pain. Initially was in septic shock and required pressors. Blood cultures grew MRSA. 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 She was placed on IV antibiotics. She also developed a spontaneous right-sided pneumothorax. Chest tube was placed by critical care medicine. She was transferred to [facility] for opinion from cardiothoracic surgery.  Reason for Visit: Tricuspid valve endocarditis with septic emboli. Spontaneous pneumothorax.  Consultants: Pulmonary and critical care medicine. Cardiothoracic surgery. Infectious disease.  Procedures:  TEE Study Conclusions  - Left ventricle: The cavity size was normal. Wall thickness was   normal. Systolic function was normal. The estimated ejection   fraction was in the range of 55% to 60%. Wall motion was normal;   there were no regional wall motion abnormalities. - Left atrium: No evidence of thrombus in the atrial cavity or   appendage. - Right ventricle: The cavity size was mildly dilated. Wall   thickness was normal. - Right atrium: No evidence of thrombus in the atrial cavity or   appendage. - Tricuspid valve: Flail motion of the posterior leaflet. There is   a 8x5 mm vegetation attached to the posterior leaflet, but most   of the &quot;mass&quot; described on transthoracic imaging appears to be   the flail segment of the valve. There was severe regurgitation   directed eccentrically and toward the free wall.  Right chest tube placement for pneumothorax 6/1  Antibiotics: Anti-infectives    Start     Dose/Rate Route Frequency Ordered Stop   11/17/16 1600   DAPTOmycin (CUBICIN) 500 mg in sodium chloride 0.9 % IVPB     500 mg 220 mL/hr over 30 Minutes Intravenous Every 24 hours 11/17/16 1432     11/17/16 1500  ceftaroline (TEFLARO) 600 mg in sodium chloride 0.9 % 250 mL IVPB     600 mg 250 mL/hr over 60 Minutes Intravenous Every 12 hours 11/17/16 1435     11/15/16 1800  ceftaroline (TEFLARO) 600 mg in sodium chloride 0.9 % 250 mL IVPB  Status:  Discontinued     600 mg 250 mL/hr over 60 Minutes Intravenous Every 12 hours 11/15/16 1635 11/17/16 1431   11/13/16 1215  vancomycin (VANCOCIN) IVPB 1000 mg/200 mL premix  Status:  Discontinued     1,000 mg 200 mL/hr over 60 Minutes Intravenous Every 8 hours 11/13/16 1203 11/16/16 0850   11/11/16 1100  vancomycin (VANCOCIN) IVPB 750 mg/150 ml premix  Status:  Discontinued     750 mg 150 mL/hr over 60 Minutes Intravenous Every 8 hours 11/11/16 0956 11/13/16 1203   11/11/16 0400  vancomycin (VANCOCIN) 500 mg in sodium chloride 0.9 % 100 mL IVPB  Status:  Discontinued     500 mg 100 mL/hr over 60 Minutes Intravenous Every 12 hours 11/10/16 1951 11/11/16 0956   11/10/16 2200  ceFEPIme (MAXIPIME) 1 g in dextrose 5 % 50 mL IVPB  Status:  Discontinued     1 g 100 mL/hr over 30 Minutes Intravenous Every 12 hours 11/10/16 1951 11/11/16 0926   11/10/16 2000  ceFEPIme (MAXIPIME) 2 g in dextrose 5 % 50 mL IVPB  Status:  Discontinued     2 g 100 mL/hr over 30 Minutes  Intravenous  Once 11/10/16 1952 11/10/16 1954   11/10/16 2000  vancomycin (VANCOCIN) IVPB 1000 mg/200 mL premix  Status:  Discontinued     1,000 mg 200 mL/hr over 60 Minutes Intravenous  Once 11/10/16 1952 11/10/16 1954   11/10/16 1430  piperacillin-tazobactam (ZOSYN) IVPB 3.375 g     3.375 g 100 mL/hr over 30 Minutes Intravenous  Once 11/10/16 1426 11/10/16 1503   11/10/16 1430  vancomycin (VANCOCIN) IVPB 1000 mg/200 mL premix     1,000 mg 200 mL/hr over 60 Minutes Intravenous  Once 11/10/16 1426 11/10/16 1653       Subjective/Interval  History: Patient feels better this morning. Denies any pain in her right chest. Breathing has improved. Denies any nausea, vomiting. No headaches. Her mother is at the bedside.  ROS: Denies fever or chills  Objective:  Vital Signs  Vitals:   11/18/16 0300 11/18/16 0356 11/18/16 0648 11/18/16 0734  BP:  125/84 125/83 125/86  Pulse: 87 92 (!) 101 (!) 107  Resp: 20 (!) 22 20 (!) 26  Temp:  98.8 F (37.1 C) 99.3 F (37.4 C) 98.8 F (37.1 C)  TempSrc:  Oral Oral Axillary  SpO2: 100% 100% 98% 95%  Weight:      Height:        Intake/Output Summary (Last 24 hours) at 11/18/16 1005 Last data filed at 11/18/16 0900  Gross per 24 hour  Intake          2074.84 ml  Output              140 ml  Net          1934.84 ml   Filed Weights   11/16/16 2000 11/17/16 0352 11/17/16 1500  Weight: 62.8 kg (138 lb 7.2 oz) 62.8 kg (138 lb 7.2 oz) 61.6 kg (135 lb 12.9 oz)    General appearance: alert, cooperative, appears stated age and no distress Resp: Diminished air entry at the bases. Few crackles. No wheezing. No rhonchi. Chest tube noted on the right chest. Cardio: S1, S2 is normal, regular. No S3, S4. Systolic murmur appreciated over the tricuspid area. Minimal pedal edema. GI: soft, non-tender; bowel sounds normal; no masses,  no organomegaly Extremities: extremities normal, atraumatic, no cyanosis or edema Neurologic: Awake and alert. Oriented 3. No focal neurological deficits  Lab Results:  Data Reviewed: I have personally reviewed following labs and imaging studies  CBC:  Recent Labs Lab 11/14/16 1110 11/15/16 0622 11/16/16 0548 11/17/16 0908 11/18/16 0214  WBC 12.7* 13.0* 11.4* 8.0 8.8  HGB 8.8* 8.8* 9.3* 7.9* 7.7*  HCT 26.3* 26.3* 27.9* 24.6* 24.3*  MCV 85.4 84.0 84.3 85.1 85.9  PLT 123* 132* 160 179 243    Basic Metabolic Panel:  Recent Labs Lab 11/12/16 0352 11/13/16 0335 11/14/16 0033 11/15/16 0622 11/16/16 0548 11/17/16 0720 11/17/16 0908 11/18/16 0214    NA 142  --  139 135 139  --  138 138  K 3.3*  --  4.4 4.2 3.9  --  4.2 4.1  CL 113*  --  114* 104 106  --  111 109  CO2 24  --  19* 23 26  --  23 21*  GLUCOSE 147*  --  106* 98 94  --  122* 97  BUN 12  --  13 9 11   --  13 14  CREATININE 0.59 0.61 0.74 0.72 0.77 0.88 0.76 0.99  CALCIUM 8.2*  --  7.7* 7.4* 7.6*  --  7.6* 7.5*  MG 1.7 1.6*  --   --   --   --   --  1.7  PHOS 1.5*  --   --   --   --   --   --  4.9*    GFR: Estimated Creatinine Clearance: 71.2 mL/min (by C-G formula based on SCr of 0.99 mg/dL).  Liver Function Tests:  Recent Labs Lab 11/16/16 2040  PROT 5.8*    Cardiac Enzymes:  Recent Labs Lab 11/18/16 0214  CKTOTAL 30*    CBG:  Recent Labs Lab 11/14/16 1151 11/14/16 1639 11/14/16 1947 11/15/16 0002 11/15/16 0748  GLUCAP 120* 84 92 109* 85    Anemia Panel:  Recent Labs  11/17/16 1343  VITAMINB12 1,850*  FOLATE 7.8  FERRITIN 213  TIBC 165*  IRON 12*  RETICCTPCT 2.5    Recent Results (from the past 240 hour(s))  Blood Culture (routine x 2)     Status: Abnormal   Collection Time: 11/10/16  2:45 PM  Result Value Ref Range Status   Specimen Description BLOOD LEFT HAND  Final   Special Requests IN PEDIATRIC BOTTLE Blood Culture adequate volume  Final   Culture  Setup Time   Final    GRAM POSITIVE COCCI IN CLUSTERS IN PEDIATRIC BOTTLE CRITICAL VALUE NOTED.  VALUE IS CONSISTENT WITH PREVIOUSLY REPORTED AND CALLED VALUE.    Culture (A)  Final    STAPHYLOCOCCUS AUREUS SUSCEPTIBILITIES PERFORMED ON PREVIOUS CULTURE WITHIN THE LAST 5 DAYS. Performed at [facility], [address] [place], [place] [address]    Report Status 11/13/2016 FINAL  Final  Blood Culture (routine x 2)     Status: Abnormal   Collection Time: 11/10/16  2:55 PM  Result Value Ref Range Status   Specimen Description BLOOD LEFT ARM  Final   Special Requests   Final    BOTTLES DRAWN AEROBIC AND ANAEROBIC Blood Culture adequate volume   Culture  Setup Time    Final    IN BOTH AEROBIC AND ANAEROBIC BOTTLES GRAM POSITIVE COCCI IN CLUSTERS CRITICAL RESULT CALLED TO, READ BACK BY AND VERIFIED WITH: TO JGRIMSLEY(PHARMd) BY TCLEVELAND 11/11/2016 AT 6:15AM Performed at [facility], [address] [place], [place] [address]    Culture METHICILLIN RESISTANT STAPHYLOCOCCUS AUREUS (A)  Final   Report Status 11/13/2016 FINAL  Final   Organism ID, Bacteria METHICILLIN RESISTANT STAPHYLOCOCCUS AUREUS  Final      Susceptibility   Methicillin resistant staphylococcus aureus - MIC*    CIPROFLOXACIN >=8 RESISTANT Resistant     ERYTHROMYCIN >=8 RESISTANT Resistant     GENTAMICIN <=0.5 SENSITIVE Sensitive     OXACILLIN >=4 RESISTANT Resistant     TETRACYCLINE <=1 SENSITIVE Sensitive     VANCOMYCIN 1 SENSITIVE Sensitive     TRIMETH/SULFA <=10 SENSITIVE Sensitive     CLINDAMYCIN <=0.25 SENSITIVE Sensitive     RIFAMPIN <=0.5 SENSITIVE Sensitive     Inducible Clindamycin NEGATIVE Sensitive     * METHICILLIN RESISTANT STAPHYLOCOCCUS AUREUS  Blood Culture ID Panel (Reflexed)     Status: Abnormal   Collection Time: 11/10/16  2:55 PM  Result Value Ref Range Status   Enterococcus species NOT DETECTED NOT DETECTED Final   Listeria monocytogenes NOT DETECTED NOT DETECTED Final   Staphylococcus species DETECTED (A) NOT DETECTED Final    Comment: CRITICAL RESULT CALLED TO, READ BACK BY AND VERIFIED WITH: TO JGRIMSLEY(PHARMd) BY TCLEVELAND 11/11/2016 AT 6:15AM    Staphylococcus aureus DETECTED (A) NOT DETECTED Final  Comment: Methicillin (oxacillin)-resistant Staphylococcus aureus (MRSA). MRSA is predictably resistant to beta-lactam antibiotics (except ceftaroline). Preferred therapy is vancomycin unless clinically contraindicated. Patient requires contact precautions if  hospitalized. CRITICAL RESULT CALLED TO, READ BACK BY AND VERIFIED WITH: TO JGRIMSLEY(PHARMd) BY TCLEVELAND 11/11/2016 AT 6:15AM    Methicillin resistance DETECTED (A) NOT DETECTED Final     Comment: CRITICAL RESULT CALLED TO, READ BACK BY AND VERIFIED WITH: TO JGRIMSLEY(PHARMd) BY TCLEVELAND 11/11/2016 AT 6:15AM    Streptococcus species NOT DETECTED NOT DETECTED Final   Streptococcus agalactiae NOT DETECTED NOT DETECTED Final   Streptococcus pneumoniae NOT DETECTED NOT DETECTED Final   Streptococcus pyogenes NOT DETECTED NOT DETECTED Final   Acinetobacter baumannii NOT DETECTED NOT DETECTED Final   Enterobacteriaceae species NOT DETECTED NOT DETECTED Final   Enterobacter cloacae complex NOT DETECTED NOT DETECTED Final   Escherichia coli NOT DETECTED NOT DETECTED Final   Klebsiella oxytoca NOT DETECTED NOT DETECTED Final   Klebsiella pneumoniae NOT DETECTED NOT DETECTED Final   Proteus species NOT DETECTED NOT DETECTED Final   Serratia marcescens NOT DETECTED NOT DETECTED Final   Haemophilus influenzae NOT DETECTED NOT DETECTED Final   Neisseria meningitidis NOT DETECTED NOT DETECTED Final   Pseudomonas aeruginosa NOT DETECTED NOT DETECTED Final   Candida albicans NOT DETECTED NOT DETECTED Final   Candida glabrata NOT DETECTED NOT DETECTED Final   Candida krusei NOT DETECTED NOT DETECTED Final   Candida parapsilosis NOT DETECTED NOT DETECTED Final   Candida tropicalis NOT DETECTED NOT DETECTED Final    Comment: Performed at [facility], [address], [place], [place] [address]  Urine culture     Status: None   Collection Time: 11/10/16  9:32 PM  Result Value Ref Range Status   Specimen Description URINE, CLEAN CATCH  Final   Special Requests NONE  Final   Culture   Final    NO GROWTH Performed at [facility], [address], [place], [place] [address]    Report Status 11/12/2016 FINAL  Final  Culture, blood (x 2)     Status: Abnormal   Collection Time: 11/10/16  9:46 PM  Result Value Ref Range Status   Specimen Description BLOOD LEFT ANTECUBITAL  Final   Special Requests IN PEDIATRIC BOTTLE Blood Culture adequate volume  Final   Culture  Setup Time    Final    GRAM POSITIVE COCCI IN CLUSTERS IN PEDIATRIC BOTTLE CRITICAL VALUE NOTED.  VALUE IS CONSISTENT WITH PREVIOUSLY REPORTED AND CALLED VALUE.    Culture (A)  Final    STAPHYLOCOCCUS AUREUS SUSCEPTIBILITIES PERFORMED ON PREVIOUS CULTURE WITHIN THE LAST 5 DAYS. Performed at [facility], [address] [place], [place] [address]    Report Status 11/13/2016 FINAL  Final  Culture, blood (x 2)     Status: Abnormal   Collection Time: 11/10/16  9:46 PM  Result Value Ref Range Status   Specimen Description BLOOD LEFT HAND  Final   Special Requests IN PEDIATRIC BOTTLE Blood Culture adequate volume  Final   Culture  Setup Time   Final    GRAM POSITIVE COCCI IN CLUSTERS IN PEDIATRIC BOTTLE CRITICAL RESULT CALLED TO, READ BACK BY AND VERIFIED WITH: D WOFFORD,PHARMD AT 1353 11/11/16 BY L BENFIELD    Culture (A)  Final    STAPHYLOCOCCUS AUREUS SUSCEPTIBILITIES PERFORMED ON PREVIOUS CULTURE WITHIN THE LAST 5 DAYS. Performed at [facility], [address] [place], [place] [address]    Report Status 11/13/2016  FINAL  Final  MRSA PCR Screening     Status: Abnormal   Collection Time: 11/10/16  9:58 PM  Result Value Ref Range Status   MRSA by PCR POSITIVE (A) NEGATIVE Final    Comment:        The GeneXpert MRSA Assay (FDA approved for NASAL specimens only), is one component of a comprehensive MRSA colonization surveillance program. It is not intended to diagnose MRSA infection nor to guide or monitor treatment for MRSA infections. RESULT CALLED TO, READ BACK BY AND VERIFIED WITH: ABBY CHAVEZ,RN [id number] @ 2357 BY J SCOTTON   Culture, blood (Routine X 2) w Reflex to ID Panel     Status: Abnormal   Collection Time: 11/11/16 10:30 AM  Result Value Ref Range Status   Specimen Description BLOOD RIGHT HAND  Final   Special Requests   Final    BOTTLES DRAWN AEROBIC AND ANAEROBIC Blood Culture adequate volume IMMUNOCOMPROMISED   Culture  Setup Time   Final    GRAM POSITIVE  COCCI IN CLUSTERS IN BOTH AEROBIC AND ANAEROBIC BOTTLES CRITICAL VALUE NOTED.  VALUE IS CONSISTENT WITH PREVIOUSLY REPORTED AND CALLED VALUE.    Culture (A)  Final    STAPHYLOCOCCUS AUREUS SUSCEPTIBILITIES PERFORMED ON PREVIOUS CULTURE WITHIN THE LAST 5 DAYS. Performed at [facility], [address] [place], [place] [address]    Report Status 11/16/2016 FINAL  Final  Culture, blood (Routine X 2) w Reflex to ID Panel     Status: Abnormal   Collection Time: 11/11/16 10:34 AM  Result Value Ref Range Status   Specimen Description BLOOD BLOOD RIGHT HAND  Final   Special Requests   Final    BOTTLES DRAWN AEROBIC AND ANAEROBIC Blood Culture adequate volume   Culture  Setup Time   Final    GRAM POSITIVE COCCI IN CLUSTERS IN BOTH AEROBIC AND ANAEROBIC BOTTLES CRITICAL RESULT CALLED TO, READ BACK BY AND VERIFIED WITH: T. PICKERING PHARMD, AT 0702 11/12/16 BY D. VANHOOK    Culture (A)  Final    STAPHYLOCOCCUS AUREUS SUSCEPTIBILITIES PERFORMED ON PREVIOUS CULTURE WITHIN THE LAST 5 DAYS. Performed at [facility], [address] [place], [place] [address]    Report Status 11/14/2016 FINAL  Final  Culture, blood (Routine X 2) w Reflex to ID Panel     Status: Abnormal   Collection Time: 11/12/16  5:42 PM  Result Value Ref Range Status   Specimen Description BLOOD RIGHT ARM  Final   Special Requests IN PEDIATRIC BOTTLE Blood Culture adequate volume  Final   Culture  Setup Time   Final    GRAM POSITIVE COCCI IN CLUSTERS IN PEDIATRIC BOTTLE CRITICAL RESULT CALLED TO, READ BACK BY AND VERIFIED WITH: N. GLOGOVAC PHARMD, AT [id number] 11/13/16 BY D. VANHOOK    Culture (A)  Final    STAPHYLOCOCCUS AUREUS SUSCEPTIBILITIES PERFORMED ON PREVIOUS CULTURE WITHIN THE LAST 5 DAYS. Performed at [facility], [address] [place], [place] [address]    Report Status 11/15/2016 FINAL  Final  Culture, blood (Routine X 2) w Reflex to ID Panel     Status: Abnormal   Collection Time: 11/12/16   5:43 PM  Result Value Ref Range Status   Specimen Description BLOOD RIGHT ARM  Final   Special Requests   Final    BOTTLES DRAWN AEROBIC AND ANAEROBIC Blood Culture adequate volume   Culture  Setup Time   Final    GRAM POSITIVE COCCI IN CLUSTERS  ANAEROBIC BOTTLE ONLY CRITICAL VALUE NOTED.  VALUE IS CONSISTENT WITH PREVIOUSLY REPORTED AND CALLED VALUE.    Culture (A)  Final    STAPHYLOCOCCUS AUREUS SUSCEPTIBILITIES PERFORMED ON PREVIOUS CULTURE WITHIN THE LAST 5 DAYS. Performed at [facility], [address], [place], [place] [address]    Report Status 11/15/2016 FINAL  Final  Culture, blood (Routine X 2) w Reflex to ID Panel     Status: None (Preliminary result)   Collection Time: 11/13/16 10:49 AM  Result Value Ref Range Status   Specimen Description BLOOD RIGHT ANTECUBITAL  Final   Special Requests   Final    BOTTLES DRAWN AEROBIC ONLY Blood Culture adequate volume   Culture   Final    NO GROWTH 4 DAYS Performed at [facility], [address], [place], [place] [address]    Report Status PENDING  Incomplete  Culture, blood (Routine X 2) w Reflex to ID Panel     Status: Abnormal   Collection Time: 11/13/16 10:52 AM  Result Value Ref Range Status   Specimen Description BLOOD LEFT ARM  Final   Special Requests IN PEDIATRIC BOTTLE Blood Culture adequate volume  Final   Culture  Setup Time   Final    IN PEDIATRIC BOTTLE GRAM POSITIVE COCCI IN CLUSTERS Organism ID to follow CRITICAL RESULT CALLED TO, READ BACK BY AND VERIFIED WITH: TO BGREEN(PHARMd) BY TCLEVELAND 11/14/2016 AT 5:05AM    Culture (A)  Final    STAPHYLOCOCCUS AUREUS SUSCEPTIBILITIES PERFORMED ON PREVIOUS CULTURE WITHIN THE LAST 5 DAYS. Performed at [facility], [address] [place], [place] [address]    Report Status 11/16/2016 FINAL  Final  Blood Culture ID Panel (Reflexed)     Status: Abnormal   Collection Time: 11/13/16 10:52 AM  Result Value Ref Range Status   Enterococcus species NOT  DETECTED NOT DETECTED Final   Listeria monocytogenes NOT DETECTED NOT DETECTED Final   Staphylococcus species DETECTED (A) NOT DETECTED Final    Comment: CRITICAL RESULT CALLED TO, READ BACK BY AND VERIFIED WITH: TO BGREEN(PHARMd) BY TCLEVELAND 11/14/16 AT 5:05AM    Staphylococcus aureus DETECTED (A) NOT DETECTED Final    Comment: Methicillin (oxacillin)-resistant Staphylococcus aureus (MRSA). MRSA is predictably resistant to beta-lactam antibiotics (except ceftaroline). Preferred therapy is vancomycin unless clinically contraindicated. Patient requires contact precautions if  hospitalized. CRITICAL RESULT CALLED TO, READ BACK BY AND VERIFIED WITH: TO BGREEN(PHARMd) BY TCLEVELAND 11/14/16 AT 5:05AM    Methicillin resistance DETECTED (A) NOT DETECTED Final    Comment: CRITICAL RESULT CALLED TO, READ BACK BY AND VERIFIED WITH: TO BGREEN(PHARMd) BY TCLEVELAND 11/14/16 AT 5:05AM    Streptococcus species NOT DETECTED NOT DETECTED Final   Streptococcus agalactiae NOT DETECTED NOT DETECTED Final   Streptococcus pneumoniae NOT DETECTED NOT DETECTED Final   Streptococcus pyogenes NOT DETECTED NOT DETECTED Final   Acinetobacter baumannii NOT DETECTED NOT DETECTED Final   Enterobacteriaceae species NOT DETECTED NOT DETECTED Final   Enterobacter cloacae complex NOT DETECTED NOT DETECTED Final   Escherichia coli NOT DETECTED NOT DETECTED Final   Klebsiella oxytoca NOT DETECTED NOT DETECTED Final   Klebsiella pneumoniae NOT DETECTED NOT DETECTED Final   Proteus species NOT DETECTED NOT DETECTED Final   Serratia marcescens NOT DETECTED NOT DETECTED Final   Haemophilus influenzae NOT DETECTED NOT DETECTED Final   Neisseria meningitidis NOT DETECTED NOT DETECTED Final   Pseudomonas aeruginosa NOT DETECTED NOT DETECTED Final   Candida albicans NOT DETECTED NOT DETECTED Final  Candida glabrata NOT DETECTED NOT DETECTED Final   Candida krusei NOT DETECTED NOT DETECTED Final   Candida parapsilosis NOT  DETECTED NOT DETECTED Final   Candida tropicalis NOT DETECTED NOT DETECTED Final    Comment: Performed at [facility], [address] [place], [place] [address]  Culture, blood (Routine X 2) w Reflex to ID Panel     Status: None (Preliminary result)   Collection Time: 11/14/16 12:33 AM  Result Value Ref Range Status   Specimen Description BLOOD RIGHT ANTECUBITAL  Final   Special Requests   Final    BOTTLES DRAWN AEROBIC ONLY Blood Culture adequate volume   Culture   Final    NO GROWTH 3 DAYS Performed at [facility], [address] [place], [place] [address]    Report Status PENDING  Incomplete  Culture, blood (Routine X 2) w Reflex to ID Panel     Status: Abnormal   Collection Time: 11/14/16 12:33 AM  Result Value Ref Range Status   Specimen Description BLOOD LEFT HAND  Final   Special Requests IN PEDIATRIC BOTTLE Blood Culture adequate volume  Final   Culture  Setup Time   Final    GRAM POSITIVE COCCI IN CLUSTERS IN PEDIATRIC BOTTLE CRITICAL VALUE NOTED.  VALUE IS CONSISTENT WITH PREVIOUSLY REPORTED AND CALLED VALUE.    Culture (A)  Final    STAPHYLOCOCCUS AUREUS SUSCEPTIBILITIES PERFORMED ON PREVIOUS CULTURE WITHIN THE LAST 5 DAYS. Performed at [facility], [address], [place], [place] [address]    Report Status 11/16/2016 FINAL  Final  Culture, blood (Routine X 2) w Reflex to ID Panel     Status: Abnormal   Collection Time: 11/14/16  4:10 PM  Result Value Ref Range Status   Specimen Description BLOOD RIGHT ARM  Final   Special Requests IN PEDIATRIC BOTTLE Blood Culture adequate volume  Final   Culture  Setup Time   Final    IN PEDIATRIC BOTTLE GRAM POSITIVE COCCI IN CLUSTERS CRITICAL RESULT CALLED TO, READ BACK BY AND VERIFIED WITH: PHARMD A PHAM [id number] 1036AM MLM    Culture (A)  Final    STAPHYLOCOCCUS AUREUS SUSCEPTIBILITIES PERFORMED ON PREVIOUS CULTURE WITHIN THE LAST 5 DAYS. Performed at [facility], [address]  [place], [place] [address]    Report Status 11/17/2016 FINAL  Final  Culture, blood (Routine X 2) w Reflex to ID Panel     Status: None (Preliminary result)   Collection Time: 11/14/16  4:10 PM  Result Value Ref Range Status   Specimen Description BLOOD RIGHT ARM  Final   Special Requests   Final    BOTTLES DRAWN AEROBIC ONLY Blood Culture adequate volume   Culture   Final    NO GROWTH 3 DAYS Performed at [facility], [address] [place], [place] [address]    Report Status PENDING  Incomplete  MRSA PCR Screening     Status: None   Collection Time: 11/16/16  5:49 PM  Result Value Ref Range Status   MRSA by PCR NEGATIVE NEGATIVE Final    Comment:        The GeneXpert MRSA Assay (FDA approved for NASAL specimens only), is one component of a comprehensive MRSA colonization surveillance program. It is not intended to diagnose MRSA infection nor to guide or monitor treatment for MRSA infections. DELTA CHECK NOTED   Body fluid culture (includes gram stain)     Status: None (Preliminary  result)   Collection Time: 11/16/16  8:06 PM  Result Value Ref Range Status   Specimen Description PLEURAL FLUID  Final   Special Requests NONE  Final   Gram Stain   Final    FEW WBC PRESENT, PREDOMINANTLY PMN NO ORGANISMS SEEN Performed at [facility], [address] [place], [place] [address]    Culture PENDING  Incomplete   Report Status PENDING  Incomplete      Radiology Studies: Dg Chest Port 1 View  Result Date: 11/17/2016 CLINICAL DATA:  Follow-up right-sided chest tube placement. Initial encounter. EXAM: PORTABLE CHEST 1 VIEW COMPARISON:  Chest radiograph performed 11/16/2016 FINDINGS: The patient's moderate right-sided pneumothorax has decreased in size. The right-sided chest tube is grossly unchanged in appearance. Vascular congestion is noted. Patchy left-sided airspace opacities raise concern for pneumonia. Right-sided airspace opacity may reflect atelectasis. A small  left pleural effusion is noted. The cardiomediastinal silhouette is mildly enlarged. No acute osseous abnormalities are identified. IMPRESSION: 1. Moderate right-sided pneumothorax has decreased in size. Right-sided chest tube is grossly unchanged in appearance. 2. Vascular congestion and mild cardiomegaly. Patchy left-sided airspace opacities raise concern for pneumonia. Small left pleural effusion noted. 3. Right-sided airspace opacity may reflect atelectasis. Electronically Signed   By: Roanna Raider M.D.   On: 11/17/2016 00:33   Dg Chest Port 1 View  Result Date: 11/16/2016 CLINICAL DATA:  Chest tube placement for right pneumothorax. EXAM: PORTABLE CHEST 1 VIEW COMPARISON:  Single-view of the chest earlier today and 11/11/2016. FINDINGS: Pigtail catheter is now in place in the right chest. Large right pneumothorax is unchanged. Patchy airspace disease in cavitary lesions in the left chest persist. Heart size is normal. IMPRESSION: No change in large right pneumothorax after chest tube placement. No change in patchy airspace disease in cavitary lesions on the left. These results were called by telephone at the time of interpretation on 11/16/2016 at 8:10 pm to Estrella Deeds, RN, who verbally acknowledged these results. Electronically Signed   By: Drusilla Kanner M.D.   On: 11/16/2016 20:12   Dg Chest Port 1 View  Result Date: 11/16/2016 CLINICAL DATA:  Shortness of Breath EXAM: PORTABLE CHEST 1 VIEW COMPARISON:  11/11/2016 FINDINGS: Cardiac shadow is within normal limits. Cavitary nodular densities are noted throughout both lungs similar to that seen on previous CT examination. A new right-sided moderately large pneumothorax is noted with consolidation of the lower lobe on the right. No acute bony abnormality is noted. IMPRESSION: Moderately large right-sided pneumothorax new from the prior exam. Near complete collapse of the right lower lobe is noted. Stable cavitary lesions within both lungs. Critical  Value/emergent results were called by telephone at the time of interpretation on 11/16/2016 at 6:24 pm to Dr Leitha Bleak, who verbally acknowledged these results. Electronically Signed   By: Alcide Clever M.D.   On: 11/16/2016 18:27     Medications:  Scheduled: . acetaminophen  1,000 mg Oral Q6H  . buprenorphine-naloxone  2 tablet Sublingual Daily  . busPIRone  10 mg Oral BID  . citalopram  10 mg Oral Daily  . gabapentin  300 mg Oral TID  . heparin  5,000 Units Subcutaneous Q8H  . ibuprofen  600 mg Oral QID  . nicotine  14 mg Transdermal Daily   Continuous: . sodium chloride 250 mL (11/17/16 1631)  . ceFTAROline (TEFLARO) IV Stopped (11/18/16 0330)  . DAPTOmycin (CUBICIN)  IV Stopped (11/17/16 1846)   XBJ:YNWGNF chloride, albuterol, ALPRAZolam, docusate, ibuprofen, nicotine polacrilex, ondansetron (ZOFRAN)  IV, oxyCODONE  Assessment/Plan:  Principal Problem:   Bacterial endocarditis Active Problems:   IVDU (intravenous drug user)   Polysubstance (including opioids) dependence, daily use (HCC)   Septic pulmonary embolism (HCC)   Septic shock (HCC)   Encounter for central line placement   Respiratory failure (HCC)   MRSA bacteremia   Hypokalemia   Hypophosphatemia   Endocarditis   Thrombocytopenia (HCC)   RUQ pain   Right hip pain   Pneumothorax on right   Tricuspid valve regurgitation, infectious    MRSA bacteremia/Septic shock with right TV endocarditis and septic emboli to lungs - initial blood cultures from 5/27 were MRSA positive  -  Unfortunately repeat cultures (1/3 sets) on 5/30 continued to be positive for MRSA and she continued to have fevers of 102 - Patient was transitioned to Teflaro on 5/31 due to persistent fevers.  - 5/27- 2-D echo - showed a tricuspid vegetation with Moderate TR- -6/1-  TEE shows a large tricuspid vegetation, severe TR with dilated RV along with a loculated left effusion - repeat cultures from 6/1 . Continue to be positive for staph aureus.  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  -Due to persistent bacteremia patient was started on daptomycin 6/2. Ceftaroline (Teflaro) also being continued to cover lung infection. -She remains afebrile. Symptomatically he seems to be doing better. Continue management as outlined. - Seen by cardiothoracic surgery. Not a candidate for surgery at this time.  Right spontaneous pneumothorax- acute respiratory failure - noted to be in respiratory distress after TEE and sent from [facility] suite directly to SDU in [place] where a stat CXR showed a right pneumo - s/p chest tube - fluid is exudative per lights criteria  - Pulmonology is following. Chest x-ray to be repeated today. May need another tube, or a large bore chest tube. Defer to pulmonology and cardiothoracic surgery.  RUQ pain, likely musculoskeletal - obtained CT scan- no noted abnormalities in liver or gallbladder- has ascites - pain appears to be musculoskeletal- cont K pad  Ascites, anasarca due to right sided hear failure - as a result of severe TR - limit IVF and follow - hold off on administering diuretics in setting of sepsis and borderline BPs - follow daily weights and I and O. Weight appears to be stable.  Splenomegaly - etiology undetermined- CT on 5/30 revealed that the spleen is enlarged measuring 15.7 x 5.6 x 17 cm (volume= 780 cm^3) - liver is unremarkable on CT  Normocytic Anemia - Hemoglobin is low but stable. No evidence for overt bleeding - Iron was 12. Ferritin 213. B-12 1850. Folate 7.8.  Hypokalemia/Hypophosphatemia/Hypomagnesemia - repleted- follow   Thrombocytopenia   Has resolved  Heroin abuse - cont Suboxone- will need transition to a drug rehab  program once stable  Anxiety - cont Buspar, Neurontin, Xanax  Nicotine abuse - Nicoderm patch  DVT Prophylaxis: Subcutaneous heparin    Code Status: Full code  Family Communication: Discussed with the patient and her mother  Disposition Plan: Management as outlined above    LOS: 8 days   [facility]  [facility] Pager [phone] 11/18/2016, 10:05 AM  If 7PM-7AM, please contact night-coverage at [email or web address], password [facility]

## 2016-11-18 NOTE — Progress Notes (Signed)
Name: Savannah LimeVictoria L Dunavant MRN: [id number] DOB: [birth year]    ADMISSION DATE:  11/10/2016 CONSULTATION DATE:  11/11/2016  REFERRING MD :  Dr. Ethelda ChickJacubowitz   CHIEF COMPLAINT:  Endocarditis   Brief: 26 year old female with PMH of Anxiety/Depression, Polysubstance Abuse (Heronin, Cocaine)   Presents to ED on 5/26 with Dyspnea and chest pain for one week. Reports Heronin use morning of arrival. Upon arrival to ED patient hypotensive, tachycardiac - non-responsive to fluid bolus, placed on neo gtt. Admitted to ICU under [facility] service. Blood cultures on 5/27 positive for MRSA, ID consulted. 2D Echo revealed right-sided vegetation.   Taken for TEE on 6/1 which revealed EF 55-60% with a 8x5 mm vegetation attached to posterior leaflet, severe regurgitation. Patient arrived back to ICU, presented with shortness of breath and tachypnea. CXR revealed moderately large right-sided pneumothorax.    SIGNIFICANT EVENTS  5/26 > Presents to ED 6/1 > TEE   STUDIES:  CXR 5/26 > New bilateral nodular airspace opacities throughout the upper and lower lobes bilaterally. Findings are concerning for multifocal infection such as septic emboli. CT Chest 5/26 > Numerous cavitary consolidations throughout both lungs, with a peripheral distribution suggesting septic emboli. Largest cavitary consolidation is within the left lower lobe measuring approximately 4 cm greatest dimension CT A/P 5/30 > Bibasilar, multifocal cavitary lesions noted within the visualized lower lobes consistent with septic emboli or possibly cavitary pneumonia. More confluent airspace disease in the left lower lobe without cavitation is also present. There is a small right pleural effusion. Anasarca with moderate volume of ascites noted within the abdomen and pelvis. Question right heart dysfunction/failure. Splenomegaly. Water attenuating cysts in the lower pole the right kidney in aggregate measuring 2 x 1.3 cm. Status post cholecystectomy. CXR 6/1 >  Moderately large right-sided pneumothorax new from the prior exam. Near complete collapse of the right lower lobe is noted EVENTS 6/1 - In respiratory distress. Pain to shoulder.  S/p right chest tube for ptx sponateneopus follwing TEE    6/2 - Per [facility]: Severe TV Endocarditis. Not a good candidate for surgery currently. Difficulty clearing bacteremia. Changed to teflaro 11/15/16 . sTarted cubicin 6/2   SUBJECTIVE/OVERNIGHT/INTERVAL HX  6/3- s/p 2nd chest tube by CVTS and says feeling better  Now at [place]. Fever improved/resolvd afte teflaro  VITAL SIGNS: Temp:  [97.9 F (36.6 C)-99.3 F (37.4 C)] 98.4 F (36.9 C) (06/03 1140) Pulse Rate:  [78-109] 109 (06/03 1140) Resp:  [15-28] 21 (06/03 1140) BP: (111-125)/(70-87) 115/87 (06/03 1140) SpO2:  [95 %-100 %] 100 % (06/03 1140) Weight:  [61.6 kg (135 lb 12.9 oz)] 61.6 kg (135 lb 12.9 oz) (06/02 1500)  EXAM  General Appearance:    Looks better  Head:    Normocephalic, without obvious abnormality, atraumatic  Eyes:    PERRL - yes, conjunctiva/corneas - clea      Ears:    Normal external ear canals, both ears  Nose:   NG tube - no . Williamson +  Throat:  ETT TUBE - no , OG tube - no  Neck:   Supple,  No enlargement/tenderness/nodules     Lungs:     Clear to auscultation bilaterally overal but occ crackles. Mild tachypnea+  Chest wall:    No deformity  Heart:    S1 and S2 normal, no murmur, CVP - no.  Pressors - no  Abdomen:     Soft, no masses, no organomegaly  Genitalia:    Not done  Rectal:  not done  Extremities:   Extremities- no     Skin:   Intact in exposed areas .      Neurologic:   Sedation - none -> RASS - +1 . Moves all 4s - yes. CAM-ICU - neg . Orientation - x3+       LABS  PULMONARY No results for input(s): PHART, PCO2ART, PO2ART, HCO3, TCO2, O2SAT in the last 168 hours.  Invalid input(s): PCO2, PO2  CBC  Recent Labs Lab 11/16/16 0548 11/17/16 0908 11/18/16 0214  HGB 9.3* 7.9* 7.7*    HCT 27.9* 24.6* 24.3*  WBC 11.4* 8.0 8.8  PLT 160 179 243    COAGULATION No results for input(s): INR in the last 168 hours.  CARDIAC  No results for input(s): TROPONINI in the last 168 hours. No results for input(s): PROBNP in the last 168 hours.   CHEMISTRY  Recent Labs Lab 11/12/16 1610 11/13/16 0335 11/14/16 0033 11/15/16 0622 11/16/16 0548 11/17/16 0720 11/17/16 0908 11/18/16 0214  NA 142  --  139 135 139  --  138 138  K 3.3*  --  4.4 4.2 3.9  --  4.2 4.1  CL 113*  --  114* 104 106  --  111 109  CO2 24  --  19* 23 26  --  23 21*  GLUCOSE 147*  --  106* 98 94  --  122* 97  BUN 12  --  13 9 11   --  13 14  CREATININE 0.59 0.61 0.74 0.72 0.77 0.88 0.76 0.99  CALCIUM 8.2*  --  7.7* 7.4* 7.6*  --  7.6* 7.5*  MG 1.7 1.6*  --   --   --   --   --  1.7  PHOS 1.5*  --   --   --   --   --   --  4.9*   Estimated Creatinine Clearance: 71.2 mL/min (by C-G formula based on SCr of 0.99 mg/dL).   LIVER  Recent Labs Lab 11/16/16 2040  PROT 5.8*     INFECTIOUS No results for input(s): LATICACIDVEN, PROCALCITON in the last 168 hours.   ENDOCRINE CBG (last 3)  No results for input(s): GLUCAP in the last 72 hours.       IMAGING x48h  - image(s) personally visualized  -   highlighted in bold Dg Chest 1v Repeat Same Day  Result Date: 11/18/2016 CLINICAL DATA:  Chest tube placement EXAM: CHEST - 1 VIEW SAME DAY COMPARISON:  Earlier same day FINDINGS: Large bore chest tube placed on the right along the lateral margin. Marked reduction an amount of right pleural air, nearly completely evacuated. Patchy infiltrates persist in both lower lobes. IMPRESSION: Large bore chest tube placed. Near complete resolution of right pneumothorax. Electronically Signed   By: Paulina Fusi M.D.   On: 11/18/2016 11:40   Dg Chest Port 1 View  Result Date: 11/18/2016 CLINICAL DATA:  Followup left pneumothorax. Patient with bacterial endocarditis. EXAM: PORTABLE CHEST 1 VIEW COMPARISON:   11/17/2016 and prior studies FINDINGS: A moderate right hydropneumothorax is unchanged in size, but now with pleural fluid in its basilar portion. A right thoracostomy tube is unchanged. Scattered pulmonary opacities/ nodules and left lower lung consolidations/atelectasis again noted. There has been no other interval change. IMPRESSION: Moderate right hydropneumothorax, unchanged in size but now with pleural fluid in its basilar portion. Right thoracostomy tube remains unchanged. Electronically Signed   By: Harmon Pier M.D.   On: 11/18/2016 10:14   Dg  Chest Port 1 View  Result Date: 11/17/2016 CLINICAL DATA:  Follow-up right-sided chest tube placement. Initial encounter. EXAM: PORTABLE CHEST 1 VIEW COMPARISON:  Chest radiograph performed 11/16/2016 FINDINGS: The patient's moderate right-sided pneumothorax has decreased in size. The right-sided chest tube is grossly unchanged in appearance. Vascular congestion is noted. Patchy left-sided airspace opacities raise concern for pneumonia. Right-sided airspace opacity may reflect atelectasis. A small left pleural effusion is noted. The cardiomediastinal silhouette is mildly enlarged. No acute osseous abnormalities are identified. IMPRESSION: 1. Moderate right-sided pneumothorax has decreased in size. Right-sided chest tube is grossly unchanged in appearance. 2. Vascular congestion and mild cardiomegaly. Patchy left-sided airspace opacities raise concern for pneumonia. Small left pleural effusion noted. 3. Right-sided airspace opacity may reflect atelectasis. Electronically Signed   By: Roanna Raider M.D.   On: 11/17/2016 00:33   Dg Chest Port 1 View  Result Date: 11/16/2016 CLINICAL DATA:  Chest tube placement for right pneumothorax. EXAM: PORTABLE CHEST 1 VIEW COMPARISON:  Single-view of the chest earlier today and 11/11/2016. FINDINGS: Pigtail catheter is now in place in the right chest. Large right pneumothorax is unchanged. Patchy airspace disease in cavitary  lesions in the left chest persist. Heart size is normal. IMPRESSION: No change in large right pneumothorax after chest tube placement. No change in patchy airspace disease in cavitary lesions on the left. These results were called by telephone at the time of interpretation on 11/16/2016 at 8:10 pm to Estrella Deeds, RN, who verbally acknowledged these results. Electronically Signed   By: Drusilla Kanner M.D.   On: 11/16/2016 20:12   Dg Chest Port 1 View  Result Date: 11/16/2016 CLINICAL DATA:  Shortness of Breath EXAM: PORTABLE CHEST 1 VIEW COMPARISON:  11/11/2016 FINDINGS: Cardiac shadow is within normal limits. Cavitary nodular densities are noted throughout both lungs similar to that seen on previous CT examination. A new right-sided moderately large pneumothorax is noted with consolidation of the lower lobe on the right. No acute bony abnormality is noted. IMPRESSION: Moderately large right-sided pneumothorax new from the prior exam. Near complete collapse of the right lower lobe is noted. Stable cavitary lesions within both lungs. Critical Value/emergent results were called by telephone at the time of interpretation on 11/16/2016 at 6:24 pm to Dr Leitha Bleak, who verbally acknowledged these results. Electronically Signed   By: Alcide Clever M.D.   On: 11/16/2016 18:27       ASSESSMENT and PLAN  Respiratory failure (HCC) Acute resp failure - needing 3L [place]. This is mild and stable Improving 11/18/2016   Plan o2 for puilse ox > 88% Monitor Encourage IS  Pneumothorax on right Sustained 11/16/16 - sponateneous. S/p wayne cath 6/1 and 2nd large bore 11/18/16 by CVTS with resolution of right ptx  Plan  - chest tube mgmt       FAMILY  - Updates: 11/18/2016 --> patient updated  - Inter-disciplinary family meet or Palliative Care meeting due by:  DAy 7. Current LOS is LOS 8 days  CODE STATUS    Code Status Orders        Start     Ordered   11/10/16 1952  Full code  Continuous     11/10/16  1952    Code Status History    Date Active Date Inactive Code Status Order ID Comments User Context   03/12/2016 10:00 PM 03/16/2016  6:15 PM Full Code [id number]  Eduard Clos, MD Inpatient   01/19/2016  5:02 PM 01/23/2016  5:36 PM Full Code [id number]  Armandina Stammer I, NP Inpatient   01/13/2016 12:43 AM 01/19/2016  4:04 PM Full Code [id number]  Hillary Bow, DO ED        DISPO Per [facility]     Dr. Kalman Shan, M.D., [facility]C.P Pulmonary and Critical Care Medicine Staff Physician [facility] [place] [facility] Pager: [phone], If no answer or between  15:00h - 7:00h: call [phone]  11/18/2016 2:13 PM

## 2016-11-19 ENCOUNTER — Inpatient Hospital Stay (HOSPITAL_COMMUNITY): Payer: BLUE CROSS/BLUE SHIELD

## 2016-11-19 DIAGNOSIS — B9562 Methicillin resistant Staphylococcus aureus infection as the cause of diseases classified elsewhere: Secondary | ICD-10-CM

## 2016-11-19 DIAGNOSIS — I330 Acute and subacute infective endocarditis: Secondary | ICD-10-CM

## 2016-11-19 DIAGNOSIS — J869 Pyothorax without fistula: Secondary | ICD-10-CM

## 2016-11-19 DIAGNOSIS — J984 Other disorders of lung: Secondary | ICD-10-CM

## 2016-11-19 DIAGNOSIS — F418 Other specified anxiety disorders: Secondary | ICD-10-CM

## 2016-11-19 LAB — CBC
HEMATOCRIT: 24.8 % — AB (ref 36.0–46.0)
Hemoglobin: 7.7 g/dL — ABNORMAL LOW (ref 12.0–15.0)
MCH: 26.9 pg (ref 26.0–34.0)
MCHC: 31 g/dL (ref 30.0–36.0)
MCV: 86.7 fL (ref 78.0–100.0)
PLATELETS: 335 10*3/uL (ref 150–400)
RBC: 2.86 MIL/uL — ABNORMAL LOW (ref 3.87–5.11)
RDW: 15.5 % (ref 11.5–15.5)
WBC: 8.7 10*3/uL (ref 4.0–10.5)

## 2016-11-19 LAB — PHOSPHORUS: Phosphorus: 6 mg/dL — ABNORMAL HIGH (ref 2.5–4.6)

## 2016-11-19 LAB — BASIC METABOLIC PANEL
ANION GAP: 7 (ref 5–15)
Anion gap: 7 (ref 5–15)
BUN: 15 mg/dL (ref 6–20)
BUN: 14 mg/dL (ref 6–20)
CALCIUM: 7.9 mg/dL — AB (ref 8.9–10.3)
CHLORIDE: 110 mmol/L (ref 101–111)
CO2: 23 mmol/L (ref 22–32)
CO2: 18 mmol/L — AB (ref 22–32)
Calcium: 7.7 mg/dL — ABNORMAL LOW (ref 8.9–10.3)
Chloride: 109 mmol/L (ref 101–111)
Creatinine, Ser: 1.37 mg/dL — ABNORMAL HIGH (ref 0.44–1.00)
Creatinine, Ser: 1.08 mg/dL — ABNORMAL HIGH (ref 0.44–1.00)
GFR calc Af Amer: >60 mL/min (ref >60)
GFR calc non Af Amer: >60 mL/min (ref >60)
GFR, EST NON AFRICAN AMERICAN: 53 mL/min — AB (ref >60)
GLUCOSE: 97 mg/dL (ref 65–99)
Glucose, Bld: 85 mg/dL (ref 65–99)
POTASSIUM: 5.5 mmol/L — AB (ref 3.5–5.1)
Potassium: 3.9 mmol/L (ref 3.5–5.1)
SODIUM: 135 mmol/L (ref 135–145)
Sodium: 139 mmol/L (ref 135–145)

## 2016-11-19 LAB — MAGNESIUM: Magnesium: 2.2 mg/dL (ref 1.7–2.4)

## 2016-11-19 LAB — CULTURE, BLOOD (ROUTINE X 2)
CULTURE: NO GROWTH
CULTURE: NO GROWTH
SPECIAL REQUESTS: ADEQUATE
Special Requests: ADEQUATE

## 2016-11-19 LAB — GLUCOSE, CAPILLARY: GLUCOSE-CAPILLARY: 83 mg/dL (ref 65–99)

## 2016-11-19 LAB — PATHOLOGIST SMEAR REVIEW

## 2016-11-19 LAB — POTASSIUM: POTASSIUM: 3.9 mmol/L (ref 3.5–5.1)

## 2016-11-19 MED ORDER — SACCHAROMYCES BOULARDII 250 MG PO CAPS
250.0000 mg | ORAL_CAPSULE | Freq: Two times a day (BID) | ORAL | Status: DC
  Administered 2016-11-19 – 2016-12-30 (×76): 250 mg via ORAL
  Filled 2016-11-19 (×81): qty 1

## 2016-11-19 MED ORDER — SODIUM CHLORIDE 0.9 % IV BOLUS (SEPSIS)
500.0000 mL | Freq: Once | INTRAVENOUS | Status: AC
  Administered 2016-11-19: 500 mL via INTRAVENOUS

## 2016-11-19 NOTE — Progress Notes (Signed)
[facility]   Reason for visit: Follow up on endocarditis  Interval History: chest tube placed yesterday for pneumothorax; afebrile, some pain in chest at tube site.  Total antibiotics days: 10 Day 3 daptomycin Day 4 ceftaroline   Physical Exam: Constitutional:  Vitals:   11/19/16 1227 11/19/16 1517  BP: 115/73   Pulse: 92   Resp: (!) 21   Temp: 98.4 F (36.9 C) 98.5 F (36.9 C)   patient appears in NAD Eyes: anicteric Chest: tube in place Respiratory: Normal respiratory effort; CTA B Cardiovascular: RRR  Review of Systems: Constitutional: negative for fevers and chills Gastrointestinal: negative for diarrhea Integument/breast: negative for rash  Lab Results  Component Value Date   WBC 8.7 11/19/2016   HGB 7.7 (L) 11/19/2016   HCT 24.8 (L) 11/19/2016   MCV 86.7 11/19/2016   PLT 335 11/19/2016    Lab Results  Component Value Date   CREATININE 1.37 (H) 11/19/2016   BUN 15 11/19/2016   NA 139 11/19/2016   K 3.9 11/19/2016   CL 109 11/19/2016   CO2 23 11/19/2016    Lab Results  Component Value Date   ALT 16 11/10/2016   AST 25 11/10/2016   ALKPHOS 164 (H) 11/10/2016     Microbiology: Recent Results (from the past 240 hour(s))  Blood Culture (routine x 2)     Status: Abnormal   Collection Time: 11/10/16  2:45 PM  Result Value Ref Range Status   Specimen Description BLOOD LEFT HAND  Final   Special Requests IN PEDIATRIC BOTTLE Blood Culture adequate volume  Final   Culture  Setup Time   Final    GRAM POSITIVE COCCI IN CLUSTERS IN PEDIATRIC BOTTLE CRITICAL VALUE NOTED.  VALUE IS CONSISTENT WITH PREVIOUSLY REPORTED AND CALLED VALUE.    Culture (A)  Final    STAPHYLOCOCCUS AUREUS SUSCEPTIBILITIES PERFORMED ON PREVIOUS CULTURE WITHIN THE LAST 5 DAYS. Performed at [facility], [address], [place], [place] [address]    Report Status 11/13/2016 FINAL  Final  Blood Culture (routine x 2)     Status: Abnormal   Collection Time: 11/10/16  2:55 PM  Result Value Ref Range Status   Specimen Description BLOOD LEFT ARM  Final   Special Requests   Final    BOTTLES DRAWN AEROBIC AND ANAEROBIC Blood Culture adequate volume   Culture  Setup Time   Final    IN BOTH AEROBIC AND ANAEROBIC BOTTLES GRAM POSITIVE COCCI IN CLUSTERS CRITICAL RESULT CALLED TO, READ BACK BY AND VERIFIED WITH: TO JGRIMSLEY(PHARMd) BY TCLEVELAND 11/11/2016 AT 6:15AM Performed at [facility], [address], [place], [place] [address]    Culture METHICILLIN RESISTANT STAPHYLOCOCCUS AUREUS (A)  Final   Report Status 11/13/2016 FINAL  Final   Organism ID, Bacteria METHICILLIN RESISTANT STAPHYLOCOCCUS AUREUS  Final      Susceptibility   Methicillin resistant staphylococcus aureus - MIC*    CIPROFLOXACIN >=8 RESISTANT Resistant     ERYTHROMYCIN >=8 RESISTANT Resistant     GENTAMICIN <=0.5 SENSITIVE Sensitive     OXACILLIN >=4 RESISTANT Resistant     TETRACYCLINE <=1 SENSITIVE Sensitive     VANCOMYCIN 1 SENSITIVE Sensitive     TRIMETH/SULFA <=10 SENSITIVE Sensitive     CLINDAMYCIN <=0.25 SENSITIVE Sensitive     RIFAMPIN <=0.5 SENSITIVE Sensitive     Inducible Clindamycin NEGATIVE Sensitive     * METHICILLIN RESISTANT STAPHYLOCOCCUS AUREUS  Blood Culture ID Panel (Reflexed)  Status: Abnormal   Collection Time: 11/10/16  2:55 PM  Result Value Ref Range Status   Enterococcus species NOT DETECTED NOT DETECTED Final   Listeria monocytogenes NOT DETECTED NOT DETECTED Final   Staphylococcus species DETECTED (A) NOT DETECTED Final    Comment: CRITICAL RESULT CALLED TO, READ BACK BY AND VERIFIED WITH: TO JGRIMSLEY(PHARMd) BY TCLEVELAND 11/11/2016 AT 6:15AM    Staphylococcus aureus DETECTED (A) NOT DETECTED Final    Comment: Methicillin (oxacillin)-resistant Staphylococcus aureus (MRSA). MRSA is predictably resistant to beta-lactam antibiotics (except ceftaroline). Preferred therapy is vancomycin unless clinically contraindicated.  Patient requires contact precautions if  hospitalized. CRITICAL RESULT CALLED TO, READ BACK BY AND VERIFIED WITH: TO JGRIMSLEY(PHARMd) BY TCLEVELAND 11/11/2016 AT 6:15AM    Methicillin resistance DETECTED (A) NOT DETECTED Final    Comment: CRITICAL RESULT CALLED TO, READ BACK BY AND VERIFIED WITH: TO JGRIMSLEY(PHARMd) BY TCLEVELAND 11/11/2016 AT 6:15AM    Streptococcus species NOT DETECTED NOT DETECTED Final   Streptococcus agalactiae NOT DETECTED NOT DETECTED Final   Streptococcus pneumoniae NOT DETECTED NOT DETECTED Final   Streptococcus pyogenes NOT DETECTED NOT DETECTED Final   Acinetobacter baumannii NOT DETECTED NOT DETECTED Final   Enterobacteriaceae species NOT DETECTED NOT DETECTED Final   Enterobacter cloacae complex NOT DETECTED NOT DETECTED Final   Escherichia coli NOT DETECTED NOT DETECTED Final   Klebsiella oxytoca NOT DETECTED NOT DETECTED Final   Klebsiella pneumoniae NOT DETECTED NOT DETECTED Final   Proteus species NOT DETECTED NOT DETECTED Final   Serratia marcescens NOT DETECTED NOT DETECTED Final   Haemophilus influenzae NOT DETECTED NOT DETECTED Final   Neisseria meningitidis NOT DETECTED NOT DETECTED Final   Pseudomonas aeruginosa NOT DETECTED NOT DETECTED Final   Candida albicans NOT DETECTED NOT DETECTED Final   Candida glabrata NOT DETECTED NOT DETECTED Final   Candida krusei NOT DETECTED NOT DETECTED Final   Candida parapsilosis NOT DETECTED NOT DETECTED Final   Candida tropicalis NOT DETECTED NOT DETECTED Final    Comment: Performed at [facility], [address] [place], [place] [address]  Urine culture     Status: None   Collection Time: 11/10/16  9:32 PM  Result Value Ref Range Status   Specimen Description URINE, CLEAN CATCH  Final   Special Requests NONE  Final   Culture   Final    NO GROWTH Performed at [facility], [address] [place], [place] [address]    Report Status 11/12/2016 FINAL  Final  Culture, blood (x 2)      Status: Abnormal   Collection Time: 11/10/16  9:46 PM  Result Value Ref Range Status   Specimen Description BLOOD LEFT ANTECUBITAL  Final   Special Requests IN PEDIATRIC BOTTLE Blood Culture adequate volume  Final   Culture  Setup Time   Final    GRAM POSITIVE COCCI IN CLUSTERS IN PEDIATRIC BOTTLE CRITICAL VALUE NOTED.  VALUE IS CONSISTENT WITH PREVIOUSLY REPORTED AND CALLED VALUE.    Culture (A)  Final    STAPHYLOCOCCUS AUREUS SUSCEPTIBILITIES PERFORMED ON PREVIOUS CULTURE WITHIN THE LAST 5 DAYS. Performed at [facility], [address], [place], [place] [address]    Report Status 11/13/2016 FINAL  Final  Culture, blood (x 2)     Status: Abnormal   Collection Time: 11/10/16  9:46 PM  Result Value Ref Range Status   Specimen Description BLOOD LEFT HAND  Final   Special Requests IN PEDIATRIC BOTTLE Blood Culture adequate volume  Final  Culture  Setup Time   Final    GRAM POSITIVE COCCI IN CLUSTERS IN PEDIATRIC BOTTLE CRITICAL RESULT CALLED TO, READ BACK BY AND VERIFIED WITH: D WOFFORD,PHARMD AT 1353 11/11/16 BY L BENFIELD    Culture (A)  Final    STAPHYLOCOCCUS AUREUS SUSCEPTIBILITIES PERFORMED ON PREVIOUS CULTURE WITHIN THE LAST 5 DAYS. Performed at [facility], [address] [place], [place] [address]    Report Status 11/13/2016 FINAL  Final  MRSA PCR Screening     Status: Abnormal   Collection Time: 11/10/16  9:58 PM  Result Value Ref Range Status   MRSA by PCR POSITIVE (A) NEGATIVE Final    Comment:        The GeneXpert MRSA Assay (FDA approved for NASAL specimens only), is one component of a comprehensive MRSA colonization surveillance program. It is not intended to diagnose MRSA infection nor to guide or monitor treatment for MRSA infections. RESULT CALLED TO, READ BACK BY AND VERIFIED WITH: ABBY CHAVEZ,RN [id number] @ 2357 BY J SCOTTON   Culture, blood (Routine X 2) w Reflex to ID Panel     Status: Abnormal   Collection Time: 11/11/16 10:30 AM    Result Value Ref Range Status   Specimen Description BLOOD RIGHT HAND  Final   Special Requests   Final    BOTTLES DRAWN AEROBIC AND ANAEROBIC Blood Culture adequate volume IMMUNOCOMPROMISED   Culture  Setup Time   Final    GRAM POSITIVE COCCI IN CLUSTERS IN BOTH AEROBIC AND ANAEROBIC BOTTLES CRITICAL VALUE NOTED.  VALUE IS CONSISTENT WITH PREVIOUSLY REPORTED AND CALLED VALUE.    Culture (A)  Final    STAPHYLOCOCCUS AUREUS SUSCEPTIBILITIES PERFORMED ON PREVIOUS CULTURE WITHIN THE LAST 5 DAYS. Performed at [facility], [address] [place], [place] [address]    Report Status 11/16/2016 FINAL  Final  Culture, blood (Routine X 2) w Reflex to ID Panel     Status: Abnormal   Collection Time: 11/11/16 10:34 AM  Result Value Ref Range Status   Specimen Description BLOOD BLOOD RIGHT HAND  Final   Special Requests   Final    BOTTLES DRAWN AEROBIC AND ANAEROBIC Blood Culture adequate volume   Culture  Setup Time   Final    GRAM POSITIVE COCCI IN CLUSTERS IN BOTH AEROBIC AND ANAEROBIC BOTTLES CRITICAL RESULT CALLED TO, READ BACK BY AND VERIFIED WITH: T. PICKERING PHARMD, AT 0702 11/12/16 BY D. VANHOOK    Culture (A)  Final    STAPHYLOCOCCUS AUREUS SUSCEPTIBILITIES PERFORMED ON PREVIOUS CULTURE WITHIN THE LAST 5 DAYS. Performed at [facility], [address] [place], [place] [address]    Report Status 11/14/2016 FINAL  Final  Culture, blood (Routine X 2) w Reflex to ID Panel     Status: Abnormal   Collection Time: 11/12/16  5:42 PM  Result Value Ref Range Status   Specimen Description BLOOD RIGHT ARM  Final   Special Requests IN PEDIATRIC BOTTLE Blood Culture adequate volume  Final   Culture  Setup Time   Final    GRAM POSITIVE COCCI IN CLUSTERS IN PEDIATRIC BOTTLE CRITICAL RESULT CALLED TO, READ BACK BY AND VERIFIED WITH: N. GLOGOVAC PHARMD, AT [id number] 11/13/16 BY D. VANHOOK    Culture (A)  Final    STAPHYLOCOCCUS AUREUS SUSCEPTIBILITIES PERFORMED ON PREVIOUS CULTURE  WITHIN THE LAST 5 DAYS. Performed at [facility], [address] [place], [place] [address]    Report Status 11/15/2016 FINAL  Final  Culture, blood (Routine X 2) w Reflex to ID Panel     Status: Abnormal   Collection Time: 11/12/16  5:43 PM  Result Value Ref Range Status   Specimen Description BLOOD RIGHT ARM  Final   Special Requests   Final    BOTTLES DRAWN AEROBIC AND ANAEROBIC Blood Culture adequate volume   Culture  Setup Time   Final    GRAM POSITIVE COCCI IN CLUSTERS ANAEROBIC BOTTLE ONLY CRITICAL VALUE NOTED.  VALUE IS CONSISTENT WITH PREVIOUSLY REPORTED AND CALLED VALUE.    Culture (A)  Final    STAPHYLOCOCCUS AUREUS SUSCEPTIBILITIES PERFORMED ON PREVIOUS CULTURE WITHIN THE LAST 5 DAYS. Performed at [facility], [address] [place], [place] [address]    Report Status 11/15/2016 FINAL  Final  Culture, blood (Routine X 2) w Reflex to ID Panel     Status: None   Collection Time: 11/13/16 10:49 AM  Result Value Ref Range Status   Specimen Description BLOOD RIGHT ANTECUBITAL  Final   Special Requests   Final    BOTTLES DRAWN AEROBIC ONLY Blood Culture adequate volume   Culture   Final    NO GROWTH 5 DAYS Performed at [facility], [address], [place], [place] [address]    Report Status 11/18/2016 FINAL  Final  Culture, blood (Routine X 2) w Reflex to ID Panel     Status: Abnormal   Collection Time: 11/13/16 10:52 AM  Result Value Ref Range Status   Specimen Description BLOOD LEFT ARM  Final   Special Requests IN PEDIATRIC BOTTLE Blood Culture adequate volume  Final   Culture  Setup Time   Final    IN PEDIATRIC BOTTLE GRAM POSITIVE COCCI IN CLUSTERS Organism ID to follow CRITICAL RESULT CALLED TO, READ BACK BY AND VERIFIED WITH: TO BGREEN(PHARMd) BY TCLEVELAND 11/14/2016 AT 5:05AM    Culture (A)  Final    STAPHYLOCOCCUS AUREUS SUSCEPTIBILITIES PERFORMED ON PREVIOUS CULTURE WITHIN THE LAST 5 DAYS. Performed at [facility], [address], [place], [place] [address]    Report Status 11/16/2016 FINAL  Final  Blood Culture ID Panel (Reflexed)     Status: Abnormal   Collection Time: 11/13/16 10:52 AM  Result Value Ref Range Status   Enterococcus species NOT DETECTED NOT DETECTED Final   Listeria monocytogenes NOT DETECTED NOT DETECTED Final   Staphylococcus species DETECTED (A) NOT DETECTED Final    Comment: CRITICAL RESULT CALLED TO, READ BACK BY AND VERIFIED WITH: TO BGREEN(PHARMd) BY TCLEVELAND 11/14/16 AT 5:05AM    Staphylococcus aureus DETECTED (A) NOT DETECTED Final    Comment: Methicillin (oxacillin)-resistant Staphylococcus aureus (MRSA). MRSA is predictably resistant to beta-lactam antibiotics (except ceftaroline). Preferred therapy is vancomycin unless clinically contraindicated. Patient requires contact precautions if  hospitalized. CRITICAL RESULT CALLED TO, READ BACK BY AND VERIFIED WITH: TO BGREEN(PHARMd) BY TCLEVELAND 11/14/16 AT 5:05AM    Methicillin resistance DETECTED (A) NOT DETECTED Final    Comment: CRITICAL RESULT CALLED TO, READ BACK BY AND VERIFIED WITH: TO BGREEN(PHARMd) BY TCLEVELAND 11/14/16 AT 5:05AM    Streptococcus species NOT DETECTED NOT DETECTED Final   Streptococcus agalactiae NOT DETECTED NOT DETECTED Final   Streptococcus pneumoniae NOT DETECTED NOT DETECTED Final   Streptococcus pyogenes NOT DETECTED NOT DETECTED Final   Acinetobacter baumannii NOT DETECTED NOT DETECTED Final   Enterobacteriaceae species NOT DETECTED NOT DETECTED Final   Enterobacter cloacae complex NOT DETECTED NOT DETECTED Final   Escherichia coli NOT  DETECTED NOT DETECTED Final   Klebsiella oxytoca NOT DETECTED NOT DETECTED Final   Klebsiella pneumoniae NOT DETECTED NOT DETECTED Final   Proteus species NOT DETECTED NOT DETECTED Final   Serratia marcescens NOT DETECTED NOT DETECTED Final   Haemophilus influenzae NOT DETECTED NOT DETECTED Final   Neisseria meningitidis NOT DETECTED NOT DETECTED Final    Pseudomonas aeruginosa NOT DETECTED NOT DETECTED Final   Candida albicans NOT DETECTED NOT DETECTED Final   Candida glabrata NOT DETECTED NOT DETECTED Final   Candida krusei NOT DETECTED NOT DETECTED Final   Candida parapsilosis NOT DETECTED NOT DETECTED Final   Candida tropicalis NOT DETECTED NOT DETECTED Final    Comment: Performed at [facility], [address] [place], [place] [address]  Culture, blood (Routine X 2) w Reflex to ID Panel     Status: None   Collection Time: 11/14/16 12:33 AM  Result Value Ref Range Status   Specimen Description BLOOD RIGHT ANTECUBITAL  Final   Special Requests   Final    BOTTLES DRAWN AEROBIC ONLY Blood Culture adequate volume   Culture   Final    NO GROWTH 5 DAYS Performed at [facility], [address], [place], [place] [address]    Report Status 11/19/2016 FINAL  Final  Culture, blood (Routine X 2) w Reflex to ID Panel     Status: Abnormal   Collection Time: 11/14/16 12:33 AM  Result Value Ref Range Status   Specimen Description BLOOD LEFT HAND  Final   Special Requests IN PEDIATRIC BOTTLE Blood Culture adequate volume  Final   Culture  Setup Time   Final    GRAM POSITIVE COCCI IN CLUSTERS IN PEDIATRIC BOTTLE CRITICAL VALUE NOTED.  VALUE IS CONSISTENT WITH PREVIOUSLY REPORTED AND CALLED VALUE.    Culture (A)  Final    STAPHYLOCOCCUS AUREUS SUSCEPTIBILITIES PERFORMED ON PREVIOUS CULTURE WITHIN THE LAST 5 DAYS. Performed at [facility], [address] [place], [place] [address]    Report Status 11/16/2016 FINAL  Final  Culture, blood (Routine X 2) w Reflex to ID Panel     Status: Abnormal   Collection Time: 11/14/16  4:10 PM  Result Value Ref Range Status   Specimen Description BLOOD RIGHT ARM  Final   Special Requests IN PEDIATRIC BOTTLE Blood Culture adequate volume  Final   Culture  Setup Time   Final    IN PEDIATRIC BOTTLE GRAM POSITIVE COCCI IN CLUSTERS CRITICAL RESULT CALLED TO, READ BACK BY AND VERIFIED WITH:  PHARMD A PHAM [id number] MLM    Culture (A)  Final    STAPHYLOCOCCUS AUREUS SUSCEPTIBILITIES PERFORMED ON PREVIOUS CULTURE WITHIN THE LAST 5 DAYS. Performed at [facility], [address] [place], [place] [address]    Report Status 11/17/2016 FINAL  Final  Culture, blood (Routine X 2) w Reflex to ID Panel     Status: None   Collection Time: 11/14/16  4:10 PM  Result Value Ref Range Status   Specimen Description BLOOD RIGHT ARM  Final   Special Requests   Final    BOTTLES DRAWN AEROBIC ONLY Blood Culture adequate volume   Culture   Final    NO GROWTH 5 DAYS Performed at [facility], [address] [place], [place] [address]    Report Status 11/19/2016 FINAL  Final  MRSA PCR Screening     Status: None   Collection Time: 11/16/16  5:49 PM  Result Value Ref  Range Status   MRSA by PCR NEGATIVE NEGATIVE Final    Comment:        The GeneXpert MRSA Assay (FDA approved for NASAL specimens only), is one component of a comprehensive MRSA colonization surveillance program. It is not intended to diagnose MRSA infection nor to guide or monitor treatment for MRSA infections. DELTA CHECK NOTED   Body fluid culture (includes gram stain)     Status: None (Preliminary result)   Collection Time: 11/16/16  8:06 PM  Result Value Ref Range Status   Specimen Description PLEURAL FLUID  Final   Special Requests NONE  Final   Gram Stain   Final    FEW WBC PRESENT, PREDOMINANTLY PMN NO ORGANISMS SEEN    Culture   Final    FEW STAPHYLOCOCCUS AUREUS SUSCEPTIBILITIES TO FOLLOW RESULT CALLED TO, READ BACK BY AND VERIFIED WITH: Ileene Patrick RN, AT 1023 11/18/16 BY D. VANHOOK REGARDING CULTURE GROWTH Performed at [facility], 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  Repeat blood cultures sent today, previous one on 5/30 still positive.    2.   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  No changes for now.   3.  Depression/anxiety - on buspar and celexa.  Stable.

## 2016-11-19 NOTE — Progress Notes (Signed)
      [address]       [place] [id number]             [phone]     CARDIOTHORACIC SURGERY PROGRESS NOTE  3 Days Post-Op  S/P Procedure(s) (LRB): TRANSESOPHAGEAL ECHOCARDIOGRAM (TEE) (N/A)  Subjective: Some pain right chest  Objective: Vital signs in last 24 hours: Temp:  [97.8 F (36.6 C)-98.5 F (36.9 C)] 98.5 F (36.9 C) (06/04 1517) Pulse Rate:  [78-92] 92 (06/04 1227) Cardiac Rhythm: Sinus tachycardia (06/04 1517) Resp:  [15-21] 21 (06/04 1227) BP: (110-115)/(72-85) 115/73 (06/04 1227) SpO2:  [98 %-100 %] 98 % (06/04 1227) Weight:  [144 lb 13.5 oz (65.7 kg)] 144 lb 13.5 oz (65.7 kg) (06/04 0500)  Physical Exam:  Rhythm:   sinus  Breath sounds: clear  Heart sounds:  RRR  Incisions:  n/a  Abdomen:  Soft, non-distended, non-tender  Extremities:  Warm, well-perfused    Intake/Output from previous day: 06/03 0701 - 06/04 0700 In: 2467.5 [P.O.:1680; I.V.:127.5; IV Piggyback:660] Out: 90 [Chest Tube:90] Intake/Output this shift: No intake/output data recorded.  Lab Results:  Recent Labs  11/18/16 1356 11/19/16 0414  WBC 10.7* 8.7  HGB 7.9* 7.7*  HCT 25.0* 24.8*  PLT 309 335   BMET:  Recent Labs  11/19/16 0414 11/19/16 1525 11/19/16 1828  NA 139 135  --   K 3.9 5.5* 3.9  CL 109 110  --   CO2 23 18*  --   GLUCOSE 97 85  --   BUN 15 14  --   CREATININE 1.37* 1.08*  --   CALCIUM 7.9* 7.7*  --     CBG (last 3)   Recent Labs  11/19/16 1223  GLUCAP 83   PT/INR:  No results for input(s): LABPROT, INR in the last 72 hours.  CXR:  CHEST  2 VIEW  COMPARISON:  11/19/2016.  FINDINGS: Right chest tube in stable position. No significant pneumothorax noted on today's exam. Small right pleural effusion. Bilateral patchy pulmonary infiltrates with cavitation again noted. Small left pleural effusion noted. Heart size stable. No acute bony abnormality .  IMPRESSION: 1. Right chest tube in stable position. No pneumothorax  noted on today's exam. Small right pleural effusion.  2. Persistent bilateral cavitary pulmonary infiltrates. Small left pleural effusion.   Electronically Signed   By: Maisie Fushomas  Register   On: 11/19/2016 16:53  Assessment/Plan:  Chest tube functioning well.  Leave to suction for now.  Purcell Nailslarence H Owen, MD 11/19/2016

## 2016-11-19 NOTE — Progress Notes (Signed)
Name: Arville LimeVictoria L Gillen MRN: [id number] DOB: Nov 04, 1990    ADMISSION DATE:  11/10/2016 CONSULTATION DATE:  11/11/2016  REFERRING MD :  Dr. Ethelda ChickJacubowitz   CHIEF COMPLAINT:  Endocarditis   BRIEF SUMMARY: 26 year old female with PMH of Anxiety/Depression, Polysubstance Abuse (Heronin, Cocaine)   Admitted to ED on 5/26 with Dyspnea and chest pain for one week. Reports Heronin use morning of arrival. Upon arrival to ED patient hypotensive, tachycardiac - non-responsive to fluid bolus, placed on neo gtt. Admitted to ICU under TRH. Blood cultures on 5/27 positive for MRSA, ID consulted. 2D Echo revealed right-sided vegetation.   Taken for TEE on 6/1 which revealed EF 55-60% with a 8x5 mm vegetation attached to posterior leaflet of the tricuspid valve, severe regurgitation. Patient arrived back to ICU, presented with shortness of breath and tachypnea. CXR revealed moderately large right-sided pneumothorax.     SUBJECTIVE: Pt denies acute complaints.  Questioning how long she will have to stay.  Suboxone use & pain control.    VITAL SIGNS: Temp:  [97.8 F (36.6 C)-99.1 F (37.3 C)] 98.5 F (36.9 C) (06/04 0721) Pulse Rate:  [78-109] 78 (06/04 0721) Resp:  [15-23] 20 (06/04 0721) BP: (110-122)/(71-87) 112/72 (06/04 0721) SpO2:  [96 %-100 %] 98 % (06/04 0721) Weight:  [142 lb 10.2 oz (64.7 kg)-144 lb 13.5 oz (65.7 kg)] 144 lb 13.5 oz (65.7 kg) (06/04 0500)  PHYSICAL EXAMINATION: General:  Young adult female in NAD, lying in bed HEENT: MM pink/moist, no jvd, tattoos on neck Neuro: AAOx4, speech clear, MAE CV: s1s2 rrr, no m/r/g PULM: even/non-labored, clear on L, coarse on R, large bore CT in place x1 with no air leak WU:JWJXGI:soft, non-tender, bsx4 active  Extremities: warm/dry, no edema  Skin: no rashes or lesions    Recent Labs Lab 11/17/16 0908 11/18/16 0214 11/19/16 0414  NA 138 138 139  K 4.2 4.1 3.9  CL 111 109 109  CO2 23 21* 23  BUN 13 14 15   CREATININE 0.76 0.99 1.37*    GLUCOSE 122* 97 97    Recent Labs Lab 11/18/16 0214 11/18/16 1356 11/19/16 0414  HGB 7.7* 7.9* 7.7*  HCT 24.3* 25.0* 24.8*  WBC 8.8 10.7* 8.7  PLT 243 309 335   Dg Chest 1v Repeat Same Day  Result Date: 11/18/2016 CLINICAL DATA:  Chest tube placement EXAM: CHEST - 1 VIEW SAME DAY COMPARISON:  Earlier same day FINDINGS: Large bore chest tube placed on the right along the lateral margin. Marked reduction an amount of right pleural air, nearly completely evacuated. Patchy infiltrates persist in both lower lobes. IMPRESSION: Large bore chest tube placed. Near complete resolution of right pneumothorax. Electronically Signed   By: Paulina FusiMark  Shogry M.D.   On: 11/18/2016 11:40   Dg Chest Port 1 View  Result Date: 11/19/2016 CLINICAL DATA:  26 year old female with cavitary lung lesions possibly from septic endocarditis. Subsequent encounter. EXAM: PORTABLE CHEST 1 VIEW COMPARISON:  11/18/2016. FINDINGS: Right-sided chest tube remains in place. Tiny hydropneumothorax versus fluid within minor fissure noted. Multiple cavitary lesions. Right-sided pleural effusion. Basilar atelectasis versus infiltrate greater on the left. Cardiomegaly. Mild scoliosis. IMPRESSION: Right-sided chest tube remains in place. Tiny hydropneumothorax versus fluid within minor fissure. Multiple cavitary lesions. Right-sided pleural effusion. Basilar atelectasis versus infiltrate greater on the left. Electronically Signed   By: Lacy DuverneySteven  Olson M.D.   On: 11/19/2016 07:16   Dg Chest Port 1 View  Result Date: 11/18/2016 CLINICAL DATA:  Followup left pneumothorax. Patient with  bacterial endocarditis. EXAM: PORTABLE CHEST 1 VIEW COMPARISON:  11/17/2016 and prior studies FINDINGS: A moderate right hydropneumothorax is unchanged in size, but now with pleural fluid in its basilar portion. A right thoracostomy tube is unchanged. Scattered pulmonary opacities/ nodules and left lower lung consolidations/atelectasis again noted. There has been no  other interval change. IMPRESSION: Moderate right hydropneumothorax, unchanged in size but now with pleural fluid in its basilar portion. Right thoracostomy tube remains unchanged. Electronically Signed   By: Harmon Pier M.D.   On: 11/18/2016 10:14   SIGNIFICANT EVENTS  5/26  Presents to ED 5/31  ABX changed to Teflaro 6/01  TEE, post procedure had pain in R shoulder / resp distress, R PTX with chest tube placement 6/02  Cubicin started 6/03  Residual PTX, large bore CT placed per CVTS (in addition to pigtail)   STUDIES:  CXR 5/26 >> New bilateral nodular airspace opacities throughout the upper and lower lobes bilaterally. Findings are concerning for multifocal infection such as septic emboli. CT Chest 5/26 >> Numerous cavitary consolidations throughout both lungs, with a peripheral distribution suggesting septic emboli. Largest cavitary consolidation is within the left lower lobe measuring approximately 4 cm greatest dimension CT A/P 5/30 >> Bibasilar, multifocal cavitary lesions noted within the visualized lower lobes consistent with septic emboli or possibly cavitary pneumonia. More confluent airspace disease in the left lower lobe without cavitation is also present. There is a small right pleural effusion. Anasarca with moderate volume of ascites noted within the abdomen and pelvis. Question right heart dysfunction/failure. Splenomegaly. Water attenuating cysts in the lower pole the right kidney in aggregate measuring 2 x 1.3 cm. Status post cholecystectomy. CXR 6/1 >> Moderately large right-sided pneumothorax new from the prior exam. Near complete collapse of the right lower lobe is noted  ANTIBIOTICS: Vanco 5/29 >> 6/1  Ceftaroline 5/31 >>  Daptomycin 6/2 >>  LINES: R CT (large bore) 6/3 >>   ASSESSMENT / PLAN:  Acute Hypoxic Respiratory Distress - secondary to R Pneumothorax post-procedure vs spontaneous  Septic Emboli to Lungs  Right Pneumothorax  Right Pleural Effusion - Staph  aureus in pleural fluid  P:  CT's to suction  Follow daily CXR while CT's in place  Appreciate CVTS assistance  Pulmonary hygiene - IS, mobilize O2 to maintain sats > 92% CT care per CVTS Follow pleural fluid from 6/1   MRSA Bacteremia in setting of Endocarditis with Septic Emboli  -TEE with 8x5 mm vegetation attached to posterior leaflet of tricuspid valve, severe regurgitation -HIV and Hepatitis panel negative  P:  Day 10/x abx ID following, appreciate input  Follow repeat cultures Trend fever curve / WBC trend    Canary Brim, NP-C [facility] Pgr: [phone] or if no answer [phone] 11/19/2016, 9:05 AM

## 2016-11-19 NOTE — Progress Notes (Signed)
[facility] PROGRESS NOTE  Savannah Palmer ZOX:[id number] DOB: Mar 10, 1991 DOA: 11/10/2016  PCP: Patient, No Pcp Per  Brief History/Interval Summary: 26 year old Caucasian female with a past medical history of anxiety, depression, heroin abuse, presented with weakness, cough and chest pain. Initially was in septic shock and required pressors. Blood cultures grew MRSA. 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 She was placed on IV antibiotics. She also developed a spontaneous right-sided pneumothorax. Chest tube was placed by critical care medicine. She was transferred to [facility] for opinion from cardiothoracic surgery. Patient is not a candidate for surgery at this time.  Reason for Visit: Tricuspid valve endocarditis with septic emboli. Spontaneous pneumothorax.  Consultants: Pulmonary and critical care medicine. Cardiothoracic surgery. Infectious disease.  Procedures:  TEE Study Conclusions  - Left ventricle: The cavity size was normal. Wall thickness was   normal. Systolic function was normal. The estimated ejection   fraction was in the range of 55% to 60%. Wall motion was normal;   there were no regional wall motion abnormalities. - Left atrium: No evidence of thrombus in the atrial cavity or   appendage. - Right ventricle: The cavity size was mildly dilated. Wall   thickness was normal. - Right atrium: No evidence of thrombus in the atrial cavity or   appendage. - Tricuspid valve: Flail motion of the posterior leaflet. There is   a 8x5 mm vegetation attached to the posterior leaflet, but most   of the &quot;mass&quot; described on transthoracic imaging appears to be   the flail segment of the valve. There was severe regurgitation   directed eccentrically and toward the free wall.  Right chest tube placement for pneumothorax 6/1  Placement of a larger bore chest tube in the right chest  6/3  Antibiotics: Anti-infectives    Start     Dose/Rate Route Frequency Ordered Stop   11/17/16 1600  DAPTOmycin (CUBICIN) 500 mg in sodium chloride 0.9 % IVPB     500 mg 220 mL/hr over 30 Minutes Intravenous Every 24 hours 11/17/16 1432     11/17/16 1500  ceftaroline (TEFLARO) 600 mg in sodium chloride 0.9 % 250 mL IVPB     600 mg 250 mL/hr over 60 Minutes Intravenous Every 12 hours 11/17/16 1435     11/15/16 1800  ceftaroline (TEFLARO) 600 mg in sodium chloride 0.9 % 250 mL IVPB  Status:  Discontinued     600 mg 250 mL/hr over 60 Minutes Intravenous Every 12 hours 11/15/16 1635 11/17/16 1431   11/13/16 1215  vancomycin (VANCOCIN) IVPB 1000 mg/200 mL premix  Status:  Discontinued     1,000 mg 200 mL/hr over 60 Minutes Intravenous Every 8 hours 11/13/16 1203 11/16/16 0850   11/11/16 1100  vancomycin (VANCOCIN) IVPB 750 mg/150 ml premix  Status:  Discontinued     750 mg 150 mL/hr over 60 Minutes Intravenous Every 8 hours 11/11/16 0956 11/13/16 1203   11/11/16 0400  vancomycin (VANCOCIN) 500 mg in sodium chloride 0.9 % 100 mL IVPB  Status:  Discontinued     500 mg 100 mL/hr over 60 Minutes Intravenous Every 12 hours 11/10/16 1951 11/11/16 0956   11/10/16 2200  ceFEPIme (MAXIPIME) 1 g in dextrose 5 % 50 mL IVPB  Status:  Discontinued     1 g 100 mL/hr over 30 Minutes Intravenous Every 12 hours 11/10/16 1951 11/11/16 0926   11/10/16 2000  ceFEPIme (MAXIPIME) 2  g in dextrose 5 % 50 mL IVPB  Status:  Discontinued     2 g 100 mL/hr over 30 Minutes Intravenous  Once 11/10/16 1952 11/10/16 1954   11/10/16 2000  vancomycin (VANCOCIN) IVPB 1000 mg/200 mL premix  Status:  Discontinued     1,000 mg 200 mL/hr over 60 Minutes Intravenous  Once 11/10/16 1952 11/10/16 1954   11/10/16 1430  piperacillin-tazobactam (ZOSYN) IVPB 3.375 g     3.375 g 100 mL/hr over 30 Minutes Intravenous  Once 11/10/16 1426 11/10/16 1503   11/10/16 1430  vancomycin (VANCOCIN) IVPB 1000 mg/200 mL premix     1,000  mg 200 mL/hr over 60 Minutes Intravenous  Once 11/10/16 1426 11/10/16 1653       Subjective/Interval History: Patient continues to have on and off pain in her right chest. Denies any other complaints at this time. Her mother is at the bedside.   ROS: Denies any nausea, vomiting  Objective:  Vital Signs  Vitals:   11/18/16 1927 11/18/16 2236 11/19/16 0311 11/19/16 0500  BP: 122/71 114/85 110/80   Pulse: (!) 101 87 79   Resp: (!) 23 15 19    Temp:  98.3 F (36.8 C) 97.8 F (36.6 C)   TempSrc:  Oral Oral   SpO2: 96% 98% 100%   Weight:    65.7 kg (144 lb 13.5 oz)  Height:        Intake/Output Summary (Last 24 hours) at 11/19/16 0748 Last data filed at 11/19/16 0545  Gross per 24 hour  Intake           2217.5 ml  Output               90 ml  Net           2127.5 ml   Filed Weights   11/17/16 1500 11/18/16 1400 11/19/16 0500  Weight: 61.6 kg (135 lb 12.9 oz) 64.7 kg (142 lb 10.2 oz) 65.7 kg (144 lb 13.5 oz)    General appearance: Awake and alert. In no distress. Resp: Diminished air entry at the bases. Few crackles. No wheezing. No rhonchi. Chest tube noted on the right  Cardio: S1, S2 is normal, regular. No S3, S4. Systolic murmur appreciated over the tricuspid area. Minimal pedal edema bilaterally GI: Abdomen is soft. Nontender, nondistended. Bowel sounds present. No masses or organomegaly Extremities: Mild bilateral pedal edema Neurologic: Awake and alert. Oriented 3. No focal neurological deficits noted  Lab Results:  Data Reviewed: I have personally reviewed following labs and imaging studies  CBC:  Recent Labs Lab 11/16/16 0548 11/17/16 0908 11/18/16 0214 11/18/16 1356 11/19/16 0414  WBC 11.4* 8.0 8.8 10.7* 8.7  HGB 9.3* 7.9* 7.7* 7.9* 7.7*  HCT 27.9* 24.6* 24.3* 25.0* 24.8*  MCV 84.3 85.1 85.9 85.9 86.7  PLT 160 179 243 309 335    Basic Metabolic Panel:  Recent Labs Lab 11/13/16 0335  11/15/16 0622 11/16/16 0548 11/17/16 0720 11/17/16 0908  11/18/16 0214 11/19/16 0414  NA  --   < > 135 139  --  138 138 139  K  --   < > 4.2 3.9  --  4.2 4.1 3.9  CL  --   < > 104 106  --  111 109 109  CO2  --   < > 23 26  --  23 21* 23  GLUCOSE  --   < > 98 94  --  122* 97 97  BUN  --   < >  9 11  --  13 14 15   CREATININE 0.61  < > 0.72 0.77 0.88 0.76 0.99 1.37*  CALCIUM  --   < > 7.4* 7.6*  --  7.6* 7.5* 7.9*  MG 1.6*  --   --   --   --   --  1.7 2.2  PHOS  --   --   --   --   --   --  4.9* 6.0*  < > = values in this interval not displayed.  GFR: Estimated Creatinine Clearance: 56.7 mL/min (A) (by C-G formula based on SCr of 1.37 mg/dL (H)).  Liver Function Tests:  Recent Labs Lab 11/16/16 2040  PROT 5.8*    Cardiac Enzymes:  Recent Labs Lab 11/18/16 0214  CKTOTAL 30*    CBG:  Recent Labs Lab 11/14/16 1151 11/14/16 1639 11/14/16 1947 11/15/16 0002 11/15/16 0748  GLUCAP 120* 84 92 109* 85    Anemia Panel:  Recent Labs  11/17/16 1343  VITAMINB12 1,850*  FOLATE 7.8  FERRITIN 213  TIBC 165*  IRON 12*  RETICCTPCT 2.5    Recent Results (from the past 240 hour(s))  Blood Culture (routine x 2)     Status: Abnormal   Collection Time: 11/10/16  2:45 PM  Result Value Ref Range Status   Specimen Description BLOOD LEFT HAND  Final   Special Requests IN PEDIATRIC BOTTLE Blood Culture adequate volume  Final   Culture  Setup Time   Final    GRAM POSITIVE COCCI IN CLUSTERS IN PEDIATRIC BOTTLE CRITICAL VALUE NOTED.  VALUE IS CONSISTENT WITH PREVIOUSLY REPORTED AND CALLED VALUE.    Culture (A)  Final    STAPHYLOCOCCUS AUREUS SUSCEPTIBILITIES PERFORMED ON PREVIOUS CULTURE WITHIN THE LAST 5 DAYS. Performed at [facility] Lab, [address] [place], [place] [address]    Report Status 11/13/2016 FINAL  Final  Blood Culture (routine x 2)     Status: Abnormal   Collection Time: 11/10/16  2:55 PM  Result Value Ref Range Status   Specimen Description BLOOD LEFT ARM  Final   Special Requests   Final    BOTTLES  DRAWN AEROBIC AND ANAEROBIC Blood Culture adequate volume   Culture  Setup Time   Final    IN BOTH AEROBIC AND ANAEROBIC BOTTLES GRAM POSITIVE COCCI IN CLUSTERS CRITICAL RESULT CALLED TO, READ BACK BY AND VERIFIED WITH: TO JGRIMSLEY(PHARMd) BY TCLEVELAND 11/11/2016 AT 6:15AM Performed at [facility] Lab, [address] [place], [place] [address]    Culture METHICILLIN RESISTANT STAPHYLOCOCCUS AUREUS (A)  Final   Report Status 11/13/2016 FINAL  Final   Organism ID, Bacteria METHICILLIN RESISTANT STAPHYLOCOCCUS AUREUS  Final      Susceptibility   Methicillin resistant staphylococcus aureus - MIC*    CIPROFLOXACIN >=8 RESISTANT Resistant     ERYTHROMYCIN >=8 RESISTANT Resistant     GENTAMICIN <=0.5 SENSITIVE Sensitive     OXACILLIN >=4 RESISTANT Resistant     TETRACYCLINE <=1 SENSITIVE Sensitive     VANCOMYCIN 1 SENSITIVE Sensitive     TRIMETH/SULFA <=10 SENSITIVE Sensitive     CLINDAMYCIN <=0.25 SENSITIVE Sensitive     RIFAMPIN <=0.5 SENSITIVE Sensitive     Inducible Clindamycin NEGATIVE Sensitive     * METHICILLIN RESISTANT STAPHYLOCOCCUS AUREUS  Blood Culture ID Panel (Reflexed)     Status: Abnormal   Collection Time: 11/10/16  2:55 PM  Result Value Ref Range Status   Enterococcus species NOT DETECTED NOT DETECTED Final   Listeria  monocytogenes NOT DETECTED NOT DETECTED Final   Staphylococcus species DETECTED (A) NOT DETECTED Final    Comment: CRITICAL RESULT CALLED TO, READ BACK BY AND VERIFIED WITH: TO JGRIMSLEY(PHARMd) BY TCLEVELAND 11/11/2016 AT 6:15AM    Staphylococcus aureus DETECTED (A) NOT DETECTED Final    Comment: Methicillin (oxacillin)-resistant Staphylococcus aureus (MRSA). MRSA is predictably resistant to beta-lactam antibiotics (except ceftaroline). Preferred therapy is vancomycin unless clinically contraindicated. Patient requires contact precautions if  hospitalized. CRITICAL RESULT CALLED TO, READ BACK BY AND VERIFIED WITH: TO JGRIMSLEY(PHARMd) BY TCLEVELAND  11/11/2016 AT 6:15AM    Methicillin resistance DETECTED (A) NOT DETECTED Final    Comment: CRITICAL RESULT CALLED TO, READ BACK BY AND VERIFIED WITH: TO JGRIMSLEY(PHARMd) BY TCLEVELAND 11/11/2016 AT 6:15AM    Streptococcus species NOT DETECTED NOT DETECTED Final   Streptococcus agalactiae NOT DETECTED NOT DETECTED Final   Streptococcus pneumoniae NOT DETECTED NOT DETECTED Final   Streptococcus pyogenes NOT DETECTED NOT DETECTED Final   Acinetobacter baumannii NOT DETECTED NOT DETECTED Final   Enterobacteriaceae species NOT DETECTED NOT DETECTED Final   Enterobacter cloacae complex NOT DETECTED NOT DETECTED Final   Escherichia coli NOT DETECTED NOT DETECTED Final   Klebsiella oxytoca NOT DETECTED NOT DETECTED Final   Klebsiella pneumoniae NOT DETECTED NOT DETECTED Final   Proteus species NOT DETECTED NOT DETECTED Final   Serratia marcescens NOT DETECTED NOT DETECTED Final   Haemophilus influenzae NOT DETECTED NOT DETECTED Final   Neisseria meningitidis NOT DETECTED NOT DETECTED Final   Pseudomonas aeruginosa NOT DETECTED NOT DETECTED Final   Candida albicans NOT DETECTED NOT DETECTED Final   Candida glabrata NOT DETECTED NOT DETECTED Final   Candida krusei NOT DETECTED NOT DETECTED Final   Candida parapsilosis NOT DETECTED NOT DETECTED Final   Candida tropicalis NOT DETECTED NOT DETECTED Final    Comment: Performed at [facility], [address] [place], [place] [address]  Urine culture     Status: None   Collection Time: 11/10/16  9:32 PM  Result Value Ref Range Status   Specimen Description URINE, CLEAN CATCH  Final   Special Requests NONE  Final   Culture   Final    NO GROWTH Performed at [facility], [address] [place], [place] [address]    Report Status 11/12/2016 FINAL  Final  Culture, blood (x 2)     Status: Abnormal   Collection Time: 11/10/16  9:46 PM  Result Value Ref Range Status   Specimen Description BLOOD LEFT ANTECUBITAL  Final   Special  Requests IN PEDIATRIC BOTTLE Blood Culture adequate volume  Final   Culture  Setup Time   Final    GRAM POSITIVE COCCI IN CLUSTERS IN PEDIATRIC BOTTLE CRITICAL VALUE NOTED.  VALUE IS CONSISTENT WITH PREVIOUSLY REPORTED AND CALLED VALUE.    Culture (A)  Final    STAPHYLOCOCCUS AUREUS SUSCEPTIBILITIES PERFORMED ON PREVIOUS CULTURE WITHIN THE LAST 5 DAYS. Performed at [facility], [address] [place], [place] [address]    Report Status 11/13/2016 FINAL  Final  Culture, blood (x 2)     Status: Abnormal   Collection Time: 11/10/16  9:46 PM  Result Value Ref Range Status   Specimen Description BLOOD LEFT HAND  Final   Special Requests IN PEDIATRIC BOTTLE Blood Culture adequate volume  Final   Culture  Setup Time   Final    GRAM POSITIVE COCCI IN CLUSTERS IN PEDIATRIC BOTTLE CRITICAL RESULT CALLED TO, READ BACK BY AND VERIFIED WITH:  D WOFFORD,PHARMD AT 1353 11/11/16 BY L BENFIELD    Culture (A)  Final    STAPHYLOCOCCUS AUREUS SUSCEPTIBILITIES PERFORMED ON PREVIOUS CULTURE WITHIN THE LAST 5 DAYS. Performed at [facility], [address], [place], [place] [address]    Report Status 11/13/2016 FINAL  Final  MRSA PCR Screening     Status: Abnormal   Collection Time: 11/10/16  9:58 PM  Result Value Ref Range Status   MRSA by PCR POSITIVE (A) NEGATIVE Final    Comment:        The GeneXpert MRSA Assay (FDA approved for NASAL specimens only), is one component of a comprehensive MRSA colonization surveillance program. It is not intended to diagnose MRSA infection nor to guide or monitor treatment for MRSA infections. RESULT CALLED TO, READ BACK BY AND VERIFIED WITH: ABBY CHAVEZ,RN [id number] @ 2357 BY J SCOTTON   Culture, blood (Routine X 2) w Reflex to ID Panel     Status: Abnormal   Collection Time: 11/11/16 10:30 AM  Result Value Ref Range Status   Specimen Description BLOOD RIGHT HAND  Final   Special Requests   Final    BOTTLES DRAWN AEROBIC AND ANAEROBIC Blood  Culture adequate volume IMMUNOCOMPROMISED   Culture  Setup Time   Final    GRAM POSITIVE COCCI IN CLUSTERS IN BOTH AEROBIC AND ANAEROBIC BOTTLES CRITICAL VALUE NOTED.  VALUE IS CONSISTENT WITH PREVIOUSLY REPORTED AND CALLED VALUE.    Culture (A)  Final    STAPHYLOCOCCUS AUREUS SUSCEPTIBILITIES PERFORMED ON PREVIOUS CULTURE WITHIN THE LAST 5 DAYS. Performed at [facility], [address] [place], [place] [address]    Report Status 11/16/2016 FINAL  Final  Culture, blood (Routine X 2) w Reflex to ID Panel     Status: Abnormal   Collection Time: 11/11/16 10:34 AM  Result Value Ref Range Status   Specimen Description BLOOD BLOOD RIGHT HAND  Final   Special Requests   Final    BOTTLES DRAWN AEROBIC AND ANAEROBIC Blood Culture adequate volume   Culture  Setup Time   Final    GRAM POSITIVE COCCI IN CLUSTERS IN BOTH AEROBIC AND ANAEROBIC BOTTLES CRITICAL RESULT CALLED TO, READ BACK BY AND VERIFIED WITH: T. PICKERING PHARMD, AT 0702 11/12/16 BY D. VANHOOK    Culture (A)  Final    STAPHYLOCOCCUS AUREUS SUSCEPTIBILITIES PERFORMED ON PREVIOUS CULTURE WITHIN THE LAST 5 DAYS. Performed at [facility], [address], [place], [place] [address]    Report Status 11/14/2016 FINAL  Final  Culture, blood (Routine X 2) w Reflex to ID Panel     Status: Abnormal   Collection Time: 11/12/16  5:42 PM  Result Value Ref Range Status   Specimen Description BLOOD RIGHT ARM  Final   Special Requests IN PEDIATRIC BOTTLE Blood Culture adequate volume  Final   Culture  Setup Time   Final    GRAM POSITIVE COCCI IN CLUSTERS IN PEDIATRIC BOTTLE CRITICAL RESULT CALLED TO, READ BACK BY AND VERIFIED WITH: N. GLOGOVAC PHARMD, AT 9562 11/13/16 BY D. VANHOOK    Culture (A)  Final    STAPHYLOCOCCUS AUREUS SUSCEPTIBILITIES PERFORMED ON PREVIOUS CULTURE WITHIN THE LAST 5 DAYS. Performed at [facility], [address] [place], [place] [address]    Report Status 11/15/2016 FINAL  Final  Culture,  blood (Routine X 2) w Reflex to ID Panel     Status: Abnormal   Collection Time: 11/12/16  5:43 PM  Result  Value Ref Range Status   Specimen Description BLOOD RIGHT ARM  Final   Special Requests   Final    BOTTLES DRAWN AEROBIC AND ANAEROBIC Blood Culture adequate volume   Culture  Setup Time   Final    GRAM POSITIVE COCCI IN CLUSTERS ANAEROBIC BOTTLE ONLY CRITICAL VALUE NOTED.  VALUE IS CONSISTENT WITH PREVIOUSLY REPORTED AND CALLED VALUE.    Culture (A)  Final    STAPHYLOCOCCUS AUREUS SUSCEPTIBILITIES PERFORMED ON PREVIOUS CULTURE WITHIN THE LAST 5 DAYS. Performed at [facility], [address], [place], [place] [address]    Report Status 11/15/2016 FINAL  Final  Culture, blood (Routine X 2) w Reflex to ID Panel     Status: None   Collection Time: 11/13/16 10:49 AM  Result Value Ref Range Status   Specimen Description BLOOD RIGHT ANTECUBITAL  Final   Special Requests   Final    BOTTLES DRAWN AEROBIC ONLY Blood Culture adequate volume   Culture   Final    NO GROWTH 5 DAYS Performed at [facility], [address] [place], [place] [address]    Report Status 11/18/2016 FINAL  Final  Culture, blood (Routine X 2) w Reflex to ID Panel     Status: Abnormal   Collection Time: 11/13/16 10:52 AM  Result Value Ref Range Status   Specimen Description BLOOD LEFT ARM  Final   Special Requests IN PEDIATRIC BOTTLE Blood Culture adequate volume  Final   Culture  Setup Time   Final    IN PEDIATRIC BOTTLE GRAM POSITIVE COCCI IN CLUSTERS Organism ID to follow CRITICAL RESULT CALLED TO, READ BACK BY AND VERIFIED WITH: TO BGREEN(PHARMd) BY TCLEVELAND 11/14/2016 AT 5:05AM    Culture (A)  Final    STAPHYLOCOCCUS AUREUS SUSCEPTIBILITIES PERFORMED ON PREVIOUS CULTURE WITHIN THE LAST 5 DAYS. Performed at [facility], [address] [place], [place] [address]    Report Status 11/16/2016 FINAL  Final  Blood Culture ID Panel (Reflexed)     Status: Abnormal   Collection Time:  11/13/16 10:52 AM  Result Value Ref Range Status   Enterococcus species NOT DETECTED NOT DETECTED Final   Listeria monocytogenes NOT DETECTED NOT DETECTED Final   Staphylococcus species DETECTED (A) NOT DETECTED Final    Comment: CRITICAL RESULT CALLED TO, READ BACK BY AND VERIFIED WITH: TO BGREEN(PHARMd) BY TCLEVELAND 11/14/16 AT 5:05AM    Staphylococcus aureus DETECTED (A) NOT DETECTED Final    Comment: Methicillin (oxacillin)-resistant Staphylococcus aureus (MRSA). MRSA is predictably resistant to beta-lactam antibiotics (except ceftaroline). Preferred therapy is vancomycin unless clinically contraindicated. Patient requires contact precautions if  hospitalized. CRITICAL RESULT CALLED TO, READ BACK BY AND VERIFIED WITH: TO BGREEN(PHARMd) BY TCLEVELAND 11/14/16 AT 5:05AM    Methicillin resistance DETECTED (A) NOT DETECTED Final    Comment: CRITICAL RESULT CALLED TO, READ BACK BY AND VERIFIED WITH: TO BGREEN(PHARMd) BY TCLEVELAND 11/14/16 AT 5:05AM    Streptococcus species NOT DETECTED NOT DETECTED Final   Streptococcus agalactiae NOT DETECTED NOT DETECTED Final   Streptococcus pneumoniae NOT DETECTED NOT DETECTED Final   Streptococcus pyogenes NOT DETECTED NOT DETECTED Final   Acinetobacter baumannii NOT DETECTED NOT DETECTED Final   Enterobacteriaceae species NOT DETECTED NOT DETECTED Final   Enterobacter cloacae complex NOT DETECTED NOT DETECTED Final   Escherichia coli NOT DETECTED NOT DETECTED Final   Klebsiella oxytoca NOT DETECTED NOT DETECTED Final   Klebsiella pneumoniae NOT DETECTED NOT DETECTED Final   Proteus species NOT DETECTED  NOT DETECTED Final   Serratia marcescens NOT DETECTED NOT DETECTED Final   Haemophilus influenzae NOT DETECTED NOT DETECTED Final   Neisseria meningitidis NOT DETECTED NOT DETECTED Final   Pseudomonas aeruginosa NOT DETECTED NOT DETECTED Final   Candida albicans NOT DETECTED NOT DETECTED Final   Candida glabrata NOT DETECTED NOT DETECTED Final    Candida krusei NOT DETECTED NOT DETECTED Final   Candida parapsilosis NOT DETECTED NOT DETECTED Final   Candida tropicalis NOT DETECTED NOT DETECTED Final    Comment: Performed at [facility], [address] [place], [place] [address]  Culture, blood (Routine X 2) w Reflex to ID Panel     Status: None (Preliminary result)   Collection Time: 11/14/16 12:33 AM  Result Value Ref Range Status   Specimen Description BLOOD RIGHT ANTECUBITAL  Final   Special Requests   Final    BOTTLES DRAWN AEROBIC ONLY Blood Culture adequate volume   Culture   Final    NO GROWTH 4 DAYS Performed at [facility], [address] [place], [place] [address]    Report Status PENDING  Incomplete  Culture, blood (Routine X 2) w Reflex to ID Panel     Status: Abnormal   Collection Time: 11/14/16 12:33 AM  Result Value Ref Range Status   Specimen Description BLOOD LEFT HAND  Final   Special Requests IN PEDIATRIC BOTTLE Blood Culture adequate volume  Final   Culture  Setup Time   Final    GRAM POSITIVE COCCI IN CLUSTERS IN PEDIATRIC BOTTLE CRITICAL VALUE NOTED.  VALUE IS CONSISTENT WITH PREVIOUSLY REPORTED AND CALLED VALUE.    Culture (A)  Final    STAPHYLOCOCCUS AUREUS SUSCEPTIBILITIES PERFORMED ON PREVIOUS CULTURE WITHIN THE LAST 5 DAYS. Performed at [facility], [address], [place], [place] [address]    Report Status 11/16/2016 FINAL  Final  Culture, blood (Routine X 2) w Reflex to ID Panel     Status: Abnormal   Collection Time: 11/14/16  4:10 PM  Result Value Ref Range Status   Specimen Description BLOOD RIGHT ARM  Final   Special Requests IN PEDIATRIC BOTTLE Blood Culture adequate volume  Final   Culture  Setup Time   Final    IN PEDIATRIC BOTTLE GRAM POSITIVE COCCI IN CLUSTERS CRITICAL RESULT CALLED TO, READ BACK BY AND VERIFIED WITH: PHARMD A PHAM [id number] 1036AM MLM    Culture (A)  Final    STAPHYLOCOCCUS AUREUS SUSCEPTIBILITIES PERFORMED ON PREVIOUS CULTURE WITHIN THE  LAST 5 DAYS. Performed at [facility], [address] [place], [place] [address]    Report Status 11/17/2016 FINAL  Final  Culture, blood (Routine X 2) w Reflex to ID Panel     Status: None (Preliminary result)   Collection Time: 11/14/16  4:10 PM  Result Value Ref Range Status   Specimen Description BLOOD RIGHT ARM  Final   Special Requests   Final    BOTTLES DRAWN AEROBIC ONLY Blood Culture adequate volume   Culture   Final    NO GROWTH 4 DAYS Performed at [facility], [address] [place], [place] [address]    Report Status PENDING  Incomplete  MRSA PCR Screening     Status: None   Collection Time: 11/16/16  5:49 PM  Result Value Ref Range Status   MRSA by PCR NEGATIVE NEGATIVE Final    Comment:        The GeneXpert MRSA Assay (FDA  approved for NASAL specimens only), is one component of a comprehensive MRSA colonization surveillance program. It is not intended to diagnose MRSA infection nor to guide or monitor treatment for MRSA infections. DELTA CHECK NOTED   Body fluid culture (includes gram stain)     Status: None (Preliminary result)   Collection Time: 11/16/16  8:06 PM  Result Value Ref Range Status   Specimen Description PLEURAL FLUID  Final   Special Requests NONE  Final   Gram Stain   Final    FEW WBC PRESENT, PREDOMINANTLY PMN NO ORGANISMS SEEN    Culture   Final    FEW STAPHYLOCOCCUS AUREUS SUSCEPTIBILITIES TO FOLLOW RESULT CALLED TO, READ BACK BY AND VERIFIED WITH: Ileene Patrick RN, AT 1023 11/18/16 BY D. VANHOOK REGARDING CULTURE GROWTH Performed at [facility] Lab, [address] [place], [place] [address]    Report Status PENDING  Incomplete      Radiology Studies: Dg Chest 1v Repeat Same Day  Result Date: 11/18/2016 CLINICAL DATA:  Chest tube placement EXAM: CHEST - 1 VIEW SAME DAY COMPARISON:  Earlier same day FINDINGS: Large bore chest tube placed on the right along the lateral margin. Marked reduction an amount of right pleural  air, nearly completely evacuated. Patchy infiltrates persist in both lower lobes. IMPRESSION: Large bore chest tube placed. Near complete resolution of right pneumothorax. Electronically Signed   By: Paulina Fusi M.D.   On: 11/18/2016 11:40   Dg Chest Port 1 View  Result Date: 11/19/2016 CLINICAL DATA:  26 year old female with cavitary lung lesions possibly from septic endocarditis. Subsequent encounter. EXAM: PORTABLE CHEST 1 VIEW COMPARISON:  11/18/2016. FINDINGS: Right-sided chest tube remains in place. Tiny hydropneumothorax versus fluid within minor fissure noted. Multiple cavitary lesions. Right-sided pleural effusion. Basilar atelectasis versus infiltrate greater on the left. Cardiomegaly. Mild scoliosis. IMPRESSION: Right-sided chest tube remains in place. Tiny hydropneumothorax versus fluid within minor fissure. Multiple cavitary lesions. Right-sided pleural effusion. Basilar atelectasis versus infiltrate greater on the left. Electronically Signed   By: Lacy Duverney M.D.   On: 11/19/2016 07:16   Dg Chest Port 1 View  Result Date: 11/18/2016 CLINICAL DATA:  Followup left pneumothorax. Patient with bacterial endocarditis. EXAM: PORTABLE CHEST 1 VIEW COMPARISON:  11/17/2016 and prior studies FINDINGS: A moderate right hydropneumothorax is unchanged in size, but now with pleural fluid in its basilar portion. A right thoracostomy tube is unchanged. Scattered pulmonary opacities/ nodules and left lower lung consolidations/atelectasis again noted. There has been no other interval change. IMPRESSION: Moderate right hydropneumothorax, unchanged in size but now with pleural fluid in its basilar portion. Right thoracostomy tube remains unchanged. Electronically Signed   By: Harmon Pier M.D.   On: 11/18/2016 10:14     Medications:  Scheduled: . acetaminophen  1,000 mg Oral Q6H  . buprenorphine-naloxone  2 tablet Sublingual Daily  . busPIRone  10 mg Oral BID  . citalopram  10 mg Oral Daily  . gabapentin   300 mg Oral TID  . heparin  5,000 Units Subcutaneous Q8H  . ibuprofen  600 mg Oral QID  . nicotine  14 mg Transdermal Daily  . saccharomyces boulardii  250 mg Oral BID   Continuous: . sodium chloride 250 mL (11/18/16 1900)  . ceFTAROline (TEFLARO) IV Stopped (11/19/16 0400)  . DAPTOmycin (CUBICIN)  IV Stopped (11/18/16 1738)   GNF:AOZHYQ chloride, albuterol, ALPRAZolam, docusate, ibuprofen, nicotine polacrilex, ondansetron (ZOFRAN) IV, oxyCODONE  Assessment/Plan:  Principal Problem:   Bacterial endocarditis Active Problems:  IVDU (intravenous drug user)   Polysubstance (including opioids) dependence, daily use (HCC)   Septic pulmonary embolism (HCC)   Septic shock (HCC)   Encounter for central line placement   Respiratory failure (HCC)   MRSA bacteremia   Hypokalemia   Hypophosphatemia   Endocarditis   Thrombocytopenia (HCC)   RUQ pain   Right hip pain   Pneumothorax on right   Tricuspid valve regurgitation, infectious    MRSA bacteremia/Septic shock with right TV endocarditis and septic emboli to lungs/MRSA empyema - Initial blood cultures from 5/27 were MRSA positive  -  Unfortunately repeat cultures (1/3 sets) on 5/30 continued to be positive for MRSA and she continued to have fevers of 102 - Patient was transitioned to Teflaro on 5/31 due to persistent fevers.  - 5/27- 2-D echo - showed a tricuspid vegetation with Moderate TR- -6/1- TEE shows a large tricuspid vegetation, severe TR with dilated RV along with a loculated left effusion - repeat cultures from 6/1 continue to be positive for staph aureus. 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  -Due to persistent bacteremia patient was started on daptomycin 6/2. Ceftaroline (Teflaro) also being continued to  cover lung infection. -Patient remains afebrile. Stable for the most part. Pleural fluid is positive for MRSA.  - Seen by cardiothoracic surgery. Not a candidate for surgery at this time. - Infectious disease continues to assist with management.  Right spontaneous pneumothorax- acute respiratory failure/MRSA Empyema - noted to be in respiratory distress after TEE and sent from [facility] Endo suite directly to SDU in [place] where a stat CXR showed a right pneumo - s/p chest tube.  - Pleural fluid positive for MRSA - Chest tube was replaced yesterday by cardiothoracic surgery. Defer management to cardiothoracic surgery and pulmonology.   Elevated creatinine Creatinine noted to be elevated today compared to yesterday. We will need to monitor urine output closely. We will give her a fluid bolus. Stop her NSAIDs. Check UA. Repeat labs needed today  RUQ pain, likely musculoskeletal - obtained CT scan- no noted abnormalities in liver or gallbladder- has ascites - pain appears to be musculoskeletal- K pad. Stable.  Ascites, anasarca due to right sided hear failure - as a result of severe TR - limit IVF and follow - hold off on administering diuretics in setting of sepsis and borderline BPs - follow daily weights and I and O. Weight appears to be stable.  Splenomegaly - etiology undetermined- CT on 5/30 revealed that the spleen is enlarged measuring 15.7 x 5.6 x 17 cm (volume= 780 cm^3) - liver is unremarkable on CT  Normocytic Anemia - Hemoglobin is low but stable. No evidence for overt bleeding - Iron was 12. Ferritin 213. B-12 1850. Folate 7.8.  Hypokalemia/Hypophosphatemia/Hypomagnesemia - repleted- follow   Thrombocytopenia   Has resolved  Heroin abuse - cont Suboxone- will need transition to a drug rehab program once stable.   Anxiety - cont Buspar, Neurontin, Xanax  Nicotine abuse - Nicoderm patch  DVT Prophylaxis: Subcutaneous heparin    Code Status: Full  code  Family Communication: Discussed with the patient and her mother  Disposition Plan: Management as outlined above. Continue stepdown setting for now    LOS: 9 days   Marguerette Sheller  [facility] Pager [phone] 11/19/2016, 7:48 AM  If 7PM-7AM, please contact night-coverage at [email or web address], password [facility]

## 2016-11-20 ENCOUNTER — Inpatient Hospital Stay (HOSPITAL_COMMUNITY): Payer: BLUE CROSS/BLUE SHIELD

## 2016-11-20 DIAGNOSIS — J90 Pleural effusion, not elsewhere classified: Secondary | ICD-10-CM

## 2016-11-20 DIAGNOSIS — I071 Rheumatic tricuspid insufficiency: Secondary | ICD-10-CM

## 2016-11-20 DIAGNOSIS — D649 Anemia, unspecified: Secondary | ICD-10-CM

## 2016-11-20 DIAGNOSIS — I330 Acute and subacute infective endocarditis: Secondary | ICD-10-CM

## 2016-11-20 LAB — BASIC METABOLIC PANEL
Anion gap: 6 (ref 5–15)
BUN: 10 mg/dL (ref 6–20)
CALCIUM: 7.9 mg/dL — AB (ref 8.9–10.3)
CO2: 22 mmol/L (ref 22–32)
CREATININE: 0.93 mg/dL (ref 0.44–1.00)
Chloride: 109 mmol/L (ref 101–111)
GFR calc Af Amer: >60 mL/min (ref >60)
GLUCOSE: 81 mg/dL (ref 65–99)
Potassium: 4 mmol/L (ref 3.5–5.1)
Sodium: 137 mmol/L (ref 135–145)

## 2016-11-20 LAB — CBC
HCT: 22.6 % — ABNORMAL LOW (ref 36.0–46.0)
Hemoglobin: 7.1 g/dL — ABNORMAL LOW (ref 12.0–15.0)
MCH: 27 pg (ref 26.0–34.0)
MCHC: 31.4 g/dL (ref 30.0–36.0)
MCV: 85.9 fL (ref 78.0–100.0)
Platelets: 398 10*3/uL (ref 150–400)
RBC: 2.63 MIL/uL — ABNORMAL LOW (ref 3.87–5.11)
RDW: 15.3 % (ref 11.5–15.5)
WBC: 9.4 10*3/uL (ref 4.0–10.5)

## 2016-11-20 LAB — MAGNESIUM: Magnesium: 1.7 mg/dL (ref 1.7–2.4)

## 2016-11-20 LAB — ABO/RH: ABO/RH(D): O POS

## 2016-11-20 LAB — BODY FLUID CULTURE

## 2016-11-20 LAB — PHOSPHORUS: Phosphorus: 4.9 mg/dL — ABNORMAL HIGH (ref 2.5–4.6)

## 2016-11-20 LAB — PREPARE RBC (CROSSMATCH)

## 2016-11-20 MED ORDER — SODIUM CHLORIDE 0.9 % IV SOLN
Freq: Once | INTRAVENOUS | Status: AC
  Administered 2016-11-20: 08:00:00 via INTRAVENOUS

## 2016-11-20 MED ORDER — FUROSEMIDE 10 MG/ML IJ SOLN
20.0000 mg | Freq: Once | INTRAMUSCULAR | Status: AC
  Administered 2016-11-20: 20 mg via INTRAVENOUS
  Filled 2016-11-20: qty 2

## 2016-11-20 NOTE — Care Management Note (Addendum)
Case Management Note  Patient Details  Name: Savannah Palmer MRN: [id number] Date of Birth: 1991-05-01  Subjective/Objective:  IVDU with MRSA TV endocarditis complicated by pulmonary septic emboli, empyema, right sided ptx, transferred from [facility] to get large bore chest tube , on cubicin IV and ceftaroline, ? Picc, may need 6 weeks of abx once cx's clear.   6/6 [name] RN, BSN - CT of chest notes new areas of emboli, conts with chest tube to water seal, will need picc line for 6 weeks of iv abx  thru 7/15  if cx's reamain negative per ID, she will need to go to a SNF, per MD note mother and patient is in agreement with this.  PCP-NCM will ast with PCP                  Action/Plan: NCM will cont to follow for dc needs.   Expected Discharge Date:                  Expected Discharge Plan:  Home/Self Care  In-House Referral:     Discharge planning Services  CM Consult  Post Acute Care Choice:    Choice offered to:     DME Arranged:    DME Agency:     HH Arranged:    HH Agency:     Status of Service:  In process, will continue to follow  If discussed at Long Length of Stay Meetings, dates discussed:    Additional Comments:  [name], [name], RN 11/20/2016, 5:27 PM

## 2016-11-20 NOTE — Progress Notes (Signed)
[facility] PROGRESS NOTE  Savannah LimeVictoria L Palmer ZOX:[id number] DOB: Mar 10, 1991 DOA: 11/10/2016  PCP: Patient, No Pcp Per  Brief History/Interval Summary: 26 year old Caucasian female with a past medical history of anxiety, depression, heroin abuse, presented with weakness, cough and chest pain. Initially was in septic shock and required pressors. Blood cultures grew MRSA. 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 She was placed on IV antibiotics. She also developed a spontaneous right-sided pneumothorax. Chest tube was placed by critical care medicine. She was transferred to [facility] for opinion from cardiothoracic surgery. Patient is not a candidate for surgery at this time.  Reason for Visit: Tricuspid valve endocarditis with septic emboli. Spontaneous pneumothorax.  Consultants: Pulmonary and critical care medicine. Cardiothoracic surgery. Infectious disease.  Procedures:  TEE Study Conclusions  - Left ventricle: The cavity size was normal. Wall thickness was   normal. Systolic function was normal. The estimated ejection   fraction was in the range of 55% to 60%. Wall motion was normal;   there were no regional wall motion abnormalities. - Left atrium: No evidence of thrombus in the atrial cavity or   appendage. - Right ventricle: The cavity size was mildly dilated. Wall   thickness was normal. - Right atrium: No evidence of thrombus in the atrial cavity or   appendage. - Tricuspid valve: Flail motion of the posterior leaflet. There is   a 8x5 mm vegetation attached to the posterior leaflet, but most   of the &quot;mass&quot; described on transthoracic imaging appears to be   the flail segment of the valve. There was severe regurgitation   directed eccentrically and toward the free wall.  Right chest tube placement for pneumothorax 6/1  Placement of a larger bore chest tube in the right chest  6/3  Antibiotics: Anti-infectives    Start     Dose/Rate Route Frequency Ordered Stop   11/17/16 1600  DAPTOmycin (CUBICIN) 500 mg in sodium chloride 0.9 % IVPB     500 mg 220 mL/hr over 30 Minutes Intravenous Every 24 hours 11/17/16 1432     11/17/16 1500  ceftaroline (TEFLARO) 600 mg in sodium chloride 0.9 % 250 mL IVPB     600 mg 250 mL/hr over 60 Minutes Intravenous Every 12 hours 11/17/16 1435     11/15/16 1800  ceftaroline (TEFLARO) 600 mg in sodium chloride 0.9 % 250 mL IVPB  Status:  Discontinued     600 mg 250 mL/hr over 60 Minutes Intravenous Every 12 hours 11/15/16 1635 11/17/16 1431   11/13/16 1215  vancomycin (VANCOCIN) IVPB 1000 mg/200 mL premix  Status:  Discontinued     1,000 mg 200 mL/hr over 60 Minutes Intravenous Every 8 hours 11/13/16 1203 11/16/16 0850   11/11/16 1100  vancomycin (VANCOCIN) IVPB 750 mg/150 ml premix  Status:  Discontinued     750 mg 150 mL/hr over 60 Minutes Intravenous Every 8 hours 11/11/16 0956 11/13/16 1203   11/11/16 0400  vancomycin (VANCOCIN) 500 mg in sodium chloride 0.9 % 100 mL IVPB  Status:  Discontinued     500 mg 100 mL/hr over 60 Minutes Intravenous Every 12 hours 11/10/16 1951 11/11/16 0956   11/10/16 2200  ceFEPIme (MAXIPIME) 1 g in dextrose 5 % 50 mL IVPB  Status:  Discontinued     1 g 100 mL/hr over 30 Minutes Intravenous Every 12 hours 11/10/16 1951 11/11/16 0926   11/10/16 2000  ceFEPIme (MAXIPIME) 2  g in dextrose 5 % 50 mL IVPB  Status:  Discontinued     2 g 100 mL/hr over 30 Minutes Intravenous  Once 11/10/16 1952 11/10/16 1954   11/10/16 2000  vancomycin (VANCOCIN) IVPB 1000 mg/200 mL premix  Status:  Discontinued     1,000 mg 200 mL/hr over 60 Minutes Intravenous  Once 11/10/16 1952 11/10/16 1954   11/10/16 1430  piperacillin-tazobactam (ZOSYN) IVPB 3.375 g     3.375 g 100 mL/hr over 30 Minutes Intravenous  Once 11/10/16 1426 11/10/16 1503   11/10/16 1430  vancomycin (VANCOCIN) IVPB 1000 mg/200 mL premix     1,000  mg 200 mL/hr over 60 Minutes Intravenous  Once 11/10/16 1426 11/10/16 1653       Subjective/Interval History: Patient continues to have on and off pain in her right chest. She did walk yesterday and felt short of breath. Denies any shortness of breath currently. No nausea, vomiting. Mother is at the bedside.   ROS: Denies any nausea, vomiting  Objective:  Vital Signs  Vitals:   11/19/16 2040 11/19/16 2318 11/20/16 0310 11/20/16 0317  BP: 113/84 116/73  108/68  Pulse: 100 95  97  Resp: (!) 28 20  20   Temp: 99.6 F (37.6 C) 99.3 F (37.4 C)  98 F (36.7 C)  TempSrc: Oral Oral  Oral  SpO2: 95% 95%  93%  Weight:   66.3 kg (146 lb 2.6 oz)   Height:        Intake/Output Summary (Last 24 hours) at 11/20/16 0727 Last data filed at 11/20/16 0407  Gross per 24 hour  Intake             1770 ml  Output             1240 ml  Net              530 ml   Filed Weights   11/18/16 1400 11/19/16 0500 11/20/16 0310  Weight: 64.7 kg (142 lb 10.2 oz) 65.7 kg (144 lb 13.5 oz) 66.3 kg (146 lb 2.6 oz)    General appearance: Awake, alert. No distress Resp: Diminished entry at the bases. Few crackles. No wheezing. Chest tube is noted in the right Cardio: S1, S2 is normal, regular. No S3, S4. Systolic murmur appreciated over the tricuspid area. Minimal pedal edema GI: Abdomen soft. Nontender, nondistended. Bowel sounds present. No masses or megaly Extremities: Mild pedal edema Neurologic: Oriented 3. No focal neurological deficits  Lab Results:  Data Reviewed: I have personally reviewed following labs and imaging studies  CBC:  Recent Labs Lab 11/17/16 0908 11/18/16 0214 11/18/16 1356 11/19/16 0414 11/20/16 0239  WBC 8.0 8.8 10.7* 8.7 9.4  HGB 7.9* 7.7* 7.9* 7.7* 7.1*  HCT 24.6* 24.3* 25.0* 24.8* 22.6*  MCV 85.1 85.9 85.9 86.7 85.9  PLT 179 243 309 335 398    Basic Metabolic Panel:  Recent Labs Lab 11/17/16 0908 11/18/16 0214 11/19/16 0414 11/19/16 1525 11/19/16 1828  11/20/16 0239  NA 138 138 139 135  --  137  K 4.2 4.1 3.9 5.5* 3.9 4.0  CL 111 109 109 110  --  109  CO2 23 21* 23 18*  --  22  GLUCOSE 122* 97 97 85  --  81  BUN 13 14 15 14   --  10  CREATININE 0.76 0.99 1.37* 1.08*  --  0.93  CALCIUM 7.6* 7.5* 7.9* 7.7*  --  7.9*  MG  --  1.7 2.2  --   --  1.7  PHOS  --  4.9* 6.0*  --   --  4.9*    GFR: Estimated Creatinine Clearance: 83.9 mL/min (by C-G formula based on SCr of 0.93 mg/dL).  Liver Function Tests:  Recent Labs Lab 11/16/16 2040  PROT 5.8*    Cardiac Enzymes:  Recent Labs Lab 11/18/16 0214  CKTOTAL 30*    CBG:  Recent Labs Lab 11/14/16 1639 11/14/16 1947 11/15/16 0002 11/15/16 0748 11/19/16 1223  GLUCAP 84 92 109* 85 83    Anemia Panel:  Recent Labs  11/17/16 1343  VITAMINB12 1,850*  FOLATE 7.8  FERRITIN 213  TIBC 165*  IRON 12*  RETICCTPCT 2.5    Recent Results (from the past 240 hour(s))  Blood Culture (routine x 2)     Status: Abnormal   Collection Time: 11/10/16  2:45 PM  Result Value Ref Range Status   Specimen Description BLOOD LEFT HAND  Final   Special Requests IN PEDIATRIC BOTTLE Blood Culture adequate volume  Final   Culture  Setup Time   Final    GRAM POSITIVE COCCI IN CLUSTERS IN PEDIATRIC BOTTLE CRITICAL VALUE NOTED.  VALUE IS CONSISTENT WITH PREVIOUSLY REPORTED AND CALLED VALUE.    Culture (A)  Final    STAPHYLOCOCCUS AUREUS SUSCEPTIBILITIES PERFORMED ON PREVIOUS CULTURE WITHIN THE LAST 5 DAYS. Performed at [facility], [address], [place], [place] [address]    Report Status 11/13/2016 FINAL  Final  Blood Culture (routine x 2)     Status: Abnormal   Collection Time: 11/10/16  2:55 PM  Result Value Ref Range Status   Specimen Description BLOOD LEFT ARM  Final   Special Requests   Final    BOTTLES DRAWN AEROBIC AND ANAEROBIC Blood Culture adequate volume   Culture  Setup Time   Final    IN BOTH AEROBIC AND ANAEROBIC BOTTLES GRAM POSITIVE COCCI IN  CLUSTERS CRITICAL RESULT CALLED TO, READ BACK BY AND VERIFIED WITH: TO JGRIMSLEY(PHARMd) BY TCLEVELAND 11/11/2016 AT 6:15AM Performed at [facility] Lab, [address], [place], [place] [address]    Culture METHICILLIN RESISTANT STAPHYLOCOCCUS AUREUS (A)  Final   Report Status 11/13/2016 FINAL  Final   Organism ID, Bacteria METHICILLIN RESISTANT STAPHYLOCOCCUS AUREUS  Final      Susceptibility   Methicillin resistant staphylococcus aureus - MIC*    CIPROFLOXACIN >=8 RESISTANT Resistant     ERYTHROMYCIN >=8 RESISTANT Resistant     GENTAMICIN <=0.5 SENSITIVE Sensitive     OXACILLIN >=4 RESISTANT Resistant     TETRACYCLINE <=1 SENSITIVE Sensitive     VANCOMYCIN 1 SENSITIVE Sensitive     TRIMETH/SULFA <=10 SENSITIVE Sensitive     CLINDAMYCIN <=0.25 SENSITIVE Sensitive     RIFAMPIN <=0.5 SENSITIVE Sensitive     Inducible Clindamycin NEGATIVE Sensitive     * METHICILLIN RESISTANT STAPHYLOCOCCUS AUREUS  Blood Culture ID Panel (Reflexed)     Status: Abnormal   Collection Time: 11/10/16  2:55 PM  Result Value Ref Range Status   Enterococcus species NOT DETECTED NOT DETECTED Final   Listeria monocytogenes NOT DETECTED NOT DETECTED Final   Staphylococcus species DETECTED (A) NOT DETECTED Final    Comment: CRITICAL RESULT CALLED TO, READ BACK BY AND VERIFIED WITH: TO JGRIMSLEY(PHARMd) BY TCLEVELAND 11/11/2016 AT 6:15AM    Staphylococcus aureus DETECTED (A) NOT DETECTED Final    Comment: Methicillin (oxacillin)-resistant Staphylococcus aureus (MRSA). MRSA is predictably resistant to beta-lactam antibiotics (except ceftaroline). Preferred therapy is vancomycin unless clinically contraindicated. Patient requires  contact precautions if  hospitalized. CRITICAL RESULT CALLED TO, READ BACK BY AND VERIFIED WITH: TO JGRIMSLEY(PHARMd) BY TCLEVELAND 11/11/2016 AT 6:15AM    Methicillin resistance DETECTED (A) NOT DETECTED Final    Comment: CRITICAL RESULT CALLED TO, READ BACK BY AND VERIFIED WITH: TO  JGRIMSLEY(PHARMd) BY TCLEVELAND 11/11/2016 AT 6:15AM    Streptococcus species NOT DETECTED NOT DETECTED Final   Streptococcus agalactiae NOT DETECTED NOT DETECTED Final   Streptococcus pneumoniae NOT DETECTED NOT DETECTED Final   Streptococcus pyogenes NOT DETECTED NOT DETECTED Final   Acinetobacter baumannii NOT DETECTED NOT DETECTED Final   Enterobacteriaceae species NOT DETECTED NOT DETECTED Final   Enterobacter cloacae complex NOT DETECTED NOT DETECTED Final   Escherichia coli NOT DETECTED NOT DETECTED Final   Klebsiella oxytoca NOT DETECTED NOT DETECTED Final   Klebsiella pneumoniae NOT DETECTED NOT DETECTED Final   Proteus species NOT DETECTED NOT DETECTED Final   Serratia marcescens NOT DETECTED NOT DETECTED Final   Haemophilus influenzae NOT DETECTED NOT DETECTED Final   Neisseria meningitidis NOT DETECTED NOT DETECTED Final   Pseudomonas aeruginosa NOT DETECTED NOT DETECTED Final   Candida albicans NOT DETECTED NOT DETECTED Final   Candida glabrata NOT DETECTED NOT DETECTED Final   Candida krusei NOT DETECTED NOT DETECTED Final   Candida parapsilosis NOT DETECTED NOT DETECTED Final   Candida tropicalis NOT DETECTED NOT DETECTED Final    Comment: Performed at [facility], [address], [place], [place] [address]  Urine culture     Status: None   Collection Time: 11/10/16  9:32 PM  Result Value Ref Range Status   Specimen Description URINE, CLEAN CATCH  Final   Special Requests NONE  Final   Culture   Final    NO GROWTH Performed at [facility], [address], [place], [place] [address]    Report Status 11/12/2016 FINAL  Final  Culture, blood (x 2)     Status: Abnormal   Collection Time: 11/10/16  9:46 PM  Result Value Ref Range Status   Specimen Description BLOOD LEFT ANTECUBITAL  Final   Special Requests IN PEDIATRIC BOTTLE Blood Culture adequate volume  Final   Culture  Setup Time   Final    GRAM POSITIVE COCCI IN CLUSTERS IN PEDIATRIC  BOTTLE CRITICAL VALUE NOTED.  VALUE IS CONSISTENT WITH PREVIOUSLY REPORTED AND CALLED VALUE.    Culture (A)  Final    STAPHYLOCOCCUS AUREUS SUSCEPTIBILITIES PERFORMED ON PREVIOUS CULTURE WITHIN THE LAST 5 DAYS. Performed at [facility], [address] [place], [place] [address]    Report Status 11/13/2016 FINAL  Final  Culture, blood (x 2)     Status: Abnormal   Collection Time: 11/10/16  9:46 PM  Result Value Ref Range Status   Specimen Description BLOOD LEFT HAND  Final   Special Requests IN PEDIATRIC BOTTLE Blood Culture adequate volume  Final   Culture  Setup Time   Final    GRAM POSITIVE COCCI IN CLUSTERS IN PEDIATRIC BOTTLE CRITICAL RESULT CALLED TO, READ BACK BY AND VERIFIED WITH: D WOFFORD,PHARMD AT 1353 11/11/16 BY L BENFIELD    Culture (A)  Final    STAPHYLOCOCCUS AUREUS SUSCEPTIBILITIES PERFORMED ON PREVIOUS CULTURE WITHIN THE LAST 5 DAYS. Performed at [facility], [address] [place], [place] [address]    Report Status 11/13/2016 FINAL  Final  MRSA PCR Screening     Status: Abnormal   Collection Time: 11/10/16  9:58 PM  Result Value  Ref Range Status   MRSA by PCR POSITIVE (A) NEGATIVE Final    Comment:        The GeneXpert MRSA Assay (FDA approved for NASAL specimens only), is one component of a comprehensive MRSA colonization surveillance program. It is not intended to diagnose MRSA infection nor to guide or monitor treatment for MRSA infections. RESULT CALLED TO, READ BACK BY AND VERIFIED WITH: ABBY CHAVEZ,RN [id number] @ 2357 BY J SCOTTON   Culture, blood (Routine X 2) w Reflex to ID Panel     Status: Abnormal   Collection Time: 11/11/16 10:30 AM  Result Value Ref Range Status   Specimen Description BLOOD RIGHT HAND  Final   Special Requests   Final    BOTTLES DRAWN AEROBIC AND ANAEROBIC Blood Culture adequate volume IMMUNOCOMPROMISED   Culture  Setup Time   Final    GRAM POSITIVE COCCI IN CLUSTERS IN BOTH AEROBIC AND ANAEROBIC  BOTTLES CRITICAL VALUE NOTED.  VALUE IS CONSISTENT WITH PREVIOUSLY REPORTED AND CALLED VALUE.    Culture (A)  Final    STAPHYLOCOCCUS AUREUS SUSCEPTIBILITIES PERFORMED ON PREVIOUS CULTURE WITHIN THE LAST 5 DAYS. Performed at [facility], [address] [place], [place] [address]    Report Status 11/16/2016 FINAL  Final  Culture, blood (Routine X 2) w Reflex to ID Panel     Status: Abnormal   Collection Time: 11/11/16 10:34 AM  Result Value Ref Range Status   Specimen Description BLOOD BLOOD RIGHT HAND  Final   Special Requests   Final    BOTTLES DRAWN AEROBIC AND ANAEROBIC Blood Culture adequate volume   Culture  Setup Time   Final    GRAM POSITIVE COCCI IN CLUSTERS IN BOTH AEROBIC AND ANAEROBIC BOTTLES CRITICAL RESULT CALLED TO, READ BACK BY AND VERIFIED WITH: T. PICKERING PHARMD, AT 0702 11/12/16 BY D. VANHOOK    Culture (A)  Final    STAPHYLOCOCCUS AUREUS SUSCEPTIBILITIES PERFORMED ON PREVIOUS CULTURE WITHIN THE LAST 5 DAYS. Performed at [facility], [address], [place], [place] [address]    Report Status 11/14/2016 FINAL  Final  Culture, blood (Routine X 2) w Reflex to ID Panel     Status: Abnormal   Collection Time: 11/12/16  5:42 PM  Result Value Ref Range Status   Specimen Description BLOOD RIGHT ARM  Final   Special Requests IN PEDIATRIC BOTTLE Blood Culture adequate volume  Final   Culture  Setup Time   Final    GRAM POSITIVE COCCI IN CLUSTERS IN PEDIATRIC BOTTLE CRITICAL RESULT CALLED TO, READ BACK BY AND VERIFIED WITH: N. GLOGOVAC PHARMD, AT 1478 11/13/16 BY D. VANHOOK    Culture (A)  Final    STAPHYLOCOCCUS AUREUS SUSCEPTIBILITIES PERFORMED ON PREVIOUS CULTURE WITHIN THE LAST 5 DAYS. Performed at [facility], [address], [place], [place] [address]    Report Status 11/15/2016 FINAL  Final  Culture, blood (Routine X 2) w Reflex to ID Panel     Status: Abnormal   Collection Time: 11/12/16  5:43 PM  Result Value Ref Range Status    Specimen Description BLOOD RIGHT ARM  Final   Special Requests   Final    BOTTLES DRAWN AEROBIC AND ANAEROBIC Blood Culture adequate volume   Culture  Setup Time   Final    GRAM POSITIVE COCCI IN CLUSTERS ANAEROBIC BOTTLE ONLY CRITICAL VALUE NOTED.  VALUE IS CONSISTENT WITH PREVIOUSLY REPORTED AND CALLED VALUE.    Culture (A)  Final  STAPHYLOCOCCUS AUREUS SUSCEPTIBILITIES PERFORMED ON PREVIOUS CULTURE WITHIN THE LAST 5 DAYS. Performed at [facility], [address] [place], [place] [address]    Report Status 11/15/2016 FINAL  Final  Culture, blood (Routine X 2) w Reflex to ID Panel     Status: None   Collection Time: 11/13/16 10:49 AM  Result Value Ref Range Status   Specimen Description BLOOD RIGHT ANTECUBITAL  Final   Special Requests   Final    BOTTLES DRAWN AEROBIC ONLY Blood Culture adequate volume   Culture   Final    NO GROWTH 5 DAYS Performed at [facility], [address] [place], [place] [address]    Report Status 11/18/2016 FINAL  Final  Culture, blood (Routine X 2) w Reflex to ID Panel     Status: Abnormal   Collection Time: 11/13/16 10:52 AM  Result Value Ref Range Status   Specimen Description BLOOD LEFT ARM  Final   Special Requests IN PEDIATRIC BOTTLE Blood Culture adequate volume  Final   Culture  Setup Time   Final    IN PEDIATRIC BOTTLE GRAM POSITIVE COCCI IN CLUSTERS Organism ID to follow CRITICAL RESULT CALLED TO, READ BACK BY AND VERIFIED WITH: TO BGREEN(PHARMd) BY TCLEVELAND 11/14/2016 AT 5:05AM    Culture (A)  Final    STAPHYLOCOCCUS AUREUS SUSCEPTIBILITIES PERFORMED ON PREVIOUS CULTURE WITHIN THE LAST 5 DAYS. Performed at [facility], [address], [place], [place] [address]    Report Status 11/16/2016 FINAL  Final  Blood Culture ID Panel (Reflexed)     Status: Abnormal   Collection Time: 11/13/16 10:52 AM  Result Value Ref Range Status   Enterococcus species NOT DETECTED NOT DETECTED Final   Listeria monocytogenes NOT  DETECTED NOT DETECTED Final   Staphylococcus species DETECTED (A) NOT DETECTED Final    Comment: CRITICAL RESULT CALLED TO, READ BACK BY AND VERIFIED WITH: TO BGREEN(PHARMd) BY TCLEVELAND 11/14/16 AT 5:05AM    Staphylococcus aureus DETECTED (A) NOT DETECTED Final    Comment: Methicillin (oxacillin)-resistant Staphylococcus aureus (MRSA). MRSA is predictably resistant to beta-lactam antibiotics (except ceftaroline). Preferred therapy is vancomycin unless clinically contraindicated. Patient requires contact precautions if  hospitalized. CRITICAL RESULT CALLED TO, READ BACK BY AND VERIFIED WITH: TO BGREEN(PHARMd) BY TCLEVELAND 11/14/16 AT 5:05AM    Methicillin resistance DETECTED (A) NOT DETECTED Final    Comment: CRITICAL RESULT CALLED TO, READ BACK BY AND VERIFIED WITH: TO BGREEN(PHARMd) BY TCLEVELAND 11/14/16 AT 5:05AM    Streptococcus species NOT DETECTED NOT DETECTED Final   Streptococcus agalactiae NOT DETECTED NOT DETECTED Final   Streptococcus pneumoniae NOT DETECTED NOT DETECTED Final   Streptococcus pyogenes NOT DETECTED NOT DETECTED Final   Acinetobacter baumannii NOT DETECTED NOT DETECTED Final   Enterobacteriaceae species NOT DETECTED NOT DETECTED Final   Enterobacter cloacae complex NOT DETECTED NOT DETECTED Final   Escherichia coli NOT DETECTED NOT DETECTED Final   Klebsiella oxytoca NOT DETECTED NOT DETECTED Final   Klebsiella pneumoniae NOT DETECTED NOT DETECTED Final   Proteus species NOT DETECTED NOT DETECTED Final   Serratia marcescens NOT DETECTED NOT DETECTED Final   Haemophilus influenzae NOT DETECTED NOT DETECTED Final   Neisseria meningitidis NOT DETECTED NOT DETECTED Final   Pseudomonas aeruginosa NOT DETECTED NOT DETECTED Final   Candida albicans NOT DETECTED NOT DETECTED Final   Candida glabrata NOT DETECTED NOT DETECTED Final   Candida krusei NOT DETECTED NOT DETECTED Final   Candida parapsilosis NOT DETECTED NOT DETECTED Final  Candida tropicalis NOT  DETECTED NOT DETECTED Final    Comment: Performed at [facility], [address], [place], [place] [address]  Culture, blood (Routine X 2) w Reflex to ID Panel     Status: None   Collection Time: 11/14/16 12:33 AM  Result Value Ref Range Status   Specimen Description BLOOD RIGHT ANTECUBITAL  Final   Special Requests   Final    BOTTLES DRAWN AEROBIC ONLY Blood Culture adequate volume   Culture   Final    NO GROWTH 5 DAYS Performed at [facility], [address] [place], [place] [address]    Report Status 11/19/2016 FINAL  Final  Culture, blood (Routine X 2) w Reflex to ID Panel     Status: Abnormal   Collection Time: 11/14/16 12:33 AM  Result Value Ref Range Status   Specimen Description BLOOD LEFT HAND  Final   Special Requests IN PEDIATRIC BOTTLE Blood Culture adequate volume  Final   Culture  Setup Time   Final    GRAM POSITIVE COCCI IN CLUSTERS IN PEDIATRIC BOTTLE CRITICAL VALUE NOTED.  VALUE IS CONSISTENT WITH PREVIOUSLY REPORTED AND CALLED VALUE.    Culture (A)  Final    STAPHYLOCOCCUS AUREUS SUSCEPTIBILITIES PERFORMED ON PREVIOUS CULTURE WITHIN THE LAST 5 DAYS. Performed at [facility], [address], [place], [place] [address]    Report Status 11/16/2016 FINAL  Final  Culture, blood (Routine X 2) w Reflex to ID Panel     Status: Abnormal   Collection Time: 11/14/16  4:10 PM  Result Value Ref Range Status   Specimen Description BLOOD RIGHT ARM  Final   Special Requests IN PEDIATRIC BOTTLE Blood Culture adequate volume  Final   Culture  Setup Time   Final    IN PEDIATRIC BOTTLE GRAM POSITIVE COCCI IN CLUSTERS CRITICAL RESULT CALLED TO, READ BACK BY AND VERIFIED WITH: PHARMD A PHAM [id number] [id number] MLM    Culture (A)  Final    STAPHYLOCOCCUS AUREUS SUSCEPTIBILITIES PERFORMED ON PREVIOUS CULTURE WITHIN THE LAST 5 DAYS. Performed at [facility], [address], [place], [place] [address]    Report Status 11/17/2016 FINAL  Final  Culture, blood  (Routine X 2) w Reflex to ID Panel     Status: None   Collection Time: 11/14/16  4:10 PM  Result Value Ref Range Status   Specimen Description BLOOD RIGHT ARM  Final   Special Requests   Final    BOTTLES DRAWN AEROBIC ONLY Blood Culture adequate volume   Culture   Final    NO GROWTH 5 DAYS Performed at [facility], [address], [place], [place] [address]    Report Status 11/19/2016 FINAL  Final  MRSA PCR Screening     Status: None   Collection Time: 11/16/16  5:49 PM  Result Value Ref Range Status   MRSA by PCR NEGATIVE NEGATIVE Final    Comment:        The GeneXpert MRSA Assay (FDA approved for NASAL specimens only), is one component of a comprehensive MRSA colonization surveillance program. It is not intended to diagnose MRSA infection nor to guide or monitor treatment for MRSA infections. DELTA CHECK NOTED   Body fluid culture (includes gram stain)     Status: None (Preliminary result)   Collection Time: 11/16/16  8:06 PM  Result Value Ref Range Status   Specimen Description PLEURAL FLUID  Final   Special Requests NONE  Final   Gram Stain   Final    FEW WBC PRESENT, PREDOMINANTLY PMN NO ORGANISMS SEEN    Culture   Final    FEW STAPHYLOCOCCUS AUREUS SUSCEPTIBILITIES TO FOLLOW RESULT CALLED TO, READ BACK BY AND VERIFIED WITH: Ileene Patrick. MYRICK RN, AT 1023 11/18/16 BY D. VANHOOK REGARDING CULTURE GROWTH Performed at [facility], [address] [place], [place] [id number]    Report Status PENDING  Incomplete      Radiology Studies: Dg Chest 2 View  Result Date: 11/19/2016 CLINICAL DATA:  Pneumothorax. EXAM: CHEST  2 VIEW COMPARISON:  11/19/2016. FINDINGS: Right chest tube in stable position. No significant pneumothorax noted on today's exam. Small right pleural effusion. Bilateral patchy pulmonary infiltrates with cavitation again noted. Small left pleural effusion noted. Heart size stable. No acute bony abnormality . IMPRESSION: 1. Right chest tube in stable  position. No pneumothorax noted on today's exam. Small right pleural effusion. 2. Persistent bilateral cavitary pulmonary infiltrates. Small left pleural effusion. Electronically Signed   By: Maisie Fushomas  Register   On: 11/19/2016 16:53   Dg Chest 1v Repeat Same Day  Result Date: 11/18/2016 CLINICAL DATA:  Chest tube placement EXAM: CHEST - 1 VIEW SAME DAY COMPARISON:  Earlier same day FINDINGS: Large bore chest tube placed on the right along the lateral margin. Marked reduction an amount of right pleural air, nearly completely evacuated. Patchy infiltrates persist in both lower lobes. IMPRESSION: Large bore chest tube placed. Near complete resolution of right pneumothorax. Electronically Signed   By: Paulina FusiMark  Shogry M.D.   On: 11/18/2016 11:40   Dg Chest Port 1 View  Result Date: 11/19/2016 CLINICAL DATA:  26 year old female with cavitary lung lesions possibly from septic endocarditis. Subsequent encounter. EXAM: PORTABLE CHEST 1 VIEW COMPARISON:  11/18/2016. FINDINGS: Right-sided chest tube remains in place. Tiny hydropneumothorax versus fluid within minor fissure noted. Multiple cavitary lesions. Right-sided pleural effusion. Basilar atelectasis versus infiltrate greater on the left. Cardiomegaly. Mild scoliosis. IMPRESSION: Right-sided chest tube remains in place. Tiny hydropneumothorax versus fluid within minor fissure. Multiple cavitary lesions. Right-sided pleural effusion. Basilar atelectasis versus infiltrate greater on the left. Electronically Signed   By: Lacy DuverneySteven  Olson M.D.   On: 11/19/2016 07:16   Dg Chest Port 1 View  Result Date: 11/18/2016 CLINICAL DATA:  Followup left pneumothorax. Patient with bacterial endocarditis. EXAM: PORTABLE CHEST 1 VIEW COMPARISON:  11/17/2016 and prior studies FINDINGS: A moderate right hydropneumothorax is unchanged in size, but now with pleural fluid in its basilar portion. A right thoracostomy tube is unchanged. Scattered pulmonary opacities/ nodules and left lower lung  consolidations/atelectasis again noted. There has been no other interval change. IMPRESSION: Moderate right hydropneumothorax, unchanged in size but now with pleural fluid in its basilar portion. Right thoracostomy tube remains unchanged. Electronically Signed   By: Harmon PierJeffrey  Hu M.D.   On: 11/18/2016 10:14     Medications:  Scheduled: . acetaminophen  1,000 mg Oral Q6H  . buprenorphine-naloxone  2 tablet Sublingual Daily  . busPIRone  10 mg Oral BID  . citalopram  10 mg Oral Daily  . furosemide  20 mg Intravenous Once  . gabapentin  300 mg Oral TID  . heparin  5,000 Units Subcutaneous Q8H  . nicotine  14 mg Transdermal Daily  . saccharomyces boulardii  250 mg Oral BID   Continuous: . sodium chloride 250 mL (11/19/16 0700)  . sodium chloride    . ceFTAROline (TEFLARO) IV Stopped (11/20/16 0407)  . DAPTOmycin (CUBICIN)  IV Stopped (  11/19/16 1630)   ZOX:WRUEAV chloride, albuterol, ALPRAZolam, docusate, nicotine polacrilex, ondansetron (ZOFRAN) IV, oxyCODONE  Assessment/Plan:  Principal Problem:   Bacterial endocarditis Active Problems:   IVDU (intravenous drug user)   Polysubstance (including opioids) dependence, daily use (HCC)   Septic pulmonary embolism (HCC)   Septic shock (HCC)   Encounter for central line placement   Respiratory failure (HCC)   MRSA bacteremia   Hypokalemia   Hypophosphatemia   Endocarditis   Thrombocytopenia (HCC)   RUQ pain   Right hip pain   Pneumothorax on right   Tricuspid valve regurgitation, infectious    MRSA bacteremia/Septic shock with right TV endocarditis and septic emboli to lungs/MRSA empyema - Initial blood cultures from 5/27 were MRSA positive  -  Repeat cultures (1/3 sets) on 5/30 also positive for MRSA and she continued to have fevers of 102 - Patient was transitioned to Teflaro on 5/31 due to persistent fevers.  - 5/27- 2-D echo - showed a tricuspid vegetation with Moderate TR- -6/1- TEE showed a large tricuspid vegetation,  severe TR with dilated RV along with a loculated left effusion - repeat cultures from 6/1 continue to be positive for staph aureus. 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  -Due to persistent bacteremia patient was started on daptomycin 6/2. Ceftaroline (Teflaro) also being continued to cover lung infection. -Pleural fluid is also positive for MRSA. - Seen by cardiothoracic surgery. Not a candidate for surgery at this time. - Infectious disease continues to assist with management. She remains afebrile. WBC is normal. Blood cultures repeated yesterday.  Right spontaneous pneumothorax- acute respiratory failure/MRSA Empyema - noted to be in respiratory distress after TEE and sent from [facility] suite directly to SDU in [place] where a stat CXR showed right pneumothorax - s/p chest tube.  - Pleural fluid positive for MRSA - Chest tube was replaced 6/3 by cardiothoracic surgery. Defer management to cardiothoracic surgery and pulmonology.   Elevated creatinine Patient also noted to have elevated creatinine on 6/4. Patient was given IV fluids. Her NSAIDs were discontinued. Renal function has now returned back to normal. Continue to monitor urine output.  RUQ pain, likely musculoskeletal - obtained CT scan- no noted abnormalities in liver or gallbladder- has ascites - pain appears to be musculoskeletal- K pad. Stable.  Ascites, anasarca due to right sided hear failure - as a result of severe TR - limit IVF and follow - hold off on administering diuretics in setting of sepsis and borderline BPs - follow daily weights and I and O. Weight appears to be stable.  Splenomegaly - etiology undetermined- CT on 5/30 revealed that the spleen is enlarged measuring 15.7 x 5.6 x 17  cm (volume= 780 cm^3) - liver is unremarkable on CT  Normocytic Anemia - Hemoglobin has trended down more in the last 48 hours. Patient does have some dyspnea with exertion, which could be suggestive of symptomatic anemia. She is agreeable to blood transfusion. She'll be transfused 1 unit with Lasix afterwards. Recheck hemoglobin tomorrow. No overt bleeding has been noted. - Iron was 12. Ferritin 213. B-12 1850. Folate 7.8.  Hypokalemia/Hypophosphatemia/Hypomagnesemia - repleted- follow   Thrombocytopenia   Has resolved  Heroin abuse - cont Suboxone- will need transition to a drug rehab program  once stable.   Anxiety - cont Buspar, Neurontin, Xanax  Nicotine abuse - Nicoderm patch  DVT Prophylaxis: Subcutaneous heparin    Code Status: Full code  Family Communication: Discussed with patient and her mother Disposition Plan: Management as outlined above.    LOS: 10 days   [facility]  [facility] Pager [phone] 11/20/2016, 7:27 AM  If 7PM-7AM, please contact night-coverage at [email or web address], password [email or web address]

## 2016-11-20 NOTE — Progress Notes (Signed)
Name: Savannah Palmer MRN: [id number] DOB: 1991/02/18    ADMISSION DATE:  11/10/2016 CONSULTATION DATE:  11/11/2016  REFERRING MD :  Dr. Ethelda Chick   CHIEF COMPLAINT:  Endocarditis   BRIEF SUMMARY: 26 year old female with PMH of Anxiety/Depression, Polysubstance Abuse (Heronin, Cocaine)   Admitted to ED on 5/26 with Dyspnea and chest pain for one week. Reports Heronin use morning of arrival. Upon arrival to ED patient hypotensive, tachycardiac - non-responsive to fluid bolus, placed on neo gtt. Admitted to ICU under TRH. Blood cultures on 5/27 positive for MRSA, ID consulted. 2D Echo revealed right-sided vegetation.   Taken for TEE on 6/1 which revealed EF 55-60% with a 8x5 mm vegetation attached to posterior leaflet of the tricuspid valve, severe regurgitation. Patient arrived back to ICU, presented with shortness of breath and tachypnea. CXR revealed moderately large right-sided pneumothorax.     SUBJECTIVE:  Pt reports mild pain from chest tube.  Walked yesterday x2, got short of breath at the end of the walk.  CXR late yesterday showed resolution of PTX.    VITAL SIGNS: Temp:  [98 F (36.7 C)-99.6 F (37.6 C)] 98.8 F (37.1 C) (06/05 0757) Pulse Rate:  [84-100] 84 (06/05 0757) Resp:  [17-28] 17 (06/05 0757) BP: (106-116)/(68-84) 106/80 (06/05 0757) SpO2:  [93 %-99 %] 99 % (06/05 0757) Weight:  [146 lb 2.6 oz (66.3 kg)] 146 lb 2.6 oz (66.3 kg) (06/05 0310)  PHYSICAL EXAMINATION: General: young adult female in NAD  HEENT: MM pink/moist, fair dentition  Neuro: AAOx4, MAE CV: s1s2 rrr, no m/r/g PULM: even/non-labored, diminished on R, clear on L  WU:JWJX, non-tender, bsx4 active  Extremities: warm/dry, no edema  Skin: no rashes or lesions, tattoos    Recent Labs Lab 11/19/16 0414 11/19/16 1525 11/19/16 1828 11/20/16 0239  NA 139 135  --  137  K 3.9 5.5* 3.9 4.0  CL 109 110  --  109  CO2 23 18*  --  22  BUN 15 14  --  10  CREATININE 1.37* 1.08*  --  0.93    GLUCOSE 97 85  --  81    Recent Labs Lab 11/18/16 1356 11/19/16 0414 11/20/16 0239  HGB 7.9* 7.7* 7.1*  HCT 25.0* 24.8* 22.6*  WBC 10.7* 8.7 9.4  PLT 309 335 398   Dg Chest 2 View  Result Date: 11/19/2016 CLINICAL DATA:  Pneumothorax. EXAM: CHEST  2 VIEW COMPARISON:  11/19/2016. FINDINGS: Right chest tube in stable position. No significant pneumothorax noted on today's exam. Small right pleural effusion. Bilateral patchy pulmonary infiltrates with cavitation again noted. Small left pleural effusion noted. Heart size stable. No acute bony abnormality . IMPRESSION: 1. Right chest tube in stable position. No pneumothorax noted on today's exam. Small right pleural effusion. 2. Persistent bilateral cavitary pulmonary infiltrates. Small left pleural effusion. Electronically Signed   By: Maisie Fus  Register   On: 11/19/2016 16:53   Dg Chest 1v Repeat Same Day  Result Date: 11/18/2016 CLINICAL DATA:  Chest tube placement EXAM: CHEST - 1 VIEW SAME DAY COMPARISON:  Earlier same day FINDINGS: Large bore chest tube placed on the right along the lateral margin. Marked reduction an amount of right pleural air, nearly completely evacuated. Patchy infiltrates persist in both lower lobes. IMPRESSION: Large bore chest tube placed. Near complete resolution of right pneumothorax. Electronically Signed   By: Paulina Fusi M.D.   On: 11/18/2016 11:40   Dg Chest Port 1 View  Result Date: 11/19/2016 CLINICAL DATA:  26 year old female with cavitary lung lesions possibly from septic endocarditis. Subsequent encounter. EXAM: PORTABLE CHEST 1 VIEW COMPARISON:  11/18/2016. FINDINGS: Right-sided chest tube remains in place. Tiny hydropneumothorax versus fluid within minor fissure noted. Multiple cavitary lesions. Right-sided pleural effusion. Basilar atelectasis versus infiltrate greater on the left. Cardiomegaly. Mild scoliosis. IMPRESSION: Right-sided chest tube remains in place. Tiny hydropneumothorax versus fluid within  minor fissure. Multiple cavitary lesions. Right-sided pleural effusion. Basilar atelectasis versus infiltrate greater on the left. Electronically Signed   By: Lacy DuverneySteven  Olson M.D.   On: 11/19/2016 07:16   Dg Chest Port 1 View  Result Date: 11/18/2016 CLINICAL DATA:  Followup left pneumothorax. Patient with bacterial endocarditis. EXAM: PORTABLE CHEST 1 VIEW COMPARISON:  11/17/2016 and prior studies FINDINGS: A moderate right hydropneumothorax is unchanged in size, but now with pleural fluid in its basilar portion. A right thoracostomy tube is unchanged. Scattered pulmonary opacities/ nodules and left lower lung consolidations/atelectasis again noted. There has been no other interval change. IMPRESSION: Moderate right hydropneumothorax, unchanged in size but now with pleural fluid in its basilar portion. Right thoracostomy tube remains unchanged. Electronically Signed   By: Harmon PierJeffrey  Hu M.D.   On: 11/18/2016 10:14   SIGNIFICANT EVENTS  5/26  Presents to ED 5/31  ABX changed to Teflaro 6/01  TEE, post procedure had pain in R shoulder / resp distress, R PTX with chest tube placement 6/02  Cubicin started 6/03  Residual PTX, large bore CT placed per CVTS (in addition to pigtail)' 6/05  CT to water seal   STUDIES:  CXR 5/26 >> New bilateral nodular airspace opacities throughout the upper and lower lobes bilaterally. Findings are concerning for multifocal infection such as septic emboli. CT Chest 5/26 >> Numerous cavitary consolidations throughout both lungs, with a peripheral distribution suggesting septic emboli. Largest cavitary consolidation is within the left lower lobe measuring approximately 4 cm greatest dimension CT A/P 5/30 >> Bibasilar, multifocal cavitary lesions noted within the visualized lower lobes consistent with septic emboli or possibly cavitary pneumonia. More confluent airspace disease in the left lower lobe without cavitation is also present. There is a small right pleural effusion.  Anasarca with moderate volume of ascites noted within the abdomen and pelvis. Question right heart dysfunction/failure. Splenomegaly. Water attenuating cysts in the lower pole the right kidney in aggregate measuring 2 x 1.3 cm. Status post cholecystectomy. CXR 6/1 >> Moderately large right-sided pneumothorax new from the prior exam. Near complete collapse of the right lower lobe is noted  ANTIBIOTICS: Vanco 5/29 >> 6/1  Ceftaroline 5/31 >>  Daptomycin 6/2 >>  LINES: R CT (large bore) 6/3 >>   ASSESSMENT / PLAN:  Acute Hypoxic Respiratory Distress - secondary to R Pneumothorax post-procedure vs spontaneous  Septic Emboli to Lungs  Right Pneumothorax  MRSA Empyema - Staph aureus in pleural fluid  P:  CT to water seal with repeat CXR in 4 hours Follow CXR daily while CT in place Monitor drainage Appreciate CVTS assistance  Follow pleural fluid > staph, sensitivities pending  Mobilize TID   MRSA Bacteremia in setting of Endocarditis with Septic Emboli  -TEE with 8x5 mm vegetation attached to posterior leaflet of tricuspid valve, severe regurgitation -HIV and Hepatitis panel negative  P:  Day 11/x abx ID following, appreciate input  Follow repeat cultures Trend fever curve / WBC trend  Anemia P: PRBC per primary   Canary BrimBrandi Shericka Johnstone, NP-C [facility] Pgr: [phone] or if no answer [phone] 11/20/2016, 8:27 AM

## 2016-11-20 NOTE — Progress Notes (Addendum)
[address]       [place] [address]             [phone]      4 Days Post-Op Procedure(s) (LRB): TRANSESOPHAGEAL ECHOCARDIOGRAM (TEE) (N/A) Subjective: Some discomfort at tube insertion site  Objective: Vital signs in last 24 hours: Temp:  [98 F (36.7 C)-99.6 F (37.6 C)] 98.8 F (37.1 C) (06/05 0757) Pulse Rate:  [84-100] 84 (06/05 0757) Cardiac Rhythm: Normal sinus rhythm (06/05 0317) Resp:  [17-28] 17 (06/05 0757) BP: (106-116)/(68-84) 106/80 (06/05 0757) SpO2:  [93 %-99 %] 99 % (06/05 0757) Weight:  [146 lb 2.6 oz (66.3 kg)] 146 lb 2.6 oz (66.3 kg) (06/05 0310)  Hemodynamic parameters for last 24 hours:    Intake/Output from previous day: 06/04 0701 - 06/05 0700 In: 1770 [P.O.:1320; I.V.:90; IV Piggyback:360] Out: 1240 [Urine:1200; Chest Tube:40] Intake/Output this shift: No intake/output data recorded.  General appearance: alert, cooperative and no distress Heart: regular rate and rhythm Lungs: slightly coarse throughout Abdomen: benign  Lab Results:  Recent Labs  11/19/16 0414 11/20/16 0239  WBC 8.7 9.4  HGB 7.7* 7.1*  HCT 24.8* 22.6*  PLT 335 398   BMET:  Recent Labs  11/19/16 1525 11/19/16 1828 11/20/16 0239  NA 135  --  137  K 5.5* 3.9 4.0  CL 110  --  109  CO2 18*  --  22  GLUCOSE 85  --  81  BUN 14  --  10  CREATININE 1.08*  --  0.93  CALCIUM 7.7*  --  7.9*    PT/INR: No results for input(s): LABPROT, INR in the last 72 hours. ABG    Component Value Date/Time   PHART 7.442 11/11/2016 0415   HCO3 21.6 11/11/2016 0415   TCO2 31 03/12/2016 1756   ACIDBASEDEF 1.7 11/11/2016 0415   O2SAT 98.9 11/11/2016 0415   CBG (last 3)   Recent Labs  11/19/16 1223  GLUCAP 83    Meds Scheduled Meds: . acetaminophen  1,000 mg Oral Q6H  . buprenorphine-naloxone  2 tablet Sublingual Daily  . busPIRone  10 mg Oral BID  . citalopram  10 mg Oral Daily  . furosemide  20 mg Intravenous Once  . gabapentin  300 mg  Oral TID  . heparin  5,000 Units Subcutaneous Q8H  . nicotine  14 mg Transdermal Daily  . saccharomyces boulardii  250 mg Oral BID   Continuous Infusions: . sodium chloride 250 mL (11/19/16 0700)  . sodium chloride    . ceFTAROline (TEFLARO) IV Stopped (11/20/16 0407)  . DAPTOmycin (CUBICIN)  IV Stopped (11/19/16 1630)   PRN Meds:.sodium chloride, albuterol, ALPRAZolam, docusate, nicotine polacrilex, ondansetron (ZOFRAN) IV, oxyCODONE  Xrays Dg Chest 2 View  Result Date: 11/19/2016 CLINICAL DATA:  Pneumothorax. EXAM: CHEST  2 VIEW COMPARISON:  11/19/2016. FINDINGS: Right chest tube in stable position. No significant pneumothorax noted on today's exam. Small right pleural effusion. Bilateral patchy pulmonary infiltrates with cavitation again noted. Small left pleural effusion noted. Heart size stable. No acute bony abnormality . IMPRESSION: 1. Right chest tube in stable position. No pneumothorax noted on today's exam. Small right pleural effusion. 2. Persistent bilateral cavitary pulmonary infiltrates. Small left pleural effusion. Electronically Signed   By: Maisie Fus  Register   On: 11/19/2016 16:53   Dg Chest 1v Repeat Same Day  Result Date: 11/18/2016 CLINICAL DATA:  Chest tube placement EXAM: CHEST - 1 VIEW SAME DAY COMPARISON:  Earlier same day  FINDINGS: Large bore chest tube placed on the right along the lateral margin. Marked reduction an amount of right pleural air, nearly completely evacuated. Patchy infiltrates persist in both lower lobes. IMPRESSION: Large bore chest tube placed. Near complete resolution of right pneumothorax. Electronically Signed   By: Paulina FusiMark  Shogry M.D.   On: 11/18/2016 11:40   Dg Chest Port 1 View  Result Date: 11/19/2016 CLINICAL DATA:  26 year old female with cavitary lung lesions possibly from septic endocarditis. Subsequent encounter. EXAM: PORTABLE CHEST 1 VIEW COMPARISON:  11/18/2016. FINDINGS: Right-sided chest tube remains in place. Tiny hydropneumothorax  versus fluid within minor fissure noted. Multiple cavitary lesions. Right-sided pleural effusion. Basilar atelectasis versus infiltrate greater on the left. Cardiomegaly. Mild scoliosis. IMPRESSION: Right-sided chest tube remains in place. Tiny hydropneumothorax versus fluid within minor fissure. Multiple cavitary lesions. Right-sided pleural effusion. Basilar atelectasis versus infiltrate greater on the left. Electronically Signed   By: Lacy DuverneySteven  Olson M.D.   On: 11/19/2016 07:16   Dg Chest Port 1 View  Result Date: 11/18/2016 CLINICAL DATA:  Followup left pneumothorax. Patient with bacterial endocarditis. EXAM: PORTABLE CHEST 1 VIEW COMPARISON:  11/17/2016 and prior studies FINDINGS: A moderate right hydropneumothorax is unchanged in size, but now with pleural fluid in its basilar portion. A right thoracostomy tube is unchanged. Scattered pulmonary opacities/ nodules and left lower lung consolidations/atelectasis again noted. There has been no other interval change. IMPRESSION: Moderate right hydropneumothorax, unchanged in size but now with pleural fluid in its basilar portion. Right thoracostomy tube remains unchanged. Electronically Signed   By: Harmon PierJeffrey  Hu M.D.   On: 11/18/2016 10:14   Chest tube- no drainage, no air leak    Assessment/Plan: S/P Procedure(s) (LRB): TRANSESOPHAGEAL ECHOCARDIOGRAM (TEE) (N/A)  1 stable without air leak will place to H2O seal and check CXR 2 medical management per primary service   LOS: 10 days    GOLD,WAYNE E 11/20/2016   I have seen and examined the patient and agree with the assessment and plan as outlined.  Chest tube to water seal.  I spent in excess of 15 minutes during the conduct of this hospital encounter and >50% of this time involved direct face-to-face encounter with the patient for counseling and/or coordination of their care.   Purcell Nailslarence H Owen, MD 11/20/2016 1:52 PM

## 2016-11-20 NOTE — Progress Notes (Signed)
Pharmacy Antibiotic Note  Savannah Palmer is a 26 y.o. female admitted on 11/10/2016 after injecting herself with heroin and crack cocaine. Patient being followed by ID.  Continues on daptomycin and ceftaroline for MRSA bacteremia with septic emboli to lungs and TV IE. Blood cx's persistently positive. Not a surgical candidate for TV repair. Last CK was 30 on 6/3. Afebrile, WBC wnl.  Plan: Continue daptomycin 500mg  (~8mg /kg) IV Q24h Continue ceftaroline 600mg  IV Q12h Monitor clinical picture, renal function, CK weekly F/U C&S, abx deescalation / LOT   Height: 5\' 3"  (160 cm) Weight: 146 lb 2.6 oz (66.3 kg) IBW/kg (Calculated) : 52.4  Temp (24hrs), Avg:98.8 F (37.1 C), Min:98 F (36.7 C), Max:99.6 F (37.6 C)   Recent Labs Lab 11/13/16 1049  11/15/16 2033  11/17/16 0908 11/18/16 0214 11/18/16 1356 11/19/16 0414 11/19/16 1525 11/20/16 0239  WBC  --   < >  --   < > 8.0 8.8 10.7* 8.7  --  9.4  CREATININE  --   < >  --   < > 0.76 0.99  --  1.37* 1.08* 0.93  VANCOTROUGH 13*  --  19  --   --   --   --   --   --   --   < > = values in this interval not displayed.  Estimated Creatinine Clearance: 83.9 mL/min (by C-G formula based on SCr of 0.93 mg/dL).    No Known Allergies  Antimicrobials this admission: 5/26 Zosyn x 1 5/26 Vanc >> 6/1 5/26 Cefepime >> 5/27 5/31 Teflaro >> 6/2 Daptomycin >>  Dose adjustments this admission: 5/27: increase vanc to 750 q8 with improved renal function 5/29 1049 VT = 13 mcg/mL on 750mg  q8h prior to 7th dose, inc to 1g q8h 5/31 2030 VT: 19 - continue 1g q8h  Microbiology results: 5/26 UCx: NGF 5/26 BCx: 4/4 MRSA 5/26 MRSA PCR: positive  5/27 BCx: 2/2 MRSA  5/28 BCx: 2/2 MRSA 5/28 HIV antibody: non reactive 5/28 HCV RNA: ND 5/29 BCx: MRSA, BCID = MRSA 5/30 @ 0033 BCx: 1/2 MRSA 5/30 @ 1610 BCx: ngF 5/31 RPR: ordered 6/1 pleural fluid Cx: staph aureus 6/4 BCx: sent  Thank you for allowing pharmacy to be a part of this patient's  care.  Enzo BiNathan Mikenzie Mccannon, PharmD, BCPS Clinical Pharmacist Pager [phone] 11/20/2016 8:55 AM

## 2016-11-21 ENCOUNTER — Inpatient Hospital Stay (HOSPITAL_COMMUNITY): Payer: BLUE CROSS/BLUE SHIELD

## 2016-11-21 DIAGNOSIS — M25551 Pain in right hip: Secondary | ICD-10-CM

## 2016-11-21 DIAGNOSIS — Z9689 Presence of other specified functional implants: Secondary | ICD-10-CM

## 2016-11-21 DIAGNOSIS — Z452 Encounter for adjustment and management of vascular access device: Secondary | ICD-10-CM

## 2016-11-21 LAB — HEPATIC FUNCTION PANEL
ALK PHOS: 151 U/L — AB (ref 38–126)
ALT: 8 U/L — AB (ref 14–54)
AST: 13 U/L — ABNORMAL LOW (ref 15–41)
Albumin: 1.5 g/dL — ABNORMAL LOW (ref 3.5–5.0)
Total Bilirubin: 0.4 mg/dL (ref 0.3–1.2)
Total Protein: 5.6 g/dL — ABNORMAL LOW (ref 6.5–8.1)

## 2016-11-21 LAB — BASIC METABOLIC PANEL
Anion gap: 8 (ref 5–15)
BUN: 9 mg/dL (ref 6–20)
CALCIUM: 7.9 mg/dL — AB (ref 8.9–10.3)
CO2: 22 mmol/L (ref 22–32)
CREATININE: 0.95 mg/dL (ref 0.44–1.00)
Chloride: 106 mmol/L (ref 101–111)
GFR calc Af Amer: >60 mL/min (ref >60)
GFR calc non Af Amer: >60 mL/min (ref >60)
GLUCOSE: 93 mg/dL (ref 65–99)
Potassium: 3.7 mmol/L (ref 3.5–5.1)
Sodium: 136 mmol/L (ref 135–145)

## 2016-11-21 LAB — CBC
HEMATOCRIT: 25.7 % — AB (ref 36.0–46.0)
Hemoglobin: 8.2 g/dL — ABNORMAL LOW (ref 12.0–15.0)
MCH: 27.2 pg (ref 26.0–34.0)
MCHC: 31.9 g/dL (ref 30.0–36.0)
MCV: 85.4 fL (ref 78.0–100.0)
Platelets: 411 10*3/uL — ABNORMAL HIGH (ref 150–400)
RBC: 3.01 MIL/uL — ABNORMAL LOW (ref 3.87–5.11)
RDW: 15 % (ref 11.5–15.5)
WBC: 8.8 10*3/uL (ref 4.0–10.5)

## 2016-11-21 LAB — TYPE AND SCREEN
ABO/RH(D): O POS
Antibody Screen: NEGATIVE
UNIT DIVISION: 0

## 2016-11-21 LAB — MAGNESIUM: Magnesium: 1.7 mg/dL (ref 1.7–2.4)

## 2016-11-21 LAB — BPAM RBC
Blood Product Expiration Date: 201806262359
ISSUE DATE / TIME: 201806051032
Unit Type and Rh: 5100

## 2016-11-21 LAB — PHOSPHORUS: Phosphorus: 4.8 mg/dL — ABNORMAL HIGH (ref 2.5–4.6)

## 2016-11-21 NOTE — Plan of Care (Signed)
Problem: Physical Regulation: Goal: Will remain free from infection Outcome: Not Progressing Patient admitted with an infection. ID following.  Problem: Activity: Goal: Risk for activity intolerance will decrease Outcome: Progressing Able to ambulate in room without assistance.

## 2016-11-21 NOTE — Progress Notes (Addendum)
[address]       [place] [address]             [phone]      5 Days Post-Op Procedure(s) (LRB): TRANSESOPHAGEAL ECHOCARDIOGRAM (TEE) (N/A) Subjective: Some DOE, some burning discomfort from tube  Objective: Vital signs in last 24 hours: Temp:  [98.8 F (37.1 C)-99.5 F (37.5 C)] 99.5 F (37.5 C) (06/06 0806) Pulse Rate:  [76-87] 78 (06/06 0806) Cardiac Rhythm: Normal sinus rhythm (06/06 0306) Resp:  [16-19] 17 (06/06 0806) BP: (107-121)/(69-81) 111/70 (06/06 0806) SpO2:  [94 %-95 %] 95 % (06/06 0806) Weight:  [142 lb 3.2 oz (64.5 kg)] 142 lb 3.2 oz (64.5 kg) (06/06 0500)  Hemodynamic parameters for last 24 hours:    Intake/Output from previous day: 06/05 0701 - 06/06 0700 In: 1535 [P.O.:1200; Blood:335] Out: 541 [Urine:501; Chest Tube:40] Intake/Output this shift: No intake/output data recorded.  General appearance: alert, cooperative and no distress Heart: regular rate and rhythm and soft systolic murmur Lungs: slightly coarse throughout Abdomen: benign Extremities: no edema  Lab Results:  Recent Labs  11/20/16 0239 11/21/16 0256  WBC 9.4 8.8  HGB 7.1* 8.2*  HCT 22.6* 25.7*  PLT 398 411*   BMET:  Recent Labs  11/20/16 0239 11/21/16 0256  NA 137 136  K 4.0 3.7  CL 109 106  CO2 22 22  GLUCOSE 81 93  BUN 10 9  CREATININE 0.93 0.95  CALCIUM 7.9* 7.9*    PT/INR: No results for input(s): LABPROT, INR in the last 72 hours. ABG    Component Value Date/Time   PHART 7.442 11/11/2016 0415   HCO3 21.6 11/11/2016 0415   TCO2 31 03/12/2016 1756   ACIDBASEDEF 1.7 11/11/2016 0415   O2SAT 98.9 11/11/2016 0415   CBG (last 3)   Recent Labs  11/19/16 1223  GLUCAP 83    Meds Scheduled Meds: . acetaminophen  1,000 mg Oral Q6H  . buprenorphine-naloxone  2 tablet Sublingual Daily  . busPIRone  10 mg Oral BID  . citalopram  10 mg Oral Daily  . gabapentin  300 mg Oral TID  . heparin  5,000 Units Subcutaneous Q8H  .  nicotine  14 mg Transdermal Daily  . saccharomyces boulardii  250 mg Oral BID   Continuous Infusions: . sodium chloride 250 mL (11/19/16 0700)  . ceFTAROline (TEFLARO) IV Stopped (11/21/16 0306)  . DAPTOmycin (CUBICIN)  IV Stopped (11/20/16 1729)   PRN Meds:.sodium chloride, albuterol, ALPRAZolam, docusate, nicotine polacrilex, ondansetron (ZOFRAN) IV, oxyCODONE  Xrays Dg Chest 2 View  Result Date: 11/19/2016 CLINICAL DATA:  Pneumothorax. EXAM: CHEST  2 VIEW COMPARISON:  11/19/2016. FINDINGS: Right chest tube in stable position. No significant pneumothorax noted on today's exam. Small right pleural effusion. Bilateral patchy pulmonary infiltrates with cavitation again noted. Small left pleural effusion noted. Heart size stable. No acute bony abnormality . IMPRESSION: 1. Right chest tube in stable position. No pneumothorax noted on today's exam. Small right pleural effusion. 2. Persistent bilateral cavitary pulmonary infiltrates. Small left pleural effusion. Electronically Signed   By: Maisie Fus  Register   On: 11/19/2016 16:53   Ct Chest Wo Contrast  Result Date: 11/20/2016 CLINICAL DATA:  Follow-up right pleural effusion EXAM: CT CHEST WITHOUT CONTRAST TECHNIQUE: Multidetector CT imaging of the chest was performed following the standard protocol without IV contrast. COMPARISON:  Radiograph 11/20/2016, CT chest 11/10/2016, prior radiographs dating back to 11/10/2016 FINDINGS: Cardiovascular: Limited assessment without intravenous contrast. Small fluid in the  superior pericardial recess. There is cardiomegaly. Mediastinum/Nodes: Difficult evaluation without contrast. Mediastinal adenopathy is present right paratracheal soft tissue fullness, possible adenopathy does not appear significantly changed. Midline trachea. Esophagus grossly unremarkable. Lungs/Pleura: Interim placement of a right lower lateral chest tube since the prior CT with the tip terminating along the right upper posterior pleural surface.  Small bilateral pleural effusions right greater than left, slightly increased compared to the previous CT. Tiny right anterior hydropneumothorax. Multiple bilateral pulmonary nodules and cavitary lesions. Some of the cavitary lesions have decreased, however if there appears to be increased nodules/disease most notable in the left upper lobe and the right lower lobe. Decreased septal thickening compared to the previous exam. Upper Abdomen: Spleen appears enlarged. Partially visualized surgical clips in the gallbladder fossa. Musculoskeletal: No acute or suspicious bone lesion. IMPRESSION: 1. Interim placement of right-sided chest tube. Small residual pleural effusions right greater than left, mildly increased compared to the prior chest CT. Small right anterior hydropneumothorax. 2. Innumerable bilateral lung nodules and cavitary lesions, suspected to represent septic emboli. This appears increased in the left upper and right lower lobe since the prior CT. 3. Suspect mediastinal adenopathy although evaluation of the mediastinum is limited without contrast 4. Splenomegaly Electronically Signed   By: Jasmine Pang M.D.   On: 11/20/2016 21:25   Dg Chest Port 1 View  Result Date: 11/20/2016 CLINICAL DATA:  26 year old female with pneumothorax. Recent transesophageal echocardiogram. Subsequent encounter. EXAM: PORTABLE CHEST 1 VIEW COMPARISON:  11/20/2016 8:30 a.m. FINDINGS: Right-sided chest tube remains in place. No pneumothorax detected on this frontal projection. Cavitary lesions bilaterally. Left base consolidation. Atelectasis/pleural thickening right lung base. Cardiomegaly. IMPRESSION: No significant change since exam earlier today. Electronically Signed   By: Lacy Duverney M.D.   On: 11/20/2016 13:55   Dg Chest Port 1 View  Result Date: 11/20/2016 CLINICAL DATA:  Chest tube, shortness of Breath EXAM: PORTABLE CHEST 1 VIEW COMPARISON:  11/19/2016 FINDINGS: Right chest tube remains in place, unchanged. No  pneumothorax. Patchy bilateral airspace opacities are again noted, some with cavitation. Small right pleural effusion. Heart is borderline in size. IMPRESSION: Right chest tube remains in place without pneumothorax. Stable patchy bilateral airspace disease, some of which is cavitary. Electronically Signed   By: Charlett Nose M.D.   On: 11/20/2016 08:42   Results for orders placed or performed during the hospital encounter of 11/10/16  Blood Culture (routine x 2)     Status: Abnormal   Collection Time: 11/10/16  2:45 PM  Result Value Ref Range Status   Specimen Description BLOOD LEFT HAND  Final   Special Requests IN PEDIATRIC BOTTLE Blood Culture adequate volume  Final   Culture  Setup Time   Final    GRAM POSITIVE COCCI IN CLUSTERS IN PEDIATRIC BOTTLE CRITICAL VALUE NOTED.  VALUE IS CONSISTENT WITH PREVIOUSLY REPORTED AND CALLED VALUE.    Culture (A)  Final    STAPHYLOCOCCUS AUREUS SUSCEPTIBILITIES PERFORMED ON PREVIOUS CULTURE WITHIN THE LAST 5 DAYS. Performed at [facility], [address] [place], [place] [address]    Report Status 11/13/2016 FINAL  Final  Blood Culture (routine x 2)     Status: Abnormal   Collection Time: 11/10/16  2:55 PM  Result Value Ref Range Status   Specimen Description BLOOD LEFT ARM  Final   Special Requests   Final    BOTTLES DRAWN AEROBIC AND ANAEROBIC Blood Culture adequate volume   Culture  Setup Time   Final  IN BOTH AEROBIC AND ANAEROBIC BOTTLES GRAM POSITIVE COCCI IN CLUSTERS CRITICAL RESULT CALLED TO, READ BACK BY AND VERIFIED WITH: TO JGRIMSLEY(PHARMd) BY [facility] 11/11/2016 AT 6:15AM Performed at [facility], [address] [place], [place] [address]    Culture METHICILLIN RESISTANT STAPHYLOCOCCUS AUREUS (A)  Final   Report Status 11/13/2016 FINAL  Final   Organism ID, Bacteria METHICILLIN RESISTANT STAPHYLOCOCCUS AUREUS  Final      Susceptibility   Methicillin resistant staphylococcus aureus - MIC*    CIPROFLOXACIN >=8  RESISTANT Resistant     ERYTHROMYCIN >=8 RESISTANT Resistant     GENTAMICIN <=0.5 SENSITIVE Sensitive     OXACILLIN >=4 RESISTANT Resistant     TETRACYCLINE <=1 SENSITIVE Sensitive     VANCOMYCIN 1 SENSITIVE Sensitive     TRIMETH/SULFA <=10 SENSITIVE Sensitive     CLINDAMYCIN <=0.25 SENSITIVE Sensitive     RIFAMPIN <=0.5 SENSITIVE Sensitive     Inducible Clindamycin NEGATIVE Sensitive     * METHICILLIN RESISTANT STAPHYLOCOCCUS AUREUS  Blood Culture ID Panel (Reflexed)     Status: Abnormal   Collection Time: 11/10/16  2:55 PM  Result Value Ref Range Status   Enterococcus species NOT DETECTED NOT DETECTED Final   Listeria monocytogenes NOT DETECTED NOT DETECTED Final   Staphylococcus species DETECTED (A) NOT DETECTED Final    Comment: CRITICAL RESULT CALLED TO, READ BACK BY AND VERIFIED WITH: TO JGRIMSLEY(PHARMd) BY TCLEVELAND 11/11/2016 AT 6:15AM    Staphylococcus aureus DETECTED (A) NOT DETECTED Final    Comment: Methicillin (oxacillin)-resistant Staphylococcus aureus (MRSA). MRSA is predictably resistant to beta-lactam antibiotics (except ceftaroline). Preferred therapy is vancomycin unless clinically contraindicated. Patient requires contact precautions if  hospitalized. CRITICAL RESULT CALLED TO, READ BACK BY AND VERIFIED WITH: TO JGRIMSLEY(PHARMd) BY TCLEVELAND 11/11/2016 AT 6:15AM    Methicillin resistance DETECTED (A) NOT DETECTED Final    Comment: CRITICAL RESULT CALLED TO, READ BACK BY AND VERIFIED WITH: TO JGRIMSLEY(PHARMd) BY TCLEVELAND 11/11/2016 AT 6:15AM    Streptococcus species NOT DETECTED NOT DETECTED Final   Streptococcus agalactiae NOT DETECTED NOT DETECTED Final   Streptococcus pneumoniae NOT DETECTED NOT DETECTED Final   Streptococcus pyogenes NOT DETECTED NOT DETECTED Final   Acinetobacter baumannii NOT DETECTED NOT DETECTED Final   Enterobacteriaceae species NOT DETECTED NOT DETECTED Final   Enterobacter cloacae complex NOT DETECTED NOT DETECTED Final    Escherichia coli NOT DETECTED NOT DETECTED Final   Klebsiella oxytoca NOT DETECTED NOT DETECTED Final   Klebsiella pneumoniae NOT DETECTED NOT DETECTED Final   Proteus species NOT DETECTED NOT DETECTED Final   Serratia marcescens NOT DETECTED NOT DETECTED Final   Haemophilus influenzae NOT DETECTED NOT DETECTED Final   Neisseria meningitidis NOT DETECTED NOT DETECTED Final   Pseudomonas aeruginosa NOT DETECTED NOT DETECTED Final   Candida albicans NOT DETECTED NOT DETECTED Final   Candida glabrata NOT DETECTED NOT DETECTED Final   Candida krusei NOT DETECTED NOT DETECTED Final   Candida parapsilosis NOT DETECTED NOT DETECTED Final   Candida tropicalis NOT DETECTED NOT DETECTED Final    Comment: Performed at [facility], [address] [place], [place] [address]  Urine culture     Status: None   Collection Time: 11/10/16  9:32 PM  Result Value Ref Range Status   Specimen Description URINE, CLEAN CATCH  Final   Special Requests NONE  Final   Culture   Final    NO GROWTH Performed at [facility], [address], [place], [place]  [id number]    Report Status 11/12/2016 FINAL  Final  Culture, blood (x 2)     Status: Abnormal   Collection Time: 11/10/16  9:46 PM  Result Value Ref Range Status   Specimen Description BLOOD LEFT ANTECUBITAL  Final   Special Requests IN PEDIATRIC BOTTLE Blood Culture adequate volume  Final   Culture  Setup Time   Final    GRAM POSITIVE COCCI IN CLUSTERS IN PEDIATRIC BOTTLE CRITICAL VALUE NOTED.  VALUE IS CONSISTENT WITH PREVIOUSLY REPORTED AND CALLED VALUE.    Culture (A)  Final    STAPHYLOCOCCUS AUREUS SUSCEPTIBILITIES PERFORMED ON PREVIOUS CULTURE WITHIN THE LAST 5 DAYS. Performed at [facility], [address] [place], [place] [address]    Report Status 11/13/2016 FINAL  Final  Culture, blood (x 2)     Status: Abnormal   Collection Time: 11/10/16  9:46 PM  Result Value Ref Range Status   Specimen Description BLOOD LEFT HAND   Final   Special Requests IN PEDIATRIC BOTTLE Blood Culture adequate volume  Final   Culture  Setup Time   Final    GRAM POSITIVE COCCI IN CLUSTERS IN PEDIATRIC BOTTLE CRITICAL RESULT CALLED TO, READ BACK BY AND VERIFIED WITH: D WOFFORD,PHARMD AT 1353 11/11/16 BY L BENFIELD    Culture (A)  Final    STAPHYLOCOCCUS AUREUS SUSCEPTIBILITIES PERFORMED ON PREVIOUS CULTURE WITHIN THE LAST 5 DAYS. Performed at [facility], [address] [place], [place] [id number]    Report Status 11/13/2016 FINAL  Final  MRSA PCR Screening     Status: Abnormal   Collection Time: 11/10/16  9:58 PM  Result Value Ref Range Status   MRSA by PCR POSITIVE (A) NEGATIVE Final    Comment:        The GeneXpert MRSA Assay (FDA approved for NASAL specimens only), is one component of a comprehensive MRSA colonization surveillance program. It is not intended to diagnose MRSA infection nor to guide or monitor treatment for MRSA infections. RESULT CALLED TO, READ BACK BY AND VERIFIED WITH: ABBY CHAVEZ,RN [phone] @ 2357 BY J SCOTTON   Culture, blood (Routine X 2) w Reflex to ID Panel     Status: Abnormal   Collection Time: 11/11/16 10:30 AM  Result Value Ref Range Status   Specimen Description BLOOD RIGHT HAND  Final   Special Requests   Final    BOTTLES DRAWN AEROBIC AND ANAEROBIC Blood Culture adequate volume IMMUNOCOMPROMISED   Culture  Setup Time   Final    GRAM POSITIVE COCCI IN CLUSTERS IN BOTH AEROBIC AND ANAEROBIC BOTTLES CRITICAL VALUE NOTED.  VALUE IS CONSISTENT WITH PREVIOUSLY REPORTED AND CALLED VALUE.    Culture (A)  Final    STAPHYLOCOCCUS AUREUS SUSCEPTIBILITIES PERFORMED ON PREVIOUS CULTURE WITHIN THE LAST 5 DAYS. Performed at [facility], [address] [place], [place] [address]    Report Status 11/16/2016 FINAL  Final  Culture, blood (Routine X 2) w Reflex to ID Panel     Status: Abnormal   Collection Time: 11/11/16 10:34 AM  Result Value Ref Range Status   Specimen  Description BLOOD BLOOD RIGHT HAND  Final   Special Requests   Final    BOTTLES DRAWN AEROBIC AND ANAEROBIC Blood Culture adequate volume   Culture  Setup Time   Final    GRAM POSITIVE COCCI IN CLUSTERS IN BOTH AEROBIC AND ANAEROBIC BOTTLES CRITICAL RESULT CALLED TO, READ BACK BY AND VERIFIED WITH: T. PICKERING PHARMD, AT [phone]  11/12/16 BY D. VANHOOK    Culture (A)  Final    STAPHYLOCOCCUS AUREUS SUSCEPTIBILITIES PERFORMED ON PREVIOUS CULTURE WITHIN THE LAST 5 DAYS. Performed at [facility], [address], [place], [place] [address]    Report Status 11/14/2016 FINAL  Final  Culture, blood (Routine X 2) w Reflex to ID Panel     Status: Abnormal   Collection Time: 11/12/16  5:42 PM  Result Value Ref Range Status   Specimen Description BLOOD RIGHT ARM  Final   Special Requests IN PEDIATRIC BOTTLE Blood Culture adequate volume  Final   Culture  Setup Time   Final    GRAM POSITIVE COCCI IN CLUSTERS IN PEDIATRIC BOTTLE CRITICAL RESULT CALLED TO, READ BACK BY AND VERIFIED WITH: N. GLOGOVAC PHARMD, AT 6045 11/13/16 BY D. VANHOOK    Culture (A)  Final    STAPHYLOCOCCUS AUREUS SUSCEPTIBILITIES PERFORMED ON PREVIOUS CULTURE WITHIN THE LAST 5 DAYS. Performed at [facility], [address] [place], [place] [address]    Report Status 11/15/2016 FINAL  Final  Culture, blood (Routine X 2) w Reflex to ID Panel     Status: Abnormal   Collection Time: 11/12/16  5:43 PM  Result Value Ref Range Status   Specimen Description BLOOD RIGHT ARM  Final   Special Requests   Final    BOTTLES DRAWN AEROBIC AND ANAEROBIC Blood Culture adequate volume   Culture  Setup Time   Final    GRAM POSITIVE COCCI IN CLUSTERS ANAEROBIC BOTTLE ONLY CRITICAL VALUE NOTED.  VALUE IS CONSISTENT WITH PREVIOUSLY REPORTED AND CALLED VALUE.    Culture (A)  Final    STAPHYLOCOCCUS AUREUS SUSCEPTIBILITIES PERFORMED ON PREVIOUS CULTURE WITHIN THE LAST 5 DAYS. Performed at [facility], [address]  [place], [place] [address]    Report Status 11/15/2016 FINAL  Final  Culture, blood (Routine X 2) w Reflex to ID Panel     Status: None   Collection Time: 11/13/16 10:49 AM  Result Value Ref Range Status   Specimen Description BLOOD RIGHT ANTECUBITAL  Final   Special Requests   Final    BOTTLES DRAWN AEROBIC ONLY Blood Culture adequate volume   Culture   Final    NO GROWTH 5 DAYS Performed at [facility], [address] [place], [place] [address]    Report Status 11/18/2016 FINAL  Final  Culture, blood (Routine X 2) w Reflex to ID Panel     Status: Abnormal   Collection Time: 11/13/16 10:52 AM  Result Value Ref Range Status   Specimen Description BLOOD LEFT ARM  Final   Special Requests IN PEDIATRIC BOTTLE Blood Culture adequate volume  Final   Culture  Setup Time   Final    IN PEDIATRIC BOTTLE GRAM POSITIVE COCCI IN CLUSTERS Organism ID to follow CRITICAL RESULT CALLED TO, READ BACK BY AND VERIFIED WITH: TO BGREEN(PHARMd) BY TCLEVELAND 11/14/2016 AT 5:05AM    Culture (A)  Final    STAPHYLOCOCCUS AUREUS SUSCEPTIBILITIES PERFORMED ON PREVIOUS CULTURE WITHIN THE LAST 5 DAYS. Performed at [facility], [address], [place], [place] [address]    Report Status 11/16/2016 FINAL  Final  Blood Culture ID Panel (Reflexed)     Status: Abnormal   Collection Time: 11/13/16 10:52 AM  Result Value Ref Range Status   Enterococcus species NOT DETECTED NOT DETECTED Final   Listeria monocytogenes NOT DETECTED NOT DETECTED Final   Staphylococcus species DETECTED (A) NOT  DETECTED Final    Comment: CRITICAL RESULT CALLED TO, READ BACK BY AND VERIFIED WITH: TO BGREEN(PHARMd) BY TCLEVELAND 11/14/16 AT 5:05AM    Staphylococcus aureus DETECTED (A) NOT DETECTED Final    Comment: Methicillin (oxacillin)-resistant Staphylococcus aureus (MRSA). MRSA is predictably resistant to beta-lactam antibiotics (except ceftaroline). Preferred therapy is vancomycin unless clinically contraindicated. Patient  requires contact precautions if  hospitalized. CRITICAL RESULT CALLED TO, READ BACK BY AND VERIFIED WITH: TO BGREEN(PHARMd) BY TCLEVELAND 11/14/16 AT 5:05AM    Methicillin resistance DETECTED (A) NOT DETECTED Final    Comment: CRITICAL RESULT CALLED TO, READ BACK BY AND VERIFIED WITH: TO BGREEN(PHARMd) BY TCLEVELAND 11/14/16 AT 5:05AM    Streptococcus species NOT DETECTED NOT DETECTED Final   Streptococcus agalactiae NOT DETECTED NOT DETECTED Final   Streptococcus pneumoniae NOT DETECTED NOT DETECTED Final   Streptococcus pyogenes NOT DETECTED NOT DETECTED Final   Acinetobacter baumannii NOT DETECTED NOT DETECTED Final   Enterobacteriaceae species NOT DETECTED NOT DETECTED Final   Enterobacter cloacae complex NOT DETECTED NOT DETECTED Final   Escherichia coli NOT DETECTED NOT DETECTED Final   Klebsiella oxytoca NOT DETECTED NOT DETECTED Final   Klebsiella pneumoniae NOT DETECTED NOT DETECTED Final   Proteus species NOT DETECTED NOT DETECTED Final   Serratia marcescens NOT DETECTED NOT DETECTED Final   Haemophilus influenzae NOT DETECTED NOT DETECTED Final   Neisseria meningitidis NOT DETECTED NOT DETECTED Final   Pseudomonas aeruginosa NOT DETECTED NOT DETECTED Final   Candida albicans NOT DETECTED NOT DETECTED Final   Candida glabrata NOT DETECTED NOT DETECTED Final   Candida krusei NOT DETECTED NOT DETECTED Final   Candida parapsilosis NOT DETECTED NOT DETECTED Final   Candida tropicalis NOT DETECTED NOT DETECTED Final    Comment: Performed at [facility], [address], [place], [place] [address]  Culture, blood (Routine X 2) w Reflex to ID Panel     Status: None   Collection Time: 11/14/16 12:33 AM  Result Value Ref Range Status   Specimen Description BLOOD RIGHT ANTECUBITAL  Final   Special Requests   Final    BOTTLES DRAWN AEROBIC ONLY Blood Culture adequate volume   Culture   Final    NO GROWTH 5 DAYS Performed at [facility], [address]  [place], [place] [address]    Report Status 11/19/2016 FINAL  Final  Culture, blood (Routine X 2) w Reflex to ID Panel     Status: Abnormal   Collection Time: 11/14/16 12:33 AM  Result Value Ref Range Status   Specimen Description BLOOD LEFT HAND  Final   Special Requests IN PEDIATRIC BOTTLE Blood Culture adequate volume  Final   Culture  Setup Time   Final    GRAM POSITIVE COCCI IN CLUSTERS IN PEDIATRIC BOTTLE CRITICAL VALUE NOTED.  VALUE IS CONSISTENT WITH PREVIOUSLY REPORTED AND CALLED VALUE.    Culture (A)  Final    STAPHYLOCOCCUS AUREUS SUSCEPTIBILITIES PERFORMED ON PREVIOUS CULTURE WITHIN THE LAST 5 DAYS. Performed at [facility], [address], [place], [place] [address]    Report Status 11/16/2016 FINAL  Final  Culture, blood (Routine X 2) w Reflex to ID Panel     Status: Abnormal   Collection Time: 11/14/16  4:10 PM  Result Value Ref Range Status   Specimen Description BLOOD RIGHT ARM  Final   Special Requests IN PEDIATRIC BOTTLE Blood Culture adequate volume  Final   Culture  Setup Time   Final  IN PEDIATRIC BOTTLE GRAM POSITIVE COCCI IN CLUSTERS CRITICAL RESULT CALLED TO, READ BACK BY AND VERIFIED WITH: PHARMD A PHAM [id number] 1036AM MLM    Culture (A)  Final    STAPHYLOCOCCUS AUREUS SUSCEPTIBILITIES PERFORMED ON PREVIOUS CULTURE WITHIN THE LAST 5 DAYS. Performed at [facility], [address] [place], [place] [address]    Report Status 11/17/2016 FINAL  Final  Culture, blood (Routine X 2) w Reflex to ID Panel     Status: None   Collection Time: 11/14/16  4:10 PM  Result Value Ref Range Status   Specimen Description BLOOD RIGHT ARM  Final   Special Requests   Final    BOTTLES DRAWN AEROBIC ONLY Blood Culture adequate volume   Culture   Final    NO GROWTH 5 DAYS Performed at [facility], [address], [place], [place] [address]    Report Status 11/19/2016 FINAL  Final  MRSA PCR Screening     Status: None   Collection Time: 11/16/16  5:49 PM    Result Value Ref Range Status   MRSA by PCR NEGATIVE NEGATIVE Final    Comment:        The GeneXpert MRSA Assay (FDA approved for NASAL specimens only), is one component of a comprehensive MRSA colonization surveillance program. It is not intended to diagnose MRSA infection nor to guide or monitor treatment for MRSA infections. DELTA CHECK NOTED   Body fluid culture (includes gram stain)     Status: None   Collection Time: 11/16/16  8:06 PM  Result Value Ref Range Status   Specimen Description PLEURAL FLUID  Final   Special Requests NONE  Final   Gram Stain   Final    FEW WBC PRESENT, PREDOMINANTLY PMN NO ORGANISMS SEEN    Culture   Final    FEW METHICILLIN RESISTANT STAPHYLOCOCCUS AUREUS RESULT CALLED TO, READ BACK BY AND VERIFIED WITH: R. MYRICK RN, AT 1023 11/18/16 BY D. VANHOOK REGARDING CULTURE GROWTH INTERPRET RESULTS WITH CAUTION DUE TO LIMITED SPECIMEN VOLUME Performed at [facility], [address] [place], [place] [address]    Report Status 11/20/2016 FINAL  Final   Organism ID, Bacteria METHICILLIN RESISTANT STAPHYLOCOCCUS AUREUS  Final      Susceptibility   Methicillin resistant staphylococcus aureus - MIC*    CIPROFLOXACIN >=8 RESISTANT Resistant     ERYTHROMYCIN >=8 RESISTANT Resistant     GENTAMICIN <=0.5 SENSITIVE Sensitive     OXACILLIN >=4 RESISTANT Resistant     TETRACYCLINE <=1 SENSITIVE Sensitive     VANCOMYCIN 1 SENSITIVE Sensitive     TRIMETH/SULFA <=10 SENSITIVE Sensitive     CLINDAMYCIN <=0.25 SENSITIVE Sensitive     RIFAMPIN <=0.5 SENSITIVE Sensitive     Inducible Clindamycin NEGATIVE Sensitive     * FEW METHICILLIN RESISTANT STAPHYLOCOCCUS AUREUS  Culture, blood (routine x 2)     Status: None (Preliminary result)   Collection Time: 11/19/16  8:57 AM  Result Value Ref Range Status   Specimen Description BLOOD RIGHT ARM  Final   Special Requests IN PEDIATRIC BOTTLE Blood Culture adequate volume  Final   Culture NO GROWTH 1 DAY  Final    Report Status PENDING  Incomplete  Culture, blood (routine x 2)     Status: None (Preliminary result)   Collection Time: 11/19/16  9:05 AM  Result Value Ref Range Status   Specimen Description BLOOD RIGHT ANTECUBITAL  Final   Special Requests IN PEDIATRIC BOTTLE Blood  Culture adequate volume  Final   Culture NO GROWTH 1 DAY  Final   Report Status PENDING  Incomplete    Assessment/Plan: S/P Procedure(s) (LRB): TRANSESOPHAGEAL ECHOCARDIOGRAM (TEE) (N/A)  1 stable 2 septic pulmonary emboli on CT scan- abx per ID recs 3 chest tube - no air leak, min drainage- check CXR- poss d/c chest tube today    LOS: 11 days    GOLD,WAYNE E 11/21/2016   I have seen and examined the patient and agree with the assessment and plan as outlined.  No CXR done this morning.  CT yesterday reveals tube in good posterior position.  Will leave tube in today and consider removal tomorrow if CXR stable.  I spent in excess of 15 minutes during the conduct of this hospital encounter and >50% of this time involved direct face-to-face encounter with the patient for counseling and/or coordination of their care.   Purcell Nails, MD 11/21/2016 9:18 AM

## 2016-11-21 NOTE — Progress Notes (Signed)
Name: Savannah Palmer MRN: [id number] DOB: 03-22-1991    ADMISSION DATE:  11/10/2016 CONSULTATION DATE:  11/11/2016  REFERRING MD :  Dr. Ethelda Chick   CHIEF COMPLAINT:  Endocarditis   BRIEF SUMMARY: 26 year old female with PMH of Anxiety/Depression, Polysubstance Abuse (Heronin, Cocaine)   Admitted to ED on 5/26 with Dyspnea and chest pain for one week. Reports Heronin use morning of arrival. Upon arrival to ED patient hypotensive, tachycardiac - non-responsive to fluid bolus, placed on neo gtt. Admitted to ICU under TRH. Blood cultures on 5/27 positive for MRSA, ID consulted. 2D Echo revealed right-sided vegetation.   Taken for TEE on 6/1 which revealed EF 55-60% with a 8x5 mm vegetation attached to posterior leaflet of the tricuspid valve, severe regurgitation. Patient arrived back to ICU, presented with shortness of breath and tachypnea. CXR revealed moderately large right-sided pneumothorax.     SUBJECTIVE:  Chest tube on h2o seal. NO air leak   VITAL SIGNS: Temp:  [98.2 F (36.8 C)-99.5 F (37.5 C)] 98.2 F (36.8 C) (06/06 1209) Pulse Rate:  [74-87] 74 (06/06 1209) Resp:  [16-20] 20 (06/06 1209) BP: (107-121)/(69-81) 108/81 (06/06 1209) SpO2:  [94 %-100 %] 100 % (06/06 1209) Weight:  [142 lb 3.2 oz (64.5 kg)] 142 lb 3.2 oz (64.5 kg) (06/06 0500)  PHYSICAL EXAMINATION: General:  WNWDWF awake and eating, co pain at rt ct site HEENT: MM pink/moist, no JVD/LAN PSY:Nl affect Neuro: Intact CV: HSR RRR PULM: even/non-labored, lungs bilaterally dimished YQ:MVHQ, non-tender, bsx4 active  Extremities: warm/dry, - edema  Skin: no rashes or lesions     Recent Labs Lab 11/19/16 1525 11/19/16 1828 11/20/16 0239 11/21/16 0256  NA 135  --  137 136  K 5.5* 3.9 4.0 3.7  CL 110  --  109 106  CO2 18*  --  22 22  BUN 14  --  10 9  CREATININE 1.08*  --  0.93 0.95  GLUCOSE 85  --  81 93    Recent Labs Lab 11/19/16 0414 11/20/16 0239 11/21/16 0256  HGB 7.7* 7.1* 8.2*   HCT 24.8* 22.6* 25.7*  WBC 8.7 9.4 8.8  PLT 335 398 411*   Dg Chest 2 View  Result Date: 11/21/2016 CLINICAL DATA:  Chest soreness.  Chest tube . EXAM: CHEST  2 VIEW COMPARISON:  CT 11/20/2016.  Chest x-ray 11/20/2016 FINDINGS: Chest tube noted on the right. Tiny right anterior hydropneumothorax again noted. No interim change . Stable cardiomegaly. Persistent bilateral pulmonary nodules including cavitary nodules. Reference is made to prior CT report. Small right pleural effusion. No pneumothorax. IMPRESSION: 1. Right chest tube in stable position. Tiny right anterior hydropneumothorax again noted. No interim change. 2. Multiple bilateral pulmonary nodules with cavitation. Reference is made to prior CT report of 11/20/2016 . Electronically Signed   By: Maisie Fus  Register   On: 11/21/2016 11:15   Dg Chest 2 View  Result Date: 11/19/2016 CLINICAL DATA:  Pneumothorax. EXAM: CHEST  2 VIEW COMPARISON:  11/19/2016. FINDINGS: Right chest tube in stable position. No significant pneumothorax noted on today's exam. Small right pleural effusion. Bilateral patchy pulmonary infiltrates with cavitation again noted. Small left pleural effusion noted. Heart size stable. No acute bony abnormality . IMPRESSION: 1. Right chest tube in stable position. No pneumothorax noted on today's exam. Small right pleural effusion. 2. Persistent bilateral cavitary pulmonary infiltrates. Small left pleural effusion. Electronically Signed   By: Maisie Fus  Register   On: 11/19/2016 16:53   Ct Chest Wo Contrast  Result Date: 11/20/2016 CLINICAL DATA:  Follow-up right pleural effusion EXAM: CT CHEST WITHOUT CONTRAST TECHNIQUE: Multidetector CT imaging of the chest was performed following the standard protocol without IV contrast. COMPARISON:  Radiograph 11/20/2016, CT chest 11/10/2016, prior radiographs dating back to 11/10/2016 FINDINGS: Cardiovascular: Limited assessment without intravenous contrast. Small fluid in the superior pericardial  recess. There is cardiomegaly. Mediastinum/Nodes: Difficult evaluation without contrast. Mediastinal adenopathy is present right paratracheal soft tissue fullness, possible adenopathy does not appear significantly changed. Midline trachea. Esophagus grossly unremarkable. Lungs/Pleura: Interim placement of a right lower lateral chest tube since the prior CT with the tip terminating along the right upper posterior pleural surface. Small bilateral pleural effusions right greater than left, slightly increased compared to the previous CT. Tiny right anterior hydropneumothorax. Multiple bilateral pulmonary nodules and cavitary lesions. Some of the cavitary lesions have decreased, however if there appears to be increased nodules/disease most notable in the left upper lobe and the right lower lobe. Decreased septal thickening compared to the previous exam. Upper Abdomen: Spleen appears enlarged. Partially visualized surgical clips in the gallbladder fossa. Musculoskeletal: No acute or suspicious bone lesion. IMPRESSION: 1. Interim placement of right-sided chest tube. Small residual pleural effusions right greater than left, mildly increased compared to the prior chest CT. Small right anterior hydropneumothorax. 2. Innumerable bilateral lung nodules and cavitary lesions, suspected to represent septic emboli. This appears increased in the left upper and right lower lobe since the prior CT. 3. Suspect mediastinal adenopathy although evaluation of the mediastinum is limited without contrast 4. Splenomegaly Electronically Signed   By: Jasmine PangKim  Fujinaga M.D.   On: 11/20/2016 21:25   Dg Chest Port 1 View  Result Date: 11/20/2016 CLINICAL DATA:  26 year old female with pneumothorax. Recent transesophageal echocardiogram. Subsequent encounter. EXAM: PORTABLE CHEST 1 VIEW COMPARISON:  11/20/2016 8:30 a.m. FINDINGS: Right-sided chest tube remains in place. No pneumothorax detected on this frontal projection. Cavitary lesions  bilaterally. Left base consolidation. Atelectasis/pleural thickening right lung base. Cardiomegaly. IMPRESSION: No significant change since exam earlier today. Electronically Signed   By: Lacy DuverneySteven  Olson M.D.   On: 11/20/2016 13:55   Dg Chest Port 1 View  Result Date: 11/20/2016 CLINICAL DATA:  Chest tube, shortness of Breath EXAM: PORTABLE CHEST 1 VIEW COMPARISON:  11/19/2016 FINDINGS: Right chest tube remains in place, unchanged. No pneumothorax. Patchy bilateral airspace opacities are again noted, some with cavitation. Small right pleural effusion. Heart is borderline in size. IMPRESSION: Right chest tube remains in place without pneumothorax. Stable patchy bilateral airspace disease, some of which is cavitary. Electronically Signed   By: Charlett NoseKevin  Dover M.D.   On: 11/20/2016 08:42   SIGNIFICANT EVENTS  5/26  Presents to ED 5/31  ABX changed to Teflaro 6/01  TEE, post procedure had pain in R shoulder / resp distress, R PTX with chest tube placement 6/02  Cubicin started 6/03  Residual PTX, large bore CT placed per CVTS (in addition to pigtail)' 6/05  CT to water seal   STUDIES:  CXR 5/26 >> New bilateral nodular airspace opacities throughout the upper and lower lobes bilaterally. Findings are concerning for multifocal infection such as septic emboli. CT Chest 5/26 >> Numerous cavitary consolidations throughout both lungs, with a peripheral distribution suggesting septic emboli. Largest cavitary consolidation is within the left lower lobe measuring approximately 4 cm greatest dimension CT A/P 5/30 >> Bibasilar, multifocal cavitary lesions noted within the visualized lower lobes consistent with septic emboli or possibly cavitary pneumonia. More confluent airspace disease in the left lower  lobe without cavitation is also present. There is a small right pleural effusion. Anasarca with moderate volume of ascites noted within the abdomen and pelvis. Question right heart dysfunction/failure. Splenomegaly.  Water attenuating cysts in the lower pole the right kidney in aggregate measuring 2 x 1.3 cm. Status post cholecystectomy. CXR 6/1 >> Moderately large right-sided pneumothorax new from the prior exam. Near complete collapse of the right lower lobe is noted  ANTIBIOTICS: Vanco 5/29 >> 6/1  Ceftaroline 5/31 >>  Daptomycin 6/2 >>  LINES: R CT (large bore) 6/3 >>   ASSESSMENT / PLAN:  Acute Hypoxic Respiratory Distress - secondary to R Pneumothorax post-procedure vs spontaneous  Septic Emboli to Lungs  Right Pneumothorax  MRSA Empyema - Staph aureus in pleural fluid  P:  CT to water seal with CVTS managing chest tube, may be dc'd today Follow CXR daily while CT in place Monitor drainage Appreciate CVTS assistance  Follow pleural fluid > staph, sensitivities pending  Mobilize TID PCCM will sign off 6/6   MRSA Bacteremia in setting of Endocarditis with Septic Emboli  -TEE with 8x5 mm vegetation attached to posterior leaflet of tricuspid valve, severe regurgitation -HIV and Hepatitis panel negative  P:  Day 12/x abx ID managing abx Follow repeat cultures Trend fever curve / WBC trend  Anemia P: PRBC per primary   6/6 PCCM will sign off, call if needed.  Brett Canales Minor ACNP [facility] P[facility]M Pager [phone] till 3 pm If no answer page [phone] 11/21/2016, 12:11 PM  Attending Note:  I have reviewed all labs, studies and notes. I have discussed the case with S Minor, and I agree with the data and plans as amended above. 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 Agree with current abx. TCTS managing chest tube, appreciate their assistance. We will be available if needed.    Levy Pupa, MD, PhD 11/21/2016, 4:26 PM [facility] [phone] or if no answer [phone]

## 2016-11-21 NOTE — Progress Notes (Signed)
[facility] PROGRESS NOTE  Savannah Palmer YNW:[id number] DOB: 1991-01-11 DOA: 11/10/2016  PCP: Patient, No Pcp Per  Reason for Visit: Tricuspid valve endocarditis with septic emboli. Spontaneous pneumothorax.  Subjective/Interval History: Seen with mother at bedside, is some pain around the chest tube placement no other complaints.  Brief History/Interval Summary: 26 year old Caucasian female with a past medical history of anxiety, depression, heroin abuse, presented with weakness, cough and chest pain. Initially was in septic shock and required pressors. Blood cultures grew MRSA. 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 She was placed on IV antibiotics. She also developed a spontaneous right-sided pneumothorax. Chest tube was placed by critical care medicine. She was transferred to [facility] for opinion from cardiothoracic surgery. Patient is not a candidate for surgery at this time.  Assessment/Plan: Principal Problem:   Bacterial endocarditis Active Problems:   IVDU (intravenous drug user)   Polysubstance (including opioids) dependence, daily use (HCC)   Septic pulmonary embolism (HCC)   Septic shock (HCC)   Encounter for central line placement   Respiratory failure (HCC)   MRSA bacteremia   Hypokalemia   Hypophosphatemia   Endocarditis   Thrombocytopenia (HCC)   RUQ pain   Right hip pain   Pneumothorax on right   Tricuspid valve regurgitation, infectious   Pleural effusion on right   MRSA bacteremia/Septic shock with right TV endocarditis and septic emboli to lungs/MRSA empyema - Initial blood cultures from 5/27 were MRSA positive  -Repeat cultures were still positive, cultures repeated on 6/4, NGTD. -Patient was transitioned to Teflaro on 5/31 due to persistent fevers.  -5/27- 2-D echo - showed a tricuspid vegetation with Moderate TR- -6/1- TEE showed a large tricuspid  vegetation, severe TR with dilated RV along with a loculated left effusion -HIV, Hep C negative -Due to family request, RPR was done it's been negative. -Due to persistent bacteremia patient was started on daptomycin 6/2. Ceftaroline also being continued to cover lung infection. -Seen by cardiothoracic surgery. Not a candidate for surgery at this time. -Eventually needs PICC line after negative cultures and antibiotics for total 6 weeks bending ID recommendations.  Right spontaneous pneumothorax- acute respiratory failure/MRSA Empyema -Right empyema with pneumothorax, positive for MRSA. Status post chest tube. -Per CT surgery note, CXR and possible removal of the chest tube today.  Acute kidney injury -Baseline creatinine 0.7, creatinine increased to 1.37, likely secondary to dehydration and NSAIDs. -NSAIDs discontinued, given IV fluids this is back to normal, this resolved.  RUQ pain, likely musculoskeletal - obtained CT scan- no noted abnormalities in liver or gallbladder- has ascites - pain appears to be musculoskeletal- K pad. Stable.  Ascites, anasarca due to right sided hear failure - as a result of severe TR - limit IVF and follow - hold off on administering diuretics in setting of sepsis and borderline BPs - follow daily weights and I and O. Weight appears to be stable.  Splenomegaly - etiology undetermined- CT on 5/30 revealed that the spleen is enlarged measuring 15.7 x 5.6 x 17 cm (volume= 780 cm^3) - liver is unremarkable on CT  Normocytic Anemia - Hemoglobin has trended down more in the last 48 hours. Patient does have some dyspnea with exertion, which could be suggestive of symptomatic anemia. She is agreeable to blood transfusion. She'll be transfused 1 unit with Lasix afterwards. Recheck hemoglobin tomorrow. No overt bleeding has been noted. - Iron was 12. Ferritin 213. B-12  1850. Folate 7.8.  Hypokalemia/Hypophosphatemia/Hypomagnesemia - repleted- follow     Thrombocytopenia   Has resolved  Heroin abuse - cont Suboxone- will need transition to a drug rehab program once stable.   Anxiety - cont Buspar, Neurontin, Xanax  Nicotine abuse - Nicoderm patch  DVT Prophylaxis: Subcutaneous heparin    Code Status: Full code  Family Communication: Discussed with patient and her mother Disposition Plan: Management as outlined above.   Consultants: Pulmonary and critical care medicine. Cardiothoracic surgery. Infectious disease.  Procedures:  TEE Study Conclusions  - Left ventricle: The cavity size was normal. Wall thickness was   normal. Systolic function was normal. The estimated ejection   fraction was in the range of 55% to 60%. Wall motion was normal;   there were no regional wall motion abnormalities. - Left atrium: No evidence of thrombus in the atrial cavity or   appendage. - Right ventricle: The cavity size was mildly dilated. Wall   thickness was normal. - Right atrium: No evidence of thrombus in the atrial cavity or   appendage. - Tricuspid valve: Flail motion of the posterior leaflet. There is   a 8x5 mm vegetation attached to the posterior leaflet, but most   of the &quot;mass&quot; described on transthoracic imaging appears to be   the flail segment of the valve. There was severe regurgitation   directed eccentrically and toward the free wall.  Right chest tube placement for pneumothorax 6/1  Placement of a larger bore chest tube in the right chest 6/3  Antibiotics: Anti-infectives    Start     Dose/Rate Route Frequency Ordered Stop   11/17/16 1600  DAPTOmycin (CUBICIN) 500 mg in sodium chloride 0.9 % IVPB     500 mg 220 mL/hr over 30 Minutes Intravenous Every 24 hours 11/17/16 1432     11/17/16 1500  ceftaroline (TEFLARO) 600 mg in sodium chloride 0.9 % 250 mL IVPB     600 mg 250 mL/hr over 60 Minutes Intravenous Every 12 hours 11/17/16 1435     11/15/16 1800  ceftaroline (TEFLARO) 600 mg in sodium chloride  0.9 % 250 mL IVPB  Status:  Discontinued     600 mg 250 mL/hr over 60 Minutes Intravenous Every 12 hours 11/15/16 1635 11/17/16 1431   11/13/16 1215  vancomycin (VANCOCIN) IVPB 1000 mg/200 mL premix  Status:  Discontinued     1,000 mg 200 mL/hr over 60 Minutes Intravenous Every 8 hours 11/13/16 1203 11/16/16 0850   11/11/16 1100  vancomycin (VANCOCIN) IVPB 750 mg/150 ml premix  Status:  Discontinued     750 mg 150 mL/hr over 60 Minutes Intravenous Every 8 hours 11/11/16 0956 11/13/16 1203   11/11/16 0400  vancomycin (VANCOCIN) 500 mg in sodium chloride 0.9 % 100 mL IVPB  Status:  Discontinued     500 mg 100 mL/hr over 60 Minutes Intravenous Every 12 hours 11/10/16 1951 11/11/16 0956   11/10/16 2200  ceFEPIme (MAXIPIME) 1 g in dextrose 5 % 50 mL IVPB  Status:  Discontinued     1 g 100 mL/hr over 30 Minutes Intravenous Every 12 hours 11/10/16 1951 11/11/16 0926   11/10/16 2000  ceFEPIme (MAXIPIME) 2 g in dextrose 5 % 50 mL IVPB  Status:  Discontinued     2 g 100 mL/hr over 30 Minutes Intravenous  Once 11/10/16 1952 11/10/16 1954   11/10/16 2000  vancomycin (VANCOCIN) IVPB 1000 mg/200 mL premix  Status:  Discontinued     1,000 mg 200 mL/hr over  60 Minutes Intravenous  Once 11/10/16 1952 11/10/16 1954   11/10/16 1430  piperacillin-tazobactam (ZOSYN) IVPB 3.375 g     3.375 g 100 mL/hr over 30 Minutes Intravenous  Once 11/10/16 1426 11/10/16 1503   11/10/16 1430  vancomycin (VANCOCIN) IVPB 1000 mg/200 mL premix     1,000 mg 200 mL/hr over 60 Minutes Intravenous  Once 11/10/16 1426 11/10/16 1653        ROS: Denies any nausea, vomiting  Objective:  Vital Signs  Vitals:   11/20/16 2312 11/21/16 0306 11/21/16 0500 11/21/16 0806  BP: 110/71 107/69  111/70  Pulse: 85 84  78  Resp: 16 19  17   Temp: 98.9 F (37.2 C) 98.9 F (37.2 C)  99.5 F (37.5 C)  TempSrc: Oral Oral  Oral  SpO2: 94% 95%  95%  Weight:   64.5 kg (142 lb 3.2 oz)   Height:        Intake/Output Summary (Last 24  hours) at 11/21/16 1037 Last data filed at 11/21/16 0900  Gross per 24 hour  Intake             1735 ml  Output              541 ml  Net             1194 ml   Filed Weights   11/19/16 0500 11/20/16 0310 11/21/16 0500  Weight: 65.7 kg (144 lb 13.5 oz) 66.3 kg (146 lb 2.6 oz) 64.5 kg (142 lb 3.2 oz)    General appearance: Awake, alert. No distress Resp: Diminished entry at the bases. Few crackles. No wheezing. Chest tube is noted in the right Cardio: S1, S2 is normal, regular. No S3, S4. Systolic murmur appreciated over the tricuspid area. Minimal pedal edema GI: Abdomen soft. Nontender, nondistended. Bowel sounds present. No masses or megaly Extremities: Mild pedal edema Neurologic: Oriented 3. No focal neurological deficits  Lab Results:  Data Reviewed: I have personally reviewed following labs and imaging studies  CBC:  Recent Labs Lab 11/18/16 0214 11/18/16 1356 11/19/16 0414 11/20/16 0239 11/21/16 0256  WBC 8.8 10.7* 8.7 9.4 8.8  HGB 7.7* 7.9* 7.7* 7.1* 8.2*  HCT 24.3* 25.0* 24.8* 22.6* 25.7*  MCV 85.9 85.9 86.7 85.9 85.4  PLT 243 309 335 398 411*    Basic Metabolic Panel:  Recent Labs Lab 11/18/16 0214 11/19/16 0414 11/19/16 1525 11/19/16 1828 11/20/16 0239 11/21/16 0256  NA 138 139 135  --  137 136  K 4.1 3.9 5.5* 3.9 4.0 3.7  CL 109 109 110  --  109 106  CO2 21* 23 18*  --  22 22  GLUCOSE 97 97 85  --  81 93  BUN 14 15 14   --  10 9  CREATININE 0.99 1.37* 1.08*  --  0.93 0.95  CALCIUM 7.5* 7.9* 7.7*  --  7.9* 7.9*  MG 1.7 2.2  --   --  1.7 1.7  PHOS 4.9* 6.0*  --   --  4.9* 4.8*    GFR: Estimated Creatinine Clearance: 81 mL/min (by C-G formula based on SCr of 0.95 mg/dL).  Liver Function Tests:  Recent Labs Lab 11/16/16 2040 11/21/16 0256  AST  --  13*  ALT  --  8*  ALKPHOS  --  151*  BILITOT  --  0.4  PROT 5.8* 5.6*  ALBUMIN  --  1.5*    Cardiac Enzymes:  Recent Labs Lab 11/18/16 0214  CKTOTAL 30*  CBG:  Recent Labs Lab  11/14/16 1639 11/14/16 1947 11/15/16 0002 11/15/16 0748 11/19/16 1223  GLUCAP 84 92 109* 85 83    Anemia Panel: No results for input(s): VITAMINB12, FOLATE, FERRITIN, TIBC, IRON, RETICCTPCT in the last 72 hours.  Recent Results (from the past 240 hour(s))  Culture, blood (Routine X 2) w Reflex to ID Panel     Status: Abnormal   Collection Time: 11/12/16  5:42 PM  Result Value Ref Range Status   Specimen Description BLOOD RIGHT ARM  Final   Special Requests IN PEDIATRIC BOTTLE Blood Culture adequate volume  Final   Culture  Setup Time   Final    GRAM POSITIVE COCCI IN CLUSTERS IN PEDIATRIC BOTTLE CRITICAL RESULT CALLED TO, READ BACK BY AND VERIFIED WITH: N. GLOGOVAC PHARMD, AT 1610 11/13/16 BY D. VANHOOK    Culture (A)  Final    STAPHYLOCOCCUS AUREUS SUSCEPTIBILITIES PERFORMED ON PREVIOUS CULTURE WITHIN THE LAST 5 DAYS. Performed at [facility], [address], [place], [place] [address]    Report Status 11/15/2016 FINAL  Final  Culture, blood (Routine X 2) w Reflex to ID Panel     Status: Abnormal   Collection Time: 11/12/16  5:43 PM  Result Value Ref Range Status   Specimen Description BLOOD RIGHT ARM  Final   Special Requests   Final    BOTTLES DRAWN AEROBIC AND ANAEROBIC Blood Culture adequate volume   Culture  Setup Time   Final    GRAM POSITIVE COCCI IN CLUSTERS ANAEROBIC BOTTLE ONLY CRITICAL VALUE NOTED.  VALUE IS CONSISTENT WITH PREVIOUSLY REPORTED AND CALLED VALUE.    Culture (A)  Final    STAPHYLOCOCCUS AUREUS SUSCEPTIBILITIES PERFORMED ON PREVIOUS CULTURE WITHIN THE LAST 5 DAYS. Performed at [facility], [address], [place], [place] [address]    Report Status 11/15/2016 FINAL  Final  Culture, blood (Routine X 2) w Reflex to ID Panel     Status: None   Collection Time: 11/13/16 10:49 AM  Result Value Ref Range Status   Specimen Description BLOOD RIGHT ANTECUBITAL  Final   Special Requests   Final    BOTTLES DRAWN AEROBIC ONLY Blood Culture  adequate volume   Culture   Final    NO GROWTH 5 DAYS Performed at [facility], [address], [place], [place] [address]    Report Status 11/18/2016 FINAL  Final  Culture, blood (Routine X 2) w Reflex to ID Panel     Status: Abnormal   Collection Time: 11/13/16 10:52 AM  Result Value Ref Range Status   Specimen Description BLOOD LEFT ARM  Final   Special Requests IN PEDIATRIC BOTTLE Blood Culture adequate volume  Final   Culture  Setup Time   Final    IN PEDIATRIC BOTTLE GRAM POSITIVE COCCI IN CLUSTERS Organism ID to follow CRITICAL RESULT CALLED TO, READ BACK BY AND VERIFIED WITH: TO BGREEN(PHARMd) BY TCLEVELAND 11/14/2016 AT 5:05AM    Culture (A)  Final    STAPHYLOCOCCUS AUREUS SUSCEPTIBILITIES PERFORMED ON PREVIOUS CULTURE WITHIN THE LAST 5 DAYS. Performed at [facility], [address] [place], [place] [address]    Report Status 11/16/2016 FINAL  Final  Blood Culture ID Panel (Reflexed)     Status: Abnormal   Collection Time: 11/13/16 10:52 AM  Result Value Ref Range Status   Enterococcus species NOT DETECTED NOT DETECTED Final   Listeria monocytogenes NOT DETECTED NOT DETECTED Final   Staphylococcus species  DETECTED (A) NOT DETECTED Final    Comment: CRITICAL RESULT CALLED TO, READ BACK BY AND VERIFIED WITH: TO BGREEN(PHARMd) BY TCLEVELAND 11/14/16 AT 5:05AM    Staphylococcus aureus DETECTED (A) NOT DETECTED Final    Comment: Methicillin (oxacillin)-resistant Staphylococcus aureus (MRSA). MRSA is predictably resistant to beta-lactam antibiotics (except ceftaroline). Preferred therapy is vancomycin unless clinically contraindicated. Patient requires contact precautions if  hospitalized. CRITICAL RESULT CALLED TO, READ BACK BY AND VERIFIED WITH: TO BGREEN(PHARMd) BY TCLEVELAND 11/14/16 AT 5:05AM    Methicillin resistance DETECTED (A) NOT DETECTED Final    Comment: CRITICAL RESULT CALLED TO, READ BACK BY AND VERIFIED WITH: TO BGREEN(PHARMd) BY TCLEVELAND 11/14/16  AT 5:05AM    Streptococcus species NOT DETECTED NOT DETECTED Final   Streptococcus agalactiae NOT DETECTED NOT DETECTED Final   Streptococcus pneumoniae NOT DETECTED NOT DETECTED Final   Streptococcus pyogenes NOT DETECTED NOT DETECTED Final   Acinetobacter baumannii NOT DETECTED NOT DETECTED Final   Enterobacteriaceae species NOT DETECTED NOT DETECTED Final   Enterobacter cloacae complex NOT DETECTED NOT DETECTED Final   Escherichia coli NOT DETECTED NOT DETECTED Final   Klebsiella oxytoca NOT DETECTED NOT DETECTED Final   Klebsiella pneumoniae NOT DETECTED NOT DETECTED Final   Proteus species NOT DETECTED NOT DETECTED Final   Serratia marcescens NOT DETECTED NOT DETECTED Final   Haemophilus influenzae NOT DETECTED NOT DETECTED Final   Neisseria meningitidis NOT DETECTED NOT DETECTED Final   Pseudomonas aeruginosa NOT DETECTED NOT DETECTED Final   Candida albicans NOT DETECTED NOT DETECTED Final   Candida glabrata NOT DETECTED NOT DETECTED Final   Candida krusei NOT DETECTED NOT DETECTED Final   Candida parapsilosis NOT DETECTED NOT DETECTED Final   Candida tropicalis NOT DETECTED NOT DETECTED Final    Comment: Performed at [facility], [address], [place], [place] [address]  Culture, blood (Routine X 2) w Reflex to ID Panel     Status: None   Collection Time: 11/14/16 12:33 AM  Result Value Ref Range Status   Specimen Description BLOOD RIGHT ANTECUBITAL  Final   Special Requests   Final    BOTTLES DRAWN AEROBIC ONLY Blood Culture adequate volume   Culture   Final    NO GROWTH 5 DAYS Performed at [facility], [address] [place], [place] [address]    Report Status 11/19/2016 FINAL  Final  Culture, blood (Routine X 2) w Reflex to ID Panel     Status: Abnormal   Collection Time: 11/14/16 12:33 AM  Result Value Ref Range Status   Specimen Description BLOOD LEFT HAND  Final   Special Requests IN PEDIATRIC BOTTLE Blood Culture adequate volume  Final   Culture   Setup Time   Final    GRAM POSITIVE COCCI IN CLUSTERS IN PEDIATRIC BOTTLE CRITICAL VALUE NOTED.  VALUE IS CONSISTENT WITH PREVIOUSLY REPORTED AND CALLED VALUE.    Culture (A)  Final    STAPHYLOCOCCUS AUREUS SUSCEPTIBILITIES PERFORMED ON PREVIOUS CULTURE WITHIN THE LAST 5 DAYS. Performed at [facility], [address] [place], [place] [address]    Report Status 11/16/2016 FINAL  Final  Culture, blood (Routine X 2) w Reflex to ID Panel     Status: Abnormal   Collection Time: 11/14/16  4:10 PM  Result Value Ref Range Status   Specimen Description BLOOD RIGHT ARM  Final   Special Requests IN PEDIATRIC BOTTLE Blood Culture adequate volume  Final   Culture  Setup Time   Final  IN PEDIATRIC BOTTLE GRAM POSITIVE COCCI IN CLUSTERS CRITICAL RESULT CALLED TO, READ BACK BY AND VERIFIED WITH: PHARMD A PHAM [id number] 1036AM MLM    Culture (A)  Final    STAPHYLOCOCCUS AUREUS SUSCEPTIBILITIES PERFORMED ON PREVIOUS CULTURE WITHIN THE LAST 5 DAYS. Performed at [facility], [address], [place], [place] [address]    Report Status 11/17/2016 FINAL  Final  Culture, blood (Routine X 2) w Reflex to ID Panel     Status: None   Collection Time: 11/14/16  4:10 PM  Result Value Ref Range Status   Specimen Description BLOOD RIGHT ARM  Final   Special Requests   Final    BOTTLES DRAWN AEROBIC ONLY Blood Culture adequate volume   Culture   Final    NO GROWTH 5 DAYS Performed at [facility], [address], [place], [place] [address]    Report Status 11/19/2016 FINAL  Final  MRSA PCR Screening     Status: None   Collection Time: 11/16/16  5:49 PM  Result Value Ref Range Status   MRSA by PCR NEGATIVE NEGATIVE Final    Comment:        The GeneXpert MRSA Assay (FDA approved for NASAL specimens only), is one component of a comprehensive MRSA colonization surveillance program. It is not intended to diagnose MRSA infection nor to guide or monitor treatment for MRSA  infections. DELTA CHECK NOTED   Body fluid culture (includes gram stain)     Status: None   Collection Time: 11/16/16  8:06 PM  Result Value Ref Range Status   Specimen Description PLEURAL FLUID  Final   Special Requests NONE  Final   Gram Stain   Final    FEW WBC PRESENT, PREDOMINANTLY PMN NO ORGANISMS SEEN    Culture   Final    FEW METHICILLIN RESISTANT STAPHYLOCOCCUS AUREUS RESULT CALLED TO, READ BACK BY AND VERIFIED WITH: R. MYRICK RN, AT 1023 11/18/16 BY D. VANHOOK REGARDING CULTURE GROWTH INTERPRET RESULTS WITH CAUTION DUE TO LIMITED SPECIMEN VOLUME Performed at [facility], [address] [place], [place] [address]    Report Status 11/20/2016 FINAL  Final   Organism ID, Bacteria METHICILLIN RESISTANT STAPHYLOCOCCUS AUREUS  Final      Susceptibility   Methicillin resistant staphylococcus aureus - MIC*    CIPROFLOXACIN >=8 RESISTANT Resistant     ERYTHROMYCIN >=8 RESISTANT Resistant     GENTAMICIN <=0.5 SENSITIVE Sensitive     OXACILLIN >=4 RESISTANT Resistant     TETRACYCLINE <=1 SENSITIVE Sensitive     VANCOMYCIN 1 SENSITIVE Sensitive     TRIMETH/SULFA <=10 SENSITIVE Sensitive     CLINDAMYCIN <=0.25 SENSITIVE Sensitive     RIFAMPIN <=0.5 SENSITIVE Sensitive     Inducible Clindamycin NEGATIVE Sensitive     * FEW METHICILLIN RESISTANT STAPHYLOCOCCUS AUREUS  Culture, blood (routine x 2)     Status: None (Preliminary result)   Collection Time: 11/19/16  8:57 AM  Result Value Ref Range Status   Specimen Description BLOOD RIGHT ARM  Final   Special Requests IN PEDIATRIC BOTTLE Blood Culture adequate volume  Final   Culture NO GROWTH 1 DAY  Final   Report Status PENDING  Incomplete  Culture, blood (routine x 2)     Status: None (Preliminary result)   Collection Time: 11/19/16  9:05 AM  Result Value Ref Range Status   Specimen Description BLOOD RIGHT ANTECUBITAL  Final   Special Requests IN PEDIATRIC BOTTLE Blood Culture adequate  volume  Final   Culture NO GROWTH 1  DAY  Final   Report Status PENDING  Incomplete      Radiology Studies: Dg Chest 2 View  Result Date: 11/19/2016 CLINICAL DATA:  Pneumothorax. EXAM: CHEST  2 VIEW COMPARISON:  11/19/2016. FINDINGS: Right chest tube in stable position. No significant pneumothorax noted on today's exam. Small right pleural effusion. Bilateral patchy pulmonary infiltrates with cavitation again noted. Small left pleural effusion noted. Heart size stable. No acute bony abnormality . IMPRESSION: 1. Right chest tube in stable position. No pneumothorax noted on today's exam. Small right pleural effusion. 2. Persistent bilateral cavitary pulmonary infiltrates. Small left pleural effusion. Electronically Signed   By: Maisie Fus  Register   On: 11/19/2016 16:53   Ct Chest Wo Contrast  Result Date: 11/20/2016 CLINICAL DATA:  Follow-up right pleural effusion EXAM: CT CHEST WITHOUT CONTRAST TECHNIQUE: Multidetector CT imaging of the chest was performed following the standard protocol without IV contrast. COMPARISON:  Radiograph 11/20/2016, CT chest 11/10/2016, prior radiographs dating back to 11/10/2016 FINDINGS: Cardiovascular: Limited assessment without intravenous contrast. Small fluid in the superior pericardial recess. There is cardiomegaly. Mediastinum/Nodes: Difficult evaluation without contrast. Mediastinal adenopathy is present right paratracheal soft tissue fullness, possible adenopathy does not appear significantly changed. Midline trachea. Esophagus grossly unremarkable. Lungs/Pleura: Interim placement of a right lower lateral chest tube since the prior CT with the tip terminating along the right upper posterior pleural surface. Small bilateral pleural effusions right greater than left, slightly increased compared to the previous CT. Tiny right anterior hydropneumothorax. Multiple bilateral pulmonary nodules and cavitary lesions. Some of the cavitary lesions have decreased, however if there appears to be increased nodules/disease  most notable in the left upper lobe and the right lower lobe. Decreased septal thickening compared to the previous exam. Upper Abdomen: Spleen appears enlarged. Partially visualized surgical clips in the gallbladder fossa. Musculoskeletal: No acute or suspicious bone lesion. IMPRESSION: 1. Interim placement of right-sided chest tube. Small residual pleural effusions right greater than left, mildly increased compared to the prior chest CT. Small right anterior hydropneumothorax. 2. Innumerable bilateral lung nodules and cavitary lesions, suspected to represent septic emboli. This appears increased in the left upper and right lower lobe since the prior CT. 3. Suspect mediastinal adenopathy although evaluation of the mediastinum is limited without contrast 4. Splenomegaly Electronically Signed   By: Jasmine Pang M.D.   On: 11/20/2016 21:25   Dg Chest Port 1 View  Result Date: 11/20/2016 CLINICAL DATA:  26 year old female with pneumothorax. Recent transesophageal echocardiogram. Subsequent encounter. EXAM: PORTABLE CHEST 1 VIEW COMPARISON:  11/20/2016 8:30 a.m. FINDINGS: Right-sided chest tube remains in place. No pneumothorax detected on this frontal projection. Cavitary lesions bilaterally. Left base consolidation. Atelectasis/pleural thickening right lung base. Cardiomegaly. IMPRESSION: No significant change since exam earlier today. Electronically Signed   By: Lacy Duverney M.D.   On: 11/20/2016 13:55   Dg Chest Port 1 View  Result Date: 11/20/2016 CLINICAL DATA:  Chest tube, shortness of Breath EXAM: PORTABLE CHEST 1 VIEW COMPARISON:  11/19/2016 FINDINGS: Right chest tube remains in place, unchanged. No pneumothorax. Patchy bilateral airspace opacities are again noted, some with cavitation. Small right pleural effusion. Heart is borderline in size. IMPRESSION: Right chest tube remains in place without pneumothorax. Stable patchy bilateral airspace disease, some of which is cavitary. Electronically Signed    By: Charlett Nose M.D.   On: 11/20/2016 08:42     Medications:  Scheduled: . acetaminophen  1,000 mg Oral Q6H  .  buprenorphine-naloxone  2 tablet Sublingual Daily  . busPIRone  10 mg Oral BID  . citalopram  10 mg Oral Daily  . gabapentin  300 mg Oral TID  . heparin  5,000 Units Subcutaneous Q8H  . nicotine  14 mg Transdermal Daily  . saccharomyces boulardii  250 mg Oral BID   Continuous: . sodium chloride 250 mL (11/19/16 0700)  . ceFTAROline (TEFLARO) IV Stopped (11/21/16 0306)  . DAPTOmycin (CUBICIN)  IV Stopped (11/20/16 1729)   RUE:[id number]:sodium chloride, albuterol, ALPRAZolam, docusate, nicotine polacrilex, ondansetron (ZOFRAN) IV, oxyCODONE    LOS: 11 days   [facility] PalmELMAHI,Raquel Sayres A  [facility] Pager [phone] 11/21/2016, 10:37 AM  If 7PM-7AM, please contact night-coverage at [email or web address], password [email or web address]

## 2016-11-21 NOTE — Progress Notes (Signed)
[facility]   Reason for visit: Follow up on endocarditis   Interval History: repeat blood cultures so far ngtd from 6/4; no fever, no new complaints except some discomfort at the chest tube site Total antibiotic days 12 daptomycin day 5 ceftaroline day 7   CT chest independently reviewed and new areas of emboli noted  Physical Exam: Constitutional:  Vitals:   11/21/16 0306 11/21/16 0806  BP: 107/69 111/70  Pulse: 84 78  Resp: 19 17  Temp: 98.9 F (37.2 C) 99.5 F (37.5 C)   patient appears in NAD Respiratory: Normal respiratory effort; CTA B Cardiovascular: RRR GI: soft, nt, nd  Review of Systems: Constitutional: negative for fevers and chills Gastrointestinal: negative for diarrhea  Lab Results  Component Value Date   WBC 8.8 11/21/2016   HGB 8.2 (L) 11/21/2016   HCT 25.7 (L) 11/21/2016   MCV 85.4 11/21/2016   PLT 411 (H) 11/21/2016    Lab Results  Component Value Date   CREATININE 0.95 11/21/2016   BUN 9 11/21/2016   NA 136 11/21/2016   K 3.7 11/21/2016   CL 106 11/21/2016   CO2 22 11/21/2016    Lab Results  Component Value Date   ALT 8 (L) 11/21/2016   AST 13 (L) 11/21/2016   ALKPHOS 151 (H) 11/21/2016     Microbiology: Recent Results (from the past 240 hour(s))  Culture, blood (Routine X 2) w Reflex to ID Panel     Status: Abnormal   Collection Time: 11/11/16 10:30 AM  Result Value Ref Range Status   Specimen Description BLOOD RIGHT HAND  Final   Special Requests   Final    BOTTLES DRAWN AEROBIC AND ANAEROBIC Blood Culture adequate volume IMMUNOCOMPROMISED   Culture  Setup Time   Final    GRAM POSITIVE COCCI IN CLUSTERS IN BOTH AEROBIC AND ANAEROBIC BOTTLES CRITICAL VALUE NOTED.  VALUE IS CONSISTENT WITH PREVIOUSLY REPORTED AND CALLED VALUE.    Culture (A)  Final    STAPHYLOCOCCUS AUREUS SUSCEPTIBILITIES PERFORMED ON PREVIOUS CULTURE WITHIN THE LAST 5 DAYS. Performed at [facility], [address],  [place], [place] [address]    Report Status 11/16/2016 FINAL  Final  Culture, blood (Routine X 2) w Reflex to ID Panel     Status: Abnormal   Collection Time: 11/11/16 10:34 AM  Result Value Ref Range Status   Specimen Description BLOOD BLOOD RIGHT HAND  Final   Special Requests   Final    BOTTLES DRAWN AEROBIC AND ANAEROBIC Blood Culture adequate volume   Culture  Setup Time   Final    GRAM POSITIVE COCCI IN CLUSTERS IN BOTH AEROBIC AND ANAEROBIC BOTTLES CRITICAL RESULT CALLED TO, READ BACK BY AND VERIFIED WITH: T. PICKERING PHARMD, AT 0702 11/12/16 BY D. VANHOOK    Culture (A)  Final    STAPHYLOCOCCUS AUREUS SUSCEPTIBILITIES PERFORMED ON PREVIOUS CULTURE WITHIN THE LAST 5 DAYS. Performed at [facility], [address], [place], [place] [address]    Report Status 11/14/2016 FINAL  Final  Culture, blood (Routine X 2) w Reflex to ID Panel     Status: Abnormal   Collection Time: 11/12/16  5:42 PM  Result Value Ref Range Status   Specimen Description BLOOD RIGHT ARM  Final   Special Requests IN PEDIATRIC BOTTLE Blood Culture adequate volume  Final   Culture  Setup Time   Final    GRAM POSITIVE COCCI IN CLUSTERS IN PEDIATRIC BOTTLE  CRITICAL RESULT CALLED TO, READ BACK BY AND VERIFIED WITH: N. GLOGOVAC PHARMD, AT 1610 11/13/16 BY D. VANHOOK    Culture (A)  Final    STAPHYLOCOCCUS AUREUS SUSCEPTIBILITIES PERFORMED ON PREVIOUS CULTURE WITHIN THE LAST 5 DAYS. Performed at [facility], [address] [place], [place] [address]    Report Status 11/15/2016 FINAL  Final  Culture, blood (Routine X 2) w Reflex to ID Panel     Status: Abnormal   Collection Time: 11/12/16  5:43 PM  Result Value Ref Range Status   Specimen Description BLOOD RIGHT ARM  Final   Special Requests   Final    BOTTLES DRAWN AEROBIC AND ANAEROBIC Blood Culture adequate volume   Culture  Setup Time   Final    GRAM POSITIVE COCCI IN CLUSTERS ANAEROBIC BOTTLE ONLY CRITICAL VALUE NOTED.  VALUE IS CONSISTENT  WITH PREVIOUSLY REPORTED AND CALLED VALUE.    Culture (A)  Final    STAPHYLOCOCCUS AUREUS SUSCEPTIBILITIES PERFORMED ON PREVIOUS CULTURE WITHIN THE LAST 5 DAYS. Performed at [facility], [address] [place], [place] [address]    Report Status 11/15/2016 FINAL  Final  Culture, blood (Routine X 2) w Reflex to ID Panel     Status: None   Collection Time: 11/13/16 10:49 AM  Result Value Ref Range Status   Specimen Description BLOOD RIGHT ANTECUBITAL  Final   Special Requests   Final    BOTTLES DRAWN AEROBIC ONLY Blood Culture adequate volume   Culture   Final    NO GROWTH 5 DAYS Performed at [facility], [address] [place], [place] [address]    Report Status 11/18/2016 FINAL  Final  Culture, blood (Routine X 2) w Reflex to ID Panel     Status: Abnormal   Collection Time: 11/13/16 10:52 AM  Result Value Ref Range Status   Specimen Description BLOOD LEFT ARM  Final   Special Requests IN PEDIATRIC BOTTLE Blood Culture adequate volume  Final   Culture  Setup Time   Final    IN PEDIATRIC BOTTLE GRAM POSITIVE COCCI IN CLUSTERS Organism ID to follow CRITICAL RESULT CALLED TO, READ BACK BY AND VERIFIED WITH: TO BGREEN(PHARMd) BY TCLEVELAND 11/14/2016 AT 5:05AM    Culture (A)  Final    STAPHYLOCOCCUS AUREUS SUSCEPTIBILITIES PERFORMED ON PREVIOUS CULTURE WITHIN THE LAST 5 DAYS. Performed at [facility], [address], [place], [place] [address]    Report Status 11/16/2016 FINAL  Final  Blood Culture ID Panel (Reflexed)     Status: Abnormal   Collection Time: 11/13/16 10:52 AM  Result Value Ref Range Status   Enterococcus species NOT DETECTED NOT DETECTED Final   Listeria monocytogenes NOT DETECTED NOT DETECTED Final   Staphylococcus species DETECTED (A) NOT DETECTED Final    Comment: CRITICAL RESULT CALLED TO, READ BACK BY AND VERIFIED WITH: TO BGREEN(PHARMd) BY TCLEVELAND 11/14/16 AT 5:05AM    Staphylococcus aureus DETECTED (A) NOT DETECTED Final     Comment: Methicillin (oxacillin)-resistant Staphylococcus aureus (MRSA). MRSA is predictably resistant to beta-lactam antibiotics (except ceftaroline). Preferred therapy is vancomycin unless clinically contraindicated. Patient requires contact precautions if  hospitalized. CRITICAL RESULT CALLED TO, READ BACK BY AND VERIFIED WITH: TO BGREEN(PHARMd) BY TCLEVELAND 11/14/16 AT 5:05AM    Methicillin resistance DETECTED (A) NOT DETECTED Final    Comment: CRITICAL RESULT CALLED TO, READ BACK BY AND VERIFIED WITH: TO BGREEN(PHARMd) BY TCLEVELAND 11/14/16 AT 5:05AM    Streptococcus species NOT DETECTED  NOT DETECTED Final   Streptococcus agalactiae NOT DETECTED NOT DETECTED Final   Streptococcus pneumoniae NOT DETECTED NOT DETECTED Final   Streptococcus pyogenes NOT DETECTED NOT DETECTED Final   Acinetobacter baumannii NOT DETECTED NOT DETECTED Final   Enterobacteriaceae species NOT DETECTED NOT DETECTED Final   Enterobacter cloacae complex NOT DETECTED NOT DETECTED Final   Escherichia coli NOT DETECTED NOT DETECTED Final   Klebsiella oxytoca NOT DETECTED NOT DETECTED Final   Klebsiella pneumoniae NOT DETECTED NOT DETECTED Final   Proteus species NOT DETECTED NOT DETECTED Final   Serratia marcescens NOT DETECTED NOT DETECTED Final   Haemophilus influenzae NOT DETECTED NOT DETECTED Final   Neisseria meningitidis NOT DETECTED NOT DETECTED Final   Pseudomonas aeruginosa NOT DETECTED NOT DETECTED Final   Candida albicans NOT DETECTED NOT DETECTED Final   Candida glabrata NOT DETECTED NOT DETECTED Final   Candida krusei NOT DETECTED NOT DETECTED Final   Candida parapsilosis NOT DETECTED NOT DETECTED Final   Candida tropicalis NOT DETECTED NOT DETECTED Final    Comment: Performed at [facility], [address] [place], [place] [address]  Culture, blood (Routine X 2) w Reflex to ID Panel     Status: None   Collection Time: 11/14/16 12:33 AM  Result Value Ref Range Status   Specimen Description  BLOOD RIGHT ANTECUBITAL  Final   Special Requests   Final    BOTTLES DRAWN AEROBIC ONLY Blood Culture adequate volume   Culture   Final    NO GROWTH 5 DAYS Performed at [facility], [address] [place], [place] [address]    Report Status 11/19/2016 FINAL  Final  Culture, blood (Routine X 2) w Reflex to ID Panel     Status: Abnormal   Collection Time: 11/14/16 12:33 AM  Result Value Ref Range Status   Specimen Description BLOOD LEFT HAND  Final   Special Requests IN PEDIATRIC BOTTLE Blood Culture adequate volume  Final   Culture  Setup Time   Final    GRAM POSITIVE COCCI IN CLUSTERS IN PEDIATRIC BOTTLE CRITICAL VALUE NOTED.  VALUE IS CONSISTENT WITH PREVIOUSLY REPORTED AND CALLED VALUE.    Culture (A)  Final    STAPHYLOCOCCUS AUREUS SUSCEPTIBILITIES PERFORMED ON PREVIOUS CULTURE WITHIN THE LAST 5 DAYS. Performed at [facility], [address] [place], [place] [address]    Report Status 11/16/2016 FINAL  Final  Culture, blood (Routine X 2) w Reflex to ID Panel     Status: Abnormal   Collection Time: 11/14/16  4:10 PM  Result Value Ref Range Status   Specimen Description BLOOD RIGHT ARM  Final   Special Requests IN PEDIATRIC BOTTLE Blood Culture adequate volume  Final   Culture  Setup Time   Final    IN PEDIATRIC BOTTLE GRAM POSITIVE COCCI IN CLUSTERS CRITICAL RESULT CALLED TO, READ BACK BY AND VERIFIED WITH: PHARMD A PHAM [id number] 1036AM MLM    Culture (A)  Final    STAPHYLOCOCCUS AUREUS SUSCEPTIBILITIES PERFORMED ON PREVIOUS CULTURE WITHIN THE LAST 5 DAYS. Performed at [facility], [address] [place], [place] [address]    Report Status 11/17/2016 FINAL  Final  Culture, blood (Routine X 2) w Reflex to ID Panel     Status: None   Collection Time: 11/14/16  4:10 PM  Result Value Ref Range Status   Specimen Description BLOOD RIGHT ARM  Final   Special Requests   Final    BOTTLES DRAWN AEROBIC  ONLY Blood Culture adequate volume   Culture   Final     NO GROWTH 5 DAYS Performed at [facility], [address], [place], [place] [address]    Report Status 11/19/2016 FINAL  Final  MRSA PCR Screening     Status: None   Collection Time: 11/16/16  5:49 PM  Result Value Ref Range Status   MRSA by PCR NEGATIVE NEGATIVE Final    Comment:        The GeneXpert MRSA Assay (FDA approved for NASAL specimens only), is one component of a comprehensive MRSA colonization surveillance program. It is not intended to diagnose MRSA infection nor to guide or monitor treatment for MRSA infections. DELTA CHECK NOTED   Body fluid culture (includes gram stain)     Status: None   Collection Time: 11/16/16  8:06 PM  Result Value Ref Range Status   Specimen Description PLEURAL FLUID  Final   Special Requests NONE  Final   Gram Stain   Final    FEW WBC PRESENT, PREDOMINANTLY PMN NO ORGANISMS SEEN    Culture   Final    FEW METHICILLIN RESISTANT STAPHYLOCOCCUS AUREUS RESULT CALLED TO, READ BACK BY AND VERIFIED WITH: R. MYRICK RN, AT 1023 11/18/16 BY D. VANHOOK REGARDING CULTURE GROWTH INTERPRET RESULTS WITH CAUTION DUE TO LIMITED SPECIMEN VOLUME Performed at [facility], [address] [place], [place] [address]    Report Status 11/20/2016 FINAL  Final   Organism ID, Bacteria METHICILLIN RESISTANT STAPHYLOCOCCUS AUREUS  Final      Susceptibility   Methicillin resistant staphylococcus aureus - MIC*    CIPROFLOXACIN >=8 RESISTANT Resistant     ERYTHROMYCIN >=8 RESISTANT Resistant     GENTAMICIN <=0.5 SENSITIVE Sensitive     OXACILLIN >=4 RESISTANT Resistant     TETRACYCLINE <=1 SENSITIVE Sensitive     VANCOMYCIN 1 SENSITIVE Sensitive     TRIMETH/SULFA <=10 SENSITIVE Sensitive     CLINDAMYCIN <=0.25 SENSITIVE Sensitive     RIFAMPIN <=0.5 SENSITIVE Sensitive     Inducible Clindamycin NEGATIVE Sensitive     * FEW METHICILLIN RESISTANT STAPHYLOCOCCUS AUREUS  Culture, blood (routine x 2)     Status: None (Preliminary result)   Collection  Time: 11/19/16  8:57 AM  Result Value Ref Range Status   Specimen Description BLOOD RIGHT ARM  Final   Special Requests IN PEDIATRIC BOTTLE Blood Culture adequate volume  Final   Culture NO GROWTH 1 DAY  Final   Report Status PENDING  Incomplete  Culture, blood (routine x 2)     Status: None (Preliminary result)   Collection Time: 11/19/16  9:05 AM  Result Value Ref Range Status   Specimen Description BLOOD RIGHT ANTECUBITAL  Final   Special Requests IN PEDIATRIC BOTTLE Blood Culture adequate volume  Final   Culture NO GROWTH 1 DAY  Final   Report Status PENDING  Incomplete    Impression/Plan:  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   She will need a prolonged therapy once blood cultures show clearance, 6 weeks through July 15 if current blood cultures remain negative.  2.  Empyema - also growing MRSA again.  On ceftaroline.  Continue with this and can use oral therapy such as doxycycline or Bactrim at discharge along with daptomycin as above.    3.  Access - she will not be able to go home with a picc  line and I discussed with her that when she is ready for discharge it will be to a SNF. Mother and patient understand and agree.    4.  IVDU - on suboxone

## 2016-11-22 LAB — CBC
HCT: 26.3 % — ABNORMAL LOW (ref 36.0–46.0)
HEMOGLOBIN: 8.5 g/dL — AB (ref 12.0–15.0)
MCH: 27.7 pg (ref 26.0–34.0)
MCHC: 32.3 g/dL (ref 30.0–36.0)
MCV: 85.7 fL (ref 78.0–100.0)
PLATELETS: 407 10*3/uL — AB (ref 150–400)
RBC: 3.07 MIL/uL — ABNORMAL LOW (ref 3.87–5.11)
RDW: 15.2 % (ref 11.5–15.5)
WBC: 7.4 10*3/uL (ref 4.0–10.5)

## 2016-11-22 LAB — BASIC METABOLIC PANEL
Anion gap: 9 (ref 5–15)
BUN: 9 mg/dL (ref 6–20)
CALCIUM: 8 mg/dL — AB (ref 8.9–10.3)
CHLORIDE: 105 mmol/L (ref 101–111)
CO2: 22 mmol/L (ref 22–32)
CREATININE: 0.84 mg/dL (ref 0.44–1.00)
Glucose, Bld: 88 mg/dL (ref 65–99)
Potassium: 3.7 mmol/L (ref 3.5–5.1)
SODIUM: 136 mmol/L (ref 135–145)

## 2016-11-22 NOTE — Progress Notes (Addendum)
[address]       [place] [address]             [phone]      6 Days Post-Op Procedure(s) (LRB): TRANSESOPHAGEAL ECHOCARDIOGRAM (TEE) (N/A) Subjective: Feels ok  Objective: Vital signs in last 24 hours: Temp:  [98.2 F (36.8 C)-99 F (37.2 C)] 98.6 F (37 C) (06/07 0824) Pulse Rate:  [74-84] 84 (06/07 0824) Cardiac Rhythm: Normal sinus rhythm (06/07 0824) Resp:  [16-24] 17 (06/07 0824) BP: (108-116)/(69-81) 114/78 (06/07 0824) SpO2:  [95 %-100 %] 95 % (06/07 0824) Weight:  [143 lb 8.3 oz (65.1 kg)] 143 lb 8.3 oz (65.1 kg) (06/07 0347)  Hemodynamic parameters for last 24 hours:    Intake/Output from previous day: 06/06 0701 - 06/07 0700 In: 2036.5 [P.O.:1160; I.V.:516.5; IV Piggyback:360] Out: 0  Intake/Output this shift: No intake/output data recorded.  General appearance: alert, cooperative and no distress Heart: regular rate and rhythm Lungs: mildly dim in the bases Abdomen: benign  Lab Results:  Recent Labs  11/21/16 0256 11/22/16 0231  WBC 8.8 7.4  HGB 8.2* 8.5*  HCT 25.7* 26.3*  PLT 411* 407*   BMET:  Recent Labs  11/21/16 0256 11/22/16 0231  NA 136 136  K 3.7 3.7  CL 106 105  CO2 22 22  GLUCOSE 93 88  BUN 9 9  CREATININE 0.95 0.84  CALCIUM 7.9* 8.0*    PT/INR: No results for input(s): LABPROT, INR in the last 72 hours. ABG    Component Value Date/Time   PHART 7.442 11/11/2016 0415   HCO3 21.6 11/11/2016 0415   TCO2 31 03/12/2016 1756   ACIDBASEDEF 1.7 11/11/2016 0415   O2SAT 98.9 11/11/2016 0415   CBG (last 3)   Recent Labs  11/19/16 1223  GLUCAP 83    Meds Scheduled Meds: . acetaminophen  1,000 mg Oral Q6H  . buprenorphine-naloxone  2 tablet Sublingual Daily  . busPIRone  10 mg Oral BID  . citalopram  10 mg Oral Daily  . gabapentin  300 mg Oral TID  . heparin  5,000 Units Subcutaneous Q8H  . nicotine  14 mg Transdermal Daily  . saccharomyces boulardii  250 mg Oral BID   Continuous  Infusions: . sodium chloride 250 mL (11/19/16 0700)  . ceFTAROline (TEFLARO) IV Stopped (11/22/16 0442)  . DAPTOmycin (CUBICIN)  IV Stopped (11/21/16 1755)   PRN Meds:.sodium chloride, albuterol, ALPRAZolam, docusate, nicotine polacrilex, ondansetron (ZOFRAN) IV, oxyCODONE  Xrays Dg Chest 2 View  Result Date: 11/21/2016 CLINICAL DATA:  Chest soreness.  Chest tube . EXAM: CHEST  2 VIEW COMPARISON:  CT 11/20/2016.  Chest x-ray 11/20/2016 FINDINGS: Chest tube noted on the right. Tiny right anterior hydropneumothorax again noted. No interim change . Stable cardiomegaly. Persistent bilateral pulmonary nodules including cavitary nodules. Reference is made to prior CT report. Small right pleural effusion. No pneumothorax. IMPRESSION: 1. Right chest tube in stable position. Tiny right anterior hydropneumothorax again noted. No interim change. 2. Multiple bilateral pulmonary nodules with cavitation. Reference is made to prior CT report of 11/20/2016 . Electronically Signed   By: Maisie Fus  Register   On: 11/21/2016 11:15   Ct Chest Wo Contrast  Result Date: 11/20/2016 CLINICAL DATA:  Follow-up right pleural effusion EXAM: CT CHEST WITHOUT CONTRAST TECHNIQUE: Multidetector CT imaging of the chest was performed following the standard protocol without IV contrast. COMPARISON:  Radiograph 11/20/2016, CT chest 11/10/2016, prior radiographs dating back to 11/10/2016 FINDINGS: Cardiovascular: Limited assessment without  intravenous contrast. Small fluid in the superior pericardial recess. There is cardiomegaly. Mediastinum/Nodes: Difficult evaluation without contrast. Mediastinal adenopathy is present right paratracheal soft tissue fullness, possible adenopathy does not appear significantly changed. Midline trachea. Esophagus grossly unremarkable. Lungs/Pleura: Interim placement of a right lower lateral chest tube since the prior CT with the tip terminating along the right upper posterior pleural surface. Small bilateral  pleural effusions right greater than left, slightly increased compared to the previous CT. Tiny right anterior hydropneumothorax. Multiple bilateral pulmonary nodules and cavitary lesions. Some of the cavitary lesions have decreased, however if there appears to be increased nodules/disease most notable in the left upper lobe and the right lower lobe. Decreased septal thickening compared to the previous exam. Upper Abdomen: Spleen appears enlarged. Partially visualized surgical clips in the gallbladder fossa. Musculoskeletal: No acute or suspicious bone lesion. IMPRESSION: 1. Interim placement of right-sided chest tube. Small residual pleural effusions right greater than left, mildly increased compared to the prior chest CT. Small right anterior hydropneumothorax. 2. Innumerable bilateral lung nodules and cavitary lesions, suspected to represent septic emboli. This appears increased in the left upper and right lower lobe since the prior CT. 3. Suspect mediastinal adenopathy although evaluation of the mediastinum is limited without contrast 4. Splenomegaly Electronically Signed   By: Jasmine Pang M.D.   On: 11/20/2016 21:25   Dg Chest Port 1 View  Result Date: 11/20/2016 CLINICAL DATA:  26 year old female with pneumothorax. Recent transesophageal echocardiogram. Subsequent encounter. EXAM: PORTABLE CHEST 1 VIEW COMPARISON:  11/20/2016 8:30 a.m. FINDINGS: Right-sided chest tube remains in place. No pneumothorax detected on this frontal projection. Cavitary lesions bilaterally. Left base consolidation. Atelectasis/pleural thickening right lung base. Cardiomegaly. IMPRESSION: No significant change since exam earlier today. Electronically Signed   By: Lacy Duverney M.D.   On: 11/20/2016 13:55   Results for orders placed or performed during the hospital encounter of 11/10/16  Blood Culture (routine x 2)     Status: Abnormal   Collection Time: 11/10/16  2:45 PM  Result Value Ref Range Status   Specimen Description  BLOOD LEFT HAND  Final   Special Requests IN PEDIATRIC BOTTLE Blood Culture adequate volume  Final   Culture  Setup Time   Final    GRAM POSITIVE COCCI IN CLUSTERS IN PEDIATRIC BOTTLE CRITICAL VALUE NOTED.  VALUE IS CONSISTENT WITH PREVIOUSLY REPORTED AND CALLED VALUE.    Culture (A)  Final    STAPHYLOCOCCUS AUREUS SUSCEPTIBILITIES PERFORMED ON PREVIOUS CULTURE WITHIN THE LAST 5 DAYS. Performed at [facility], [address], [place], [place] [address]    Report Status 11/13/2016 FINAL  Final  Blood Culture (routine x 2)     Status: Abnormal   Collection Time: 11/10/16  2:55 PM  Result Value Ref Range Status   Specimen Description BLOOD LEFT ARM  Final   Special Requests   Final    BOTTLES DRAWN AEROBIC AND ANAEROBIC Blood Culture adequate volume   Culture  Setup Time   Final    IN BOTH AEROBIC AND ANAEROBIC BOTTLES GRAM POSITIVE COCCI IN CLUSTERS CRITICAL RESULT CALLED TO, READ BACK BY AND VERIFIED WITH: TO JGRIMSLEY(PHARMd) BY TCLEVELAND 11/11/2016 AT 6:15AM Performed at [facility], [address], [place], [place] [address]    Culture METHICILLIN RESISTANT STAPHYLOCOCCUS AUREUS (A)  Final   Report Status 11/13/2016 FINAL  Final   Organism ID, Bacteria METHICILLIN RESISTANT STAPHYLOCOCCUS AUREUS  Final      Susceptibility   Methicillin resistant staphylococcus aureus -  MIC*    CIPROFLOXACIN >=8 RESISTANT Resistant     ERYTHROMYCIN >=8 RESISTANT Resistant     GENTAMICIN <=0.5 SENSITIVE Sensitive     OXACILLIN >=4 RESISTANT Resistant     TETRACYCLINE <=1 SENSITIVE Sensitive     VANCOMYCIN 1 SENSITIVE Sensitive     TRIMETH/SULFA <=10 SENSITIVE Sensitive     CLINDAMYCIN <=0.25 SENSITIVE Sensitive     RIFAMPIN <=0.5 SENSITIVE Sensitive     Inducible Clindamycin NEGATIVE Sensitive     * METHICILLIN RESISTANT STAPHYLOCOCCUS AUREUS  Blood Culture ID Panel (Reflexed)     Status: Abnormal   Collection Time: 11/10/16  2:55 PM  Result Value Ref Range Status    Enterococcus species NOT DETECTED NOT DETECTED Final   Listeria monocytogenes NOT DETECTED NOT DETECTED Final   Staphylococcus species DETECTED (A) NOT DETECTED Final    Comment: CRITICAL RESULT CALLED TO, READ BACK BY AND VERIFIED WITH: TO JGRIMSLEY(PHARMd) BY TCLEVELAND 11/11/2016 AT 6:15AM    Staphylococcus aureus DETECTED (A) NOT DETECTED Final    Comment: Methicillin (oxacillin)-resistant Staphylococcus aureus (MRSA). MRSA is predictably resistant to beta-lactam antibiotics (except ceftaroline). Preferred therapy is vancomycin unless clinically contraindicated. Patient requires contact precautions if  hospitalized. CRITICAL RESULT CALLED TO, READ BACK BY AND VERIFIED WITH: TO JGRIMSLEY(PHARMd) BY TCLEVELAND 11/11/2016 AT 6:15AM    Methicillin resistance DETECTED (A) NOT DETECTED Final    Comment: CRITICAL RESULT CALLED TO, READ BACK BY AND VERIFIED WITH: TO JGRIMSLEY(PHARMd) BY TCLEVELAND 11/11/2016 AT 6:15AM    Streptococcus species NOT DETECTED NOT DETECTED Final   Streptococcus agalactiae NOT DETECTED NOT DETECTED Final   Streptococcus pneumoniae NOT DETECTED NOT DETECTED Final   Streptococcus pyogenes NOT DETECTED NOT DETECTED Final   Acinetobacter baumannii NOT DETECTED NOT DETECTED Final   Enterobacteriaceae species NOT DETECTED NOT DETECTED Final   Enterobacter cloacae complex NOT DETECTED NOT DETECTED Final   Escherichia coli NOT DETECTED NOT DETECTED Final   Klebsiella oxytoca NOT DETECTED NOT DETECTED Final   Klebsiella pneumoniae NOT DETECTED NOT DETECTED Final   Proteus species NOT DETECTED NOT DETECTED Final   Serratia marcescens NOT DETECTED NOT DETECTED Final   Haemophilus influenzae NOT DETECTED NOT DETECTED Final   Neisseria meningitidis NOT DETECTED NOT DETECTED Final   Pseudomonas aeruginosa NOT DETECTED NOT DETECTED Final   Candida albicans NOT DETECTED NOT DETECTED Final   Candida glabrata NOT DETECTED NOT DETECTED Final   Candida krusei NOT DETECTED NOT  DETECTED Final   Candida parapsilosis NOT DETECTED NOT DETECTED Final   Candida tropicalis NOT DETECTED NOT DETECTED Final    Comment: Performed at [facility], [address] [place], [place] [address]  Urine culture     Status: None   Collection Time: 11/10/16  9:32 PM  Result Value Ref Range Status   Specimen Description URINE, CLEAN CATCH  Final   Special Requests NONE  Final   Culture   Final    NO GROWTH Performed at [facility], [address], [place], [place] [address]    Report Status 11/12/2016 FINAL  Final  Culture, blood (x 2)     Status: Abnormal   Collection Time: 11/10/16  9:46 PM  Result Value Ref Range Status   Specimen Description BLOOD LEFT ANTECUBITAL  Final   Special Requests IN PEDIATRIC BOTTLE Blood Culture adequate volume  Final   Culture  Setup Time   Final    GRAM POSITIVE COCCI IN CLUSTERS IN PEDIATRIC BOTTLE CRITICAL VALUE NOTED.  VALUE IS CONSISTENT  WITH PREVIOUSLY REPORTED AND CALLED VALUE.    Culture (A)  Final    STAPHYLOCOCCUS AUREUS SUSCEPTIBILITIES PERFORMED ON PREVIOUS CULTURE WITHIN THE LAST 5 DAYS. Performed at [facility], [address], [place], [place] [address]    Report Status 11/13/2016 FINAL  Final  Culture, blood (x 2)     Status: Abnormal   Collection Time: 11/10/16  9:46 PM  Result Value Ref Range Status   Specimen Description BLOOD LEFT HAND  Final   Special Requests IN PEDIATRIC BOTTLE Blood Culture adequate volume  Final   Culture  Setup Time   Final    GRAM POSITIVE COCCI IN CLUSTERS IN PEDIATRIC BOTTLE CRITICAL RESULT CALLED TO, READ BACK BY AND VERIFIED WITH: D WOFFORD,PHARMD AT 1353 11/11/16 BY L BENFIELD    Culture (A)  Final    STAPHYLOCOCCUS AUREUS SUSCEPTIBILITIES PERFORMED ON PREVIOUS CULTURE WITHIN THE LAST 5 DAYS. Performed at [facility], [address], [place], [place] [address]    Report Status 11/13/2016 FINAL  Final  MRSA PCR Screening     Status: Abnormal   Collection  Time: 11/10/16  9:58 PM  Result Value Ref Range Status   MRSA by PCR POSITIVE (A) NEGATIVE Final    Comment:        The GeneXpert MRSA Assay (FDA approved for NASAL specimens only), is one component of a comprehensive MRSA colonization surveillance program. It is not intended to diagnose MRSA infection nor to guide or monitor treatment for MRSA infections. RESULT CALLED TO, READ BACK BY AND VERIFIED WITH: ABBY CHAVEZ,RN [id number] @ 2357 BY J SCOTTON   Culture, blood (Routine X 2) w Reflex to ID Panel     Status: Abnormal   Collection Time: 11/11/16 10:30 AM  Result Value Ref Range Status   Specimen Description BLOOD RIGHT HAND  Final   Special Requests   Final    BOTTLES DRAWN AEROBIC AND ANAEROBIC Blood Culture adequate volume IMMUNOCOMPROMISED   Culture  Setup Time   Final    GRAM POSITIVE COCCI IN CLUSTERS IN BOTH AEROBIC AND ANAEROBIC BOTTLES CRITICAL VALUE NOTED.  VALUE IS CONSISTENT WITH PREVIOUSLY REPORTED AND CALLED VALUE.    Culture (A)  Final    STAPHYLOCOCCUS AUREUS SUSCEPTIBILITIES PERFORMED ON PREVIOUS CULTURE WITHIN THE LAST 5 DAYS. Performed at [facility], [address], [place], [place] [address]    Report Status 11/16/2016 FINAL  Final  Culture, blood (Routine X 2) w Reflex to ID Panel     Status: Abnormal   Collection Time: 11/11/16 10:34 AM  Result Value Ref Range Status   Specimen Description BLOOD BLOOD RIGHT HAND  Final   Special Requests   Final    BOTTLES DRAWN AEROBIC AND ANAEROBIC Blood Culture adequate volume   Culture  Setup Time   Final    GRAM POSITIVE COCCI IN CLUSTERS IN BOTH AEROBIC AND ANAEROBIC BOTTLES CRITICAL RESULT CALLED TO, READ BACK BY AND VERIFIED WITH: T. PICKERING PHARMD, AT 0702 11/12/16 BY D. VANHOOK    Culture (A)  Final    STAPHYLOCOCCUS AUREUS SUSCEPTIBILITIES PERFORMED ON PREVIOUS CULTURE WITHIN THE LAST 5 DAYS. Performed at [facility], [address], [place], [place] [address]    Report Status 11/14/2016  FINAL  Final  Culture, blood (Routine X 2) w Reflex to ID Panel     Status: Abnormal   Collection Time: 11/12/16  5:42 PM  Result Value Ref Range Status   Specimen Description BLOOD  RIGHT ARM  Final   Special Requests IN PEDIATRIC BOTTLE Blood Culture adequate volume  Final   Culture  Setup Time   Final    GRAM POSITIVE COCCI IN CLUSTERS IN PEDIATRIC BOTTLE CRITICAL RESULT CALLED TO, READ BACK BY AND VERIFIED WITH: N. GLOGOVAC PHARMD, AT 1610 11/13/16 BY D. VANHOOK    Culture (A)  Final    STAPHYLOCOCCUS AUREUS SUSCEPTIBILITIES PERFORMED ON PREVIOUS CULTURE WITHIN THE LAST 5 DAYS. Performed at [facility], [address], [place], [place] [address]    Report Status 11/15/2016 FINAL  Final  Culture, blood (Routine X 2) w Reflex to ID Panel     Status: Abnormal   Collection Time: 11/12/16  5:43 PM  Result Value Ref Range Status   Specimen Description BLOOD RIGHT ARM  Final   Special Requests   Final    BOTTLES DRAWN AEROBIC AND ANAEROBIC Blood Culture adequate volume   Culture  Setup Time   Final    GRAM POSITIVE COCCI IN CLUSTERS ANAEROBIC BOTTLE ONLY CRITICAL VALUE NOTED.  VALUE IS CONSISTENT WITH PREVIOUSLY REPORTED AND CALLED VALUE.    Culture (A)  Final    STAPHYLOCOCCUS AUREUS SUSCEPTIBILITIES PERFORMED ON PREVIOUS CULTURE WITHIN THE LAST 5 DAYS. Performed at [facility], [address], [place], [place] [address]    Report Status 11/15/2016 FINAL  Final  Culture, blood (Routine X 2) w Reflex to ID Panel     Status: None   Collection Time: 11/13/16 10:49 AM  Result Value Ref Range Status   Specimen Description BLOOD RIGHT ANTECUBITAL  Final   Special Requests   Final    BOTTLES DRAWN AEROBIC ONLY Blood Culture adequate volume   Culture   Final    NO GROWTH 5 DAYS Performed at [facility], [address], [place], [place] [address]    Report Status 11/18/2016 FINAL  Final  Culture, blood (Routine X 2) w Reflex to ID Panel     Status: Abnormal    Collection Time: 11/13/16 10:52 AM  Result Value Ref Range Status   Specimen Description BLOOD LEFT ARM  Final   Special Requests IN PEDIATRIC BOTTLE Blood Culture adequate volume  Final   Culture  Setup Time   Final    IN PEDIATRIC BOTTLE GRAM POSITIVE COCCI IN CLUSTERS Organism ID to follow CRITICAL RESULT CALLED TO, READ BACK BY AND VERIFIED WITH: TO BGREEN(PHARMd) BY TCLEVELAND 11/14/2016 AT 5:05AM    Culture (A)  Final    STAPHYLOCOCCUS AUREUS SUSCEPTIBILITIES PERFORMED ON PREVIOUS CULTURE WITHIN THE LAST 5 DAYS. Performed at [facility], [address] [place], [place] [address]    Report Status 11/16/2016 FINAL  Final  Blood Culture ID Panel (Reflexed)     Status: Abnormal   Collection Time: 11/13/16 10:52 AM  Result Value Ref Range Status   Enterococcus species NOT DETECTED NOT DETECTED Final   Listeria monocytogenes NOT DETECTED NOT DETECTED Final   Staphylococcus species DETECTED (A) NOT DETECTED Final    Comment: CRITICAL RESULT CALLED TO, READ BACK BY AND VERIFIED WITH: TO BGREEN(PHARMd) BY TCLEVELAND 11/14/16 AT 5:05AM    Staphylococcus aureus DETECTED (A) NOT DETECTED Final    Comment: Methicillin (oxacillin)-resistant Staphylococcus aureus (MRSA). MRSA is predictably resistant to beta-lactam antibiotics (except ceftaroline). Preferred therapy is vancomycin unless clinically contraindicated. Patient requires contact precautions if  hospitalized. CRITICAL RESULT CALLED TO, READ BACK BY AND VERIFIED WITH: TO BGREEN(PHARMd) BY TCLEVELAND 11/14/16 AT 5:05AM  Methicillin resistance DETECTED (A) NOT DETECTED Final    Comment: CRITICAL RESULT CALLED TO, READ BACK BY AND VERIFIED WITH: TO BGREEN(PHARMd) BY TCLEVELAND 11/14/16 AT 5:05AM    Streptococcus species NOT DETECTED NOT DETECTED Final   Streptococcus agalactiae NOT DETECTED NOT DETECTED Final   Streptococcus pneumoniae NOT DETECTED NOT DETECTED Final   Streptococcus pyogenes NOT DETECTED NOT DETECTED Final    Acinetobacter baumannii NOT DETECTED NOT DETECTED Final   Enterobacteriaceae species NOT DETECTED NOT DETECTED Final   Enterobacter cloacae complex NOT DETECTED NOT DETECTED Final   Escherichia coli NOT DETECTED NOT DETECTED Final   Klebsiella oxytoca NOT DETECTED NOT DETECTED Final   Klebsiella pneumoniae NOT DETECTED NOT DETECTED Final   Proteus species NOT DETECTED NOT DETECTED Final   Serratia marcescens NOT DETECTED NOT DETECTED Final   Haemophilus influenzae NOT DETECTED NOT DETECTED Final   Neisseria meningitidis NOT DETECTED NOT DETECTED Final   Pseudomonas aeruginosa NOT DETECTED NOT DETECTED Final   Candida albicans NOT DETECTED NOT DETECTED Final   Candida glabrata NOT DETECTED NOT DETECTED Final   Candida krusei NOT DETECTED NOT DETECTED Final   Candida parapsilosis NOT DETECTED NOT DETECTED Final   Candida tropicalis NOT DETECTED NOT DETECTED Final    Comment: Performed at [facility], [address] [place], [place] [address]  Culture, blood (Routine X 2) w Reflex to ID Panel     Status: None   Collection Time: 11/14/16 12:33 AM  Result Value Ref Range Status   Specimen Description BLOOD RIGHT ANTECUBITAL  Final   Special Requests   Final    BOTTLES DRAWN AEROBIC ONLY Blood Culture adequate volume   Culture   Final    NO GROWTH 5 DAYS Performed at [facility], [address] [place], [place] [address]    Report Status 11/19/2016 FINAL  Final  Culture, blood (Routine X 2) w Reflex to ID Panel     Status: Abnormal   Collection Time: 11/14/16 12:33 AM  Result Value Ref Range Status   Specimen Description BLOOD LEFT HAND  Final   Special Requests IN PEDIATRIC BOTTLE Blood Culture adequate volume  Final   Culture  Setup Time   Final    GRAM POSITIVE COCCI IN CLUSTERS IN PEDIATRIC BOTTLE CRITICAL VALUE NOTED.  VALUE IS CONSISTENT WITH PREVIOUSLY REPORTED AND CALLED VALUE.    Culture (A)  Final    STAPHYLOCOCCUS AUREUS SUSCEPTIBILITIES PERFORMED ON  PREVIOUS CULTURE WITHIN THE LAST 5 DAYS. Performed at [facility], [address] [place], [place] [address]    Report Status 11/16/2016 FINAL  Final  Culture, blood (Routine X 2) w Reflex to ID Panel     Status: Abnormal   Collection Time: 11/14/16  4:10 PM  Result Value Ref Range Status   Specimen Description BLOOD RIGHT ARM  Final   Special Requests IN PEDIATRIC BOTTLE Blood Culture adequate volume  Final   Culture  Setup Time   Final    IN PEDIATRIC BOTTLE GRAM POSITIVE COCCI IN CLUSTERS CRITICAL RESULT CALLED TO, READ BACK BY AND VERIFIED WITH: PHARMD A PHAM [id number] 1036AM MLM    Culture (A)  Final    STAPHYLOCOCCUS AUREUS SUSCEPTIBILITIES PERFORMED ON PREVIOUS CULTURE WITHIN THE LAST 5 DAYS. Performed at [facility], [address], [place], [place] [address]    Report Status 11/17/2016 FINAL  Final  Culture, blood (Routine X 2) w Reflex to ID Panel     Status: None  Collection Time: 11/14/16  4:10 PM  Result Value Ref Range Status   Specimen Description BLOOD RIGHT ARM  Final   Special Requests   Final    BOTTLES DRAWN AEROBIC ONLY Blood Culture adequate volume   Culture   Final    NO GROWTH 5 DAYS Performed at [facility], [address] [place], [place] [address]    Report Status 11/19/2016 FINAL  Final  MRSA PCR Screening     Status: None   Collection Time: 11/16/16  5:49 PM  Result Value Ref Range Status   MRSA by PCR NEGATIVE NEGATIVE Final    Comment:        The GeneXpert MRSA Assay (FDA approved for NASAL specimens only), is one component of a comprehensive MRSA colonization surveillance program. It is not intended to diagnose MRSA infection nor to guide or monitor treatment for MRSA infections. DELTA CHECK NOTED   Body fluid culture (includes gram stain)     Status: None   Collection Time: 11/16/16  8:06 PM  Result Value Ref Range Status   Specimen Description PLEURAL FLUID  Final   Special Requests NONE  Final   Gram Stain    Final    FEW WBC PRESENT, PREDOMINANTLY PMN NO ORGANISMS SEEN    Culture   Final    FEW METHICILLIN RESISTANT STAPHYLOCOCCUS AUREUS RESULT CALLED TO, READ BACK BY AND VERIFIED WITH: R. MYRICK RN, AT 1023 11/18/16 BY D. VANHOOK REGARDING CULTURE GROWTH INTERPRET RESULTS WITH CAUTION DUE TO LIMITED SPECIMEN VOLUME Performed at [facility], [address] [place], [place] [address]    Report Status 11/20/2016 FINAL  Final   Organism ID, Bacteria METHICILLIN RESISTANT STAPHYLOCOCCUS AUREUS  Final      Susceptibility   Methicillin resistant staphylococcus aureus - MIC*    CIPROFLOXACIN >=8 RESISTANT Resistant     ERYTHROMYCIN >=8 RESISTANT Resistant     GENTAMICIN <=0.5 SENSITIVE Sensitive     OXACILLIN >=4 RESISTANT Resistant     TETRACYCLINE <=1 SENSITIVE Sensitive     VANCOMYCIN 1 SENSITIVE Sensitive     TRIMETH/SULFA <=10 SENSITIVE Sensitive     CLINDAMYCIN <=0.25 SENSITIVE Sensitive     RIFAMPIN <=0.5 SENSITIVE Sensitive     Inducible Clindamycin NEGATIVE Sensitive     * FEW METHICILLIN RESISTANT STAPHYLOCOCCUS AUREUS  Culture, blood (routine x 2)     Status: None (Preliminary result)   Collection Time: 11/19/16  8:57 AM  Result Value Ref Range Status   Specimen Description BLOOD RIGHT ARM  Final   Special Requests IN PEDIATRIC BOTTLE Blood Culture adequate volume  Final   Culture NO GROWTH 2 DAYS  Final   Report Status PENDING  Incomplete  Culture, blood (routine x 2)     Status: None (Preliminary result)   Collection Time: 11/19/16  9:05 AM  Result Value Ref Range Status   Specimen Description BLOOD RIGHT ANTECUBITAL  Final   Special Requests IN PEDIATRIC BOTTLE Blood Culture adequate volume  Final   Culture NO GROWTH 2 DAYS  Final   Report Status PENDING  Incomplete    Assessment/Plan: S/P Procedure(s) (LRB): TRANSESOPHAGEAL ECHOCARDIOGRAM (TEE) (N/A)  1 stable overall 2 CXR without significant change and no air leak , minimal drainage- d/c today 3 she is  stable on low dose oxy- hopefully can stop soon- 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  D/C tube  Purcell Nails, MD 11/22/2016 3:13 PM

## 2016-11-22 NOTE — Progress Notes (Signed)
[facility]                                                                              Patient Demographics  Savannah Palmer, is a 26 y.o. female, DOB - May 02, 1991, ZOX:[id number]  Admit date - 11/10/2016   Admitting Physician Coralyn HellingVineet Sood, MD  Outpatient Primary MD for the patient is Patient, No Pcp Per  Outpatient specialists:   LOS - 12  days   Medical records reviewed and are as summarized below:    No chief complaint on file.      Brief summary    26 year old Caucasian female with a past medical history of anxiety, depression, heroin abuse, presented with weakness, cough and chest pain. Initially was in septic shock and required pressors. Blood cultures grew MRSA. 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 She was placed on IV antibiotics. She also developed a spontaneous right-sided pneumothorax. Chest tube was placed by critical care medicine. [facility] from cardiothoracic surgery. Patient is not a candidate for surgery at this time.   Assessment & Plan    Principal Problem: MRSA bacteremia/Septic shock with right TV endocarditis and septic emboli to lungs/MRSA empyema - Patient presented with weakness, coughing and chest pain and septic shock requiring vasopressors. Blood cultures 5/27 grew out MRSA and was found to have septic emboli throughout her lungs.  -Repeat cultures were still positive, blood cultures repeat on 6/4 has been negative to date  -ID has been following closely and patient was transitioned to Teflaro on 5/31 due to persistent fevers.  - 2-D echo on 5/27 showed tricuspid vegetations with moderate TR. TEE on 6/1 showed large tricuspid vegetation, severe TR with dilated RV along with a loculated left effusion -HIV, Hep C negative -Due to family request, RPR was doneand it was negative.   -Due to persistent bacteremia  patient was started on daptomycin 6/2. Ceftaroline also being continued to cover lung infection. - cardiothoracic surgery was also consulted, not a candidate for surgery at this time.  -Eventually will need PICC line after negative cultures and antibiotics for total 6 weeks, however she will need to be discharged to skilled nursing facility due to history of IV drug use  Active problems: Right spontaneous pneumothorax- acute respiratory failure/MRSA Empyema -Right empyema with pneumothorax, positive for MRSA. Status post chest tube. - CT surgery following closely, chest x-ray without significant change and no air leak, minimal drainage, chest tube removed today   Acute kidney injury -Baseline creatinine 0.7, creatinine increased to 1.37, likely secondary to dehydration and NSAIDs. - patient received IV fluids, NSAIDs were discontinued, and creatinine function has normalized.   RUQ pain, likely musculoskeletal -CT abdomen and pelvis showed no liver or gallbladder abnormalities, possible musculoskeletal origin, currently stable.    Ascites, anasarca due to right sided hear failure - as a result of severe TR - limit IVF, hold off on administering diuretics in setting of sepsis and borderline BPs -Still net positive balance of 12.3 L  Splenomegaly - etiology undetermined- CT on 5/30 revealed that  the spleen is enlarged measuring 15.7 x 5.6 x 17 cm (volume= 780 cm^3) - liver is unremarkable on CT  Normocytic Anemia - H&H currently stable, was transfused on 6/5, no overt bleeding noted - Iron was 12. Ferritin 213. B-12 1850. Folate 7.8.  Hypokalemia/Hypophosphatemia/Hypomagnesemia - Resolved  Thrombocytopenia - Resolved  Heroin abuse - cont Suboxone- will need transition to a drug rehab program once stable.   Anxiety - cont Buspar, Neurontin, Xanax  Nicotine dependence -Continue  Nicoderm patch   Code Status: Full CODE STATUS  DVT Prophylaxis:  heparin  Family  Communication: Discussed in detail with the patient, all imaging results, lab results explained to the patient    Disposition Plan:   Time Spent in minutes  35 minutes  Procedures:  TEE  - Left ventricle: The cavity size was normal. Wall thickness was normal. Systolic function was normal. The estimated ejection fraction was in the range of 55% to 60%. Wall motion was normal; there were no regional wall motion abnormalities. - Left atrium: No evidence of thrombus in the atrial cavity or appendage. - Right ventricle: The cavity size was mildly dilated. Wall thickness was normal. - Right atrium: No evidence of thrombus in the atrial cavity or appendage. - Tricuspid valve: Flail motion of the posterior leaflet. There is a 8x5 mm vegetation attached to the posterior leaflet, but most of the &quot;mass&quot; described on transthoracic imaging appears to be the flail segment of the valve. There was severe regurgitation directed eccentrically and toward the free wall.  Right chest tube placement for pneumothorax 6/1  Placement of a larger bore chest tube in the right chest 6/3  Consultants:   Infectious disease Cardiothoracic surgery  Antimicrobials:   Daptomycin 6/2>  Teflaro 5/31>   Medications  Scheduled Meds: . acetaminophen  1,000 mg Oral Q6H  . buprenorphine-naloxone  2 tablet Sublingual Daily  . busPIRone  10 mg Oral BID  . citalopram  10 mg Oral Daily  . gabapentin  300 mg Oral TID  . heparin  5,000 Units Subcutaneous Q8H  . nicotine  14 mg Transdermal Daily  . saccharomyces boulardii  250 mg Oral BID   Continuous Infusions: . sodium chloride 250 mL (11/19/16 0700)  . ceFTAROline (TEFLARO) IV 600 mg (11/22/16 1500)  . DAPTOmycin (CUBICIN)  IV Stopped (11/21/16 1755)   PRN Meds:.sodium chloride, albuterol, ALPRAZolam, docusate, nicotine polacrilex, ondansetron (ZOFRAN) IV, oxyCODONE   Antibiotics   Anti-infectives    Start      Dose/Rate Route Frequency Ordered Stop   11/17/16 1600  DAPTOmycin (CUBICIN) 500 mg in sodium chloride 0.9 % IVPB     500 mg 220 mL/hr over 30 Minutes Intravenous Every 24 hours 11/17/16 1432     11/17/16 1500  ceftaroline (TEFLARO) 600 mg in sodium chloride 0.9 % 250 mL IVPB     600 mg 250 mL/hr over 60 Minutes Intravenous Every 12 hours 11/17/16 1435     11/15/16 1800  ceftaroline (TEFLARO) 600 mg in sodium chloride 0.9 % 250 mL IVPB  Status:  Discontinued     600 mg 250 mL/hr over 60 Minutes Intravenous Every 12 hours 11/15/16 1635 11/17/16 1431   11/13/16 1215  vancomycin (VANCOCIN) IVPB 1000 mg/200 mL premix  Status:  Discontinued     1,000 mg 200 mL/hr over 60 Minutes Intravenous Every 8 hours 11/13/16 1203 11/16/16 0850   11/11/16 1100  vancomycin (VANCOCIN) IVPB 750 mg/150 ml premix  Status:  Discontinued     750 mg 150  mL/hr over 60 Minutes Intravenous Every 8 hours 11/11/16 0956 11/13/16 1203   11/11/16 0400  vancomycin (VANCOCIN) 500 mg in sodium chloride 0.9 % 100 mL IVPB  Status:  Discontinued     500 mg 100 mL/hr over 60 Minutes Intravenous Every 12 hours 11/10/16 1951 11/11/16 0956   11/10/16 2200  ceFEPIme (MAXIPIME) 1 g in dextrose 5 % 50 mL IVPB  Status:  Discontinued     1 g 100 mL/hr over 30 Minutes Intravenous Every 12 hours 11/10/16 1951 11/11/16 0926   11/10/16 2000  ceFEPIme (MAXIPIME) 2 g in dextrose 5 % 50 mL IVPB  Status:  Discontinued     2 g 100 mL/hr over 30 Minutes Intravenous  Once 11/10/16 1952 11/10/16 1954   11/10/16 2000  vancomycin (VANCOCIN) IVPB 1000 mg/200 mL premix  Status:  Discontinued     1,000 mg 200 mL/hr over 60 Minutes Intravenous  Once 11/10/16 1952 11/10/16 1954   11/10/16 1430  piperacillin-tazobactam (ZOSYN) IVPB 3.375 g     3.375 g 100 mL/hr over 30 Minutes Intravenous  Once 11/10/16 1426 11/10/16 1503   11/10/16 1430  vancomycin (VANCOCIN) IVPB 1000 mg/200 mL premix     1,000 mg 200 mL/hr over 60 Minutes Intravenous  Once 11/10/16  1426 11/10/16 1653        Subjective:   Savannah Devon was seen and examined today. Denies any specific complaints at this time. Chest tube removed today. No chest pain or fevers or chills. Patient denies dizziness,  shortness of breath, abdominal pain, N/V/D/C, new weakness, numbess, tingling. No acute events overnight.    Objective:   Vitals:   11/22/16 0347 11/22/16 0451 11/22/16 0824 11/22/16 1144  BP:  116/69 114/78 111/69  Pulse:  84 84 88  Resp:  (!) 24 17 18   Temp:  98.5 F (36.9 C) 98.6 F (37 C) 98 F (36.7 C)  TempSrc:  Oral Oral Oral  SpO2:  97% 95% 96%  Weight: 65.1 kg (143 lb 8.3 oz)     Height:        Intake/Output Summary (Last 24 hours) at 11/22/16 1606 Last data filed at 11/22/16 1438  Gross per 24 hour  Intake           2076.5 ml  Output                0 ml  Net           2076.5 ml     Wt Readings from Last 3 Encounters:  11/22/16 65.1 kg (143 lb 8.3 oz)  03/14/16 58.5 kg (129 lb)  01/19/16 58.1 kg (128 lb)     Exam  General: Alert and oriented x 3, NAD  Eyes: PERRLA, EOMI, Anicteric Sclera,  HEENT:  Atraumatic, normocephalic, normal oropharynx  Cardiovascular: S1 S2 auscultated,Systolic murmur in the tricuspid area, minimal pedal edema   Respiratory: Clear to auscultation bilaterally, no wheezing, rales or rhonchi  Gastrointestinal: Soft, nontender, nondistended, + bowel sounds  WUJ:WJXB  pedal edema bilaterally  Neuro: AAOx3, Cr N's II- XII. Strength 5/5 upper and lower extremities bilaterally, speech clear, sensations grossly intact  Musculoskeletal: No digital cyanosis, clubbing  Skin: No rashes  Psych: Normal affect and demeanor, alert and oriented x3    Data Reviewed:  I have personally reviewed following labs and imaging studies  Micro Results Recent Results (from the past 240 hour(s))  Culture, blood (Routine X 2) w Reflex to ID Panel     Status: Abnormal  Collection Time: 11/12/16  5:42 PM  Result Value Ref  Range Status   Specimen Description BLOOD RIGHT ARM  Final   Special Requests IN PEDIATRIC BOTTLE Blood Culture adequate volume  Final   Culture  Setup Time   Final    GRAM POSITIVE COCCI IN CLUSTERS IN PEDIATRIC BOTTLE CRITICAL RESULT CALLED TO, READ BACK BY AND VERIFIED WITH: N. GLOGOVAC PHARMD, AT 1610 11/13/16 BY D. VANHOOK    Culture (A)  Final    STAPHYLOCOCCUS AUREUS SUSCEPTIBILITIES PERFORMED ON PREVIOUS CULTURE WITHIN THE LAST 5 DAYS. Performed at [facility], [address], [place], [place] [address]    Report Status 11/15/2016 FINAL  Final  Culture, blood (Routine X 2) w Reflex to ID Panel     Status: Abnormal   Collection Time: 11/12/16  5:43 PM  Result Value Ref Range Status   Specimen Description BLOOD RIGHT ARM  Final   Special Requests   Final    BOTTLES DRAWN AEROBIC AND ANAEROBIC Blood Culture adequate volume   Culture  Setup Time   Final    GRAM POSITIVE COCCI IN CLUSTERS ANAEROBIC BOTTLE ONLY CRITICAL VALUE NOTED.  VALUE IS CONSISTENT WITH PREVIOUSLY REPORTED AND CALLED VALUE.    Culture (A)  Final    STAPHYLOCOCCUS AUREUS SUSCEPTIBILITIES PERFORMED ON PREVIOUS CULTURE WITHIN THE LAST 5 DAYS. Performed at [facility], [address], [place], [place] [address]    Report Status 11/15/2016 FINAL  Final  Culture, blood (Routine X 2) w Reflex to ID Panel     Status: None   Collection Time: 11/13/16 10:49 AM  Result Value Ref Range Status   Specimen Description BLOOD RIGHT ANTECUBITAL  Final   Special Requests   Final    BOTTLES DRAWN AEROBIC ONLY Blood Culture adequate volume   Culture   Final    NO GROWTH 5 DAYS Performed at [facility], [address], [place], [place] [address]    Report Status 11/18/2016 FINAL  Final  Culture, blood (Routine X 2) w Reflex to ID Panel     Status: Abnormal   Collection Time: 11/13/16 10:52 AM  Result Value Ref Range Status   Specimen Description BLOOD LEFT ARM  Final   Special Requests IN PEDIATRIC  BOTTLE Blood Culture adequate volume  Final   Culture  Setup Time   Final    IN PEDIATRIC BOTTLE GRAM POSITIVE COCCI IN CLUSTERS Organism ID to follow CRITICAL RESULT CALLED TO, READ BACK BY AND VERIFIED WITH: TO BGREEN(PHARMd) BY TCLEVELAND 11/14/2016 AT 5:05AM    Culture (A)  Final    STAPHYLOCOCCUS AUREUS SUSCEPTIBILITIES PERFORMED ON PREVIOUS CULTURE WITHIN THE LAST 5 DAYS. Performed at [facility], [address], [place], [place] [address]    Report Status 11/16/2016 FINAL  Final  Blood Culture ID Panel (Reflexed)     Status: Abnormal   Collection Time: 11/13/16 10:52 AM  Result Value Ref Range Status   Enterococcus species NOT DETECTED NOT DETECTED Final   Listeria monocytogenes NOT DETECTED NOT DETECTED Final   Staphylococcus species DETECTED (A) NOT DETECTED Final    Comment: CRITICAL RESULT CALLED TO, READ BACK BY AND VERIFIED WITH: TO BGREEN(PHARMd) BY TCLEVELAND 11/14/16 AT 5:05AM    Staphylococcus aureus DETECTED (A) NOT DETECTED Final    Comment: Methicillin (oxacillin)-resistant Staphylococcus aureus (MRSA). MRSA is predictably resistant to beta-lactam antibiotics (except ceftaroline). Preferred therapy is vancomycin unless clinically contraindicated. Patient requires contact precautions if  hospitalized. CRITICAL  RESULT CALLED TO, READ BACK BY AND VERIFIED WITH: TO BGREEN(PHARMd) BY TCLEVELAND 11/14/16 AT 5:05AM    Methicillin resistance DETECTED (A) NOT DETECTED Final    Comment: CRITICAL RESULT CALLED TO, READ BACK BY AND VERIFIED WITH: TO BGREEN(PHARMd) BY TCLEVELAND 11/14/16 AT 5:05AM    Streptococcus species NOT DETECTED NOT DETECTED Final   Streptococcus agalactiae NOT DETECTED NOT DETECTED Final   Streptococcus pneumoniae NOT DETECTED NOT DETECTED Final   Streptococcus pyogenes NOT DETECTED NOT DETECTED Final   Acinetobacter baumannii NOT DETECTED NOT DETECTED Final   Enterobacteriaceae species NOT DETECTED NOT DETECTED Final   Enterobacter cloacae  complex NOT DETECTED NOT DETECTED Final   Escherichia coli NOT DETECTED NOT DETECTED Final   Klebsiella oxytoca NOT DETECTED NOT DETECTED Final   Klebsiella pneumoniae NOT DETECTED NOT DETECTED Final   Proteus species NOT DETECTED NOT DETECTED Final   Serratia marcescens NOT DETECTED NOT DETECTED Final   Haemophilus influenzae NOT DETECTED NOT DETECTED Final   Neisseria meningitidis NOT DETECTED NOT DETECTED Final   Pseudomonas aeruginosa NOT DETECTED NOT DETECTED Final   Candida albicans NOT DETECTED NOT DETECTED Final   Candida glabrata NOT DETECTED NOT DETECTED Final   Candida krusei NOT DETECTED NOT DETECTED Final   Candida parapsilosis NOT DETECTED NOT DETECTED Final   Candida tropicalis NOT DETECTED NOT DETECTED Final    Comment: Performed at [facility], [address], [place], [place] [address]  Culture, blood (Routine X 2) w Reflex to ID Panel     Status: None   Collection Time: 11/14/16 12:33 AM  Result Value Ref Range Status   Specimen Description BLOOD RIGHT ANTECUBITAL  Final   Special Requests   Final    BOTTLES DRAWN AEROBIC ONLY Blood Culture adequate volume   Culture   Final    NO GROWTH 5 DAYS Performed at [facility], [address] [place], [place] [address]    Report Status 11/19/2016 FINAL  Final  Culture, blood (Routine X 2) w Reflex to ID Panel     Status: Abnormal   Collection Time: 11/14/16 12:33 AM  Result Value Ref Range Status   Specimen Description BLOOD LEFT HAND  Final   Special Requests IN PEDIATRIC BOTTLE Blood Culture adequate volume  Final   Culture  Setup Time   Final    GRAM POSITIVE COCCI IN CLUSTERS IN PEDIATRIC BOTTLE CRITICAL VALUE NOTED.  VALUE IS CONSISTENT WITH PREVIOUSLY REPORTED AND CALLED VALUE.    Culture (A)  Final    STAPHYLOCOCCUS AUREUS SUSCEPTIBILITIES PERFORMED ON PREVIOUS CULTURE WITHIN THE LAST 5 DAYS. Performed at [facility], [address] [place], [place] [address]    Report Status 11/16/2016  FINAL  Final  Culture, blood (Routine X 2) w Reflex to ID Panel     Status: Abnormal   Collection Time: 11/14/16  4:10 PM  Result Value Ref Range Status   Specimen Description BLOOD RIGHT ARM  Final   Special Requests IN PEDIATRIC BOTTLE Blood Culture adequate volume  Final   Culture  Setup Time   Final    IN PEDIATRIC BOTTLE GRAM POSITIVE COCCI IN CLUSTERS CRITICAL RESULT CALLED TO, READ BACK BY AND VERIFIED WITH: PHARMD A PHAM [id number] MLM    Culture (A)  Final    STAPHYLOCOCCUS AUREUS SUSCEPTIBILITIES PERFORMED ON PREVIOUS CULTURE WITHIN THE LAST 5 DAYS. Performed at [facility], [address], [place], [place] [address]    Report Status 11/17/2016 FINAL  Final  Culture, blood (Routine X 2) w Reflex to ID Panel     Status: None   Collection Time: 11/14/16  4:10 PM  Result Value Ref Range Status   Specimen Description BLOOD RIGHT ARM  Final   Special Requests   Final    BOTTLES DRAWN AEROBIC ONLY Blood Culture adequate volume   Culture   Final    NO GROWTH 5 DAYS Performed at [facility], [address] [place], [place] [address]    Report Status 11/19/2016 FINAL  Final  MRSA PCR Screening     Status: None   Collection Time: 11/16/16  5:49 PM  Result Value Ref Range Status   MRSA by PCR NEGATIVE NEGATIVE Final    Comment:        The GeneXpert MRSA Assay (FDA approved for NASAL specimens only), is one component of a comprehensive MRSA colonization surveillance program. It is not intended to diagnose MRSA infection nor to guide or monitor treatment for MRSA infections. DELTA CHECK NOTED   Body fluid culture (includes gram stain)     Status: None   Collection Time: 11/16/16  8:06 PM  Result Value Ref Range Status   Specimen Description PLEURAL FLUID  Final   Special Requests NONE  Final   Gram Stain   Final    FEW WBC PRESENT, PREDOMINANTLY PMN NO ORGANISMS SEEN    Culture   Final    FEW METHICILLIN RESISTANT STAPHYLOCOCCUS AUREUS RESULT  CALLED TO, READ BACK BY AND VERIFIED WITH: R. MYRICK RN, AT 1023 11/18/16 BY D. VANHOOK REGARDING CULTURE GROWTH INTERPRET RESULTS WITH CAUTION DUE TO LIMITED SPECIMEN VOLUME Performed at [facility], [address] [place], [place] [address]    Report Status 11/20/2016 FINAL  Final   Organism ID, Bacteria METHICILLIN RESISTANT STAPHYLOCOCCUS AUREUS  Final      Susceptibility   Methicillin resistant staphylococcus aureus - MIC*    CIPROFLOXACIN >=8 RESISTANT Resistant     ERYTHROMYCIN >=8 RESISTANT Resistant     GENTAMICIN <=0.5 SENSITIVE Sensitive     OXACILLIN >=4 RESISTANT Resistant     TETRACYCLINE <=1 SENSITIVE Sensitive     VANCOMYCIN 1 SENSITIVE Sensitive     TRIMETH/SULFA <=10 SENSITIVE Sensitive     CLINDAMYCIN <=0.25 SENSITIVE Sensitive     RIFAMPIN <=0.5 SENSITIVE Sensitive     Inducible Clindamycin NEGATIVE Sensitive     * FEW METHICILLIN RESISTANT STAPHYLOCOCCUS AUREUS  Culture, blood (routine x 2)     Status: None (Preliminary result)   Collection Time: 11/19/16  8:57 AM  Result Value Ref Range Status   Specimen Description BLOOD RIGHT ARM  Final   Special Requests IN PEDIATRIC BOTTLE Blood Culture adequate volume  Final   Culture NO GROWTH 3 DAYS  Final   Report Status PENDING  Incomplete  Culture, blood (routine x 2)     Status: None (Preliminary result)   Collection Time: 11/19/16  9:05 AM  Result Value Ref Range Status   Specimen Description BLOOD RIGHT ANTECUBITAL  Final   Special Requests IN PEDIATRIC BOTTLE Blood Culture adequate volume  Final   Culture NO GROWTH 3 DAYS  Final   Report Status PENDING  Incomplete    Radiology Reports Dg Chest 2 View  Result Date: 11/21/2016 CLINICAL DATA:  Chest soreness.  Chest tube . EXAM: CHEST  2 VIEW COMPARISON:  CT 11/20/2016.  Chest x-ray 11/20/2016 FINDINGS: Chest tube noted on the right. Tiny right anterior hydropneumothorax again noted.  No interim change . Stable cardiomegaly. Persistent bilateral pulmonary  nodules including cavitary nodules. Reference is made to prior CT report. Small right pleural effusion. No pneumothorax. IMPRESSION: 1. Right chest tube in stable position. Tiny right anterior hydropneumothorax again noted. No interim change. 2. Multiple bilateral pulmonary nodules with cavitation. Reference is made to prior CT report of 11/20/2016 . Electronically Signed   By: Maisie Fus  Register   On: 11/21/2016 11:15   Dg Chest 2 View  Result Date: 11/19/2016 CLINICAL DATA:  Pneumothorax. EXAM: CHEST  2 VIEW COMPARISON:  11/19/2016. FINDINGS: Right chest tube in stable position. No significant pneumothorax noted on today's exam. Small right pleural effusion. Bilateral patchy pulmonary infiltrates with cavitation again noted. Small left pleural effusion noted. Heart size stable. No acute bony abnormality . IMPRESSION: 1. Right chest tube in stable position. No pneumothorax noted on today's exam. Small right pleural effusion. 2. Persistent bilateral cavitary pulmonary infiltrates. Small left pleural effusion. Electronically Signed   By: Maisie Fus  Register   On: 11/19/2016 16:53   Dg Chest 2 View  Result Date: 11/10/2016 CLINICAL DATA:  Chest pain, shortness of breath for 1 week EXAM: CHEST  2 VIEW COMPARISON:  CT chest 03/12/2016, chest x-ray 03/12/2016 FINDINGS: There are bilateral nodular airspace opacities throughout the upper and lower lobes bilaterally. There is no pleural effusion or pneumothorax. The heart and mediastinal contours are unremarkable. The osseous structures are unremarkable. IMPRESSION: New bilateral nodular airspace opacities throughout the upper and lower lobes bilaterally. Findings are concerning for multifocal infection such as septic emboli. Recommend further evaluation with a CT of the chest with intravenous contrast. Electronically Signed   By: Elige Ko   On: 11/10/2016 13:23   Ct Chest Wo Contrast  Result Date: 11/20/2016 CLINICAL DATA:  Follow-up right pleural effusion EXAM:  CT CHEST WITHOUT CONTRAST TECHNIQUE: Multidetector CT imaging of the chest was performed following the standard protocol without IV contrast. COMPARISON:  Radiograph 11/20/2016, CT chest 11/10/2016, prior radiographs dating back to 11/10/2016 FINDINGS: Cardiovascular: Limited assessment without intravenous contrast. Small fluid in the superior pericardial recess. There is cardiomegaly. Mediastinum/Nodes: Difficult evaluation without contrast. Mediastinal adenopathy is present right paratracheal soft tissue fullness, possible adenopathy does not appear significantly changed. Midline trachea. Esophagus grossly unremarkable. Lungs/Pleura: Interim placement of a right lower lateral chest tube since the prior CT with the tip terminating along the right upper posterior pleural surface. Small bilateral pleural effusions right greater than left, slightly increased compared to the previous CT. Tiny right anterior hydropneumothorax. Multiple bilateral pulmonary nodules and cavitary lesions. Some of the cavitary lesions have decreased, however if there appears to be increased nodules/disease most notable in the left upper lobe and the right lower lobe. Decreased septal thickening compared to the previous exam. Upper Abdomen: Spleen appears enlarged. Partially visualized surgical clips in the gallbladder fossa. Musculoskeletal: No acute or suspicious bone lesion. IMPRESSION: 1. Interim placement of right-sided chest tube. Small residual pleural effusions right greater than left, mildly increased compared to the prior chest CT. Small right anterior hydropneumothorax. 2. Innumerable bilateral lung nodules and cavitary lesions, suspected to represent septic emboli. This appears increased in the left upper and right lower lobe since the prior CT. 3. Suspect mediastinal adenopathy although evaluation of the mediastinum is limited without contrast 4. Splenomegaly Electronically Signed   By: Jasmine Pang M.D.   On: 11/20/2016 21:25     Ct Chest Wo Contrast  Result Date: 11/10/2016 CLINICAL DATA:  She c/o some chest discomfrot and shortness of  breath x 1 week. She states these are similar s/s she had a year ago, at which time she was dx with "pulmonary abscess". She is in no distress. IV drug user EXAM: CT CHEST WITHOUT CONTRAST TECHNIQUE: Multidetector CT imaging of the chest was performed following the standard protocol without IV contrast. COMPARISON:  Chest CT angiogram dated 03/12/2016. FINDINGS: Cardiovascular: Probable small pericardial effusion and/or pericardial thickening, difficult to characterize without IV contrast. Heart size is within normal limits. Thoracic aorta appears to be normal in caliber. Mediastinum/Nodes: Soft tissue density material within the right upper peritracheal space, also difficult to characterize without IV contrast, presumed lymphadenopathy. Additional mild lymphadenopathy within the anterior mediastinum and aortopulmonary window region. Lungs/Pleura: Numerous cavitary consolidations throughout both lungs, peripherally distributed suggesting septic emboli. More confluent consolidations at each lung base, more likely atelectasis than pneumonia. Small right pleural effusion. Upper Abdomen: Suspected splenomegaly, incompletely imaged. Musculoskeletal: No acute or suspicious osseous finding. IMPRESSION: 1. Numerous cavitary consolidations throughout both lungs, with a peripheral distribution suggesting septic emboli. Largest cavitary consolidation is within the left lower lobe measuring approximately 4 cm greatest dimension. 2. Probable small pericardial effusion and/or pericardial wall thickening, difficult to delineate without IV contrast. 3. Probable mediastinal lymphadenopathy, again difficult to definitively characterize without IV contrast, alternatively confluent fluid or edema within the mediastinum. 4. Small right pleural effusion. 5. Probable splenomegaly, incompletely imaged at the lower aspects of  this chest CT. 6. Right IJ line in place with tip passing through the right atrium and into the right ventricle. Recommend retracting to the cavoatrial junction for optimal radiographic positioning. These results and recommendations were called by telephone at the time of interpretation on 11/10/2016 at 7:35 pm to Dr. Katrinka Blazing , who verbally acknowledged these results. Electronically Signed   By: Bary Richard M.D.   On: 11/10/2016 19:36   Ct Abdomen Pelvis W Contrast  Result Date: 11/14/2016 CLINICAL DATA:  Right upper quadrant pain times 2-3 days. Polysubstance abuse. Cholecystectomy. History of endocarditis and septic emboli. EXAM: CT ABDOMEN AND PELVIS WITH CONTRAST TECHNIQUE: Multidetector CT imaging of the abdomen and pelvis was performed using the standard protocol following bolus administration of intravenous contrast. CONTRAST:  80mL ISOVUE-300 IOPAMIDOL (ISOVUE-300) INJECTION 61% COMPARISON:  03/12/2016 chest CT FINDINGS: Lower chest: Small right effusion with several bilateral cavitary opacities in both lower lobes concerning for septic emboli or cavitary pneumonia. More confluent airspace opacity in the left lower lobe is also noted. The visualized heart is normal in size. There is no pericardial effusion. No large central pulmonary embolus. Hepatobiliary: Normal appearance of the liver without space-occupying mass or biliary dilatation. Cholecystectomy. Pancreas: No pancreatic mass or ductal dilatation. Mild enlargement of the pancreatic head with surrounding peripancreatic fluid is noted. Correlate to exclude a pancreatitis. Spleen: The spleen is enlarged measuring 15.7 x 5.6 x 17 cm (volume = 780 cm^3) and demonstrates a developmental cleft, partially visualized on prior chest CT and therefore believed less likely to represent a laceration. Adrenals/Urinary Tract: Adrenal glands are unremarkable. Kidneys are normal, without renal calculi, solid appearing lesions, or hydronephrosis. There appear to be  pair of water attenuating cysts in the lower pole the right kidney measuring up to 2 x 1.3 cm in aggregate. Bladder is unremarkable. Stomach/Bowel: Stomach is within normal limits. Appendix appears normal. No evidence of bowel wall thickening, distention, or inflammatory changes. Vascular/Lymphatic: No significant vascular findings are present. No enlarged abdominal or pelvic lymph nodes. Reproductive: Uterus and bilateral adnexa are unremarkable. Other: Moderate volume of ascites.  Anasarca. Musculoskeletal: No acute or significant osseous findings. IMPRESSION: 1. Bibasilar, multifocal cavitary lesions noted within the visualized lower lobes consistent with septic emboli or possibly cavitary pneumonia. More confluent airspace disease in the left lower lobe without cavitation is also present. There is a small right pleural effusion. 2. Anasarca with moderate volume of ascites noted within the abdomen and pelvis. Question right heart dysfunction/failure. 3. Splenomegaly. 4. Water attenuating cysts in the lower pole the right kidney in aggregate measuring 2 x 1.3 cm. 5. Status post cholecystectomy. Electronically Signed   By: Tollie Eth M.D.   On: 11/14/2016 16:26   Dg Chest 1v Repeat Same Day  Result Date: 11/18/2016 CLINICAL DATA:  Chest tube placement EXAM: CHEST - 1 VIEW SAME DAY COMPARISON:  Earlier same day FINDINGS: Large bore chest tube placed on the right along the lateral margin. Marked reduction an amount of right pleural air, nearly completely evacuated. Patchy infiltrates persist in both lower lobes. IMPRESSION: Large bore chest tube placed. Near complete resolution of right pneumothorax. Electronically Signed   By: Paulina Fusi M.D.   On: 11/18/2016 11:40   Dg Chest Port 1 View  Result Date: 11/20/2016 CLINICAL DATA:  26 year old female with pneumothorax. Recent transesophageal echocardiogram. Subsequent encounter. EXAM: PORTABLE CHEST 1 VIEW COMPARISON:  11/20/2016 8:30 a.m. FINDINGS: Right-sided  chest tube remains in place. No pneumothorax detected on this frontal projection. Cavitary lesions bilaterally. Left base consolidation. Atelectasis/pleural thickening right lung base. Cardiomegaly. IMPRESSION: No significant change since exam earlier today. Electronically Signed   By: Lacy Duverney M.D.   On: 11/20/2016 13:55   Dg Chest Port 1 View  Result Date: 11/20/2016 CLINICAL DATA:  Chest tube, shortness of Breath EXAM: PORTABLE CHEST 1 VIEW COMPARISON:  11/19/2016 FINDINGS: Right chest tube remains in place, unchanged. No pneumothorax. Patchy bilateral airspace opacities are again noted, some with cavitation. Small right pleural effusion. Heart is borderline in size. IMPRESSION: Right chest tube remains in place without pneumothorax. Stable patchy bilateral airspace disease, some of which is cavitary. Electronically Signed   By: Charlett Nose M.D.   On: 11/20/2016 08:42   Dg Chest Port 1 View  Result Date: 11/19/2016 CLINICAL DATA:  26 year old female with cavitary lung lesions possibly from septic endocarditis. Subsequent encounter. EXAM: PORTABLE CHEST 1 VIEW COMPARISON:  11/18/2016. FINDINGS: Right-sided chest tube remains in place. Tiny hydropneumothorax versus fluid within minor fissure noted. Multiple cavitary lesions. Right-sided pleural effusion. Basilar atelectasis versus infiltrate greater on the left. Cardiomegaly. Mild scoliosis. IMPRESSION: Right-sided chest tube remains in place. Tiny hydropneumothorax versus fluid within minor fissure. Multiple cavitary lesions. Right-sided pleural effusion. Basilar atelectasis versus infiltrate greater on the left. Electronically Signed   By: Lacy Duverney M.D.   On: 11/19/2016 07:16   Dg Chest Port 1 View  Result Date: 11/18/2016 CLINICAL DATA:  Followup left pneumothorax. Patient with bacterial endocarditis. EXAM: PORTABLE CHEST 1 VIEW COMPARISON:  11/17/2016 and prior studies FINDINGS: A moderate right hydropneumothorax is unchanged in size, but  now with pleural fluid in its basilar portion. A right thoracostomy tube is unchanged. Scattered pulmonary opacities/ nodules and left lower lung consolidations/atelectasis again noted. There has been no other interval change. IMPRESSION: Moderate right hydropneumothorax, unchanged in size but now with pleural fluid in its basilar portion. Right thoracostomy tube remains unchanged. Electronically Signed   By: Harmon Pier M.D.   On: 11/18/2016 10:14   Dg Chest Port 1 View  Result Date: 11/17/2016 CLINICAL DATA:  Follow-up right-sided chest tube placement. Initial  encounter. EXAM: PORTABLE CHEST 1 VIEW COMPARISON:  Chest radiograph performed 11/16/2016 FINDINGS: The patient's moderate right-sided pneumothorax has decreased in size. The right-sided chest tube is grossly unchanged in appearance. Vascular congestion is noted. Patchy left-sided airspace opacities raise concern for pneumonia. Right-sided airspace opacity may reflect atelectasis. A small left pleural effusion is noted. The cardiomediastinal silhouette is mildly enlarged. No acute osseous abnormalities are identified. IMPRESSION: 1. Moderate right-sided pneumothorax has decreased in size. Right-sided chest tube is grossly unchanged in appearance. 2. Vascular congestion and mild cardiomegaly. Patchy left-sided airspace opacities raise concern for pneumonia. Small left pleural effusion noted. 3. Right-sided airspace opacity may reflect atelectasis. Electronically Signed   By: Roanna Raider M.D.   On: 11/17/2016 00:33   Dg Chest Port 1 View  Result Date: 11/16/2016 CLINICAL DATA:  Chest tube placement for right pneumothorax. EXAM: PORTABLE CHEST 1 VIEW COMPARISON:  Single-view of the chest earlier today and 11/11/2016. FINDINGS: Pigtail catheter is now in place in the right chest. Large right pneumothorax is unchanged. Patchy airspace disease in cavitary lesions in the left chest persist. Heart size is normal. IMPRESSION: No change in large right  pneumothorax after chest tube placement. No change in patchy airspace disease in cavitary lesions on the left. These results were called by telephone at the time of interpretation on 11/16/2016 at 8:10 pm to Estrella Deeds, RN, who verbally acknowledged these results. Electronically Signed   By: Drusilla Kanner M.D.   On: 11/16/2016 20:12   Dg Chest Port 1 View  Result Date: 11/16/2016 CLINICAL DATA:  Shortness of Breath EXAM: PORTABLE CHEST 1 VIEW COMPARISON:  11/11/2016 FINDINGS: Cardiac shadow is within normal limits. Cavitary nodular densities are noted throughout both lungs similar to that seen on previous CT examination. A new right-sided moderately large pneumothorax is noted with consolidation of the lower lobe on the right. No acute bony abnormality is noted. IMPRESSION: Moderately large right-sided pneumothorax new from the prior exam. Near complete collapse of the right lower lobe is noted. Stable cavitary lesions within both lungs. Critical Value/emergent results were called by telephone at the time of interpretation on 11/16/2016 at 6:24 pm to Dr Leitha Bleak, who verbally acknowledged these results. Electronically Signed   By: Alcide Clever M.D.   On: 11/16/2016 18:27   Dg Chest Port 1 View  Result Date: 11/11/2016 CLINICAL DATA:  Check right IJ line placement.  Initial encounter. EXAM: PORTABLE CHEST 1 VIEW COMPARISON:  Chest radiograph performed 11/10/2016 FINDINGS: The right IJ line is noted ending at the right atrium. This could be retracted 2-3 cm. Mild vascular congestion is noted. Mild left-sided opacities may reflect atelectasis or mild pneumonia. No pleural effusion or pneumothorax is seen. The cardiomediastinal silhouette is borderline normal in size. No acute osseous abnormalities are identified. IMPRESSION: 1. Right IJ line noted ending at the right atrium. This could be retracted 2-3 cm, as deemed clinically appropriate. 2. Mild vascular congestion noted. 3. Mild left-sided airspace opacities  may reflect atelectasis or mild pneumonia. Electronically Signed   By: Roanna Raider M.D.   On: 11/11/2016 00:37   Dg Chest Port 1 View  Result Date: 11/10/2016 CLINICAL DATA:  Retraction of the IJ line about 10 cm EXAM: PORTABLE CHEST 1 VIEW COMPARISON:  CT chest 11/10/2016.  Chest 11/10/2016. FINDINGS: The left central venous catheter tip localizes to the mid/distal right atrial region. If placement at or above the cavoatrial junction is desired, retraction of about 3-4 cm is recommended. No pneumothorax. Shallow inspiration with atelectasis  in the lung bases. Borderline heart size. Patchy airspace infiltrates in both lungs could indicate edema, aspiration, or pneumonia. No blunting of costophrenic angles. IMPRESSION: Central venous catheter tip is in the mid/ low right atrial region. If placement at or above the cavoatrial junction is desired, retraction of about 3-4 cm is recommended. Electronically Signed   By: Burman Nieves M.D.   On: 11/10/2016 21:35   Dg Chest Portable 1 View  Result Date: 11/10/2016 CLINICAL DATA:  Status post central line placement. EXAM: PORTABLE CHEST 1 VIEW COMPARISON:  Chest x-ray from earlier same day. FINDINGS: Interval placement of a right IJ central line with tip passing to the left of midline and likely in the right ventricle or main pulmonary artery. The bilateral small nodular airspace opacities throughout both lungs are unchanged in the short-term interval. Suspect small bilateral pleural effusions. No pneumothorax seen. IMPRESSION: 1. Right IJ central line placement with tip positioned to the left of midline either within the right ventricle or main pulmonary artery. Recommend retracting at least 10 cm. 2. Bilateral small nodular airspace opacities throughout both lungs are unchanged in the short-term interval. As recommended on earlier chest x-ray report, would consider chest CT for further characterization. 3. Suspect small bilateral pleural effusions.  Electronically Signed   By: Bary Richard M.D.   On: 11/10/2016 18:37    Lab Data:  CBC:  Recent Labs Lab 11/18/16 1356 11/19/16 0414 11/20/16 0239 11/21/16 0256 11/22/16 0231  WBC 10.7* 8.7 9.4 8.8 7.4  HGB 7.9* 7.7* 7.1* 8.2* 8.5*  HCT 25.0* 24.8* 22.6* 25.7* 26.3*  MCV 85.9 86.7 85.9 85.4 85.7  PLT 309 335 398 411* 407*   Basic Metabolic Panel:  Recent Labs Lab 11/18/16 0214 11/19/16 0414 11/19/16 1525 11/19/16 1828 11/20/16 0239 11/21/16 0256 11/22/16 0231  NA 138 139 135  --  137 136 136  K 4.1 3.9 5.5* 3.9 4.0 3.7 3.7  CL 109 109 110  --  109 106 105  CO2 21* 23 18*  --  22 22 22   GLUCOSE 97 97 85  --  81 93 88  BUN 14 15 14   --  10 9 9   CREATININE 0.99 1.37* 1.08*  --  0.93 0.95 0.84  CALCIUM 7.5* 7.9* 7.7*  --  7.9* 7.9* 8.0*  MG 1.7 2.2  --   --  1.7 1.7  --   PHOS 4.9* 6.0*  --   --  4.9* 4.8*  --    GFR: Estimated Creatinine Clearance: 92.1 mL/min (by C-G formula based on SCr of 0.84 mg/dL). Liver Function Tests:  Recent Labs Lab 11/16/16 2040 11/21/16 0256  AST  --  13*  ALT  --  8*  ALKPHOS  --  151*  BILITOT  --  0.4  PROT 5.8* 5.6*  ALBUMIN  --  1.5*   No results for input(s): LIPASE, AMYLASE in the last 168 hours. No results for input(s): AMMONIA in the last 168 hours. Coagulation Profile: No results for input(s): INR, PROTIME in the last 168 hours. Cardiac Enzymes:  Recent Labs Lab 11/18/16 0214  CKTOTAL 30*   BNP (last 3 results) No results for input(s): PROBNP in the last 8760 hours. HbA1C: No results for input(s): HGBA1C in the last 72 hours. CBG:  Recent Labs Lab 11/19/16 1223  GLUCAP 83   Lipid Profile: No results for input(s): CHOL, HDL, LDLCALC, TRIG, CHOLHDL, LDLDIRECT in the last 72 hours. Thyroid Function Tests: No results for input(s): TSH, T4TOTAL, FREET4,  T3FREE, THYROIDAB in the last 72 hours. Anemia Panel: No results for input(s): VITAMINB12, FOLATE, FERRITIN, TIBC, IRON, RETICCTPCT in the last 72  hours. Urine analysis:    Component Value Date/Time   COLORURINE YELLOW 11/10/2016 2132   APPEARANCEUR CLEAR 11/10/2016 2132   LABSPEC 1.006 11/10/2016 2132   PHURINE 5.0 11/10/2016 2132   GLUCOSEU NEGATIVE 11/10/2016 2132   HGBUR MODERATE (A) 11/10/2016 2132   BILIRUBINUR NEGATIVE 11/10/2016 2132   KETONESUR NEGATIVE 11/10/2016 2132   PROTEINUR NEGATIVE 11/10/2016 2132   UROBILINOGEN 0.2 08/05/2010 1821   NITRITE NEGATIVE 11/10/2016 2132   LEUKOCYTESUR SMALL (A) 11/10/2016 2132     Anson Peddie M.D. [facility] 11/22/2016, 4:06 PM  Pager: [phone] Between 7am to 7pm - call Pager - [phone]  After 7pm go to [email or web address] - password [email or web address]  Call night coverage person covering after 7pm

## 2016-11-23 ENCOUNTER — Inpatient Hospital Stay (HOSPITAL_COMMUNITY): Payer: BLUE CROSS/BLUE SHIELD

## 2016-11-23 DIAGNOSIS — J939 Pneumothorax, unspecified: Secondary | ICD-10-CM

## 2016-11-23 LAB — CREATININE, SERUM
Creatinine, Ser: 0.78 mg/dL (ref 0.44–1.00)
GFR calc Af Amer: >60 mL/min (ref >60)

## 2016-11-23 MED ORDER — VANCOMYCIN HCL IN DEXTROSE 1-5 GM/200ML-% IV SOLN
1000.0000 mg | Freq: Three times a day (TID) | INTRAVENOUS | Status: DC
  Administered 2016-11-23 – 2016-11-25 (×7): 1000 mg via INTRAVENOUS
  Filled 2016-11-23 (×10): qty 200

## 2016-11-23 MED ORDER — VANCOMYCIN HCL 10 G IV SOLR
1500.0000 mg | Freq: Once | INTRAVENOUS | Status: AC
  Administered 2016-11-23: 1500 mg via INTRAVENOUS
  Filled 2016-11-23: qty 1500

## 2016-11-23 NOTE — Progress Notes (Signed)
[facility]   Reason for visit: Follow up on MRSA TV endocarditis  Interval History: no new complaints; normal WBC yesterday, no fever  Physical Exam: Constitutional:  Vitals:   11/23/16 0800 11/23/16 1034  BP: 114/80 117/61  Pulse: 71 84  Resp: 14 18  Temp: 98.4 F (36.9 C) 97.7 F (36.5 C)   patient appears in NAD   Review of Systems: Constitutional: negative for chills  Lab Results  Component Value Date   WBC 7.4 11/22/2016   HGB 8.5 (L) 11/22/2016   HCT 26.3 (L) 11/22/2016   MCV 85.7 11/22/2016   PLT 407 (H) 11/22/2016    Lab Results  Component Value Date   CREATININE 0.78 11/23/2016   BUN 9 11/22/2016   NA 136 11/22/2016   K 3.7 11/22/2016   CL 105 11/22/2016   CO2 22 11/22/2016    Lab Results  Component Value Date   ALT 8 (L) 11/21/2016   AST 13 (L) 11/21/2016   ALKPHOS 151 (H) 11/21/2016     Microbiology: Recent Results (from the past 240 hour(s))  Culture, blood (Routine X 2) w Reflex to ID Panel     Status: None   Collection Time: 11/14/16 12:33 AM  Result Value Ref Range Status   Specimen Description BLOOD RIGHT ANTECUBITAL  Final   Special Requests   Final    BOTTLES DRAWN AEROBIC ONLY Blood Culture adequate volume   Culture   Final    NO GROWTH 5 DAYS Performed at [facility], [address] [place], [place] [address]    Report Status 11/19/2016 FINAL  Final  Culture, blood (Routine X 2) w Reflex to ID Panel     Status: Abnormal   Collection Time: 11/14/16 12:33 AM  Result Value Ref Range Status   Specimen Description BLOOD LEFT HAND  Final   Special Requests IN PEDIATRIC BOTTLE Blood Culture adequate volume  Final   Culture  Setup Time   Final    GRAM POSITIVE COCCI IN CLUSTERS IN PEDIATRIC BOTTLE CRITICAL VALUE NOTED.  VALUE IS CONSISTENT WITH PREVIOUSLY REPORTED AND CALLED VALUE.    Culture (A)  Final    STAPHYLOCOCCUS AUREUS SUSCEPTIBILITIES PERFORMED ON PREVIOUS CULTURE WITHIN THE LAST 5  DAYS. Performed at [facility], [address], [place], [place] [address]    Report Status 11/16/2016 FINAL  Final  Culture, blood (Routine X 2) w Reflex to ID Panel     Status: Abnormal   Collection Time: 11/14/16  4:10 PM  Result Value Ref Range Status   Specimen Description BLOOD RIGHT ARM  Final   Special Requests IN PEDIATRIC BOTTLE Blood Culture adequate volume  Final   Culture  Setup Time   Final    IN PEDIATRIC BOTTLE GRAM POSITIVE COCCI IN CLUSTERS CRITICAL RESULT CALLED TO, READ BACK BY AND VERIFIED WITH: PHARMD A PHAM [id number] MLM    Culture (A)  Final    STAPHYLOCOCCUS AUREUS SUSCEPTIBILITIES PERFORMED ON PREVIOUS CULTURE WITHIN THE LAST 5 DAYS. Performed at [facility], [address] [place], [place] [address]    Report Status 11/17/2016 FINAL  Final  Culture, blood (Routine X 2) w Reflex to ID Panel     Status: None   Collection Time: 11/14/16  4:10 PM  Result Value Ref Range Status   Specimen Description BLOOD RIGHT ARM  Final   Special Requests   Final    BOTTLES DRAWN AEROBIC ONLY  Blood Culture adequate volume   Culture   Final    NO GROWTH 5 DAYS Performed at [facility], [address], [place], [place] [address]    Report Status 11/19/2016 FINAL  Final  MRSA PCR Screening     Status: None   Collection Time: 11/16/16  5:49 PM  Result Value Ref Range Status   MRSA by PCR NEGATIVE NEGATIVE Final    Comment:        The GeneXpert MRSA Assay (FDA approved for NASAL specimens only), is one component of a comprehensive MRSA colonization surveillance program. It is not intended to diagnose MRSA infection nor to guide or monitor treatment for MRSA infections. DELTA CHECK NOTED   Body fluid culture (includes gram stain)     Status: None   Collection Time: 11/16/16  8:06 PM  Result Value Ref Range Status   Specimen Description PLEURAL FLUID  Final   Special Requests NONE  Final   Gram Stain   Final    FEW WBC PRESENT,  PREDOMINANTLY PMN NO ORGANISMS SEEN    Culture   Final    FEW METHICILLIN RESISTANT STAPHYLOCOCCUS AUREUS RESULT CALLED TO, READ BACK BY AND VERIFIED WITH: R. MYRICK RN, AT 1023 11/18/16 BY D. VANHOOK REGARDING CULTURE GROWTH INTERPRET RESULTS WITH CAUTION DUE TO LIMITED SPECIMEN VOLUME Performed at [facility], [address] [place], [place] [address]    Report Status 11/20/2016 FINAL  Final   Organism ID, Bacteria METHICILLIN RESISTANT STAPHYLOCOCCUS AUREUS  Final      Susceptibility   Methicillin resistant staphylococcus aureus - MIC*    CIPROFLOXACIN >=8 RESISTANT Resistant     ERYTHROMYCIN >=8 RESISTANT Resistant     GENTAMICIN <=0.5 SENSITIVE Sensitive     OXACILLIN >=4 RESISTANT Resistant     TETRACYCLINE <=1 SENSITIVE Sensitive     VANCOMYCIN 1 SENSITIVE Sensitive     TRIMETH/SULFA <=10 SENSITIVE Sensitive     CLINDAMYCIN <=0.25 SENSITIVE Sensitive     RIFAMPIN <=0.5 SENSITIVE Sensitive     Inducible Clindamycin NEGATIVE Sensitive     * FEW METHICILLIN RESISTANT STAPHYLOCOCCUS AUREUS  Culture, blood (routine x 2)     Status: None (Preliminary result)   Collection Time: 11/19/16  8:57 AM  Result Value Ref Range Status   Specimen Description BLOOD RIGHT ARM  Final   Special Requests IN PEDIATRIC BOTTLE Blood Culture adequate volume  Final   Culture NO GROWTH 4 DAYS  Final   Report Status PENDING  Incomplete  Culture, blood (routine x 2)     Status: None (Preliminary result)   Collection Time: 11/19/16  9:05 AM  Result Value Ref Range Status   Specimen Description BLOOD RIGHT ANTECUBITAL  Final   Special Requests IN PEDIATRIC BOTTLE Blood Culture adequate volume  Final   Culture NO GROWTH 4 DAYS  Final   Report Status PENDING  Incomplete    Impression/Plan:  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 Can therefore stop  ceftaroline and daptomycin and use vancomycin alone with good coverage for all her sites of infection.  Will change back to vancomycin now.  Duration through July 15 We will arrange follow in early July She will need twice weekly vancomycin trough, BMP; weekly cbc Trough goal of 15-20 Ok for picc line from and ID standpoint  2. Substance abuse - continue suboxone and efforts  to remain off of IVDs.

## 2016-11-23 NOTE — Progress Notes (Addendum)
[address]       [place] [id number]             [phone]      7 Days Post-Op Procedure(s) (LRB): TRANSESOPHAGEAL ECHOCARDIOGRAM (TEE) (N/A) Subjective: Breathing comfortably with tube out, CXR is pending  Objective: Vital signs in last 24 hours: Temp:  [98 F (36.7 C)-99 F (37.2 C)] 98.7 F (37.1 C) (06/08 0440) Pulse Rate:  [73-88] 83 (06/08 0440) Cardiac Rhythm: Normal sinus rhythm (06/07 1931) Resp:  [17-18] 18 (06/08 0440) BP: (104-125)/(67-91) 112/67 (06/08 0440) SpO2:  [95 %-98 %] 98 % (06/08 0440) Weight:  [133 lb 6.1 oz (60.5 kg)] 133 lb 6.1 oz (60.5 kg) (06/08 0400)  Hemodynamic parameters for last 24 hours:    Intake/Output from previous day: 06/07 0701 - 06/08 0700 In: 1665 [P.O.:480; I.V.:325; IV Piggyback:860] Out: 0  Intake/Output this shift: No intake/output data recorded.  General appearance: alert, cooperative and no distress Heart: regular rate and rhythm Lungs: clear to auscultation bilaterally Abdomen: benign  Lab Results:  Recent Labs  11/21/16 0256 11/22/16 0231  WBC 8.8 7.4  HGB 8.2* 8.5*  HCT 25.7* 26.3*  PLT 411* 407*   BMET:  Recent Labs  11/21/16 0256 11/22/16 0231 11/23/16 0254  NA 136 136  --   K 3.7 3.7  --   CL 106 105  --   CO2 22 22  --   GLUCOSE 93 88  --   BUN 9 9  --   CREATININE 0.95 0.84 0.78  CALCIUM 7.9* 8.0*  --     PT/INR: No results for input(s): LABPROT, INR in the last 72 hours. ABG    Component Value Date/Time   PHART 7.442 11/11/2016 0415   HCO3 21.6 11/11/2016 0415   TCO2 31 03/12/2016 1756   ACIDBASEDEF 1.7 11/11/2016 0415   O2SAT 98.9 11/11/2016 0415   CBG (last 3)  No results for input(s): GLUCAP in the last 72 hours.  Meds Scheduled Meds: . acetaminophen  1,000 mg Oral Q6H  . buprenorphine-naloxone  2 tablet Sublingual Daily  . busPIRone  10 mg Oral BID  . citalopram  10 mg Oral Daily  . gabapentin  300 mg Oral TID  . heparin  5,000 Units Subcutaneous  Q8H  . nicotine  14 mg Transdermal Daily  . saccharomyces boulardii  250 mg Oral BID   Continuous Infusions: . sodium chloride 250 mL (11/19/16 0700)  . ceFTAROline (TEFLARO) IV Stopped (11/23/16 0509)  . DAPTOmycin (CUBICIN)  IV Stopped (11/22/16 1630)   PRN Meds:.sodium chloride, albuterol, ALPRAZolam, docusate, nicotine polacrilex, ondansetron (ZOFRAN) IV, oxyCODONE  Xrays Dg Chest 2 View  Result Date: 11/21/2016 CLINICAL DATA:  Chest soreness.  Chest tube . EXAM: CHEST  2 VIEW COMPARISON:  CT 11/20/2016.  Chest x-ray 11/20/2016 FINDINGS: Chest tube noted on the right. Tiny right anterior hydropneumothorax again noted. No interim change . Stable cardiomegaly. Persistent bilateral pulmonary nodules including cavitary nodules. Reference is made to prior CT report. Small right pleural effusion. No pneumothorax. IMPRESSION: 1. Right chest tube in stable position. Tiny right anterior hydropneumothorax again noted. No interim change. 2. Multiple bilateral pulmonary nodules with cavitation. Reference is made to prior CT report of 11/20/2016 . Electronically Signed   By: Maisie Fushomas  Register   On: 11/21/2016 11:15    Assessment/Plan: S/P Procedure(s) (LRB): TRANSESOPHAGEAL ECHOCARDIOGRAM (TEE) (N/A)  1 clinically stable- CXR is pending. Tmax 99, hemodyn stable. No CBC done today but no  leukocytosis yesterday 2 cont medical management per IM, Pulmonology and ID  LOS: 13 days    GOLD,WAYNE E 11/23/2016  I have seen and examined the patient and agree with the assessment and plan as outlined.   CXR looks good.  Plan per medical teams.  Purcell Nails, MD 11/23/2016 11:49 AM

## 2016-11-23 NOTE — NC FL2 (Signed)
MEDICAID FL2 LEVEL OF CARE SCREENING TOOL     IDENTIFICATION  Patient Name: ROSHELL Palmer Birthdate: [birth year] Sex: female Admission Date (Current Location): 11/10/2016  [facility] and [place] Number:  Producer, television/film/video and Address:  [facility], [address] [place], [place] [address]      Provider Number: [id number]  Attending Physician Name and Address:  Cathren Harsh, MD  Relative Name and Phone Number:       Current Level of Care: Hospital Recommended Level of Care: Skilled Nursing Facility Prior Approval Number:    Date Approved/Denied:   PASRR Number: [id number]  Discharge Plan: SNF    Current Diagnoses: Patient Active Problem List   Diagnosis Date Noted  . Pleural effusion on right   . Pneumothorax on right 11/17/2016  . Bacterial endocarditis   . Tricuspid valve regurgitation, infectious   . RUQ pain   . Right hip pain   . Hypokalemia 11/12/2016  . Hypophosphatemia 11/12/2016  . Endocarditis 11/12/2016  . Thrombocytopenia (HCC) 11/12/2016  . Encounter for central line placement   . Respiratory failure (HCC)   . MRSA bacteremia   . Septic shock (HCC)   . Cavitary pneumonia   . Septic pulmonary embolism (HCC) 03/12/2016  . Normocytic anemia 03/12/2016  . Hepatitis C antibody test positive 01/17/2016  . AKI (acute kidney injury) (HCC) 01/13/2016  . IVDU (intravenous drug user) 01/13/2016  . Polysubstance (including opioids) dependence, daily use (HCC) 01/13/2016  . Murmur, heart 01/13/2016  . Altered mental status     Orientation RESPIRATION BLADDER Height & Weight     Self, Time, Situation, Place  Normal Continent Weight: 59 kg (130 lb) Height:  5\' 3"  (160 cm)  BEHAVIORAL SYMPTOMS/MOOD NEUROLOGICAL BOWEL NUTRITION STATUS      Continent Diet (Please see DC Summary)  AMBULATORY STATUS COMMUNICATION OF NEEDS Skin   Independent Verbally Normal                       Personal Care Assistance  Level of Assistance  Bathing, Feeding, Dressing Bathing Assistance: Independent Feeding assistance: Independent Dressing Assistance: Independent     Functional Limitations Info             SPECIAL CARE FACTORS FREQUENCY                       Contractures      Additional Factors Info  Code Status, Allergies Code Status Info: Full Allergies Info: NKA           Current Medications (11/23/2016):  This is the current hospital active medication list Current Facility-Administered Medications  Medication Dose Route Frequency Provider Last Rate Last Dose  . 0.9 %  sodium chloride infusion  250 mL Intravenous PRN Wilber Oliphant, MD 10 mL/hr at 11/19/16 0700 250 mL at 11/19/16 0700  . acetaminophen (TYLENOL) tablet 1,000 mg  1,000 mg Oral Q6H Lo Mordecai Rasmussen, MD   1,000 mg at 11/23/16 1533  . albuterol (PROVENTIL) (2.5 MG/3ML) 0.083% nebulizer solution 2.5 mg  2.5 mg Nebulization Q2H PRN Wilber Oliphant, MD      . ALPRAZolam Prudy Feeler) tablet 0.5 mg  0.5 mg Oral TID PRN Marinda Elk, MD   0.5 mg at 11/23/16 0825  . buprenorphine-naloxone (SUBOXONE) 8-2 mg per SL tablet 2 tablet  2 tablet Sublingual Daily Wilber Oliphant, MD   2 tablet at 11/23/16 0949  . busPIRone (BUSPAR) tablet 10  mg  10 mg Oral BID Wilber OliphantPanchal, Amar, MD   10 mg at 11/23/16 0950  . citalopram (CELEXA) tablet 10 mg  10 mg Oral Daily Wilber OliphantPanchal, Amar, MD   10 mg at 11/23/16 0949  . docusate (COLACE) 50 MG/5ML liquid 100 mg  100 mg Per Tube BID PRN Wilber OliphantPanchal, Amar, MD      . gabapentin (NEURONTIN) capsule 300 mg  300 mg Oral TID Wilber OliphantPanchal, Amar, MD   300 mg at 11/23/16 1533  . heparin injection 5,000 Units  5,000 Units Subcutaneous Q8H Panchal, Azucena CecilAmar, MD   5,000 Units at 11/23/16 1533  . nicotine (NICODERM CQ - dosed in mg/24 hours) patch 14 mg  14 mg Transdermal Daily Marinda ElkFeliz Ortiz, Abraham, MD   14 mg at 11/23/16 [id number]  . nicotine polacrilex (NICORETTE) gum 2 mg  2 mg Oral PRN Schorr, Roma KayserKatherine P, NP   2 mg at 11/13/16 2316  .  ondansetron (ZOFRAN) injection 4 mg  4 mg Intravenous Q6H PRN Wilber OliphantPanchal, Amar, MD      . oxyCODONE (Oxy IR/ROXICODONE) immediate release tablet 5-10 mg  5-10 mg Oral Q4H PRN Marton RedwoodLo Mordecai RasmussenVerde, Daniel, MD   10 mg at 11/20/16 2030  . saccharomyces boulardii (FLORASTOR) capsule 250 mg  250 mg Oral BID Osvaldo ShipperKrishnan, Gokul, MD   250 mg at 11/23/16 0949  . vancomycin (VANCOCIN) 1,500 mg in sodium chloride 0.9 % 500 mL IVPB  1,500 mg Intravenous Once Ancil BoozerStone, Taylor P, RPH 250 mL/hr at 11/23/16 1533 1,500 mg at 11/23/16 1533   Followed by  . [START ON 11/24/2016] vancomycin (VANCOCIN) IVPB 1000 mg/200 mL premix  1,000 mg Intravenous Q8H Ancil BoozerStone, Taylor P, [facility]RPH         Discharge Medications: Please see discharge summary for a list of discharge medications.  Relevant Imaging Results:  Relevant Lab Results:   Additional Information SSN: [id number]  Mearl LatinNadia S Philis Doke, LCSWA

## 2016-11-23 NOTE — Progress Notes (Signed)
[facility]                                                                              Patient Demographics  Savannah Palmer, is a 26 y.o. female, DOB - 1990-10-20, ZOX:[id number]  Admit date - 11/10/2016   Admitting Physician Coralyn Helling, MD  Outpatient Primary MD for the patient is Patient, No Pcp Per  Outpatient specialists:   LOS - 13  days   Medical records reviewed and are as summarized below:    No chief complaint on file.      Brief summary    26 year old Caucasian female with a past medical history of anxiety, depression, heroin abuse, presented with weakness, cough and chest pain. Initially was in septic shock and required pressors. Blood cultures grew MRSA. 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 She was placed on IV antibiotics. She also developed a spontaneous right-sided pneumothorax. Chest tube was placed by critical care medicine. She was transferred to [facility] for opinion from cardiothoracic surgery. Patient is not a candidate for surgery at this time.   Assessment & Plan    Principal Problem: MRSA bacteremia/Septic shock with right TV endocarditis and septic emboli to lungs/MRSA empyema - Patient presented with weakness, coughing and chest pain and septic shock requiring vasopressors. Blood cultures 5/27 grew out MRSA and was found to have septic emboli throughout her lungs.  -Repeat cultures were still positive, blood cultures repeat on 6/4 has been negative to date  - 2-D echo on 5/27 showed tricuspid vegetations with moderate TR. TEE on 6/1 showed large tricuspid vegetation, severe TR with dilated RV along with a loculated left effusion -HIV, Hep C negative. Due to family request, RPR was done and it was negative. - Due to persistent bacteremia patient was started on daptomycin 6/2 and Ceftaroline continued to cover lung infection. - cardiothoracic  surgery was also consulted, not a candidate for surgery at this time.  - Per ID recommendations today, DC Ceftaroline and daptomycin, use vancomycin alone, duration through July 15, will need BMP, CBC weekly, trough goal of 15-20. Place PICC line today  Active problems: Right spontaneous pneumothorax- acute respiratory failure/MRSA Empyema -Right empyema with pneumothorax, positive for MRSA. Status post chest tube. - Chest tube removed on 6/7, chest x-ray clear   Acute kidney injury -Baseline creatinine 0.7, creatinine increased to 1.37, likely secondary to dehydration and NSAIDs. - patient received IV fluids, NSAIDs were discontinued, and creatinine function has normalized.   RUQ pain, likely musculoskeletal -CT abdomen and pelvis showed no liver or gallbladder abnormalities, possible musculoskeletal origin, currently stable.    Ascites, anasarca due to right sided hear failure - as a result of severe TR, net positive balance of 13.7 L - limit IVF, hold off on administering diuretics in setting of sepsis and borderline BPs, O2 sats 96% on room air  Splenomegaly - etiology undetermined- CT on 5/30 revealed that the spleen is enlarged measuring 15.7 x 5.6 x 17 cm (volume= 780 cm^3) - liver is unremarkable on CT  Normocytic Anemia - H&H currently stable, was  transfused on 6/5, no overt bleeding noted - Iron was 12. Ferritin 213. B-12 1850. Folate 7.8.  Hypokalemia/Hypophosphatemia/Hypomagnesemia - Resolved  Thrombocytopenia - Resolved  Heroin abuse - cont Suboxone- will need transition to a drug rehab program once stable.   Anxiety - cont Buspar, Neurontin, Xanax  Nicotine dependence -Continue  Nicoderm patch   Code Status: Full CODE STATUS  DVT Prophylaxis:  heparin  Family Communication: Discussed in detail with the patient, all imaging results, lab results explained to the patient and patient's mother in detail at the bedside   Disposition Plan: Transfer to  telemetry floor, will need skilled nursing facility  Time Spent in minutes  25 minutes  Procedures:  TEE  - Left ventricle: The cavity size was normal. Wall thickness was normal. Systolic function was normal. The estimated ejection fraction was in the range of 55% to 60%. Wall motion was normal; there were no regional wall motion abnormalities. - Left atrium: No evidence of thrombus in the atrial cavity or appendage. - Right ventricle: The cavity size was mildly dilated. Wall thickness was normal. - Right atrium: No evidence of thrombus in the atrial cavity or appendage. - Tricuspid valve: Flail motion of the posterior leaflet. There is a 8x5 mm vegetation attached to the posterior leaflet, but most of the &quot;mass&quot; described on transthoracic imaging appears to be the flail segment of the valve. There was severe regurgitation directed eccentrically and toward the free wall.  Right chest tube placement for pneumothorax 6/1  Placement of a larger bore chest tube in the right chest 6/3  Consultants:   Infectious disease Cardiothoracic surgery  Antimicrobials:   Daptomycin 6/2> 6/8  Teflaro 5/31> 6/8  Vancomycin 6/8>> will need to July 15   Medications  Scheduled Meds: . acetaminophen  1,000 mg Oral Q6H  . buprenorphine-naloxone  2 tablet Sublingual Daily  . busPIRone  10 mg Oral BID  . citalopram  10 mg Oral Daily  . gabapentin  300 mg Oral TID  . heparin  5,000 Units Subcutaneous Q8H  . nicotine  14 mg Transdermal Daily  . saccharomyces boulardii  250 mg Oral BID   Continuous Infusions: . sodium chloride 250 mL (11/19/16 0700)  . vancomycin     Followed by  . [START ON 11/24/2016] vancomycin     PRN Meds:.sodium chloride, albuterol, ALPRAZolam, docusate, nicotine polacrilex, ondansetron (ZOFRAN) IV, oxyCODONE   Antibiotics   Anti-infectives    Start     Dose/Rate Route Frequency Ordered Stop   11/24/16 0000  vancomycin  (VANCOCIN) IVPB 1000 mg/200 mL premix     1,000 mg 200 mL/hr over 60 Minutes Intravenous Every 8 hours 11/23/16 1238     11/23/16 1600  vancomycin (VANCOCIN) 1,500 mg in sodium chloride 0.9 % 500 mL IVPB     1,500 mg 250 mL/hr over 120 Minutes Intravenous  Once 11/23/16 1238     11/17/16 1600  DAPTOmycin (CUBICIN) 500 mg in sodium chloride 0.9 % IVPB  Status:  Discontinued     500 mg 220 mL/hr over 30 Minutes Intravenous Every 24 hours 11/17/16 1432 11/23/16 1238   11/17/16 1500  ceftaroline (TEFLARO) 600 mg in sodium chloride 0.9 % 250 mL IVPB  Status:  Discontinued     600 mg 250 mL/hr over 60 Minutes Intravenous Every 12 hours 11/17/16 1435 11/23/16 1238   11/15/16 1800  ceftaroline (TEFLARO) 600 mg in sodium chloride 0.9 % 250 mL IVPB  Status:  Discontinued     600 mg 250  mL/hr over 60 Minutes Intravenous Every 12 hours 11/15/16 1635 11/17/16 1431   11/13/16 1215  vancomycin (VANCOCIN) IVPB 1000 mg/200 mL premix  Status:  Discontinued     1,000 mg 200 mL/hr over 60 Minutes Intravenous Every 8 hours 11/13/16 1203 11/16/16 0850   11/11/16 1100  vancomycin (VANCOCIN) IVPB 750 mg/150 ml premix  Status:  Discontinued     750 mg 150 mL/hr over 60 Minutes Intravenous Every 8 hours 11/11/16 0956 11/13/16 1203   11/11/16 0400  vancomycin (VANCOCIN) 500 mg in sodium chloride 0.9 % 100 mL IVPB  Status:  Discontinued     500 mg 100 mL/hr over 60 Minutes Intravenous Every 12 hours 11/10/16 1951 11/11/16 0956   11/10/16 2200  ceFEPIme (MAXIPIME) 1 g in dextrose 5 % 50 mL IVPB  Status:  Discontinued     1 g 100 mL/hr over 30 Minutes Intravenous Every 12 hours 11/10/16 1951 11/11/16 0926   11/10/16 2000  ceFEPIme (MAXIPIME) 2 g in dextrose 5 % 50 mL IVPB  Status:  Discontinued     2 g 100 mL/hr over 30 Minutes Intravenous  Once 11/10/16 1952 11/10/16 1954   11/10/16 2000  vancomycin (VANCOCIN) IVPB 1000 mg/200 mL premix  Status:  Discontinued     1,000 mg 200 mL/hr over 60 Minutes Intravenous   Once 11/10/16 1952 11/10/16 1954   11/10/16 1430  piperacillin-tazobactam (ZOSYN) IVPB 3.375 g     3.375 g 100 mL/hr over 30 Minutes Intravenous  Once 11/10/16 1426 11/10/16 1503   11/10/16 1430  vancomycin (VANCOCIN) IVPB 1000 mg/200 mL premix     1,000 mg 200 mL/hr over 60 Minutes Intravenous  Once 11/10/16 1426 11/10/16 1653        Subjective:   Adria Devon was seen and examined today.No fevers or chills, denies any specific complaints. Patient denies dizziness,  shortness of breath, abdominal pain, N/V/D/C, new weakness, numbess, tingling. No acute events overnight.    Objective:   Vitals:   11/23/16 0440 11/23/16 0800 11/23/16 1034 11/23/16 1428  BP: 112/67 114/80 117/61 118/76  Pulse: 83 71 84 84  Resp: 18 14 18 18   Temp: 98.7 F (37.1 C) 98.4 F (36.9 C) 97.7 F (36.5 C) 98.6 F (37 C)  TempSrc: Oral Oral Oral   SpO2: 98% 96% 100% 96%  Weight:   59 kg (130 lb)   Height:   5\' 3"  (1.6 m)     Intake/Output Summary (Last 24 hours) at 11/23/16 1445 Last data filed at 11/23/16 1425  Gross per 24 hour  Intake             1425 ml  Output                0 ml  Net             1425 ml     Wt Readings from Last 3 Encounters:  11/23/16 59 kg (130 lb)  03/14/16 58.5 kg (129 lb)  01/19/16 58.1 kg (128 lb)     Exam  General: Alert and oriented x 3, NAD  Eyes: PERRLA, EOMI  HEENT:  Atraumatic, normocephalic, normal oropharynx  Cardiovascular: S1 S2 clear, systolic murmur in tricuspid area, min pedal edema   Respiratory: CTAB  Gastrointestinal: SoftNT, ND, NBS  Ext: mild  pedal edema bilaterally  Neuro: no focal neurological deficits  Musculoskeletal: No digital cyanosis, clubbing  Skin: No rashes  Psych: alert and oriented 3, normal affect   Data Reviewed:  I have personally reviewed following labs and imaging studies  Micro Results Recent Results (from the past 240 hour(s))  Culture, blood (Routine X 2) w Reflex to ID Panel     Status: None    Collection Time: 11/14/16 12:33 AM  Result Value Ref Range Status   Specimen Description BLOOD RIGHT ANTECUBITAL  Final   Special Requests   Final    BOTTLES DRAWN AEROBIC ONLY Blood Culture adequate volume   Culture   Final    NO GROWTH 5 DAYS Performed at [facility], [address] [place], [place] [address]    Report Status 11/19/2016 FINAL  Final  Culture, blood (Routine X 2) w Reflex to ID Panel     Status: Abnormal   Collection Time: 11/14/16 12:33 AM  Result Value Ref Range Status   Specimen Description BLOOD LEFT HAND  Final   Special Requests IN PEDIATRIC BOTTLE Blood Culture adequate volume  Final   Culture  Setup Time   Final    GRAM POSITIVE COCCI IN CLUSTERS IN PEDIATRIC BOTTLE CRITICAL VALUE NOTED.  VALUE IS CONSISTENT WITH PREVIOUSLY REPORTED AND CALLED VALUE.    Culture (A)  Final    STAPHYLOCOCCUS AUREUS SUSCEPTIBILITIES PERFORMED ON PREVIOUS CULTURE WITHIN THE LAST 5 DAYS. Performed at [facility], [address], [place], [place] [address]    Report Status 11/16/2016 FINAL  Final  Culture, blood (Routine X 2) w Reflex to ID Panel     Status: Abnormal   Collection Time: 11/14/16  4:10 PM  Result Value Ref Range Status   Specimen Description BLOOD RIGHT ARM  Final   Special Requests IN PEDIATRIC BOTTLE Blood Culture adequate volume  Final   Culture  Setup Time   Final    IN PEDIATRIC BOTTLE GRAM POSITIVE COCCI IN CLUSTERS CRITICAL RESULT CALLED TO, READ BACK BY AND VERIFIED WITH: PHARMD A PHAM [id number] 1036AM MLM    Culture (A)  Final    STAPHYLOCOCCUS AUREUS SUSCEPTIBILITIES PERFORMED ON PREVIOUS CULTURE WITHIN THE LAST 5 DAYS. Performed at [facility], [address] [place], [place] [address]    Report Status 11/17/2016 FINAL  Final  Culture, blood (Routine X 2) w Reflex to ID Panel     Status: None   Collection Time: 11/14/16  4:10 PM  Result Value Ref Range Status   Specimen Description BLOOD RIGHT ARM  Final   Special  Requests   Final    BOTTLES DRAWN AEROBIC ONLY Blood Culture adequate volume   Culture   Final    NO GROWTH 5 DAYS Performed at [facility], [address], [place], [place] [address]    Report Status 11/19/2016 FINAL  Final  MRSA PCR Screening     Status: None   Collection Time: 11/16/16  5:49 PM  Result Value Ref Range Status   MRSA by PCR NEGATIVE NEGATIVE Final    Comment:        The GeneXpert MRSA Assay (FDA approved for NASAL specimens only), is one component of a comprehensive MRSA colonization surveillance program. It is not intended to diagnose MRSA infection nor to guide or monitor treatment for MRSA infections. DELTA CHECK NOTED   Body fluid culture (includes gram stain)     Status: None   Collection Time: 11/16/16  8:06 PM  Result Value Ref Range Status   Specimen Description PLEURAL FLUID  Final   Special Requests NONE  Final   Gram Stain  Final    FEW WBC PRESENT, PREDOMINANTLY PMN NO ORGANISMS SEEN    Culture   Final    FEW METHICILLIN RESISTANT STAPHYLOCOCCUS AUREUS RESULT CALLED TO, READ BACK BY AND VERIFIED WITH: R. MYRICK RN, AT 1023 11/18/16 BY D. VANHOOK REGARDING CULTURE GROWTH INTERPRET RESULTS WITH CAUTION DUE TO LIMITED SPECIMEN VOLUME Performed at [facility], [address], [place], [place] [address]    Report Status 11/20/2016 FINAL  Final   Organism ID, Bacteria METHICILLIN RESISTANT STAPHYLOCOCCUS AUREUS  Final      Susceptibility   Methicillin resistant staphylococcus aureus - MIC*    CIPROFLOXACIN >=8 RESISTANT Resistant     ERYTHROMYCIN >=8 RESISTANT Resistant     GENTAMICIN <=0.5 SENSITIVE Sensitive     OXACILLIN >=4 RESISTANT Resistant     TETRACYCLINE <=1 SENSITIVE Sensitive     VANCOMYCIN 1 SENSITIVE Sensitive     TRIMETH/SULFA <=10 SENSITIVE Sensitive     CLINDAMYCIN <=0.25 SENSITIVE Sensitive     RIFAMPIN <=0.5 SENSITIVE Sensitive     Inducible Clindamycin NEGATIVE Sensitive     * FEW METHICILLIN RESISTANT  STAPHYLOCOCCUS AUREUS  Culture, blood (routine x 2)     Status: None (Preliminary result)   Collection Time: 11/19/16  8:57 AM  Result Value Ref Range Status   Specimen Description BLOOD RIGHT ARM  Final   Special Requests IN PEDIATRIC BOTTLE Blood Culture adequate volume  Final   Culture NO GROWTH 4 DAYS  Final   Report Status PENDING  Incomplete  Culture, blood (routine x 2)     Status: None (Preliminary result)   Collection Time: 11/19/16  9:05 AM  Result Value Ref Range Status   Specimen Description BLOOD RIGHT ANTECUBITAL  Final   Special Requests IN PEDIATRIC BOTTLE Blood Culture adequate volume  Final   Culture NO GROWTH 4 DAYS  Final   Report Status PENDING  Incomplete    Radiology Reports Dg Chest 2 View  Result Date: 11/23/2016 CLINICAL DATA:  Chest tube removal EXAM: CHEST  2 VIEW COMPARISON:  11/21/2016 FINDINGS: Interval removal of right chest tube with residual small right pleural effusion versus pleural thickening. No pneumothorax. Patchy bilateral airspace opacities are again noted, unchanged. Heart is borderline in size. IMPRESSION: Interval removal of right chest tube without pneumothorax. Otherwise no change. Electronically Signed   By: Charlett Nose M.D.   On: 11/23/2016 08:44   Dg Chest 2 View  Result Date: 11/21/2016 CLINICAL DATA:  Chest soreness.  Chest tube . EXAM: CHEST  2 VIEW COMPARISON:  CT 11/20/2016.  Chest x-ray 11/20/2016 FINDINGS: Chest tube noted on the right. Tiny right anterior hydropneumothorax again noted. No interim change . Stable cardiomegaly. Persistent bilateral pulmonary nodules including cavitary nodules. Reference is made to prior CT report. Small right pleural effusion. No pneumothorax. IMPRESSION: 1. Right chest tube in stable position. Tiny right anterior hydropneumothorax again noted. No interim change. 2. Multiple bilateral pulmonary nodules with cavitation. Reference is made to prior CT report of 11/20/2016 . Electronically Signed   By: Maisie Fus   Register   On: 11/21/2016 11:15   Dg Chest 2 View  Result Date: 11/19/2016 CLINICAL DATA:  Pneumothorax. EXAM: CHEST  2 VIEW COMPARISON:  11/19/2016. FINDINGS: Right chest tube in stable position. No significant pneumothorax noted on today's exam. Small right pleural effusion. Bilateral patchy pulmonary infiltrates with cavitation again noted. Small left pleural effusion noted. Heart size stable. No acute bony abnormality . IMPRESSION: 1. Right chest tube in stable position. No pneumothorax  noted on today's exam. Small right pleural effusion. 2. Persistent bilateral cavitary pulmonary infiltrates. Small left pleural effusion. Electronically Signed   By: Maisie Fushomas  Register   On: 11/19/2016 16:53   Dg Chest 2 View  Result Date: 11/10/2016 CLINICAL DATA:  Chest pain, shortness of breath for 1 week EXAM: CHEST  2 VIEW COMPARISON:  CT chest 03/12/2016, chest x-ray 03/12/2016 FINDINGS: There are bilateral nodular airspace opacities throughout the upper and lower lobes bilaterally. There is no pleural effusion or pneumothorax. The heart and mediastinal contours are unremarkable. The osseous structures are unremarkable. IMPRESSION: New bilateral nodular airspace opacities throughout the upper and lower lobes bilaterally. Findings are concerning for multifocal infection such as septic emboli. Recommend further evaluation with a CT of the chest with intravenous contrast. Electronically Signed   By: Elige KoHetal  Patel   On: 11/10/2016 13:23   Ct Chest Wo Contrast  Result Date: 11/20/2016 CLINICAL DATA:  Follow-up right pleural effusion EXAM: CT CHEST WITHOUT CONTRAST TECHNIQUE: Multidetector CT imaging of the chest was performed following the standard protocol without IV contrast. COMPARISON:  Radiograph 11/20/2016, CT chest 11/10/2016, prior radiographs dating back to 11/10/2016 FINDINGS: Cardiovascular: Limited assessment without intravenous contrast. Small fluid in the superior pericardial recess. There is cardiomegaly.  Mediastinum/Nodes: Difficult evaluation without contrast. Mediastinal adenopathy is present right paratracheal soft tissue fullness, possible adenopathy does not appear significantly changed. Midline trachea. Esophagus grossly unremarkable. Lungs/Pleura: Interim placement of a right lower lateral chest tube since the prior CT with the tip terminating along the right upper posterior pleural surface. Small bilateral pleural effusions right greater than left, slightly increased compared to the previous CT. Tiny right anterior hydropneumothorax. Multiple bilateral pulmonary nodules and cavitary lesions. Some of the cavitary lesions have decreased, however if there appears to be increased nodules/disease most notable in the left upper lobe and the right lower lobe. Decreased septal thickening compared to the previous exam. Upper Abdomen: Spleen appears enlarged. Partially visualized surgical clips in the gallbladder fossa. Musculoskeletal: No acute or suspicious bone lesion. IMPRESSION: 1. Interim placement of right-sided chest tube. Small residual pleural effusions right greater than left, mildly increased compared to the prior chest CT. Small right anterior hydropneumothorax. 2. Innumerable bilateral lung nodules and cavitary lesions, suspected to represent septic emboli. This appears increased in the left upper and right lower lobe since the prior CT. 3. Suspect mediastinal adenopathy although evaluation of the mediastinum is limited without contrast 4. Splenomegaly Electronically Signed   By: Jasmine PangKim  Fujinaga M.D.   On: 11/20/2016 21:25   Ct Chest Wo Contrast  Result Date: 11/10/2016 CLINICAL DATA:  She c/o some chest discomfrot and shortness of breath x 1 week. She states these are similar s/s she had a year ago, at which time she was dx with "pulmonary abscess". She is in no distress. IV drug user EXAM: CT CHEST WITHOUT CONTRAST TECHNIQUE: Multidetector CT imaging of the chest was performed following the standard  protocol without IV contrast. COMPARISON:  Chest CT angiogram dated 03/12/2016. FINDINGS: Cardiovascular: Probable small pericardial effusion and/or pericardial thickening, difficult to characterize without IV contrast. Heart size is within normal limits. Thoracic aorta appears to be normal in caliber. Mediastinum/Nodes: Soft tissue density material within the right upper peritracheal space, also difficult to characterize without IV contrast, presumed lymphadenopathy. Additional mild lymphadenopathy within the anterior mediastinum and aortopulmonary window region. Lungs/Pleura: Numerous cavitary consolidations throughout both lungs, peripherally distributed suggesting septic emboli. More confluent consolidations at each lung base, more likely atelectasis than pneumonia. Small right  pleural effusion. Upper Abdomen: Suspected splenomegaly, incompletely imaged. Musculoskeletal: No acute or suspicious osseous finding. IMPRESSION: 1. Numerous cavitary consolidations throughout both lungs, with a peripheral distribution suggesting septic emboli. Largest cavitary consolidation is within the left lower lobe measuring approximately 4 cm greatest dimension. 2. Probable small pericardial effusion and/or pericardial wall thickening, difficult to delineate without IV contrast. 3. Probable mediastinal lymphadenopathy, again difficult to definitively characterize without IV contrast, alternatively confluent fluid or edema within the mediastinum. 4. Small right pleural effusion. 5. Probable splenomegaly, incompletely imaged at the lower aspects of this chest CT. 6. Right IJ line in place with tip passing through the right atrium and into the right ventricle. Recommend retracting to the cavoatrial junction for optimal radiographic positioning. These results and recommendations were called by telephone at the time of interpretation on 11/10/2016 at 7:35 pm to Dr. Katrinka Blazing , who verbally acknowledged these results. Electronically Signed    By: Bary Richard M.D.   On: 11/10/2016 19:36   Ct Abdomen Pelvis W Contrast  Result Date: 11/14/2016 CLINICAL DATA:  Right upper quadrant pain times 2-3 days. Polysubstance abuse. Cholecystectomy. History of endocarditis and septic emboli. EXAM: CT ABDOMEN AND PELVIS WITH CONTRAST TECHNIQUE: Multidetector CT imaging of the abdomen and pelvis was performed using the standard protocol following bolus administration of intravenous contrast. CONTRAST:  80mL ISOVUE-300 IOPAMIDOL (ISOVUE-300) INJECTION 61% COMPARISON:  03/12/2016 chest CT FINDINGS: Lower chest: Small right effusion with several bilateral cavitary opacities in both lower lobes concerning for septic emboli or cavitary pneumonia. More confluent airspace opacity in the left lower lobe is also noted. The visualized heart is normal in size. There is no pericardial effusion. No large central pulmonary embolus. Hepatobiliary: Normal appearance of the liver without space-occupying mass or biliary dilatation. Cholecystectomy. Pancreas: No pancreatic mass or ductal dilatation. Mild enlargement of the pancreatic head with surrounding peripancreatic fluid is noted. Correlate to exclude a pancreatitis. Spleen: The spleen is enlarged measuring 15.7 x 5.6 x 17 cm (volume = 780 cm^3) and demonstrates a developmental cleft, partially visualized on prior chest CT and therefore believed less likely to represent a laceration. Adrenals/Urinary Tract: Adrenal glands are unremarkable. Kidneys are normal, without renal calculi, solid appearing lesions, or hydronephrosis. There appear to be pair of water attenuating cysts in the lower pole the right kidney measuring up to 2 x 1.3 cm in aggregate. Bladder is unremarkable. Stomach/Bowel: Stomach is within normal limits. Appendix appears normal. No evidence of bowel wall thickening, distention, or inflammatory changes. Vascular/Lymphatic: No significant vascular findings are present. No enlarged abdominal or pelvic lymph nodes.  Reproductive: Uterus and bilateral adnexa are unremarkable. Other: Moderate volume of ascites.  Anasarca. Musculoskeletal: No acute or significant osseous findings. IMPRESSION: 1. Bibasilar, multifocal cavitary lesions noted within the visualized lower lobes consistent with septic emboli or possibly cavitary pneumonia. More confluent airspace disease in the left lower lobe without cavitation is also present. There is a small right pleural effusion. 2. Anasarca with moderate volume of ascites noted within the abdomen and pelvis. Question right heart dysfunction/failure. 3. Splenomegaly. 4. Water attenuating cysts in the lower pole the right kidney in aggregate measuring 2 x 1.3 cm. 5. Status post cholecystectomy. Electronically Signed   By: Tollie Eth M.D.   On: 11/14/2016 16:26   Dg Chest 1v Repeat Same Day  Result Date: 11/18/2016 CLINICAL DATA:  Chest tube placement EXAM: CHEST - 1 VIEW SAME DAY COMPARISON:  Earlier same day FINDINGS: Large bore chest tube placed on the right along the  lateral margin. Marked reduction an amount of right pleural air, nearly completely evacuated. Patchy infiltrates persist in both lower lobes. IMPRESSION: Large bore chest tube placed. Near complete resolution of right pneumothorax. Electronically Signed   By: Paulina Fusi M.D.   On: 11/18/2016 11:40   Dg Chest Port 1 View  Result Date: 11/20/2016 CLINICAL DATA:  26 year old female with pneumothorax. Recent transesophageal echocardiogram. Subsequent encounter. EXAM: PORTABLE CHEST 1 VIEW COMPARISON:  11/20/2016 8:30 a.m. FINDINGS: Right-sided chest tube remains in place. No pneumothorax detected on this frontal projection. Cavitary lesions bilaterally. Left base consolidation. Atelectasis/pleural thickening right lung base. Cardiomegaly. IMPRESSION: No significant change since exam earlier today. Electronically Signed   By: Lacy Duverney M.D.   On: 11/20/2016 13:55   Dg Chest Port 1 View  Result Date: 11/20/2016 CLINICAL  DATA:  Chest tube, shortness of Breath EXAM: PORTABLE CHEST 1 VIEW COMPARISON:  11/19/2016 FINDINGS: Right chest tube remains in place, unchanged. No pneumothorax. Patchy bilateral airspace opacities are again noted, some with cavitation. Small right pleural effusion. Heart is borderline in size. IMPRESSION: Right chest tube remains in place without pneumothorax. Stable patchy bilateral airspace disease, some of which is cavitary. Electronically Signed   By: Charlett Nose M.D.   On: 11/20/2016 08:42   Dg Chest Port 1 View  Result Date: 11/19/2016 CLINICAL DATA:  26 year old female with cavitary lung lesions possibly from septic endocarditis. Subsequent encounter. EXAM: PORTABLE CHEST 1 VIEW COMPARISON:  11/18/2016. FINDINGS: Right-sided chest tube remains in place. Tiny hydropneumothorax versus fluid within minor fissure noted. Multiple cavitary lesions. Right-sided pleural effusion. Basilar atelectasis versus infiltrate greater on the left. Cardiomegaly. Mild scoliosis. IMPRESSION: Right-sided chest tube remains in place. Tiny hydropneumothorax versus fluid within minor fissure. Multiple cavitary lesions. Right-sided pleural effusion. Basilar atelectasis versus infiltrate greater on the left. Electronically Signed   By: Lacy Duverney M.D.   On: 11/19/2016 07:16   Dg Chest Port 1 View  Result Date: 11/18/2016 CLINICAL DATA:  Followup left pneumothorax. Patient with bacterial endocarditis. EXAM: PORTABLE CHEST 1 VIEW COMPARISON:  11/17/2016 and prior studies FINDINGS: A moderate right hydropneumothorax is unchanged in size, but now with pleural fluid in its basilar portion. A right thoracostomy tube is unchanged. Scattered pulmonary opacities/ nodules and left lower lung consolidations/atelectasis again noted. There has been no other interval change. IMPRESSION: Moderate right hydropneumothorax, unchanged in size but now with pleural fluid in its basilar portion. Right thoracostomy tube remains unchanged.  Electronically Signed   By: Harmon Pier M.D.   On: 11/18/2016 10:14   Dg Chest Port 1 View  Result Date: 11/17/2016 CLINICAL DATA:  Follow-up right-sided chest tube placement. Initial encounter. EXAM: PORTABLE CHEST 1 VIEW COMPARISON:  Chest radiograph performed 11/16/2016 FINDINGS: The patient's moderate right-sided pneumothorax has decreased in size. The right-sided chest tube is grossly unchanged in appearance. Vascular congestion is noted. Patchy left-sided airspace opacities raise concern for pneumonia. Right-sided airspace opacity may reflect atelectasis. A small left pleural effusion is noted. The cardiomediastinal silhouette is mildly enlarged. No acute osseous abnormalities are identified. IMPRESSION: 1. Moderate right-sided pneumothorax has decreased in size. Right-sided chest tube is grossly unchanged in appearance. 2. Vascular congestion and mild cardiomegaly. Patchy left-sided airspace opacities raise concern for pneumonia. Small left pleural effusion noted. 3. Right-sided airspace opacity may reflect atelectasis. Electronically Signed   By: Roanna Raider M.D.   On: 11/17/2016 00:33   Dg Chest Port 1 View  Result Date: 11/16/2016 CLINICAL DATA:  Chest tube placement for right pneumothorax.  EXAM: PORTABLE CHEST 1 VIEW COMPARISON:  Single-view of the chest earlier today and 11/11/2016. FINDINGS: Pigtail catheter is now in place in the right chest. Large right pneumothorax is unchanged. Patchy airspace disease in cavitary lesions in the left chest persist. Heart size is normal. IMPRESSION: No change in large right pneumothorax after chest tube placement. No change in patchy airspace disease in cavitary lesions on the left. These results were called by telephone at the time of interpretation on 11/16/2016 at 8:10 pm to Estrella Deeds, RN, who verbally acknowledged these results. Electronically Signed   By: Drusilla Kanner M.D.   On: 11/16/2016 20:12   Dg Chest Port 1 View  Result Date:  11/16/2016 CLINICAL DATA:  Shortness of Breath EXAM: PORTABLE CHEST 1 VIEW COMPARISON:  11/11/2016 FINDINGS: Cardiac shadow is within normal limits. Cavitary nodular densities are noted throughout both lungs similar to that seen on previous CT examination. A new right-sided moderately large pneumothorax is noted with consolidation of the lower lobe on the right. No acute bony abnormality is noted. IMPRESSION: Moderately large right-sided pneumothorax new from the prior exam. Near complete collapse of the right lower lobe is noted. Stable cavitary lesions within both lungs. Critical Value/emergent results were called by telephone at the time of interpretation on 11/16/2016 at 6:24 pm to Dr Leitha Bleak, who verbally acknowledged these results. Electronically Signed   By: Alcide Clever M.D.   On: 11/16/2016 18:27   Dg Chest Port 1 View  Result Date: 11/11/2016 CLINICAL DATA:  Check right IJ line placement.  Initial encounter. EXAM: PORTABLE CHEST 1 VIEW COMPARISON:  Chest radiograph performed 11/10/2016 FINDINGS: The right IJ line is noted ending at the right atrium. This could be retracted 2-3 cm. Mild vascular congestion is noted. Mild left-sided opacities may reflect atelectasis or mild pneumonia. No pleural effusion or pneumothorax is seen. The cardiomediastinal silhouette is borderline normal in size. No acute osseous abnormalities are identified. IMPRESSION: 1. Right IJ line noted ending at the right atrium. This could be retracted 2-3 cm, as deemed clinically appropriate. 2. Mild vascular congestion noted. 3. Mild left-sided airspace opacities may reflect atelectasis or mild pneumonia. Electronically Signed   By: Roanna Raider M.D.   On: 11/11/2016 00:37   Dg Chest Port 1 View  Result Date: 11/10/2016 CLINICAL DATA:  Retraction of the IJ line about 10 cm EXAM: PORTABLE CHEST 1 VIEW COMPARISON:  CT chest 11/10/2016.  Chest 11/10/2016. FINDINGS: The left central venous catheter tip localizes to the mid/distal  right atrial region. If placement at or above the cavoatrial junction is desired, retraction of about 3-4 cm is recommended. No pneumothorax. Shallow inspiration with atelectasis in the lung bases. Borderline heart size. Patchy airspace infiltrates in both lungs could indicate edema, aspiration, or pneumonia. No blunting of costophrenic angles. IMPRESSION: Central venous catheter tip is in the mid/ low right atrial region. If placement at or above the cavoatrial junction is desired, retraction of about 3-4 cm is recommended. Electronically Signed   By: Burman Nieves M.D.   On: 11/10/2016 21:35   Dg Chest Portable 1 View  Result Date: 11/10/2016 CLINICAL DATA:  Status post central line placement. EXAM: PORTABLE CHEST 1 VIEW COMPARISON:  Chest x-ray from earlier same day. FINDINGS: Interval placement of a right IJ central line with tip passing to the left of midline and likely in the right ventricle or main pulmonary artery. The bilateral small nodular airspace opacities throughout both lungs are unchanged in the short-term interval. Suspect small bilateral  pleural effusions. No pneumothorax seen. IMPRESSION: 1. Right IJ central line placement with tip positioned to the left of midline either within the right ventricle or main pulmonary artery. Recommend retracting at least 10 cm. 2. Bilateral small nodular airspace opacities throughout both lungs are unchanged in the short-term interval. As recommended on earlier chest x-ray report, would consider chest CT for further characterization. 3. Suspect small bilateral pleural effusions. Electronically Signed   By: Bary Richard M.D.   On: 11/10/2016 18:37    Lab Data:  CBC:  Recent Labs Lab 11/18/16 1356 11/19/16 0414 11/20/16 0239 11/21/16 0256 11/22/16 0231  WBC 10.7* 8.7 9.4 8.8 7.4  HGB 7.9* 7.7* 7.1* 8.2* 8.5*  HCT 25.0* 24.8* 22.6* 25.7* 26.3*  MCV 85.9 86.7 85.9 85.4 85.7  PLT 309 335 398 411* 407*   Basic Metabolic Panel:  Recent  Labs Lab 11/18/16 0214 11/19/16 0414 11/19/16 1525 11/19/16 1828 11/20/16 0239 11/21/16 0256 11/22/16 0231 11/23/16 0254  NA 138 139 135  --  137 136 136  --   K 4.1 3.9 5.5* 3.9 4.0 3.7 3.7  --   CL 109 109 110  --  109 106 105  --   CO2 21* 23 18*  --  22 22 22   --   GLUCOSE 97 97 85  --  81 93 88  --   BUN 14 15 14   --  10 9 9   --   CREATININE 0.99 1.37* 1.08*  --  0.93 0.95 0.84 0.78  CALCIUM 7.5* 7.9* 7.7*  --  7.9* 7.9* 8.0*  --   MG 1.7 2.2  --   --  1.7 1.7  --   --   PHOS 4.9* 6.0*  --   --  4.9* 4.8*  --   --    GFR: Estimated Creatinine Clearance: 88.2 mL/min (by C-G formula based on SCr of 0.78 mg/dL). Liver Function Tests:  Recent Labs Lab 11/16/16 2040 11/21/16 0256  AST  --  13*  ALT  --  8*  ALKPHOS  --  151*  BILITOT  --  0.4  PROT 5.8* 5.6*  ALBUMIN  --  1.5*   No results for input(s): LIPASE, AMYLASE in the last 168 hours. No results for input(s): AMMONIA in the last 168 hours. Coagulation Profile: No results for input(s): INR, PROTIME in the last 168 hours. Cardiac Enzymes:  Recent Labs Lab 11/18/16 0214  CKTOTAL 30*   BNP (last 3 results) No results for input(s): PROBNP in the last 8760 hours. HbA1C: No results for input(s): HGBA1C in the last 72 hours. CBG:  Recent Labs Lab 11/19/16 1223  GLUCAP 83   Lipid Profile: No results for input(s): CHOL, HDL, LDLCALC, TRIG, CHOLHDL, LDLDIRECT in the last 72 hours. Thyroid Function Tests: No results for input(s): TSH, T4TOTAL, FREET4, T3FREE, THYROIDAB in the last 72 hours. Anemia Panel: No results for input(s): VITAMINB12, FOLATE, FERRITIN, TIBC, IRON, RETICCTPCT in the last 72 hours. Urine analysis:    Component Value Date/Time   COLORURINE YELLOW 11/10/2016 2132   APPEARANCEUR CLEAR 11/10/2016 2132   LABSPEC 1.006 11/10/2016 2132   PHURINE 5.0 11/10/2016 2132   GLUCOSEU NEGATIVE 11/10/2016 2132   HGBUR MODERATE (A) 11/10/2016 2132   BILIRUBINUR NEGATIVE 11/10/2016 2132    KETONESUR NEGATIVE 11/10/2016 2132   PROTEINUR NEGATIVE 11/10/2016 2132   UROBILINOGEN 0.2 08/05/2010 1821   NITRITE NEGATIVE 11/10/2016 2132   LEUKOCYTESUR SMALL (A) 11/10/2016 2132     Gertha Lichtenberg M.D. [facility] 11/23/2016, 2:45 PM  Pager: [phone] Between 7am to 7pm - call Pager - [phone]  After 7pm go to [email or web address] - password [email or web address]  Call night coverage person covering after 7pm

## 2016-11-23 NOTE — Progress Notes (Signed)
Pharmacy Antibiotic Note  Savannah Palmer is a 26 y.o. 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  Pharmacy has been consulted for vancomycin dosing. She had persistently positive blood cultures with MRSA and was noted to also have subtherapeutic vancomycin levels. Her dose was increased and she was therapeutic (VT=19) on 5/31 on 1g IV q8h.  Due to the concern for achieving therapeutic levels and the persistence of MRSA in her blood cultures, ID added ceftaroline on 5/31. Vancomycin was discontinued on 6/1 and daptomycin was added on 6/2 due to lack of data for using ceftaroline monotherapy for endocarditis.   Patient also has septic emboli to lungs therefore daptomycin monotherapy was not appropriate and ceftaroline was continued for dual therapy.  Repeat blood cultures on 6/4 are no growth to date, patient has received 14 days of antimicrobial therapy. Discussed with ID and should be able to treat remaining infection with vancomycin alone now that bacterial burden has decreased. Will need to ensure vancomycin levels remain therapeutic (goal trough 15-20)  Plan: - Discontinue ceftaroline and daptomycin - Vancomycin 1500 mg loading dose x1 - Vancomycin 1g IV q8h - Monitor renal function, blood cultures and vancomycin trough at steady state  Height: 5\' 3"  (160 cm) Weight: 130 lb (59 kg) IBW/kg (Calculated) : 52.4  Temp (24hrs), Avg:98.5 F (36.9 C), Min:97.7 F (36.5 C), Max:99 F (37.2 C)   Recent Labs Lab 11/18/16 1356 11/19/16 0414 11/19/16 1525 11/20/16 0239 11/21/16 0256 11/22/16 0231 11/23/16 0254  WBC 10.7* 8.7  --  9.4 8.8 7.4  --   CREATININE  --  1.37* 1.08* 0.93 0.95 0.84 0.78    Estimated Creatinine Clearance: 88.2 mL/min (by C-G formula based on SCr of 0.78 mg/dL).    No Known Allergies  Antimicrobials this admission: 5/26 Zosyn x 1 5/26 Vanc >> 6/1;   6/8 >> 5/26 Cefepime >> 5/27 5/31 Teflaro >> 6/8 6/2 Daptomycin >>  6/8  Dose adjustments this admission: 5/29 1049 VT: 13 (subtherapeutic) on 750mg  q8h prior to 7th dose, inc to 1g q8h 5/31 2030 VT: 19 (therapeutic) - continue 1g q8h  Microbiology results: 5/26 UCx: NGF 5/26 BCx: 4/4 MRSA 5/26 MRSA PCR: positive  5/27 BCx: 2/2 MRSA  5/28 BCx: 2/2 MRSA 5/28 HIV antibody: non reactive 5/28 HCV RNA: ND 5/29 BCx: MRSA, BCID = MRSA 5/30 @ 0033BCx: 1/2 MRSA 5/30 @ 1610 BCx: ngF 5/31 RPR: ordered 6/1 pleural fluid Cx: MRSA 6/4 BCx: ngtd  Thank you for allowing pharmacy to be a part of this patient's care.  Casilda Carlsaylor Krimson Massmann, PharmD, BCPS PGY-2 Infectious Diseases Pharmacy Resident Pager: [phone] 11/23/2016 12:26 PM

## 2016-11-23 NOTE — Progress Notes (Signed)
Savannah Palmer is a 26 y.o. female patient admitted from 4E awake, alert - oriented  X 4 - no acute distress noted.  VSS - Blood pressure 117/61, pulse 84, temperature 97.7 F (36.5 C), temperature source Oral, resp. rate 18, height 5\' 3"  (1.6 m), weight 59 kg (130 lb), last menstrual period 10/27/2016, SpO2 100 %.    IV in place, occlusive dsg intact without redness.  Orientation to room, and floor completed with information packet given to patient/family. Admission INP armband ID verified with patient/family, and in place.   SR up x 2, fall assessment complete, with patient and family able to verbalize understanding of risk associated with falls, and verbalized understanding to call nsg before up out of bed.  Call light within reach, patient able to voice, and demonstrate understanding.  Skin, clean-dry- intact without evidence of bruising, or skin tears.   No evidence of skin break down noted on exam.     Will cont to eval and treat per MD orders.  Rock NephewLisbeth  Derald Lorge, RN 11/23/2016 10:53 AM

## 2016-11-24 LAB — BASIC METABOLIC PANEL
Anion gap: 8 (ref 5–15)
BUN: 7 mg/dL (ref 6–20)
CALCIUM: 7.9 mg/dL — AB (ref 8.9–10.3)
CO2: 24 mmol/L (ref 22–32)
CREATININE: 0.79 mg/dL (ref 0.44–1.00)
Chloride: 107 mmol/L (ref 101–111)
GFR calc non Af Amer: >60 mL/min (ref >60)
Glucose, Bld: 89 mg/dL (ref 65–99)
Potassium: 3.4 mmol/L — ABNORMAL LOW (ref 3.5–5.1)
SODIUM: 139 mmol/L (ref 135–145)

## 2016-11-24 LAB — CBC
HCT: 23.8 % — ABNORMAL LOW (ref 36.0–46.0)
Hemoglobin: 7.5 g/dL — ABNORMAL LOW (ref 12.0–15.0)
MCH: 27.3 pg (ref 26.0–34.0)
MCHC: 31.5 g/dL (ref 30.0–36.0)
MCV: 86.5 fL (ref 78.0–100.0)
Platelets: 360 10*3/uL (ref 150–400)
RBC: 2.75 MIL/uL — ABNORMAL LOW (ref 3.87–5.11)
RDW: 15.4 % (ref 11.5–15.5)
WBC: 5.4 10*3/uL (ref 4.0–10.5)

## 2016-11-24 LAB — CULTURE, BLOOD (ROUTINE X 2)
Culture: NO GROWTH
Culture: NO GROWTH
SPECIAL REQUESTS: ADEQUATE
SPECIAL REQUESTS: ADEQUATE

## 2016-11-24 MED ORDER — POTASSIUM CHLORIDE CRYS ER 20 MEQ PO TBCR
40.0000 meq | EXTENDED_RELEASE_TABLET | Freq: Once | ORAL | Status: AC
  Administered 2016-11-24: 40 meq via ORAL
  Filled 2016-11-24: qty 2

## 2016-11-24 MED ORDER — SODIUM CHLORIDE 0.9% FLUSH
10.0000 mL | INTRAVENOUS | Status: DC | PRN
  Administered 2016-11-27 – 2016-12-30 (×19): 10 mL
  Filled 2016-11-24 (×19): qty 40

## 2016-11-24 NOTE — Clinical Social Work Note (Signed)
CSW notified pt will likely need SNF tomorrow, pt needs long term antibiotics(Vancomycin) (Til July 15th) pt has no accepting SNF at this time and pt has Nurse, learning disabilitycommercial insurance which will require AUTH, pt will likely be here until early next week pending SNF acceptance and ins auth.  CSW will continue to follow.  Dahlila Pfahler B. Gean QuintBrown,MSW, LCSWA Clinical Social Work Dept Weekend Social Worker [phone] 4:29 PM

## 2016-11-24 NOTE — Progress Notes (Signed)
Peripherally Inserted Central Catheter/Midline Placement  The IV Nurse has discussed with the patient and/or persons authorized to consent for the patient, the purpose of this procedure and the potential benefits and risks involved with this procedure.  The benefits include less needle sticks, lab draws from the catheter, and the patient may be discharged home with the catheter. Risks include, but not limited to, infection, bleeding, blood clot (thrombus formation), and puncture of an artery; nerve damage and irregular heartbeat and possibility to perform a PICC exchange if needed/ordered by physician.  Alternatives to this procedure were also discussed.  Bard Power PICC patient education guide, fact sheet on infection prevention and patient information card has been provided to patient /or left at bedside.    PICC/Midline Placement Documentation  PICC Single Lumen 11/24/16 PICC Right Brachial 35 cm 0 cm (Active)  Indication for Insertion or Continuance of Line Home intravenous therapies (PICC only) 11/24/2016  4:22 PM  Exposed Catheter (cm) 0 cm 11/24/2016  4:22 PM  Site Assessment Clean;Dry;Intact 11/24/2016  4:22 PM  Line Status Flushed;Saline locked;Blood return noted 11/24/2016  4:22 PM  Dressing Type Transparent 11/24/2016  4:22 PM  Dressing Status Clean;Dry;Intact;Antimicrobial disc in place 11/24/2016  4:22 PM  Line Care Connections checked and tightened 11/24/2016  4:22 PM  Line Adjustment (NICU/IV Team Only) No 11/24/2016  4:22 PM  Dressing Intervention New dressing 11/24/2016  4:22 PM  Dressing Change Due 12/01/16 11/24/2016  4:22 PM       Elliot Dallyiggs, [place] Wright 11/24/2016, 4:23 PM

## 2016-11-24 NOTE — Progress Notes (Signed)
[facility]                                                                              Patient Demographics  Savannah Palmer, is a 26 y.o. female, DOB - 1990-08-29, ZOX:[id number]  Admit date - 11/10/2016   Admitting Physician Coralyn Helling, MD  Outpatient Primary MD for the patient is Patient, No Pcp Per  Outpatient specialists:   LOS - 14  days   Medical records reviewed and are as summarized below:    No chief complaint on file.      Brief summary    26 year old Caucasian female with a past medical history of anxiety, depression, heroin abuse, presented with weakness, cough and chest pain. Initially was in septic shock and required pressors. Blood cultures grew MRSA. 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 She was placed on IV antibiotics. She also developed a spontaneous right-sided pneumothorax. Chest tube was placed by critical care medicine. She was transferred to [facility] for opinion from cardiothoracic surgery. Patient is not a candidate for surgery at this time.   Assessment & Plan    Principal Problem: MRSA bacteremia/Septic shock with right TV endocarditis and septic emboli to lungs/MRSA empyema - Patient presented with weakness, coughing and chest pain and septic shock requiring vasopressors. Blood cultures 5/27 grew out MRSA and was found to have septic emboli throughout her lungs.  -Repeat cultures were still positive, blood cultures repeat on 6/4 has been negative to date  - 2-D echo on 5/27 showed tricuspid vegetations with moderate TR. TEE on 6/1 showed large tricuspid vegetation, severe TR with dilated RV along with a loculated left effusion -HIV, Hep C negative. Due to family request, RPR was done and it was negative. - Due to persistent bacteremia patient was started on daptomycin 6/2 and Ceftaroline continued to cover lung infection. - cardiothoracic  surgery was also consulted, not a candidate for surgery at this time.  - Per ID recommendations today, DC Ceftaroline and daptomycin, use vancomycin alone, duration through July 15, will need BMP, CBC weekly, trough goal of 15-20.  - PICC line will be placed today  Active problems: Right spontaneous pneumothorax- acute respiratory failure/MRSA Empyema -Right empyema with pneumothorax, positive for MRSA. Status post chest tube. - Chest tube removed on 6/7, chest x-ray clear   Acute kidney injury -Baseline creatinine 0.7, creatinine increased to 1.37, likely secondary to dehydration and NSAIDs. - patient received IV fluids, NSAIDs were discontinued, and creatinine function has normalized.   RUQ pain, likely musculoskeletal -CT abdomen and pelvis showed no liver or gallbladder abnormalities, possible musculoskeletal origin, currently stable.    Ascites, anasarca due to right sided hear failure - as a result of severe TR, net positive balance of 15L - limit IVF, hold off on administering diuretics in setting of sepsis and borderline BPs - O2 sats 96% on room air  Splenomegaly - etiology undetermined- CT on 5/30 revealed that the spleen is enlarged measuring 15.7 x 5.6 x 17 cm (volume= 780 cm^3) - liver is unremarkable on CT  Normocytic Anemia -  H&H currently stable, was transfused on 6/5, no overt bleeding noted - Iron was 12. Ferritin 213. B-12 1850. Folate 7.8.  Hypokalemia/Hypophosphatemia/Hypomagnesemia - Resolved  Thrombocytopenia - Resolved  Heroin abuse - cont Suboxone- will need transition to a drug rehab program once stable.   Anxiety - cont Buspar, Neurontin, Xanax  Nicotine dependence -Continue  Nicoderm patch   Code Status: Full CODE STATUS  DVT Prophylaxis:  heparin  Family Communication: Discussed in detail with the patient, all imaging results, lab results explained to the patient. Discussed with patient's mother at the bedside  today   Disposition Plan: Hopefully DC to skilled nursing facility once bed is available  Time Spent in minutes  25 minutes  Procedures:  TEE  - Left ventricle: The cavity size was normal. Wall thickness was normal. Systolic function was normal. The estimated ejection fraction was in the range of 55% to 60%. Wall motion was normal; there were no regional wall motion abnormalities. - Left atrium: No evidence of thrombus in the atrial cavity or appendage. - Right ventricle: The cavity size was mildly dilated. Wall thickness was normal. - Right atrium: No evidence of thrombus in the atrial cavity or appendage. - Tricuspid valve: Flail motion of the posterior leaflet. There is a 8x5 mm vegetation attached to the posterior leaflet, but most of the &quot;mass&quot; described on transthoracic imaging appears to be the flail segment of the valve. There was severe regurgitation directed eccentrically and toward the free wall.  Right chest tube placement for pneumothorax 6/1  Placement of a larger bore chest tube in the right chest 6/3  Consultants:   Infectious disease Cardiothoracic surgery  Antimicrobials:   Daptomycin 6/2> 6/8  Teflaro 5/31> 6/8  Vancomycin 6/8>> will need to July 15   Medications  Scheduled Meds: . acetaminophen  1,000 mg Oral Q6H  . buprenorphine-naloxone  2 tablet Sublingual Daily  . busPIRone  10 mg Oral BID  . citalopram  10 mg Oral Daily  . gabapentin  300 mg Oral TID  . heparin  5,000 Units Subcutaneous Q8H  . nicotine  14 mg Transdermal Daily  . saccharomyces boulardii  250 mg Oral BID   Continuous Infusions: . sodium chloride 250 mL (11/19/16 0700)  . vancomycin Stopped (11/24/16 0957)   PRN Meds:.sodium chloride, albuterol, ALPRAZolam, docusate, nicotine polacrilex, ondansetron (ZOFRAN) IV, oxyCODONE   Antibiotics   Anti-infectives    Start     Dose/Rate Route Frequency Ordered Stop   11/24/16 0000   vancomycin (VANCOCIN) IVPB 1000 mg/200 mL premix     1,000 mg 200 mL/hr over 60 Minutes Intravenous Every 8 hours 11/23/16 1238     11/23/16 1600  vancomycin (VANCOCIN) 1,500 mg in sodium chloride 0.9 % 500 mL IVPB     1,500 mg 250 mL/hr over 120 Minutes Intravenous  Once 11/23/16 1238 11/23/16 1733   11/17/16 1600  DAPTOmycin (CUBICIN) 500 mg in sodium chloride 0.9 % IVPB  Status:  Discontinued     500 mg 220 mL/hr over 30 Minutes Intravenous Every 24 hours 11/17/16 1432 11/23/16 1238   11/17/16 1500  ceftaroline (TEFLARO) 600 mg in sodium chloride 0.9 % 250 mL IVPB  Status:  Discontinued     600 mg 250 mL/hr over 60 Minutes Intravenous Every 12 hours 11/17/16 1435 11/23/16 1238   11/15/16 1800  ceftaroline (TEFLARO) 600 mg in sodium chloride 0.9 % 250 mL IVPB  Status:  Discontinued     600 mg 250 mL/hr over 60 Minutes Intravenous Every  12 hours 11/15/16 1635 11/17/16 1431   11/13/16 1215  vancomycin (VANCOCIN) IVPB 1000 mg/200 mL premix  Status:  Discontinued     1,000 mg 200 mL/hr over 60 Minutes Intravenous Every 8 hours 11/13/16 1203 11/16/16 0850   11/11/16 1100  vancomycin (VANCOCIN) IVPB 750 mg/150 ml premix  Status:  Discontinued     750 mg 150 mL/hr over 60 Minutes Intravenous Every 8 hours 11/11/16 0956 11/13/16 1203   11/11/16 0400  vancomycin (VANCOCIN) 500 mg in sodium chloride 0.9 % 100 mL IVPB  Status:  Discontinued     500 mg 100 mL/hr over 60 Minutes Intravenous Every 12 hours 11/10/16 1951 11/11/16 0956   11/10/16 2200  ceFEPIme (MAXIPIME) 1 g in dextrose 5 % 50 mL IVPB  Status:  Discontinued     1 g 100 mL/hr over 30 Minutes Intravenous Every 12 hours 11/10/16 1951 11/11/16 0926   11/10/16 2000  ceFEPIme (MAXIPIME) 2 g in dextrose 5 % 50 mL IVPB  Status:  Discontinued     2 g 100 mL/hr over 30 Minutes Intravenous  Once 11/10/16 1952 11/10/16 1954   11/10/16 2000  vancomycin (VANCOCIN) IVPB 1000 mg/200 mL premix  Status:  Discontinued     1,000 mg 200 mL/hr over 60  Minutes Intravenous  Once 11/10/16 1952 11/10/16 1954   11/10/16 1430  piperacillin-tazobactam (ZOSYN) IVPB 3.375 g     3.375 g 100 mL/hr over 30 Minutes Intravenous  Once 11/10/16 1426 11/10/16 1503   11/10/16 1430  vancomycin (VANCOCIN) IVPB 1000 mg/200 mL premix     1,000 mg 200 mL/hr over 60 Minutes Intravenous  Once 11/10/16 1426 11/10/16 1653        Subjective:   Savannah Palmer was seen and examined today. No complaints, mother at the bedside. No fevers or chills, denies any specific complaints. Patient denies dizziness,  shortness of breath, abdominal pain, N/V/D/C, new weakness, numbess, tingling. No acute events overnight.    Objective:   Vitals:   11/23/16 1428 11/23/16 2055 11/24/16 0500 11/24/16 0618  BP: 118/76 121/77  119/79  Pulse: 84 86  71  Resp: 18 18  18   Temp: 98.6 F (37 C) 98.4 F (36.9 C)  98.5 F (36.9 C)  TempSrc:    Oral  SpO2: 96% 97%  96%  Weight:   59.4 kg (131 lb)   Height:        Intake/Output Summary (Last 24 hours) at 11/24/16 1303 Last data filed at 11/24/16 0655  Gross per 24 hour  Intake             1534 ml  Output                0 ml  Net             1534 ml     Wt Readings from Last 3 Encounters:  11/24/16 59.4 kg (131 lb)  03/14/16 58.5 kg (129 lb)  01/19/16 58.1 kg (128 lb)     Exam  General: Alert and oriented x 3, NAD  Eyes: PERRLA, EOMI.  HEENT:  Atraumatic, normocephalic, normal oropharynx  Cardiovascular: S1 and S2 clear, RRR, systolic murmur in tricuspid area.   Respiratory: Clear to auscultation bilaterally  Gastrointestinal: Soft, nontender, nondistended, normal bowel sounds  Ext: no significant edema  Neuro: no new focal neurological deficits  Musculoskeletal: No digital cyanosis, clubbing  Skin: No rashes  Psych: alert and oriented 3, normal affect   Data Reviewed:  I  have personally reviewed following labs and imaging studies  Micro Results Recent Results (from the past 240 hour(s))   Culture, blood (Routine X 2) w Reflex to ID Panel     Status: Abnormal   Collection Time: 11/14/16  4:10 PM  Result Value Ref Range Status   Specimen Description BLOOD RIGHT ARM  Final   Special Requests IN PEDIATRIC BOTTLE Blood Culture adequate volume  Final   Culture  Setup Time   Final    IN PEDIATRIC BOTTLE GRAM POSITIVE COCCI IN CLUSTERS CRITICAL RESULT CALLED TO, READ BACK BY AND VERIFIED WITH: PHARMD A PHAM [id number] 1036AM MLM    Culture (A)  Final    STAPHYLOCOCCUS AUREUS SUSCEPTIBILITIES PERFORMED ON PREVIOUS CULTURE WITHIN THE LAST 5 DAYS. Performed at [facility], [address], [place], [place] [address]    Report Status 11/17/2016 FINAL  Final  Culture, blood (Routine X 2) w Reflex to ID Panel     Status: None   Collection Time: 11/14/16  4:10 PM  Result Value Ref Range Status   Specimen Description BLOOD RIGHT ARM  Final   Special Requests   Final    BOTTLES DRAWN AEROBIC ONLY Blood Culture adequate volume   Culture   Final    NO GROWTH 5 DAYS Performed at [facility], [address] [place], [place] [address]    Report Status 11/19/2016 FINAL  Final  MRSA PCR Screening     Status: None   Collection Time: 11/16/16  5:49 PM  Result Value Ref Range Status   MRSA by PCR NEGATIVE NEGATIVE Final    Comment:        The GeneXpert MRSA Assay (FDA approved for NASAL specimens only), is one component of a comprehensive MRSA colonization surveillance program. It is not intended to diagnose MRSA infection nor to guide or monitor treatment for MRSA infections. DELTA CHECK NOTED   Body fluid culture (includes gram stain)     Status: None   Collection Time: 11/16/16  8:06 PM  Result Value Ref Range Status   Specimen Description PLEURAL FLUID  Final   Special Requests NONE  Final   Gram Stain   Final    FEW WBC PRESENT, PREDOMINANTLY PMN NO ORGANISMS SEEN    Culture   Final    FEW METHICILLIN RESISTANT STAPHYLOCOCCUS AUREUS RESULT CALLED TO, READ  BACK BY AND VERIFIED WITH: R. MYRICK RN, AT 1023 11/18/16 BY D. VANHOOK REGARDING CULTURE GROWTH INTERPRET RESULTS WITH CAUTION DUE TO LIMITED SPECIMEN VOLUME Performed at [facility], [address], [place], [place] [address]    Report Status 11/20/2016 FINAL  Final   Organism ID, Bacteria METHICILLIN RESISTANT STAPHYLOCOCCUS AUREUS  Final      Susceptibility   Methicillin resistant staphylococcus aureus - MIC*    CIPROFLOXACIN >=8 RESISTANT Resistant     ERYTHROMYCIN >=8 RESISTANT Resistant     GENTAMICIN <=0.5 SENSITIVE Sensitive     OXACILLIN >=4 RESISTANT Resistant     TETRACYCLINE <=1 SENSITIVE Sensitive     VANCOMYCIN 1 SENSITIVE Sensitive     TRIMETH/SULFA <=10 SENSITIVE Sensitive     CLINDAMYCIN <=0.25 SENSITIVE Sensitive     RIFAMPIN <=0.5 SENSITIVE Sensitive     Inducible Clindamycin NEGATIVE Sensitive     * FEW METHICILLIN RESISTANT STAPHYLOCOCCUS AUREUS  Culture, blood (routine x 2)     Status: None (Preliminary result)   Collection Time: 11/19/16  8:57 AM  Result Value Ref Range Status  Specimen Description BLOOD RIGHT ARM  Final   Special Requests IN PEDIATRIC BOTTLE Blood Culture adequate volume  Final   Culture NO GROWTH 4 DAYS  Final   Report Status PENDING  Incomplete  Culture, blood (routine x 2)     Status: None (Preliminary result)   Collection Time: 11/19/16  9:05 AM  Result Value Ref Range Status   Specimen Description BLOOD RIGHT ANTECUBITAL  Final   Special Requests IN PEDIATRIC BOTTLE Blood Culture adequate volume  Final   Culture NO GROWTH 4 DAYS  Final   Report Status PENDING  Incomplete    Radiology Reports Dg Chest 2 View  Result Date: 11/23/2016 CLINICAL DATA:  Chest tube removal EXAM: CHEST  2 VIEW COMPARISON:  11/21/2016 FINDINGS: Interval removal of right chest tube with residual small right pleural effusion versus pleural thickening. No pneumothorax. Patchy bilateral airspace opacities are again noted, unchanged. Heart is borderline in  size. IMPRESSION: Interval removal of right chest tube without pneumothorax. Otherwise no change. Electronically Signed   By: Charlett Nose M.D.   On: 11/23/2016 08:44   Dg Chest 2 View  Result Date: 11/21/2016 CLINICAL DATA:  Chest soreness.  Chest tube . EXAM: CHEST  2 VIEW COMPARISON:  CT 11/20/2016.  Chest x-ray 11/20/2016 FINDINGS: Chest tube noted on the right. Tiny right anterior hydropneumothorax again noted. No interim change . Stable cardiomegaly. Persistent bilateral pulmonary nodules including cavitary nodules. Reference is made to prior CT report. Small right pleural effusion. No pneumothorax. IMPRESSION: 1. Right chest tube in stable position. Tiny right anterior hydropneumothorax again noted. No interim change. 2. Multiple bilateral pulmonary nodules with cavitation. Reference is made to prior CT report of 11/20/2016 . Electronically Signed   By: Maisie Fus  Register   On: 11/21/2016 11:15   Dg Chest 2 View  Result Date: 11/19/2016 CLINICAL DATA:  Pneumothorax. EXAM: CHEST  2 VIEW COMPARISON:  11/19/2016. FINDINGS: Right chest tube in stable position. No significant pneumothorax noted on today's exam. Small right pleural effusion. Bilateral patchy pulmonary infiltrates with cavitation again noted. Small left pleural effusion noted. Heart size stable. No acute bony abnormality . IMPRESSION: 1. Right chest tube in stable position. No pneumothorax noted on today's exam. Small right pleural effusion. 2. Persistent bilateral cavitary pulmonary infiltrates. Small left pleural effusion. Electronically Signed   By: Maisie Fus  Register   On: 11/19/2016 16:53   Dg Chest 2 View  Result Date: 11/10/2016 CLINICAL DATA:  Chest pain, shortness of breath for 1 week EXAM: CHEST  2 VIEW COMPARISON:  CT chest 03/12/2016, chest x-ray 03/12/2016 FINDINGS: There are bilateral nodular airspace opacities throughout the upper and lower lobes bilaterally. There is no pleural effusion or pneumothorax. The heart and  mediastinal contours are unremarkable. The osseous structures are unremarkable. IMPRESSION: New bilateral nodular airspace opacities throughout the upper and lower lobes bilaterally. Findings are concerning for multifocal infection such as septic emboli. Recommend further evaluation with a CT of the chest with intravenous contrast. Electronically Signed   By: Elige Ko   On: 11/10/2016 13:23   Ct Chest Wo Contrast  Result Date: 11/20/2016 CLINICAL DATA:  Follow-up right pleural effusion EXAM: CT CHEST WITHOUT CONTRAST TECHNIQUE: Multidetector CT imaging of the chest was performed following the standard protocol without IV contrast. COMPARISON:  Radiograph 11/20/2016, CT chest 11/10/2016, prior radiographs dating back to 11/10/2016 FINDINGS: Cardiovascular: Limited assessment without intravenous contrast. Small fluid in the superior pericardial recess. There is cardiomegaly. Mediastinum/Nodes: Difficult evaluation without contrast. Mediastinal adenopathy is present  right paratracheal soft tissue fullness, possible adenopathy does not appear significantly changed. Midline trachea. Esophagus grossly unremarkable. Lungs/Pleura: Interim placement of a right lower lateral chest tube since the prior CT with the tip terminating along the right upper posterior pleural surface. Small bilateral pleural effusions right greater than left, slightly increased compared to the previous CT. Tiny right anterior hydropneumothorax. Multiple bilateral pulmonary nodules and cavitary lesions. Some of the cavitary lesions have decreased, however if there appears to be increased nodules/disease most notable in the left upper lobe and the right lower lobe. Decreased septal thickening compared to the previous exam. Upper Abdomen: Spleen appears enlarged. Partially visualized surgical clips in the gallbladder fossa. Musculoskeletal: No acute or suspicious bone lesion. IMPRESSION: 1. Interim placement of right-sided chest tube. Small  residual pleural effusions right greater than left, mildly increased compared to the prior chest CT. Small right anterior hydropneumothorax. 2. Innumerable bilateral lung nodules and cavitary lesions, suspected to represent septic emboli. This appears increased in the left upper and right lower lobe since the prior CT. 3. Suspect mediastinal adenopathy although evaluation of the mediastinum is limited without contrast 4. Splenomegaly Electronically Signed   By: Jasmine Pang M.D.   On: 11/20/2016 21:25   Ct Chest Wo Contrast  Result Date: 11/10/2016 CLINICAL DATA:  She c/o some chest discomfrot and shortness of breath x 1 week. She states these are similar s/s she had a year ago, at which time she was dx with "pulmonary abscess". She is in no distress. IV drug user EXAM: CT CHEST WITHOUT CONTRAST TECHNIQUE: Multidetector CT imaging of the chest was performed following the standard protocol without IV contrast. COMPARISON:  Chest CT angiogram dated 03/12/2016. FINDINGS: Cardiovascular: Probable small pericardial effusion and/or pericardial thickening, difficult to characterize without IV contrast. Heart size is within normal limits. Thoracic aorta appears to be normal in caliber. Mediastinum/Nodes: Soft tissue density material within the right upper peritracheal space, also difficult to characterize without IV contrast, presumed lymphadenopathy. Additional mild lymphadenopathy within the anterior mediastinum and aortopulmonary window region. Lungs/Pleura: Numerous cavitary consolidations throughout both lungs, peripherally distributed suggesting septic emboli. More confluent consolidations at each lung base, more likely atelectasis than pneumonia. Small right pleural effusion. Upper Abdomen: Suspected splenomegaly, incompletely imaged. Musculoskeletal: No acute or suspicious osseous finding. IMPRESSION: 1. Numerous cavitary consolidations throughout both lungs, with a peripheral distribution suggesting septic  emboli. Largest cavitary consolidation is within the left lower lobe measuring approximately 4 cm greatest dimension. 2. Probable small pericardial effusion and/or pericardial wall thickening, difficult to delineate without IV contrast. 3. Probable mediastinal lymphadenopathy, again difficult to definitively characterize without IV contrast, alternatively confluent fluid or edema within the mediastinum. 4. Small right pleural effusion. 5. Probable splenomegaly, incompletely imaged at the lower aspects of this chest CT. 6. Right IJ line in place with tip passing through the right atrium and into the right ventricle. Recommend retracting to the cavoatrial junction for optimal radiographic positioning. These results and recommendations were called by telephone at the time of interpretation on 11/10/2016 at 7:35 pm to Dr. Katrinka Blazing , who verbally acknowledged these results. Electronically Signed   By: Bary Richard M.D.   On: 11/10/2016 19:36   Ct Abdomen Pelvis W Contrast  Result Date: 11/14/2016 CLINICAL DATA:  Right upper quadrant pain times 2-3 days. Polysubstance abuse. Cholecystectomy. History of endocarditis and septic emboli. EXAM: CT ABDOMEN AND PELVIS WITH CONTRAST TECHNIQUE: Multidetector CT imaging of the abdomen and pelvis was performed using the standard protocol following bolus administration of  intravenous contrast. CONTRAST:  80mL ISOVUE-300 IOPAMIDOL (ISOVUE-300) INJECTION 61% COMPARISON:  03/12/2016 chest CT FINDINGS: Lower chest: Small right effusion with several bilateral cavitary opacities in both lower lobes concerning for septic emboli or cavitary pneumonia. More confluent airspace opacity in the left lower lobe is also noted. The visualized heart is normal in size. There is no pericardial effusion. No large central pulmonary embolus. Hepatobiliary: Normal appearance of the liver without space-occupying mass or biliary dilatation. Cholecystectomy. Pancreas: No pancreatic mass or ductal dilatation.  Mild enlargement of the pancreatic head with surrounding peripancreatic fluid is noted. Correlate to exclude a pancreatitis. Spleen: The spleen is enlarged measuring 15.7 x 5.6 x 17 cm (volume = 780 cm^3) and demonstrates a developmental cleft, partially visualized on prior chest CT and therefore believed less likely to represent a laceration. Adrenals/Urinary Tract: Adrenal glands are unremarkable. Kidneys are normal, without renal calculi, solid appearing lesions, or hydronephrosis. There appear to be pair of water attenuating cysts in the lower pole the right kidney measuring up to 2 x 1.3 cm in aggregate. Bladder is unremarkable. Stomach/Bowel: Stomach is within normal limits. Appendix appears normal. No evidence of bowel wall thickening, distention, or inflammatory changes. Vascular/Lymphatic: No significant vascular findings are present. No enlarged abdominal or pelvic lymph nodes. Reproductive: Uterus and bilateral adnexa are unremarkable. Other: Moderate volume of ascites.  Anasarca. Musculoskeletal: No acute or significant osseous findings. IMPRESSION: 1. Bibasilar, multifocal cavitary lesions noted within the visualized lower lobes consistent with septic emboli or possibly cavitary pneumonia. More confluent airspace disease in the left lower lobe without cavitation is also present. There is a small right pleural effusion. 2. Anasarca with moderate volume of ascites noted within the abdomen and pelvis. Question right heart dysfunction/failure. 3. Splenomegaly. 4. Water attenuating cysts in the lower pole the right kidney in aggregate measuring 2 x 1.3 cm. 5. Status post cholecystectomy. Electronically Signed   By: Tollie Eth M.D.   On: 11/14/2016 16:26   Dg Chest 1v Repeat Same Day  Result Date: 11/18/2016 CLINICAL DATA:  Chest tube placement EXAM: CHEST - 1 VIEW SAME DAY COMPARISON:  Earlier same day FINDINGS: Large bore chest tube placed on the right along the lateral margin. Marked reduction an  amount of right pleural air, nearly completely evacuated. Patchy infiltrates persist in both lower lobes. IMPRESSION: Large bore chest tube placed. Near complete resolution of right pneumothorax. Electronically Signed   By: Paulina Fusi M.D.   On: 11/18/2016 11:40   Dg Chest Port 1 View  Result Date: 11/20/2016 CLINICAL DATA:  26 year old female with pneumothorax. Recent transesophageal echocardiogram. Subsequent encounter. EXAM: PORTABLE CHEST 1 VIEW COMPARISON:  11/20/2016 8:30 a.m. FINDINGS: Right-sided chest tube remains in place. No pneumothorax detected on this frontal projection. Cavitary lesions bilaterally. Left base consolidation. Atelectasis/pleural thickening right lung base. Cardiomegaly. IMPRESSION: No significant change since exam earlier today. Electronically Signed   By: Lacy Duverney M.D.   On: 11/20/2016 13:55   Dg Chest Port 1 View  Result Date: 11/20/2016 CLINICAL DATA:  Chest tube, shortness of Breath EXAM: PORTABLE CHEST 1 VIEW COMPARISON:  11/19/2016 FINDINGS: Right chest tube remains in place, unchanged. No pneumothorax. Patchy bilateral airspace opacities are again noted, some with cavitation. Small right pleural effusion. Heart is borderline in size. IMPRESSION: Right chest tube remains in place without pneumothorax. Stable patchy bilateral airspace disease, some of which is cavitary. Electronically Signed   By: Charlett Nose M.D.   On: 11/20/2016 08:42   Dg Chest [facility] [address]  Result Date: 11/19/2016 CLINICAL DATA:  26 year old female with cavitary lung lesions possibly from septic endocarditis. Subsequent encounter. EXAM: PORTABLE CHEST 1 VIEW COMPARISON:  11/18/2016. FINDINGS: Right-sided chest tube remains in place. Tiny hydropneumothorax versus fluid within minor fissure noted. Multiple cavitary lesions. Right-sided pleural effusion. Basilar atelectasis versus infiltrate greater on the left. Cardiomegaly. Mild scoliosis. IMPRESSION: Right-sided chest tube remains in place. Tiny  hydropneumothorax versus fluid within minor fissure. Multiple cavitary lesions. Right-sided pleural effusion. Basilar atelectasis versus infiltrate greater on the left. Electronically Signed   By: Lacy Duverney M.D.   On: 11/19/2016 07:16   Dg Chest Port 1 View  Result Date: 11/18/2016 CLINICAL DATA:  Followup left pneumothorax. Patient with bacterial endocarditis. EXAM: PORTABLE CHEST 1 VIEW COMPARISON:  11/17/2016 and prior studies FINDINGS: A moderate right hydropneumothorax is unchanged in size, but now with pleural fluid in its basilar portion. A right thoracostomy tube is unchanged. Scattered pulmonary opacities/ nodules and left lower lung consolidations/atelectasis again noted. There has been no other interval change. IMPRESSION: Moderate right hydropneumothorax, unchanged in size but now with pleural fluid in its basilar portion. Right thoracostomy tube remains unchanged. Electronically Signed   By: Harmon Pier M.D.   On: 11/18/2016 10:14   Dg Chest Port 1 View  Result Date: 11/17/2016 CLINICAL DATA:  Follow-up right-sided chest tube placement. Initial encounter. EXAM: PORTABLE CHEST 1 VIEW COMPARISON:  Chest radiograph performed 11/16/2016 FINDINGS: The patient's moderate right-sided pneumothorax has decreased in size. The right-sided chest tube is grossly unchanged in appearance. Vascular congestion is noted. Patchy left-sided airspace opacities raise concern for pneumonia. Right-sided airspace opacity may reflect atelectasis. A small left pleural effusion is noted. The cardiomediastinal silhouette is mildly enlarged. No acute osseous abnormalities are identified. IMPRESSION: 1. Moderate right-sided pneumothorax has decreased in size. Right-sided chest tube is grossly unchanged in appearance. 2. Vascular congestion and mild cardiomegaly. Patchy left-sided airspace opacities raise concern for pneumonia. Small left pleural effusion noted. 3. Right-sided airspace opacity may reflect atelectasis.  Electronically Signed   By: Roanna Raider M.D.   On: 11/17/2016 00:33   Dg Chest Port 1 View  Result Date: 11/16/2016 CLINICAL DATA:  Chest tube placement for right pneumothorax. EXAM: PORTABLE CHEST 1 VIEW COMPARISON:  Single-view of the chest earlier today and 11/11/2016. FINDINGS: Pigtail catheter is now in place in the right chest. Large right pneumothorax is unchanged. Patchy airspace disease in cavitary lesions in the left chest persist. Heart size is normal. IMPRESSION: No change in large right pneumothorax after chest tube placement. No change in patchy airspace disease in cavitary lesions on the left. These results were called by telephone at the time of interpretation on 11/16/2016 at 8:10 pm to Estrella Deeds, RN, who verbally acknowledged these results. Electronically Signed   By: Drusilla Kanner M.D.   On: 11/16/2016 20:12   Dg Chest Port 1 View  Result Date: 11/16/2016 CLINICAL DATA:  Shortness of Breath EXAM: PORTABLE CHEST 1 VIEW COMPARISON:  11/11/2016 FINDINGS: Cardiac shadow is within normal limits. Cavitary nodular densities are noted throughout both lungs similar to that seen on previous CT examination. A new right-sided moderately large pneumothorax is noted with consolidation of the lower lobe on the right. No acute bony abnormality is noted. IMPRESSION: Moderately large right-sided pneumothorax new from the prior exam. Near complete collapse of the right lower lobe is noted. Stable cavitary lesions within both lungs. Critical Value/emergent results were called by telephone at the time of interpretation on 11/16/2016 at 6:24 pm to Dr  Leitha Bleak, who verbally acknowledged these results. Electronically Signed   By: Alcide Clever M.D.   On: 11/16/2016 18:27   Dg Chest Port 1 View  Result Date: 11/11/2016 CLINICAL DATA:  Check right IJ line placement.  Initial encounter. EXAM: PORTABLE CHEST 1 VIEW COMPARISON:  Chest radiograph performed 11/10/2016 FINDINGS: The right IJ line is noted ending at  the right atrium. This could be retracted 2-3 cm. Mild vascular congestion is noted. Mild left-sided opacities may reflect atelectasis or mild pneumonia. No pleural effusion or pneumothorax is seen. The cardiomediastinal silhouette is borderline normal in size. No acute osseous abnormalities are identified. IMPRESSION: 1. Right IJ line noted ending at the right atrium. This could be retracted 2-3 cm, as deemed clinically appropriate. 2. Mild vascular congestion noted. 3. Mild left-sided airspace opacities may reflect atelectasis or mild pneumonia. Electronically Signed   By: Roanna Raider M.D.   On: 11/11/2016 00:37   Dg Chest Port 1 View  Result Date: 11/10/2016 CLINICAL DATA:  Retraction of the IJ line about 10 cm EXAM: PORTABLE CHEST 1 VIEW COMPARISON:  CT chest 11/10/2016.  Chest 11/10/2016. FINDINGS: The left central venous catheter tip localizes to the mid/distal right atrial region. If placement at or above the cavoatrial junction is desired, retraction of about 3-4 cm is recommended. No pneumothorax. Shallow inspiration with atelectasis in the lung bases. Borderline heart size. Patchy airspace infiltrates in both lungs could indicate edema, aspiration, or pneumonia. No blunting of costophrenic angles. IMPRESSION: Central venous catheter tip is in the mid/ low right atrial region. If placement at or above the cavoatrial junction is desired, retraction of about 3-4 cm is recommended. Electronically Signed   By: Burman Nieves M.D.   On: 11/10/2016 21:35   Dg Chest Portable 1 View  Result Date: 11/10/2016 CLINICAL DATA:  Status post central line placement. EXAM: PORTABLE CHEST 1 VIEW COMPARISON:  Chest x-ray from earlier same day. FINDINGS: Interval placement of a right IJ central line with tip passing to the left of midline and likely in the right ventricle or main pulmonary artery. The bilateral small nodular airspace opacities throughout both lungs are unchanged in the short-term interval. Suspect  small bilateral pleural effusions. No pneumothorax seen. IMPRESSION: 1. Right IJ central line placement with tip positioned to the left of midline either within the right ventricle or main pulmonary artery. Recommend retracting at least 10 cm. 2. Bilateral small nodular airspace opacities throughout both lungs are unchanged in the short-term interval. As recommended on earlier chest x-ray report, would consider chest CT for further characterization. 3. Suspect small bilateral pleural effusions. Electronically Signed   By: Bary Richard M.D.   On: 11/10/2016 18:37    Lab Data:  CBC:  Recent Labs Lab 11/19/16 0414 11/20/16 0239 11/21/16 0256 11/22/16 0231 11/24/16 0606  WBC 8.7 9.4 8.8 7.4 5.4  HGB 7.7* 7.1* 8.2* 8.5* 7.5*  HCT 24.8* 22.6* 25.7* 26.3* 23.8*  MCV 86.7 85.9 85.4 85.7 86.5  PLT 335 398 411* 407* 360   Basic Metabolic Panel:  Recent Labs Lab 11/18/16 0214 11/19/16 0414 11/19/16 1525 11/19/16 1828 11/20/16 0239 11/21/16 0256 11/22/16 0231 11/23/16 0254 11/24/16 0606  NA 138 139 135  --  137 136 136  --  139  K 4.1 3.9 5.5* 3.9 4.0 3.7 3.7  --  3.4*  CL 109 109 110  --  109 106 105  --  107  CO2 21* 23 18*  --  22 22 22   --  24  GLUCOSE 97 97 85  --  81 93 88  --  89  BUN 14 15 14   --  10 9 9   --  7  CREATININE 0.99 1.37* 1.08*  --  0.93 0.95 0.84 0.78 0.79  CALCIUM 7.5* 7.9* 7.7*  --  7.9* 7.9* 8.0*  --  7.9*  MG 1.7 2.2  --   --  1.7 1.7  --   --   --   PHOS 4.9* 6.0*  --   --  4.9* 4.8*  --   --   --    GFR: Estimated Creatinine Clearance: 88.2 mL/min (by C-G formula based on SCr of 0.79 mg/dL). Liver Function Tests:  Recent Labs Lab 11/21/16 0256  AST 13*  ALT 8*  ALKPHOS 151*  BILITOT 0.4  PROT 5.6*  ALBUMIN 1.5*   No results for input(s): LIPASE, AMYLASE in the last 168 hours. No results for input(s): AMMONIA in the last 168 hours. Coagulation Profile: No results for input(s): INR, PROTIME in the last 168 hours. Cardiac Enzymes:  Recent  Labs Lab 11/18/16 0214  CKTOTAL 30*   BNP (last 3 results) No results for input(s): PROBNP in the last 8760 hours. HbA1C: No results for input(s): HGBA1C in the last 72 hours. CBG:  Recent Labs Lab 11/19/16 1223  GLUCAP 83   Lipid Profile: No results for input(s): CHOL, HDL, LDLCALC, TRIG, CHOLHDL, LDLDIRECT in the last 72 hours. Thyroid Function Tests: No results for input(s): TSH, T4TOTAL, FREET4, T3FREE, THYROIDAB in the last 72 hours. Anemia Panel: No results for input(s): VITAMINB12, FOLATE, FERRITIN, TIBC, IRON, RETICCTPCT in the last 72 hours. Urine analysis:    Component Value Date/Time   COLORURINE YELLOW 11/10/2016 2132   APPEARANCEUR CLEAR 11/10/2016 2132   LABSPEC 1.006 11/10/2016 2132   PHURINE 5.0 11/10/2016 2132   GLUCOSEU NEGATIVE 11/10/2016 2132   HGBUR MODERATE (A) 11/10/2016 2132   BILIRUBINUR NEGATIVE 11/10/2016 2132   KETONESUR NEGATIVE 11/10/2016 2132   PROTEINUR NEGATIVE 11/10/2016 2132   UROBILINOGEN 0.2 08/05/2010 1821   NITRITE NEGATIVE 11/10/2016 2132   LEUKOCYTESUR SMALL (A) 11/10/2016 2132     Ripudeep Rai M.D. [facility] 11/24/2016, 1:03 PM  Pager: [phone] Between 7am to 7pm - call Pager - [phone]  After 7pm go to [email or web address] - password [email or web address]  Call night coverage person covering after 7pm            [facility]                                                                              Patient Demographics  Savannah Palmer, is a 26 y.o. female, DOB - [birth year], GEX:[id number]  Admit date - 11/10/2016   Admitting Physician Coralyn HellingVineet Sood, MD  Outpatient Primary MD for the patient is Patient, No Pcp Per  Outpatient specialists:   LOS - 14  days   Medical records reviewed and are as summarized below:    No chief complaint on file.      Brief summary    26 year old Caucasian female with a past medical history of anxiety, depression, heroin abuse, presented with weakness, cough and chest pain.  Initially was in septic shock and required  pressors. Blood cultures grew MRSA. 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 She was placed on IV antibiotics. She also developed a spontaneous right-sided pneumothorax. Chest tube was placed by critical care medicine. She was transferred to [facility] for opinion from cardiothoracic surgery. Patient is not a candidate for surgery at this time.   Assessment & Plan    Principal Problem: MRSA bacteremia/Septic shock with right TV endocarditis and septic emboli to lungs/MRSA empyema - Patient presented with weakness, coughing and chest pain and septic shock requiring vasopressors. Blood cultures 5/27 grew out MRSA and was found to have septic emboli throughout her lungs.  -Repeat cultures were still positive, blood cultures repeat on 6/4 has been negative to date  - 2-D echo on 5/27 showed tricuspid vegetations with moderate TR. TEE on 6/1 showed large tricuspid vegetation, severe TR with dilated RV along with a loculated left effusion -HIV, Hep C negative. Due to family request, RPR was done and it was negative. - Due to persistent bacteremia patient was started on daptomycin 6/2 and Ceftaroline continued to cover lung infection. - cardiothoracic surgery was also consulted, not a candidate for surgery at this time.  - Per ID recommendations today, DC Ceftaroline and daptomycin, use vancomycin alone, duration through July 15, will need BMP, CBC weekly, trough goal of 15-20. Place PICC line today  Active problems: Right spontaneous pneumothorax- acute respiratory failure/MRSA Empyema -Right empyema with pneumothorax, positive for MRSA. Status post chest tube. - Chest tube removed on 6/7, chest x-ray clear   Acute kidney injury -Baseline creatinine 0.7, creatinine increased to 1.37, likely secondary to dehydration and NSAIDs. - patient received IV fluids, NSAIDs were  discontinued, and creatinine function has normalized.   RUQ pain, likely musculoskeletal -CT abdomen and pelvis showed no liver or gallbladder abnormalities, possible musculoskeletal origin, currently stable.    Ascites, anasarca due to right sided hear failure - as a result of severe TR, net positive balance of 13.7 L - limit IVF, hold off on administering diuretics in setting of sepsis and borderline BPs, O2 sats 96% on room air  Splenomegaly - etiology undetermined- CT on 5/30 revealed that the spleen is enlarged measuring 15.7 x 5.6 x 17 cm (volume= 780 cm^3) - liver is unremarkable on CT  Normocytic Anemia - H&H currently stable, was transfused on 6/5, no overt bleeding noted - Iron was 12. Ferritin 213. B-12 1850. Folate 7.8.  Hypokalemia/Hypophosphatemia/Hypomagnesemia - Resolved  Thrombocytopenia - Resolved  Heroin abuse - cont Suboxone- will need transition to a drug rehab program once stable.   Anxiety - cont Buspar, Neurontin, Xanax  Nicotine dependence -Continue  Nicoderm patch   Code Status: Full CODE STATUS  DVT Prophylaxis:  heparin  Family Communication: Discussed in detail with the patient, all imaging results, lab results explained to the patient and patient's mother in detail at the bedside   Disposition Plan: Transfer to telemetry floor, will need skilled nursing facility  Time Spent in minutes  25 minutes  Procedures:  TEE  - Left ventricle: The cavity size was normal. Wall thickness was normal. Systolic function was normal. The estimated ejection fraction was in the range of 55% to 60%. Wall motion was normal; there were no regional wall motion abnormalities. - Left atrium: No evidence of thrombus in the atrial cavity or appendage. - Right ventricle: The cavity size was mildly dilated. Wall thickness was normal. - Right atrium: No evidence of  thrombus in the atrial cavity or appendage. - Tricuspid valve: Flail motion  of the posterior leaflet. There is a 8x5 mm vegetation attached to the posterior leaflet, but most of the &quot;mass&quot; described on transthoracic imaging appears to be the flail segment of the valve. There was severe regurgitation directed eccentrically and toward the free wall.  Right chest tube placement for pneumothorax 6/1  Placement of a larger bore chest tube in the right chest 6/3  Consultants:   Infectious disease Cardiothoracic surgery  Antimicrobials:   Daptomycin 6/2> 6/8  Teflaro 5/31> 6/8  Vancomycin 6/8>> will need to July 15   Medications  Scheduled Meds: . acetaminophen  1,000 mg Oral Q6H  . buprenorphine-naloxone  2 tablet Sublingual Daily  . busPIRone  10 mg Oral BID  . citalopram  10 mg Oral Daily  . gabapentin  300 mg Oral TID  . heparin  5,000 Units Subcutaneous Q8H  . nicotine  14 mg Transdermal Daily  . saccharomyces boulardii  250 mg Oral BID   Continuous Infusions: . sodium chloride 250 mL (11/19/16 0700)  . vancomycin Stopped (11/24/16 0957)   PRN Meds:.sodium chloride, albuterol, ALPRAZolam, docusate, nicotine polacrilex, ondansetron (ZOFRAN) IV, oxyCODONE   Antibiotics   Anti-infectives    Start     Dose/Rate Route Frequency Ordered Stop   11/24/16 0000  vancomycin (VANCOCIN) IVPB 1000 mg/200 mL premix     1,000 mg 200 mL/hr over 60 Minutes Intravenous Every 8 hours 11/23/16 1238     11/23/16 1600  vancomycin (VANCOCIN) 1,500 mg in sodium chloride 0.9 % 500 mL IVPB     1,500 mg 250 mL/hr over 120 Minutes Intravenous  Once 11/23/16 1238 11/23/16 1733   11/17/16 1600  DAPTOmycin (CUBICIN) 500 mg in sodium chloride 0.9 % IVPB  Status:  Discontinued     500 mg 220 mL/hr over 30 Minutes Intravenous Every 24 hours 11/17/16 1432 11/23/16 1238   11/17/16 1500  ceftaroline (TEFLARO) 600 mg in sodium chloride 0.9 % 250 mL IVPB  Status:  Discontinued     600 mg 250 mL/hr over 60 Minutes Intravenous Every 12 hours 11/17/16 1435  11/23/16 1238   11/15/16 1800  ceftaroline (TEFLARO) 600 mg in sodium chloride 0.9 % 250 mL IVPB  Status:  Discontinued     600 mg 250 mL/hr over 60 Minutes Intravenous Every 12 hours 11/15/16 1635 11/17/16 1431   11/13/16 1215  vancomycin (VANCOCIN) IVPB 1000 mg/200 mL premix  Status:  Discontinued     1,000 mg 200 mL/hr over 60 Minutes Intravenous Every 8 hours 11/13/16 1203 11/16/16 0850   11/11/16 1100  vancomycin (VANCOCIN) IVPB 750 mg/150 ml premix  Status:  Discontinued     750 mg 150 mL/hr over 60 Minutes Intravenous Every 8 hours 11/11/16 0956 11/13/16 1203   11/11/16 0400  vancomycin (VANCOCIN) 500 mg in sodium chloride 0.9 % 100 mL IVPB  Status:  Discontinued     500 mg 100 mL/hr over 60 Minutes Intravenous Every 12 hours 11/10/16 1951 11/11/16 0956   11/10/16 2200  ceFEPIme (MAXIPIME) 1 g in dextrose 5 % 50 mL IVPB  Status:  Discontinued     1 g 100 mL/hr over 30 Minutes Intravenous Every 12 hours 11/10/16 1951 11/11/16 0926   11/10/16 2000  ceFEPIme (MAXIPIME) 2 g in dextrose 5 % 50 mL IVPB  Status:  Discontinued     2 g 100 mL/hr over 30 Minutes Intravenous  Once 11/10/16 1952 11/10/16 1954   11/10/16  2000  vancomycin (VANCOCIN) IVPB 1000 mg/200 mL premix  Status:  Discontinued     1,000 mg 200 mL/hr over 60 Minutes Intravenous  Once 11/10/16 1952 11/10/16 1954   11/10/16 1430  piperacillin-tazobactam (ZOSYN) IVPB 3.375 g     3.375 g 100 mL/hr over 30 Minutes Intravenous  Once 11/10/16 1426 11/10/16 1503   11/10/16 1430  vancomycin (VANCOCIN) IVPB 1000 mg/200 mL premix     1,000 mg 200 mL/hr over 60 Minutes Intravenous  Once 11/10/16 1426 11/10/16 1653        Subjective:   Savannah Palmer was seen and examined today.No fevers or chills, denies any specific complaints. Patient denies dizziness,  shortness of breath, abdominal pain, N/V/D/C, new weakness, numbess, tingling. No acute events overnight.    Objective:   Vitals:   11/23/16 1428 11/23/16 2055 11/24/16  0500 11/24/16 0618  BP: 118/76 121/77  119/79  Pulse: 84 86  71  Resp: 18 18  18   Temp: 98.6 F (37 C) 98.4 F (36.9 C)  98.5 F (36.9 C)  TempSrc:    Oral  SpO2: 96% 97%  96%  Weight:   59.4 kg (131 lb)   Height:        Intake/Output Summary (Last 24 hours) at 11/24/16 1303 Last data filed at 11/24/16 1610  Gross per 24 hour  Intake             1534 ml  Output                0 ml  Net             1534 ml     Wt Readings from Last 3 Encounters:  11/24/16 59.4 kg (131 lb)  03/14/16 58.5 kg (129 lb)  01/19/16 58.1 kg (128 lb)     Exam  General: Alert and oriented x 3, NAD  Eyes: PERRLA, EOMI  HEENT:  Atraumatic, normocephalic, normal oropharynx  Cardiovascular: S1 S2 clear, systolic murmur in tricuspid area, min pedal edema   Respiratory: CTAB  Gastrointestinal: SoftNT, ND, NBS  Ext: mild  pedal edema bilaterally  Neuro: no focal neurological deficits  Musculoskeletal: No digital cyanosis, clubbing  Skin: No rashes  Psych: alert and oriented 3, normal affect   Data Reviewed:  I have personally reviewed following labs and imaging studies  Micro Results Recent Results (from the past 240 hour(s))  Culture, blood (Routine X 2) w Reflex to ID Panel     Status: Abnormal   Collection Time: 11/14/16  4:10 PM  Result Value Ref Range Status   Specimen Description BLOOD RIGHT ARM  Final   Special Requests IN PEDIATRIC BOTTLE Blood Culture adequate volume  Final   Culture  Setup Time   Final    IN PEDIATRIC BOTTLE GRAM POSITIVE COCCI IN CLUSTERS CRITICAL RESULT CALLED TO, READ BACK BY AND VERIFIED WITH: PHARMD A PHAM [id number] MLM    Culture (A)  Final    STAPHYLOCOCCUS AUREUS SUSCEPTIBILITIES PERFORMED ON PREVIOUS CULTURE WITHIN THE LAST 5 DAYS. Performed at [facility], [address] [place], [place] [address]    Report Status 11/17/2016 FINAL  Final  Culture, blood (Routine X 2) w Reflex to ID Panel     Status: None   Collection Time:  11/14/16  4:10 PM  Result Value Ref Range Status   Specimen Description BLOOD RIGHT ARM  Final   Special Requests   Final    BOTTLES DRAWN AEROBIC ONLY Blood  Culture adequate volume   Culture   Final    NO GROWTH 5 DAYS Performed at [facility], [address] [place], [place] [address]    Report Status 11/19/2016 FINAL  Final  MRSA PCR Screening     Status: None   Collection Time: 11/16/16  5:49 PM  Result Value Ref Range Status   MRSA by PCR NEGATIVE NEGATIVE Final    Comment:        The GeneXpert MRSA Assay (FDA approved for NASAL specimens only), is one component of a comprehensive MRSA colonization surveillance program. It is not intended to diagnose MRSA infection nor to guide or monitor treatment for MRSA infections. DELTA CHECK NOTED   Body fluid culture (includes gram stain)     Status: None   Collection Time: 11/16/16  8:06 PM  Result Value Ref Range Status   Specimen Description PLEURAL FLUID  Final   Special Requests NONE  Final   Gram Stain   Final    FEW WBC PRESENT, PREDOMINANTLY PMN NO ORGANISMS SEEN    Culture   Final    FEW METHICILLIN RESISTANT STAPHYLOCOCCUS AUREUS RESULT CALLED TO, READ BACK BY AND VERIFIED WITH: R. MYRICK RN, AT 1023 11/18/16 BY D. VANHOOK REGARDING CULTURE GROWTH INTERPRET RESULTS WITH CAUTION DUE TO LIMITED SPECIMEN VOLUME Performed at [facility], [address], [place], [place] [address]    Report Status 11/20/2016 FINAL  Final   Organism ID, Bacteria METHICILLIN RESISTANT STAPHYLOCOCCUS AUREUS  Final      Susceptibility   Methicillin resistant staphylococcus aureus - MIC*    CIPROFLOXACIN >=8 RESISTANT Resistant     ERYTHROMYCIN >=8 RESISTANT Resistant     GENTAMICIN <=0.5 SENSITIVE Sensitive     OXACILLIN >=4 RESISTANT Resistant     TETRACYCLINE <=1 SENSITIVE Sensitive     VANCOMYCIN 1 SENSITIVE Sensitive     TRIMETH/SULFA <=10 SENSITIVE Sensitive     CLINDAMYCIN <=0.25 SENSITIVE Sensitive     RIFAMPIN  <=0.5 SENSITIVE Sensitive     Inducible Clindamycin NEGATIVE Sensitive     * FEW METHICILLIN RESISTANT STAPHYLOCOCCUS AUREUS  Culture, blood (routine x 2)     Status: None (Preliminary result)   Collection Time: 11/19/16  8:57 AM  Result Value Ref Range Status   Specimen Description BLOOD RIGHT ARM  Final   Special Requests IN PEDIATRIC BOTTLE Blood Culture adequate volume  Final   Culture NO GROWTH 4 DAYS  Final   Report Status PENDING  Incomplete  Culture, blood (routine x 2)     Status: None (Preliminary result)   Collection Time: 11/19/16  9:05 AM  Result Value Ref Range Status   Specimen Description BLOOD RIGHT ANTECUBITAL  Final   Special Requests IN PEDIATRIC BOTTLE Blood Culture adequate volume  Final   Culture NO GROWTH 4 DAYS  Final   Report Status PENDING  Incomplete    Radiology Reports Dg Chest 2 View  Result Date: 11/23/2016 CLINICAL DATA:  Chest tube removal EXAM: CHEST  2 VIEW COMPARISON:  11/21/2016 FINDINGS: Interval removal of right chest tube with residual small right pleural effusion versus pleural thickening. No pneumothorax. Patchy bilateral airspace opacities are again noted, unchanged. Heart is borderline in size. IMPRESSION: Interval removal of right chest tube without pneumothorax. Otherwise no change. Electronically Signed   By: Charlett Nose M.D.   On: 11/23/2016 08:44   Dg Chest 2 View  Result Date: 11/21/2016 CLINICAL DATA:  Chest soreness.  Chest tube . EXAM:  CHEST  2 VIEW COMPARISON:  CT 11/20/2016.  Chest x-ray 11/20/2016 FINDINGS: Chest tube noted on the right. Tiny right anterior hydropneumothorax again noted. No interim change . Stable cardiomegaly. Persistent bilateral pulmonary nodules including cavitary nodules. Reference is made to prior CT report. Small right pleural effusion. No pneumothorax. IMPRESSION: 1. Right chest tube in stable position. Tiny right anterior hydropneumothorax again noted. No interim change. 2. Multiple bilateral pulmonary nodules  with cavitation. Reference is made to prior CT report of 11/20/2016 . Electronically Signed   By: Maisie Fus  Register   On: 11/21/2016 11:15   Dg Chest 2 View  Result Date: 11/19/2016 CLINICAL DATA:  Pneumothorax. EXAM: CHEST  2 VIEW COMPARISON:  11/19/2016. FINDINGS: Right chest tube in stable position. No significant pneumothorax noted on today's exam. Small right pleural effusion. Bilateral patchy pulmonary infiltrates with cavitation again noted. Small left pleural effusion noted. Heart size stable. No acute bony abnormality . IMPRESSION: 1. Right chest tube in stable position. No pneumothorax noted on today's exam. Small right pleural effusion. 2. Persistent bilateral cavitary pulmonary infiltrates. Small left pleural effusion. Electronically Signed   By: Maisie Fus  Register   On: 11/19/2016 16:53   Dg Chest 2 View  Result Date: 11/10/2016 CLINICAL DATA:  Chest pain, shortness of breath for 1 week EXAM: CHEST  2 VIEW COMPARISON:  CT chest 03/12/2016, chest x-ray 03/12/2016 FINDINGS: There are bilateral nodular airspace opacities throughout the upper and lower lobes bilaterally. There is no pleural effusion or pneumothorax. The heart and mediastinal contours are unremarkable. The osseous structures are unremarkable. IMPRESSION: New bilateral nodular airspace opacities throughout the upper and lower lobes bilaterally. Findings are concerning for multifocal infection such as septic emboli. Recommend further evaluation with a CT of the chest with intravenous contrast. Electronically Signed   By: Elige Ko   On: 11/10/2016 13:23   Ct Chest Wo Contrast  Result Date: 11/20/2016 CLINICAL DATA:  Follow-up right pleural effusion EXAM: CT CHEST WITHOUT CONTRAST TECHNIQUE: Multidetector CT imaging of the chest was performed following the standard protocol without IV contrast. COMPARISON:  Radiograph 11/20/2016, CT chest 11/10/2016, prior radiographs dating back to 11/10/2016 FINDINGS: Cardiovascular: Limited  assessment without intravenous contrast. Small fluid in the superior pericardial recess. There is cardiomegaly. Mediastinum/Nodes: Difficult evaluation without contrast. Mediastinal adenopathy is present right paratracheal soft tissue fullness, possible adenopathy does not appear significantly changed. Midline trachea. Esophagus grossly unremarkable. Lungs/Pleura: Interim placement of a right lower lateral chest tube since the prior CT with the tip terminating along the right upper posterior pleural surface. Small bilateral pleural effusions right greater than left, slightly increased compared to the previous CT. Tiny right anterior hydropneumothorax. Multiple bilateral pulmonary nodules and cavitary lesions. Some of the cavitary lesions have decreased, however if there appears to be increased nodules/disease most notable in the left upper lobe and the right lower lobe. Decreased septal thickening compared to the previous exam. Upper Abdomen: Spleen appears enlarged. Partially visualized surgical clips in the gallbladder fossa. Musculoskeletal: No acute or suspicious bone lesion. IMPRESSION: 1. Interim placement of right-sided chest tube. Small residual pleural effusions right greater than left, mildly increased compared to the prior chest CT. Small right anterior hydropneumothorax. 2. Innumerable bilateral lung nodules and cavitary lesions, suspected to represent septic emboli. This appears increased in the left upper and right lower lobe since the prior CT. 3. Suspect mediastinal adenopathy although evaluation of the mediastinum is limited without contrast 4. Splenomegaly Electronically Signed   By: Jasmine Pang M.D.   On:  11/20/2016 21:25   Ct Chest Wo Contrast  Result Date: 11/10/2016 CLINICAL DATA:  She c/o some chest discomfrot and shortness of breath x 1 week. She states these are similar s/s she had a year ago, at which time she was dx with "pulmonary abscess". She is in no distress. IV drug user EXAM:  CT CHEST WITHOUT CONTRAST TECHNIQUE: Multidetector CT imaging of the chest was performed following the standard protocol without IV contrast. COMPARISON:  Chest CT angiogram dated 03/12/2016. FINDINGS: Cardiovascular: Probable small pericardial effusion and/or pericardial thickening, difficult to characterize without IV contrast. Heart size is within normal limits. Thoracic aorta appears to be normal in caliber. Mediastinum/Nodes: Soft tissue density material within the right upper peritracheal space, also difficult to characterize without IV contrast, presumed lymphadenopathy. Additional mild lymphadenopathy within the anterior mediastinum and aortopulmonary window region. Lungs/Pleura: Numerous cavitary consolidations throughout both lungs, peripherally distributed suggesting septic emboli. More confluent consolidations at each lung base, more likely atelectasis than pneumonia. Small right pleural effusion. Upper Abdomen: Suspected splenomegaly, incompletely imaged. Musculoskeletal: No acute or suspicious osseous finding. IMPRESSION: 1. Numerous cavitary consolidations throughout both lungs, with a peripheral distribution suggesting septic emboli. Largest cavitary consolidation is within the left lower lobe measuring approximately 4 cm greatest dimension. 2. Probable small pericardial effusion and/or pericardial wall thickening, difficult to delineate without IV contrast. 3. Probable mediastinal lymphadenopathy, again difficult to definitively characterize without IV contrast, alternatively confluent fluid or edema within the mediastinum. 4. Small right pleural effusion. 5. Probable splenomegaly, incompletely imaged at the lower aspects of this chest CT. 6. Right IJ line in place with tip passing through the right atrium and into the right ventricle. Recommend retracting to the cavoatrial junction for optimal radiographic positioning. These results and recommendations were called by telephone at the time of  interpretation on 11/10/2016 at 7:35 pm to Dr. Katrinka Blazing , who verbally acknowledged these results. Electronically Signed   By: Bary Richard M.D.   On: 11/10/2016 19:36   Ct Abdomen Pelvis W Contrast  Result Date: 11/14/2016 CLINICAL DATA:  Right upper quadrant pain times 2-3 days. Polysubstance abuse. Cholecystectomy. History of endocarditis and septic emboli. EXAM: CT ABDOMEN AND PELVIS WITH CONTRAST TECHNIQUE: Multidetector CT imaging of the abdomen and pelvis was performed using the standard protocol following bolus administration of intravenous contrast. CONTRAST:  80mL ISOVUE-300 IOPAMIDOL (ISOVUE-300) INJECTION 61% COMPARISON:  03/12/2016 chest CT FINDINGS: Lower chest: Small right effusion with several bilateral cavitary opacities in both lower lobes concerning for septic emboli or cavitary pneumonia. More confluent airspace opacity in the left lower lobe is also noted. The visualized heart is normal in size. There is no pericardial effusion. No large central pulmonary embolus. Hepatobiliary: Normal appearance of the liver without space-occupying mass or biliary dilatation. Cholecystectomy. Pancreas: No pancreatic mass or ductal dilatation. Mild enlargement of the pancreatic head with surrounding peripancreatic fluid is noted. Correlate to exclude a pancreatitis. Spleen: The spleen is enlarged measuring 15.7 x 5.6 x 17 cm (volume = 780 cm^3) and demonstrates a developmental cleft, partially visualized on prior chest CT and therefore believed less likely to represent a laceration. Adrenals/Urinary Tract: Adrenal glands are unremarkable. Kidneys are normal, without renal calculi, solid appearing lesions, or hydronephrosis. There appear to be pair of water attenuating cysts in the lower pole the right kidney measuring up to 2 x 1.3 cm in aggregate. Bladder is unremarkable. Stomach/Bowel: Stomach is within normal limits. Appendix appears normal. No evidence of bowel wall thickening, distention, or inflammatory  changes. Vascular/Lymphatic: No  significant vascular findings are present. No enlarged abdominal or pelvic lymph nodes. Reproductive: Uterus and bilateral adnexa are unremarkable. Other: Moderate volume of ascites.  Anasarca. Musculoskeletal: No acute or significant osseous findings. IMPRESSION: 1. Bibasilar, multifocal cavitary lesions noted within the visualized lower lobes consistent with septic emboli or possibly cavitary pneumonia. More confluent airspace disease in the left lower lobe without cavitation is also present. There is a small right pleural effusion. 2. Anasarca with moderate volume of ascites noted within the abdomen and pelvis. Question right heart dysfunction/failure. 3. Splenomegaly. 4. Water attenuating cysts in the lower pole the right kidney in aggregate measuring 2 x 1.3 cm. 5. Status post cholecystectomy. Electronically Signed   By: Tollie Eth M.D.   On: 11/14/2016 16:26   Dg Chest 1v Repeat Same Day  Result Date: 11/18/2016 CLINICAL DATA:  Chest tube placement EXAM: CHEST - 1 VIEW SAME DAY COMPARISON:  Earlier same day FINDINGS: Large bore chest tube placed on the right along the lateral margin. Marked reduction an amount of right pleural air, nearly completely evacuated. Patchy infiltrates persist in both lower lobes. IMPRESSION: Large bore chest tube placed. Near complete resolution of right pneumothorax. Electronically Signed   By: Paulina Fusi M.D.   On: 11/18/2016 11:40   Dg Chest Port 1 View  Result Date: 11/20/2016 CLINICAL DATA:  26 year old female with pneumothorax. Recent transesophageal echocardiogram. Subsequent encounter. EXAM: PORTABLE CHEST 1 VIEW COMPARISON:  11/20/2016 8:30 a.m. FINDINGS: Right-sided chest tube remains in place. No pneumothorax detected on this frontal projection. Cavitary lesions bilaterally. Left base consolidation. Atelectasis/pleural thickening right lung base. Cardiomegaly. IMPRESSION: No significant change since exam earlier today.  Electronically Signed   By: Lacy Duverney M.D.   On: 11/20/2016 13:55   Dg Chest Port 1 View  Result Date: 11/20/2016 CLINICAL DATA:  Chest tube, shortness of Breath EXAM: PORTABLE CHEST 1 VIEW COMPARISON:  11/19/2016 FINDINGS: Right chest tube remains in place, unchanged. No pneumothorax. Patchy bilateral airspace opacities are again noted, some with cavitation. Small right pleural effusion. Heart is borderline in size. IMPRESSION: Right chest tube remains in place without pneumothorax. Stable patchy bilateral airspace disease, some of which is cavitary. Electronically Signed   By: Charlett Nose M.D.   On: 11/20/2016 08:42   Dg Chest Port 1 View  Result Date: 11/19/2016 CLINICAL DATA:  26 year old female with cavitary lung lesions possibly from septic endocarditis. Subsequent encounter. EXAM: PORTABLE CHEST 1 VIEW COMPARISON:  11/18/2016. FINDINGS: Right-sided chest tube remains in place. Tiny hydropneumothorax versus fluid within minor fissure noted. Multiple cavitary lesions. Right-sided pleural effusion. Basilar atelectasis versus infiltrate greater on the left. Cardiomegaly. Mild scoliosis. IMPRESSION: Right-sided chest tube remains in place. Tiny hydropneumothorax versus fluid within minor fissure. Multiple cavitary lesions. Right-sided pleural effusion. Basilar atelectasis versus infiltrate greater on the left. Electronically Signed   By: Lacy Duverney M.D.   On: 11/19/2016 07:16   Dg Chest Port 1 View  Result Date: 11/18/2016 CLINICAL DATA:  Followup left pneumothorax. Patient with bacterial endocarditis. EXAM: PORTABLE CHEST 1 VIEW COMPARISON:  11/17/2016 and prior studies FINDINGS: A moderate right hydropneumothorax is unchanged in size, but now with pleural fluid in its basilar portion. A right thoracostomy tube is unchanged. Scattered pulmonary opacities/ nodules and left lower lung consolidations/atelectasis again noted. There has been no other interval change. IMPRESSION: Moderate right  hydropneumothorax, unchanged in size but now with pleural fluid in its basilar portion. Right thoracostomy tube remains unchanged. Electronically Signed   By: Henrietta Hoover.D.  On: 11/18/2016 10:14   Dg Chest Port 1 View  Result Date: 11/17/2016 CLINICAL DATA:  Follow-up right-sided chest tube placement. Initial encounter. EXAM: PORTABLE CHEST 1 VIEW COMPARISON:  Chest radiograph performed 11/16/2016 FINDINGS: The patient's moderate right-sided pneumothorax has decreased in size. The right-sided chest tube is grossly unchanged in appearance. Vascular congestion is noted. Patchy left-sided airspace opacities raise concern for pneumonia. Right-sided airspace opacity may reflect atelectasis. A small left pleural effusion is noted. The cardiomediastinal silhouette is mildly enlarged. No acute osseous abnormalities are identified. IMPRESSION: 1. Moderate right-sided pneumothorax has decreased in size. Right-sided chest tube is grossly unchanged in appearance. 2. Vascular congestion and mild cardiomegaly. Patchy left-sided airspace opacities raise concern for pneumonia. Small left pleural effusion noted. 3. Right-sided airspace opacity may reflect atelectasis. Electronically Signed   By: Roanna Raider M.D.   On: 11/17/2016 00:33   Dg Chest Port 1 View  Result Date: 11/16/2016 CLINICAL DATA:  Chest tube placement for right pneumothorax. EXAM: PORTABLE CHEST 1 VIEW COMPARISON:  Single-view of the chest earlier today and 11/11/2016. FINDINGS: Pigtail catheter is now in place in the right chest. Large right pneumothorax is unchanged. Patchy airspace disease in cavitary lesions in the left chest persist. Heart size is normal. IMPRESSION: No change in large right pneumothorax after chest tube placement. No change in patchy airspace disease in cavitary lesions on the left. These results were called by telephone at the time of interpretation on 11/16/2016 at 8:10 pm to Estrella Deeds, RN, who verbally acknowledged these  results. Electronically Signed   By: Drusilla Kanner M.D.   On: 11/16/2016 20:12   Dg Chest Port 1 View  Result Date: 11/16/2016 CLINICAL DATA:  Shortness of Breath EXAM: PORTABLE CHEST 1 VIEW COMPARISON:  11/11/2016 FINDINGS: Cardiac shadow is within normal limits. Cavitary nodular densities are noted throughout both lungs similar to that seen on previous CT examination. A new right-sided moderately large pneumothorax is noted with consolidation of the lower lobe on the right. No acute bony abnormality is noted. IMPRESSION: Moderately large right-sided pneumothorax new from the prior exam. Near complete collapse of the right lower lobe is noted. Stable cavitary lesions within both lungs. Critical Value/emergent results were called by telephone at the time of interpretation on 11/16/2016 at 6:24 pm to Dr Leitha Bleak, who verbally acknowledged these results. Electronically Signed   By: Alcide Clever M.D.   On: 11/16/2016 18:27   Dg Chest Port 1 View  Result Date: 11/11/2016 CLINICAL DATA:  Check right IJ line placement.  Initial encounter. EXAM: PORTABLE CHEST 1 VIEW COMPARISON:  Chest radiograph performed 11/10/2016 FINDINGS: The right IJ line is noted ending at the right atrium. This could be retracted 2-3 cm. Mild vascular congestion is noted. Mild left-sided opacities may reflect atelectasis or mild pneumonia. No pleural effusion or pneumothorax is seen. The cardiomediastinal silhouette is borderline normal in size. No acute osseous abnormalities are identified. IMPRESSION: 1. Right IJ line noted ending at the right atrium. This could be retracted 2-3 cm, as deemed clinically appropriate. 2. Mild vascular congestion noted. 3. Mild left-sided airspace opacities may reflect atelectasis or mild pneumonia. Electronically Signed   By: Roanna Raider M.D.   On: 11/11/2016 00:37   Dg Chest Port 1 View  Result Date: 11/10/2016 CLINICAL DATA:  Retraction of the IJ line about 10 cm EXAM: PORTABLE CHEST 1 VIEW  COMPARISON:  CT chest 11/10/2016.  Chest 11/10/2016. FINDINGS: The left central venous catheter tip localizes to the mid/distal right atrial region.  If placement at or above the cavoatrial junction is desired, retraction of about 3-4 cm is recommended. No pneumothorax. Shallow inspiration with atelectasis in the lung bases. Borderline heart size. Patchy airspace infiltrates in both lungs could indicate edema, aspiration, or pneumonia. No blunting of costophrenic angles. IMPRESSION: Central venous catheter tip is in the mid/ low right atrial region. If placement at or above the cavoatrial junction is desired, retraction of about 3-4 cm is recommended. Electronically Signed   By: Burman Nieves M.D.   On: 11/10/2016 21:35   Dg Chest Portable 1 View  Result Date: 11/10/2016 CLINICAL DATA:  Status post central line placement. EXAM: PORTABLE CHEST 1 VIEW COMPARISON:  Chest x-ray from earlier same day. FINDINGS: Interval placement of a right IJ central line with tip passing to the left of midline and likely in the right ventricle or main pulmonary artery. The bilateral small nodular airspace opacities throughout both lungs are unchanged in the short-term interval. Suspect small bilateral pleural effusions. No pneumothorax seen. IMPRESSION: 1. Right IJ central line placement with tip positioned to the left of midline either within the right ventricle or main pulmonary artery. Recommend retracting at least 10 cm. 2. Bilateral small nodular airspace opacities throughout both lungs are unchanged in the short-term interval. As recommended on earlier chest x-ray report, would consider chest CT for further characterization. 3. Suspect small bilateral pleural effusions. Electronically Signed   By: Bary Richard M.D.   On: 11/10/2016 18:37    Lab Data:  CBC:  Recent Labs Lab 11/19/16 0414 11/20/16 0239 11/21/16 0256 11/22/16 0231 11/24/16 0606  WBC 8.7 9.4 8.8 7.4 5.4  HGB 7.7* 7.1* 8.2* 8.5* 7.5*  HCT 24.8*  22.6* 25.7* 26.3* 23.8*  MCV 86.7 85.9 85.4 85.7 86.5  PLT 335 398 411* 407* 360   Basic Metabolic Panel:  Recent Labs Lab 11/18/16 0214 11/19/16 0414 11/19/16 1525 11/19/16 1828 11/20/16 0239 11/21/16 0256 11/22/16 0231 11/23/16 0254 11/24/16 0606  NA 138 139 135  --  137 136 136  --  139  K 4.1 3.9 5.5* 3.9 4.0 3.7 3.7  --  3.4*  CL 109 109 110  --  109 106 105  --  107  CO2 21* 23 18*  --  22 22 22   --  24  GLUCOSE 97 97 85  --  81 93 88  --  89  BUN 14 15 14   --  10 9 9   --  7  CREATININE 0.99 1.37* 1.08*  --  0.93 0.95 0.84 0.78 0.79  CALCIUM 7.5* 7.9* 7.7*  --  7.9* 7.9* 8.0*  --  7.9*  MG 1.7 2.2  --   --  1.7 1.7  --   --   --   PHOS 4.9* 6.0*  --   --  4.9* 4.8*  --   --   --    GFR: Estimated Creatinine Clearance: 88.2 mL/min (by C-G formula based on SCr of 0.79 mg/dL). Liver Function Tests:  Recent Labs Lab 11/21/16 0256  AST 13*  ALT 8*  ALKPHOS 151*  BILITOT 0.4  PROT 5.6*  ALBUMIN 1.5*   No results for input(s): LIPASE, AMYLASE in the last 168 hours. No results for input(s): AMMONIA in the last 168 hours. Coagulation Profile: No results for input(s): INR, PROTIME in the last 168 hours. Cardiac Enzymes:  Recent Labs Lab 11/18/16 0214  CKTOTAL 30*   BNP (last 3 results) No results for input(s): PROBNP in the last 8760  hours. HbA1C: No results for input(s): HGBA1C in the last 72 hours. CBG:  Recent Labs Lab 11/19/16 1223  GLUCAP 83   Lipid Profile: No results for input(s): CHOL, HDL, LDLCALC, TRIG, CHOLHDL, LDLDIRECT in the last 72 hours. Thyroid Function Tests: No results for input(s): TSH, T4TOTAL, FREET4, T3FREE, THYROIDAB in the last 72 hours. Anemia Panel: No results for input(s): VITAMINB12, FOLATE, FERRITIN, TIBC, IRON, RETICCTPCT in the last 72 hours. Urine analysis:    Component Value Date/Time   COLORURINE YELLOW 11/10/2016 2132   APPEARANCEUR CLEAR 11/10/2016 2132   LABSPEC 1.006 11/10/2016 2132   PHURINE 5.0 11/10/2016  2132   GLUCOSEU NEGATIVE 11/10/2016 2132   HGBUR MODERATE (A) 11/10/2016 2132   BILIRUBINUR NEGATIVE 11/10/2016 2132   KETONESUR NEGATIVE 11/10/2016 2132   PROTEINUR NEGATIVE 11/10/2016 2132   UROBILINOGEN 0.2 08/05/2010 1821   NITRITE NEGATIVE 11/10/2016 2132   LEUKOCYTESUR SMALL (A) 11/10/2016 2132     Ripudeep Rai M.D. [facility] 11/24/2016, 1:03 PM  Pager: [phone] Between 7am to 7pm - call Pager - [phone]  After 7pm go to [email or web address] - password [email or web address]  Call night coverage person covering after 7pm

## 2016-11-25 LAB — BASIC METABOLIC PANEL
Anion gap: 5 (ref 5–15)
BUN: 10 mg/dL (ref 6–20)
CALCIUM: 8.3 mg/dL — AB (ref 8.9–10.3)
CO2: 26 mmol/L (ref 22–32)
CREATININE: 0.8 mg/dL (ref 0.44–1.00)
Chloride: 107 mmol/L (ref 101–111)
GFR calc non Af Amer: >60 mL/min (ref >60)
GLUCOSE: 97 mg/dL (ref 65–99)
Potassium: 3.8 mmol/L (ref 3.5–5.1)
Sodium: 138 mmol/L (ref 135–145)

## 2016-11-25 LAB — PREPARE RBC (CROSSMATCH)

## 2016-11-25 LAB — CBC
HEMATOCRIT: 22.7 % — AB (ref 36.0–46.0)
HEMOGLOBIN: 7.1 g/dL — AB (ref 12.0–15.0)
MCH: 27.6 pg (ref 26.0–34.0)
MCHC: 31.3 g/dL (ref 30.0–36.0)
MCV: 88.3 fL (ref 78.0–100.0)
Platelets: 332 10*3/uL (ref 150–400)
RBC: 2.57 MIL/uL — ABNORMAL LOW (ref 3.87–5.11)
RDW: 15.6 % — AB (ref 11.5–15.5)
WBC: 5.6 10*3/uL (ref 4.0–10.5)

## 2016-11-25 MED ORDER — SODIUM CHLORIDE 0.9 % IV SOLN
510.0000 mg | Freq: Once | INTRAVENOUS | Status: AC
  Administered 2016-11-25: 510 mg via INTRAVENOUS
  Filled 2016-11-25: qty 17

## 2016-11-25 MED ORDER — IBUPROFEN 400 MG PO TABS
400.0000 mg | ORAL_TABLET | Freq: Four times a day (QID) | ORAL | Status: DC | PRN
  Administered 2016-11-25 – 2016-12-09 (×3): 400 mg via ORAL
  Filled 2016-11-25 (×3): qty 1

## 2016-11-25 MED ORDER — FUROSEMIDE 10 MG/ML IJ SOLN
20.0000 mg | Freq: Once | INTRAMUSCULAR | Status: AC
  Administered 2016-11-25: 20 mg via INTRAVENOUS
  Filled 2016-11-25: qty 2

## 2016-11-25 MED ORDER — DOCUSATE SODIUM 100 MG PO CAPS
100.0000 mg | ORAL_CAPSULE | Freq: Two times a day (BID) | ORAL | Status: DC | PRN
  Administered 2016-11-25 – 2016-12-25 (×5): 100 mg via ORAL
  Filled 2016-11-25 (×6): qty 1

## 2016-11-25 MED ORDER — SODIUM CHLORIDE 0.9 % IV SOLN
Freq: Once | INTRAVENOUS | Status: AC
  Administered 2016-11-25: 15:00:00 via INTRAVENOUS

## 2016-11-25 NOTE — Progress Notes (Signed)
[facility]                                                                              Patient Demographics  Savannah Palmer, is a 26 y.o. female, DOB - 1990-08-19, GNF:[id number]  Admit date - 11/10/2016   Admitting Physician Coralyn Helling, MD  Outpatient Primary MD for the patient is Patient, No Pcp Per  Outpatient specialists:   LOS - 15  days   Medical records reviewed and are as summarized below:    No chief complaint on file.      Brief summary    26 year old Caucasian female with a past medical history of anxiety, depression, heroin abuse, presented with weakness, cough and chest pain. Initially was in septic shock and required pressors. Blood cultures grew MRSA. 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 She was placed on IV antibiotics. She also developed a spontaneous right-sided pneumothorax. Chest tube was placed by critical care medicine. She was transferred to [facility] for opinion from cardiothoracic surgery. Patient is not a candidate for surgery at this time.   Assessment & Plan    Principal Problem: MRSA bacteremia/Septic shock with right TV endocarditis and septic emboli to lungs/MRSA empyema - Patient presented with weakness, coughing and chest pain and septic shock requiring vasopressors. Blood cultures 5/27 grew out MRSA and was found to have septic emboli throughout her lungs.  -Repeat cultures were still positive, blood cultures repeat on 6/4 has been negative to date  - 2-D echo on 5/27 showed tricuspid vegetations with moderate TR. TEE on 6/1 showed large tricuspid vegetation, severe TR with dilated RV along with a loculated left effusion -HIV, Hep C negative. Due to family request, RPR was done and it was negative. - Due to persistent bacteremia patient was started on daptomycin 6/2 and Ceftaroline continued to cover lung infection. - cardiothoracic  surgery was also consulted, not a candidate for surgery at this time.  - Per ID recommendations today, DC Ceftaroline and daptomycin, duration through July 15, will need BMP, CBC weekly, trough goal of 15-20.  - PICC line placed, continue vancomycin, until July 15. Creatinine stable. Monitor Vanc trough per pharmacy -  Pending skilled nursing facility placement   Active problems: Right spontaneous pneumothorax- acute respiratory failure/MRSA Empyema -Right empyema with pneumothorax, positive for MRSA. Status post chest tube. - Chest tube removed on 6/7, chest x-ray clear   Acute kidney injury -Baseline creatinine 0.7, creatinine increased to 1.37, likely secondary to dehydration and NSAIDs. - patient received IV fluids, NSAIDs were discontinued, and creatinine function has normalized.   RUQ pain, likely musculoskeletal -CT abdomen and pelvis showed no liver or gallbladder abnormalities, possible musculoskeletal origin, currently stable.    Ascites, anasarca due to right sided hear failure - as a result of severe TR, net positive balance of 13 - O2 sats 97% on room air  Splenomegaly - etiology undetermined- CT on 5/30 revealed that the spleen is enlarged measuring 15.7 x 5.6 x 17 cm (volume= 780 cm^3) - liver is unremarkable on CT  Normocytic Anemia - H&H  currently stable, was transfused on 6/5, no overt bleeding noted - Iron was 12. Ferritin 213. B-12 1850. Folate 7.8. - Hemoglobin down to 7.1, no overt bleeding, obtain FOBT, transfuse 2 units packed RBCs with Lasix 20 mg IV 1 - Transfuse 1 dose of IV Feraheme  Hypokalemia/Hypophosphatemia/Hypomagnesemia - Resolved  Thrombocytopenia - Resolved  Heroin abuse - cont Suboxone, will need transition to a drug rehab program once stable.   Anxiety - cont Buspar, Neurontin, Xanax  Nicotine dependence -Continue  Nicoderm patch   Code Status: Full CODE STATUS  DVT Prophylaxis:  heparin  Family Communication:  Discussed in detail with the patient, all imaging results, lab results explained to the patient and patient's mother at the bedside today   Disposition Plan: Hopefully DC to skilled nursing facility once bed is available, ?in am  Time Spent in minutes  35 minutes  Procedures:  TEE  - Left ventricle: The cavity size was normal. Wall thickness was normal. Systolic function was normal. The estimated ejection fraction was in the range of 55% to 60%. Wall motion was normal; there were no regional wall motion abnormalities. - Left atrium: No evidence of thrombus in the atrial cavity or appendage. - Right ventricle: The cavity size was mildly dilated. Wall thickness was normal. - Right atrium: No evidence of thrombus in the atrial cavity or appendage. - Tricuspid valve: Flail motion of the posterior leaflet. There is a 8x5 mm vegetation attached to the posterior leaflet, but most of the &quot;mass&quot; described on transthoracic imaging appears to be the flail segment of the valve. There was severe regurgitation directed eccentrically and toward the free wall.  Right chest tube placement for pneumothorax 6/1  Placement of a larger bore chest tube in the right chest 6/3  Consultants:   Infectious disease Cardiothoracic surgery  Antimicrobials:   Daptomycin 6/2> 6/8  Teflaro 5/31> 6/8  Vancomycin 6/8>> will need to July 15   Medications  Scheduled Meds: . acetaminophen  1,000 mg Oral Q6H  . buprenorphine-naloxone  2 tablet Sublingual Daily  . busPIRone  10 mg Oral BID  . citalopram  10 mg Oral Daily  . gabapentin  300 mg Oral TID  . heparin  5,000 Units Subcutaneous Q8H  . nicotine  14 mg Transdermal Daily  . saccharomyces boulardii  250 mg Oral BID   Continuous Infusions: . sodium chloride 250 mL (11/19/16 0700)  . vancomycin Stopped (11/25/16 0837)   PRN Meds:.sodium chloride, albuterol, ALPRAZolam, docusate, nicotine polacrilex, ondansetron  (ZOFRAN) IV, oxyCODONE, sodium chloride flush   Antibiotics   Anti-infectives    Start     Dose/Rate Route Frequency Ordered Stop   11/24/16 0000  vancomycin (VANCOCIN) IVPB 1000 mg/200 mL premix     1,000 mg 200 mL/hr over 60 Minutes Intravenous Every 8 hours 11/23/16 1238     11/23/16 1600  vancomycin (VANCOCIN) 1,500 mg in sodium chloride 0.9 % 500 mL IVPB     1,500 mg 250 mL/hr over 120 Minutes Intravenous  Once 11/23/16 1238 11/23/16 1733   11/17/16 1600  DAPTOmycin (CUBICIN) 500 mg in sodium chloride 0.9 % IVPB  Status:  Discontinued     500 mg 220 mL/hr over 30 Minutes Intravenous Every 24 hours 11/17/16 1432 11/23/16 1238   11/17/16 1500  ceftaroline (TEFLARO) 600 mg in sodium chloride 0.9 % 250 mL IVPB  Status:  Discontinued     600 mg 250 mL/hr over 60 Minutes Intravenous Every 12 hours 11/17/16 1435 11/23/16 1238   11/15/16  1800  ceftaroline (TEFLARO) 600 mg in sodium chloride 0.9 % 250 mL IVPB  Status:  Discontinued     600 mg 250 mL/hr over 60 Minutes Intravenous Every 12 hours 11/15/16 1635 11/17/16 1431   11/13/16 1215  vancomycin (VANCOCIN) IVPB 1000 mg/200 mL premix  Status:  Discontinued     1,000 mg 200 mL/hr over 60 Minutes Intravenous Every 8 hours 11/13/16 1203 11/16/16 0850   11/11/16 1100  vancomycin (VANCOCIN) IVPB 750 mg/150 ml premix  Status:  Discontinued     750 mg 150 mL/hr over 60 Minutes Intravenous Every 8 hours 11/11/16 0956 11/13/16 1203   11/11/16 0400  vancomycin (VANCOCIN) 500 mg in sodium chloride 0.9 % 100 mL IVPB  Status:  Discontinued     500 mg 100 mL/hr over 60 Minutes Intravenous Every 12 hours 11/10/16 1951 11/11/16 0956   11/10/16 2200  ceFEPIme (MAXIPIME) 1 g in dextrose 5 % 50 mL IVPB  Status:  Discontinued     1 g 100 mL/hr over 30 Minutes Intravenous Every 12 hours 11/10/16 1951 11/11/16 0926   11/10/16 2000  ceFEPIme (MAXIPIME) 2 g in dextrose 5 % 50 mL IVPB  Status:  Discontinued     2 g 100 mL/hr over 30 Minutes Intravenous   Once 11/10/16 1952 11/10/16 1954   11/10/16 2000  vancomycin (VANCOCIN) IVPB 1000 mg/200 mL premix  Status:  Discontinued     1,000 mg 200 mL/hr over 60 Minutes Intravenous  Once 11/10/16 1952 11/10/16 1954   11/10/16 1430  piperacillin-tazobactam (ZOSYN) IVPB 3.375 g     3.375 g 100 mL/hr over 30 Minutes Intravenous  Once 11/10/16 1426 11/10/16 1503   11/10/16 1430  vancomycin (VANCOCIN) IVPB 1000 mg/200 mL premix     1,000 mg 200 mL/hr over 60 Minutes Intravenous  Once 11/10/16 1426 11/10/16 1653        Subjective:   Adria Devon was seen and examined today. No complaints.  Patient denies dizziness,  shortness of breath, abdominal pain, N/V/D/C, new weakness, numbess, tingling. No acute events overnight.    Objective:   Vitals:   11/24/16 1349 11/24/16 2146 11/25/16 0515 11/25/16 0635  BP: 118/75 121/86 123/84   Pulse: 80 83 67   Resp: 17 18 18    Temp: 98.4 F (36.9 C) 98.3 F (36.8 C) 98.3 F (36.8 C)   TempSrc: Oral     SpO2: 97% 95% 97%   Weight:    63.8 kg (140 lb 11.2 oz)  Height:        Intake/Output Summary (Last 24 hours) at 11/25/16 1116 Last data filed at 11/25/16 0528  Gross per 24 hour  Intake           979.17 ml  Output                0 ml  Net           979.17 ml     Wt Readings from Last 3 Encounters:  11/25/16 63.8 kg (140 lb 11.2 oz)  03/14/16 58.5 kg (129 lb)  01/19/16 58.1 kg (128 lb)     Exam  General: Alert and oriented x 3, NAD  Eyes: PERRLA, EOMI.  HEENT: Normocephalic/atraumatic.  Cardiovascular: S1 and S2 clear, RRR, systolic murmur in tricuspid area.   Respiratory: CTAB  Gastrointestinal: Soft, NT, ND, NBS  Ext: NO EDEMA  Neuro: no FND  Musculoskeletal: No c/c  Skin: No rashes  Psych: alert and oriented 3, normal affect  Data Reviewed:  I have personally reviewed following labs and imaging studies  Micro Results Recent Results (from the past 240 hour(s))  MRSA PCR Screening     Status: None    Collection Time: 11/16/16  5:49 PM  Result Value Ref Range Status   MRSA by PCR NEGATIVE NEGATIVE Final    Comment:        The GeneXpert MRSA Assay (FDA approved for NASAL specimens only), is one component of a comprehensive MRSA colonization surveillance program. It is not intended to diagnose MRSA infection nor to guide or monitor treatment for MRSA infections. DELTA CHECK NOTED   Body fluid culture (includes gram stain)     Status: None   Collection Time: 11/16/16  8:06 PM  Result Value Ref Range Status   Specimen Description PLEURAL FLUID  Final   Special Requests NONE  Final   Gram Stain   Final    FEW WBC PRESENT, PREDOMINANTLY PMN NO ORGANISMS SEEN    Culture   Final    FEW METHICILLIN RESISTANT STAPHYLOCOCCUS AUREUS RESULT CALLED TO, READ BACK BY AND VERIFIED WITH: R. MYRICK RN, AT 1023 11/18/16 BY D. VANHOOK REGARDING CULTURE GROWTH INTERPRET RESULTS WITH CAUTION DUE TO LIMITED SPECIMEN VOLUME Performed at [facility], [address] [place], [place] [address]    Report Status 11/20/2016 FINAL  Final   Organism ID, Bacteria METHICILLIN RESISTANT STAPHYLOCOCCUS AUREUS  Final      Susceptibility   Methicillin resistant staphylococcus aureus - MIC*    CIPROFLOXACIN >=8 RESISTANT Resistant     ERYTHROMYCIN >=8 RESISTANT Resistant     GENTAMICIN <=0.5 SENSITIVE Sensitive     OXACILLIN >=4 RESISTANT Resistant     TETRACYCLINE <=1 SENSITIVE Sensitive     VANCOMYCIN 1 SENSITIVE Sensitive     TRIMETH/SULFA <=10 SENSITIVE Sensitive     CLINDAMYCIN <=0.25 SENSITIVE Sensitive     RIFAMPIN <=0.5 SENSITIVE Sensitive     Inducible Clindamycin NEGATIVE Sensitive     * FEW METHICILLIN RESISTANT STAPHYLOCOCCUS AUREUS  Culture, blood (routine x 2)     Status: None   Collection Time: 11/19/16  8:57 AM  Result Value Ref Range Status   Specimen Description BLOOD RIGHT ARM  Final   Special Requests IN PEDIATRIC BOTTLE Blood Culture adequate volume  Final   Culture NO GROWTH  5 DAYS  Final   Report Status 11/24/2016 FINAL  Final  Culture, blood (routine x 2)     Status: None   Collection Time: 11/19/16  9:05 AM  Result Value Ref Range Status   Specimen Description BLOOD RIGHT ANTECUBITAL  Final   Special Requests IN PEDIATRIC BOTTLE Blood Culture adequate volume  Final   Culture NO GROWTH 5 DAYS  Final   Report Status 11/24/2016 FINAL  Final    Radiology Reports Dg Chest 2 View  Result Date: 11/23/2016 CLINICAL DATA:  Chest tube removal EXAM: CHEST  2 VIEW COMPARISON:  11/21/2016 FINDINGS: Interval removal of right chest tube with residual small right pleural effusion versus pleural thickening. No pneumothorax. Patchy bilateral airspace opacities are again noted, unchanged. Heart is borderline in size. IMPRESSION: Interval removal of right chest tube without pneumothorax. Otherwise no change. Electronically Signed   By: Charlett Nose M.D.   On: 11/23/2016 08:44   Dg Chest 2 View  Result Date: 11/21/2016 CLINICAL DATA:  Chest soreness.  Chest tube . EXAM: CHEST  2 VIEW COMPARISON:  CT 11/20/2016.  Chest x-ray 11/20/2016 FINDINGS: Chest tube noted on  the right. Tiny right anterior hydropneumothorax again noted. No interim change . Stable cardiomegaly. Persistent bilateral pulmonary nodules including cavitary nodules. Reference is made to prior CT report. Small right pleural effusion. No pneumothorax. IMPRESSION: 1. Right chest tube in stable position. Tiny right anterior hydropneumothorax again noted. No interim change. 2. Multiple bilateral pulmonary nodules with cavitation. Reference is made to prior CT report of 11/20/2016 . Electronically Signed   By: Maisie Fus  Register   On: 11/21/2016 11:15   Dg Chest 2 View  Result Date: 11/19/2016 CLINICAL DATA:  Pneumothorax. EXAM: CHEST  2 VIEW COMPARISON:  11/19/2016. FINDINGS: Right chest tube in stable position. No significant pneumothorax noted on today's exam. Small right pleural effusion. Bilateral patchy pulmonary  infiltrates with cavitation again noted. Small left pleural effusion noted. Heart size stable. No acute bony abnormality . IMPRESSION: 1. Right chest tube in stable position. No pneumothorax noted on today's exam. Small right pleural effusion. 2. Persistent bilateral cavitary pulmonary infiltrates. Small left pleural effusion. Electronically Signed   By: Maisie Fus  Register   On: 11/19/2016 16:53   Dg Chest 2 View  Result Date: 11/10/2016 CLINICAL DATA:  Chest pain, shortness of breath for 1 week EXAM: CHEST  2 VIEW COMPARISON:  CT chest 03/12/2016, chest x-ray 03/12/2016 FINDINGS: There are bilateral nodular airspace opacities throughout the upper and lower lobes bilaterally. There is no pleural effusion or pneumothorax. The heart and mediastinal contours are unremarkable. The osseous structures are unremarkable. IMPRESSION: New bilateral nodular airspace opacities throughout the upper and lower lobes bilaterally. Findings are concerning for multifocal infection such as septic emboli. Recommend further evaluation with a CT of the chest with intravenous contrast. Electronically Signed   By: Elige Ko   On: 11/10/2016 13:23   Ct Chest Wo Contrast  Result Date: 11/20/2016 CLINICAL DATA:  Follow-up right pleural effusion EXAM: CT CHEST WITHOUT CONTRAST TECHNIQUE: Multidetector CT imaging of the chest was performed following the standard protocol without IV contrast. COMPARISON:  Radiograph 11/20/2016, CT chest 11/10/2016, prior radiographs dating back to 11/10/2016 FINDINGS: Cardiovascular: Limited assessment without intravenous contrast. Small fluid in the superior pericardial recess. There is cardiomegaly. Mediastinum/Nodes: Difficult evaluation without contrast. Mediastinal adenopathy is present right paratracheal soft tissue fullness, possible adenopathy does not appear significantly changed. Midline trachea. Esophagus grossly unremarkable. Lungs/Pleura: Interim placement of a right lower lateral chest tube  since the prior CT with the tip terminating along the right upper posterior pleural surface. Small bilateral pleural effusions right greater than left, slightly increased compared to the previous CT. Tiny right anterior hydropneumothorax. Multiple bilateral pulmonary nodules and cavitary lesions. Some of the cavitary lesions have decreased, however if there appears to be increased nodules/disease most notable in the left upper lobe and the right lower lobe. Decreased septal thickening compared to the previous exam. Upper Abdomen: Spleen appears enlarged. Partially visualized surgical clips in the gallbladder fossa. Musculoskeletal: No acute or suspicious bone lesion. IMPRESSION: 1. Interim placement of right-sided chest tube. Small residual pleural effusions right greater than left, mildly increased compared to the prior chest CT. Small right anterior hydropneumothorax. 2. Innumerable bilateral lung nodules and cavitary lesions, suspected to represent septic emboli. This appears increased in the left upper and right lower lobe since the prior CT. 3. Suspect mediastinal adenopathy although evaluation of the mediastinum is limited without contrast 4. Splenomegaly Electronically Signed   By: Jasmine Pang M.D.   On: 11/20/2016 21:25   Ct Chest Wo Contrast  Result Date: 11/10/2016 CLINICAL DATA:  She c/o  some chest discomfrot and shortness of breath x 1 week. She states these are similar s/s she had a year ago, at which time she was dx with "pulmonary abscess". She is in no distress. IV drug user EXAM: CT CHEST WITHOUT CONTRAST TECHNIQUE: Multidetector CT imaging of the chest was performed following the standard protocol without IV contrast. COMPARISON:  Chest CT angiogram dated 03/12/2016. FINDINGS: Cardiovascular: Probable small pericardial effusion and/or pericardial thickening, difficult to characterize without IV contrast. Heart size is within normal limits. Thoracic aorta appears to be normal in caliber.  Mediastinum/Nodes: Soft tissue density material within the right upper peritracheal space, also difficult to characterize without IV contrast, presumed lymphadenopathy. Additional mild lymphadenopathy within the anterior mediastinum and aortopulmonary window region. Lungs/Pleura: Numerous cavitary consolidations throughout both lungs, peripherally distributed suggesting septic emboli. More confluent consolidations at each lung base, more likely atelectasis than pneumonia. Small right pleural effusion. Upper Abdomen: Suspected splenomegaly, incompletely imaged. Musculoskeletal: No acute or suspicious osseous finding. IMPRESSION: 1. Numerous cavitary consolidations throughout both lungs, with a peripheral distribution suggesting septic emboli. Largest cavitary consolidation is within the left lower lobe measuring approximately 4 cm greatest dimension. 2. Probable small pericardial effusion and/or pericardial wall thickening, difficult to delineate without IV contrast. 3. Probable mediastinal lymphadenopathy, again difficult to definitively characterize without IV contrast, alternatively confluent fluid or edema within the mediastinum. 4. Small right pleural effusion. 5. Probable splenomegaly, incompletely imaged at the lower aspects of this chest CT. 6. Right IJ line in place with tip passing through the right atrium and into the right ventricle. Recommend retracting to the cavoatrial junction for optimal radiographic positioning. These results and recommendations were called by telephone at the time of interpretation on 11/10/2016 at 7:35 pm to Dr. Katrinka Blazing , who verbally acknowledged these results. Electronically Signed   By: Bary Richard M.D.   On: 11/10/2016 19:36   Ct Abdomen Pelvis W Contrast  Result Date: 11/14/2016 CLINICAL DATA:  Right upper quadrant pain times 2-3 days. Polysubstance abuse. Cholecystectomy. History of endocarditis and septic emboli. EXAM: CT ABDOMEN AND PELVIS WITH CONTRAST TECHNIQUE:  Multidetector CT imaging of the abdomen and pelvis was performed using the standard protocol following bolus administration of intravenous contrast. CONTRAST:  80mL ISOVUE-300 IOPAMIDOL (ISOVUE-300) INJECTION 61% COMPARISON:  03/12/2016 chest CT FINDINGS: Lower chest: Small right effusion with several bilateral cavitary opacities in both lower lobes concerning for septic emboli or cavitary pneumonia. More confluent airspace opacity in the left lower lobe is also noted. The visualized heart is normal in size. There is no pericardial effusion. No large central pulmonary embolus. Hepatobiliary: Normal appearance of the liver without space-occupying mass or biliary dilatation. Cholecystectomy. Pancreas: No pancreatic mass or ductal dilatation. Mild enlargement of the pancreatic head with surrounding peripancreatic fluid is noted. Correlate to exclude a pancreatitis. Spleen: The spleen is enlarged measuring 15.7 x 5.6 x 17 cm (volume = 780 cm^3) and demonstrates a developmental cleft, partially visualized on prior chest CT and therefore believed less likely to represent a laceration. Adrenals/Urinary Tract: Adrenal glands are unremarkable. Kidneys are normal, without renal calculi, solid appearing lesions, or hydronephrosis. There appear to be pair of water attenuating cysts in the lower pole the right kidney measuring up to 2 x 1.3 cm in aggregate. Bladder is unremarkable. Stomach/Bowel: Stomach is within normal limits. Appendix appears normal. No evidence of bowel wall thickening, distention, or inflammatory changes. Vascular/Lymphatic: No significant vascular findings are present. No enlarged abdominal or pelvic lymph nodes. Reproductive: Uterus and bilateral adnexa are  unremarkable. Other: Moderate volume of ascites.  Anasarca. Musculoskeletal: No acute or significant osseous findings. IMPRESSION: 1. Bibasilar, multifocal cavitary lesions noted within the visualized lower lobes consistent with septic emboli or  possibly cavitary pneumonia. More confluent airspace disease in the left lower lobe without cavitation is also present. There is a small right pleural effusion. 2. Anasarca with moderate volume of ascites noted within the abdomen and pelvis. Question right heart dysfunction/failure. 3. Splenomegaly. 4. Water attenuating cysts in the lower pole the right kidney in aggregate measuring 2 x 1.3 cm. 5. Status post cholecystectomy. Electronically Signed   By: Tollie Eth M.D.   On: 11/14/2016 16:26   Dg Chest 1v Repeat Same Day  Result Date: 11/18/2016 CLINICAL DATA:  Chest tube placement EXAM: CHEST - 1 VIEW SAME DAY COMPARISON:  Earlier same day FINDINGS: Large bore chest tube placed on the right along the lateral margin. Marked reduction an amount of right pleural air, nearly completely evacuated. Patchy infiltrates persist in both lower lobes. IMPRESSION: Large bore chest tube placed. Near complete resolution of right pneumothorax. Electronically Signed   By: Paulina Fusi M.D.   On: 11/18/2016 11:40   Dg Chest Port 1 View  Result Date: 11/20/2016 CLINICAL DATA:  26 year old female with pneumothorax. Recent transesophageal echocardiogram. Subsequent encounter. EXAM: PORTABLE CHEST 1 VIEW COMPARISON:  11/20/2016 8:30 a.m. FINDINGS: Right-sided chest tube remains in place. No pneumothorax detected on this frontal projection. Cavitary lesions bilaterally. Left base consolidation. Atelectasis/pleural thickening right lung base. Cardiomegaly. IMPRESSION: No significant change since exam earlier today. Electronically Signed   By: Lacy Duverney M.D.   On: 11/20/2016 13:55   Dg Chest Port 1 View  Result Date: 11/20/2016 CLINICAL DATA:  Chest tube, shortness of Breath EXAM: PORTABLE CHEST 1 VIEW COMPARISON:  11/19/2016 FINDINGS: Right chest tube remains in place, unchanged. No pneumothorax. Patchy bilateral airspace opacities are again noted, some with cavitation. Small right pleural effusion. Heart is borderline in  size. IMPRESSION: Right chest tube remains in place without pneumothorax. Stable patchy bilateral airspace disease, some of which is cavitary. Electronically Signed   By: Charlett Nose M.D.   On: 11/20/2016 08:42   Dg Chest Port 1 View  Result Date: 11/19/2016 CLINICAL DATA:  26 year old female with cavitary lung lesions possibly from septic endocarditis. Subsequent encounter. EXAM: PORTABLE CHEST 1 VIEW COMPARISON:  11/18/2016. FINDINGS: Right-sided chest tube remains in place. Tiny hydropneumothorax versus fluid within minor fissure noted. Multiple cavitary lesions. Right-sided pleural effusion. Basilar atelectasis versus infiltrate greater on the left. Cardiomegaly. Mild scoliosis. IMPRESSION: Right-sided chest tube remains in place. Tiny hydropneumothorax versus fluid within minor fissure. Multiple cavitary lesions. Right-sided pleural effusion. Basilar atelectasis versus infiltrate greater on the left. Electronically Signed   By: Lacy Duverney M.D.   On: 11/19/2016 07:16   Dg Chest Port 1 View  Result Date: 11/18/2016 CLINICAL DATA:  Followup left pneumothorax. Patient with bacterial endocarditis. EXAM: PORTABLE CHEST 1 VIEW COMPARISON:  11/17/2016 and prior studies FINDINGS: A moderate right hydropneumothorax is unchanged in size, but now with pleural fluid in its basilar portion. A right thoracostomy tube is unchanged. Scattered pulmonary opacities/ nodules and left lower lung consolidations/atelectasis again noted. There has been no other interval change. IMPRESSION: Moderate right hydropneumothorax, unchanged in size but now with pleural fluid in its basilar portion. Right thoracostomy tube remains unchanged. Electronically Signed   By: Harmon Pier M.D.   On: 11/18/2016 10:14   Dg Chest Port 1 View  Result Date: 11/17/2016 CLINICAL DATA:  Follow-up right-sided chest tube placement. Initial encounter. EXAM: PORTABLE CHEST 1 VIEW COMPARISON:  Chest radiograph performed 11/16/2016 FINDINGS: The  patient's moderate right-sided pneumothorax has decreased in size. The right-sided chest tube is grossly unchanged in appearance. Vascular congestion is noted. Patchy left-sided airspace opacities raise concern for pneumonia. Right-sided airspace opacity may reflect atelectasis. A small left pleural effusion is noted. The cardiomediastinal silhouette is mildly enlarged. No acute osseous abnormalities are identified. IMPRESSION: 1. Moderate right-sided pneumothorax has decreased in size. Right-sided chest tube is grossly unchanged in appearance. 2. Vascular congestion and mild cardiomegaly. Patchy left-sided airspace opacities raise concern for pneumonia. Small left pleural effusion noted. 3. Right-sided airspace opacity may reflect atelectasis. Electronically Signed   By: Roanna Raider M.D.   On: 11/17/2016 00:33   Dg Chest Port 1 View  Result Date: 11/16/2016 CLINICAL DATA:  Chest tube placement for right pneumothorax. EXAM: PORTABLE CHEST 1 VIEW COMPARISON:  Single-view of the chest earlier today and 11/11/2016. FINDINGS: Pigtail catheter is now in place in the right chest. Large right pneumothorax is unchanged. Patchy airspace disease in cavitary lesions in the left chest persist. Heart size is normal. IMPRESSION: No change in large right pneumothorax after chest tube placement. No change in patchy airspace disease in cavitary lesions on the left. These results were called by telephone at the time of interpretation on 11/16/2016 at 8:10 pm to Estrella Deeds, RN, who verbally acknowledged these results. Electronically Signed   By: Drusilla Kanner M.D.   On: 11/16/2016 20:12   Dg Chest Port 1 View  Result Date: 11/16/2016 CLINICAL DATA:  Shortness of Breath EXAM: PORTABLE CHEST 1 VIEW COMPARISON:  11/11/2016 FINDINGS: Cardiac shadow is within normal limits. Cavitary nodular densities are noted throughout both lungs similar to that seen on previous CT examination. A new right-sided moderately large pneumothorax  is noted with consolidation of the lower lobe on the right. No acute bony abnormality is noted. IMPRESSION: Moderately large right-sided pneumothorax new from the prior exam. Near complete collapse of the right lower lobe is noted. Stable cavitary lesions within both lungs. Critical Value/emergent results were called by telephone at the time of interpretation on 11/16/2016 at 6:24 pm to Dr Leitha Bleak, who verbally acknowledged these results. Electronically Signed   By: Alcide Clever M.D.   On: 11/16/2016 18:27   Dg Chest Port 1 View  Result Date: 11/11/2016 CLINICAL DATA:  Check right IJ line placement.  Initial encounter. EXAM: PORTABLE CHEST 1 VIEW COMPARISON:  Chest radiograph performed 11/10/2016 FINDINGS: The right IJ line is noted ending at the right atrium. This could be retracted 2-3 cm. Mild vascular congestion is noted. Mild left-sided opacities may reflect atelectasis or mild pneumonia. No pleural effusion or pneumothorax is seen. The cardiomediastinal silhouette is borderline normal in size. No acute osseous abnormalities are identified. IMPRESSION: 1. Right IJ line noted ending at the right atrium. This could be retracted 2-3 cm, as deemed clinically appropriate. 2. Mild vascular congestion noted. 3. Mild left-sided airspace opacities may reflect atelectasis or mild pneumonia. Electronically Signed   By: Roanna Raider M.D.   On: 11/11/2016 00:37   Dg Chest Port 1 View  Result Date: 11/10/2016 CLINICAL DATA:  Retraction of the IJ line about 10 cm EXAM: PORTABLE CHEST 1 VIEW COMPARISON:  CT chest 11/10/2016.  Chest 11/10/2016. FINDINGS: The left central venous catheter tip localizes to the mid/distal right atrial region. If placement at or above the cavoatrial junction is desired, retraction of about 3-4 cm is recommended.  No pneumothorax. Shallow inspiration with atelectasis in the lung bases. Borderline heart size. Patchy airspace infiltrates in both lungs could indicate edema, aspiration, or  pneumonia. No blunting of costophrenic angles. IMPRESSION: Central venous catheter tip is in the mid/ low right atrial region. If placement at or above the cavoatrial junction is desired, retraction of about 3-4 cm is recommended. Electronically Signed   By: Burman Nieves M.D.   On: 11/10/2016 21:35   Dg Chest Portable 1 View  Result Date: 11/10/2016 CLINICAL DATA:  Status post central line placement. EXAM: PORTABLE CHEST 1 VIEW COMPARISON:  Chest x-ray from earlier same day. FINDINGS: Interval placement of a right IJ central line with tip passing to the left of midline and likely in the right ventricle or main pulmonary artery. The bilateral small nodular airspace opacities throughout both lungs are unchanged in the short-term interval. Suspect small bilateral pleural effusions. No pneumothorax seen. IMPRESSION: 1. Right IJ central line placement with tip positioned to the left of midline either within the right ventricle or main pulmonary artery. Recommend retracting at least 10 cm. 2. Bilateral small nodular airspace opacities throughout both lungs are unchanged in the short-term interval. As recommended on earlier chest x-ray report, would consider chest CT for further characterization. 3. Suspect small bilateral pleural effusions. Electronically Signed   By: Bary Richard M.D.   On: 11/10/2016 18:37    Lab Data:  CBC:  Recent Labs Lab 11/20/16 0239 11/21/16 0256 11/22/16 0231 11/24/16 0606 11/25/16 0445  WBC 9.4 8.8 7.4 5.4 5.6  HGB 7.1* 8.2* 8.5* 7.5* 7.1*  HCT 22.6* 25.7* 26.3* 23.8* 22.7*  MCV 85.9 85.4 85.7 86.5 88.3  PLT 398 411* 407* 360 332   Basic Metabolic Panel:  Recent Labs Lab 11/19/16 0414  11/20/16 0239 11/21/16 0256 11/22/16 0231 11/23/16 0254 11/24/16 0606 11/25/16 0445  NA 139  < > 137 136 136  --  139 138  K 3.9  < > 4.0 3.7 3.7  --  3.4* 3.8  CL 109  < > 109 106 105  --  107 107  CO2 23  < > 22 22 22   --  24 26  GLUCOSE 97  < > 81 93 88  --  89 97    BUN 15  < > 10 9 9   --  7 10  CREATININE 1.37*  < > 0.93 0.95 0.84 0.78 0.79 0.80  CALCIUM 7.9*  < > 7.9* 7.9* 8.0*  --  7.9* 8.3*  MG 2.2  --  1.7 1.7  --   --   --   --   PHOS 6.0*  --  4.9* 4.8*  --   --   --   --   < > = values in this interval not displayed. GFR: Estimated Creatinine Clearance: 95.9 mL/min (by C-G formula based on SCr of 0.8 mg/dL). Liver Function Tests:  Recent Labs Lab 11/21/16 0256  AST 13*  ALT 8*  ALKPHOS 151*  BILITOT 0.4  PROT 5.6*  ALBUMIN 1.5*   No results for input(s): LIPASE, AMYLASE in the last 168 hours. No results for input(s): AMMONIA in the last 168 hours. Coagulation Profile: No results for input(s): INR, PROTIME in the last 168 hours. Cardiac Enzymes: No results for input(s): CKTOTAL, CKMB, CKMBINDEX, TROPONINI in the last 168 hours. BNP (last 3 results) No results for input(s): PROBNP in the last 8760 hours. HbA1C: No results for input(s): HGBA1C in the last 72 hours. CBG:  Recent Labs  Lab 11/19/16 1223  GLUCAP 83   Lipid Profile: No results for input(s): CHOL, HDL, LDLCALC, TRIG, CHOLHDL, LDLDIRECT in the last 72 hours. Thyroid Function Tests: No results for input(s): TSH, T4TOTAL, FREET4, T3FREE, THYROIDAB in the last 72 hours. Anemia Panel: No results for input(s): VITAMINB12, FOLATE, FERRITIN, TIBC, IRON, RETICCTPCT in the last 72 hours. Urine analysis:    Component Value Date/Time   COLORURINE YELLOW 11/10/2016 2132   APPEARANCEUR CLEAR 11/10/2016 2132   LABSPEC 1.006 11/10/2016 2132   PHURINE 5.0 11/10/2016 2132   GLUCOSEU NEGATIVE 11/10/2016 2132   HGBUR MODERATE (A) 11/10/2016 2132   BILIRUBINUR NEGATIVE 11/10/2016 2132   KETONESUR NEGATIVE 11/10/2016 2132   PROTEINUR NEGATIVE 11/10/2016 2132   UROBILINOGEN 0.2 08/05/2010 1821   NITRITE NEGATIVE 11/10/2016 2132   LEUKOCYTESUR SMALL (A) 11/10/2016 2132     Phebe Dettmer M.D. [facility] 11/25/2016, 11:16 AM  Pager: [phone] Between 7am to 7pm -  call Pager - [phone]  After 7pm go to [email or web address] - password [email or web address]  Call night coverage person covering after 7pm            [facility]                                                                              Patient Demographics  Parish Augustine, is a 26 y.o. female, DOB - September 09, 1990, MVH:[id number]  Admit date - 11/10/2016   Admitting Physician Coralyn Helling, MD  Outpatient Primary MD for the patient is Patient, No Pcp Per  Outpatient specialists:   LOS - 15  days   Medical records reviewed and are as summarized below:    No chief complaint on file.      Brief summary    26 year old Caucasian female with a past medical history of anxiety, depression, heroin abuse, presented with weakness, cough and chest pain. Initially was in septic shock and required pressors. Blood cultures grew MRSA. 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 She was placed on IV antibiotics. She also developed a spontaneous right-sided pneumothorax. Chest tube was placed by critical care medicine. She was transferred to [facility] for opinion from cardiothoracic surgery. Patient is not a candidate for surgery at this time.   Assessment & Plan    Principal Problem: MRSA bacteremia/Septic shock with right TV endocarditis and septic emboli to lungs/MRSA empyema - Patient presented with weakness, coughing and chest pain and septic shock requiring vasopressors. Blood cultures 5/27 grew out MRSA and was found to have septic emboli throughout her lungs.  -Repeat cultures were still positive, blood cultures repeat on 6/4 has been negative to date  - 2-D echo on 5/27 showed tricuspid vegetations with moderate TR. TEE on 6/1 showed large tricuspid vegetation, severe TR with dilated RV along with a loculated left effusion -HIV, Hep C negative. Due to family request, RPR was done and it was negative. - Due to  persistent bacteremia patient was started on daptomycin 6/2 and Ceftaroline continued to cover lung infection. - cardiothoracic surgery was also consulted, not a candidate for surgery at this time.  - Per ID recommendations today, DC Ceftaroline and daptomycin,  use vancomycin alone, duration through July 15, will need BMP, CBC weekly, trough goal of 15-20. Place PICC line today  Active problems: Right spontaneous pneumothorax- acute respiratory failure/MRSA Empyema -Right empyema with pneumothorax, positive for MRSA. Status post chest tube. - Chest tube removed on 6/7, chest x-ray clear   Acute kidney injury -Baseline creatinine 0.7, creatinine increased to 1.37, likely secondary to dehydration and NSAIDs. - patient received IV fluids, NSAIDs were discontinued, and creatinine function has normalized.   RUQ pain, likely musculoskeletal -CT abdomen and pelvis showed no liver or gallbladder abnormalities, possible musculoskeletal origin, currently stable.    Ascites, anasarca due to right sided hear failure - as a result of severe TR, net positive balance of 13.7 L - limit IVF, hold off on administering diuretics in setting of sepsis and borderline BPs, O2 sats 96% on room air  Splenomegaly - etiology undetermined- CT on 5/30 revealed that the spleen is enlarged measuring 15.7 x 5.6 x 17 cm (volume= 780 cm^3) - liver is unremarkable on CT  Normocytic Anemia - H&H currently stable, was transfused on 6/5, no overt bleeding noted - Iron was 12. Ferritin 213. B-12 1850. Folate 7.8.  Hypokalemia/Hypophosphatemia/Hypomagnesemia - Resolved  Thrombocytopenia - Resolved  Heroin abuse - cont Suboxone- will need transition to a drug rehab program once stable.   Anxiety - cont Buspar, Neurontin, Xanax  Nicotine dependence -Continue  Nicoderm patch   Code Status: Full CODE STATUS  DVT Prophylaxis:  heparin  Family Communication: Discussed in detail with the patient, all  imaging results, lab results explained to the patient and patient's mother in detail at the bedside   Disposition Plan: Transfer to telemetry floor, will need skilled nursing facility  Time Spent in minutes  25 minutes  Procedures:  TEE  - Left ventricle: The cavity size was normal. Wall thickness was normal. Systolic function was normal. The estimated ejection fraction was in the range of 55% to 60%. Wall motion was normal; there were no regional wall motion abnormalities. - Left atrium: No evidence of thrombus in the atrial cavity or appendage. - Right ventricle: The cavity size was mildly dilated. Wall thickness was normal. - Right atrium: No evidence of thrombus in the atrial cavity or appendage. - Tricuspid valve: Flail motion of the posterior leaflet. There is a 8x5 mm vegetation attached to the posterior leaflet, but most of the &quot;mass&quot; described on transthoracic imaging appears to be the flail segment of the valve. There was severe regurgitation directed eccentrically and toward the free wall.  Right chest tube placement for pneumothorax 6/1  Placement of a larger bore chest tube in the right chest 6/3  Consultants:   Infectious disease Cardiothoracic surgery  Antimicrobials:   Daptomycin 6/2> 6/8  Teflaro 5/31> 6/8  Vancomycin 6/8>> will need to July 15   Medications  Scheduled Meds: . acetaminophen  1,000 mg Oral Q6H  . buprenorphine-naloxone  2 tablet Sublingual Daily  . busPIRone  10 mg Oral BID  . citalopram  10 mg Oral Daily  . gabapentin  300 mg Oral TID  . heparin  5,000 Units Subcutaneous Q8H  . nicotine  14 mg Transdermal Daily  . saccharomyces boulardii  250 mg Oral BID   Continuous Infusions: . sodium chloride 250 mL (11/19/16 0700)  . vancomycin Stopped (11/25/16 0837)   PRN Meds:.sodium chloride, albuterol, ALPRAZolam, docusate, nicotine polacrilex, ondansetron (ZOFRAN) IV, oxyCODONE, sodium chloride  flush   Antibiotics   Anti-infectives    Start  Dose/Rate Route Frequency Ordered Stop   11/24/16 0000  vancomycin (VANCOCIN) IVPB 1000 mg/200 mL premix     1,000 mg 200 mL/hr over 60 Minutes Intravenous Every 8 hours 11/23/16 1238     11/23/16 1600  vancomycin (VANCOCIN) 1,500 mg in sodium chloride 0.9 % 500 mL IVPB     1,500 mg 250 mL/hr over 120 Minutes Intravenous  Once 11/23/16 1238 11/23/16 1733   11/17/16 1600  DAPTOmycin (CUBICIN) 500 mg in sodium chloride 0.9 % IVPB  Status:  Discontinued     500 mg 220 mL/hr over 30 Minutes Intravenous Every 24 hours 11/17/16 1432 11/23/16 1238   11/17/16 1500  ceftaroline (TEFLARO) 600 mg in sodium chloride 0.9 % 250 mL IVPB  Status:  Discontinued     600 mg 250 mL/hr over 60 Minutes Intravenous Every 12 hours 11/17/16 1435 11/23/16 1238   11/15/16 1800  ceftaroline (TEFLARO) 600 mg in sodium chloride 0.9 % 250 mL IVPB  Status:  Discontinued     600 mg 250 mL/hr over 60 Minutes Intravenous Every 12 hours 11/15/16 1635 11/17/16 1431   11/13/16 1215  vancomycin (VANCOCIN) IVPB 1000 mg/200 mL premix  Status:  Discontinued     1,000 mg 200 mL/hr over 60 Minutes Intravenous Every 8 hours 11/13/16 1203 11/16/16 0850   11/11/16 1100  vancomycin (VANCOCIN) IVPB 750 mg/150 ml premix  Status:  Discontinued     750 mg 150 mL/hr over 60 Minutes Intravenous Every 8 hours 11/11/16 0956 11/13/16 1203   11/11/16 0400  vancomycin (VANCOCIN) 500 mg in sodium chloride 0.9 % 100 mL IVPB  Status:  Discontinued     500 mg 100 mL/hr over 60 Minutes Intravenous Every 12 hours 11/10/16 1951 11/11/16 0956   11/10/16 2200  ceFEPIme (MAXIPIME) 1 g in dextrose 5 % 50 mL IVPB  Status:  Discontinued     1 g 100 mL/hr over 30 Minutes Intravenous Every 12 hours 11/10/16 1951 11/11/16 0926   11/10/16 2000  ceFEPIme (MAXIPIME) 2 g in dextrose 5 % 50 mL IVPB  Status:  Discontinued     2 g 100 mL/hr over 30 Minutes Intravenous  Once 11/10/16 1952 11/10/16 1954    11/10/16 2000  vancomycin (VANCOCIN) IVPB 1000 mg/200 mL premix  Status:  Discontinued     1,000 mg 200 mL/hr over 60 Minutes Intravenous  Once 11/10/16 1952 11/10/16 1954   11/10/16 1430  piperacillin-tazobactam (ZOSYN) IVPB 3.375 g     3.375 g 100 mL/hr over 30 Minutes Intravenous  Once 11/10/16 1426 11/10/16 1503   11/10/16 1430  vancomycin (VANCOCIN) IVPB 1000 mg/200 mL premix     1,000 mg 200 mL/hr over 60 Minutes Intravenous  Once 11/10/16 1426 11/10/16 1653        Subjective:   Adria Devon was seen and examined today.No fevers or chills, denies any specific complaints. Patient denies dizziness,  shortness of breath, abdominal pain, N/V/D/C, new weakness, numbess, tingling. No acute events overnight.    Objective:   Vitals:   11/24/16 1349 11/24/16 2146 11/25/16 0515 11/25/16 0635  BP: 118/75 121/86 123/84   Pulse: 80 83 67   Resp: 17 18 18    Temp: 98.4 F (36.9 C) 98.3 F (36.8 C) 98.3 F (36.8 C)   TempSrc: Oral     SpO2: 97% 95% 97%   Weight:    63.8 kg (140 lb 11.2 oz)  Height:        Intake/Output Summary (Last 24 hours) at 11/25/16  1116 Last data filed at 11/25/16 0528  Gross per 24 hour  Intake           979.17 ml  Output                0 ml  Net           979.17 ml     Wt Readings from Last 3 Encounters:  11/25/16 63.8 kg (140 lb 11.2 oz)  03/14/16 58.5 kg (129 lb)  01/19/16 58.1 kg (128 lb)     Exam  General: Alert and oriented x 3, NAD  Eyes: PERRLA, EOMI  HEENT:  Atraumatic, normocephalic, normal oropharynx  Cardiovascular: S1 S2 clear, systolic murmur in tricuspid area, min pedal edema   Respiratory: CTAB  Gastrointestinal: SoftNT, ND, NBS  Ext: mild  pedal edema bilaterally  Neuro: no focal neurological deficits  Musculoskeletal: No digital cyanosis, clubbing  Skin: No rashes  Psych: alert and oriented 3, normal affect   Data Reviewed:  I have personally reviewed following labs and imaging studies  Micro  Results Recent Results (from the past 240 hour(s))  MRSA PCR Screening     Status: None   Collection Time: 11/16/16  5:49 PM  Result Value Ref Range Status   MRSA by PCR NEGATIVE NEGATIVE Final    Comment:        The GeneXpert MRSA Assay (FDA approved for NASAL specimens only), is one component of a comprehensive MRSA colonization surveillance program. It is not intended to diagnose MRSA infection nor to guide or monitor treatment for MRSA infections. DELTA CHECK NOTED   Body fluid culture (includes gram stain)     Status: None   Collection Time: 11/16/16  8:06 PM  Result Value Ref Range Status   Specimen Description PLEURAL FLUID  Final   Special Requests NONE  Final   Gram Stain   Final    FEW WBC PRESENT, PREDOMINANTLY PMN NO ORGANISMS SEEN    Culture   Final    FEW METHICILLIN RESISTANT STAPHYLOCOCCUS AUREUS RESULT CALLED TO, READ BACK BY AND VERIFIED WITH: R. MYRICK RN, AT 1023 11/18/16 BY D. VANHOOK REGARDING CULTURE GROWTH INTERPRET RESULTS WITH CAUTION DUE TO LIMITED SPECIMEN VOLUME Performed at [facility], [address], [place], [place] [address]    Report Status 11/20/2016 FINAL  Final   Organism ID, Bacteria METHICILLIN RESISTANT STAPHYLOCOCCUS AUREUS  Final      Susceptibility   Methicillin resistant staphylococcus aureus - MIC*    CIPROFLOXACIN >=8 RESISTANT Resistant     ERYTHROMYCIN >=8 RESISTANT Resistant     GENTAMICIN <=0.5 SENSITIVE Sensitive     OXACILLIN >=4 RESISTANT Resistant     TETRACYCLINE <=1 SENSITIVE Sensitive     VANCOMYCIN 1 SENSITIVE Sensitive     TRIMETH/SULFA <=10 SENSITIVE Sensitive     CLINDAMYCIN <=0.25 SENSITIVE Sensitive     RIFAMPIN <=0.5 SENSITIVE Sensitive     Inducible Clindamycin NEGATIVE Sensitive     * FEW METHICILLIN RESISTANT STAPHYLOCOCCUS AUREUS  Culture, blood (routine x 2)     Status: None   Collection Time: 11/19/16  8:57 AM  Result Value Ref Range Status   Specimen Description BLOOD RIGHT ARM  Final    Special Requests IN PEDIATRIC BOTTLE Blood Culture adequate volume  Final   Culture NO GROWTH 5 DAYS  Final   Report Status 11/24/2016 FINAL  Final  Culture, blood (routine x 2)     Status: None   Collection Time: 11/19/16  9:05 AM  Result Value Ref Range Status   Specimen Description BLOOD RIGHT ANTECUBITAL  Final   Special Requests IN PEDIATRIC BOTTLE Blood Culture adequate volume  Final   Culture NO GROWTH 5 DAYS  Final   Report Status 11/24/2016 FINAL  Final    Radiology Reports Dg Chest 2 View  Result Date: 11/23/2016 CLINICAL DATA:  Chest tube removal EXAM: CHEST  2 VIEW COMPARISON:  11/21/2016 FINDINGS: Interval removal of right chest tube with residual small right pleural effusion versus pleural thickening. No pneumothorax. Patchy bilateral airspace opacities are again noted, unchanged. Heart is borderline in size. IMPRESSION: Interval removal of right chest tube without pneumothorax. Otherwise no change. Electronically Signed   By: Charlett NoseKevin  Dover M.D.   On: 11/23/2016 08:44   Dg Chest 2 View  Result Date: 11/21/2016 CLINICAL DATA:  Chest soreness.  Chest tube . EXAM: CHEST  2 VIEW COMPARISON:  CT 11/20/2016.  Chest x-ray 11/20/2016 FINDINGS: Chest tube noted on the right. Tiny right anterior hydropneumothorax again noted. No interim change . Stable cardiomegaly. Persistent bilateral pulmonary nodules including cavitary nodules. Reference is made to prior CT report. Small right pleural effusion. No pneumothorax. IMPRESSION: 1. Right chest tube in stable position. Tiny right anterior hydropneumothorax again noted. No interim change. 2. Multiple bilateral pulmonary nodules with cavitation. Reference is made to prior CT report of 11/20/2016 . Electronically Signed   By: Maisie Fushomas  Register   On: 11/21/2016 11:15   Dg Chest 2 View  Result Date: 11/19/2016 CLINICAL DATA:  Pneumothorax. EXAM: CHEST  2 VIEW COMPARISON:  11/19/2016. FINDINGS: Right chest tube in stable position. No significant  pneumothorax noted on today's exam. Small right pleural effusion. Bilateral patchy pulmonary infiltrates with cavitation again noted. Small left pleural effusion noted. Heart size stable. No acute bony abnormality . IMPRESSION: 1. Right chest tube in stable position. No pneumothorax noted on today's exam. Small right pleural effusion. 2. Persistent bilateral cavitary pulmonary infiltrates. Small left pleural effusion. Electronically Signed   By: Maisie Fushomas  Register   On: 11/19/2016 16:53   Dg Chest 2 View  Result Date: 11/10/2016 CLINICAL DATA:  Chest pain, shortness of breath for 1 week EXAM: CHEST  2 VIEW COMPARISON:  CT chest 03/12/2016, chest x-ray 03/12/2016 FINDINGS: There are bilateral nodular airspace opacities throughout the upper and lower lobes bilaterally. There is no pleural effusion or pneumothorax. The heart and mediastinal contours are unremarkable. The osseous structures are unremarkable. IMPRESSION: New bilateral nodular airspace opacities throughout the upper and lower lobes bilaterally. Findings are concerning for multifocal infection such as septic emboli. Recommend further evaluation with a CT of the chest with intravenous contrast. Electronically Signed   By: Elige KoHetal  Patel   On: 11/10/2016 13:23   Ct Chest Wo Contrast  Result Date: 11/20/2016 CLINICAL DATA:  Follow-up right pleural effusion EXAM: CT CHEST WITHOUT CONTRAST TECHNIQUE: Multidetector CT imaging of the chest was performed following the standard protocol without IV contrast. COMPARISON:  Radiograph 11/20/2016, CT chest 11/10/2016, prior radiographs dating back to 11/10/2016 FINDINGS: Cardiovascular: Limited assessment without intravenous contrast. Small fluid in the superior pericardial recess. There is cardiomegaly. Mediastinum/Nodes: Difficult evaluation without contrast. Mediastinal adenopathy is present right paratracheal soft tissue fullness, possible adenopathy does not appear significantly changed. Midline trachea.  Esophagus grossly unremarkable. Lungs/Pleura: Interim placement of a right lower lateral chest tube since the prior CT with the tip terminating along the right upper posterior pleural surface. Small bilateral pleural effusions right greater than left, slightly increased compared to the  previous CT. Tiny right anterior hydropneumothorax. Multiple bilateral pulmonary nodules and cavitary lesions. Some of the cavitary lesions have decreased, however if there appears to be increased nodules/disease most notable in the left upper lobe and the right lower lobe. Decreased septal thickening compared to the previous exam. Upper Abdomen: Spleen appears enlarged. Partially visualized surgical clips in the gallbladder fossa. Musculoskeletal: No acute or suspicious bone lesion. IMPRESSION: 1. Interim placement of right-sided chest tube. Small residual pleural effusions right greater than left, mildly increased compared to the prior chest CT. Small right anterior hydropneumothorax. 2. Innumerable bilateral lung nodules and cavitary lesions, suspected to represent septic emboli. This appears increased in the left upper and right lower lobe since the prior CT. 3. Suspect mediastinal adenopathy although evaluation of the mediastinum is limited without contrast 4. Splenomegaly Electronically Signed   By: Jasmine Pang M.D.   On: 11/20/2016 21:25   Ct Chest Wo Contrast  Result Date: 11/10/2016 CLINICAL DATA:  She c/o some chest discomfrot and shortness of breath x 1 week. She states these are similar s/s she had a year ago, at which time she was dx with "pulmonary abscess". She is in no distress. IV drug user EXAM: CT CHEST WITHOUT CONTRAST TECHNIQUE: Multidetector CT imaging of the chest was performed following the standard protocol without IV contrast. COMPARISON:  Chest CT angiogram dated 03/12/2016. FINDINGS: Cardiovascular: Probable small pericardial effusion and/or pericardial thickening, difficult to characterize without IV  contrast. Heart size is within normal limits. Thoracic aorta appears to be normal in caliber. Mediastinum/Nodes: Soft tissue density material within the right upper peritracheal space, also difficult to characterize without IV contrast, presumed lymphadenopathy. Additional mild lymphadenopathy within the anterior mediastinum and aortopulmonary window region. Lungs/Pleura: Numerous cavitary consolidations throughout both lungs, peripherally distributed suggesting septic emboli. More confluent consolidations at each lung base, more likely atelectasis than pneumonia. Small right pleural effusion. Upper Abdomen: Suspected splenomegaly, incompletely imaged. Musculoskeletal: No acute or suspicious osseous finding. IMPRESSION: 1. Numerous cavitary consolidations throughout both lungs, with a peripheral distribution suggesting septic emboli. Largest cavitary consolidation is within the left lower lobe measuring approximately 4 cm greatest dimension. 2. Probable small pericardial effusion and/or pericardial wall thickening, difficult to delineate without IV contrast. 3. Probable mediastinal lymphadenopathy, again difficult to definitively characterize without IV contrast, alternatively confluent fluid or edema within the mediastinum. 4. Small right pleural effusion. 5. Probable splenomegaly, incompletely imaged at the lower aspects of this chest CT. 6. Right IJ line in place with tip passing through the right atrium and into the right ventricle. Recommend retracting to the cavoatrial junction for optimal radiographic positioning. These results and recommendations were called by telephone at the time of interpretation on 11/10/2016 at 7:35 pm to Dr. Katrinka Blazing , who verbally acknowledged these results. Electronically Signed   By: Bary Richard M.D.   On: 11/10/2016 19:36   Ct Abdomen Pelvis W Contrast  Result Date: 11/14/2016 CLINICAL DATA:  Right upper quadrant pain times 2-3 days. Polysubstance abuse. Cholecystectomy. History  of endocarditis and septic emboli. EXAM: CT ABDOMEN AND PELVIS WITH CONTRAST TECHNIQUE: Multidetector CT imaging of the abdomen and pelvis was performed using the standard protocol following bolus administration of intravenous contrast. CONTRAST:  80mL ISOVUE-300 IOPAMIDOL (ISOVUE-300) INJECTION 61% COMPARISON:  03/12/2016 chest CT FINDINGS: Lower chest: Small right effusion with several bilateral cavitary opacities in both lower lobes concerning for septic emboli or cavitary pneumonia. More confluent airspace opacity in the left lower lobe is also noted. The visualized heart is normal in  size. There is no pericardial effusion. No large central pulmonary embolus. Hepatobiliary: Normal appearance of the liver without space-occupying mass or biliary dilatation. Cholecystectomy. Pancreas: No pancreatic mass or ductal dilatation. Mild enlargement of the pancreatic head with surrounding peripancreatic fluid is noted. Correlate to exclude a pancreatitis. Spleen: The spleen is enlarged measuring 15.7 x 5.6 x 17 cm (volume = 780 cm^3) and demonstrates a developmental cleft, partially visualized on prior chest CT and therefore believed less likely to represent a laceration. Adrenals/Urinary Tract: Adrenal glands are unremarkable. Kidneys are normal, without renal calculi, solid appearing lesions, or hydronephrosis. There appear to be pair of water attenuating cysts in the lower pole the right kidney measuring up to 2 x 1.3 cm in aggregate. Bladder is unremarkable. Stomach/Bowel: Stomach is within normal limits. Appendix appears normal. No evidence of bowel wall thickening, distention, or inflammatory changes. Vascular/Lymphatic: No significant vascular findings are present. No enlarged abdominal or pelvic lymph nodes. Reproductive: Uterus and bilateral adnexa are unremarkable. Other: Moderate volume of ascites.  Anasarca. Musculoskeletal: No acute or significant osseous findings. IMPRESSION: 1. Bibasilar, multifocal cavitary  lesions noted within the visualized lower lobes consistent with septic emboli or possibly cavitary pneumonia. More confluent airspace disease in the left lower lobe without cavitation is also present. There is a small right pleural effusion. 2. Anasarca with moderate volume of ascites noted within the abdomen and pelvis. Question right heart dysfunction/failure. 3. Splenomegaly. 4. Water attenuating cysts in the lower pole the right kidney in aggregate measuring 2 x 1.3 cm. 5. Status post cholecystectomy. Electronically Signed   By: Tollie Eth M.D.   On: 11/14/2016 16:26   Dg Chest 1v Repeat Same Day  Result Date: 11/18/2016 CLINICAL DATA:  Chest tube placement EXAM: CHEST - 1 VIEW SAME DAY COMPARISON:  Earlier same day FINDINGS: Large bore chest tube placed on the right along the lateral margin. Marked reduction an amount of right pleural air, nearly completely evacuated. Patchy infiltrates persist in both lower lobes. IMPRESSION: Large bore chest tube placed. Near complete resolution of right pneumothorax. Electronically Signed   By: Paulina Fusi M.D.   On: 11/18/2016 11:40   Dg Chest Port 1 View  Result Date: 11/20/2016 CLINICAL DATA:  26 year old female with pneumothorax. Recent transesophageal echocardiogram. Subsequent encounter. EXAM: PORTABLE CHEST 1 VIEW COMPARISON:  11/20/2016 8:30 a.m. FINDINGS: Right-sided chest tube remains in place. No pneumothorax detected on this frontal projection. Cavitary lesions bilaterally. Left base consolidation. Atelectasis/pleural thickening right lung base. Cardiomegaly. IMPRESSION: No significant change since exam earlier today. Electronically Signed   By: Lacy Duverney M.D.   On: 11/20/2016 13:55   Dg Chest Port 1 View  Result Date: 11/20/2016 CLINICAL DATA:  Chest tube, shortness of Breath EXAM: PORTABLE CHEST 1 VIEW COMPARISON:  11/19/2016 FINDINGS: Right chest tube remains in place, unchanged. No pneumothorax. Patchy bilateral airspace opacities are again  noted, some with cavitation. Small right pleural effusion. Heart is borderline in size. IMPRESSION: Right chest tube remains in place without pneumothorax. Stable patchy bilateral airspace disease, some of which is cavitary. Electronically Signed   By: Charlett Nose M.D.   On: 11/20/2016 08:42   Dg Chest Port 1 View  Result Date: 11/19/2016 CLINICAL DATA:  26 year old female with cavitary lung lesions possibly from septic endocarditis. Subsequent encounter. EXAM: PORTABLE CHEST 1 VIEW COMPARISON:  11/18/2016. FINDINGS: Right-sided chest tube remains in place. Tiny hydropneumothorax versus fluid within minor fissure noted. Multiple cavitary lesions. Right-sided pleural effusion. Basilar atelectasis versus infiltrate greater on the  left. Cardiomegaly. Mild scoliosis. IMPRESSION: Right-sided chest tube remains in place. Tiny hydropneumothorax versus fluid within minor fissure. Multiple cavitary lesions. Right-sided pleural effusion. Basilar atelectasis versus infiltrate greater on the left. Electronically Signed   By: Lacy Duverney M.D.   On: 11/19/2016 07:16   Dg Chest Port 1 View  Result Date: 11/18/2016 CLINICAL DATA:  Followup left pneumothorax. Patient with bacterial endocarditis. EXAM: PORTABLE CHEST 1 VIEW COMPARISON:  11/17/2016 and prior studies FINDINGS: A moderate right hydropneumothorax is unchanged in size, but now with pleural fluid in its basilar portion. A right thoracostomy tube is unchanged. Scattered pulmonary opacities/ nodules and left lower lung consolidations/atelectasis again noted. There has been no other interval change. IMPRESSION: Moderate right hydropneumothorax, unchanged in size but now with pleural fluid in its basilar portion. Right thoracostomy tube remains unchanged. Electronically Signed   By: Harmon Pier M.D.   On: 11/18/2016 10:14   Dg Chest Port 1 View  Result Date: 11/17/2016 CLINICAL DATA:  Follow-up right-sided chest tube placement. Initial encounter. EXAM: PORTABLE  CHEST 1 VIEW COMPARISON:  Chest radiograph performed 11/16/2016 FINDINGS: The patient's moderate right-sided pneumothorax has decreased in size. The right-sided chest tube is grossly unchanged in appearance. Vascular congestion is noted. Patchy left-sided airspace opacities raise concern for pneumonia. Right-sided airspace opacity may reflect atelectasis. A small left pleural effusion is noted. The cardiomediastinal silhouette is mildly enlarged. No acute osseous abnormalities are identified. IMPRESSION: 1. Moderate right-sided pneumothorax has decreased in size. Right-sided chest tube is grossly unchanged in appearance. 2. Vascular congestion and mild cardiomegaly. Patchy left-sided airspace opacities raise concern for pneumonia. Small left pleural effusion noted. 3. Right-sided airspace opacity may reflect atelectasis. Electronically Signed   By: Roanna Raider M.D.   On: 11/17/2016 00:33   Dg Chest Port 1 View  Result Date: 11/16/2016 CLINICAL DATA:  Chest tube placement for right pneumothorax. EXAM: PORTABLE CHEST 1 VIEW COMPARISON:  Single-view of the chest earlier today and 11/11/2016. FINDINGS: Pigtail catheter is now in place in the right chest. Large right pneumothorax is unchanged. Patchy airspace disease in cavitary lesions in the left chest persist. Heart size is normal. IMPRESSION: No change in large right pneumothorax after chest tube placement. No change in patchy airspace disease in cavitary lesions on the left. These results were called by telephone at the time of interpretation on 11/16/2016 at 8:10 pm to Estrella Deeds, RN, who verbally acknowledged these results. Electronically Signed   By: Drusilla Kanner M.D.   On: 11/16/2016 20:12   Dg Chest Port 1 View  Result Date: 11/16/2016 CLINICAL DATA:  Shortness of Breath EXAM: PORTABLE CHEST 1 VIEW COMPARISON:  11/11/2016 FINDINGS: Cardiac shadow is within normal limits. Cavitary nodular densities are noted throughout both lungs similar to that  seen on previous CT examination. A new right-sided moderately large pneumothorax is noted with consolidation of the lower lobe on the right. No acute bony abnormality is noted. IMPRESSION: Moderately large right-sided pneumothorax new from the prior exam. Near complete collapse of the right lower lobe is noted. Stable cavitary lesions within both lungs. Critical Value/emergent results were called by telephone at the time of interpretation on 11/16/2016 at 6:24 pm to Dr Leitha Bleak, who verbally acknowledged these results. Electronically Signed   By: Alcide Clever M.D.   On: 11/16/2016 18:27   Dg Chest Port 1 View  Result Date: 11/11/2016 CLINICAL DATA:  Check right IJ line placement.  Initial encounter. EXAM: PORTABLE CHEST 1 VIEW COMPARISON:  Chest radiograph performed 11/10/2016 FINDINGS:  The right IJ line is noted ending at the right atrium. This could be retracted 2-3 cm. Mild vascular congestion is noted. Mild left-sided opacities may reflect atelectasis or mild pneumonia. No pleural effusion or pneumothorax is seen. The cardiomediastinal silhouette is borderline normal in size. No acute osseous abnormalities are identified. IMPRESSION: 1. Right IJ line noted ending at the right atrium. This could be retracted 2-3 cm, as deemed clinically appropriate. 2. Mild vascular congestion noted. 3. Mild left-sided airspace opacities may reflect atelectasis or mild pneumonia. Electronically Signed   By: Roanna Raider M.D.   On: 11/11/2016 00:37   Dg Chest Port 1 View  Result Date: 11/10/2016 CLINICAL DATA:  Retraction of the IJ line about 10 cm EXAM: PORTABLE CHEST 1 VIEW COMPARISON:  CT chest 11/10/2016.  Chest 11/10/2016. FINDINGS: The left central venous catheter tip localizes to the mid/distal right atrial region. If placement at or above the cavoatrial junction is desired, retraction of about 3-4 cm is recommended. No pneumothorax. Shallow inspiration with atelectasis in the lung bases. Borderline heart size.  Patchy airspace infiltrates in both lungs could indicate edema, aspiration, or pneumonia. No blunting of costophrenic angles. IMPRESSION: Central venous catheter tip is in the mid/ low right atrial region. If placement at or above the cavoatrial junction is desired, retraction of about 3-4 cm is recommended. Electronically Signed   By: Burman Nieves M.D.   On: 11/10/2016 21:35   Dg Chest Portable 1 View  Result Date: 11/10/2016 CLINICAL DATA:  Status post central line placement. EXAM: PORTABLE CHEST 1 VIEW COMPARISON:  Chest x-ray from earlier same day. FINDINGS: Interval placement of a right IJ central line with tip passing to the left of midline and likely in the right ventricle or main pulmonary artery. The bilateral small nodular airspace opacities throughout both lungs are unchanged in the short-term interval. Suspect small bilateral pleural effusions. No pneumothorax seen. IMPRESSION: 1. Right IJ central line placement with tip positioned to the left of midline either within the right ventricle or main pulmonary artery. Recommend retracting at least 10 cm. 2. Bilateral small nodular airspace opacities throughout both lungs are unchanged in the short-term interval. As recommended on earlier chest x-ray report, would consider chest CT for further characterization. 3. Suspect small bilateral pleural effusions. Electronically Signed   By: Bary Richard M.D.   On: 11/10/2016 18:37    Lab Data:  CBC:  Recent Labs Lab 11/20/16 0239 11/21/16 0256 11/22/16 0231 11/24/16 0606 11/25/16 0445  WBC 9.4 8.8 7.4 5.4 5.6  HGB 7.1* 8.2* 8.5* 7.5* 7.1*  HCT 22.6* 25.7* 26.3* 23.8* 22.7*  MCV 85.9 85.4 85.7 86.5 88.3  PLT 398 411* 407* 360 332   Basic Metabolic Panel:  Recent Labs Lab 11/19/16 0414  11/20/16 0239 11/21/16 0256 11/22/16 0231 11/23/16 0254 11/24/16 0606 11/25/16 0445  NA 139  < > 137 136 136  --  139 138  K 3.9  < > 4.0 3.7 3.7  --  3.4* 3.8  CL 109  < > 109 106 105  --  107  107  CO2 23  < > 22 22 22   --  24 26  GLUCOSE 97  < > 81 93 88  --  89 97  BUN 15  < > 10 9 9   --  7 10  CREATININE 1.37*  < > 0.93 0.95 0.84 0.78 0.79 0.80  CALCIUM 7.9*  < > 7.9* 7.9* 8.0*  --  7.9* 8.3*  MG 2.2  --  1.7 1.7  --   --   --   --   PHOS 6.0*  --  4.9* 4.8*  --   --   --   --   < > = values in this interval not displayed. GFR: Estimated Creatinine Clearance: 95.9 mL/min (by C-G formula based on SCr of 0.8 mg/dL). Liver Function Tests:  Recent Labs Lab 11/21/16 0256  AST 13*  ALT 8*  ALKPHOS 151*  BILITOT 0.4  PROT 5.6*  ALBUMIN 1.5*   No results for input(s): LIPASE, AMYLASE in the last 168 hours. No results for input(s): AMMONIA in the last 168 hours. Coagulation Profile: No results for input(s): INR, PROTIME in the last 168 hours. Cardiac Enzymes: No results for input(s): CKTOTAL, CKMB, CKMBINDEX, TROPONINI in the last 168 hours. BNP (last 3 results) No results for input(s): PROBNP in the last 8760 hours. HbA1C: No results for input(s): HGBA1C in the last 72 hours. CBG:  Recent Labs Lab 11/19/16 1223  GLUCAP 83   Lipid Profile: No results for input(s): CHOL, HDL, LDLCALC, TRIG, CHOLHDL, LDLDIRECT in the last 72 hours. Thyroid Function Tests: No results for input(s): TSH, T4TOTAL, FREET4, T3FREE, THYROIDAB in the last 72 hours. Anemia Panel: No results for input(s): VITAMINB12, FOLATE, FERRITIN, TIBC, IRON, RETICCTPCT in the last 72 hours. Urine analysis:    Component Value Date/Time   COLORURINE YELLOW 11/10/2016 2132   APPEARANCEUR CLEAR 11/10/2016 2132   LABSPEC 1.006 11/10/2016 2132   PHURINE 5.0 11/10/2016 2132   GLUCOSEU NEGATIVE 11/10/2016 2132   HGBUR MODERATE (A) 11/10/2016 2132   BILIRUBINUR NEGATIVE 11/10/2016 2132   KETONESUR NEGATIVE 11/10/2016 2132   PROTEINUR NEGATIVE 11/10/2016 2132   UROBILINOGEN 0.2 08/05/2010 1821   NITRITE NEGATIVE 11/10/2016 2132   LEUKOCYTESUR SMALL (A) 11/10/2016 2132     Matin Mattioli M.D. [facility] 11/25/2016, 11:16 AM  Pager: [phone] Between 7am to 7pm - call Pager - [phone]  After 7pm go to [email or web address] - password [email or web address]  Call night coverage person covering after 7pm

## 2016-11-26 DIAGNOSIS — Z5181 Encounter for therapeutic drug level monitoring: Secondary | ICD-10-CM

## 2016-11-26 LAB — BASIC METABOLIC PANEL
Anion gap: 8 (ref 5–15)
BUN: 10 mg/dL (ref 6–20)
CO2: 24 mmol/L (ref 22–32)
CREATININE: 0.92 mg/dL (ref 0.44–1.00)
Calcium: 8.3 mg/dL — ABNORMAL LOW (ref 8.9–10.3)
Chloride: 107 mmol/L (ref 101–111)
GFR calc Af Amer: >60 mL/min (ref >60)
GFR calc non Af Amer: >60 mL/min (ref >60)
GLUCOSE: 86 mg/dL (ref 65–99)
Potassium: 3.6 mmol/L (ref 3.5–5.1)
Sodium: 139 mmol/L (ref 135–145)

## 2016-11-26 LAB — CBC
HEMATOCRIT: 29.2 % — AB (ref 36.0–46.0)
HEMOGLOBIN: 9.5 g/dL — AB (ref 12.0–15.0)
MCH: 28.5 pg (ref 26.0–34.0)
MCHC: 32.5 g/dL (ref 30.0–36.0)
MCV: 87.7 fL (ref 78.0–100.0)
Platelets: 326 10*3/uL (ref 150–400)
RBC: 3.33 MIL/uL — ABNORMAL LOW (ref 3.87–5.11)
RDW: 15.7 % — ABNORMAL HIGH (ref 11.5–15.5)
WBC: 5.5 10*3/uL (ref 4.0–10.5)

## 2016-11-26 LAB — TYPE AND SCREEN
ABO/RH(D): O POS
ANTIBODY SCREEN: NEGATIVE
Unit division: 0
Unit division: 0

## 2016-11-26 LAB — BPAM RBC
BLOOD PRODUCT EXPIRATION DATE: 201807022359
Blood Product Expiration Date: 201807012359
ISSUE DATE / TIME: 201806101406
ISSUE DATE / TIME: 201806101934
UNIT TYPE AND RH: 5100
Unit Type and Rh: 5100

## 2016-11-26 LAB — CREATININE, SERUM
Creatinine, Ser: 0.95 mg/dL (ref 0.44–1.00)
GFR calc non Af Amer: >60 mL/min (ref >60)

## 2016-11-26 LAB — VANCOMYCIN, TROUGH: Vancomycin Tr: 38 ug/mL (ref 15–20)

## 2016-11-26 LAB — VANCOMYCIN, RANDOM: Vancomycin Rm: 19

## 2016-11-26 MED ORDER — VANCOMYCIN HCL IN DEXTROSE 1-5 GM/200ML-% IV SOLN
1000.0000 mg | Freq: Two times a day (BID) | INTRAVENOUS | Status: DC
  Administered 2016-11-26 – 2016-11-28 (×5): 1000 mg via INTRAVENOUS
  Filled 2016-11-26 (×6): qty 200

## 2016-11-26 NOTE — Progress Notes (Signed)
CRITICAL VALUE ALERT  Critical Value:  vanc trough 38  Date & Time Notied:  11/26/16 @ 07:00  Provider Notified: pharmacy  Orders Received/Actions taken: hold vanc

## 2016-11-26 NOTE — Progress Notes (Signed)
[facility]                                                                              Patient Demographics  Savannah Palmer, is a 26 y.o. female, DOB - 11-26-1990, ZOX:[id number]  Admit date - 11/10/2016   Admitting Physician Coralyn Helling, MD  Outpatient Primary MD for the patient is Patient, No Pcp Per  Outpatient specialists:   LOS - 16  days   Medical records reviewed and are as summarized below:    No chief complaint on file.      Brief summary    26 year old Caucasian female with a past medical history of anxiety, depression, heroin abuse, presented with weakness, cough and chest pain. Initially was in septic shock and required pressors. Blood cultures grew MRSA. 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 She was placed on IV antibiotics. She also developed a spontaneous right-sided pneumothorax. Chest tube was placed by critical care medicine. She was transferred to [facility] for opinion from cardiothoracic surgery. Patient is not a candidate for surgery at this time.   Assessment & Plan    Principal Problem: MRSA bacteremia/Septic shock with right TV endocarditis and septic emboli to lungs/MRSA empyema - Patient presented with weakness, coughing, chest pain and septic shock requiring vasopressors. Blood cultures 5/27 grew out MRSA and was found to have septic emboli throughout her lungs.  -Repeat cultures were still positive, blood cultures repeat on 6/4 has been negative to date  - 2-D echo on 5/27 showed tricuspid vegetations with moderate TR. TEE on 6/1 showed large tricuspid vegetation, severe TR with dilated RV along with a loculated left effusion -HIV, Hep C negative. Due to family request, RPR was done and it was negative. - Due to persistent bacteremia patient was started on daptomycin 6/2 and Ceftaroline was continued to cover lung infection. - cardiothoracic  surgery was also consulted and patient was deemed not a candidate for surgery at this time.  - Per ID recommendations today, DC Ceftaroline and daptomycin, placed on vancomycin duration through July 15, will need BMP, CBC weekly, trough goal of 15-20.  - PICC line placed on 6/9, continue vancomycin, until July 15. Vanc trough elevated 38, pharmacy to hold dose and adjust further vancomycin dosing -  Pending skilled nursing facility placement   Active problems: Right spontaneous pneumothorax- acute respiratory failure/MRSA Empyema -Right empyema with pneumothorax, positive for MRSA. Status post chest tube. - Chest tube removed on 6/7, chest x-ray clear   Acute kidney injury -Baseline creatinine 0.7, creatinine increased to 1.37, likely secondary to dehydration and NSAIDs. - patient received IV fluids, NSAIDs were discontinued, and creatinine function has normalized.   RUQ pain, likely musculoskeletal -CT abdomen and pelvis showed no liver or gallbladder abnormalities, possible musculoskeletal origin, currently stable.    Ascites, anasarca due to right sided hear failure - as a result of severe TR, net positive balance of 13.8L - O2 sats 96% on room air  Splenomegaly - etiology undetermined- CT on 5/30 revealed that the spleen is enlarged measuring 15.7 x 5.6 x 17  cm (volume= 780 cm^3) - liver is unremarkable on CT  Normocytic Anemia - H&H currently stable, was transfused on 6/5, no overt bleeding noted - Iron was 12. Ferritin 213. B-12 1850. Folate 7.8. - Hemoglobin down to 7.1 on 6/10, patient was transfused 2 units packed RBC with 1 dose of IV Feraheme - H&H stable, 9.5 today  Hypokalemia/Hypophosphatemia/Hypomagnesemia - Potassium stable today  Thrombocytopenia - Resolved  Heroin abuse - cont Suboxone, will need transition to a drug rehab program once stable.   Anxiety - Patient and mother requested to discontinue Celexa, BuSpar and continue only Neurontin and  Xanax as needed at this time.   Nicotine dependence -Continue  Nicoderm patch   Code Status: Full CODE STATUS  DVT Prophylaxis:  heparin  Family Communication: Discussed in detail with the patient, all imaging results, lab results explained to the patient and patient's mother at the bedside   Disposition Plan: Hopefully DC to skilled nursing facility when a bed is available  Time Spent in minutes  25 minutes  Procedures:  TEE  - Left ventricle: The cavity size was normal. Wall thickness was normal. Systolic function was normal. The estimated ejection fraction was in the range of 55% to 60%. Wall motion was normal; there were no regional wall motion abnormalities. - Left atrium: No evidence of thrombus in the atrial cavity or appendage. - Right ventricle: The cavity size was mildly dilated. Wall thickness was normal. - Right atrium: No evidence of thrombus in the atrial cavity or appendage. - Tricuspid valve: Flail motion of the posterior leaflet. There is a 8x5 mm vegetation attached to the posterior leaflet, but most of the &quot;mass&quot; described on transthoracic imaging appears to be the flail segment of the valve. There was severe regurgitation directed eccentrically and toward the free wall.  Right chest tube placement for pneumothorax 6/1  Placement of a larger bore chest tube in the right chest 6/3  Consultants:   Infectious disease Cardiothoracic surgery  Antimicrobials:   Daptomycin 6/2> 6/8  Teflaro 5/31> 6/8  Vancomycin 6/8>> will need to July 15   Medications  Scheduled Meds: . acetaminophen  1,000 mg Oral Q6H  . buprenorphine-naloxone  2 tablet Sublingual Daily  . gabapentin  300 mg Oral TID  . heparin  5,000 Units Subcutaneous Q8H  . nicotine  14 mg Transdermal Daily  . saccharomyces boulardii  250 mg Oral BID   Continuous Infusions: . sodium chloride 250 mL (11/19/16 0700)   PRN Meds:.sodium chloride, albuterol,  ALPRAZolam, docusate sodium, ibuprofen, nicotine polacrilex, ondansetron (ZOFRAN) IV, oxyCODONE, sodium chloride flush   Antibiotics   Anti-infectives    Start     Dose/Rate Route Frequency Ordered Stop   11/24/16 0000  vancomycin (VANCOCIN) IVPB 1000 mg/200 mL premix  Status:  Discontinued     1,000 mg 200 mL/hr over 60 Minutes Intravenous Every 8 hours 11/23/16 1238 11/26/16 0721   11/23/16 1600  vancomycin (VANCOCIN) 1,500 mg in sodium chloride 0.9 % 500 mL IVPB     1,500 mg 250 mL/hr over 120 Minutes Intravenous  Once 11/23/16 1238 11/23/16 1733   11/17/16 1600  DAPTOmycin (CUBICIN) 500 mg in sodium chloride 0.9 % IVPB  Status:  Discontinued     500 mg 220 mL/hr over 30 Minutes Intravenous Every 24 hours 11/17/16 1432 11/23/16 1238   11/17/16 1500  ceftaroline (TEFLARO) 600 mg in sodium chloride 0.9 % 250 mL IVPB  Status:  Discontinued     600 mg 250 mL/hr over 60  Minutes Intravenous Every 12 hours 11/17/16 1435 11/23/16 1238   11/15/16 1800  ceftaroline (TEFLARO) 600 mg in sodium chloride 0.9 % 250 mL IVPB  Status:  Discontinued     600 mg 250 mL/hr over 60 Minutes Intravenous Every 12 hours 11/15/16 1635 11/17/16 1431   11/13/16 1215  vancomycin (VANCOCIN) IVPB 1000 mg/200 mL premix  Status:  Discontinued     1,000 mg 200 mL/hr over 60 Minutes Intravenous Every 8 hours 11/13/16 1203 11/16/16 0850   11/11/16 1100  vancomycin (VANCOCIN) IVPB 750 mg/150 ml premix  Status:  Discontinued     750 mg 150 mL/hr over 60 Minutes Intravenous Every 8 hours 11/11/16 0956 11/13/16 1203   11/11/16 0400  vancomycin (VANCOCIN) 500 mg in sodium chloride 0.9 % 100 mL IVPB  Status:  Discontinued     500 mg 100 mL/hr over 60 Minutes Intravenous Every 12 hours 11/10/16 1951 11/11/16 0956   11/10/16 2200  ceFEPIme (MAXIPIME) 1 g in dextrose 5 % 50 mL IVPB  Status:  Discontinued     1 g 100 mL/hr over 30 Minutes Intravenous Every 12 hours 11/10/16 1951 11/11/16 0926   11/10/16 2000  ceFEPIme  (MAXIPIME) 2 g in dextrose 5 % 50 mL IVPB  Status:  Discontinued     2 g 100 mL/hr over 30 Minutes Intravenous  Once 11/10/16 1952 11/10/16 1954   11/10/16 2000  vancomycin (VANCOCIN) IVPB 1000 mg/200 mL premix  Status:  Discontinued     1,000 mg 200 mL/hr over 60 Minutes Intravenous  Once 11/10/16 1952 11/10/16 1954   11/10/16 1430  piperacillin-tazobactam (ZOSYN) IVPB 3.375 g     3.375 g 100 mL/hr over 30 Minutes Intravenous  Once 11/10/16 1426 11/10/16 1503   11/10/16 1430  vancomycin (VANCOCIN) IVPB 1000 mg/200 mL premix     1,000 mg 200 mL/hr over 60 Minutes Intravenous  Once 11/10/16 1426 11/10/16 1653        Subjective:   Savannah Palmer was seen and examined today. No complaints except wants changes in the medications. Patient and mother wants to stop Celexa and BuSpar. No fevers or chills. Patient denies dizziness,  shortness of breath, abdominal pain, N/V/D/C, new weakness, numbess, tingling. No acute events overnight.    Objective:   Vitals:   11/25/16 2207 11/25/16 2310 11/26/16 0500 11/26/16 0602  BP: 130/68 120/80  122/82  Pulse: 70 70  61  Resp: 18 16  18   Temp: 99.2 F (37.3 C) 99.4 F (37.4 C)  98.8 F (37.1 C)  TempSrc: Oral Oral  Oral  SpO2: 95%   96%  Weight:   62.3 kg (137 lb 6.4 oz)   Height:        Intake/Output Summary (Last 24 hours) at 11/26/16 1313 Last data filed at 11/26/16 1000  Gross per 24 hour  Intake          2223.66 ml  Output                0 ml  Net          2223.66 ml     Wt Readings from Last 3 Encounters:  11/26/16 62.3 kg (137 lb 6.4 oz)  03/14/16 58.5 kg (129 lb)  01/19/16 58.1 kg (128 lb)     Exam  General: Alert and oriented x 3, NAD  Eyes: EOMI PERRLA  HEENT: atraumatic, normocephalic  Cardiovascular: S1 and S2 clear, regular rate and rhythm, systolic murmur in the tricuspid area  Respiratory: Clear to auscultation bilaterally  Gastrointestinal: Soft, nontender, nondistended, normal bowel sounds  Ext:  no pedal edema b/l  Neuro: no focal neurological deficits  Musculoskeletal: No cyanosis, clubbing  Skin: No rashes  Psych: alert and oriented 3, normal affect   Data Reviewed:  I have personally reviewed following labs and imaging studies  Micro Results Recent Results (from the past 240 hour(s))  MRSA PCR Screening     Status: None   Collection Time: 11/16/16  5:49 PM  Result Value Ref Range Status   MRSA by PCR NEGATIVE NEGATIVE Final    Comment:        The GeneXpert MRSA Assay (FDA approved for NASAL specimens only), is one component of a comprehensive MRSA colonization surveillance program. It is not intended to diagnose MRSA infection nor to guide or monitor treatment for MRSA infections. DELTA CHECK NOTED   Body fluid culture (includes gram stain)     Status: None   Collection Time: 11/16/16  8:06 PM  Result Value Ref Range Status   Specimen Description PLEURAL FLUID  Final   Special Requests NONE  Final   Gram Stain   Final    FEW WBC PRESENT, PREDOMINANTLY PMN NO ORGANISMS SEEN    Culture   Final    FEW METHICILLIN RESISTANT STAPHYLOCOCCUS AUREUS RESULT CALLED TO, READ BACK BY AND VERIFIED WITH: R. MYRICK RN, AT 1023 11/18/16 BY D. VANHOOK REGARDING CULTURE GROWTH INTERPRET RESULTS WITH CAUTION DUE TO LIMITED SPECIMEN VOLUME Performed at [facility], [address] [place], [place] [address]    Report Status 11/20/2016 FINAL  Final   Organism ID, Bacteria METHICILLIN RESISTANT STAPHYLOCOCCUS AUREUS  Final      Susceptibility   Methicillin resistant staphylococcus aureus - MIC*    CIPROFLOXACIN >=8 RESISTANT Resistant     ERYTHROMYCIN >=8 RESISTANT Resistant     GENTAMICIN <=0.5 SENSITIVE Sensitive     OXACILLIN >=4 RESISTANT Resistant     TETRACYCLINE <=1 SENSITIVE Sensitive     VANCOMYCIN 1 SENSITIVE Sensitive     TRIMETH/SULFA <=10 SENSITIVE Sensitive     CLINDAMYCIN <=0.25 SENSITIVE Sensitive     RIFAMPIN <=0.5 SENSITIVE Sensitive      Inducible Clindamycin NEGATIVE Sensitive     * FEW METHICILLIN RESISTANT STAPHYLOCOCCUS AUREUS  Culture, blood (routine x 2)     Status: None   Collection Time: 11/19/16  8:57 AM  Result Value Ref Range Status   Specimen Description BLOOD RIGHT ARM  Final   Special Requests IN PEDIATRIC BOTTLE Blood Culture adequate volume  Final   Culture NO GROWTH 5 DAYS  Final   Report Status 11/24/2016 FINAL  Final  Culture, blood (routine x 2)     Status: None   Collection Time: 11/19/16  9:05 AM  Result Value Ref Range Status   Specimen Description BLOOD RIGHT ANTECUBITAL  Final   Special Requests IN PEDIATRIC BOTTLE Blood Culture adequate volume  Final   Culture NO GROWTH 5 DAYS  Final   Report Status 11/24/2016 FINAL  Final    Radiology Reports Dg Chest 2 View  Result Date: 11/23/2016 CLINICAL DATA:  Chest tube removal EXAM: CHEST  2 VIEW COMPARISON:  11/21/2016 FINDINGS: Interval removal of right chest tube with residual small right pleural effusion versus pleural thickening. No pneumothorax. Patchy bilateral airspace opacities are again noted, unchanged. Heart is borderline in size. IMPRESSION: Interval removal of right chest tube without pneumothorax. Otherwise no change. Electronically Signed   By: Caryn Bee  Dover M.D.   On: 11/23/2016 08:44   Dg Chest 2 View  Result Date: 11/21/2016 CLINICAL DATA:  Chest soreness.  Chest tube . EXAM: CHEST  2 VIEW COMPARISON:  CT 11/20/2016.  Chest x-ray 11/20/2016 FINDINGS: Chest tube noted on the right. Tiny right anterior hydropneumothorax again noted. No interim change . Stable cardiomegaly. Persistent bilateral pulmonary nodules including cavitary nodules. Reference is made to prior CT report. Small right pleural effusion. No pneumothorax. IMPRESSION: 1. Right chest tube in stable position. Tiny right anterior hydropneumothorax again noted. No interim change. 2. Multiple bilateral pulmonary nodules with cavitation. Reference is made to prior CT report of  11/20/2016 . Electronically Signed   By: Maisie Fus  Register   On: 11/21/2016 11:15   Dg Chest 2 View  Result Date: 11/19/2016 CLINICAL DATA:  Pneumothorax. EXAM: CHEST  2 VIEW COMPARISON:  11/19/2016. FINDINGS: Right chest tube in stable position. No significant pneumothorax noted on today's exam. Small right pleural effusion. Bilateral patchy pulmonary infiltrates with cavitation again noted. Small left pleural effusion noted. Heart size stable. No acute bony abnormality . IMPRESSION: 1. Right chest tube in stable position. No pneumothorax noted on today's exam. Small right pleural effusion. 2. Persistent bilateral cavitary pulmonary infiltrates. Small left pleural effusion. Electronically Signed   By: Maisie Fus  Register   On: 11/19/2016 16:53   Dg Chest 2 View  Result Date: 11/10/2016 CLINICAL DATA:  Chest pain, shortness of breath for 1 week EXAM: CHEST  2 VIEW COMPARISON:  CT chest 03/12/2016, chest x-ray 03/12/2016 FINDINGS: There are bilateral nodular airspace opacities throughout the upper and lower lobes bilaterally. There is no pleural effusion or pneumothorax. The heart and mediastinal contours are unremarkable. The osseous structures are unremarkable. IMPRESSION: New bilateral nodular airspace opacities throughout the upper and lower lobes bilaterally. Findings are concerning for multifocal infection such as septic emboli. Recommend further evaluation with a CT of the chest with intravenous contrast. Electronically Signed   By: Elige Ko   On: 11/10/2016 13:23   Ct Chest Wo Contrast  Result Date: 11/20/2016 CLINICAL DATA:  Follow-up right pleural effusion EXAM: CT CHEST WITHOUT CONTRAST TECHNIQUE: Multidetector CT imaging of the chest was performed following the standard protocol without IV contrast. COMPARISON:  Radiograph 11/20/2016, CT chest 11/10/2016, prior radiographs dating back to 11/10/2016 FINDINGS: Cardiovascular: Limited assessment without intravenous contrast. Small fluid in the  superior pericardial recess. There is cardiomegaly. Mediastinum/Nodes: Difficult evaluation without contrast. Mediastinal adenopathy is present right paratracheal soft tissue fullness, possible adenopathy does not appear significantly changed. Midline trachea. Esophagus grossly unremarkable. Lungs/Pleura: Interim placement of a right lower lateral chest tube since the prior CT with the tip terminating along the right upper posterior pleural surface. Small bilateral pleural effusions right greater than left, slightly increased compared to the previous CT. Tiny right anterior hydropneumothorax. Multiple bilateral pulmonary nodules and cavitary lesions. Some of the cavitary lesions have decreased, however if there appears to be increased nodules/disease most notable in the left upper lobe and the right lower lobe. Decreased septal thickening compared to the previous exam. Upper Abdomen: Spleen appears enlarged. Partially visualized surgical clips in the gallbladder fossa. Musculoskeletal: No acute or suspicious bone lesion. IMPRESSION: 1. Interim placement of right-sided chest tube. Small residual pleural effusions right greater than left, mildly increased compared to the prior chest CT. Small right anterior hydropneumothorax. 2. Innumerable bilateral lung nodules and cavitary lesions, suspected to represent septic emboli. This appears increased in the left upper and right lower lobe since the prior CT.  3. Suspect mediastinal adenopathy although evaluation of the mediastinum is limited without contrast 4. Splenomegaly Electronically Signed   By: Jasmine Pang M.D.   On: 11/20/2016 21:25   Ct Chest Wo Contrast  Result Date: 11/10/2016 CLINICAL DATA:  She c/o some chest discomfrot and shortness of breath x 1 week. She states these are similar s/s she had a year ago, at which time she was dx with "pulmonary abscess". She is in no distress. IV drug user EXAM: CT CHEST WITHOUT CONTRAST TECHNIQUE: Multidetector CT  imaging of the chest was performed following the standard protocol without IV contrast. COMPARISON:  Chest CT angiogram dated 03/12/2016. FINDINGS: Cardiovascular: Probable small pericardial effusion and/or pericardial thickening, difficult to characterize without IV contrast. Heart size is within normal limits. Thoracic aorta appears to be normal in caliber. Mediastinum/Nodes: Soft tissue density material within the right upper peritracheal space, also difficult to characterize without IV contrast, presumed lymphadenopathy. Additional mild lymphadenopathy within the anterior mediastinum and aortopulmonary window region. Lungs/Pleura: Numerous cavitary consolidations throughout both lungs, peripherally distributed suggesting septic emboli. More confluent consolidations at each lung base, more likely atelectasis than pneumonia. Small right pleural effusion. Upper Abdomen: Suspected splenomegaly, incompletely imaged. Musculoskeletal: No acute or suspicious osseous finding. IMPRESSION: 1. Numerous cavitary consolidations throughout both lungs, with a peripheral distribution suggesting septic emboli. Largest cavitary consolidation is within the left lower lobe measuring approximately 4 cm greatest dimension. 2. Probable small pericardial effusion and/or pericardial wall thickening, difficult to delineate without IV contrast. 3. Probable mediastinal lymphadenopathy, again difficult to definitively characterize without IV contrast, alternatively confluent fluid or edema within the mediastinum. 4. Small right pleural effusion. 5. Probable splenomegaly, incompletely imaged at the lower aspects of this chest CT. 6. Right IJ line in place with tip passing through the right atrium and into the right ventricle. Recommend retracting to the cavoatrial junction for optimal radiographic positioning. These results and recommendations were called by telephone at the time of interpretation on 11/10/2016 at 7:35 pm to Dr. Katrinka Blazing , who  verbally acknowledged these results. Electronically Signed   By: Bary Richard M.D.   On: 11/10/2016 19:36   Ct Abdomen Pelvis W Contrast  Result Date: 11/14/2016 CLINICAL DATA:  Right upper quadrant pain times 2-3 days. Polysubstance abuse. Cholecystectomy. History of endocarditis and septic emboli. EXAM: CT ABDOMEN AND PELVIS WITH CONTRAST TECHNIQUE: Multidetector CT imaging of the abdomen and pelvis was performed using the standard protocol following bolus administration of intravenous contrast. CONTRAST:  80mL ISOVUE-300 IOPAMIDOL (ISOVUE-300) INJECTION 61% COMPARISON:  03/12/2016 chest CT FINDINGS: Lower chest: Small right effusion with several bilateral cavitary opacities in both lower lobes concerning for septic emboli or cavitary pneumonia. More confluent airspace opacity in the left lower lobe is also noted. The visualized heart is normal in size. There is no pericardial effusion. No large central pulmonary embolus. Hepatobiliary: Normal appearance of the liver without space-occupying mass or biliary dilatation. Cholecystectomy. Pancreas: No pancreatic mass or ductal dilatation. Mild enlargement of the pancreatic head with surrounding peripancreatic fluid is noted. Correlate to exclude a pancreatitis. Spleen: The spleen is enlarged measuring 15.7 x 5.6 x 17 cm (volume = 780 cm^3) and demonstrates a developmental cleft, partially visualized on prior chest CT and therefore believed less likely to represent a laceration. Adrenals/Urinary Tract: Adrenal glands are unremarkable. Kidneys are normal, without renal calculi, solid appearing lesions, or hydronephrosis. There appear to be pair of water attenuating cysts in the lower pole the right kidney measuring up to 2 x 1.3 cm  in aggregate. Bladder is unremarkable. Stomach/Bowel: Stomach is within normal limits. Appendix appears normal. No evidence of bowel wall thickening, distention, or inflammatory changes. Vascular/Lymphatic: No significant vascular  findings are present. No enlarged abdominal or pelvic lymph nodes. Reproductive: Uterus and bilateral adnexa are unremarkable. Other: Moderate volume of ascites.  Anasarca. Musculoskeletal: No acute or significant osseous findings. IMPRESSION: 1. Bibasilar, multifocal cavitary lesions noted within the visualized lower lobes consistent with septic emboli or possibly cavitary pneumonia. More confluent airspace disease in the left lower lobe without cavitation is also present. There is a small right pleural effusion. 2. Anasarca with moderate volume of ascites noted within the abdomen and pelvis. Question right heart dysfunction/failure. 3. Splenomegaly. 4. Water attenuating cysts in the lower pole the right kidney in aggregate measuring 2 x 1.3 cm. 5. Status post cholecystectomy. Electronically Signed   By: Tollie Eth M.D.   On: 11/14/2016 16:26   Dg Chest 1v Repeat Same Day  Result Date: 11/18/2016 CLINICAL DATA:  Chest tube placement EXAM: CHEST - 1 VIEW SAME DAY COMPARISON:  Earlier same day FINDINGS: Large bore chest tube placed on the right along the lateral margin. Marked reduction an amount of right pleural air, nearly completely evacuated. Patchy infiltrates persist in both lower lobes. IMPRESSION: Large bore chest tube placed. Near complete resolution of right pneumothorax. Electronically Signed   By: Paulina Fusi M.D.   On: 11/18/2016 11:40   Dg Chest Port 1 View  Result Date: 11/20/2016 CLINICAL DATA:  26 year old female with pneumothorax. Recent transesophageal echocardiogram. Subsequent encounter. EXAM: PORTABLE CHEST 1 VIEW COMPARISON:  11/20/2016 8:30 a.m. FINDINGS: Right-sided chest tube remains in place. No pneumothorax detected on this frontal projection. Cavitary lesions bilaterally. Left base consolidation. Atelectasis/pleural thickening right lung base. Cardiomegaly. IMPRESSION: No significant change since exam earlier today. Electronically Signed   By: Lacy Duverney M.D.   On: 11/20/2016  13:55   Dg Chest Port 1 View  Result Date: 11/20/2016 CLINICAL DATA:  Chest tube, shortness of Breath EXAM: PORTABLE CHEST 1 VIEW COMPARISON:  11/19/2016 FINDINGS: Right chest tube remains in place, unchanged. No pneumothorax. Patchy bilateral airspace opacities are again noted, some with cavitation. Small right pleural effusion. Heart is borderline in size. IMPRESSION: Right chest tube remains in place without pneumothorax. Stable patchy bilateral airspace disease, some of which is cavitary. Electronically Signed   By: Charlett Nose M.D.   On: 11/20/2016 08:42   Dg Chest Port 1 View  Result Date: 11/19/2016 CLINICAL DATA:  26 year old female with cavitary lung lesions possibly from septic endocarditis. Subsequent encounter. EXAM: PORTABLE CHEST 1 VIEW COMPARISON:  11/18/2016. FINDINGS: Right-sided chest tube remains in place. Tiny hydropneumothorax versus fluid within minor fissure noted. Multiple cavitary lesions. Right-sided pleural effusion. Basilar atelectasis versus infiltrate greater on the left. Cardiomegaly. Mild scoliosis. IMPRESSION: Right-sided chest tube remains in place. Tiny hydropneumothorax versus fluid within minor fissure. Multiple cavitary lesions. Right-sided pleural effusion. Basilar atelectasis versus infiltrate greater on the left. Electronically Signed   By: Lacy Duverney M.D.   On: 11/19/2016 07:16   Dg Chest Port 1 View  Result Date: 11/18/2016 CLINICAL DATA:  Followup left pneumothorax. Patient with bacterial endocarditis. EXAM: PORTABLE CHEST 1 VIEW COMPARISON:  11/17/2016 and prior studies FINDINGS: A moderate right hydropneumothorax is unchanged in size, but now with pleural fluid in its basilar portion. A right thoracostomy tube is unchanged. Scattered pulmonary opacities/ nodules and left lower lung consolidations/atelectasis again noted. There has been no other interval change. IMPRESSION: Moderate right hydropneumothorax,  unchanged in size but now with pleural fluid in its  basilar portion. Right thoracostomy tube remains unchanged. Electronically Signed   By: Harmon Pier M.D.   On: 11/18/2016 10:14   Dg Chest Port 1 View  Result Date: 11/17/2016 CLINICAL DATA:  Follow-up right-sided chest tube placement. Initial encounter. EXAM: PORTABLE CHEST 1 VIEW COMPARISON:  Chest radiograph performed 11/16/2016 FINDINGS: The patient's moderate right-sided pneumothorax has decreased in size. The right-sided chest tube is grossly unchanged in appearance. Vascular congestion is noted. Patchy left-sided airspace opacities raise concern for pneumonia. Right-sided airspace opacity may reflect atelectasis. A small left pleural effusion is noted. The cardiomediastinal silhouette is mildly enlarged. No acute osseous abnormalities are identified. IMPRESSION: 1. Moderate right-sided pneumothorax has decreased in size. Right-sided chest tube is grossly unchanged in appearance. 2. Vascular congestion and mild cardiomegaly. Patchy left-sided airspace opacities raise concern for pneumonia. Small left pleural effusion noted. 3. Right-sided airspace opacity may reflect atelectasis. Electronically Signed   By: Roanna Raider M.D.   On: 11/17/2016 00:33   Dg Chest Port 1 View  Result Date: 11/16/2016 CLINICAL DATA:  Chest tube placement for right pneumothorax. EXAM: PORTABLE CHEST 1 VIEW COMPARISON:  Single-view of the chest earlier today and 11/11/2016. FINDINGS: Pigtail catheter is now in place in the right chest. Large right pneumothorax is unchanged. Patchy airspace disease in cavitary lesions in the left chest persist. Heart size is normal. IMPRESSION: No change in large right pneumothorax after chest tube placement. No change in patchy airspace disease in cavitary lesions on the left. These results were called by telephone at the time of interpretation on 11/16/2016 at 8:10 pm to Estrella Deeds, RN, who verbally acknowledged these results. Electronically Signed   By: Drusilla Kanner M.D.   On: 11/16/2016  20:12   Dg Chest Port 1 View  Result Date: 11/16/2016 CLINICAL DATA:  Shortness of Breath EXAM: PORTABLE CHEST 1 VIEW COMPARISON:  11/11/2016 FINDINGS: Cardiac shadow is within normal limits. Cavitary nodular densities are noted throughout both lungs similar to that seen on previous CT examination. A new right-sided moderately large pneumothorax is noted with consolidation of the lower lobe on the right. No acute bony abnormality is noted. IMPRESSION: Moderately large right-sided pneumothorax new from the prior exam. Near complete collapse of the right lower lobe is noted. Stable cavitary lesions within both lungs. Critical Value/emergent results were called by telephone at the time of interpretation on 11/16/2016 at 6:24 pm to Dr Leitha Bleak, who verbally acknowledged these results. Electronically Signed   By: Alcide Clever M.D.   On: 11/16/2016 18:27   Dg Chest Port 1 View  Result Date: 11/11/2016 CLINICAL DATA:  Check right IJ line placement.  Initial encounter. EXAM: PORTABLE CHEST 1 VIEW COMPARISON:  Chest radiograph performed 11/10/2016 FINDINGS: The right IJ line is noted ending at the right atrium. This could be retracted 2-3 cm. Mild vascular congestion is noted. Mild left-sided opacities may reflect atelectasis or mild pneumonia. No pleural effusion or pneumothorax is seen. The cardiomediastinal silhouette is borderline normal in size. No acute osseous abnormalities are identified. IMPRESSION: 1. Right IJ line noted ending at the right atrium. This could be retracted 2-3 cm, as deemed clinically appropriate. 2. Mild vascular congestion noted. 3. Mild left-sided airspace opacities may reflect atelectasis or mild pneumonia. Electronically Signed   By: Roanna Raider M.D.   On: 11/11/2016 00:37   Dg Chest Port 1 View  Result Date: 11/10/2016 CLINICAL DATA:  Retraction of the IJ line about 10  cm EXAM: PORTABLE CHEST 1 VIEW COMPARISON:  CT chest 11/10/2016.  Chest 11/10/2016. FINDINGS: The left central  venous catheter tip localizes to the mid/distal right atrial region. If placement at or above the cavoatrial junction is desired, retraction of about 3-4 cm is recommended. No pneumothorax. Shallow inspiration with atelectasis in the lung bases. Borderline heart size. Patchy airspace infiltrates in both lungs could indicate edema, aspiration, or pneumonia. No blunting of costophrenic angles. IMPRESSION: Central venous catheter tip is in the mid/ low right atrial region. If placement at or above the cavoatrial junction is desired, retraction of about 3-4 cm is recommended. Electronically Signed   By: Burman Nieves M.D.   On: 11/10/2016 21:35   Dg Chest Portable 1 View  Result Date: 11/10/2016 CLINICAL DATA:  Status post central line placement. EXAM: PORTABLE CHEST 1 VIEW COMPARISON:  Chest x-ray from earlier same day. FINDINGS: Interval placement of a right IJ central line with tip passing to the left of midline and likely in the right ventricle or main pulmonary artery. The bilateral small nodular airspace opacities throughout both lungs are unchanged in the short-term interval. Suspect small bilateral pleural effusions. No pneumothorax seen. IMPRESSION: 1. Right IJ central line placement with tip positioned to the left of midline either within the right ventricle or main pulmonary artery. Recommend retracting at least 10 cm. 2. Bilateral small nodular airspace opacities throughout both lungs are unchanged in the short-term interval. As recommended on earlier chest x-ray report, would consider chest CT for further characterization. 3. Suspect small bilateral pleural effusions. Electronically Signed   By: Bary Richard M.D.   On: 11/10/2016 18:37    Lab Data:  CBC:  Recent Labs Lab 11/21/16 0256 11/22/16 0231 11/24/16 0606 11/25/16 0445 11/26/16 0612  WBC 8.8 7.4 5.4 5.6 5.5  HGB 8.2* 8.5* 7.5* 7.1* 9.5*  HCT 25.7* 26.3* 23.8* 22.7* 29.2*  MCV 85.4 85.7 86.5 88.3 87.7  PLT 411* 407* 360 332  326   Basic Metabolic Panel:  Recent Labs Lab 11/20/16 0239 11/21/16 0256 11/22/16 0231 11/23/16 0254 11/24/16 0606 11/25/16 0445 11/26/16 0612  NA 137 136 136  --  139 138 139  K 4.0 3.7 3.7  --  3.4* 3.8 3.6  CL 109 106 105  --  107 107 107  CO2 22 22 22   --  24 26 24   GLUCOSE 81 93 88  --  89 97 86  BUN 10 9 9   --  7 10 10   CREATININE 0.93 0.95 0.84 0.78 0.79 0.80 0.92  0.95  CALCIUM 7.9* 7.9* 8.0*  --  7.9* 8.3* 8.3*  MG 1.7 1.7  --   --   --   --   --   PHOS 4.9* 4.8*  --   --   --   --   --    GFR: Estimated Creatinine Clearance: 74.2 mL/min (by C-G formula based on SCr of 0.95 mg/dL). Liver Function Tests:  Recent Labs Lab 11/21/16 0256  AST 13*  ALT 8*  ALKPHOS 151*  BILITOT 0.4  PROT 5.6*  ALBUMIN 1.5*   No results for input(s): LIPASE, AMYLASE in the last 168 hours. No results for input(s): AMMONIA in the last 168 hours. Coagulation Profile: No results for input(s): INR, PROTIME in the last 168 hours. Cardiac Enzymes: No results for input(s): CKTOTAL, CKMB, CKMBINDEX, TROPONINI in the last 168 hours. BNP (last 3 results) No results for input(s): PROBNP in the last 8760 hours. HbA1C: No results for  input(s): HGBA1C in the last 72 hours. CBG: No results for input(s): GLUCAP in the last 168 hours. Lipid Profile: No results for input(s): CHOL, HDL, LDLCALC, TRIG, CHOLHDL, LDLDIRECT in the last 72 hours. Thyroid Function Tests: No results for input(s): TSH, T4TOTAL, FREET4, T3FREE, THYROIDAB in the last 72 hours. Anemia Panel: No results for input(s): VITAMINB12, FOLATE, FERRITIN, TIBC, IRON, RETICCTPCT in the last 72 hours. Urine analysis:    Component Value Date/Time   COLORURINE YELLOW 11/10/2016 2132   APPEARANCEUR CLEAR 11/10/2016 2132   LABSPEC 1.006 11/10/2016 2132   PHURINE 5.0 11/10/2016 2132   GLUCOSEU NEGATIVE 11/10/2016 2132   HGBUR MODERATE (A) 11/10/2016 2132   BILIRUBINUR NEGATIVE 11/10/2016 2132   KETONESUR NEGATIVE 11/10/2016  2132   PROTEINUR NEGATIVE 11/10/2016 2132   UROBILINOGEN 0.2 08/05/2010 1821   NITRITE NEGATIVE 11/10/2016 2132   LEUKOCYTESUR SMALL (A) 11/10/2016 2132     Kasper Mudrick M.D. [facility] 11/26/2016, 1:13 PM  Pager: [phone] Between 7am to 7pm - call Pager - [phone]  After 7pm go to [email or web address] - password [email or web address]  Call night coverage person covering after 7pm            [facility]                                                                              Patient Demographics  Savannah Palmer, is a 26 y.o. female, DOB - December 21, 1990, GNF:[id number]  Admit date - 11/10/2016   Admitting Physician Coralyn Helling, MD  Outpatient Primary MD for the patient is Patient, No Pcp Per  Outpatient specialists:   LOS - 16  days   Medical records reviewed and are as summarized below:    No chief complaint on file.      Brief summary    26 year old Caucasian female with a past medical history of anxiety, depression, heroin abuse, presented with weakness, cough and chest pain. Initially was in septic shock and required pressors. Blood cultures grew MRSA. 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 She was placed on IV antibiotics. She also developed a spontaneous right-sided pneumothorax. Chest tube was placed by critical care medicine. She was transferred to [facility] for opinion from cardiothoracic surgery. Patient is not a candidate for surgery at this time.   Assessment & Plan    Principal Problem: MRSA bacteremia/Septic shock with right TV endocarditis and septic emboli to lungs/MRSA empyema - Patient presented with weakness, coughing and chest pain and septic shock requiring vasopressors. Blood cultures 5/27 grew out MRSA and was found to have septic emboli throughout her lungs.  -Repeat cultures were still positive, blood cultures repeat on 6/4 has been negative to date  - 2-D echo  on 5/27 showed tricuspid vegetations with moderate TR. TEE on 6/1 showed large tricuspid vegetation, severe TR with dilated RV along with a loculated left effusion -HIV, Hep C negative. Due to family request, RPR was done and it was negative. - Due to persistent bacteremia patient was started on daptomycin 6/2 and Ceftaroline continued to cover lung infection. - cardiothoracic surgery was also consulted, not a candidate for surgery at this time.  -  Per ID recommendations today, DC Ceftaroline and daptomycin, use vancomycin alone, duration through July 15, will need BMP, CBC weekly, trough goal of 15-20. Place PICC line today  Active problems: Right spontaneous pneumothorax- acute respiratory failure/MRSA Empyema -Right empyema with pneumothorax, positive for MRSA. Status post chest tube. - Chest tube removed on 6/7, chest x-ray clear   Acute kidney injury -Baseline creatinine 0.7, creatinine increased to 1.37, likely secondary to dehydration and NSAIDs. - patient received IV fluids, NSAIDs were discontinued, and creatinine function has normalized.   RUQ pain, likely musculoskeletal -CT abdomen and pelvis showed no liver or gallbladder abnormalities, possible musculoskeletal origin, currently stable.    Ascites, anasarca due to right sided hear failure - as a result of severe TR, net positive balance of 13.7 L - limit IVF, hold off on administering diuretics in setting of sepsis and borderline BPs, O2 sats 96% on room air  Splenomegaly - etiology undetermined- CT on 5/30 revealed that the spleen is enlarged measuring 15.7 x 5.6 x 17 cm (volume= 780 cm^3) - liver is unremarkable on CT  Normocytic Anemia - H&H currently stable, was transfused on 6/5, no overt bleeding noted - Iron was 12. Ferritin 213. B-12 1850. Folate 7.8.  Hypokalemia/Hypophosphatemia/Hypomagnesemia - Resolved  Thrombocytopenia - Resolved  Heroin abuse - cont Suboxone- will need transition to a drug  rehab program once stable.   Anxiety - cont Buspar, Neurontin, Xanax  Nicotine dependence -Continue  Nicoderm patch   Code Status: Full CODE STATUS  DVT Prophylaxis:  heparin  Family Communication: Discussed in detail with the patient, all imaging results, lab results explained to the patient and patient's mother in detail at the bedside   Disposition Plan: Transfer to telemetry floor, will need skilled nursing facility  Time Spent in minutes  25 minutes  Procedures:  TEE  - Left ventricle: The cavity size was normal. Wall thickness was normal. Systolic function was normal. The estimated ejection fraction was in the range of 55% to 60%. Wall motion was normal; there were no regional wall motion abnormalities. - Left atrium: No evidence of thrombus in the atrial cavity or appendage. - Right ventricle: The cavity size was mildly dilated. Wall thickness was normal. - Right atrium: No evidence of thrombus in the atrial cavity or appendage. - Tricuspid valve: Flail motion of the posterior leaflet. There is a 8x5 mm vegetation attached to the posterior leaflet, but most of the &quot;mass&quot; described on transthoracic imaging appears to be the flail segment of the valve. There was severe regurgitation directed eccentrically and toward the free wall.  Right chest tube placement for pneumothorax 6/1  Placement of a larger bore chest tube in the right chest 6/3  Consultants:   Infectious disease Cardiothoracic surgery  Antimicrobials:   Daptomycin 6/2> 6/8  Teflaro 5/31> 6/8  Vancomycin 6/8>> will need to July 15   Medications  Scheduled Meds: . acetaminophen  1,000 mg Oral Q6H  . buprenorphine-naloxone  2 tablet Sublingual Daily  . gabapentin  300 mg Oral TID  . heparin  5,000 Units Subcutaneous Q8H  . nicotine  14 mg Transdermal Daily  . saccharomyces boulardii  250 mg Oral BID   Continuous Infusions: . sodium chloride 250 mL  (11/19/16 0700)   PRN Meds:.sodium chloride, albuterol, ALPRAZolam, docusate sodium, ibuprofen, nicotine polacrilex, ondansetron (ZOFRAN) IV, oxyCODONE, sodium chloride flush   Antibiotics   Anti-infectives    Start     Dose/Rate Route Frequency Ordered Stop   11/24/16 0000  vancomycin (VANCOCIN)  IVPB 1000 mg/200 mL premix  Status:  Discontinued     1,000 mg 200 mL/hr over 60 Minutes Intravenous Every 8 hours 11/23/16 1238 11/26/16 0721   11/23/16 1600  vancomycin (VANCOCIN) 1,500 mg in sodium chloride 0.9 % 500 mL IVPB     1,500 mg 250 mL/hr over 120 Minutes Intravenous  Once 11/23/16 1238 11/23/16 1733   11/17/16 1600  DAPTOmycin (CUBICIN) 500 mg in sodium chloride 0.9 % IVPB  Status:  Discontinued     500 mg 220 mL/hr over 30 Minutes Intravenous Every 24 hours 11/17/16 1432 11/23/16 1238   11/17/16 1500  ceftaroline (TEFLARO) 600 mg in sodium chloride 0.9 % 250 mL IVPB  Status:  Discontinued     600 mg 250 mL/hr over 60 Minutes Intravenous Every 12 hours 11/17/16 1435 11/23/16 1238   11/15/16 1800  ceftaroline (TEFLARO) 600 mg in sodium chloride 0.9 % 250 mL IVPB  Status:  Discontinued     600 mg 250 mL/hr over 60 Minutes Intravenous Every 12 hours 11/15/16 1635 11/17/16 1431   11/13/16 1215  vancomycin (VANCOCIN) IVPB 1000 mg/200 mL premix  Status:  Discontinued     1,000 mg 200 mL/hr over 60 Minutes Intravenous Every 8 hours 11/13/16 1203 11/16/16 0850   11/11/16 1100  vancomycin (VANCOCIN) IVPB 750 mg/150 ml premix  Status:  Discontinued     750 mg 150 mL/hr over 60 Minutes Intravenous Every 8 hours 11/11/16 0956 11/13/16 1203   11/11/16 0400  vancomycin (VANCOCIN) 500 mg in sodium chloride 0.9 % 100 mL IVPB  Status:  Discontinued     500 mg 100 mL/hr over 60 Minutes Intravenous Every 12 hours 11/10/16 1951 11/11/16 0956   11/10/16 2200  ceFEPIme (MAXIPIME) 1 g in dextrose 5 % 50 mL IVPB  Status:  Discontinued     1 g 100 mL/hr over 30 Minutes Intravenous Every 12 hours  11/10/16 1951 11/11/16 0926   11/10/16 2000  ceFEPIme (MAXIPIME) 2 g in dextrose 5 % 50 mL IVPB  Status:  Discontinued     2 g 100 mL/hr over 30 Minutes Intravenous  Once 11/10/16 1952 11/10/16 1954   11/10/16 2000  vancomycin (VANCOCIN) IVPB 1000 mg/200 mL premix  Status:  Discontinued     1,000 mg 200 mL/hr over 60 Minutes Intravenous  Once 11/10/16 1952 11/10/16 1954   11/10/16 1430  piperacillin-tazobactam (ZOSYN) IVPB 3.375 g     3.375 g 100 mL/hr over 30 Minutes Intravenous  Once 11/10/16 1426 11/10/16 1503   11/10/16 1430  vancomycin (VANCOCIN) IVPB 1000 mg/200 mL premix     1,000 mg 200 mL/hr over 60 Minutes Intravenous  Once 11/10/16 1426 11/10/16 1653        Subjective:   Savannah Palmer was seen and examined today.No fevers or chills, denies any specific complaints. Patient denies dizziness,  shortness of breath, abdominal pain, N/V/D/C, new weakness, numbess, tingling. No acute events overnight.    Objective:   Vitals:   11/25/16 2207 11/25/16 2310 11/26/16 0500 11/26/16 0602  BP: 130/68 120/80  122/82  Pulse: 70 70  61  Resp: 18 16  18   Temp: 99.2 F (37.3 C) 99.4 F (37.4 C)  98.8 F (37.1 C)  TempSrc: Oral Oral  Oral  SpO2: 95%   96%  Weight:   62.3 kg (137 lb 6.4 oz)   Height:        Intake/Output Summary (Last 24 hours) at 11/26/16 1313 Last data filed at 11/26/16 1000  Gross per 24 hour  Intake          2223.66 ml  Output                0 ml  Net          2223.66 ml     Wt Readings from Last 3 Encounters:  11/26/16 62.3 kg (137 lb 6.4 oz)  03/14/16 58.5 kg (129 lb)  01/19/16 58.1 kg (128 lb)     Exam  General: Alert and oriented x 3, NAD  Eyes: PERRLA, EOMI  HEENT:  Atraumatic, normocephalic, normal oropharynx  Cardiovascular: S1 S2 clear, systolic murmur in tricuspid area, min pedal edema   Respiratory: CTAB  Gastrointestinal: SoftNT, ND, NBS  Ext: mild  pedal edema bilaterally  Neuro: no focal neurological  deficits  Musculoskeletal: No digital cyanosis, clubbing  Skin: No rashes  Psych: alert and oriented 3, normal affect   Data Reviewed:  I have personally reviewed following labs and imaging studies  Micro Results Recent Results (from the past 240 hour(s))  MRSA PCR Screening     Status: None   Collection Time: 11/16/16  5:49 PM  Result Value Ref Range Status   MRSA by PCR NEGATIVE NEGATIVE Final    Comment:        The GeneXpert MRSA Assay (FDA approved for NASAL specimens only), is one component of a comprehensive MRSA colonization surveillance program. It is not intended to diagnose MRSA infection nor to guide or monitor treatment for MRSA infections. DELTA CHECK NOTED   Body fluid culture (includes gram stain)     Status: None   Collection Time: 11/16/16  8:06 PM  Result Value Ref Range Status   Specimen Description PLEURAL FLUID  Final   Special Requests NONE  Final   Gram Stain   Final    FEW WBC PRESENT, PREDOMINANTLY PMN NO ORGANISMS SEEN    Culture   Final    FEW METHICILLIN RESISTANT STAPHYLOCOCCUS AUREUS RESULT CALLED TO, READ BACK BY AND VERIFIED WITH: R. MYRICK RN, AT 1023 11/18/16 BY D. VANHOOK REGARDING CULTURE GROWTH INTERPRET RESULTS WITH CAUTION DUE TO LIMITED SPECIMEN VOLUME Performed at [facility], [address], [place], [place] [address]    Report Status 11/20/2016 FINAL  Final   Organism ID, Bacteria METHICILLIN RESISTANT STAPHYLOCOCCUS AUREUS  Final      Susceptibility   Methicillin resistant staphylococcus aureus - MIC*    CIPROFLOXACIN >=8 RESISTANT Resistant     ERYTHROMYCIN >=8 RESISTANT Resistant     GENTAMICIN <=0.5 SENSITIVE Sensitive     OXACILLIN >=4 RESISTANT Resistant     TETRACYCLINE <=1 SENSITIVE Sensitive     VANCOMYCIN 1 SENSITIVE Sensitive     TRIMETH/SULFA <=10 SENSITIVE Sensitive     CLINDAMYCIN <=0.25 SENSITIVE Sensitive     RIFAMPIN <=0.5 SENSITIVE Sensitive     Inducible Clindamycin NEGATIVE Sensitive     *  FEW METHICILLIN RESISTANT STAPHYLOCOCCUS AUREUS  Culture, blood (routine x 2)     Status: None   Collection Time: 11/19/16  8:57 AM  Result Value Ref Range Status   Specimen Description BLOOD RIGHT ARM  Final   Special Requests IN PEDIATRIC BOTTLE Blood Culture adequate volume  Final   Culture NO GROWTH 5 DAYS  Final   Report Status 11/24/2016 FINAL  Final  Culture, blood (routine x 2)     Status: None   Collection Time: 11/19/16  9:05 AM  Result Value Ref Range Status  Specimen Description BLOOD RIGHT ANTECUBITAL  Final   Special Requests IN PEDIATRIC BOTTLE Blood Culture adequate volume  Final   Culture NO GROWTH 5 DAYS  Final   Report Status 11/24/2016 FINAL  Final    Radiology Reports Dg Chest 2 View  Result Date: 11/23/2016 CLINICAL DATA:  Chest tube removal EXAM: CHEST  2 VIEW COMPARISON:  11/21/2016 FINDINGS: Interval removal of right chest tube with residual small right pleural effusion versus pleural thickening. No pneumothorax. Patchy bilateral airspace opacities are again noted, unchanged. Heart is borderline in size. IMPRESSION: Interval removal of right chest tube without pneumothorax. Otherwise no change. Electronically Signed   By: Charlett Nose M.D.   On: 11/23/2016 08:44   Dg Chest 2 View  Result Date: 11/21/2016 CLINICAL DATA:  Chest soreness.  Chest tube . EXAM: CHEST  2 VIEW COMPARISON:  CT 11/20/2016.  Chest x-ray 11/20/2016 FINDINGS: Chest tube noted on the right. Tiny right anterior hydropneumothorax again noted. No interim change . Stable cardiomegaly. Persistent bilateral pulmonary nodules including cavitary nodules. Reference is made to prior CT report. Small right pleural effusion. No pneumothorax. IMPRESSION: 1. Right chest tube in stable position. Tiny right anterior hydropneumothorax again noted. No interim change. 2. Multiple bilateral pulmonary nodules with cavitation. Reference is made to prior CT report of 11/20/2016 . Electronically Signed   By: Maisie Fus   Register   On: 11/21/2016 11:15   Dg Chest 2 View  Result Date: 11/19/2016 CLINICAL DATA:  Pneumothorax. EXAM: CHEST  2 VIEW COMPARISON:  11/19/2016. FINDINGS: Right chest tube in stable position. No significant pneumothorax noted on today's exam. Small right pleural effusion. Bilateral patchy pulmonary infiltrates with cavitation again noted. Small left pleural effusion noted. Heart size stable. No acute bony abnormality . IMPRESSION: 1. Right chest tube in stable position. No pneumothorax noted on today's exam. Small right pleural effusion. 2. Persistent bilateral cavitary pulmonary infiltrates. Small left pleural effusion. Electronically Signed   By: Maisie Fus  Register   On: 11/19/2016 16:53   Dg Chest 2 View  Result Date: 11/10/2016 CLINICAL DATA:  Chest pain, shortness of breath for 1 week EXAM: CHEST  2 VIEW COMPARISON:  CT chest 03/12/2016, chest x-ray 03/12/2016 FINDINGS: There are bilateral nodular airspace opacities throughout the upper and lower lobes bilaterally. There is no pleural effusion or pneumothorax. The heart and mediastinal contours are unremarkable. The osseous structures are unremarkable. IMPRESSION: New bilateral nodular airspace opacities throughout the upper and lower lobes bilaterally. Findings are concerning for multifocal infection such as septic emboli. Recommend further evaluation with a CT of the chest with intravenous contrast. Electronically Signed   By: Elige Ko   On: 11/10/2016 13:23   Ct Chest Wo Contrast  Result Date: 11/20/2016 CLINICAL DATA:  Follow-up right pleural effusion EXAM: CT CHEST WITHOUT CONTRAST TECHNIQUE: Multidetector CT imaging of the chest was performed following the standard protocol without IV contrast. COMPARISON:  Radiograph 11/20/2016, CT chest 11/10/2016, prior radiographs dating back to 11/10/2016 FINDINGS: Cardiovascular: Limited assessment without intravenous contrast. Small fluid in the superior pericardial recess. There is cardiomegaly.  Mediastinum/Nodes: Difficult evaluation without contrast. Mediastinal adenopathy is present right paratracheal soft tissue fullness, possible adenopathy does not appear significantly changed. Midline trachea. Esophagus grossly unremarkable. Lungs/Pleura: Interim placement of a right lower lateral chest tube since the prior CT with the tip terminating along the right upper posterior pleural surface. Small bilateral pleural effusions right greater than left, slightly increased compared to the previous CT. Tiny right anterior hydropneumothorax. Multiple bilateral pulmonary nodules  and cavitary lesions. Some of the cavitary lesions have decreased, however if there appears to be increased nodules/disease most notable in the left upper lobe and the right lower lobe. Decreased septal thickening compared to the previous exam. Upper Abdomen: Spleen appears enlarged. Partially visualized surgical clips in the gallbladder fossa. Musculoskeletal: No acute or suspicious bone lesion. IMPRESSION: 1. Interim placement of right-sided chest tube. Small residual pleural effusions right greater than left, mildly increased compared to the prior chest CT. Small right anterior hydropneumothorax. 2. Innumerable bilateral lung nodules and cavitary lesions, suspected to represent septic emboli. This appears increased in the left upper and right lower lobe since the prior CT. 3. Suspect mediastinal adenopathy although evaluation of the mediastinum is limited without contrast 4. Splenomegaly Electronically Signed   By: Jasmine Pang M.D.   On: 11/20/2016 21:25   Ct Chest Wo Contrast  Result Date: 11/10/2016 CLINICAL DATA:  She c/o some chest discomfrot and shortness of breath x 1 week. She states these are similar s/s she had a year ago, at which time she was dx with "pulmonary abscess". She is in no distress. IV drug user EXAM: CT CHEST WITHOUT CONTRAST TECHNIQUE: Multidetector CT imaging of the chest was performed following the standard  protocol without IV contrast. COMPARISON:  Chest CT angiogram dated 03/12/2016. FINDINGS: Cardiovascular: Probable small pericardial effusion and/or pericardial thickening, difficult to characterize without IV contrast. Heart size is within normal limits. Thoracic aorta appears to be normal in caliber. Mediastinum/Nodes: Soft tissue density material within the right upper peritracheal space, also difficult to characterize without IV contrast, presumed lymphadenopathy. Additional mild lymphadenopathy within the anterior mediastinum and aortopulmonary window region. Lungs/Pleura: Numerous cavitary consolidations throughout both lungs, peripherally distributed suggesting septic emboli. More confluent consolidations at each lung base, more likely atelectasis than pneumonia. Small right pleural effusion. Upper Abdomen: Suspected splenomegaly, incompletely imaged. Musculoskeletal: No acute or suspicious osseous finding. IMPRESSION: 1. Numerous cavitary consolidations throughout both lungs, with a peripheral distribution suggesting septic emboli. Largest cavitary consolidation is within the left lower lobe measuring approximately 4 cm greatest dimension. 2. Probable small pericardial effusion and/or pericardial wall thickening, difficult to delineate without IV contrast. 3. Probable mediastinal lymphadenopathy, again difficult to definitively characterize without IV contrast, alternatively confluent fluid or edema within the mediastinum. 4. Small right pleural effusion. 5. Probable splenomegaly, incompletely imaged at the lower aspects of this chest CT. 6. Right IJ line in place with tip passing through the right atrium and into the right ventricle. Recommend retracting to the cavoatrial junction for optimal radiographic positioning. These results and recommendations were called by telephone at the time of interpretation on 11/10/2016 at 7:35 pm to Dr. Katrinka Blazing , who verbally acknowledged these results. Electronically Signed    By: Bary Richard M.D.   On: 11/10/2016 19:36   Ct Abdomen Pelvis W Contrast  Result Date: 11/14/2016 CLINICAL DATA:  Right upper quadrant pain times 2-3 days. Polysubstance abuse. Cholecystectomy. History of endocarditis and septic emboli. EXAM: CT ABDOMEN AND PELVIS WITH CONTRAST TECHNIQUE: Multidetector CT imaging of the abdomen and pelvis was performed using the standard protocol following bolus administration of intravenous contrast. CONTRAST:  80mL ISOVUE-300 IOPAMIDOL (ISOVUE-300) INJECTION 61% COMPARISON:  03/12/2016 chest CT FINDINGS: Lower chest: Small right effusion with several bilateral cavitary opacities in both lower lobes concerning for septic emboli or cavitary pneumonia. More confluent airspace opacity in the left lower lobe is also noted. The visualized heart is normal in size. There is no pericardial effusion. No large central pulmonary  embolus. Hepatobiliary: Normal appearance of the liver without space-occupying mass or biliary dilatation. Cholecystectomy. Pancreas: No pancreatic mass or ductal dilatation. Mild enlargement of the pancreatic head with surrounding peripancreatic fluid is noted. Correlate to exclude a pancreatitis. Spleen: The spleen is enlarged measuring 15.7 x 5.6 x 17 cm (volume = 780 cm^3) and demonstrates a developmental cleft, partially visualized on prior chest CT and therefore believed less likely to represent a laceration. Adrenals/Urinary Tract: Adrenal glands are unremarkable. Kidneys are normal, without renal calculi, solid appearing lesions, or hydronephrosis. There appear to be pair of water attenuating cysts in the lower pole the right kidney measuring up to 2 x 1.3 cm in aggregate. Bladder is unremarkable. Stomach/Bowel: Stomach is within normal limits. Appendix appears normal. No evidence of bowel wall thickening, distention, or inflammatory changes. Vascular/Lymphatic: No significant vascular findings are present. No enlarged abdominal or pelvic lymph nodes.  Reproductive: Uterus and bilateral adnexa are unremarkable. Other: Moderate volume of ascites.  Anasarca. Musculoskeletal: No acute or significant osseous findings. IMPRESSION: 1. Bibasilar, multifocal cavitary lesions noted within the visualized lower lobes consistent with septic emboli or possibly cavitary pneumonia. More confluent airspace disease in the left lower lobe without cavitation is also present. There is a small right pleural effusion. 2. Anasarca with moderate volume of ascites noted within the abdomen and pelvis. Question right heart dysfunction/failure. 3. Splenomegaly. 4. Water attenuating cysts in the lower pole the right kidney in aggregate measuring 2 x 1.3 cm. 5. Status post cholecystectomy. Electronically Signed   By: Tollie Eth M.D.   On: 11/14/2016 16:26   Dg Chest 1v Repeat Same Day  Result Date: 11/18/2016 CLINICAL DATA:  Chest tube placement EXAM: CHEST - 1 VIEW SAME DAY COMPARISON:  Earlier same day FINDINGS: Large bore chest tube placed on the right along the lateral margin. Marked reduction an amount of right pleural air, nearly completely evacuated. Patchy infiltrates persist in both lower lobes. IMPRESSION: Large bore chest tube placed. Near complete resolution of right pneumothorax. Electronically Signed   By: Paulina Fusi M.D.   On: 11/18/2016 11:40   Dg Chest Port 1 View  Result Date: 11/20/2016 CLINICAL DATA:  26 year old female with pneumothorax. Recent transesophageal echocardiogram. Subsequent encounter. EXAM: PORTABLE CHEST 1 VIEW COMPARISON:  11/20/2016 8:30 a.m. FINDINGS: Right-sided chest tube remains in place. No pneumothorax detected on this frontal projection. Cavitary lesions bilaterally. Left base consolidation. Atelectasis/pleural thickening right lung base. Cardiomegaly. IMPRESSION: No significant change since exam earlier today. Electronically Signed   By: Lacy Duverney M.D.   On: 11/20/2016 13:55   Dg Chest Port 1 View  Result Date: 11/20/2016 CLINICAL  DATA:  Chest tube, shortness of Breath EXAM: PORTABLE CHEST 1 VIEW COMPARISON:  11/19/2016 FINDINGS: Right chest tube remains in place, unchanged. No pneumothorax. Patchy bilateral airspace opacities are again noted, some with cavitation. Small right pleural effusion. Heart is borderline in size. IMPRESSION: Right chest tube remains in place without pneumothorax. Stable patchy bilateral airspace disease, some of which is cavitary. Electronically Signed   By: Charlett Nose M.D.   On: 11/20/2016 08:42   Dg Chest Port 1 View  Result Date: 11/19/2016 CLINICAL DATA:  26 year old female with cavitary lung lesions possibly from septic endocarditis. Subsequent encounter. EXAM: PORTABLE CHEST 1 VIEW COMPARISON:  11/18/2016. FINDINGS: Right-sided chest tube remains in place. Tiny hydropneumothorax versus fluid within minor fissure noted. Multiple cavitary lesions. Right-sided pleural effusion. Basilar atelectasis versus infiltrate greater on the left. Cardiomegaly. Mild scoliosis. IMPRESSION: Right-sided chest tube remains in  place. Tiny hydropneumothorax versus fluid within minor fissure. Multiple cavitary lesions. Right-sided pleural effusion. Basilar atelectasis versus infiltrate greater on the left. Electronically Signed   By: Lacy Duverney M.D.   On: 11/19/2016 07:16   Dg Chest Port 1 View  Result Date: 11/18/2016 CLINICAL DATA:  Followup left pneumothorax. Patient with bacterial endocarditis. EXAM: PORTABLE CHEST 1 VIEW COMPARISON:  11/17/2016 and prior studies FINDINGS: A moderate right hydropneumothorax is unchanged in size, but now with pleural fluid in its basilar portion. A right thoracostomy tube is unchanged. Scattered pulmonary opacities/ nodules and left lower lung consolidations/atelectasis again noted. There has been no other interval change. IMPRESSION: Moderate right hydropneumothorax, unchanged in size but now with pleural fluid in its basilar portion. Right thoracostomy tube remains unchanged.  Electronically Signed   By: Harmon Pier M.D.   On: 11/18/2016 10:14   Dg Chest Port 1 View  Result Date: 11/17/2016 CLINICAL DATA:  Follow-up right-sided chest tube placement. Initial encounter. EXAM: PORTABLE CHEST 1 VIEW COMPARISON:  Chest radiograph performed 11/16/2016 FINDINGS: The patient's moderate right-sided pneumothorax has decreased in size. The right-sided chest tube is grossly unchanged in appearance. Vascular congestion is noted. Patchy left-sided airspace opacities raise concern for pneumonia. Right-sided airspace opacity may reflect atelectasis. A small left pleural effusion is noted. The cardiomediastinal silhouette is mildly enlarged. No acute osseous abnormalities are identified. IMPRESSION: 1. Moderate right-sided pneumothorax has decreased in size. Right-sided chest tube is grossly unchanged in appearance. 2. Vascular congestion and mild cardiomegaly. Patchy left-sided airspace opacities raise concern for pneumonia. Small left pleural effusion noted. 3. Right-sided airspace opacity may reflect atelectasis. Electronically Signed   By: Roanna Raider M.D.   On: 11/17/2016 00:33   Dg Chest Port 1 View  Result Date: 11/16/2016 CLINICAL DATA:  Chest tube placement for right pneumothorax. EXAM: PORTABLE CHEST 1 VIEW COMPARISON:  Single-view of the chest earlier today and 11/11/2016. FINDINGS: Pigtail catheter is now in place in the right chest. Large right pneumothorax is unchanged. Patchy airspace disease in cavitary lesions in the left chest persist. Heart size is normal. IMPRESSION: No change in large right pneumothorax after chest tube placement. No change in patchy airspace disease in cavitary lesions on the left. These results were called by telephone at the time of interpretation on 11/16/2016 at 8:10 pm to Estrella Deeds, RN, who verbally acknowledged these results. Electronically Signed   By: Drusilla Kanner M.D.   On: 11/16/2016 20:12   Dg Chest Port 1 View  Result Date:  11/16/2016 CLINICAL DATA:  Shortness of Breath EXAM: PORTABLE CHEST 1 VIEW COMPARISON:  11/11/2016 FINDINGS: Cardiac shadow is within normal limits. Cavitary nodular densities are noted throughout both lungs similar to that seen on previous CT examination. A new right-sided moderately large pneumothorax is noted with consolidation of the lower lobe on the right. No acute bony abnormality is noted. IMPRESSION: Moderately large right-sided pneumothorax new from the prior exam. Near complete collapse of the right lower lobe is noted. Stable cavitary lesions within both lungs. Critical Value/emergent results were called by telephone at the time of interpretation on 11/16/2016 at 6:24 pm to Dr Leitha Bleak, who verbally acknowledged these results. Electronically Signed   By: Alcide Clever M.D.   On: 11/16/2016 18:27   Dg Chest Port 1 View  Result Date: 11/11/2016 CLINICAL DATA:  Check right IJ line placement.  Initial encounter. EXAM: PORTABLE CHEST 1 VIEW COMPARISON:  Chest radiograph performed 11/10/2016 FINDINGS: The right IJ line is noted ending at the right  atrium. This could be retracted 2-3 cm. Mild vascular congestion is noted. Mild left-sided opacities may reflect atelectasis or mild pneumonia. No pleural effusion or pneumothorax is seen. The cardiomediastinal silhouette is borderline normal in size. No acute osseous abnormalities are identified. IMPRESSION: 1. Right IJ line noted ending at the right atrium. This could be retracted 2-3 cm, as deemed clinically appropriate. 2. Mild vascular congestion noted. 3. Mild left-sided airspace opacities may reflect atelectasis or mild pneumonia. Electronically Signed   By: Roanna Raider M.D.   On: 11/11/2016 00:37   Dg Chest Port 1 View  Result Date: 11/10/2016 CLINICAL DATA:  Retraction of the IJ line about 10 cm EXAM: PORTABLE CHEST 1 VIEW COMPARISON:  CT chest 11/10/2016.  Chest 11/10/2016. FINDINGS: The left central venous catheter tip localizes to the mid/distal  right atrial region. If placement at or above the cavoatrial junction is desired, retraction of about 3-4 cm is recommended. No pneumothorax. Shallow inspiration with atelectasis in the lung bases. Borderline heart size. Patchy airspace infiltrates in both lungs could indicate edema, aspiration, or pneumonia. No blunting of costophrenic angles. IMPRESSION: Central venous catheter tip is in the mid/ low right atrial region. If placement at or above the cavoatrial junction is desired, retraction of about 3-4 cm is recommended. Electronically Signed   By: Burman Nieves M.D.   On: 11/10/2016 21:35   Dg Chest Portable 1 View  Result Date: 11/10/2016 CLINICAL DATA:  Status post central line placement. EXAM: PORTABLE CHEST 1 VIEW COMPARISON:  Chest x-ray from earlier same day. FINDINGS: Interval placement of a right IJ central line with tip passing to the left of midline and likely in the right ventricle or main pulmonary artery. The bilateral small nodular airspace opacities throughout both lungs are unchanged in the short-term interval. Suspect small bilateral pleural effusions. No pneumothorax seen. IMPRESSION: 1. Right IJ central line placement with tip positioned to the left of midline either within the right ventricle or main pulmonary artery. Recommend retracting at least 10 cm. 2. Bilateral small nodular airspace opacities throughout both lungs are unchanged in the short-term interval. As recommended on earlier chest x-ray report, would consider chest CT for further characterization. 3. Suspect small bilateral pleural effusions. Electronically Signed   By: Bary Richard M.D.   On: 11/10/2016 18:37    Lab Data:  CBC:  Recent Labs Lab 11/21/16 0256 11/22/16 0231 11/24/16 0606 11/25/16 0445 11/26/16 0612  WBC 8.8 7.4 5.4 5.6 5.5  HGB 8.2* 8.5* 7.5* 7.1* 9.5*  HCT 25.7* 26.3* 23.8* 22.7* 29.2*  MCV 85.4 85.7 86.5 88.3 87.7  PLT 411* 407* 360 332 326   Basic Metabolic Panel:  Recent  Labs Lab 11/20/16 0239 11/21/16 0256 11/22/16 0231 11/23/16 0254 11/24/16 0606 11/25/16 0445 11/26/16 0612  NA 137 136 136  --  139 138 139  K 4.0 3.7 3.7  --  3.4* 3.8 3.6  CL 109 106 105  --  107 107 107  CO2 22 22 22   --  24 26 24   GLUCOSE 81 93 88  --  89 97 86  BUN 10 9 9   --  7 10 10   CREATININE 0.93 0.95 0.84 0.78 0.79 0.80 0.92  0.95  CALCIUM 7.9* 7.9* 8.0*  --  7.9* 8.3* 8.3*  MG 1.7 1.7  --   --   --   --   --   PHOS 4.9* 4.8*  --   --   --   --   --  GFR: Estimated Creatinine Clearance: 74.2 mL/min (by C-G formula based on SCr of 0.95 mg/dL). Liver Function Tests:  Recent Labs Lab 11/21/16 0256  AST 13*  ALT 8*  ALKPHOS 151*  BILITOT 0.4  PROT 5.6*  ALBUMIN 1.5*   No results for input(s): LIPASE, AMYLASE in the last 168 hours. No results for input(s): AMMONIA in the last 168 hours. Coagulation Profile: No results for input(s): INR, PROTIME in the last 168 hours. Cardiac Enzymes: No results for input(s): CKTOTAL, CKMB, CKMBINDEX, TROPONINI in the last 168 hours. BNP (last 3 results) No results for input(s): PROBNP in the last 8760 hours. HbA1C: No results for input(s): HGBA1C in the last 72 hours. CBG: No results for input(s): GLUCAP in the last 168 hours. Lipid Profile: No results for input(s): CHOL, HDL, LDLCALC, TRIG, CHOLHDL, LDLDIRECT in the last 72 hours. Thyroid Function Tests: No results for input(s): TSH, T4TOTAL, FREET4, T3FREE, THYROIDAB in the last 72 hours. Anemia Panel: No results for input(s): VITAMINB12, FOLATE, FERRITIN, TIBC, IRON, RETICCTPCT in the last 72 hours. Urine analysis:    Component Value Date/Time   COLORURINE YELLOW 11/10/2016 2132   APPEARANCEUR CLEAR 11/10/2016 2132   LABSPEC 1.006 11/10/2016 2132   PHURINE 5.0 11/10/2016 2132   GLUCOSEU NEGATIVE 11/10/2016 2132   HGBUR MODERATE (A) 11/10/2016 2132   BILIRUBINUR NEGATIVE 11/10/2016 2132   KETONESUR NEGATIVE 11/10/2016 2132   PROTEINUR NEGATIVE 11/10/2016 2132    UROBILINOGEN 0.2 08/05/2010 1821   NITRITE NEGATIVE 11/10/2016 2132   LEUKOCYTESUR SMALL (A) 11/10/2016 2132     Ashrith Sagan M.D. [facility] 11/26/2016, 1:13 PM  Pager: [phone] Between 7am to 7pm - call Pager - [phone]  After 7pm go to [email or web address] - password [email or web address]  Call night coverage person covering after 7pm

## 2016-11-26 NOTE — Progress Notes (Signed)
Pharmacy Antibiotic Note  Savannah Palmer is a 26 y.o. 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  Pharmacy has been consulted for Vancomycin dosing.  Vancomycin trough this morning was 38 (goal 15-20).  Vancomycin held for supratherapeutic level.  No noted changes in SCr or UOP.  Plan: Hold Vancomycin for now. Vancomycin random level at 1800 tonight. Calculate patient specific pharmacokinetics and implement new dosing regimen if appropriate.   Height: 5\' 3"  (160 cm) Weight: 137 lb 6.4 oz (62.3 kg) IBW/kg (Calculated) : 52.4  Temp (24hrs), Avg:99.3 F (37.4 C), Min:98.6 F (37 C), Max:99.9 F (37.7 C)   Recent Labs Lab 11/21/16 0256 11/22/16 0231 11/23/16 0254 11/24/16 0606 11/25/16 0445 11/26/16 0612  WBC 8.8 7.4  --  5.4 5.6 5.5  CREATININE 0.95 0.84 0.78 0.79 0.80 0.92  0.95  VANCOTROUGH  --   --   --   --   --  38*    Estimated Creatinine Clearance: 74.2 mL/min (by C-G formula based on SCr of 0.95 mg/dL).    No Known Allergies  Antimicrobials this admission: 5/26 Zosyn x 1  5/26 Vanc >> 6/1;   6/8 >> 5/26 Cefepime >> 5/27 5/31 Teflaro >> 6/8 6/2 Daptomycin >> 6/8  Dose adjustments this admission: 5/27: increase vanc to 750 q8 with improved renal function 5/29 1049 VT: 13 (subtherapeutic) on 750mg  q8h prior to 7th dose, inc to 1g q8h 5/31 2030 VT: 19 (therapeutic) - continue 1g q8h 6/11 0600 VT: 38 (accumulated on q8h dose)  VANC HOLD  Microbiology results: 5/26 UCx: ngF 5/26 BCx: 4/4 MRSA 5/26 MRSA PCR: positive  5/27 BCx: 2/2 MRSA  5/28 BCx: 2/2 MRSA 5/28 HIV antibody: non reactive 5/28 HCV RNA: ND 5/29 BCx: MRSA, BCID = MRSA 5/30 @ 0033BCx: 1/2 MRSA 5/30 @ 1610 BCx: ngF 5/31 RPR: ordered 6/1 pleural fluid Cx: MRSA 6/4 BCx: ngF  Thank you for allowing pharmacy to be a part of this patient's care.  Toys 'R' UsKimberly Kristel Durkee, Pharm.D., BCPS Clinical Pharmacist Pager:  [phone] Clinical phone for 11/26/2016 from 8:30-4:00 is [phone]. After 4pm, please call Main Rx [phone]) for assistance. 11/26/2016 2:01 PM

## 2016-11-26 NOTE — Progress Notes (Signed)
[facility]   Reason for visit: Follow up on TV endocarditis  Interval History: back on vancomycin, WBC remains wnl, no fever  Physical Exam: Constitutional:  Vitals:   11/25/16 2310 11/26/16 0602  BP: 120/80 122/82  Pulse: 70 61  Resp: 16 18  Temp: 99.4 F (37.4 C) 98.8 F (37.1 C)   patient appears in NAD Respiratory: Normal respiratory effort; CTA B Cardiovascular: RRR Skin: no rash  Review of Systems: Constitutional: negative for fevers, chills and malaise Gastrointestinal: negative for diarrhea  Lab Results  Component Value Date   WBC 5.5 11/26/2016   HGB 9.5 (L) 11/26/2016   HCT 29.2 (L) 11/26/2016   MCV 87.7 11/26/2016   PLT 326 11/26/2016    Lab Results  Component Value Date   CREATININE 0.95 11/26/2016   CREATININE 0.92 11/26/2016   BUN 10 11/26/2016   NA 139 11/26/2016   K 3.6 11/26/2016   CL 107 11/26/2016   CO2 24 11/26/2016    Lab Results  Component Value Date   ALT 8 (L) 11/21/2016   AST 13 (L) 11/21/2016   ALKPHOS 151 (H) 11/21/2016     Microbiology: Recent Results (from the past 240 hour(s))  MRSA PCR Screening     Status: None   Collection Time: 11/16/16  5:49 PM  Result Value Ref Range Status   MRSA by PCR NEGATIVE NEGATIVE Final    Comment:        The GeneXpert MRSA Assay (FDA approved for NASAL specimens only), is one component of a comprehensive MRSA colonization surveillance program. It is not intended to diagnose MRSA infection nor to guide or monitor treatment for MRSA infections. DELTA CHECK NOTED   Body fluid culture (includes gram stain)     Status: None   Collection Time: 11/16/16  8:06 PM  Result Value Ref Range Status   Specimen Description PLEURAL FLUID  Final   Special Requests NONE  Final   Gram Stain   Final    FEW WBC PRESENT, PREDOMINANTLY PMN NO ORGANISMS SEEN    Culture   Final    FEW METHICILLIN RESISTANT STAPHYLOCOCCUS AUREUS RESULT CALLED TO, READ BACK BY AND  VERIFIED WITH: R. MYRICK RN, AT 1023 11/18/16 BY D. VANHOOK REGARDING CULTURE GROWTH INTERPRET RESULTS WITH CAUTION DUE TO LIMITED SPECIMEN VOLUME Performed at [facility], [address], [place], [place] [address]    Report Status 11/20/2016 FINAL  Final   Organism ID, Bacteria METHICILLIN RESISTANT STAPHYLOCOCCUS AUREUS  Final      Susceptibility   Methicillin resistant staphylococcus aureus - MIC*    CIPROFLOXACIN >=8 RESISTANT Resistant     ERYTHROMYCIN >=8 RESISTANT Resistant     GENTAMICIN <=0.5 SENSITIVE Sensitive     OXACILLIN >=4 RESISTANT Resistant     TETRACYCLINE <=1 SENSITIVE Sensitive     VANCOMYCIN 1 SENSITIVE Sensitive     TRIMETH/SULFA <=10 SENSITIVE Sensitive     CLINDAMYCIN <=0.25 SENSITIVE Sensitive     RIFAMPIN <=0.5 SENSITIVE Sensitive     Inducible Clindamycin NEGATIVE Sensitive     * FEW METHICILLIN RESISTANT STAPHYLOCOCCUS AUREUS  Culture, blood (routine x 2)     Status: None   Collection Time: 11/19/16  8:57 AM  Result Value Ref Range Status   Specimen Description BLOOD RIGHT ARM  Final   Special Requests IN PEDIATRIC BOTTLE Blood Culture adequate volume  Final   Culture NO GROWTH 5 DAYS  Final   Report Status 11/24/2016 FINAL  Final  Culture, blood (routine x 2)     Status: None   Collection Time: 11/19/16  9:05 AM  Result Value Ref Range Status   Specimen Description BLOOD RIGHT ANTECUBITAL  Final   Special Requests IN PEDIATRIC BOTTLE Blood Culture adequate volume  Final   Culture NO GROWTH 5 DAYS  Final   Report Status 11/24/2016 FINAL  Final    Impression/Plan:  1. TV endocarditis - repeat blood cultures from 6/4 have remained negative after previously having persistent positive blood cultures.   She will need treatment with IV vancomycin through July 15th in a SNF.  At the end of treatment, pull the picc line on July 15 I will arrange our follow up  2. Medication monitoring - vancomycin trough high but creat stable.  Will need continued  monitoring of vancomycin levels after discharge at least 2 times per week with twice weekly BMPs

## 2016-11-26 NOTE — Progress Notes (Signed)
Pharmacy Antibiotic Note  Savannah Palmer is a 26 y.o. 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  Pharmacy has been consulted for Vancomycin dosing.  Random Vanc indicating clearance of drug.  Calculated ke 0.06 and t1/2 ~11.5hr.  Cr had increased ~20-25% over last few days.  Plan: Change Vancomycin to 1000mg  IV q12 Repeat Vanc trough at steady state  Height: 5\' 3"  (160 cm) Weight: 137 lb 6.4 oz (62.3 kg) IBW/kg (Calculated) : 52.4  Temp (24hrs), Avg:99.4 F (37.4 C), Min:98.8 F (37.1 C), Max:99.9 F (37.7 C)   Recent Labs Lab 11/21/16 0256 11/22/16 0231 11/23/16 0254 11/24/16 0606 11/25/16 0445 11/26/16 0612 11/26/16 1739  WBC 8.8 7.4  --  5.4 5.6 5.5  --   CREATININE 0.95 0.84 0.78 0.79 0.80 0.92  0.95  --   VANCOTROUGH  --   --   --   --   --  1138*  --   VANCORANDOM  --   --   --   --   --   --  19    Estimated Creatinine Clearance: 74.2 mL/min (by C-G formula based on SCr of 0.95 mg/dL).    No Known Allergies    Thank you for allowing pharmacy to be a part of this patient's care.  Alvester MorinKendra Clarkson Rosselli, B.S., PharmD Clinical Pharmacist Savannah Palmer

## 2016-11-27 LAB — BASIC METABOLIC PANEL
Anion gap: 7 (ref 5–15)
BUN: 6 mg/dL (ref 6–20)
CALCIUM: 8.4 mg/dL — AB (ref 8.9–10.3)
CHLORIDE: 104 mmol/L (ref 101–111)
CO2: 27 mmol/L (ref 22–32)
CREATININE: 0.79 mg/dL (ref 0.44–1.00)
GFR calc non Af Amer: >60 mL/min (ref >60)
GLUCOSE: 94 mg/dL (ref 65–99)
Potassium: 3.3 mmol/L — ABNORMAL LOW (ref 3.5–5.1)
Sodium: 138 mmol/L (ref 135–145)

## 2016-11-27 MED ORDER — POTASSIUM CHLORIDE CRYS ER 20 MEQ PO TBCR
40.0000 meq | EXTENDED_RELEASE_TABLET | Freq: Once | ORAL | Status: AC
  Administered 2016-11-27: 40 meq via ORAL
  Filled 2016-11-27: qty 2

## 2016-11-27 MED ORDER — BUSPIRONE HCL 5 MG PO TABS
10.0000 mg | ORAL_TABLET | Freq: Two times a day (BID) | ORAL | Status: DC
  Administered 2016-11-27 – 2016-12-05 (×17): 10 mg via ORAL
  Filled 2016-11-27 (×17): qty 2

## 2016-11-27 NOTE — Progress Notes (Signed)
[facility]                                                                              Patient Demographics  Savannah Palmer, is a 26 y.o. female, DOB - 02/01/1991, ZHY:[id number]  Admit date - 11/10/2016   Admitting Physician Coralyn Helling, MD  Outpatient Primary MD for the patient is Patient, No Pcp Per  Outpatient specialists:   LOS - 17  days   Medical records reviewed and are as summarized below:    No chief complaint on file.      Brief summary    26 year old Caucasian female with a past medical history of anxiety, depression, heroin abuse, presented with weakness, cough and chest pain. Initially was in septic shock and required pressors. Blood cultures grew MRSA. 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 She was placed on IV antibiotics. She also developed a spontaneous right-sided pneumothorax. Chest tube was placed by critical care medicine. She was transferred to [facility] for opinion from cardiothoracic surgery. Patient is not a candidate for surgery at this time.   Assessment & Plan    Principal Problem: MRSA bacteremia/Septic shock with right TV endocarditis and septic emboli to lungs/MRSA empyema - Patient presented with weakness, coughing, chest pain and septic shock requiring vasopressors. Blood cultures 5/27 grew out MRSA and was found to have septic emboli throughout her lungs.  -Repeat cultures were still positive, blood cultures repeat on 6/4 has been negative to date  - 2-D echo on 5/27 showed tricuspid vegetations with moderate TR. TEE on 6/1 showed large tricuspid vegetation, severe TR with dilated RV along with a loculated left effusion - HIV, Hep C negative. Due to family request, RPR was done and it was negative. - Due to persistent bacteremia patient was started on daptomycin 6/2 and Ceftaroline was continued to cover lung infection. Per ID  recommendations, Ceftaroline and daptomycin have been now discontinued. - cardiothoracic surgery was also consulted and patient was deemed not a candidate for surgery at this time.  - on vancomycin duration through July 15, will need BMP, CBC weekly, trough goal of 15-20.  - PICC line placed on 6/9, continue vancomycin, until July 15. Vanco trough in goal 19 -  Pending skilled nursing facility placement   Active problems: Right spontaneous pneumothorax- acute respiratory failure/MRSA Empyema -Right empyema with pneumothorax, positive for MRSA. Status post chest tube. - Chest tube removed on 6/7, chest x-ray clear   Acute kidney injury -Baseline creatinine 0.7, creatinine increased to 1.37, likely secondary to dehydration and NSAIDs. - patient received IV fluids, NSAIDs were discontinued, and creatinine function has normalized.   RUQ pain, likely musculoskeletal -CT abdomen and pelvis showed no liver or gallbladder abnormalities, possible musculoskeletal origin, currently stable.    Ascites, anasarca due to right sided hear failure - as a result of severe TR, net positive balance of 12L - O2 sats 96% on room air  Splenomegaly - etiology undetermined- CT on 5/30 revealed that the spleen is enlarged measuring 15.7 x 5.6 x 17 cm (volume= 780 cm^3) - liver  is unremarkable on CT  Normocytic Anemia - H&H currently stable, was transfused on 6/5, no overt bleeding noted - Iron was 12. Ferritin 213. B-12 1850. Folate 7.8. - Hemoglobin down to 7.1 on 6/10, patient was transfused 2 units packed RBC with 1 dose of IV Feraheme, currently stable  Hypokalemia/Hypophosphatemia/Hypomagnesemia - Potassium 3.3, replaced  Thrombocytopenia - Resolved  Heroin abuse - cont Suboxone, will need transition to a drug rehab program once stable.  Anxiety - Celexa discontinued per patient's request. Buspar restarted again today per patient's request.  - continue Neurontin and Xanax as needed at  this time.   Nicotine dependence -Continue  Nicoderm patch   Code Status: Full CODE STATUS  DVT Prophylaxis:  heparin  Family Communication: Discussed in detail with the patient, all imaging results, lab results explained to the patient and patient's mother at the bedside. Requested patient's mother not to video record any staff or providers.    Disposition Plan: Hopefully DC to skilled nursing facility when a bed is available. Disposition difficulty due to patient being on suboxone and history of IVDA   Time Spent in minutes  25 minutes  Procedures:  TEE  - Left ventricle: The cavity size was normal. Wall thickness was normal. Systolic function was normal. The estimated ejection fraction was in the range of 55% to 60%. Wall motion was normal; there were no regional wall motion abnormalities. - Left atrium: No evidence of thrombus in the atrial cavity or appendage. - Right ventricle: The cavity size was mildly dilated. Wall thickness was normal. - Right atrium: No evidence of thrombus in the atrial cavity or appendage. - Tricuspid valve: Flail motion of the posterior leaflet. There is a 8x5 mm vegetation attached to the posterior leaflet, but most of the &quot;mass&quot; described on transthoracic imaging appears to be the flail segment of the valve. There was severe regurgitation directed eccentrically and toward the free wall.  Right chest tube placement for pneumothorax 6/1  Placement of a larger bore chest tube in the right chest 6/3  PICC line placed on 6/9  Consultants:   Infectious disease Cardiothoracic surgery  Antimicrobials:   Daptomycin 6/2> 6/8  Teflaro 5/31> 6/8  Vancomycin 6/8>> will need till July 15   Medications  Scheduled Meds: . acetaminophen  1,000 mg Oral Q6H  . buprenorphine-naloxone  2 tablet Sublingual Daily  . gabapentin  300 mg Oral TID  . heparin  5,000 Units Subcutaneous Q8H  . nicotine  14 mg  Transdermal Daily  . saccharomyces boulardii  250 mg Oral BID   Continuous Infusions: . sodium chloride 250 mL (11/19/16 0700)  . vancomycin Stopped (11/27/16 1048)   PRN Meds:.sodium chloride, albuterol, ALPRAZolam, docusate sodium, ibuprofen, nicotine polacrilex, ondansetron (ZOFRAN) IV, oxyCODONE, sodium chloride flush   Antibiotics   Anti-infectives    Start     Dose/Rate Route Frequency Ordered Stop   11/26/16 2000  vancomycin (VANCOCIN) IVPB 1000 mg/200 mL premix     1,000 mg 200 mL/hr over 60 Minutes Intravenous Every 12 hours 11/26/16 1912     11/24/16 0000  vancomycin (VANCOCIN) IVPB 1000 mg/200 mL premix  Status:  Discontinued     1,000 mg 200 mL/hr over 60 Minutes Intravenous Every 8 hours 11/23/16 1238 11/26/16 0721   11/23/16 1600  vancomycin (VANCOCIN) 1,500 mg in sodium chloride 0.9 % 500 mL IVPB     1,500 mg 250 mL/hr over 120 Minutes Intravenous  Once 11/23/16 1238 11/23/16 1733   11/17/16 1600  DAPTOmycin (CUBICIN)  500 mg in sodium chloride 0.9 % IVPB  Status:  Discontinued     500 mg 220 mL/hr over 30 Minutes Intravenous Every 24 hours 11/17/16 1432 11/23/16 1238   11/17/16 1500  ceftaroline (TEFLARO) 600 mg in sodium chloride 0.9 % 250 mL IVPB  Status:  Discontinued     600 mg 250 mL/hr over 60 Minutes Intravenous Every 12 hours 11/17/16 1435 11/23/16 1238   11/15/16 1800  ceftaroline (TEFLARO) 600 mg in sodium chloride 0.9 % 250 mL IVPB  Status:  Discontinued     600 mg 250 mL/hr over 60 Minutes Intravenous Every 12 hours 11/15/16 1635 11/17/16 1431   11/13/16 1215  vancomycin (VANCOCIN) IVPB 1000 mg/200 mL premix  Status:  Discontinued     1,000 mg 200 mL/hr over 60 Minutes Intravenous Every 8 hours 11/13/16 1203 11/16/16 0850   11/11/16 1100  vancomycin (VANCOCIN) IVPB 750 mg/150 ml premix  Status:  Discontinued     750 mg 150 mL/hr over 60 Minutes Intravenous Every 8 hours 11/11/16 0956 11/13/16 1203   11/11/16 0400  vancomycin (VANCOCIN) 500 mg in sodium  chloride 0.9 % 100 mL IVPB  Status:  Discontinued     500 mg 100 mL/hr over 60 Minutes Intravenous Every 12 hours 11/10/16 1951 11/11/16 0956   11/10/16 2200  ceFEPIme (MAXIPIME) 1 g in dextrose 5 % 50 mL IVPB  Status:  Discontinued     1 g 100 mL/hr over 30 Minutes Intravenous Every 12 hours 11/10/16 1951 11/11/16 0926   11/10/16 2000  ceFEPIme (MAXIPIME) 2 g in dextrose 5 % 50 mL IVPB  Status:  Discontinued     2 g 100 mL/hr over 30 Minutes Intravenous  Once 11/10/16 1952 11/10/16 1954   11/10/16 2000  vancomycin (VANCOCIN) IVPB 1000 mg/200 mL premix  Status:  Discontinued     1,000 mg 200 mL/hr over 60 Minutes Intravenous  Once 11/10/16 1952 11/10/16 1954   11/10/16 1430  piperacillin-tazobactam (ZOSYN) IVPB 3.375 g     3.375 g 100 mL/hr over 30 Minutes Intravenous  Once 11/10/16 1426 11/10/16 1503   11/10/16 1430  vancomycin (VANCOCIN) IVPB 1000 mg/200 mL premix     1,000 mg 200 mL/hr over 60 Minutes Intravenous  Once 11/10/16 1426 11/10/16 1653        Subjective:   Savannah Palmer was seen and examined today. No complaints except feeling more anxious today. Wants to restart buspar back. No fevers or chills. Patient denies dizziness,  shortness of breath, abdominal pain, N/V/D/C, new weakness, numbess, tingling. No acute events overnight.    Objective:   Vitals:   11/26/16 1429 11/26/16 2243 11/27/16 0515 11/27/16 0517  BP: 128/86 120/80 127/68   Pulse: 64 64 (!) 57   Resp: 16 18 16    Temp: 99.5 F (37.5 C) 98.9 F (37.2 C) 99.9 F (37.7 C)   TempSrc: Oral Oral Oral   SpO2: 97% 100% 97%   Weight:    64.5 kg (142 lb 1.6 oz)  Height:        Intake/Output Summary (Last 24 hours) at 11/27/16 1436 Last data filed at 11/27/16 1025  Gross per 24 hour  Intake           1243.5 ml  Output                0 ml  Net           1243.5 ml     Wt Readings from Last 3  Encounters:  11/27/16 64.5 kg (142 lb 1.6 oz)  03/14/16 58.5 kg (129 lb)  01/19/16 58.1 kg (128 lb)      Exam  General: Alert and oriented x 3, NAD  Eyes: EOMI PERRLA  HEENT: Normocephalic atraumatic  Cardiovascular: S1 and S2 clear, RRR systolic murmur in the tricuspid area   Respiratory: CTA B  Gastrointestinal: Soft, NT, ND, NBS  Ext: no pedal edema bilaterally  Neuro: no FND  Musculoskeletal: No cyanosis, clubbing  Skin: No rashes  Psych: slightly anxious, alert and oriented 3   Data Reviewed:  I have personally reviewed following labs and imaging studies  Micro Results Recent Results (from the past 240 hour(s))  Culture, blood (routine x 2)     Status: None   Collection Time: 11/19/16  8:57 AM  Result Value Ref Range Status   Specimen Description BLOOD RIGHT ARM  Final   Special Requests IN PEDIATRIC BOTTLE Blood Culture adequate volume  Final   Culture NO GROWTH 5 DAYS  Final   Report Status 11/24/2016 FINAL  Final  Culture, blood (routine x 2)     Status: None   Collection Time: 11/19/16  9:05 AM  Result Value Ref Range Status   Specimen Description BLOOD RIGHT ANTECUBITAL  Final   Special Requests IN PEDIATRIC BOTTLE Blood Culture adequate volume  Final   Culture NO GROWTH 5 DAYS  Final   Report Status 11/24/2016 FINAL  Final    Radiology Reports Dg Chest 2 View  Result Date: 11/23/2016 CLINICAL DATA:  Chest tube removal EXAM: CHEST  2 VIEW COMPARISON:  11/21/2016 FINDINGS: Interval removal of right chest tube with residual small right pleural effusion versus pleural thickening. No pneumothorax. Patchy bilateral airspace opacities are again noted, unchanged. Heart is borderline in size. IMPRESSION: Interval removal of right chest tube without pneumothorax. Otherwise no change. Electronically Signed   By: Charlett Nose M.D.   On: 11/23/2016 08:44   Dg Chest 2 View  Result Date: 11/21/2016 CLINICAL DATA:  Chest soreness.  Chest tube . EXAM: CHEST  2 VIEW COMPARISON:  CT 11/20/2016.  Chest x-ray 11/20/2016 FINDINGS: Chest tube noted on the right. Tiny right  anterior hydropneumothorax again noted. No interim change . Stable cardiomegaly. Persistent bilateral pulmonary nodules including cavitary nodules. Reference is made to prior CT report. Small right pleural effusion. No pneumothorax. IMPRESSION: 1. Right chest tube in stable position. Tiny right anterior hydropneumothorax again noted. No interim change. 2. Multiple bilateral pulmonary nodules with cavitation. Reference is made to prior CT report of 11/20/2016 . Electronically Signed   By: Maisie Fus  Register   On: 11/21/2016 11:15   Dg Chest 2 View  Result Date: 11/19/2016 CLINICAL DATA:  Pneumothorax. EXAM: CHEST  2 VIEW COMPARISON:  11/19/2016. FINDINGS: Right chest tube in stable position. No significant pneumothorax noted on today's exam. Small right pleural effusion. Bilateral patchy pulmonary infiltrates with cavitation again noted. Small left pleural effusion noted. Heart size stable. No acute bony abnormality . IMPRESSION: 1. Right chest tube in stable position. No pneumothorax noted on today's exam. Small right pleural effusion. 2. Persistent bilateral cavitary pulmonary infiltrates. Small left pleural effusion. Electronically Signed   By: Maisie Fus  Register   On: 11/19/2016 16:53   Dg Chest 2 View  Result Date: 11/10/2016 CLINICAL DATA:  Chest pain, shortness of breath for 1 week EXAM: CHEST  2 VIEW COMPARISON:  CT chest 03/12/2016, chest x-ray 03/12/2016 FINDINGS: There are bilateral nodular airspace opacities throughout the upper and lower lobes  bilaterally. There is no pleural effusion or pneumothorax. The heart and mediastinal contours are unremarkable. The osseous structures are unremarkable. IMPRESSION: New bilateral nodular airspace opacities throughout the upper and lower lobes bilaterally. Findings are concerning for multifocal infection such as septic emboli. Recommend further evaluation with a CT of the chest with intravenous contrast. Electronically Signed   By: Elige Ko   On: 11/10/2016  13:23   Ct Chest Wo Contrast  Result Date: 11/20/2016 CLINICAL DATA:  Follow-up right pleural effusion EXAM: CT CHEST WITHOUT CONTRAST TECHNIQUE: Multidetector CT imaging of the chest was performed following the standard protocol without IV contrast. COMPARISON:  Radiograph 11/20/2016, CT chest 11/10/2016, prior radiographs dating back to 11/10/2016 FINDINGS: Cardiovascular: Limited assessment without intravenous contrast. Small fluid in the superior pericardial recess. There is cardiomegaly. Mediastinum/Nodes: Difficult evaluation without contrast. Mediastinal adenopathy is present right paratracheal soft tissue fullness, possible adenopathy does not appear significantly changed. Midline trachea. Esophagus grossly unremarkable. Lungs/Pleura: Interim placement of a right lower lateral chest tube since the prior CT with the tip terminating along the right upper posterior pleural surface. Small bilateral pleural effusions right greater than left, slightly increased compared to the previous CT. Tiny right anterior hydropneumothorax. Multiple bilateral pulmonary nodules and cavitary lesions. Some of the cavitary lesions have decreased, however if there appears to be increased nodules/disease most notable in the left upper lobe and the right lower lobe. Decreased septal thickening compared to the previous exam. Upper Abdomen: Spleen appears enlarged. Partially visualized surgical clips in the gallbladder fossa. Musculoskeletal: No acute or suspicious bone lesion. IMPRESSION: 1. Interim placement of right-sided chest tube. Small residual pleural effusions right greater than left, mildly increased compared to the prior chest CT. Small right anterior hydropneumothorax. 2. Innumerable bilateral lung nodules and cavitary lesions, suspected to represent septic emboli. This appears increased in the left upper and right lower lobe since the prior CT. 3. Suspect mediastinal adenopathy although evaluation of the mediastinum is  limited without contrast 4. Splenomegaly Electronically Signed   By: Jasmine Pang M.D.   On: 11/20/2016 21:25   Ct Chest Wo Contrast  Result Date: 11/10/2016 CLINICAL DATA:  She c/o some chest discomfrot and shortness of breath x 1 week. She states these are similar s/s she had a year ago, at which time she was dx with "pulmonary abscess". She is in no distress. IV drug user EXAM: CT CHEST WITHOUT CONTRAST TECHNIQUE: Multidetector CT imaging of the chest was performed following the standard protocol without IV contrast. COMPARISON:  Chest CT angiogram dated 03/12/2016. FINDINGS: Cardiovascular: Probable small pericardial effusion and/or pericardial thickening, difficult to characterize without IV contrast. Heart size is within normal limits. Thoracic aorta appears to be normal in caliber. Mediastinum/Nodes: Soft tissue density material within the right upper peritracheal space, also difficult to characterize without IV contrast, presumed lymphadenopathy. Additional mild lymphadenopathy within the anterior mediastinum and aortopulmonary window region. Lungs/Pleura: Numerous cavitary consolidations throughout both lungs, peripherally distributed suggesting septic emboli. More confluent consolidations at each lung base, more likely atelectasis than pneumonia. Small right pleural effusion. Upper Abdomen: Suspected splenomegaly, incompletely imaged. Musculoskeletal: No acute or suspicious osseous finding. IMPRESSION: 1. Numerous cavitary consolidations throughout both lungs, with a peripheral distribution suggesting septic emboli. Largest cavitary consolidation is within the left lower lobe measuring approximately 4 cm greatest dimension. 2. Probable small pericardial effusion and/or pericardial wall thickening, difficult to delineate without IV contrast. 3. Probable mediastinal lymphadenopathy, again difficult to definitively characterize without IV contrast, alternatively confluent fluid or edema within  the  mediastinum. 4. Small right pleural effusion. 5. Probable splenomegaly, incompletely imaged at the lower aspects of this chest CT. 6. Right IJ line in place with tip passing through the right atrium and into the right ventricle. Recommend retracting to the cavoatrial junction for optimal radiographic positioning. These results and recommendations were called by telephone at the time of interpretation on 11/10/2016 at 7:35 pm to Dr. Katrinka Blazing , who verbally acknowledged these results. Electronically Signed   By: Bary Richard M.D.   On: 11/10/2016 19:36   Ct Abdomen Pelvis W Contrast  Result Date: 11/14/2016 CLINICAL DATA:  Right upper quadrant pain times 2-3 days. Polysubstance abuse. Cholecystectomy. History of endocarditis and septic emboli. EXAM: CT ABDOMEN AND PELVIS WITH CONTRAST TECHNIQUE: Multidetector CT imaging of the abdomen and pelvis was performed using the standard protocol following bolus administration of intravenous contrast. CONTRAST:  80mL ISOVUE-300 IOPAMIDOL (ISOVUE-300) INJECTION 61% COMPARISON:  03/12/2016 chest CT FINDINGS: Lower chest: Small right effusion with several bilateral cavitary opacities in both lower lobes concerning for septic emboli or cavitary pneumonia. More confluent airspace opacity in the left lower lobe is also noted. The visualized heart is normal in size. There is no pericardial effusion. No large central pulmonary embolus. Hepatobiliary: Normal appearance of the liver without space-occupying mass or biliary dilatation. Cholecystectomy. Pancreas: No pancreatic mass or ductal dilatation. Mild enlargement of the pancreatic head with surrounding peripancreatic fluid is noted. Correlate to exclude a pancreatitis. Spleen: The spleen is enlarged measuring 15.7 x 5.6 x 17 cm (volume = 780 cm^3) and demonstrates a developmental cleft, partially visualized on prior chest CT and therefore believed less likely to represent a laceration. Adrenals/Urinary Tract: Adrenal glands are  unremarkable. Kidneys are normal, without renal calculi, solid appearing lesions, or hydronephrosis. There appear to be pair of water attenuating cysts in the lower pole the right kidney measuring up to 2 x 1.3 cm in aggregate. Bladder is unremarkable. Stomach/Bowel: Stomach is within normal limits. Appendix appears normal. No evidence of bowel wall thickening, distention, or inflammatory changes. Vascular/Lymphatic: No significant vascular findings are present. No enlarged abdominal or pelvic lymph nodes. Reproductive: Uterus and bilateral adnexa are unremarkable. Other: Moderate volume of ascites.  Anasarca. Musculoskeletal: No acute or significant osseous findings. IMPRESSION: 1. Bibasilar, multifocal cavitary lesions noted within the visualized lower lobes consistent with septic emboli or possibly cavitary pneumonia. More confluent airspace disease in the left lower lobe without cavitation is also present. There is a small right pleural effusion. 2. Anasarca with moderate volume of ascites noted within the abdomen and pelvis. Question right heart dysfunction/failure. 3. Splenomegaly. 4. Water attenuating cysts in the lower pole the right kidney in aggregate measuring 2 x 1.3 cm. 5. Status post cholecystectomy. Electronically Signed   By: Tollie Eth M.D.   On: 11/14/2016 16:26   Dg Chest 1v Repeat Same Day  Result Date: 11/18/2016 CLINICAL DATA:  Chest tube placement EXAM: CHEST - 1 VIEW SAME DAY COMPARISON:  Earlier same day FINDINGS: Large bore chest tube placed on the right along the lateral margin. Marked reduction an amount of right pleural air, nearly completely evacuated. Patchy infiltrates persist in both lower lobes. IMPRESSION: Large bore chest tube placed. Near complete resolution of right pneumothorax. Electronically Signed   By: Paulina Fusi M.D.   On: 11/18/2016 11:40   Dg Chest Port 1 View  Result Date: 11/20/2016 CLINICAL DATA:  27 year old female with pneumothorax. Recent transesophageal  echocardiogram. Subsequent encounter. EXAM: PORTABLE CHEST 1 VIEW COMPARISON:  11/20/2016  8:30 a.m. FINDINGS: Right-sided chest tube remains in place. No pneumothorax detected on this frontal projection. Cavitary lesions bilaterally. Left base consolidation. Atelectasis/pleural thickening right lung base. Cardiomegaly. IMPRESSION: No significant change since exam earlier today. Electronically Signed   By: Lacy Duverney M.D.   On: 11/20/2016 13:55   Dg Chest Port 1 View  Result Date: 11/20/2016 CLINICAL DATA:  Chest tube, shortness of Breath EXAM: PORTABLE CHEST 1 VIEW COMPARISON:  11/19/2016 FINDINGS: Right chest tube remains in place, unchanged. No pneumothorax. Patchy bilateral airspace opacities are again noted, some with cavitation. Small right pleural effusion. Heart is borderline in size. IMPRESSION: Right chest tube remains in place without pneumothorax. Stable patchy bilateral airspace disease, some of which is cavitary. Electronically Signed   By: Charlett Nose M.D.   On: 11/20/2016 08:42   Dg Chest Port 1 View  Result Date: 11/19/2016 CLINICAL DATA:  26 year old female with cavitary lung lesions possibly from septic endocarditis. Subsequent encounter. EXAM: PORTABLE CHEST 1 VIEW COMPARISON:  11/18/2016. FINDINGS: Right-sided chest tube remains in place. Tiny hydropneumothorax versus fluid within minor fissure noted. Multiple cavitary lesions. Right-sided pleural effusion. Basilar atelectasis versus infiltrate greater on the left. Cardiomegaly. Mild scoliosis. IMPRESSION: Right-sided chest tube remains in place. Tiny hydropneumothorax versus fluid within minor fissure. Multiple cavitary lesions. Right-sided pleural effusion. Basilar atelectasis versus infiltrate greater on the left. Electronically Signed   By: Lacy Duverney M.D.   On: 11/19/2016 07:16   Dg Chest Port 1 View  Result Date: 11/18/2016 CLINICAL DATA:  Followup left pneumothorax. Patient with bacterial endocarditis. EXAM: PORTABLE CHEST  1 VIEW COMPARISON:  11/17/2016 and prior studies FINDINGS: A moderate right hydropneumothorax is unchanged in size, but now with pleural fluid in its basilar portion. A right thoracostomy tube is unchanged. Scattered pulmonary opacities/ nodules and left lower lung consolidations/atelectasis again noted. There has been no other interval change. IMPRESSION: Moderate right hydropneumothorax, unchanged in size but now with pleural fluid in its basilar portion. Right thoracostomy tube remains unchanged. Electronically Signed   By: Harmon Pier M.D.   On: 11/18/2016 10:14   Dg Chest Port 1 View  Result Date: 11/17/2016 CLINICAL DATA:  Follow-up right-sided chest tube placement. Initial encounter. EXAM: PORTABLE CHEST 1 VIEW COMPARISON:  Chest radiograph performed 11/16/2016 FINDINGS: The patient's moderate right-sided pneumothorax has decreased in size. The right-sided chest tube is grossly unchanged in appearance. Vascular congestion is noted. Patchy left-sided airspace opacities raise concern for pneumonia. Right-sided airspace opacity may reflect atelectasis. A small left pleural effusion is noted. The cardiomediastinal silhouette is mildly enlarged. No acute osseous abnormalities are identified. IMPRESSION: 1. Moderate right-sided pneumothorax has decreased in size. Right-sided chest tube is grossly unchanged in appearance. 2. Vascular congestion and mild cardiomegaly. Patchy left-sided airspace opacities raise concern for pneumonia. Small left pleural effusion noted. 3. Right-sided airspace opacity may reflect atelectasis. Electronically Signed   By: Roanna Raider M.D.   On: 11/17/2016 00:33   Dg Chest Port 1 View  Result Date: 11/16/2016 CLINICAL DATA:  Chest tube placement for right pneumothorax. EXAM: PORTABLE CHEST 1 VIEW COMPARISON:  Single-view of the chest earlier today and 11/11/2016. FINDINGS: Pigtail catheter is now in place in the right chest. Large right pneumothorax is unchanged. Patchy airspace  disease in cavitary lesions in the left chest persist. Heart size is normal. IMPRESSION: No change in large right pneumothorax after chest tube placement. No change in patchy airspace disease in cavitary lesions on the left. These results were called by telephone at the  time of interpretation on 11/16/2016 at 8:10 pm to Estrella Deedsob McIntosh, RN, who verbally acknowledged these results. Electronically Signed   By: Drusilla Kannerhomas  Dalessio M.D.   On: 11/16/2016 20:12   Dg Chest Port 1 View  Result Date: 11/16/2016 CLINICAL DATA:  Shortness of Breath EXAM: PORTABLE CHEST 1 VIEW COMPARISON:  11/11/2016 FINDINGS: Cardiac shadow is within normal limits. Cavitary nodular densities are noted throughout both lungs similar to that seen on previous CT examination. A new right-sided moderately large pneumothorax is noted with consolidation of the lower lobe on the right. No acute bony abnormality is noted. IMPRESSION: Moderately large right-sided pneumothorax new from the prior exam. Near complete collapse of the right lower lobe is noted. Stable cavitary lesions within both lungs. Critical Value/emergent results were called by telephone at the time of interpretation on 11/16/2016 at 6:24 pm to Dr Leitha BleakKatalina, who verbally acknowledged these results. Electronically Signed   By: Alcide CleverMark  Lukens M.D.   On: 11/16/2016 18:27   Dg Chest Port 1 View  Result Date: 11/11/2016 CLINICAL DATA:  Check right IJ line placement.  Initial encounter. EXAM: PORTABLE CHEST 1 VIEW COMPARISON:  Chest radiograph performed 11/10/2016 FINDINGS: The right IJ line is noted ending at the right atrium. This could be retracted 2-3 cm. Mild vascular congestion is noted. Mild left-sided opacities may reflect atelectasis or mild pneumonia. No pleural effusion or pneumothorax is seen. The cardiomediastinal silhouette is borderline normal in size. No acute osseous abnormalities are identified. IMPRESSION: 1. Right IJ line noted ending at the right atrium. This could be retracted  2-3 cm, as deemed clinically appropriate. 2. Mild vascular congestion noted. 3. Mild left-sided airspace opacities may reflect atelectasis or mild pneumonia. Electronically Signed   By: Roanna RaiderJeffery  Chang M.D.   On: 11/11/2016 00:37   Dg Chest Port 1 View  Result Date: 11/10/2016 CLINICAL DATA:  Retraction of the IJ line about 10 cm EXAM: PORTABLE CHEST 1 VIEW COMPARISON:  CT chest 11/10/2016.  Chest 11/10/2016. FINDINGS: The left central venous catheter tip localizes to the mid/distal right atrial region. If placement at or above the cavoatrial junction is desired, retraction of about 3-4 cm is recommended. No pneumothorax. Shallow inspiration with atelectasis in the lung bases. Borderline heart size. Patchy airspace infiltrates in both lungs could indicate edema, aspiration, or pneumonia. No blunting of costophrenic angles. IMPRESSION: Central venous catheter tip is in the mid/ low right atrial region. If placement at or above the cavoatrial junction is desired, retraction of about 3-4 cm is recommended. Electronically Signed   By: Burman NievesWilliam  Stevens M.D.   On: 11/10/2016 21:35   Dg Chest Portable 1 View  Result Date: 11/10/2016 CLINICAL DATA:  Status post central line placement. EXAM: PORTABLE CHEST 1 VIEW COMPARISON:  Chest x-ray from earlier same day. FINDINGS: Interval placement of a right IJ central line with tip passing to the left of midline and likely in the right ventricle or main pulmonary artery. The bilateral small nodular airspace opacities throughout both lungs are unchanged in the short-term interval. Suspect small bilateral pleural effusions. No pneumothorax seen. IMPRESSION: 1. Right IJ central line placement with tip positioned to the left of midline either within the right ventricle or main pulmonary artery. Recommend retracting at least 10 cm. 2. Bilateral small nodular airspace opacities throughout both lungs are unchanged in the short-term interval. As recommended on earlier chest x-ray  report, would consider chest CT for further characterization. 3. Suspect small bilateral pleural effusions. Electronically Signed   By: Weyman CroonStan  Linde Gillis M.D.   On: 11/10/2016 18:37    Lab Data:  CBC:  Recent Labs Lab 11/21/16 0256 11/22/16 0231 11/24/16 0606 11/25/16 0445 11/26/16 0612  WBC 8.8 7.4 5.4 5.6 5.5  HGB 8.2* 8.5* 7.5* 7.1* 9.5*  HCT 25.7* 26.3* 23.8* 22.7* 29.2*  MCV 85.4 85.7 86.5 88.3 87.7  PLT 411* 407* 360 332 326   Basic Metabolic Panel:  Recent Labs Lab 11/21/16 0256 11/22/16 0231 11/23/16 0254 11/24/16 0606 11/25/16 0445 11/26/16 0612 11/27/16 0400  NA 136 136  --  139 138 139 138  K 3.7 3.7  --  3.4* 3.8 3.6 3.3*  CL 106 105  --  107 107 107 104  CO2 22 22  --  24 26 24 27   GLUCOSE 93 88  --  89 97 86 94  BUN 9 9  --  7 10 10 6   CREATININE 0.95 0.84 0.78 0.79 0.80 0.92  0.95 0.79  CALCIUM 7.9* 8.0*  --  7.9* 8.3* 8.3* 8.4*  MG 1.7  --   --   --   --   --   --   PHOS 4.8*  --   --   --   --   --   --    GFR: Estimated Creatinine Clearance: 96.2 mL/min (by C-G formula based on SCr of 0.79 mg/dL). Liver Function Tests:  Recent Labs Lab 11/21/16 0256  AST 13*  ALT 8*  ALKPHOS 151*  BILITOT 0.4  PROT 5.6*  ALBUMIN 1.5*   No results for input(s): LIPASE, AMYLASE in the last 168 hours. No results for input(s): AMMONIA in the last 168 hours. Coagulation Profile: No results for input(s): INR, PROTIME in the last 168 hours. Cardiac Enzymes: No results for input(s): CKTOTAL, CKMB, CKMBINDEX, TROPONINI in the last 168 hours. BNP (last 3 results) No results for input(s): PROBNP in the last 8760 hours. HbA1C: No results for input(s): HGBA1C in the last 72 hours. CBG: No results for input(s): GLUCAP in the last 168 hours. Lipid Profile: No results for input(s): CHOL, HDL, LDLCALC, TRIG, CHOLHDL, LDLDIRECT in the last 72 hours. Thyroid Function Tests: No results for input(s): TSH, T4TOTAL, FREET4, T3FREE, THYROIDAB in the last 72 hours. Anemia  Panel: No results for input(s): VITAMINB12, FOLATE, FERRITIN, TIBC, IRON, RETICCTPCT in the last 72 hours. Urine analysis:    Component Value Date/Time   COLORURINE YELLOW 11/10/2016 2132   APPEARANCEUR CLEAR 11/10/2016 2132   LABSPEC 1.006 11/10/2016 2132   PHURINE 5.0 11/10/2016 2132   GLUCOSEU NEGATIVE 11/10/2016 2132   HGBUR MODERATE (A) 11/10/2016 2132   BILIRUBINUR NEGATIVE 11/10/2016 2132   KETONESUR NEGATIVE 11/10/2016 2132   PROTEINUR NEGATIVE 11/10/2016 2132   UROBILINOGEN 0.2 08/05/2010 1821   NITRITE NEGATIVE 11/10/2016 2132   LEUKOCYTESUR SMALL (A) 11/10/2016 2132     Gwendalynn Eckstrom M.D. [facility] 11/27/2016, 2:36 PM  Pager: [phone] Between 7am to 7pm - call Pager - [phone]  After 7pm go to [email or web address] - password [email or web address]  Call night coverage person covering after 7pm            [facility]  Patient Demographics  Savannah Palmer, is a 26 y.o. female, DOB - 1990/09/25, WUJ:[id number]  Admit date - 11/10/2016   Admitting Physician Coralyn Helling, MD  Outpatient Primary MD for the patient is Patient, No Pcp Per  Outpatient specialists:   LOS - 17  days   Medical records reviewed and are as summarized below:    No chief complaint on file.      Brief summary    26 year old Caucasian female with a past medical history of anxiety, depression, heroin abuse, presented with weakness, cough and chest pain. Initially was in septic shock and required pressors. Blood cultures grew MRSA. 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 She was placed on IV antibiotics. She also developed a spontaneous right-sided pneumothorax. Chest tube was placed by critical care medicine. She was transferred to [facility] for opinion from cardiothoracic surgery. Patient is not a candidate for surgery at this  time.   Assessment & Plan    Principal Problem: MRSA bacteremia/Septic shock with right TV endocarditis and septic emboli to lungs/MRSA empyema - Patient presented with weakness, coughing and chest pain and septic shock requiring vasopressors. Blood cultures 5/27 grew out MRSA and was found to have septic emboli throughout her lungs.  -Repeat cultures were still positive, blood cultures repeat on 6/4 has been negative to date  - 2-D echo on 5/27 showed tricuspid vegetations with moderate TR. TEE on 6/1 showed large tricuspid vegetation, severe TR with dilated RV along with a loculated left effusion -HIV, Hep C negative. Due to family request, RPR was done and it was negative. - Due to persistent bacteremia patient was started on daptomycin 6/2 and Ceftaroline continued to cover lung infection. - cardiothoracic surgery was also consulted, not a candidate for surgery at this time.  - Per ID recommendations today, DC Ceftaroline and daptomycin, use vancomycin alone, duration through July 15, will need BMP, CBC weekly, trough goal of 15-20. Place PICC line today  Active problems: Right spontaneous pneumothorax- acute respiratory failure/MRSA Empyema -Right empyema with pneumothorax, positive for MRSA. Status post chest tube. - Chest tube removed on 6/7, chest x-ray clear   Acute kidney injury -Baseline creatinine 0.7, creatinine increased to 1.37, likely secondary to dehydration and NSAIDs. - patient received IV fluids, NSAIDs were discontinued, and creatinine function has normalized.   RUQ pain, likely musculoskeletal -CT abdomen and pelvis showed no liver or gallbladder abnormalities, possible musculoskeletal origin, currently stable.    Ascites, anasarca due to right sided hear failure - as a result of severe TR, net positive balance of 13.7 L - limit IVF, hold off on administering diuretics in setting of sepsis and borderline BPs, O2 sats 96% on room air  Splenomegaly - etiology  undetermined- CT on 5/30 revealed that the spleen is enlarged measuring 15.7 x 5.6 x 17 cm (volume= 780 cm^3) - liver is unremarkable on CT  Normocytic Anemia - H&H currently stable, was transfused on 6/5, no overt bleeding noted - Iron was 12. Ferritin 213. B-12 1850. Folate 7.8.  Hypokalemia/Hypophosphatemia/Hypomagnesemia - Resolved  Thrombocytopenia - Resolved  Heroin abuse - cont Suboxone- will need transition to a drug rehab program once stable.   Anxiety - cont Buspar, Neurontin, Xanax  Nicotine dependence -Continue  Nicoderm patch   Code Status: Full CODE STATUS  DVT Prophylaxis:  heparin  Family Communication: Discussed in detail with the patient, all imaging results, lab results explained to the patient and  patient's mother in detail at the bedside   Disposition Plan: Transfer to telemetry floor, will need skilled nursing facility  Time Spent in minutes  25 minutes  Procedures:  TEE  - Left ventricle: The cavity size was normal. Wall thickness was normal. Systolic function was normal. The estimated ejection fraction was in the range of 55% to 60%. Wall motion was normal; there were no regional wall motion abnormalities. - Left atrium: No evidence of thrombus in the atrial cavity or appendage. - Right ventricle: The cavity size was mildly dilated. Wall thickness was normal. - Right atrium: No evidence of thrombus in the atrial cavity or appendage. - Tricuspid valve: Flail motion of the posterior leaflet. There is a 8x5 mm vegetation attached to the posterior leaflet, but most of the &quot;mass&quot; described on transthoracic imaging appears to be the flail segment of the valve. There was severe regurgitation directed eccentrically and toward the free wall.  Right chest tube placement for pneumothorax 6/1  Placement of a larger bore chest tube in the right chest 6/3  Consultants:   Infectious disease Cardiothoracic  surgery  Antimicrobials:   Daptomycin 6/2> 6/8  Teflaro 5/31> 6/8  Vancomycin 6/8>> will need to July 15   Medications  Scheduled Meds: . acetaminophen  1,000 mg Oral Q6H  . buprenorphine-naloxone  2 tablet Sublingual Daily  . gabapentin  300 mg Oral TID  . heparin  5,000 Units Subcutaneous Q8H  . nicotine  14 mg Transdermal Daily  . saccharomyces boulardii  250 mg Oral BID   Continuous Infusions: . sodium chloride 250 mL (11/19/16 0700)  . vancomycin Stopped (11/27/16 1048)   PRN Meds:.sodium chloride, albuterol, ALPRAZolam, docusate sodium, ibuprofen, nicotine polacrilex, ondansetron (ZOFRAN) IV, oxyCODONE, sodium chloride flush   Antibiotics   Anti-infectives    Start     Dose/Rate Route Frequency Ordered Stop   11/26/16 2000  vancomycin (VANCOCIN) IVPB 1000 mg/200 mL premix     1,000 mg 200 mL/hr over 60 Minutes Intravenous Every 12 hours 11/26/16 1912     11/24/16 0000  vancomycin (VANCOCIN) IVPB 1000 mg/200 mL premix  Status:  Discontinued     1,000 mg 200 mL/hr over 60 Minutes Intravenous Every 8 hours 11/23/16 1238 11/26/16 0721   11/23/16 1600  vancomycin (VANCOCIN) 1,500 mg in sodium chloride 0.9 % 500 mL IVPB     1,500 mg 250 mL/hr over 120 Minutes Intravenous  Once 11/23/16 1238 11/23/16 1733   11/17/16 1600  DAPTOmycin (CUBICIN) 500 mg in sodium chloride 0.9 % IVPB  Status:  Discontinued     500 mg 220 mL/hr over 30 Minutes Intravenous Every 24 hours 11/17/16 1432 11/23/16 1238   11/17/16 1500  ceftaroline (TEFLARO) 600 mg in sodium chloride 0.9 % 250 mL IVPB  Status:  Discontinued     600 mg 250 mL/hr over 60 Minutes Intravenous Every 12 hours 11/17/16 1435 11/23/16 1238   11/15/16 1800  ceftaroline (TEFLARO) 600 mg in sodium chloride 0.9 % 250 mL IVPB  Status:  Discontinued     600 mg 250 mL/hr over 60 Minutes Intravenous Every 12 hours 11/15/16 1635 11/17/16 1431   11/13/16 1215  vancomycin (VANCOCIN) IVPB 1000 mg/200 mL premix  Status:  Discontinued      1,000 mg 200 mL/hr over 60 Minutes Intravenous Every 8 hours 11/13/16 1203 11/16/16 0850   11/11/16 1100  vancomycin (VANCOCIN) IVPB 750 mg/150 ml premix  Status:  Discontinued     750 mg 150 mL/hr over 60 Minutes  Intravenous Every 8 hours 11/11/16 0956 11/13/16 1203   11/11/16 0400  vancomycin (VANCOCIN) 500 mg in sodium chloride 0.9 % 100 mL IVPB  Status:  Discontinued     500 mg 100 mL/hr over 60 Minutes Intravenous Every 12 hours 11/10/16 1951 11/11/16 0956   11/10/16 2200  ceFEPIme (MAXIPIME) 1 g in dextrose 5 % 50 mL IVPB  Status:  Discontinued     1 g 100 mL/hr over 30 Minutes Intravenous Every 12 hours 11/10/16 1951 11/11/16 0926   11/10/16 2000  ceFEPIme (MAXIPIME) 2 g in dextrose 5 % 50 mL IVPB  Status:  Discontinued     2 g 100 mL/hr over 30 Minutes Intravenous  Once 11/10/16 1952 11/10/16 1954   11/10/16 2000  vancomycin (VANCOCIN) IVPB 1000 mg/200 mL premix  Status:  Discontinued     1,000 mg 200 mL/hr over 60 Minutes Intravenous  Once 11/10/16 1952 11/10/16 1954   11/10/16 1430  piperacillin-tazobactam (ZOSYN) IVPB 3.375 g     3.375 g 100 mL/hr over 30 Minutes Intravenous  Once 11/10/16 1426 11/10/16 1503   11/10/16 1430  vancomycin (VANCOCIN) IVPB 1000 mg/200 mL premix     1,000 mg 200 mL/hr over 60 Minutes Intravenous  Once 11/10/16 1426 11/10/16 1653        Subjective:   Savannah Palmer was seen and examined today.No fevers or chills, denies any specific complaints. Patient denies dizziness,  shortness of breath, abdominal pain, N/V/D/C, new weakness, numbess, tingling. No acute events overnight.    Objective:   Vitals:   11/26/16 1429 11/26/16 2243 11/27/16 0515 11/27/16 0517  BP: 128/86 120/80 127/68   Pulse: 64 64 (!) 57   Resp: 16 18 16    Temp: 99.5 F (37.5 C) 98.9 F (37.2 C) 99.9 F (37.7 C)   TempSrc: Oral Oral Oral   SpO2: 97% 100% 97%   Weight:    64.5 kg (142 lb 1.6 oz)  Height:        Intake/Output Summary (Last 24 hours) at 11/27/16  1436 Last data filed at 11/27/16 1025  Gross per 24 hour  Intake           1243.5 ml  Output                0 ml  Net           1243.5 ml     Wt Readings from Last 3 Encounters:  11/27/16 64.5 kg (142 lb 1.6 oz)  03/14/16 58.5 kg (129 lb)  01/19/16 58.1 kg (128 lb)     Exam  General: Alert and oriented x 3, NAD  Eyes: PERRLA, EOMI  HEENT:  Atraumatic, normocephalic, normal oropharynx  Cardiovascular: S1 S2 clear, systolic murmur in tricuspid area, min pedal edema   Respiratory: CTAB  Gastrointestinal: SoftNT, ND, NBS  Ext: mild  pedal edema bilaterally  Neuro: no focal neurological deficits  Musculoskeletal: No digital cyanosis, clubbing  Skin: No rashes  Psych: alert and oriented 3, normal affect   Data Reviewed:  I have personally reviewed following labs and imaging studies  Micro Results Recent Results (from the past 240 hour(s))  Culture, blood (routine x 2)     Status: None   Collection Time: 11/19/16  8:57 AM  Result Value Ref Range Status   Specimen Description BLOOD RIGHT ARM  Final   Special Requests IN PEDIATRIC BOTTLE Blood Culture adequate volume  Final   Culture NO GROWTH 5 DAYS  Final   Report  Status 11/24/2016 FINAL  Final  Culture, blood (routine x 2)     Status: None   Collection Time: 11/19/16  9:05 AM  Result Value Ref Range Status   Specimen Description BLOOD RIGHT ANTECUBITAL  Final   Special Requests IN PEDIATRIC BOTTLE Blood Culture adequate volume  Final   Culture NO GROWTH 5 DAYS  Final   Report Status 11/24/2016 FINAL  Final    Radiology Reports Dg Chest 2 View  Result Date: 11/23/2016 CLINICAL DATA:  Chest tube removal EXAM: CHEST  2 VIEW COMPARISON:  11/21/2016 FINDINGS: Interval removal of right chest tube with residual small right pleural effusion versus pleural thickening. No pneumothorax. Patchy bilateral airspace opacities are again noted, unchanged. Heart is borderline in size. IMPRESSION: Interval removal of right  chest tube without pneumothorax. Otherwise no change. Electronically Signed   By: Charlett Nose M.D.   On: 11/23/2016 08:44   Dg Chest 2 View  Result Date: 11/21/2016 CLINICAL DATA:  Chest soreness.  Chest tube . EXAM: CHEST  2 VIEW COMPARISON:  CT 11/20/2016.  Chest x-ray 11/20/2016 FINDINGS: Chest tube noted on the right. Tiny right anterior hydropneumothorax again noted. No interim change . Stable cardiomegaly. Persistent bilateral pulmonary nodules including cavitary nodules. Reference is made to prior CT report. Small right pleural effusion. No pneumothorax. IMPRESSION: 1. Right chest tube in stable position. Tiny right anterior hydropneumothorax again noted. No interim change. 2. Multiple bilateral pulmonary nodules with cavitation. Reference is made to prior CT report of 11/20/2016 . Electronically Signed   By: Maisie Fus  Register   On: 11/21/2016 11:15   Dg Chest 2 View  Result Date: 11/19/2016 CLINICAL DATA:  Pneumothorax. EXAM: CHEST  2 VIEW COMPARISON:  11/19/2016. FINDINGS: Right chest tube in stable position. No significant pneumothorax noted on today's exam. Small right pleural effusion. Bilateral patchy pulmonary infiltrates with cavitation again noted. Small left pleural effusion noted. Heart size stable. No acute bony abnormality . IMPRESSION: 1. Right chest tube in stable position. No pneumothorax noted on today's exam. Small right pleural effusion. 2. Persistent bilateral cavitary pulmonary infiltrates. Small left pleural effusion. Electronically Signed   By: Maisie Fus  Register   On: 11/19/2016 16:53   Dg Chest 2 View  Result Date: 11/10/2016 CLINICAL DATA:  Chest pain, shortness of breath for 1 week EXAM: CHEST  2 VIEW COMPARISON:  CT chest 03/12/2016, chest x-ray 03/12/2016 FINDINGS: There are bilateral nodular airspace opacities throughout the upper and lower lobes bilaterally. There is no pleural effusion or pneumothorax. The heart and mediastinal contours are unremarkable. The osseous  structures are unremarkable. IMPRESSION: New bilateral nodular airspace opacities throughout the upper and lower lobes bilaterally. Findings are concerning for multifocal infection such as septic emboli. Recommend further evaluation with a CT of the chest with intravenous contrast. Electronically Signed   By: Elige Ko   On: 11/10/2016 13:23   Ct Chest Wo Contrast  Result Date: 11/20/2016 CLINICAL DATA:  Follow-up right pleural effusion EXAM: CT CHEST WITHOUT CONTRAST TECHNIQUE: Multidetector CT imaging of the chest was performed following the standard protocol without IV contrast. COMPARISON:  Radiograph 11/20/2016, CT chest 11/10/2016, prior radiographs dating back to 11/10/2016 FINDINGS: Cardiovascular: Limited assessment without intravenous contrast. Small fluid in the superior pericardial recess. There is cardiomegaly. Mediastinum/Nodes: Difficult evaluation without contrast. Mediastinal adenopathy is present right paratracheal soft tissue fullness, possible adenopathy does not appear significantly changed. Midline trachea. Esophagus grossly unremarkable. Lungs/Pleura: Interim placement of a right lower lateral chest tube since the prior CT with  the tip terminating along the right upper posterior pleural surface. Small bilateral pleural effusions right greater than left, slightly increased compared to the previous CT. Tiny right anterior hydropneumothorax. Multiple bilateral pulmonary nodules and cavitary lesions. Some of the cavitary lesions have decreased, however if there appears to be increased nodules/disease most notable in the left upper lobe and the right lower lobe. Decreased septal thickening compared to the previous exam. Upper Abdomen: Spleen appears enlarged. Partially visualized surgical clips in the gallbladder fossa. Musculoskeletal: No acute or suspicious bone lesion. IMPRESSION: 1. Interim placement of right-sided chest tube. Small residual pleural effusions right greater than left,  mildly increased compared to the prior chest CT. Small right anterior hydropneumothorax. 2. Innumerable bilateral lung nodules and cavitary lesions, suspected to represent septic emboli. This appears increased in the left upper and right lower lobe since the prior CT. 3. Suspect mediastinal adenopathy although evaluation of the mediastinum is limited without contrast 4. Splenomegaly Electronically Signed   By: Jasmine Pang M.D.   On: 11/20/2016 21:25   Ct Chest Wo Contrast  Result Date: 11/10/2016 CLINICAL DATA:  She c/o some chest discomfrot and shortness of breath x 1 week. She states these are similar s/s she had a year ago, at which time she was dx with "pulmonary abscess". She is in no distress. IV drug user EXAM: CT CHEST WITHOUT CONTRAST TECHNIQUE: Multidetector CT imaging of the chest was performed following the standard protocol without IV contrast. COMPARISON:  Chest CT angiogram dated 03/12/2016. FINDINGS: Cardiovascular: Probable small pericardial effusion and/or pericardial thickening, difficult to characterize without IV contrast. Heart size is within normal limits. Thoracic aorta appears to be normal in caliber. Mediastinum/Nodes: Soft tissue density material within the right upper peritracheal space, also difficult to characterize without IV contrast, presumed lymphadenopathy. Additional mild lymphadenopathy within the anterior mediastinum and aortopulmonary window region. Lungs/Pleura: Numerous cavitary consolidations throughout both lungs, peripherally distributed suggesting septic emboli. More confluent consolidations at each lung base, more likely atelectasis than pneumonia. Small right pleural effusion. Upper Abdomen: Suspected splenomegaly, incompletely imaged. Musculoskeletal: No acute or suspicious osseous finding. IMPRESSION: 1. Numerous cavitary consolidations throughout both lungs, with a peripheral distribution suggesting septic emboli. Largest cavitary consolidation is within the  left lower lobe measuring approximately 4 cm greatest dimension. 2. Probable small pericardial effusion and/or pericardial wall thickening, difficult to delineate without IV contrast. 3. Probable mediastinal lymphadenopathy, again difficult to definitively characterize without IV contrast, alternatively confluent fluid or edema within the mediastinum. 4. Small right pleural effusion. 5. Probable splenomegaly, incompletely imaged at the lower aspects of this chest CT. 6. Right IJ line in place with tip passing through the right atrium and into the right ventricle. Recommend retracting to the cavoatrial junction for optimal radiographic positioning. These results and recommendations were called by telephone at the time of interpretation on 11/10/2016 at 7:35 pm to Dr. Katrinka Blazing , who verbally acknowledged these results. Electronically Signed   By: Bary Richard M.D.   On: 11/10/2016 19:36   Ct Abdomen Pelvis W Contrast  Result Date: 11/14/2016 CLINICAL DATA:  Right upper quadrant pain times 2-3 days. Polysubstance abuse. Cholecystectomy. History of endocarditis and septic emboli. EXAM: CT ABDOMEN AND PELVIS WITH CONTRAST TECHNIQUE: Multidetector CT imaging of the abdomen and pelvis was performed using the standard protocol following bolus administration of intravenous contrast. CONTRAST:  80mL ISOVUE-300 IOPAMIDOL (ISOVUE-300) INJECTION 61% COMPARISON:  03/12/2016 chest CT FINDINGS: Lower chest: Small right effusion with several bilateral cavitary opacities in both lower lobes concerning for  septic emboli or cavitary pneumonia. More confluent airspace opacity in the left lower lobe is also noted. The visualized heart is normal in size. There is no pericardial effusion. No large central pulmonary embolus. Hepatobiliary: Normal appearance of the liver without space-occupying mass or biliary dilatation. Cholecystectomy. Pancreas: No pancreatic mass or ductal dilatation. Mild enlargement of the pancreatic head with  surrounding peripancreatic fluid is noted. Correlate to exclude a pancreatitis. Spleen: The spleen is enlarged measuring 15.7 x 5.6 x 17 cm (volume = 780 cm^3) and demonstrates a developmental cleft, partially visualized on prior chest CT and therefore believed less likely to represent a laceration. Adrenals/Urinary Tract: Adrenal glands are unremarkable. Kidneys are normal, without renal calculi, solid appearing lesions, or hydronephrosis. There appear to be pair of water attenuating cysts in the lower pole the right kidney measuring up to 2 x 1.3 cm in aggregate. Bladder is unremarkable. Stomach/Bowel: Stomach is within normal limits. Appendix appears normal. No evidence of bowel wall thickening, distention, or inflammatory changes. Vascular/Lymphatic: No significant vascular findings are present. No enlarged abdominal or pelvic lymph nodes. Reproductive: Uterus and bilateral adnexa are unremarkable. Other: Moderate volume of ascites.  Anasarca. Musculoskeletal: No acute or significant osseous findings. IMPRESSION: 1. Bibasilar, multifocal cavitary lesions noted within the visualized lower lobes consistent with septic emboli or possibly cavitary pneumonia. More confluent airspace disease in the left lower lobe without cavitation is also present. There is a small right pleural effusion. 2. Anasarca with moderate volume of ascites noted within the abdomen and pelvis. Question right heart dysfunction/failure. 3. Splenomegaly. 4. Water attenuating cysts in the lower pole the right kidney in aggregate measuring 2 x 1.3 cm. 5. Status post cholecystectomy. Electronically Signed   By: Tollie Eth M.D.   On: 11/14/2016 16:26   Dg Chest 1v Repeat Same Day  Result Date: 11/18/2016 CLINICAL DATA:  Chest tube placement EXAM: CHEST - 1 VIEW SAME DAY COMPARISON:  Earlier same day FINDINGS: Large bore chest tube placed on the right along the lateral margin. Marked reduction an amount of right pleural air, nearly completely  evacuated. Patchy infiltrates persist in both lower lobes. IMPRESSION: Large bore chest tube placed. Near complete resolution of right pneumothorax. Electronically Signed   By: Paulina Fusi M.D.   On: 11/18/2016 11:40   Dg Chest Port 1 View  Result Date: 11/20/2016 CLINICAL DATA:  26 year old female with pneumothorax. Recent transesophageal echocardiogram. Subsequent encounter. EXAM: PORTABLE CHEST 1 VIEW COMPARISON:  11/20/2016 8:30 a.m. FINDINGS: Right-sided chest tube remains in place. No pneumothorax detected on this frontal projection. Cavitary lesions bilaterally. Left base consolidation. Atelectasis/pleural thickening right lung base. Cardiomegaly. IMPRESSION: No significant change since exam earlier today. Electronically Signed   By: Lacy Duverney M.D.   On: 11/20/2016 13:55   Dg Chest Port 1 View  Result Date: 11/20/2016 CLINICAL DATA:  Chest tube, shortness of Breath EXAM: PORTABLE CHEST 1 VIEW COMPARISON:  11/19/2016 FINDINGS: Right chest tube remains in place, unchanged. No pneumothorax. Patchy bilateral airspace opacities are again noted, some with cavitation. Small right pleural effusion. Heart is borderline in size. IMPRESSION: Right chest tube remains in place without pneumothorax. Stable patchy bilateral airspace disease, some of which is cavitary. Electronically Signed   By: Charlett Nose M.D.   On: 11/20/2016 08:42   Dg Chest Port 1 View  Result Date: 11/19/2016 CLINICAL DATA:  26 year old female with cavitary lung lesions possibly from septic endocarditis. Subsequent encounter. EXAM: PORTABLE CHEST 1 VIEW COMPARISON:  11/18/2016. FINDINGS: Right-sided chest tube remains  in place. Tiny hydropneumothorax versus fluid within minor fissure noted. Multiple cavitary lesions. Right-sided pleural effusion. Basilar atelectasis versus infiltrate greater on the left. Cardiomegaly. Mild scoliosis. IMPRESSION: Right-sided chest tube remains in place. Tiny hydropneumothorax versus fluid within minor  fissure. Multiple cavitary lesions. Right-sided pleural effusion. Basilar atelectasis versus infiltrate greater on the left. Electronically Signed   By: Lacy Duverney M.D.   On: 11/19/2016 07:16   Dg Chest Port 1 View  Result Date: 11/18/2016 CLINICAL DATA:  Followup left pneumothorax. Patient with bacterial endocarditis. EXAM: PORTABLE CHEST 1 VIEW COMPARISON:  11/17/2016 and prior studies FINDINGS: A moderate right hydropneumothorax is unchanged in size, but now with pleural fluid in its basilar portion. A right thoracostomy tube is unchanged. Scattered pulmonary opacities/ nodules and left lower lung consolidations/atelectasis again noted. There has been no other interval change. IMPRESSION: Moderate right hydropneumothorax, unchanged in size but now with pleural fluid in its basilar portion. Right thoracostomy tube remains unchanged. Electronically Signed   By: Harmon Pier M.D.   On: 11/18/2016 10:14   Dg Chest Port 1 View  Result Date: 11/17/2016 CLINICAL DATA:  Follow-up right-sided chest tube placement. Initial encounter. EXAM: PORTABLE CHEST 1 VIEW COMPARISON:  Chest radiograph performed 11/16/2016 FINDINGS: The patient's moderate right-sided pneumothorax has decreased in size. The right-sided chest tube is grossly unchanged in appearance. Vascular congestion is noted. Patchy left-sided airspace opacities raise concern for pneumonia. Right-sided airspace opacity may reflect atelectasis. A small left pleural effusion is noted. The cardiomediastinal silhouette is mildly enlarged. No acute osseous abnormalities are identified. IMPRESSION: 1. Moderate right-sided pneumothorax has decreased in size. Right-sided chest tube is grossly unchanged in appearance. 2. Vascular congestion and mild cardiomegaly. Patchy left-sided airspace opacities raise concern for pneumonia. Small left pleural effusion noted. 3. Right-sided airspace opacity may reflect atelectasis. Electronically Signed   By: Roanna Raider M.D.    On: 11/17/2016 00:33   Dg Chest Port 1 View  Result Date: 11/16/2016 CLINICAL DATA:  Chest tube placement for right pneumothorax. EXAM: PORTABLE CHEST 1 VIEW COMPARISON:  Single-view of the chest earlier today and 11/11/2016. FINDINGS: Pigtail catheter is now in place in the right chest. Large right pneumothorax is unchanged. Patchy airspace disease in cavitary lesions in the left chest persist. Heart size is normal. IMPRESSION: No change in large right pneumothorax after chest tube placement. No change in patchy airspace disease in cavitary lesions on the left. These results were called by telephone at the time of interpretation on 11/16/2016 at 8:10 pm to Estrella Deeds, RN, who verbally acknowledged these results. Electronically Signed   By: Drusilla Kanner M.D.   On: 11/16/2016 20:12   Dg Chest Port 1 View  Result Date: 11/16/2016 CLINICAL DATA:  Shortness of Breath EXAM: PORTABLE CHEST 1 VIEW COMPARISON:  11/11/2016 FINDINGS: Cardiac shadow is within normal limits. Cavitary nodular densities are noted throughout both lungs similar to that seen on previous CT examination. A new right-sided moderately large pneumothorax is noted with consolidation of the lower lobe on the right. No acute bony abnormality is noted. IMPRESSION: Moderately large right-sided pneumothorax new from the prior exam. Near complete collapse of the right lower lobe is noted. Stable cavitary lesions within both lungs. Critical Value/emergent results were called by telephone at the time of interpretation on 11/16/2016 at 6:24 pm to Dr Leitha Bleak, who verbally acknowledged these results. Electronically Signed   By: Alcide Clever M.D.   On: 11/16/2016 18:27   Dg Chest Port 1 View  Result Date: 11/11/2016  CLINICAL DATA:  Check right IJ line placement.  Initial encounter. EXAM: PORTABLE CHEST 1 VIEW COMPARISON:  Chest radiograph performed 11/10/2016 FINDINGS: The right IJ line is noted ending at the right atrium. This could be retracted 2-3 cm.  Mild vascular congestion is noted. Mild left-sided opacities may reflect atelectasis or mild pneumonia. No pleural effusion or pneumothorax is seen. The cardiomediastinal silhouette is borderline normal in size. No acute osseous abnormalities are identified. IMPRESSION: 1. Right IJ line noted ending at the right atrium. This could be retracted 2-3 cm, as deemed clinically appropriate. 2. Mild vascular congestion noted. 3. Mild left-sided airspace opacities may reflect atelectasis or mild pneumonia. Electronically Signed   By: Roanna Raider M.D.   On: 11/11/2016 00:37   Dg Chest Port 1 View  Result Date: 11/10/2016 CLINICAL DATA:  Retraction of the IJ line about 10 cm EXAM: PORTABLE CHEST 1 VIEW COMPARISON:  CT chest 11/10/2016.  Chest 11/10/2016. FINDINGS: The left central venous catheter tip localizes to the mid/distal right atrial region. If placement at or above the cavoatrial junction is desired, retraction of about 3-4 cm is recommended. No pneumothorax. Shallow inspiration with atelectasis in the lung bases. Borderline heart size. Patchy airspace infiltrates in both lungs could indicate edema, aspiration, or pneumonia. No blunting of costophrenic angles. IMPRESSION: Central venous catheter tip is in the mid/ low right atrial region. If placement at or above the cavoatrial junction is desired, retraction of about 3-4 cm is recommended. Electronically Signed   By: Burman Nieves M.D.   On: 11/10/2016 21:35   Dg Chest Portable 1 View  Result Date: 11/10/2016 CLINICAL DATA:  Status post central line placement. EXAM: PORTABLE CHEST 1 VIEW COMPARISON:  Chest x-ray from earlier same day. FINDINGS: Interval placement of a right IJ central line with tip passing to the left of midline and likely in the right ventricle or main pulmonary artery. The bilateral small nodular airspace opacities throughout both lungs are unchanged in the short-term interval. Suspect small bilateral pleural effusions. No  pneumothorax seen. IMPRESSION: 1. Right IJ central line placement with tip positioned to the left of midline either within the right ventricle or main pulmonary artery. Recommend retracting at least 10 cm. 2. Bilateral small nodular airspace opacities throughout both lungs are unchanged in the short-term interval. As recommended on earlier chest x-ray report, would consider chest CT for further characterization. 3. Suspect small bilateral pleural effusions. Electronically Signed   By: Bary Richard M.D.   On: 11/10/2016 18:37    Lab Data:  CBC:  Recent Labs Lab 11/21/16 0256 11/22/16 0231 11/24/16 0606 11/25/16 0445 11/26/16 0612  WBC 8.8 7.4 5.4 5.6 5.5  HGB 8.2* 8.5* 7.5* 7.1* 9.5*  HCT 25.7* 26.3* 23.8* 22.7* 29.2*  MCV 85.4 85.7 86.5 88.3 87.7  PLT 411* 407* 360 332 326   Basic Metabolic Panel:  Recent Labs Lab 11/21/16 0256 11/22/16 0231 11/23/16 0254 11/24/16 0606 11/25/16 0445 11/26/16 0612 11/27/16 0400  NA 136 136  --  139 138 139 138  K 3.7 3.7  --  3.4* 3.8 3.6 3.3*  CL 106 105  --  107 107 107 104  CO2 22 22  --  24 26 24 27   GLUCOSE 93 88  --  89 97 86 94  BUN 9 9  --  7 10 10 6   CREATININE 0.95 0.84 0.78 0.79 0.80 0.92  0.95 0.79  CALCIUM 7.9* 8.0*  --  7.9* 8.3* 8.3* 8.4*  MG 1.7  --   --   --   --   --   --  PHOS 4.8*  --   --   --   --   --   --    GFR: Estimated Creatinine Clearance: 96.2 mL/min (by C-G formula based on SCr of 0.79 mg/dL). Liver Function Tests:  Recent Labs Lab 11/21/16 0256  AST 13*  ALT 8*  ALKPHOS 151*  BILITOT 0.4  PROT 5.6*  ALBUMIN 1.5*   No results for input(s): LIPASE, AMYLASE in the last 168 hours. No results for input(s): AMMONIA in the last 168 hours. Coagulation Profile: No results for input(s): INR, PROTIME in the last 168 hours. Cardiac Enzymes: No results for input(s): CKTOTAL, CKMB, CKMBINDEX, TROPONINI in the last 168 hours. BNP (last 3 results) No results for input(s): PROBNP in the last 8760  hours. HbA1C: No results for input(s): HGBA1C in the last 72 hours. CBG: No results for input(s): GLUCAP in the last 168 hours. Lipid Profile: No results for input(s): CHOL, HDL, LDLCALC, TRIG, CHOLHDL, LDLDIRECT in the last 72 hours. Thyroid Function Tests: No results for input(s): TSH, T4TOTAL, FREET4, T3FREE, THYROIDAB in the last 72 hours. Anemia Panel: No results for input(s): VITAMINB12, FOLATE, FERRITIN, TIBC, IRON, RETICCTPCT in the last 72 hours. Urine analysis:    Component Value Date/Time   COLORURINE YELLOW 11/10/2016 2132   APPEARANCEUR CLEAR 11/10/2016 2132   LABSPEC 1.006 11/10/2016 2132   PHURINE 5.0 11/10/2016 2132   GLUCOSEU NEGATIVE 11/10/2016 2132   HGBUR MODERATE (A) 11/10/2016 2132   BILIRUBINUR NEGATIVE 11/10/2016 2132   KETONESUR NEGATIVE 11/10/2016 2132   PROTEINUR NEGATIVE 11/10/2016 2132   UROBILINOGEN 0.2 08/05/2010 1821   NITRITE NEGATIVE 11/10/2016 2132   LEUKOCYTESUR SMALL (A) 11/10/2016 2132     Montae Stager M.D. [facility] 11/27/2016, 2:36 PM  Pager: [phone] Between 7am to 7pm - call Pager - [phone]  After 7pm go to [email or web address] - password [email or web address]  Call night coverage person covering after 7pm

## 2016-11-27 NOTE — Progress Notes (Addendum)
6/13 3pm: [facility].  11:24am- [facility] has not called CSW back and [facility] and [facility] are reviewing referral.   6/12 9:13am: [facility] is unable to accept patient due to Suboxone. [facility] is not in network with [employer].  8am: CSW sent referral to [facility] facilities for review.  Osborne Cascoadia Momoko Slezak LCSWA [phone]

## 2016-11-27 NOTE — Progress Notes (Signed)
Mother of patient was recording a provider. Provider asked her to not record staff, mother agreed not to. Nurse reinforcement and video policy provided.

## 2016-11-28 LAB — BASIC METABOLIC PANEL
Anion gap: 7 (ref 5–15)
BUN: 8 mg/dL (ref 6–20)
CHLORIDE: 105 mmol/L (ref 101–111)
CO2: 26 mmol/L (ref 22–32)
CREATININE: 0.78 mg/dL (ref 0.44–1.00)
Calcium: 8 mg/dL — ABNORMAL LOW (ref 8.9–10.3)
GFR calc non Af Amer: >60 mL/min (ref >60)
Glucose, Bld: 98 mg/dL (ref 65–99)
Potassium: 3.5 mmol/L (ref 3.5–5.1)
SODIUM: 138 mmol/L (ref 135–145)

## 2016-11-28 LAB — CBC
HCT: 30.4 % — ABNORMAL LOW (ref 36.0–46.0)
Hemoglobin: 9.7 g/dL — ABNORMAL LOW (ref 12.0–15.0)
MCH: 27.6 pg (ref 26.0–34.0)
MCHC: 31.9 g/dL (ref 30.0–36.0)
MCV: 86.6 fL (ref 78.0–100.0)
PLATELETS: 329 10*3/uL (ref 150–400)
RBC: 3.51 MIL/uL — AB (ref 3.87–5.11)
RDW: 15.3 % (ref 11.5–15.5)
WBC: 6.8 10*3/uL (ref 4.0–10.5)

## 2016-11-28 MED ORDER — FUROSEMIDE 20 MG PO TABS
20.0000 mg | ORAL_TABLET | Freq: Once | ORAL | Status: AC
  Administered 2016-11-28: 20 mg via ORAL
  Filled 2016-11-28: qty 1

## 2016-11-28 NOTE — Progress Notes (Signed)
Pt c/o feeling "swollen" in abdomen and back and having  pain on inhalation abdomen tight on palpation and lungs sounded clear .Pt stated "feels like there is fluid"  Pt requested lasix saying it helped her in past when this has happened to her. Dr. Jomarie Longsjoseph notified and verbal order placed for 20 mg po lasix one time. Will continue to monitor

## 2016-11-28 NOTE — Progress Notes (Signed)
[facility]                                                                              Patient Demographics  Savannah Palmer, is a 26 y.o. female, DOB - [birth year], ZOX:[id number]  Admit date - 11/10/2016   Admitting Physician Coralyn Helling, MD  Outpatient Primary MD for the patient is Patient, No Pcp Per  Outpatient specialists:   LOS - 18  days   Medical records reviewed and are as summarized below:    No chief complaint on file.      Brief summary    26 year old Caucasian female with a past medical history of anxiety, depression, heroin abuse, presented with weakness, cough and chest pain. Initially was in septic shock and required pressors. Blood cultures grew MRSA. 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 She was placed on IV antibiotics. She also developed a spontaneous right-sided pneumothorax. Chest tube was placed by critical care medicine. She was transferred to [facility] for opinion from cardiothoracic surgery. Patient is not a candidate for surgery at this time.   Assessment & Plan    Principal Problem: MRSA bacteremia/Septic shock with right TV endocarditis and septic emboli to lungs/MRSA empyema - Patient presented with weakness, coughing, chest pain and septic shock requiring vasopressors. Blood cultures 5/27 grew out MRSA and was found to have septic emboli throughout her lungs.  -Repeat cultures were still positive, blood cultures repeat on 6/4 has been negative to date  - 2-D echo on 5/27 showed tricuspid vegetations with moderate TR. TEE on 6/1 showed large tricuspid vegetation, severe TR with dilated RV along with a loculated left effusion - HIV, Hep C negative. Due to family request, RPR was done and it was negative. - Due to persistent bacteremia patient was started on daptomycin 6/2 and Ceftaroline was continued to cover lung infection. Per ID  recommendations, Ceftaroline and daptomycin have been now discontinued. - cardiothoracic surgery was also consulted and patient was deemed not a candidate for surgery at this time.  - on vancomycin duration through July 15, will need BMP, CBC weekly, trough goal of 15-20.  - PICC line placed on 6/9, continue vancomycin, until July 15. Vanco trough in goal 19 -  Pending skilled nursing facility placement   Active problems: Right spontaneous pneumothorax- acute respiratory failure/MRSA Empyema -Right empyema with pneumothorax, positive for MRSA. Status post chest tube. - Chest tube removed on 6/7, chest x-ray clear   Acute kidney injury -Baseline creatinine 0.7, creatinine increased to 1.37, likely secondary to dehydration and NSAIDs. - patient received IV fluids, NSAIDs were discontinued, and creatinine function has normalized.   RUQ pain, likely musculoskeletal -CT abdomen and pelvis showed no liver or gallbladder abnormalities, possible musculoskeletal origin, currently stable.    Ascites, anasarca due to right sided hear failure - as a result of severe TR, net positive balance of 12L - O2 sats 96% on room air  Splenomegaly - etiology undetermined- CT on 5/30 revealed that the spleen is enlarged measuring 15.7 x 5.6 x 17 cm (volume= 780 cm^3) - liver  is unremarkable on CT  Normocytic Anemia - H&H currently stable, was transfused on 6/5, no overt bleeding noted - Iron was 12. Ferritin 213. B-12 1850. Folate 7.8. - Hemoglobin down to 7.1 on 6/10, patient was transfused 2 units packed RBC with 1 dose of IV Feraheme, currently stable  Hypokalemia/Hypophosphatemia/Hypomagnesemia - Potassium 3.3, replaced  Thrombocytopenia - Resolved  Heroin abuse - cont Suboxone, will need transition to a drug rehab program once stable.  Anxiety - Celexa discontinued per patient's request. Buspar restarted again today per patient's request.  - continue Neurontin and Xanax as needed at  this time.   Nicotine dependence -Continue  Nicoderm patch   Code Status: Full CODE STATUS  DVT Prophylaxis:  heparin  Family Communication: Discussed in detail with the patient, all imaging results, lab results explained to the patient and patient's mother at the bedside. Requested patient's mother not to video record any staff or providers.    Disposition Plan: Hopefully DC to skilled nursing facility when a bed is available. Disposition difficulty due to patient being on suboxone and history of IVDA   Time Spent in minutes  25 minutes  Procedures:  TEE  - Left ventricle: The cavity size was normal. Wall thickness was normal. Systolic function was normal. The estimated ejection fraction was in the range of 55% to 60%. Wall motion was normal; there were no regional wall motion abnormalities. - Left atrium: No evidence of thrombus in the atrial cavity or appendage. - Right ventricle: The cavity size was mildly dilated. Wall thickness was normal. - Right atrium: No evidence of thrombus in the atrial cavity or appendage. - Tricuspid valve: Flail motion of the posterior leaflet. There is a 8x5 mm vegetation attached to the posterior leaflet, but most of the &quot;mass&quot; described on transthoracic imaging appears to be the flail segment of the valve. There was severe regurgitation directed eccentrically and toward the free wall.  Right chest tube placement for pneumothorax 6/1  Placement of a larger bore chest tube in the right chest 6/3  PICC line placed on 6/9  Consultants:   Infectious disease Cardiothoracic surgery  Antimicrobials:   Daptomycin 6/2> 6/8  Teflaro 5/31> 6/8  Vancomycin 6/8>> will need till July 15   Medications  Scheduled Meds: . acetaminophen  1,000 mg Oral Q6H  . buprenorphine-naloxone  2 tablet Sublingual Daily  . busPIRone  10 mg Oral BID  . gabapentin  300 mg Oral TID  . heparin  5,000 Units Subcutaneous Q8H    . nicotine  14 mg Transdermal Daily  . saccharomyces boulardii  250 mg Oral BID   Continuous Infusions: . sodium chloride 250 mL (11/19/16 0700)  . vancomycin 1,000 mg (11/28/16 0917)   PRN Meds:.sodium chloride, albuterol, ALPRAZolam, docusate sodium, ibuprofen, nicotine polacrilex, ondansetron (ZOFRAN) IV, oxyCODONE, sodium chloride flush   Antibiotics   Anti-infectives    Start     Dose/Rate Route Frequency Ordered Stop   11/26/16 2000  vancomycin (VANCOCIN) IVPB 1000 mg/200 mL premix     1,000 mg 200 mL/hr over 60 Minutes Intravenous Every 12 hours 11/26/16 1912     11/24/16 0000  vancomycin (VANCOCIN) IVPB 1000 mg/200 mL premix  Status:  Discontinued     1,000 mg 200 mL/hr over 60 Minutes Intravenous Every 8 hours 11/23/16 1238 11/26/16 0721   11/23/16 1600  vancomycin (VANCOCIN) 1,500 mg in sodium chloride 0.9 % 500 mL IVPB     1,500 mg 250 mL/hr over 120 Minutes Intravenous  Once 11/23/16  1238 11/23/16 1733   11/17/16 1600  DAPTOmycin (CUBICIN) 500 mg in sodium chloride 0.9 % IVPB  Status:  Discontinued     500 mg 220 mL/hr over 30 Minutes Intravenous Every 24 hours 11/17/16 1432 11/23/16 1238   11/17/16 1500  ceftaroline (TEFLARO) 600 mg in sodium chloride 0.9 % 250 mL IVPB  Status:  Discontinued     600 mg 250 mL/hr over 60 Minutes Intravenous Every 12 hours 11/17/16 1435 11/23/16 1238   11/15/16 1800  ceftaroline (TEFLARO) 600 mg in sodium chloride 0.9 % 250 mL IVPB  Status:  Discontinued     600 mg 250 mL/hr over 60 Minutes Intravenous Every 12 hours 11/15/16 1635 11/17/16 1431   11/13/16 1215  vancomycin (VANCOCIN) IVPB 1000 mg/200 mL premix  Status:  Discontinued     1,000 mg 200 mL/hr over 60 Minutes Intravenous Every 8 hours 11/13/16 1203 11/16/16 0850   11/11/16 1100  vancomycin (VANCOCIN) IVPB 750 mg/150 ml premix  Status:  Discontinued     750 mg 150 mL/hr over 60 Minutes Intravenous Every 8 hours 11/11/16 0956 11/13/16 1203   11/11/16 0400  vancomycin  (VANCOCIN) 500 mg in sodium chloride 0.9 % 100 mL IVPB  Status:  Discontinued     500 mg 100 mL/hr over 60 Minutes Intravenous Every 12 hours 11/10/16 1951 11/11/16 0956   11/10/16 2200  ceFEPIme (MAXIPIME) 1 g in dextrose 5 % 50 mL IVPB  Status:  Discontinued     1 g 100 mL/hr over 30 Minutes Intravenous Every 12 hours 11/10/16 1951 11/11/16 0926   11/10/16 2000  ceFEPIme (MAXIPIME) 2 g in dextrose 5 % 50 mL IVPB  Status:  Discontinued     2 g 100 mL/hr over 30 Minutes Intravenous  Once 11/10/16 1952 11/10/16 1954   11/10/16 2000  vancomycin (VANCOCIN) IVPB 1000 mg/200 mL premix  Status:  Discontinued     1,000 mg 200 mL/hr over 60 Minutes Intravenous  Once 11/10/16 1952 11/10/16 1954   11/10/16 1430  piperacillin-tazobactam (ZOSYN) IVPB 3.375 g     3.375 g 100 mL/hr over 30 Minutes Intravenous  Once 11/10/16 1426 11/10/16 1503   11/10/16 1430  vancomycin (VANCOCIN) IVPB 1000 mg/200 mL premix     1,000 mg 200 mL/hr over 60 Minutes Intravenous  Once 11/10/16 1426 11/10/16 1653        Subjective:   Adria Devon was seen and examined today. No complaints except feeling more anxious today. Wants to restart buspar back. No fevers or chills. Patient denies dizziness,  shortness of breath, abdominal pain, N/V/D/C, new weakness, numbess, tingling. No acute events overnight.    Objective:   Vitals:   11/27/16 0517 11/27/16 1450 11/27/16 2200 11/28/16 0541  BP:  118/86 (!) 148/93 136/81  Pulse:  (!) 58 63 (!) 57  Resp:  16 18 18   Temp:  98.5 F (36.9 C) 98.9 F (37.2 C) 98.4 F (36.9 C)  TempSrc:      SpO2:  96% 96% 98%  Weight: 64.5 kg (142 lb 1.6 oz)   63.5 kg (140 lb 1.6 oz)  Height:        Intake/Output Summary (Last 24 hours) at 11/28/16 1142 Last data filed at 11/28/16 0410  Gross per 24 hour  Intake              230 ml  Output                1 ml  Net  229 ml     Wt Readings from Last 3 Encounters:  11/28/16 63.5 kg (140 lb 1.6 oz)  03/14/16 58.5  kg (129 lb)  01/19/16 58.1 kg (128 lb)     Exam  General: Alert and oriented x 3, NAD  Eyes: EOMI PERRLA  HEENT: Normocephalic atraumatic  Cardiovascular: S1 and S2 clear, RRR systolic murmur in the tricuspid area   Respiratory: CTA B  Gastrointestinal: Soft, NT, ND, NBS  Ext: no pedal edema bilaterally  Neuro: no FND  Musculoskeletal: No cyanosis, clubbing  Skin: No rashes  Psych: slightly anxious, alert and oriented 3   Data Reviewed:  I have personally reviewed following labs and imaging studies  Micro Results Recent Results (from the past 240 hour(s))  Culture, blood (routine x 2)     Status: None   Collection Time: 11/19/16  8:57 AM  Result Value Ref Range Status   Specimen Description BLOOD RIGHT ARM  Final   Special Requests IN PEDIATRIC BOTTLE Blood Culture adequate volume  Final   Culture NO GROWTH 5 DAYS  Final   Report Status 11/24/2016 FINAL  Final  Culture, blood (routine x 2)     Status: None   Collection Time: 11/19/16  9:05 AM  Result Value Ref Range Status   Specimen Description BLOOD RIGHT ANTECUBITAL  Final   Special Requests IN PEDIATRIC BOTTLE Blood Culture adequate volume  Final   Culture NO GROWTH 5 DAYS  Final   Report Status 11/24/2016 FINAL  Final    Radiology Reports Dg Chest 2 View  Result Date: 11/23/2016 CLINICAL DATA:  Chest tube removal EXAM: CHEST  2 VIEW COMPARISON:  11/21/2016 FINDINGS: Interval removal of right chest tube with residual small right pleural effusion versus pleural thickening. No pneumothorax. Patchy bilateral airspace opacities are again noted, unchanged. Heart is borderline in size. IMPRESSION: Interval removal of right chest tube without pneumothorax. Otherwise no change. Electronically Signed   By: Charlett Nose M.D.   On: 11/23/2016 08:44   Dg Chest 2 View  Result Date: 11/21/2016 CLINICAL DATA:  Chest soreness.  Chest tube . EXAM: CHEST  2 VIEW COMPARISON:  CT 11/20/2016.  Chest x-ray 11/20/2016 FINDINGS:  Chest tube noted on the right. Tiny right anterior hydropneumothorax again noted. No interim change . Stable cardiomegaly. Persistent bilateral pulmonary nodules including cavitary nodules. Reference is made to prior CT report. Small right pleural effusion. No pneumothorax. IMPRESSION: 1. Right chest tube in stable position. Tiny right anterior hydropneumothorax again noted. No interim change. 2. Multiple bilateral pulmonary nodules with cavitation. Reference is made to prior CT report of 11/20/2016 . Electronically Signed   By: Maisie Fus  Register   On: 11/21/2016 11:15   Dg Chest 2 View  Result Date: 11/19/2016 CLINICAL DATA:  Pneumothorax. EXAM: CHEST  2 VIEW COMPARISON:  11/19/2016. FINDINGS: Right chest tube in stable position. No significant pneumothorax noted on today's exam. Small right pleural effusion. Bilateral patchy pulmonary infiltrates with cavitation again noted. Small left pleural effusion noted. Heart size stable. No acute bony abnormality . IMPRESSION: 1. Right chest tube in stable position. No pneumothorax noted on today's exam. Small right pleural effusion. 2. Persistent bilateral cavitary pulmonary infiltrates. Small left pleural effusion. Electronically Signed   By: Maisie Fus  Register   On: 11/19/2016 16:53   Dg Chest 2 View  Result Date: 11/10/2016 CLINICAL DATA:  Chest pain, shortness of breath for 1 week EXAM: CHEST  2 VIEW COMPARISON:  CT chest 03/12/2016, chest x-ray 03/12/2016 FINDINGS: There  are bilateral nodular airspace opacities throughout the upper and lower lobes bilaterally. There is no pleural effusion or pneumothorax. The heart and mediastinal contours are unremarkable. The osseous structures are unremarkable. IMPRESSION: New bilateral nodular airspace opacities throughout the upper and lower lobes bilaterally. Findings are concerning for multifocal infection such as septic emboli. Recommend further evaluation with a CT of the chest with intravenous contrast. Electronically  Signed   By: Elige Ko   On: 11/10/2016 13:23   Ct Chest Wo Contrast  Result Date: 11/20/2016 CLINICAL DATA:  Follow-up right pleural effusion EXAM: CT CHEST WITHOUT CONTRAST TECHNIQUE: Multidetector CT imaging of the chest was performed following the standard protocol without IV contrast. COMPARISON:  Radiograph 11/20/2016, CT chest 11/10/2016, prior radiographs dating back to 11/10/2016 FINDINGS: Cardiovascular: Limited assessment without intravenous contrast. Small fluid in the superior pericardial recess. There is cardiomegaly. Mediastinum/Nodes: Difficult evaluation without contrast. Mediastinal adenopathy is present right paratracheal soft tissue fullness, possible adenopathy does not appear significantly changed. Midline trachea. Esophagus grossly unremarkable. Lungs/Pleura: Interim placement of a right lower lateral chest tube since the prior CT with the tip terminating along the right upper posterior pleural surface. Small bilateral pleural effusions right greater than left, slightly increased compared to the previous CT. Tiny right anterior hydropneumothorax. Multiple bilateral pulmonary nodules and cavitary lesions. Some of the cavitary lesions have decreased, however if there appears to be increased nodules/disease most notable in the left upper lobe and the right lower lobe. Decreased septal thickening compared to the previous exam. Upper Abdomen: Spleen appears enlarged. Partially visualized surgical clips in the gallbladder fossa. Musculoskeletal: No acute or suspicious bone lesion. IMPRESSION: 1. Interim placement of right-sided chest tube. Small residual pleural effusions right greater than left, mildly increased compared to the prior chest CT. Small right anterior hydropneumothorax. 2. Innumerable bilateral lung nodules and cavitary lesions, suspected to represent septic emboli. This appears increased in the left upper and right lower lobe since the prior CT. 3. Suspect mediastinal adenopathy  although evaluation of the mediastinum is limited without contrast 4. Splenomegaly Electronically Signed   By: Jasmine Pang M.D.   On: 11/20/2016 21:25   Ct Chest Wo Contrast  Result Date: 11/10/2016 CLINICAL DATA:  She c/o some chest discomfrot and shortness of breath x 1 week. She states these are similar s/s she had a year ago, at which time she was dx with "pulmonary abscess". She is in no distress. IV drug user EXAM: CT CHEST WITHOUT CONTRAST TECHNIQUE: Multidetector CT imaging of the chest was performed following the standard protocol without IV contrast. COMPARISON:  Chest CT angiogram dated 03/12/2016. FINDINGS: Cardiovascular: Probable small pericardial effusion and/or pericardial thickening, difficult to characterize without IV contrast. Heart size is within normal limits. Thoracic aorta appears to be normal in caliber. Mediastinum/Nodes: Soft tissue density material within the right upper peritracheal space, also difficult to characterize without IV contrast, presumed lymphadenopathy. Additional mild lymphadenopathy within the anterior mediastinum and aortopulmonary window region. Lungs/Pleura: Numerous cavitary consolidations throughout both lungs, peripherally distributed suggesting septic emboli. More confluent consolidations at each lung base, more likely atelectasis than pneumonia. Small right pleural effusion. Upper Abdomen: Suspected splenomegaly, incompletely imaged. Musculoskeletal: No acute or suspicious osseous finding. IMPRESSION: 1. Numerous cavitary consolidations throughout both lungs, with a peripheral distribution suggesting septic emboli. Largest cavitary consolidation is within the left lower lobe measuring approximately 4 cm greatest dimension. 2. Probable small pericardial effusion and/or pericardial wall thickening, difficult to delineate without IV contrast. 3. Probable mediastinal lymphadenopathy, again difficult to  definitively characterize without IV contrast, alternatively  confluent fluid or edema within the mediastinum. 4. Small right pleural effusion. 5. Probable splenomegaly, incompletely imaged at the lower aspects of this chest CT. 6. Right IJ line in place with tip passing through the right atrium and into the right ventricle. Recommend retracting to the cavoatrial junction for optimal radiographic positioning. These results and recommendations were called by telephone at the time of interpretation on 11/10/2016 at 7:35 pm to Dr. Katrinka Blazing , who verbally acknowledged these results. Electronically Signed   By: Bary Richard M.D.   On: 11/10/2016 19:36   Ct Abdomen Pelvis W Contrast  Result Date: 11/14/2016 CLINICAL DATA:  Right upper quadrant pain times 2-3 days. Polysubstance abuse. Cholecystectomy. History of endocarditis and septic emboli. EXAM: CT ABDOMEN AND PELVIS WITH CONTRAST TECHNIQUE: Multidetector CT imaging of the abdomen and pelvis was performed using the standard protocol following bolus administration of intravenous contrast. CONTRAST:  80mL ISOVUE-300 IOPAMIDOL (ISOVUE-300) INJECTION 61% COMPARISON:  03/12/2016 chest CT FINDINGS: Lower chest: Small right effusion with several bilateral cavitary opacities in both lower lobes concerning for septic emboli or cavitary pneumonia. More confluent airspace opacity in the left lower lobe is also noted. The visualized heart is normal in size. There is no pericardial effusion. No large central pulmonary embolus. Hepatobiliary: Normal appearance of the liver without space-occupying mass or biliary dilatation. Cholecystectomy. Pancreas: No pancreatic mass or ductal dilatation. Mild enlargement of the pancreatic head with surrounding peripancreatic fluid is noted. Correlate to exclude a pancreatitis. Spleen: The spleen is enlarged measuring 15.7 x 5.6 x 17 cm (volume = 780 cm^3) and demonstrates a developmental cleft, partially visualized on prior chest CT and therefore believed less likely to represent a laceration.  Adrenals/Urinary Tract: Adrenal glands are unremarkable. Kidneys are normal, without renal calculi, solid appearing lesions, or hydronephrosis. There appear to be pair of water attenuating cysts in the lower pole the right kidney measuring up to 2 x 1.3 cm in aggregate. Bladder is unremarkable. Stomach/Bowel: Stomach is within normal limits. Appendix appears normal. No evidence of bowel wall thickening, distention, or inflammatory changes. Vascular/Lymphatic: No significant vascular findings are present. No enlarged abdominal or pelvic lymph nodes. Reproductive: Uterus and bilateral adnexa are unremarkable. Other: Moderate volume of ascites.  Anasarca. Musculoskeletal: No acute or significant osseous findings. IMPRESSION: 1. Bibasilar, multifocal cavitary lesions noted within the visualized lower lobes consistent with septic emboli or possibly cavitary pneumonia. More confluent airspace disease in the left lower lobe without cavitation is also present. There is a small right pleural effusion. 2. Anasarca with moderate volume of ascites noted within the abdomen and pelvis. Question right heart dysfunction/failure. 3. Splenomegaly. 4. Water attenuating cysts in the lower pole the right kidney in aggregate measuring 2 x 1.3 cm. 5. Status post cholecystectomy. Electronically Signed   By: Tollie Eth M.D.   On: 11/14/2016 16:26   Dg Chest 1v Repeat Same Day  Result Date: 11/18/2016 CLINICAL DATA:  Chest tube placement EXAM: CHEST - 1 VIEW SAME DAY COMPARISON:  Earlier same day FINDINGS: Large bore chest tube placed on the right along the lateral margin. Marked reduction an amount of right pleural air, nearly completely evacuated. Patchy infiltrates persist in both lower lobes. IMPRESSION: Large bore chest tube placed. Near complete resolution of right pneumothorax. Electronically Signed   By: Paulina Fusi M.D.   On: 11/18/2016 11:40   Dg Chest Port 1 View  Result Date: 11/20/2016 CLINICAL DATA:  26 year old female  with pneumothorax. Recent transesophageal  echocardiogram. Subsequent encounter. EXAM: PORTABLE CHEST 1 VIEW COMPARISON:  11/20/2016 8:30 a.m. FINDINGS: Right-sided chest tube remains in place. No pneumothorax detected on this frontal projection. Cavitary lesions bilaterally. Left base consolidation. Atelectasis/pleural thickening right lung base. Cardiomegaly. IMPRESSION: No significant change since exam earlier today. Electronically Signed   By: Lacy Duverney M.D.   On: 11/20/2016 13:55   Dg Chest Port 1 View  Result Date: 11/20/2016 CLINICAL DATA:  Chest tube, shortness of Breath EXAM: PORTABLE CHEST 1 VIEW COMPARISON:  11/19/2016 FINDINGS: Right chest tube remains in place, unchanged. No pneumothorax. Patchy bilateral airspace opacities are again noted, some with cavitation. Small right pleural effusion. Heart is borderline in size. IMPRESSION: Right chest tube remains in place without pneumothorax. Stable patchy bilateral airspace disease, some of which is cavitary. Electronically Signed   By: Charlett Nose M.D.   On: 11/20/2016 08:42   Dg Chest Port 1 View  Result Date: 11/19/2016 CLINICAL DATA:  26 year old female with cavitary lung lesions possibly from septic endocarditis. Subsequent encounter. EXAM: PORTABLE CHEST 1 VIEW COMPARISON:  11/18/2016. FINDINGS: Right-sided chest tube remains in place. Tiny hydropneumothorax versus fluid within minor fissure noted. Multiple cavitary lesions. Right-sided pleural effusion. Basilar atelectasis versus infiltrate greater on the left. Cardiomegaly. Mild scoliosis. IMPRESSION: Right-sided chest tube remains in place. Tiny hydropneumothorax versus fluid within minor fissure. Multiple cavitary lesions. Right-sided pleural effusion. Basilar atelectasis versus infiltrate greater on the left. Electronically Signed   By: Lacy Duverney M.D.   On: 11/19/2016 07:16   Dg Chest Port 1 View  Result Date: 11/18/2016 CLINICAL DATA:  Followup left pneumothorax. Patient with  bacterial endocarditis. EXAM: PORTABLE CHEST 1 VIEW COMPARISON:  11/17/2016 and prior studies FINDINGS: A moderate right hydropneumothorax is unchanged in size, but now with pleural fluid in its basilar portion. A right thoracostomy tube is unchanged. Scattered pulmonary opacities/ nodules and left lower lung consolidations/atelectasis again noted. There has been no other interval change. IMPRESSION: Moderate right hydropneumothorax, unchanged in size but now with pleural fluid in its basilar portion. Right thoracostomy tube remains unchanged. Electronically Signed   By: Harmon Pier M.D.   On: 11/18/2016 10:14   Dg Chest Port 1 View  Result Date: 11/17/2016 CLINICAL DATA:  Follow-up right-sided chest tube placement. Initial encounter. EXAM: PORTABLE CHEST 1 VIEW COMPARISON:  Chest radiograph performed 11/16/2016 FINDINGS: The patient's moderate right-sided pneumothorax has decreased in size. The right-sided chest tube is grossly unchanged in appearance. Vascular congestion is noted. Patchy left-sided airspace opacities raise concern for pneumonia. Right-sided airspace opacity may reflect atelectasis. A small left pleural effusion is noted. The cardiomediastinal silhouette is mildly enlarged. No acute osseous abnormalities are identified. IMPRESSION: 1. Moderate right-sided pneumothorax has decreased in size. Right-sided chest tube is grossly unchanged in appearance. 2. Vascular congestion and mild cardiomegaly. Patchy left-sided airspace opacities raise concern for pneumonia. Small left pleural effusion noted. 3. Right-sided airspace opacity may reflect atelectasis. Electronically Signed   By: Roanna Raider M.D.   On: 11/17/2016 00:33   Dg Chest Port 1 View  Result Date: 11/16/2016 CLINICAL DATA:  Chest tube placement for right pneumothorax. EXAM: PORTABLE CHEST 1 VIEW COMPARISON:  Single-view of the chest earlier today and 11/11/2016. FINDINGS: Pigtail catheter is now in place in the right chest. Large right  pneumothorax is unchanged. Patchy airspace disease in cavitary lesions in the left chest persist. Heart size is normal. IMPRESSION: No change in large right pneumothorax after chest tube placement. No change in patchy airspace disease in cavitary lesions  on the left. These results were called by telephone at the time of interpretation on 11/16/2016 at 8:10 pm to Estrella Deeds, RN, who verbally acknowledged these results. Electronically Signed   By: Drusilla Kanner M.D.   On: 11/16/2016 20:12   Dg Chest Port 1 View  Result Date: 11/16/2016 CLINICAL DATA:  Shortness of Breath EXAM: PORTABLE CHEST 1 VIEW COMPARISON:  11/11/2016 FINDINGS: Cardiac shadow is within normal limits. Cavitary nodular densities are noted throughout both lungs similar to that seen on previous CT examination. A new right-sided moderately large pneumothorax is noted with consolidation of the lower lobe on the right. No acute bony abnormality is noted. IMPRESSION: Moderately large right-sided pneumothorax new from the prior exam. Near complete collapse of the right lower lobe is noted. Stable cavitary lesions within both lungs. Critical Value/emergent results were called by telephone at the time of interpretation on 11/16/2016 at 6:24 pm to Dr Leitha Bleak, who verbally acknowledged these results. Electronically Signed   By: Alcide Clever M.D.   On: 11/16/2016 18:27   Dg Chest Port 1 View  Result Date: 11/11/2016 CLINICAL DATA:  Check right IJ line placement.  Initial encounter. EXAM: PORTABLE CHEST 1 VIEW COMPARISON:  Chest radiograph performed 11/10/2016 FINDINGS: The right IJ line is noted ending at the right atrium. This could be retracted 2-3 cm. Mild vascular congestion is noted. Mild left-sided opacities may reflect atelectasis or mild pneumonia. No pleural effusion or pneumothorax is seen. The cardiomediastinal silhouette is borderline normal in size. No acute osseous abnormalities are identified. IMPRESSION: 1. Right IJ line noted ending at  the right atrium. This could be retracted 2-3 cm, as deemed clinically appropriate. 2. Mild vascular congestion noted. 3. Mild left-sided airspace opacities may reflect atelectasis or mild pneumonia. Electronically Signed   By: Roanna Raider M.D.   On: 11/11/2016 00:37   Dg Chest Port 1 View  Result Date: 11/10/2016 CLINICAL DATA:  Retraction of the IJ line about 10 cm EXAM: PORTABLE CHEST 1 VIEW COMPARISON:  CT chest 11/10/2016.  Chest 11/10/2016. FINDINGS: The left central venous catheter tip localizes to the mid/distal right atrial region. If placement at or above the cavoatrial junction is desired, retraction of about 3-4 cm is recommended. No pneumothorax. Shallow inspiration with atelectasis in the lung bases. Borderline heart size. Patchy airspace infiltrates in both lungs could indicate edema, aspiration, or pneumonia. No blunting of costophrenic angles. IMPRESSION: Central venous catheter tip is in the mid/ low right atrial region. If placement at or above the cavoatrial junction is desired, retraction of about 3-4 cm is recommended. Electronically Signed   By: Burman Nieves M.D.   On: 11/10/2016 21:35   Dg Chest Portable 1 View  Result Date: 11/10/2016 CLINICAL DATA:  Status post central line placement. EXAM: PORTABLE CHEST 1 VIEW COMPARISON:  Chest x-ray from earlier same day. FINDINGS: Interval placement of a right IJ central line with tip passing to the left of midline and likely in the right ventricle or main pulmonary artery. The bilateral small nodular airspace opacities throughout both lungs are unchanged in the short-term interval. Suspect small bilateral pleural effusions. No pneumothorax seen. IMPRESSION: 1. Right IJ central line placement with tip positioned to the left of midline either within the right ventricle or main pulmonary artery. Recommend retracting at least 10 cm. 2. Bilateral small nodular airspace opacities throughout both lungs are unchanged in the short-term interval.  As recommended on earlier chest x-ray report, would consider chest CT for further characterization. 3. Suspect  small bilateral pleural effusions. Electronically Signed   By: Bary Richard M.D.   On: 11/10/2016 18:37    Lab Data:  CBC:  Recent Labs Lab 11/22/16 0231 11/24/16 0606 11/25/16 0445 11/26/16 0612 11/28/16 0405  WBC 7.4 5.4 5.6 5.5 6.8  HGB 8.5* 7.5* 7.1* 9.5* 9.7*  HCT 26.3* 23.8* 22.7* 29.2* 30.4*  MCV 85.7 86.5 88.3 87.7 86.6  PLT 407* 360 332 326 329   Basic Metabolic Panel:  Recent Labs Lab 11/24/16 0606 11/25/16 0445 11/26/16 0612 11/27/16 0400 11/28/16 0405  NA 139 138 139 138 138  K 3.4* 3.8 3.6 3.3* 3.5  CL 107 107 107 104 105  CO2 24 26 24 27 26   GLUCOSE 89 97 86 94 98  BUN 7 10 10 6 8   CREATININE 0.79 0.80 0.92  0.95 0.79 0.78  CALCIUM 7.9* 8.3* 8.3* 8.4* 8.0*   GFR: Estimated Creatinine Clearance: 95.6 mL/min (by C-G formula based on SCr of 0.78 mg/dL). Liver Function Tests: No results for input(s): AST, ALT, ALKPHOS, BILITOT, PROT, ALBUMIN in the last 168 hours. No results for input(s): LIPASE, AMYLASE in the last 168 hours. No results for input(s): AMMONIA in the last 168 hours. Coagulation Profile: No results for input(s): INR, PROTIME in the last 168 hours. Cardiac Enzymes: No results for input(s): CKTOTAL, CKMB, CKMBINDEX, TROPONINI in the last 168 hours. BNP (last 3 results) No results for input(s): PROBNP in the last 8760 hours. HbA1C: No results for input(s): HGBA1C in the last 72 hours. CBG: No results for input(s): GLUCAP in the last 168 hours. Lipid Profile: No results for input(s): CHOL, HDL, LDLCALC, TRIG, CHOLHDL, LDLDIRECT in the last 72 hours. Thyroid Function Tests: No results for input(s): TSH, T4TOTAL, FREET4, T3FREE, THYROIDAB in the last 72 hours. Anemia Panel: No results for input(s): VITAMINB12, FOLATE, FERRITIN, TIBC, IRON, RETICCTPCT in the last 72 hours. Urine analysis:    Component Value Date/Time    COLORURINE YELLOW 11/10/2016 2132   APPEARANCEUR CLEAR 11/10/2016 2132   LABSPEC 1.006 11/10/2016 2132   PHURINE 5.0 11/10/2016 2132   GLUCOSEU NEGATIVE 11/10/2016 2132   HGBUR MODERATE (A) 11/10/2016 2132   BILIRUBINUR NEGATIVE 11/10/2016 2132   KETONESUR NEGATIVE 11/10/2016 2132   PROTEINUR NEGATIVE 11/10/2016 2132   UROBILINOGEN 0.2 08/05/2010 1821   NITRITE NEGATIVE 11/10/2016 2132   LEUKOCYTESUR SMALL (A) 11/10/2016 2132     Thedford Bunton M.D. [facility] 11/28/2016, 11:42 AM  Pager: [phone] Between 7am to 7pm - call Pager - [phone]  After 7pm go to [email or web address] - password [email or web address]  Call night coverage person covering after 7pm            [facility]                                                                              Patient Demographics  Savannah Palmer, is a 26 y.o. female, DOB - 1991/03/23, YNW:[id number]  Admit date - 11/10/2016   Admitting Physician Coralyn Helling, MD  Outpatient Primary MD for the patient is Patient, No Pcp Per  Outpatient specialists:   LOS - 18  days   Medical records reviewed and are as summarized below:    No chief complaint on  file.      Brief summary    26 year old Caucasian female with a past medical history of anxiety, depression, heroin abuse, presented with weakness, cough and chest pain. Initially was in septic shock and required pressors. Blood cultures grew MRSA. 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 She was placed on IV antibiotics. She also developed a spontaneous right-sided pneumothorax. Chest tube was placed by critical care medicine. She was transferred to [facility] for opinion from cardiothoracic surgery. Patient is not a candidate for surgery at this time.   Assessment & Plan    Principal Problem: MRSA bacteremia/Septic shock with right TV endocarditis and septic emboli to lungs/MRSA empyema - Admitted  with septic shock to ICU requiring vasopressors -Blood cultures 5/27 grew MRSA and found to have septic pulmonary emboli -Repeat cultures subsequently positive but have been negative since 6/4  -2-D echo on 5/27 with tricuspid valve vegetations, moderate TR, TEE on 6/1 showed large tricuspid valve vegetation with severe TR and dilated RV  - Due to persistent bacteremia patient was started on daptomycin 6/2 and Ceftaroline continued to cover lung infection. - cardiothoracic surgery was also consulted, not a candidate for surgery at this time.  - Per ID recommendations, stopped Ceftaroline and daptomycin, use vancomycin alone, duration through July 15, will need BMP, CBC weekly, trough goal of 15-20.  -ID follow-up  -Child psychotherapist working on SNF placement  Acute respiratory failure/spontaneous right pneumothorax/MRSA empyema -Followed by CVTS, status post chest tube drainage, improved -Chest tube removed 6/7 -Stable now and repeat chest x-rays clear  Acute kidney injury -Secondary to NSAIDs and dehydration -Resolved  Ascites, anasarca due to right sided hear failure - as a result of severe TR, net positive balance of 13.7 L - limit IVF, hold off on administering diuretics in setting of sepsis and borderline BPs, O2 sats 96% on room air  Splenomegaly - etiology undetermined- CT on 5/30 revealed that the spleen is enlarged measuring 15.7 x 5.6 x 17 cm (volume= 780 cm^3) - liver is unremarkable on CT -Needs follow-up   Normocytic Anemia - H&H currently stable, was transfused on 6/5, no overt bleeding noted - Iron was 12. Ferritin 213. B-12 1850. Folate 7.8.  Hypokalemia/Hypophosphatemia/Hypomagnesemia - Resolved  Thrombocytopenia - Resolved  Heroin abuse - cont Suboxone- will need transition to a drug rehab program once stable.   Anxiety - cont Buspar, Neurontin, Xanax  Nicotine dependence -Continue  Nicoderm patch   Code Status: Full CODE STATUS  DVT Prophylaxis:   heparin  Family Communication: Mother at bedside Disposition Plan: SNF that will accept patient under current circumstances versus prolonged inpatient stay until completion of Abx  Time Spent in minutes  25 minutes  Procedures:  TEE  - Left ventricle: The cavity size was normal. Wall thickness was normal. Systolic function was normal. The estimated ejection fraction was in the range of 55% to 60%. Wall motion was normal; there were no regional wall motion abnormalities. - Left atrium: No evidence of thrombus in the atrial cavity or appendage. - Right ventricle: The cavity size was mildly dilated. Wall thickness was normal. - Right atrium: No evidence of thrombus in the atrial cavity or appendage. - Tricuspid valve: Flail motion of the posterior leaflet. There is a 8x5 mm vegetation attached to the posterior leaflet, but most of the &quot;mass&quot; described on transthoracic imaging appears to be the flail segment of the valve. There was severe regurgitation  directed eccentrically and toward the free wall.  Right chest tube placement for pneumothorax 6/1  Placement of a larger bore chest tube in the right chest 6/3  Consultants:   Infectious disease Cardiothoracic surgery  Antimicrobials:   Daptomycin 6/2> 6/8  Teflaro 5/31> 6/8  Vancomycin 6/8>> will need to July 15   Medications  Scheduled Meds: . acetaminophen  1,000 mg Oral Q6H  . buprenorphine-naloxone  2 tablet Sublingual Daily  . busPIRone  10 mg Oral BID  . gabapentin  300 mg Oral TID  . heparin  5,000 Units Subcutaneous Q8H  . nicotine  14 mg Transdermal Daily  . saccharomyces boulardii  250 mg Oral BID   Continuous Infusions: . sodium chloride 250 mL (11/19/16 0700)  . vancomycin 1,000 mg (11/28/16 0917)   PRN Meds:.sodium chloride, albuterol, ALPRAZolam, docusate sodium, ibuprofen, nicotine polacrilex, ondansetron (ZOFRAN) IV, oxyCODONE, sodium chloride flush   Antibiotics     Anti-infectives    Start     Dose/Rate Route Frequency Ordered Stop   11/26/16 2000  vancomycin (VANCOCIN) IVPB 1000 mg/200 mL premix     1,000 mg 200 mL/hr over 60 Minutes Intravenous Every 12 hours 11/26/16 1912     11/24/16 0000  vancomycin (VANCOCIN) IVPB 1000 mg/200 mL premix  Status:  Discontinued     1,000 mg 200 mL/hr over 60 Minutes Intravenous Every 8 hours 11/23/16 1238 11/26/16 0721   11/23/16 1600  vancomycin (VANCOCIN) 1,500 mg in sodium chloride 0.9 % 500 mL IVPB     1,500 mg 250 mL/hr over 120 Minutes Intravenous  Once 11/23/16 1238 11/23/16 1733   11/17/16 1600  DAPTOmycin (CUBICIN) 500 mg in sodium chloride 0.9 % IVPB  Status:  Discontinued     500 mg 220 mL/hr over 30 Minutes Intravenous Every 24 hours 11/17/16 1432 11/23/16 1238   11/17/16 1500  ceftaroline (TEFLARO) 600 mg in sodium chloride 0.9 % 250 mL IVPB  Status:  Discontinued     600 mg 250 mL/hr over 60 Minutes Intravenous Every 12 hours 11/17/16 1435 11/23/16 1238   11/15/16 1800  ceftaroline (TEFLARO) 600 mg in sodium chloride 0.9 % 250 mL IVPB  Status:  Discontinued     600 mg 250 mL/hr over 60 Minutes Intravenous Every 12 hours 11/15/16 1635 11/17/16 1431   11/13/16 1215  vancomycin (VANCOCIN) IVPB 1000 mg/200 mL premix  Status:  Discontinued     1,000 mg 200 mL/hr over 60 Minutes Intravenous Every 8 hours 11/13/16 1203 11/16/16 0850   11/11/16 1100  vancomycin (VANCOCIN) IVPB 750 mg/150 ml premix  Status:  Discontinued     750 mg 150 mL/hr over 60 Minutes Intravenous Every 8 hours 11/11/16 0956 11/13/16 1203   11/11/16 0400  vancomycin (VANCOCIN) 500 mg in sodium chloride 0.9 % 100 mL IVPB  Status:  Discontinued     500 mg 100 mL/hr over 60 Minutes Intravenous Every 12 hours 11/10/16 1951 11/11/16 0956   11/10/16 2200  ceFEPIme (MAXIPIME) 1 g in dextrose 5 % 50 mL IVPB  Status:  Discontinued     1 g 100 mL/hr over 30 Minutes Intravenous Every 12 hours 11/10/16 1951 11/11/16 0926   11/10/16 2000   ceFEPIme (MAXIPIME) 2 g in dextrose 5 % 50 mL IVPB  Status:  Discontinued     2 g 100 mL/hr over 30 Minutes Intravenous  Once 11/10/16 1952 11/10/16 1954   11/10/16 2000  vancomycin (VANCOCIN) IVPB 1000 mg/200 mL premix  Status:  Discontinued  1,000 mg 200 mL/hr over 60 Minutes Intravenous  Once 11/10/16 1952 11/10/16 1954   11/10/16 1430  piperacillin-tazobactam (ZOSYN) IVPB 3.375 g     3.375 g 100 mL/hr over 30 Minutes Intravenous  Once 11/10/16 1426 11/10/16 1503   11/10/16 1430  vancomycin (VANCOCIN) IVPB 1000 mg/200 mL premix     1,000 mg 200 mL/hr over 60 Minutes Intravenous  Once 11/10/16 1426 11/10/16 1653        Subjective:  Patient feels okay, no events overnight, denies any nausea vomiting diarrhea, notes mild lower extremity edema, denies any shortness of breath or chest pain  Objective:   Vitals:   11/27/16 0517 11/27/16 1450 11/27/16 2200 11/28/16 0541  BP:  118/86 (!) 148/93 136/81  Pulse:  (!) 58 63 (!) 57  Resp:  16 18 18   Temp:  98.5 F (36.9 C) 98.9 F (37.2 C) 98.4 F (36.9 C)  TempSrc:      SpO2:  96% 96% 98%  Weight: 64.5 kg (142 lb 1.6 oz)   63.5 kg (140 lb 1.6 oz)  Height:        Intake/Output Summary (Last 24 hours) at 11/28/16 1142 Last data filed at 11/28/16 0410  Gross per 24 hour  Intake              230 ml  Output                1 ml  Net              229 ml     Wt Readings from Last 3 Encounters:  11/28/16 63.5 kg (140 lb 1.6 oz)  03/14/16 58.5 kg (129 lb)  01/19/16 58.1 kg (128 lb)     Exam Awake Alert, Oriented X 3, No new F.N deficits, Normal affectN HEENT:Cross.AT,PERRAL, supple Neck,No JVD,  Lungs:  Good air movement bilaterally, CTAB CVS: RRR, grade 2 systolic murmur Abdomen +ve B.Sounds, Abd Soft, No tenderness, No rebound - guarding or rigidity.  Extremities: Trace edema   Data Reviewed:  I have personally reviewed following labs and imaging studies  Micro Results Recent Results (from the past 240 hour(s))    Culture, blood (routine x 2)     Status: None   Collection Time: 11/19/16  8:57 AM  Result Value Ref Range Status   Specimen Description BLOOD RIGHT ARM  Final   Special Requests IN PEDIATRIC BOTTLE Blood Culture adequate volume  Final   Culture NO GROWTH 5 DAYS  Final   Report Status 11/24/2016 FINAL  Final  Culture, blood (routine x 2)     Status: None   Collection Time: 11/19/16  9:05 AM  Result Value Ref Range Status   Specimen Description BLOOD RIGHT ANTECUBITAL  Final   Special Requests IN PEDIATRIC BOTTLE Blood Culture adequate volume  Final   Culture NO GROWTH 5 DAYS  Final   Report Status 11/24/2016 FINAL  Final    Radiology Reports Dg Chest 2 View  Result Date: 11/23/2016 CLINICAL DATA:  Chest tube removal EXAM: CHEST  2 VIEW COMPARISON:  11/21/2016 FINDINGS: Interval removal of right chest tube with residual small right pleural effusion versus pleural thickening. No pneumothorax. Patchy bilateral airspace opacities are again noted, unchanged. Heart is borderline in size. IMPRESSION: Interval removal of right chest tube without pneumothorax. Otherwise no change. Electronically Signed   By: Charlett Nose M.D.   On: 11/23/2016 08:44   Dg Chest 2 View  Result Date: 11/21/2016 CLINICAL DATA:  Chest  soreness.  Chest tube . EXAM: CHEST  2 VIEW COMPARISON:  CT 11/20/2016.  Chest x-ray 11/20/2016 FINDINGS: Chest tube noted on the right. Tiny right anterior hydropneumothorax again noted. No interim change . Stable cardiomegaly. Persistent bilateral pulmonary nodules including cavitary nodules. Reference is made to prior CT report. Small right pleural effusion. No pneumothorax. IMPRESSION: 1. Right chest tube in stable position. Tiny right anterior hydropneumothorax again noted. No interim change. 2. Multiple bilateral pulmonary nodules with cavitation. Reference is made to prior CT report of 11/20/2016 . Electronically Signed   By: Maisie Fus  Register   On: 11/21/2016 11:15   Dg Chest 2  View  Result Date: 11/19/2016 CLINICAL DATA:  Pneumothorax. EXAM: CHEST  2 VIEW COMPARISON:  11/19/2016. FINDINGS: Right chest tube in stable position. No significant pneumothorax noted on today's exam. Small right pleural effusion. Bilateral patchy pulmonary infiltrates with cavitation again noted. Small left pleural effusion noted. Heart size stable. No acute bony abnormality . IMPRESSION: 1. Right chest tube in stable position. No pneumothorax noted on today's exam. Small right pleural effusion. 2. Persistent bilateral cavitary pulmonary infiltrates. Small left pleural effusion. Electronically Signed   By: Maisie Fus  Register   On: 11/19/2016 16:53   Dg Chest 2 View  Result Date: 11/10/2016 CLINICAL DATA:  Chest pain, shortness of breath for 1 week EXAM: CHEST  2 VIEW COMPARISON:  CT chest 03/12/2016, chest x-ray 03/12/2016 FINDINGS: There are bilateral nodular airspace opacities throughout the upper and lower lobes bilaterally. There is no pleural effusion or pneumothorax. The heart and mediastinal contours are unremarkable. The osseous structures are unremarkable. IMPRESSION: New bilateral nodular airspace opacities throughout the upper and lower lobes bilaterally. Findings are concerning for multifocal infection such as septic emboli. Recommend further evaluation with a CT of the chest with intravenous contrast. Electronically Signed   By: Elige Ko   On: 11/10/2016 13:23   Ct Chest Wo Contrast  Result Date: 11/20/2016 CLINICAL DATA:  Follow-up right pleural effusion EXAM: CT CHEST WITHOUT CONTRAST TECHNIQUE: Multidetector CT imaging of the chest was performed following the standard protocol without IV contrast. COMPARISON:  Radiograph 11/20/2016, CT chest 11/10/2016, prior radiographs dating back to 11/10/2016 FINDINGS: Cardiovascular: Limited assessment without intravenous contrast. Small fluid in the superior pericardial recess. There is cardiomegaly. Mediastinum/Nodes: Difficult evaluation without  contrast. Mediastinal adenopathy is present right paratracheal soft tissue fullness, possible adenopathy does not appear significantly changed. Midline trachea. Esophagus grossly unremarkable. Lungs/Pleura: Interim placement of a right lower lateral chest tube since the prior CT with the tip terminating along the right upper posterior pleural surface. Small bilateral pleural effusions right greater than left, slightly increased compared to the previous CT. Tiny right anterior hydropneumothorax. Multiple bilateral pulmonary nodules and cavitary lesions. Some of the cavitary lesions have decreased, however if there appears to be increased nodules/disease most notable in the left upper lobe and the right lower lobe. Decreased septal thickening compared to the previous exam. Upper Abdomen: Spleen appears enlarged. Partially visualized surgical clips in the gallbladder fossa. Musculoskeletal: No acute or suspicious bone lesion. IMPRESSION: 1. Interim placement of right-sided chest tube. Small residual pleural effusions right greater than left, mildly increased compared to the prior chest CT. Small right anterior hydropneumothorax. 2. Innumerable bilateral lung nodules and cavitary lesions, suspected to represent septic emboli. This appears increased in the left upper and right lower lobe since the prior CT. 3. Suspect mediastinal adenopathy although evaluation of the mediastinum is limited without contrast 4. Splenomegaly Electronically Signed   By: Selena Batten  Jake Samples M.D.   On: 11/20/2016 21:25   Ct Chest Wo Contrast  Result Date: 11/10/2016 CLINICAL DATA:  She c/o some chest discomfrot and shortness of breath x 1 week. She states these are similar s/s she had a year ago, at which time she was dx with "pulmonary abscess". She is in no distress. IV drug user EXAM: CT CHEST WITHOUT CONTRAST TECHNIQUE: Multidetector CT imaging of the chest was performed following the standard protocol without IV contrast. COMPARISON:  Chest  CT angiogram dated 03/12/2016. FINDINGS: Cardiovascular: Probable small pericardial effusion and/or pericardial thickening, difficult to characterize without IV contrast. Heart size is within normal limits. Thoracic aorta appears to be normal in caliber. Mediastinum/Nodes: Soft tissue density material within the right upper peritracheal space, also difficult to characterize without IV contrast, presumed lymphadenopathy. Additional mild lymphadenopathy within the anterior mediastinum and aortopulmonary window region. Lungs/Pleura: Numerous cavitary consolidations throughout both lungs, peripherally distributed suggesting septic emboli. More confluent consolidations at each lung base, more likely atelectasis than pneumonia. Small right pleural effusion. Upper Abdomen: Suspected splenomegaly, incompletely imaged. Musculoskeletal: No acute or suspicious osseous finding. IMPRESSION: 1. Numerous cavitary consolidations throughout both lungs, with a peripheral distribution suggesting septic emboli. Largest cavitary consolidation is within the left lower lobe measuring approximately 4 cm greatest dimension. 2. Probable small pericardial effusion and/or pericardial wall thickening, difficult to delineate without IV contrast. 3. Probable mediastinal lymphadenopathy, again difficult to definitively characterize without IV contrast, alternatively confluent fluid or edema within the mediastinum. 4. Small right pleural effusion. 5. Probable splenomegaly, incompletely imaged at the lower aspects of this chest CT. 6. Right IJ line in place with tip passing through the right atrium and into the right ventricle. Recommend retracting to the cavoatrial junction for optimal radiographic positioning. These results and recommendations were called by telephone at the time of interpretation on 11/10/2016 at 7:35 pm to Dr. Katrinka Blazing , who verbally acknowledged these results. Electronically Signed   By: Bary Richard M.D.   On: 11/10/2016 19:36    Ct Abdomen Pelvis W Contrast  Result Date: 11/14/2016 CLINICAL DATA:  Right upper quadrant pain times 2-3 days. Polysubstance abuse. Cholecystectomy. History of endocarditis and septic emboli. EXAM: CT ABDOMEN AND PELVIS WITH CONTRAST TECHNIQUE: Multidetector CT imaging of the abdomen and pelvis was performed using the standard protocol following bolus administration of intravenous contrast. CONTRAST:  80mL ISOVUE-300 IOPAMIDOL (ISOVUE-300) INJECTION 61% COMPARISON:  03/12/2016 chest CT FINDINGS: Lower chest: Small right effusion with several bilateral cavitary opacities in both lower lobes concerning for septic emboli or cavitary pneumonia. More confluent airspace opacity in the left lower lobe is also noted. The visualized heart is normal in size. There is no pericardial effusion. No large central pulmonary embolus. Hepatobiliary: Normal appearance of the liver without space-occupying mass or biliary dilatation. Cholecystectomy. Pancreas: No pancreatic mass or ductal dilatation. Mild enlargement of the pancreatic head with surrounding peripancreatic fluid is noted. Correlate to exclude a pancreatitis. Spleen: The spleen is enlarged measuring 15.7 x 5.6 x 17 cm (volume = 780 cm^3) and demonstrates a developmental cleft, partially visualized on prior chest CT and therefore believed less likely to represent a laceration. Adrenals/Urinary Tract: Adrenal glands are unremarkable. Kidneys are normal, without renal calculi, solid appearing lesions, or hydronephrosis. There appear to be pair of water attenuating cysts in the lower pole the right kidney measuring up to 2 x 1.3 cm in aggregate. Bladder is unremarkable. Stomach/Bowel: Stomach is within normal limits. Appendix appears normal. No evidence of bowel wall thickening, distention, or  inflammatory changes. Vascular/Lymphatic: No significant vascular findings are present. No enlarged abdominal or pelvic lymph nodes. Reproductive: Uterus and bilateral adnexa are  unremarkable. Other: Moderate volume of ascites.  Anasarca. Musculoskeletal: No acute or significant osseous findings. IMPRESSION: 1. Bibasilar, multifocal cavitary lesions noted within the visualized lower lobes consistent with septic emboli or possibly cavitary pneumonia. More confluent airspace disease in the left lower lobe without cavitation is also present. There is a small right pleural effusion. 2. Anasarca with moderate volume of ascites noted within the abdomen and pelvis. Question right heart dysfunction/failure. 3. Splenomegaly. 4. Water attenuating cysts in the lower pole the right kidney in aggregate measuring 2 x 1.3 cm. 5. Status post cholecystectomy. Electronically Signed   By: Tollie Ethavid  Kwon M.D.   On: 11/14/2016 16:26   Dg Chest 1v Repeat Same Day  Result Date: 11/18/2016 CLINICAL DATA:  Chest tube placement EXAM: CHEST - 1 VIEW SAME DAY COMPARISON:  Earlier same day FINDINGS: Large bore chest tube placed on the right along the lateral margin. Marked reduction an amount of right pleural air, nearly completely evacuated. Patchy infiltrates persist in both lower lobes. IMPRESSION: Large bore chest tube placed. Near complete resolution of right pneumothorax. Electronically Signed   By: Paulina FusiMark  Shogry M.D.   On: 11/18/2016 11:40   Dg Chest Port 1 View  Result Date: 11/20/2016 CLINICAL DATA:  26 year old female with pneumothorax. Recent transesophageal echocardiogram. Subsequent encounter. EXAM: PORTABLE CHEST 1 VIEW COMPARISON:  11/20/2016 8:30 a.m. FINDINGS: Right-sided chest tube remains in place. No pneumothorax detected on this frontal projection. Cavitary lesions bilaterally. Left base consolidation. Atelectasis/pleural thickening right lung base. Cardiomegaly. IMPRESSION: No significant change since exam earlier today. Electronically Signed   By: Lacy DuverneySteven  Olson M.D.   On: 11/20/2016 13:55   Dg Chest Port 1 View  Result Date: 11/20/2016 CLINICAL DATA:  Chest tube, shortness of Breath EXAM:  PORTABLE CHEST 1 VIEW COMPARISON:  11/19/2016 FINDINGS: Right chest tube remains in place, unchanged. No pneumothorax. Patchy bilateral airspace opacities are again noted, some with cavitation. Small right pleural effusion. Heart is borderline in size. IMPRESSION: Right chest tube remains in place without pneumothorax. Stable patchy bilateral airspace disease, some of which is cavitary. Electronically Signed   By: Charlett NoseKevin  Dover M.D.   On: 11/20/2016 08:42   Dg Chest Port 1 View  Result Date: 11/19/2016 CLINICAL DATA:  26 year old female with cavitary lung lesions possibly from septic endocarditis. Subsequent encounter. EXAM: PORTABLE CHEST 1 VIEW COMPARISON:  11/18/2016. FINDINGS: Right-sided chest tube remains in place. Tiny hydropneumothorax versus fluid within minor fissure noted. Multiple cavitary lesions. Right-sided pleural effusion. Basilar atelectasis versus infiltrate greater on the left. Cardiomegaly. Mild scoliosis. IMPRESSION: Right-sided chest tube remains in place. Tiny hydropneumothorax versus fluid within minor fissure. Multiple cavitary lesions. Right-sided pleural effusion. Basilar atelectasis versus infiltrate greater on the left. Electronically Signed   By: Lacy DuverneySteven  Olson M.D.   On: 11/19/2016 07:16   Dg Chest Port 1 View  Result Date: 11/18/2016 CLINICAL DATA:  Followup left pneumothorax. Patient with bacterial endocarditis. EXAM: PORTABLE CHEST 1 VIEW COMPARISON:  11/17/2016 and prior studies FINDINGS: A moderate right hydropneumothorax is unchanged in size, but now with pleural fluid in its basilar portion. A right thoracostomy tube is unchanged. Scattered pulmonary opacities/ nodules and left lower lung consolidations/atelectasis again noted. There has been no other interval change. IMPRESSION: Moderate right hydropneumothorax, unchanged in size but now with pleural fluid in its basilar portion. Right thoracostomy tube remains unchanged. Electronically Signed   By:  Harmon Pier M.D.   On:  11/18/2016 10:14   Dg Chest Port 1 View  Result Date: 11/17/2016 CLINICAL DATA:  Follow-up right-sided chest tube placement. Initial encounter. EXAM: PORTABLE CHEST 1 VIEW COMPARISON:  Chest radiograph performed 11/16/2016 FINDINGS: The patient's moderate right-sided pneumothorax has decreased in size. The right-sided chest tube is grossly unchanged in appearance. Vascular congestion is noted. Patchy left-sided airspace opacities raise concern for pneumonia. Right-sided airspace opacity may reflect atelectasis. A small left pleural effusion is noted. The cardiomediastinal silhouette is mildly enlarged. No acute osseous abnormalities are identified. IMPRESSION: 1. Moderate right-sided pneumothorax has decreased in size. Right-sided chest tube is grossly unchanged in appearance. 2. Vascular congestion and mild cardiomegaly. Patchy left-sided airspace opacities raise concern for pneumonia. Small left pleural effusion noted. 3. Right-sided airspace opacity may reflect atelectasis. Electronically Signed   By: Roanna Raider M.D.   On: 11/17/2016 00:33   Dg Chest Port 1 View  Result Date: 11/16/2016 CLINICAL DATA:  Chest tube placement for right pneumothorax. EXAM: PORTABLE CHEST 1 VIEW COMPARISON:  Single-view of the chest earlier today and 11/11/2016. FINDINGS: Pigtail catheter is now in place in the right chest. Large right pneumothorax is unchanged. Patchy airspace disease in cavitary lesions in the left chest persist. Heart size is normal. IMPRESSION: No change in large right pneumothorax after chest tube placement. No change in patchy airspace disease in cavitary lesions on the left. These results were called by telephone at the time of interpretation on 11/16/2016 at 8:10 pm to Estrella Deeds, RN, who verbally acknowledged these results. Electronically Signed   By: Drusilla Kanner M.D.   On: 11/16/2016 20:12   Dg Chest Port 1 View  Result Date: 11/16/2016 CLINICAL DATA:  Shortness of Breath EXAM: PORTABLE  CHEST 1 VIEW COMPARISON:  11/11/2016 FINDINGS: Cardiac shadow is within normal limits. Cavitary nodular densities are noted throughout both lungs similar to that seen on previous CT examination. A new right-sided moderately large pneumothorax is noted with consolidation of the lower lobe on the right. No acute bony abnormality is noted. IMPRESSION: Moderately large right-sided pneumothorax new from the prior exam. Near complete collapse of the right lower lobe is noted. Stable cavitary lesions within both lungs. Critical Value/emergent results were called by telephone at the time of interpretation on 11/16/2016 at 6:24 pm to Dr Leitha Bleak, who verbally acknowledged these results. Electronically Signed   By: Alcide Clever M.D.   On: 11/16/2016 18:27   Dg Chest Port 1 View  Result Date: 11/11/2016 CLINICAL DATA:  Check right IJ line placement.  Initial encounter. EXAM: PORTABLE CHEST 1 VIEW COMPARISON:  Chest radiograph performed 11/10/2016 FINDINGS: The right IJ line is noted ending at the right atrium. This could be retracted 2-3 cm. Mild vascular congestion is noted. Mild left-sided opacities may reflect atelectasis or mild pneumonia. No pleural effusion or pneumothorax is seen. The cardiomediastinal silhouette is borderline normal in size. No acute osseous abnormalities are identified. IMPRESSION: 1. Right IJ line noted ending at the right atrium. This could be retracted 2-3 cm, as deemed clinically appropriate. 2. Mild vascular congestion noted. 3. Mild left-sided airspace opacities may reflect atelectasis or mild pneumonia. Electronically Signed   By: Roanna Raider M.D.   On: 11/11/2016 00:37   Dg Chest Port 1 View  Result Date: 11/10/2016 CLINICAL DATA:  Retraction of the IJ line about 10 cm EXAM: PORTABLE CHEST 1 VIEW COMPARISON:  CT chest 11/10/2016.  Chest 11/10/2016. FINDINGS: The left central venous catheter tip localizes  to the mid/distal right atrial region. If placement at or above the cavoatrial  junction is desired, retraction of about 3-4 cm is recommended. No pneumothorax. Shallow inspiration with atelectasis in the lung bases. Borderline heart size. Patchy airspace infiltrates in both lungs could indicate edema, aspiration, or pneumonia. No blunting of costophrenic angles. IMPRESSION: Central venous catheter tip is in the mid/ low right atrial region. If placement at or above the cavoatrial junction is desired, retraction of about 3-4 cm is recommended. Electronically Signed   By: Burman Nieves M.D.   On: 11/10/2016 21:35   Dg Chest Portable 1 View  Result Date: 11/10/2016 CLINICAL DATA:  Status post central line placement. EXAM: PORTABLE CHEST 1 VIEW COMPARISON:  Chest x-ray from earlier same day. FINDINGS: Interval placement of a right IJ central line with tip passing to the left of midline and likely in the right ventricle or main pulmonary artery. The bilateral small nodular airspace opacities throughout both lungs are unchanged in the short-term interval. Suspect small bilateral pleural effusions. No pneumothorax seen. IMPRESSION: 1. Right IJ central line placement with tip positioned to the left of midline either within the right ventricle or main pulmonary artery. Recommend retracting at least 10 cm. 2. Bilateral small nodular airspace opacities throughout both lungs are unchanged in the short-term interval. As recommended on earlier chest x-ray report, would consider chest CT for further characterization. 3. Suspect small bilateral pleural effusions. Electronically Signed   By: Bary Richard M.D.   On: 11/10/2016 18:37    Lab Data:  CBC:  Recent Labs Lab 11/22/16 0231 11/24/16 0606 11/25/16 0445 11/26/16 0612 11/28/16 0405  WBC 7.4 5.4 5.6 5.5 6.8  HGB 8.5* 7.5* 7.1* 9.5* 9.7*  HCT 26.3* 23.8* 22.7* 29.2* 30.4*  MCV 85.7 86.5 88.3 87.7 86.6  PLT 407* 360 332 326 329   Basic Metabolic Panel:  Recent Labs Lab 11/24/16 0606 11/25/16 0445 11/26/16 0612 11/27/16 0400  11/28/16 0405  NA 139 138 139 138 138  K 3.4* 3.8 3.6 3.3* 3.5  CL 107 107 107 104 105  CO2 24 26 24 27 26   GLUCOSE 89 97 86 94 98  BUN 7 10 10 6 8   CREATININE 0.79 0.80 0.92  0.95 0.79 0.78  CALCIUM 7.9* 8.3* 8.3* 8.4* 8.0*   GFR: Estimated Creatinine Clearance: 95.6 mL/min (by C-G formula based on SCr of 0.78 mg/dL). Liver Function Tests: No results for input(s): AST, ALT, ALKPHOS, BILITOT, PROT, ALBUMIN in the last 168 hours. No results for input(s): LIPASE, AMYLASE in the last 168 hours. No results for input(s): AMMONIA in the last 168 hours. Coagulation Profile: No results for input(s): INR, PROTIME in the last 168 hours. Cardiac Enzymes: No results for input(s): CKTOTAL, CKMB, CKMBINDEX, TROPONINI in the last 168 hours. BNP (last 3 results) No results for input(s): PROBNP in the last 8760 hours. HbA1C: No results for input(s): HGBA1C in the last 72 hours. CBG: No results for input(s): GLUCAP in the last 168 hours. Lipid Profile: No results for input(s): CHOL, HDL, LDLCALC, TRIG, CHOLHDL, LDLDIRECT in the last 72 hours. Thyroid Function Tests: No results for input(s): TSH, T4TOTAL, FREET4, T3FREE, THYROIDAB in the last 72 hours. Anemia Panel: No results for input(s): VITAMINB12, FOLATE, FERRITIN, TIBC, IRON, RETICCTPCT in the last 72 hours. Urine analysis:    Component Value Date/Time   COLORURINE YELLOW 11/10/2016 2132   APPEARANCEUR CLEAR 11/10/2016 2132   LABSPEC 1.006 11/10/2016 2132   PHURINE 5.0 11/10/2016 2132   GLUCOSEU NEGATIVE  11/10/2016 2132   HGBUR MODERATE (A) 11/10/2016 2132   BILIRUBINUR NEGATIVE 11/10/2016 2132   KETONESUR NEGATIVE 11/10/2016 2132   PROTEINUR NEGATIVE 11/10/2016 2132   UROBILINOGEN 0.2 08/05/2010 1821   NITRITE NEGATIVE 11/10/2016 2132   LEUKOCYTESUR SMALL (A) 11/10/2016 2132     Clemmie Marxen M.D. [facility] 11/28/2016, 11:42 AM  Contact via [email or web address], password [email or web address]  After 7pm go to [email or web address] - password  [email or web address]  Call night coverage person covering after 7pm

## 2016-11-28 NOTE — Progress Notes (Signed)
CSW spoke with patient regarding discharge plan updates. CSW explained that we have no yet found a facility to accept patient for her IV antibiotics. She requested that CSW speak with her mother tomorrow.   Osborne Cascoadia Dametrius Sanjuan LCSWA [phone]

## 2016-11-28 NOTE — Progress Notes (Signed)
Pt iv tubing is due to be changed, paged iv team about it Delaney Meigsamara called me on phone that they are aware of it but short of staff  she will pass it on to the first shift staff to come over to change it,

## 2016-11-29 LAB — CREATININE, SERUM
CREATININE: 0.71 mg/dL (ref 0.44–1.00)
GFR calc non Af Amer: >60 mL/min (ref >60)

## 2016-11-29 LAB — CBC
HCT: 29.4 % — ABNORMAL LOW (ref 36.0–46.0)
Hemoglobin: 9.4 g/dL — ABNORMAL LOW (ref 12.0–15.0)
MCH: 28 pg (ref 26.0–34.0)
MCHC: 32 g/dL (ref 30.0–36.0)
MCV: 87.5 fL (ref 78.0–100.0)
PLATELETS: 317 10*3/uL (ref 150–400)
RBC: 3.36 MIL/uL — ABNORMAL LOW (ref 3.87–5.11)
RDW: 15.4 % (ref 11.5–15.5)
WBC: 6.6 10*3/uL (ref 4.0–10.5)

## 2016-11-29 LAB — VANCOMYCIN, TROUGH: Vancomycin Tr: 23 ug/mL (ref 15–20)

## 2016-11-29 MED ORDER — VANCOMYCIN HCL IN DEXTROSE 750-5 MG/150ML-% IV SOLN
750.0000 mg | Freq: Two times a day (BID) | INTRAVENOUS | Status: DC
  Administered 2016-11-29 – 2016-12-09 (×22): 750 mg via INTRAVENOUS
  Filled 2016-11-29 (×24): qty 150

## 2016-11-29 NOTE — Clinical Social Work Note (Addendum)
Clinical Social Work Assessment  Patient Details  Name: Savannah Palmer MRN: [id number] Date of Birth: January 04, 1991  Date of referral:  11/29/16               Reason for consult:  Facility Placement                Permission sought to share information with:  Facility Medical sales representativeContact Representative, Family Supports Permission granted to share information::  Yes, Verbal Permission Granted  Name::     Savannah Palmer  Agency::  SNFs  Relationship::  Mother  Contact Information:  [phone]  Housing/Transportation Living arrangements for the past 2 months:  Apartment Source of Information:  Patient, Parent Patient Interpreter Needed:  None Criminal Activity/Legal Involvement Pertinent to Current Situation/Hospitalization:  No - Comment as needed Significant Relationships:  Parents Lives with:  Self Do you feel safe going back to the place where you live?  No Need for family participation in patient care:  Yes (Comment)  Care giving concerns:  CSW received consult for possible SNF placement at time of discharge so that patient can receive IV antibiotics for 6 more weeks. CSW spoke with patient. She reported wanting to remain on her medications to help with drug cravings. CSW explained SNF referral process and that the facility may be out of town. CSW to continue to follow and assist with discharge planning needs.   Social Worker assessment / plan:  CS[facility] with [facility]  Employment status:  [employer]iatric [employer] Insurance information:  Managed Care PT Recommendations:  Not assessed at this time Information / Referral to community resources:  Skilled Nursing Facility  Patient/Family's Response to care:  Patient recognizes need for continued IV antibiotics and is agreeable to placement. CSW explained potential barriers to placement. Patient also requested that I speak with her mother to answer her questions. CSW spoke with patient's mother to explain  SNF process.   Patient/Family's Understanding of and Emotional Response to Diagnosis, Current Treatment, and Prognosis:  Patient/family is realistic regarding therapy needs and expressed being hopeful for SNF placement. She reported wanting to travel with her mom in their camper so she can get away from the drug influences. Patient expressed understanding of CSW role and discharge process. No questions/concerns about plan or treatment.    Emotional Assessment Appearance:  Appears stated age Attitude/Demeanor/Rapport:  Other (Appropriate) Affect (typically observed):  Accepting, Adaptable, Appropriate Orientation:  Oriented to Self, Oriented to Situation, Oriented to Place, Oriented to  Time Alcohol / Substance use:  Illicit Drugs Psych involvement (Current and /or in the community):  No (Comment)  Discharge Needs  Concerns to be addressed:  Care Coordination, Substance Abuse Concerns Readmission within the last 30 days:  No Current discharge risk:  Substance Abuse Barriers to Discharge:  Active Substance Use   Mearl Latinadia S Ellory Khurana, LCSWA 11/29/2016, 5:55 PM

## 2016-11-29 NOTE — Progress Notes (Signed)
Pharmacy Antibiotic Note  Savannah Palmer is a 26 y.o. 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 Pharmacy has been consulted for Vancomycin dosing.  A vancomycin trough drawn this morning was slightly above goal at 423mcg/mL.  Plan: Decrease Vancomycin to 750mg  IV q12 Repeat Vanc trough at steady state Follow dispo planning  Height: 5\' 3"  (160 cm) Weight: 134 lb 3.2 oz (60.9 kg) IBW/kg (Calculated) : 52.4  Temp (24hrs), Avg:98.5 F (36.9 C), Min:98.5 F (36.9 C), Max:98.5 F (36.9 C)   Recent Labs Lab 11/24/16 0606 11/25/16 0445 11/26/16 0612 11/26/16 1739 11/27/16 0400 11/28/16 0405 11/29/16 0409 11/29/16 0743  WBC 5.4 5.6 5.5  --   --  6.8 6.6  --   CREATININE 0.79 0.80 0.92  0.95  --  0.79 0.78 0.71  --   VANCOTROUGH  --   --  38*  --   --   --   --  23*  VANCORANDOM  --   --   --  19  --   --   --   --     Estimated Creatinine Clearance: 88.2 mL/min (by C-G formula based on SCr of 0.71 mg/dL).    No Known Allergies  5/26 Zosyn x 1 5/26 Vanc >> 6/1;   6/8 >> 5/26 Cefepime >> 5/27 5/31 Teflaro >> 6/8 6/2 Daptomycin >> 6/8   5/27: increase vanc to 750 q8 with improved renal function 5/29 1049 VT: 13 (subtherapeutic) on 750mg  q8h prior to 7th dose, inc to 1g q8h 5/31 2030 VT: 19 (therapeutic) - continue 1g q8h 6/11 0600 VT: 38 (accumulated on q8h dose)  VANC HOLD 6/11 VR 19 (t1/2 ~11.5 > reduced to 1000mg  q12) 6/14 VT 23 on 1g q12h > reduced to 750mg  q12h  5/26 UCx: NGF 5/26 BCx: 4/4 MRSA 5/26 MRSA PCR: positive  5/27 BCx: 2/2 MRSA  5/28 BCx: 2/2 MRSA 5/28 HIV antibody: non reactive 5/28 HCV RNA: ND 5/29 BCx: MRSA, BCID = MRSA 5/30 @ 0033 BCx: 1/2 MRSA 5/30 @ 1610 BCx: ngF 6/1 pleural fluid Cx: MRSA 6/4 BCx: ngF    Thank you for allowing pharmacy to be a part of this patient's care.  [name] D. Savannah Palmer, PharmD, BCPS Clinical Pharmacist Pager: [phone] Clinical Phone for 11/29/2016  until 3:30pm: [phone] If after 3:30pm, please call main pharmacy at [phone] 11/29/2016 9:32 AM

## 2016-11-29 NOTE — Progress Notes (Signed)
[facility]                                                                              Patient Demographics  Savannah Palmer, is a 26 y.o. female, DOB - 13-Dec-1990, ZOX:[id number]  Admit date - 11/10/2016   Admitting Physician Coralyn Helling, MD  Outpatient Primary MD for the patient is Patient, No Pcp Per  Outpatient specialists:   LOS - 19  days   Medical records reviewed and are as summarized below:    No chief complaint on file.      Brief summary    26 year old Caucasian female with a past medical history of anxiety, depression, heroin abuse, presented with weakness, cough and chest pain. Initially was in septic shock and required pressors. Blood cultures grew MRSA. 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 She was placed on IV antibiotics. She also developed a spontaneous right-sided pneumothorax. Chest tube was placed by critical care medicine. She was transferred to [facility] for opinion from cardiothoracic surgery. Patient is not a candidate for surgery at this time.   Assessment & Plan    Principal Problem: MRSA bacteremia/Septic shock with right TV endocarditis and septic emboli to lungs/MRSA empyema - Patient presented with weakness, coughing, chest pain and septic shock requiring vasopressors. Blood cultures 5/27 grew out MRSA and was found to have septic emboli throughout her lungs.  -Repeat cultures were still positive, blood cultures repeat on 6/4 has been negative to date  - 2-D echo on 5/27 showed tricuspid vegetations with moderate TR. TEE on 6/1 showed large tricuspid vegetation, severe TR with dilated RV along with a loculated left effusion - HIV, Hep C negative. Due to family request, RPR was done and it was negative. - Due to persistent bacteremia patient was started on daptomycin 6/2 and Ceftaroline was continued to cover lung infection. Per ID  recommendations, Ceftaroline and daptomycin have been now discontinued. - cardiothoracic surgery was also consulted and patient was deemed not a candidate for surgery at this time.  - on vancomycin duration through July 15, will need BMP, CBC weekly, trough goal of 15-20.  - PICC line placed on 6/9, continue vancomycin, until July 15. Vanco trough in goal 19 -  Pending skilled nursing facility placement   Active problems: Right spontaneous pneumothorax- acute respiratory failure/MRSA Empyema -Right empyema with pneumothorax, positive for MRSA. Status post chest tube. - Chest tube removed on 6/7, chest x-ray clear   Acute kidney injury -Baseline creatinine 0.7, creatinine increased to 1.37, likely secondary to dehydration and NSAIDs. - patient received IV fluids, NSAIDs were discontinued, and creatinine function has normalized.   RUQ pain, likely musculoskeletal -CT abdomen and pelvis showed no liver or gallbladder abnormalities, possible musculoskeletal origin, currently stable.    Ascites, anasarca due to right sided hear failure - as a result of severe TR, net positive balance of 12L - O2 sats 96% on room air  Splenomegaly - etiology undetermined- CT on 5/30 revealed that the spleen is enlarged measuring 15.7 x 5.6 x 17 cm (volume= 780 cm^3) - liver  is unremarkable on CT  Normocytic Anemia - H&H currently stable, was transfused on 6/5, no overt bleeding noted - Iron was 12. Ferritin 213. B-12 1850. Folate 7.8. - Hemoglobin down to 7.1 on 6/10, patient was transfused 2 units packed RBC with 1 dose of IV Feraheme, currently stable  Hypokalemia/Hypophosphatemia/Hypomagnesemia - Potassium 3.3, replaced  Thrombocytopenia - Resolved  Heroin abuse - cont Suboxone, will need transition to a drug rehab program once stable.  Anxiety - Celexa discontinued per patient's request. Buspar restarted again today per patient's request.  - continue Neurontin and Xanax as needed at  this time.   Nicotine dependence -Continue  Nicoderm patch   Code Status: Full CODE STATUS  DVT Prophylaxis:  heparin  Family Communication: Discussed in detail with the patient, all imaging results, lab results explained to the patient and patient's mother at the bedside. Requested patient's mother not to video record any staff or providers.    Disposition Plan: Hopefully DC to skilled nursing facility when a bed is available. Disposition difficulty due to patient being on suboxone and history of IVDA   Time Spent in minutes  25 minutes  Procedures:  TEE  - Left ventricle: The cavity size was normal. Wall thickness was normal. Systolic function was normal. The estimated ejection fraction was in the range of 55% to 60%. Wall motion was normal; there were no regional wall motion abnormalities. - Left atrium: No evidence of thrombus in the atrial cavity or appendage. - Right ventricle: The cavity size was mildly dilated. Wall thickness was normal. - Right atrium: No evidence of thrombus in the atrial cavity or appendage. - Tricuspid valve: Flail motion of the posterior leaflet. There is a 8x5 mm vegetation attached to the posterior leaflet, but most of the &quot;mass&quot; described on transthoracic imaging appears to be the flail segment of the valve. There was severe regurgitation directed eccentrically and toward the free wall.  Right chest tube placement for pneumothorax 6/1  Placement of a larger bore chest tube in the right chest 6/3  PICC line placed on 6/9  Consultants:   Infectious disease Cardiothoracic surgery  Antimicrobials:   Daptomycin 6/2> 6/8  Teflaro 5/31> 6/8  Vancomycin 6/8>> will need till July 15   Medications  Scheduled Meds: . acetaminophen  1,000 mg Oral Q6H  . buprenorphine-naloxone  2 tablet Sublingual Daily  . busPIRone  10 mg Oral BID  . gabapentin  300 mg Oral TID  . heparin  5,000 Units Subcutaneous Q8H    . nicotine  14 mg Transdermal Daily  . saccharomyces boulardii  250 mg Oral BID   Continuous Infusions: . sodium chloride 250 mL (11/28/16 2032)  . vancomycin 750 mg (11/29/16 1013)   PRN Meds:.sodium chloride, albuterol, ALPRAZolam, docusate sodium, ibuprofen, nicotine polacrilex, ondansetron (ZOFRAN) IV, oxyCODONE, sodium chloride flush   Antibiotics   Anti-infectives    Start     Dose/Rate Route Frequency Ordered Stop   11/29/16 1000  vancomycin (VANCOCIN) IVPB 750 mg/150 ml premix     750 mg 150 mL/hr over 60 Minutes Intravenous Every 12 hours 11/29/16 0934     11/26/16 2000  vancomycin (VANCOCIN) IVPB 1000 mg/200 mL premix  Status:  Discontinued     1,000 mg 200 mL/hr over 60 Minutes Intravenous Every 12 hours 11/26/16 1912 11/29/16 0853   11/24/16 0000  vancomycin (VANCOCIN) IVPB 1000 mg/200 mL premix  Status:  Discontinued     1,000 mg 200 mL/hr over 60 Minutes Intravenous Every 8 hours 11/23/16  1238 11/26/16 0721   11/23/16 1600  vancomycin (VANCOCIN) 1,500 mg in sodium chloride 0.9 % 500 mL IVPB     1,500 mg 250 mL/hr over 120 Minutes Intravenous  Once 11/23/16 1238 11/23/16 1733   11/17/16 1600  DAPTOmycin (CUBICIN) 500 mg in sodium chloride 0.9 % IVPB  Status:  Discontinued     500 mg 220 mL/hr over 30 Minutes Intravenous Every 24 hours 11/17/16 1432 11/23/16 1238   11/17/16 1500  ceftaroline (TEFLARO) 600 mg in sodium chloride 0.9 % 250 mL IVPB  Status:  Discontinued     600 mg 250 mL/hr over 60 Minutes Intravenous Every 12 hours 11/17/16 1435 11/23/16 1238   11/15/16 1800  ceftaroline (TEFLARO) 600 mg in sodium chloride 0.9 % 250 mL IVPB  Status:  Discontinued     600 mg 250 mL/hr over 60 Minutes Intravenous Every 12 hours 11/15/16 1635 11/17/16 1431   11/13/16 1215  vancomycin (VANCOCIN) IVPB 1000 mg/200 mL premix  Status:  Discontinued     1,000 mg 200 mL/hr over 60 Minutes Intravenous Every 8 hours 11/13/16 1203 11/16/16 0850   11/11/16 1100  vancomycin  (VANCOCIN) IVPB 750 mg/150 ml premix  Status:  Discontinued     750 mg 150 mL/hr over 60 Minutes Intravenous Every 8 hours 11/11/16 0956 11/13/16 1203   11/11/16 0400  vancomycin (VANCOCIN) 500 mg in sodium chloride 0.9 % 100 mL IVPB  Status:  Discontinued     500 mg 100 mL/hr over 60 Minutes Intravenous Every 12 hours 11/10/16 1951 11/11/16 0956   11/10/16 2200  ceFEPIme (MAXIPIME) 1 g in dextrose 5 % 50 mL IVPB  Status:  Discontinued     1 g 100 mL/hr over 30 Minutes Intravenous Every 12 hours 11/10/16 1951 11/11/16 0926   11/10/16 2000  ceFEPIme (MAXIPIME) 2 g in dextrose 5 % 50 mL IVPB  Status:  Discontinued     2 g 100 mL/hr over 30 Minutes Intravenous  Once 11/10/16 1952 11/10/16 1954   11/10/16 2000  vancomycin (VANCOCIN) IVPB 1000 mg/200 mL premix  Status:  Discontinued     1,000 mg 200 mL/hr over 60 Minutes Intravenous  Once 11/10/16 1952 11/10/16 1954   11/10/16 1430  piperacillin-tazobactam (ZOSYN) IVPB 3.375 g     3.375 g 100 mL/hr over 30 Minutes Intravenous  Once 11/10/16 1426 11/10/16 1503   11/10/16 1430  vancomycin (VANCOCIN) IVPB 1000 mg/200 mL premix     1,000 mg 200 mL/hr over 60 Minutes Intravenous  Once 11/10/16 1426 11/10/16 1653        Subjective:   Adria Devon was seen and examined today. No complaints except feeling more anxious today. Wants to restart buspar back. No fevers or chills. Patient denies dizziness,  shortness of breath, abdominal pain, N/V/D/C, new weakness, numbess, tingling. No acute events overnight.    Objective:   Vitals:   11/28/16 0541 11/28/16 1458 11/28/16 2150 11/29/16 0530  BP: 136/81 (!) 134/95 118/85 130/83  Pulse: (!) 57 (!) 59 65 69  Resp: 18 20 18 18   Temp: 98.4 F (36.9 C) 98.5 F (36.9 C)  98.5 F (36.9 C)  TempSrc:  Oral  Oral  SpO2: 98% 97% 95% 95%  Weight: 63.5 kg (140 lb 1.6 oz)   60.9 kg (134 lb 3.2 oz)  Height:        Intake/Output Summary (Last 24 hours) at 11/29/16 1102 Last data filed at 11/29/16  0600  Gross per 24 hour  Intake  325.67 ml  Output                0 ml  Net           325.67 ml     Wt Readings from Last 3 Encounters:  11/29/16 60.9 kg (134 lb 3.2 oz)  03/14/16 58.5 kg (129 lb)  01/19/16 58.1 kg (128 lb)     Exam  General: Alert and oriented x 3, NAD  Eyes: EOMI PERRLA  HEENT: Normocephalic atraumatic  Cardiovascular: S1 and S2 clear, RRR systolic murmur in the tricuspid area   Respiratory: CTA B  Gastrointestinal: Soft, NT, ND, NBS  Ext: no pedal edema bilaterally  Neuro: no FND  Musculoskeletal: No cyanosis, clubbing  Skin: No rashes  Psych: slightly anxious, alert and oriented 3   Data Reviewed:  I have personally reviewed following labs and imaging studies  Micro Results No results found for this or any previous visit (from the past 240 hour(s)).  Radiology Reports Dg Chest 2 View  Result Date: 11/23/2016 CLINICAL DATA:  Chest tube removal EXAM: CHEST  2 VIEW COMPARISON:  11/21/2016 FINDINGS: Interval removal of right chest tube with residual small right pleural effusion versus pleural thickening. No pneumothorax. Patchy bilateral airspace opacities are again noted, unchanged. Heart is borderline in size. IMPRESSION: Interval removal of right chest tube without pneumothorax. Otherwise no change. Electronically Signed   By: Charlett Nose M.D.   On: 11/23/2016 08:44   Dg Chest 2 View  Result Date: 11/21/2016 CLINICAL DATA:  Chest soreness.  Chest tube . EXAM: CHEST  2 VIEW COMPARISON:  CT 11/20/2016.  Chest x-ray 11/20/2016 FINDINGS: Chest tube noted on the right. Tiny right anterior hydropneumothorax again noted. No interim change . Stable cardiomegaly. Persistent bilateral pulmonary nodules including cavitary nodules. Reference is made to prior CT report. Small right pleural effusion. No pneumothorax. IMPRESSION: 1. Right chest tube in stable position. Tiny right anterior hydropneumothorax again noted. No interim change. 2. Multiple  bilateral pulmonary nodules with cavitation. Reference is made to prior CT report of 11/20/2016 . Electronically Signed   By: Maisie Fus  Register   On: 11/21/2016 11:15   Dg Chest 2 View  Result Date: 11/19/2016 CLINICAL DATA:  Pneumothorax. EXAM: CHEST  2 VIEW COMPARISON:  11/19/2016. FINDINGS: Right chest tube in stable position. No significant pneumothorax noted on today's exam. Small right pleural effusion. Bilateral patchy pulmonary infiltrates with cavitation again noted. Small left pleural effusion noted. Heart size stable. No acute bony abnormality . IMPRESSION: 1. Right chest tube in stable position. No pneumothorax noted on today's exam. Small right pleural effusion. 2. Persistent bilateral cavitary pulmonary infiltrates. Small left pleural effusion. Electronically Signed   By: Maisie Fus  Register   On: 11/19/2016 16:53   Dg Chest 2 View  Result Date: 11/10/2016 CLINICAL DATA:  Chest pain, shortness of breath for 1 week EXAM: CHEST  2 VIEW COMPARISON:  CT chest 03/12/2016, chest x-ray 03/12/2016 FINDINGS: There are bilateral nodular airspace opacities throughout the upper and lower lobes bilaterally. There is no pleural effusion or pneumothorax. The heart and mediastinal contours are unremarkable. The osseous structures are unremarkable. IMPRESSION: New bilateral nodular airspace opacities throughout the upper and lower lobes bilaterally. Findings are concerning for multifocal infection such as septic emboli. Recommend further evaluation with a CT of the chest with intravenous contrast. Electronically Signed   By: Elige Ko   On: 11/10/2016 13:23   Ct Chest Wo Contrast  Result Date: 11/20/2016 CLINICAL DATA:  Follow-up  right pleural effusion EXAM: CT CHEST WITHOUT CONTRAST TECHNIQUE: Multidetector CT imaging of the chest was performed following the standard protocol without IV contrast. COMPARISON:  Radiograph 11/20/2016, CT chest 11/10/2016, prior radiographs dating back to 11/10/2016 FINDINGS:  Cardiovascular: Limited assessment without intravenous contrast. Small fluid in the superior pericardial recess. There is cardiomegaly. Mediastinum/Nodes: Difficult evaluation without contrast. Mediastinal adenopathy is present right paratracheal soft tissue fullness, possible adenopathy does not appear significantly changed. Midline trachea. Esophagus grossly unremarkable. Lungs/Pleura: Interim placement of a right lower lateral chest tube since the prior CT with the tip terminating along the right upper posterior pleural surface. Small bilateral pleural effusions right greater than left, slightly increased compared to the previous CT. Tiny right anterior hydropneumothorax. Multiple bilateral pulmonary nodules and cavitary lesions. Some of the cavitary lesions have decreased, however if there appears to be increased nodules/disease most notable in the left upper lobe and the right lower lobe. Decreased septal thickening compared to the previous exam. Upper Abdomen: Spleen appears enlarged. Partially visualized surgical clips in the gallbladder fossa. Musculoskeletal: No acute or suspicious bone lesion. IMPRESSION: 1. Interim placement of right-sided chest tube. Small residual pleural effusions right greater than left, mildly increased compared to the prior chest CT. Small right anterior hydropneumothorax. 2. Innumerable bilateral lung nodules and cavitary lesions, suspected to represent septic emboli. This appears increased in the left upper and right lower lobe since the prior CT. 3. Suspect mediastinal adenopathy although evaluation of the mediastinum is limited without contrast 4. Splenomegaly Electronically Signed   By: Jasmine Pang M.D.   On: 11/20/2016 21:25   Ct Chest Wo Contrast  Result Date: 11/10/2016 CLINICAL DATA:  She c/o some chest discomfrot and shortness of breath x 1 week. She states these are similar s/s she had a year ago, at which time she was dx with "pulmonary abscess". She is in no  distress. IV drug user EXAM: CT CHEST WITHOUT CONTRAST TECHNIQUE: Multidetector CT imaging of the chest was performed following the standard protocol without IV contrast. COMPARISON:  Chest CT angiogram dated 03/12/2016. FINDINGS: Cardiovascular: Probable small pericardial effusion and/or pericardial thickening, difficult to characterize without IV contrast. Heart size is within normal limits. Thoracic aorta appears to be normal in caliber. Mediastinum/Nodes: Soft tissue density material within the right upper peritracheal space, also difficult to characterize without IV contrast, presumed lymphadenopathy. Additional mild lymphadenopathy within the anterior mediastinum and aortopulmonary window region. Lungs/Pleura: Numerous cavitary consolidations throughout both lungs, peripherally distributed suggesting septic emboli. More confluent consolidations at each lung base, more likely atelectasis than pneumonia. Small right pleural effusion. Upper Abdomen: Suspected splenomegaly, incompletely imaged. Musculoskeletal: No acute or suspicious osseous finding. IMPRESSION: 1. Numerous cavitary consolidations throughout both lungs, with a peripheral distribution suggesting septic emboli. Largest cavitary consolidation is within the left lower lobe measuring approximately 4 cm greatest dimension. 2. Probable small pericardial effusion and/or pericardial wall thickening, difficult to delineate without IV contrast. 3. Probable mediastinal lymphadenopathy, again difficult to definitively characterize without IV contrast, alternatively confluent fluid or edema within the mediastinum. 4. Small right pleural effusion. 5. Probable splenomegaly, incompletely imaged at the lower aspects of this chest CT. 6. Right IJ line in place with tip passing through the right atrium and into the right ventricle. Recommend retracting to the cavoatrial junction for optimal radiographic positioning. These results and recommendations were called by  telephone at the time of interpretation on 11/10/2016 at 7:35 pm to Dr. Katrinka Blazing , who verbally acknowledged these results. Electronically Signed   By: Weyman Croon  Linde Gillis M.D.   On: 11/10/2016 19:36   Ct Abdomen Pelvis W Contrast  Result Date: 11/14/2016 CLINICAL DATA:  Right upper quadrant pain times 2-3 days. Polysubstance abuse. Cholecystectomy. History of endocarditis and septic emboli. EXAM: CT ABDOMEN AND PELVIS WITH CONTRAST TECHNIQUE: Multidetector CT imaging of the abdomen and pelvis was performed using the standard protocol following bolus administration of intravenous contrast. CONTRAST:  80mL ISOVUE-300 IOPAMIDOL (ISOVUE-300) INJECTION 61% COMPARISON:  03/12/2016 chest CT FINDINGS: Lower chest: Small right effusion with several bilateral cavitary opacities in both lower lobes concerning for septic emboli or cavitary pneumonia. More confluent airspace opacity in the left lower lobe is also noted. The visualized heart is normal in size. There is no pericardial effusion. No large central pulmonary embolus. Hepatobiliary: Normal appearance of the liver without space-occupying mass or biliary dilatation. Cholecystectomy. Pancreas: No pancreatic mass or ductal dilatation. Mild enlargement of the pancreatic head with surrounding peripancreatic fluid is noted. Correlate to exclude a pancreatitis. Spleen: The spleen is enlarged measuring 15.7 x 5.6 x 17 cm (volume = 780 cm^3) and demonstrates a developmental cleft, partially visualized on prior chest CT and therefore believed less likely to represent a laceration. Adrenals/Urinary Tract: Adrenal glands are unremarkable. Kidneys are normal, without renal calculi, solid appearing lesions, or hydronephrosis. There appear to be pair of water attenuating cysts in the lower pole the right kidney measuring up to 2 x 1.3 cm in aggregate. Bladder is unremarkable. Stomach/Bowel: Stomach is within normal limits. Appendix appears normal. No evidence of bowel wall thickening,  distention, or inflammatory changes. Vascular/Lymphatic: No significant vascular findings are present. No enlarged abdominal or pelvic lymph nodes. Reproductive: Uterus and bilateral adnexa are unremarkable. Other: Moderate volume of ascites.  Anasarca. Musculoskeletal: No acute or significant osseous findings. IMPRESSION: 1. Bibasilar, multifocal cavitary lesions noted within the visualized lower lobes consistent with septic emboli or possibly cavitary pneumonia. More confluent airspace disease in the left lower lobe without cavitation is also present. There is a small right pleural effusion. 2. Anasarca with moderate volume of ascites noted within the abdomen and pelvis. Question right heart dysfunction/failure. 3. Splenomegaly. 4. Water attenuating cysts in the lower pole the right kidney in aggregate measuring 2 x 1.3 cm. 5. Status post cholecystectomy. Electronically Signed   By: Tollie Eth M.D.   On: 11/14/2016 16:26   Dg Chest 1v Repeat Same Day  Result Date: 11/18/2016 CLINICAL DATA:  Chest tube placement EXAM: CHEST - 1 VIEW SAME DAY COMPARISON:  Earlier same day FINDINGS: Large bore chest tube placed on the right along the lateral margin. Marked reduction an amount of right pleural air, nearly completely evacuated. Patchy infiltrates persist in both lower lobes. IMPRESSION: Large bore chest tube placed. Near complete resolution of right pneumothorax. Electronically Signed   By: Paulina Fusi M.D.   On: 11/18/2016 11:40   Dg Chest Port 1 View  Result Date: 11/20/2016 CLINICAL DATA:  26 year old female with pneumothorax. Recent transesophageal echocardiogram. Subsequent encounter. EXAM: PORTABLE CHEST 1 VIEW COMPARISON:  11/20/2016 8:30 a.m. FINDINGS: Right-sided chest tube remains in place. No pneumothorax detected on this frontal projection. Cavitary lesions bilaterally. Left base consolidation. Atelectasis/pleural thickening right lung base. Cardiomegaly. IMPRESSION: No significant change since exam  earlier today. Electronically Signed   By: Lacy Duverney M.D.   On: 11/20/2016 13:55   Dg Chest Port 1 View  Result Date: 11/20/2016 CLINICAL DATA:  Chest tube, shortness of Breath EXAM: PORTABLE CHEST 1 VIEW COMPARISON:  11/19/2016 FINDINGS: Right chest tube remains in  place, unchanged. No pneumothorax. Patchy bilateral airspace opacities are again noted, some with cavitation. Small right pleural effusion. Heart is borderline in size. IMPRESSION: Right chest tube remains in place without pneumothorax. Stable patchy bilateral airspace disease, some of which is cavitary. Electronically Signed   By: Charlett Nose M.D.   On: 11/20/2016 08:42   Dg Chest Port 1 View  Result Date: 11/19/2016 CLINICAL DATA:  26 year old female with cavitary lung lesions possibly from septic endocarditis. Subsequent encounter. EXAM: PORTABLE CHEST 1 VIEW COMPARISON:  11/18/2016. FINDINGS: Right-sided chest tube remains in place. Tiny hydropneumothorax versus fluid within minor fissure noted. Multiple cavitary lesions. Right-sided pleural effusion. Basilar atelectasis versus infiltrate greater on the left. Cardiomegaly. Mild scoliosis. IMPRESSION: Right-sided chest tube remains in place. Tiny hydropneumothorax versus fluid within minor fissure. Multiple cavitary lesions. Right-sided pleural effusion. Basilar atelectasis versus infiltrate greater on the left. Electronically Signed   By: Lacy Duverney M.D.   On: 11/19/2016 07:16   Dg Chest Port 1 View  Result Date: 11/18/2016 CLINICAL DATA:  Followup left pneumothorax. Patient with bacterial endocarditis. EXAM: PORTABLE CHEST 1 VIEW COMPARISON:  11/17/2016 and prior studies FINDINGS: A moderate right hydropneumothorax is unchanged in size, but now with pleural fluid in its basilar portion. A right thoracostomy tube is unchanged. Scattered pulmonary opacities/ nodules and left lower lung consolidations/atelectasis again noted. There has been no other interval change. IMPRESSION:  Moderate right hydropneumothorax, unchanged in size but now with pleural fluid in its basilar portion. Right thoracostomy tube remains unchanged. Electronically Signed   By: Harmon Pier M.D.   On: 11/18/2016 10:14   Dg Chest Port 1 View  Result Date: 11/17/2016 CLINICAL DATA:  Follow-up right-sided chest tube placement. Initial encounter. EXAM: PORTABLE CHEST 1 VIEW COMPARISON:  Chest radiograph performed 11/16/2016 FINDINGS: The patient's moderate right-sided pneumothorax has decreased in size. The right-sided chest tube is grossly unchanged in appearance. Vascular congestion is noted. Patchy left-sided airspace opacities raise concern for pneumonia. Right-sided airspace opacity may reflect atelectasis. A small left pleural effusion is noted. The cardiomediastinal silhouette is mildly enlarged. No acute osseous abnormalities are identified. IMPRESSION: 1. Moderate right-sided pneumothorax has decreased in size. Right-sided chest tube is grossly unchanged in appearance. 2. Vascular congestion and mild cardiomegaly. Patchy left-sided airspace opacities raise concern for pneumonia. Small left pleural effusion noted. 3. Right-sided airspace opacity may reflect atelectasis. Electronically Signed   By: Roanna Raider M.D.   On: 11/17/2016 00:33   Dg Chest Port 1 View  Result Date: 11/16/2016 CLINICAL DATA:  Chest tube placement for right pneumothorax. EXAM: PORTABLE CHEST 1 VIEW COMPARISON:  Single-view of the chest earlier today and 11/11/2016. FINDINGS: Pigtail catheter is now in place in the right chest. Large right pneumothorax is unchanged. Patchy airspace disease in cavitary lesions in the left chest persist. Heart size is normal. IMPRESSION: No change in large right pneumothorax after chest tube placement. No change in patchy airspace disease in cavitary lesions on the left. These results were called by telephone at the time of interpretation on 11/16/2016 at 8:10 pm to Estrella Deeds, RN, who verbally  acknowledged these results. Electronically Signed   By: Drusilla Kanner M.D.   On: 11/16/2016 20:12   Dg Chest Port 1 View  Result Date: 11/16/2016 CLINICAL DATA:  Shortness of Breath EXAM: PORTABLE CHEST 1 VIEW COMPARISON:  11/11/2016 FINDINGS: Cardiac shadow is within normal limits. Cavitary nodular densities are noted throughout both lungs similar to that seen on previous CT examination. A new right-sided moderately  large pneumothorax is noted with consolidation of the lower lobe on the right. No acute bony abnormality is noted. IMPRESSION: Moderately large right-sided pneumothorax new from the prior exam. Near complete collapse of the right lower lobe is noted. Stable cavitary lesions within both lungs. Critical Value/emergent results were called by telephone at the time of interpretation on 11/16/2016 at 6:24 pm to Dr Leitha Bleak, who verbally acknowledged these results. Electronically Signed   By: Alcide Clever M.D.   On: 11/16/2016 18:27   Dg Chest Port 1 View  Result Date: 11/11/2016 CLINICAL DATA:  Check right IJ line placement.  Initial encounter. EXAM: PORTABLE CHEST 1 VIEW COMPARISON:  Chest radiograph performed 11/10/2016 FINDINGS: The right IJ line is noted ending at the right atrium. This could be retracted 2-3 cm. Mild vascular congestion is noted. Mild left-sided opacities may reflect atelectasis or mild pneumonia. No pleural effusion or pneumothorax is seen. The cardiomediastinal silhouette is borderline normal in size. No acute osseous abnormalities are identified. IMPRESSION: 1. Right IJ line noted ending at the right atrium. This could be retracted 2-3 cm, as deemed clinically appropriate. 2. Mild vascular congestion noted. 3. Mild left-sided airspace opacities may reflect atelectasis or mild pneumonia. Electronically Signed   By: Roanna Raider M.D.   On: 11/11/2016 00:37   Dg Chest Port 1 View  Result Date: 11/10/2016 CLINICAL DATA:  Retraction of the IJ line about 10 cm EXAM: PORTABLE  CHEST 1 VIEW COMPARISON:  CT chest 11/10/2016.  Chest 11/10/2016. FINDINGS: The left central venous catheter tip localizes to the mid/distal right atrial region. If placement at or above the cavoatrial junction is desired, retraction of about 3-4 cm is recommended. No pneumothorax. Shallow inspiration with atelectasis in the lung bases. Borderline heart size. Patchy airspace infiltrates in both lungs could indicate edema, aspiration, or pneumonia. No blunting of costophrenic angles. IMPRESSION: Central venous catheter tip is in the mid/ low right atrial region. If placement at or above the cavoatrial junction is desired, retraction of about 3-4 cm is recommended. Electronically Signed   By: Burman Nieves M.D.   On: 11/10/2016 21:35   Dg Chest Portable 1 View  Result Date: 11/10/2016 CLINICAL DATA:  Status post central line placement. EXAM: PORTABLE CHEST 1 VIEW COMPARISON:  Chest x-ray from earlier same day. FINDINGS: Interval placement of a right IJ central line with tip passing to the left of midline and likely in the right ventricle or main pulmonary artery. The bilateral small nodular airspace opacities throughout both lungs are unchanged in the short-term interval. Suspect small bilateral pleural effusions. No pneumothorax seen. IMPRESSION: 1. Right IJ central line placement with tip positioned to the left of midline either within the right ventricle or main pulmonary artery. Recommend retracting at least 10 cm. 2. Bilateral small nodular airspace opacities throughout both lungs are unchanged in the short-term interval. As recommended on earlier chest x-ray report, would consider chest CT for further characterization. 3. Suspect small bilateral pleural effusions. Electronically Signed   By: Bary Richard M.D.   On: 11/10/2016 18:37    Lab Data:  CBC:  Recent Labs Lab 11/24/16 0606 11/25/16 0445 11/26/16 0612 11/28/16 0405 11/29/16 0409  WBC 5.4 5.6 5.5 6.8 6.6  HGB 7.5* 7.1* 9.5* 9.7* 9.4*   HCT 23.8* 22.7* 29.2* 30.4* 29.4*  MCV 86.5 88.3 87.7 86.6 87.5  PLT 360 332 326 329 317   Basic Metabolic Panel:  Recent Labs Lab 11/24/16 0606 11/25/16 0445 11/26/16 0612 11/27/16 0400 11/28/16 0405 11/29/16  0409  NA 139 138 139 138 138  --   K 3.4* 3.8 3.6 3.3* 3.5  --   CL 107 107 107 104 105  --   CO2 24 26 24 27 26   --   GLUCOSE 89 97 86 94 98  --   BUN 7 10 10 6 8   --   CREATININE 0.79 0.80 0.92  0.95 0.79 0.78 0.71  CALCIUM 7.9* 8.3* 8.3* 8.4* 8.0*  --    GFR: Estimated Creatinine Clearance: 88.2 mL/min (by C-G formula based on SCr of 0.71 mg/dL). Liver Function Tests: No results for input(s): AST, ALT, ALKPHOS, BILITOT, PROT, ALBUMIN in the last 168 hours. No results for input(s): LIPASE, AMYLASE in the last 168 hours. No results for input(s): AMMONIA in the last 168 hours. Coagulation Profile: No results for input(s): INR, PROTIME in the last 168 hours. Cardiac Enzymes: No results for input(s): CKTOTAL, CKMB, CKMBINDEX, TROPONINI in the last 168 hours. BNP (last 3 results) No results for input(s): PROBNP in the last 8760 hours. HbA1C: No results for input(s): HGBA1C in the last 72 hours. CBG: No results for input(s): GLUCAP in the last 168 hours. Lipid Profile: No results for input(s): CHOL, HDL, LDLCALC, TRIG, CHOLHDL, LDLDIRECT in the last 72 hours. Thyroid Function Tests: No results for input(s): TSH, T4TOTAL, FREET4, T3FREE, THYROIDAB in the last 72 hours. Anemia Panel: No results for input(s): VITAMINB12, FOLATE, FERRITIN, TIBC, IRON, RETICCTPCT in the last 72 hours. Urine analysis:    Component Value Date/Time   COLORURINE YELLOW 11/10/2016 2132   APPEARANCEUR CLEAR 11/10/2016 2132   LABSPEC 1.006 11/10/2016 2132   PHURINE 5.0 11/10/2016 2132   GLUCOSEU NEGATIVE 11/10/2016 2132   HGBUR MODERATE (A) 11/10/2016 2132   BILIRUBINUR NEGATIVE 11/10/2016 2132   KETONESUR NEGATIVE 11/10/2016 2132   PROTEINUR NEGATIVE 11/10/2016 2132   UROBILINOGEN  0.2 08/05/2010 1821   NITRITE NEGATIVE 11/10/2016 2132   LEUKOCYTESUR SMALL (A) 11/10/2016 2132     Vianne Grieshop M.D. [facility] 11/29/2016, 11:02 AM  Pager: [phone] Between 7am to 7pm - call Pager - [phone]  After 7pm go to [email or web address] - password [email or web address]  Call night coverage person covering after 7pm            [facility]                                                                              Patient Demographics  Savannah Palmer, is a 26 y.o. female, DOB - [birth year], YNW:[id number]  Admit date - 11/10/2016   Admitting Physician Coralyn Helling, MD  Outpatient Primary MD for the patient is Patient, No Pcp Per  Outpatient specialists:   LOS - 19  days   Medical records reviewed and are as summarized below:    No chief complaint on file.      Brief summary    26 year old Caucasian female with a past medical history of anxiety, depression, heroin abuse, presented with weakness, cough and chest pain. Initially was in septic shock and required pressors. Blood cultures grew MRSA. 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 She was placed on IV antibiotics. She also  developed a spontaneous right-sided pneumothorax. Chest tube was placed by critical care medicine. She was transferred to [facility] for opinion from cardiothoracic surgery. Patient is not a candidate for surgery at this time.   Assessment & Plan    Principal Problem: MRSA bacteremia/Septic shock with right TV endocarditis and septic emboli to lungs/MRSA empyema - Admitted with septic shock to ICU requiring vasopressors -Sepsis resolved -Blood cultures 5/27 grew MRSA and found to have septic pulmonary emboli -Repeat cultures subsequently positive but have been negative since 6/4  -2-D echo on 5/27 with tricuspid valve vegetations, moderate TR, TEE on 6/1 showed large tricuspid valve vegetation with severe  TR and dilated RV  - Due to persistent bacteremia patient was started on daptomycin 6/2 and Ceftaroline continued to cover lung infection. - cardiothoracic surgery was also consulted, not a candidate for surgery at this time.  - Per ID recommendations, stopped Ceftaroline and daptomycin, use vancomycin alone, duration through July 15, will need BMP, CBC weekly, trough goal of 15-20.  -ID follow-up to be arranged -Child psychotherapist working on SNF placement, may end up staying in hospital to complete antibiotic course   Acute respiratory failure/spontaneous right pneumothorax/MRSA empyema -Followed by CVTS, status post chest tube drainage, improved -Chest tube removed 6/7 -Stable now and repeat chest x-rays clear  Acute kidney injury -Secondary to NSAIDs and dehydration -Resolved  Ascites, anasarca due to right sided hear failure - as a result of severe TR, net positive balance of 13.7 L - limit IVF, hold off on administering diuretics in setting of sepsis and borderline BPs, O2 sats 96% on room air -By mouth Lasix 20 mg daily  Splenomegaly - etiology undetermined- CT on 5/30 revealed that the spleen is enlarged measuring 15.7 x 5.6 x 17 cm (volume= 780 cm^3) - liver is unremarkable on CT -Needs follow-up   Normocytic Anemia - H&H currently stable, was transfused on 6/5, no overt bleeding noted - Iron was 12. Ferritin 213. B-12 1850. Folate 7.8.  Hypokalemia/Hypophosphatemia/Hypomagnesemia - Resolved  Thrombocytopenia - Resolved  Heroin abuse - cont Suboxone- will need transition to a drug rehab program once stable.   Anxiety - cont Buspar, Neurontin, Xanax  Nicotine dependence -Continue  Nicoderm patch   Code Status: Full CODE STATUS  DVT Prophylaxis:  heparin  Family Communication: Mother at bedside Disposition Plan: SNF that will accept patient under current circumstances versus prolonged inpatient stay until completion of Abx  Time Spent in minutes  25  minutes  Procedures:  TEE  - Left ventricle: The cavity size was normal. Wall thickness was normal. Systolic function was normal. The estimated ejection fraction was in the range of 55% to 60%. Wall motion was normal; there were no regional wall motion abnormalities. - Left atrium: No evidence of thrombus in the atrial cavity or appendage. - Right ventricle: The cavity size was mildly dilated. Wall thickness was normal. - Right atrium: No evidence of thrombus in the atrial cavity or appendage. - Tricuspid valve: Flail motion of the posterior leaflet. There is a 8x5 mm vegetation attached to the posterior leaflet, but most of the &quot;mass&quot; described on transthoracic imaging appears to be the flail segment of the valve. There was severe regurgitation directed eccentrically and toward the free wall.  Right chest tube placement for pneumothorax 6/1  Placement of a larger bore chest tube in the right chest 6/3  Consultants:   Infectious disease Cardiothoracic surgery  Antimicrobials:   Daptomycin 6/2> 6/8  Teflaro 5/31> 6/8  Vancomycin  6/8>> will need to July 15   Medications  Scheduled Meds: . acetaminophen  1,000 mg Oral Q6H  . buprenorphine-naloxone  2 tablet Sublingual Daily  . busPIRone  10 mg Oral BID  . gabapentin  300 mg Oral TID  . heparin  5,000 Units Subcutaneous Q8H  . nicotine  14 mg Transdermal Daily  . saccharomyces boulardii  250 mg Oral BID   Continuous Infusions: . sodium chloride 250 mL (11/28/16 2032)  . vancomycin 750 mg (11/29/16 1013)   PRN Meds:.sodium chloride, albuterol, ALPRAZolam, docusate sodium, ibuprofen, nicotine polacrilex, ondansetron (ZOFRAN) IV, oxyCODONE, sodium chloride flush   Antibiotics   Anti-infectives    Start     Dose/Rate Route Frequency Ordered Stop   11/29/16 1000  vancomycin (VANCOCIN) IVPB 750 mg/150 ml premix     750 mg 150 mL/hr over 60 Minutes Intravenous Every 12 hours 11/29/16  0934     11/26/16 2000  vancomycin (VANCOCIN) IVPB 1000 mg/200 mL premix  Status:  Discontinued     1,000 mg 200 mL/hr over 60 Minutes Intravenous Every 12 hours 11/26/16 1912 11/29/16 0853   11/24/16 0000  vancomycin (VANCOCIN) IVPB 1000 mg/200 mL premix  Status:  Discontinued     1,000 mg 200 mL/hr over 60 Minutes Intravenous Every 8 hours 11/23/16 1238 11/26/16 0721   11/23/16 1600  vancomycin (VANCOCIN) 1,500 mg in sodium chloride 0.9 % 500 mL IVPB     1,500 mg 250 mL/hr over 120 Minutes Intravenous  Once 11/23/16 1238 11/23/16 1733   11/17/16 1600  DAPTOmycin (CUBICIN) 500 mg in sodium chloride 0.9 % IVPB  Status:  Discontinued     500 mg 220 mL/hr over 30 Minutes Intravenous Every 24 hours 11/17/16 1432 11/23/16 1238   11/17/16 1500  ceftaroline (TEFLARO) 600 mg in sodium chloride 0.9 % 250 mL IVPB  Status:  Discontinued     600 mg 250 mL/hr over 60 Minutes Intravenous Every 12 hours 11/17/16 1435 11/23/16 1238   11/15/16 1800  ceftaroline (TEFLARO) 600 mg in sodium chloride 0.9 % 250 mL IVPB  Status:  Discontinued     600 mg 250 mL/hr over 60 Minutes Intravenous Every 12 hours 11/15/16 1635 11/17/16 1431   11/13/16 1215  vancomycin (VANCOCIN) IVPB 1000 mg/200 mL premix  Status:  Discontinued     1,000 mg 200 mL/hr over 60 Minutes Intravenous Every 8 hours 11/13/16 1203 11/16/16 0850   11/11/16 1100  vancomycin (VANCOCIN) IVPB 750 mg/150 ml premix  Status:  Discontinued     750 mg 150 mL/hr over 60 Minutes Intravenous Every 8 hours 11/11/16 0956 11/13/16 1203   11/11/16 0400  vancomycin (VANCOCIN) 500 mg in sodium chloride 0.9 % 100 mL IVPB  Status:  Discontinued     500 mg 100 mL/hr over 60 Minutes Intravenous Every 12 hours 11/10/16 1951 11/11/16 0956   11/10/16 2200  ceFEPIme (MAXIPIME) 1 g in dextrose 5 % 50 mL IVPB  Status:  Discontinued     1 g 100 mL/hr over 30 Minutes Intravenous Every 12 hours 11/10/16 1951 11/11/16 0926   11/10/16 2000  ceFEPIme (MAXIPIME) 2 g in  dextrose 5 % 50 mL IVPB  Status:  Discontinued     2 g 100 mL/hr over 30 Minutes Intravenous  Once 11/10/16 1952 11/10/16 1954   11/10/16 2000  vancomycin (VANCOCIN) IVPB 1000 mg/200 mL premix  Status:  Discontinued     1,000 mg 200 mL/hr over 60 Minutes Intravenous  Once 11/10/16 1952 11/10/16  1954   11/10/16 1430  piperacillin-tazobactam (ZOSYN) IVPB 3.375 g     3.375 g 100 mL/hr over 30 Minutes Intravenous  Once 11/10/16 1426 11/10/16 1503   11/10/16 1430  vancomycin (VANCOCIN) IVPB 1000 mg/200 mL premix     1,000 mg 200 mL/hr over 60 Minutes Intravenous  Once 11/10/16 1426 11/10/16 1653        Subjective:  Feels better, swelling improving, denies chest pain or shortness of breath  Objective:   Vitals:   11/28/16 0541 11/28/16 1458 11/28/16 2150 11/29/16 0530  BP: 136/81 (!) 134/95 118/85 130/83  Pulse: (!) 57 (!) 59 65 69  Resp: 18 20 18 18   Temp: 98.4 F (36.9 C) 98.5 F (36.9 C)  98.5 F (36.9 C)  TempSrc:  Oral  Oral  SpO2: 98% 97% 95% 95%  Weight: 63.5 kg (140 lb 1.6 oz)   60.9 kg (134 lb 3.2 oz)  Height:        Intake/Output Summary (Last 24 hours) at 11/29/16 1102 Last data filed at 11/29/16 0600  Gross per 24 hour  Intake           325.67 ml  Output                0 ml  Net           325.67 ml     Wt Readings from Last 3 Encounters:  11/29/16 60.9 kg (134 lb 3.2 oz)  03/14/16 58.5 kg (129 lb)  01/19/16 58.1 kg (128 lb)     Exam  Gen: Awake, Alert, Oriented X 3,  HEENT: PERRLA, Neck supple, no JVD Lungs: Good air movement bilaterally, CTAB CVS: RRR,Grade 2 systolic murmur Abd: soft, Non tender, non distended, BS present Extremities: Trace edema, improving  Data Reviewed:  I have personally reviewed following labs and imaging studies  Micro Results No results found for this or any previous visit (from the past 240 hour(s)).  Radiology Reports Dg Chest 2 View  Result Date: 11/23/2016 CLINICAL DATA:  Chest tube removal EXAM: CHEST  2 VIEW  COMPARISON:  11/21/2016 FINDINGS: Interval removal of right chest tube with residual small right pleural effusion versus pleural thickening. No pneumothorax. Patchy bilateral airspace opacities are again noted, unchanged. Heart is borderline in size. IMPRESSION: Interval removal of right chest tube without pneumothorax. Otherwise no change. Electronically Signed   By: Charlett Nose M.D.   On: 11/23/2016 08:44   Dg Chest 2 View  Result Date: 11/21/2016 CLINICAL DATA:  Chest soreness.  Chest tube . EXAM: CHEST  2 VIEW COMPARISON:  CT 11/20/2016.  Chest x-ray 11/20/2016 FINDINGS: Chest tube noted on the right. Tiny right anterior hydropneumothorax again noted. No interim change . Stable cardiomegaly. Persistent bilateral pulmonary nodules including cavitary nodules. Reference is made to prior CT report. Small right pleural effusion. No pneumothorax. IMPRESSION: 1. Right chest tube in stable position. Tiny right anterior hydropneumothorax again noted. No interim change. 2. Multiple bilateral pulmonary nodules with cavitation. Reference is made to prior CT report of 11/20/2016 . Electronically Signed   By: Maisie Fus  Register   On: 11/21/2016 11:15   Dg Chest 2 View  Result Date: 11/19/2016 CLINICAL DATA:  Pneumothorax. EXAM: CHEST  2 VIEW COMPARISON:  11/19/2016. FINDINGS: Right chest tube in stable position. No significant pneumothorax noted on today's exam. Small right pleural effusion. Bilateral patchy pulmonary infiltrates with cavitation again noted. Small left pleural effusion noted. Heart size stable. No acute bony abnormality . IMPRESSION: 1. Right  chest tube in stable position. No pneumothorax noted on today's exam. Small right pleural effusion. 2. Persistent bilateral cavitary pulmonary infiltrates. Small left pleural effusion. Electronically Signed   By: Maisie Fus  Register   On: 11/19/2016 16:53   Dg Chest 2 View  Result Date: 11/10/2016 CLINICAL DATA:  Chest pain, shortness of breath for 1 week EXAM:  CHEST  2 VIEW COMPARISON:  CT chest 03/12/2016, chest x-ray 03/12/2016 FINDINGS: There are bilateral nodular airspace opacities throughout the upper and lower lobes bilaterally. There is no pleural effusion or pneumothorax. The heart and mediastinal contours are unremarkable. The osseous structures are unremarkable. IMPRESSION: New bilateral nodular airspace opacities throughout the upper and lower lobes bilaterally. Findings are concerning for multifocal infection such as septic emboli. Recommend further evaluation with a CT of the chest with intravenous contrast. Electronically Signed   By: Elige Ko   On: 11/10/2016 13:23   Ct Chest Wo Contrast  Result Date: 11/20/2016 CLINICAL DATA:  Follow-up right pleural effusion EXAM: CT CHEST WITHOUT CONTRAST TECHNIQUE: Multidetector CT imaging of the chest was performed following the standard protocol without IV contrast. COMPARISON:  Radiograph 11/20/2016, CT chest 11/10/2016, prior radiographs dating back to 11/10/2016 FINDINGS: Cardiovascular: Limited assessment without intravenous contrast. Small fluid in the superior pericardial recess. There is cardiomegaly. Mediastinum/Nodes: Difficult evaluation without contrast. Mediastinal adenopathy is present right paratracheal soft tissue fullness, possible adenopathy does not appear significantly changed. Midline trachea. Esophagus grossly unremarkable. Lungs/Pleura: Interim placement of a right lower lateral chest tube since the prior CT with the tip terminating along the right upper posterior pleural surface. Small bilateral pleural effusions right greater than left, slightly increased compared to the previous CT. Tiny right anterior hydropneumothorax. Multiple bilateral pulmonary nodules and cavitary lesions. Some of the cavitary lesions have decreased, however if there appears to be increased nodules/disease most notable in the left upper lobe and the right lower lobe. Decreased septal thickening compared to the  previous exam. Upper Abdomen: Spleen appears enlarged. Partially visualized surgical clips in the gallbladder fossa. Musculoskeletal: No acute or suspicious bone lesion. IMPRESSION: 1. Interim placement of right-sided chest tube. Small residual pleural effusions right greater than left, mildly increased compared to the prior chest CT. Small right anterior hydropneumothorax. 2. Innumerable bilateral lung nodules and cavitary lesions, suspected to represent septic emboli. This appears increased in the left upper and right lower lobe since the prior CT. 3. Suspect mediastinal adenopathy although evaluation of the mediastinum is limited without contrast 4. Splenomegaly Electronically Signed   By: Jasmine Pang M.D.   On: 11/20/2016 21:25   Ct Chest Wo Contrast  Result Date: 11/10/2016 CLINICAL DATA:  She c/o some chest discomfrot and shortness of breath x 1 week. She states these are similar s/s she had a year ago, at which time she was dx with "pulmonary abscess". She is in no distress. IV drug user EXAM: CT CHEST WITHOUT CONTRAST TECHNIQUE: Multidetector CT imaging of the chest was performed following the standard protocol without IV contrast. COMPARISON:  Chest CT angiogram dated 03/12/2016. FINDINGS: Cardiovascular: Probable small pericardial effusion and/or pericardial thickening, difficult to characterize without IV contrast. Heart size is within normal limits. Thoracic aorta appears to be normal in caliber. Mediastinum/Nodes: Soft tissue density material within the right upper peritracheal space, also difficult to characterize without IV contrast, presumed lymphadenopathy. Additional mild lymphadenopathy within the anterior mediastinum and aortopulmonary window region. Lungs/Pleura: Numerous cavitary consolidations throughout both lungs, peripherally distributed suggesting septic emboli. More confluent consolidations at each lung base,  more likely atelectasis than pneumonia. Small right pleural effusion.  Upper Abdomen: Suspected splenomegaly, incompletely imaged. Musculoskeletal: No acute or suspicious osseous finding. IMPRESSION: 1. Numerous cavitary consolidations throughout both lungs, with a peripheral distribution suggesting septic emboli. Largest cavitary consolidation is within the left lower lobe measuring approximately 4 cm greatest dimension. 2. Probable small pericardial effusion and/or pericardial wall thickening, difficult to delineate without IV contrast. 3. Probable mediastinal lymphadenopathy, again difficult to definitively characterize without IV contrast, alternatively confluent fluid or edema within the mediastinum. 4. Small right pleural effusion. 5. Probable splenomegaly, incompletely imaged at the lower aspects of this chest CT. 6. Right IJ line in place with tip passing through the right atrium and into the right ventricle. Recommend retracting to the cavoatrial junction for optimal radiographic positioning. These results and recommendations were called by telephone at the time of interpretation on 11/10/2016 at 7:35 pm to Dr. Katrinka Blazing , who verbally acknowledged these results. Electronically Signed   By: Bary Richard M.D.   On: 11/10/2016 19:36   Ct Abdomen Pelvis W Contrast  Result Date: 11/14/2016 CLINICAL DATA:  Right upper quadrant pain times 2-3 days. Polysubstance abuse. Cholecystectomy. History of endocarditis and septic emboli. EXAM: CT ABDOMEN AND PELVIS WITH CONTRAST TECHNIQUE: Multidetector CT imaging of the abdomen and pelvis was performed using the standard protocol following bolus administration of intravenous contrast. CONTRAST:  80mL ISOVUE-300 IOPAMIDOL (ISOVUE-300) INJECTION 61% COMPARISON:  03/12/2016 chest CT FINDINGS: Lower chest: Small right effusion with several bilateral cavitary opacities in both lower lobes concerning for septic emboli or cavitary pneumonia. More confluent airspace opacity in the left lower lobe is also noted. The visualized heart is normal in  size. There is no pericardial effusion. No large central pulmonary embolus. Hepatobiliary: Normal appearance of the liver without space-occupying mass or biliary dilatation. Cholecystectomy. Pancreas: No pancreatic mass or ductal dilatation. Mild enlargement of the pancreatic head with surrounding peripancreatic fluid is noted. Correlate to exclude a pancreatitis. Spleen: The spleen is enlarged measuring 15.7 x 5.6 x 17 cm (volume = 780 cm^3) and demonstrates a developmental cleft, partially visualized on prior chest CT and therefore believed less likely to represent a laceration. Adrenals/Urinary Tract: Adrenal glands are unremarkable. Kidneys are normal, without renal calculi, solid appearing lesions, or hydronephrosis. There appear to be pair of water attenuating cysts in the lower pole the right kidney measuring up to 2 x 1.3 cm in aggregate. Bladder is unremarkable. Stomach/Bowel: Stomach is within normal limits. Appendix appears normal. No evidence of bowel wall thickening, distention, or inflammatory changes. Vascular/Lymphatic: No significant vascular findings are present. No enlarged abdominal or pelvic lymph nodes. Reproductive: Uterus and bilateral adnexa are unremarkable. Other: Moderate volume of ascites.  Anasarca. Musculoskeletal: No acute or significant osseous findings. IMPRESSION: 1. Bibasilar, multifocal cavitary lesions noted within the visualized lower lobes consistent with septic emboli or possibly cavitary pneumonia. More confluent airspace disease in the left lower lobe without cavitation is also present. There is a small right pleural effusion. 2. Anasarca with moderate volume of ascites noted within the abdomen and pelvis. Question right heart dysfunction/failure. 3. Splenomegaly. 4. Water attenuating cysts in the lower pole the right kidney in aggregate measuring 2 x 1.3 cm. 5. Status post cholecystectomy. Electronically Signed   By: Tollie Eth M.D.   On: 11/14/2016 16:26   Dg Chest 1v  Repeat Same Day  Result Date: 11/18/2016 CLINICAL DATA:  Chest tube placement EXAM: CHEST - 1 VIEW SAME DAY COMPARISON:  Earlier same day FINDINGS: Large bore  chest tube placed on the right along the lateral margin. Marked reduction an amount of right pleural air, nearly completely evacuated. Patchy infiltrates persist in both lower lobes. IMPRESSION: Large bore chest tube placed. Near complete resolution of right pneumothorax. Electronically Signed   By: Paulina Fusi M.D.   On: 11/18/2016 11:40   Dg Chest Port 1 View  Result Date: 11/20/2016 CLINICAL DATA:  26 year old female with pneumothorax. Recent transesophageal echocardiogram. Subsequent encounter. EXAM: PORTABLE CHEST 1 VIEW COMPARISON:  11/20/2016 8:30 a.m. FINDINGS: Right-sided chest tube remains in place. No pneumothorax detected on this frontal projection. Cavitary lesions bilaterally. Left base consolidation. Atelectasis/pleural thickening right lung base. Cardiomegaly. IMPRESSION: No significant change since exam earlier today. Electronically Signed   By: Lacy Duverney M.D.   On: 11/20/2016 13:55   Dg Chest Port 1 View  Result Date: 11/20/2016 CLINICAL DATA:  Chest tube, shortness of Breath EXAM: PORTABLE CHEST 1 VIEW COMPARISON:  11/19/2016 FINDINGS: Right chest tube remains in place, unchanged. No pneumothorax. Patchy bilateral airspace opacities are again noted, some with cavitation. Small right pleural effusion. Heart is borderline in size. IMPRESSION: Right chest tube remains in place without pneumothorax. Stable patchy bilateral airspace disease, some of which is cavitary. Electronically Signed   By: Charlett Nose M.D.   On: 11/20/2016 08:42   Dg Chest Port 1 View  Result Date: 11/19/2016 CLINICAL DATA:  26 year old female with cavitary lung lesions possibly from septic endocarditis. Subsequent encounter. EXAM: PORTABLE CHEST 1 VIEW COMPARISON:  11/18/2016. FINDINGS: Right-sided chest tube remains in place. Tiny hydropneumothorax versus  fluid within minor fissure noted. Multiple cavitary lesions. Right-sided pleural effusion. Basilar atelectasis versus infiltrate greater on the left. Cardiomegaly. Mild scoliosis. IMPRESSION: Right-sided chest tube remains in place. Tiny hydropneumothorax versus fluid within minor fissure. Multiple cavitary lesions. Right-sided pleural effusion. Basilar atelectasis versus infiltrate greater on the left. Electronically Signed   By: Lacy Duverney M.D.   On: 11/19/2016 07:16   Dg Chest Port 1 View  Result Date: 11/18/2016 CLINICAL DATA:  Followup left pneumothorax. Patient with bacterial endocarditis. EXAM: PORTABLE CHEST 1 VIEW COMPARISON:  11/17/2016 and prior studies FINDINGS: A moderate right hydropneumothorax is unchanged in size, but now with pleural fluid in its basilar portion. A right thoracostomy tube is unchanged. Scattered pulmonary opacities/ nodules and left lower lung consolidations/atelectasis again noted. There has been no other interval change. IMPRESSION: Moderate right hydropneumothorax, unchanged in size but now with pleural fluid in its basilar portion. Right thoracostomy tube remains unchanged. Electronically Signed   By: Harmon Pier M.D.   On: 11/18/2016 10:14   Dg Chest Port 1 View  Result Date: 11/17/2016 CLINICAL DATA:  Follow-up right-sided chest tube placement. Initial encounter. EXAM: PORTABLE CHEST 1 VIEW COMPARISON:  Chest radiograph performed 11/16/2016 FINDINGS: The patient's moderate right-sided pneumothorax has decreased in size. The right-sided chest tube is grossly unchanged in appearance. Vascular congestion is noted. Patchy left-sided airspace opacities raise concern for pneumonia. Right-sided airspace opacity may reflect atelectasis. A small left pleural effusion is noted. The cardiomediastinal silhouette is mildly enlarged. No acute osseous abnormalities are identified. IMPRESSION: 1. Moderate right-sided pneumothorax has decreased in size. Right-sided chest tube is  grossly unchanged in appearance. 2. Vascular congestion and mild cardiomegaly. Patchy left-sided airspace opacities raise concern for pneumonia. Small left pleural effusion noted. 3. Right-sided airspace opacity may reflect atelectasis. Electronically Signed   By: Roanna Raider M.D.   On: 11/17/2016 00:33   Dg Chest Port 1 View  Result Date: 11/16/2016 CLINICAL  DATA:  Chest tube placement for right pneumothorax. EXAM: PORTABLE CHEST 1 VIEW COMPARISON:  Single-view of the chest earlier today and 11/11/2016. FINDINGS: Pigtail catheter is now in place in the right chest. Large right pneumothorax is unchanged. Patchy airspace disease in cavitary lesions in the left chest persist. Heart size is normal. IMPRESSION: No change in large right pneumothorax after chest tube placement. No change in patchy airspace disease in cavitary lesions on the left. These results were called by telephone at the time of interpretation on 11/16/2016 at 8:10 pm to Estrella Deeds, RN, who verbally acknowledged these results. Electronically Signed   By: Drusilla Kanner M.D.   On: 11/16/2016 20:12   Dg Chest Port 1 View  Result Date: 11/16/2016 CLINICAL DATA:  Shortness of Breath EXAM: PORTABLE CHEST 1 VIEW COMPARISON:  11/11/2016 FINDINGS: Cardiac shadow is within normal limits. Cavitary nodular densities are noted throughout both lungs similar to that seen on previous CT examination. A new right-sided moderately large pneumothorax is noted with consolidation of the lower lobe on the right. No acute bony abnormality is noted. IMPRESSION: Moderately large right-sided pneumothorax new from the prior exam. Near complete collapse of the right lower lobe is noted. Stable cavitary lesions within both lungs. Critical Value/emergent results were called by telephone at the time of interpretation on 11/16/2016 at 6:24 pm to Dr Leitha Bleak, who verbally acknowledged these results. Electronically Signed   By: Alcide Clever M.D.   On: 11/16/2016 18:27   Dg  Chest Port 1 View  Result Date: 11/11/2016 CLINICAL DATA:  Check right IJ line placement.  Initial encounter. EXAM: PORTABLE CHEST 1 VIEW COMPARISON:  Chest radiograph performed 11/10/2016 FINDINGS: The right IJ line is noted ending at the right atrium. This could be retracted 2-3 cm. Mild vascular congestion is noted. Mild left-sided opacities may reflect atelectasis or mild pneumonia. No pleural effusion or pneumothorax is seen. The cardiomediastinal silhouette is borderline normal in size. No acute osseous abnormalities are identified. IMPRESSION: 1. Right IJ line noted ending at the right atrium. This could be retracted 2-3 cm, as deemed clinically appropriate. 2. Mild vascular congestion noted. 3. Mild left-sided airspace opacities may reflect atelectasis or mild pneumonia. Electronically Signed   By: Roanna Raider M.D.   On: 11/11/2016 00:37   Dg Chest Port 1 View  Result Date: 11/10/2016 CLINICAL DATA:  Retraction of the IJ line about 10 cm EXAM: PORTABLE CHEST 1 VIEW COMPARISON:  CT chest 11/10/2016.  Chest 11/10/2016. FINDINGS: The left central venous catheter tip localizes to the mid/distal right atrial region. If placement at or above the cavoatrial junction is desired, retraction of about 3-4 cm is recommended. No pneumothorax. Shallow inspiration with atelectasis in the lung bases. Borderline heart size. Patchy airspace infiltrates in both lungs could indicate edema, aspiration, or pneumonia. No blunting of costophrenic angles. IMPRESSION: Central venous catheter tip is in the mid/ low right atrial region. If placement at or above the cavoatrial junction is desired, retraction of about 3-4 cm is recommended. Electronically Signed   By: Burman Nieves M.D.   On: 11/10/2016 21:35   Dg Chest Portable 1 View  Result Date: 11/10/2016 CLINICAL DATA:  Status post central line placement. EXAM: PORTABLE CHEST 1 VIEW COMPARISON:  Chest x-ray from earlier same day. FINDINGS: Interval placement of a  right IJ central line with tip passing to the left of midline and likely in the right ventricle or main pulmonary artery. The bilateral small nodular airspace opacities throughout both lungs are  unchanged in the short-term interval. Suspect small bilateral pleural effusions. No pneumothorax seen. IMPRESSION: 1. Right IJ central line placement with tip positioned to the left of midline either within the right ventricle or main pulmonary artery. Recommend retracting at least 10 cm. 2. Bilateral small nodular airspace opacities throughout both lungs are unchanged in the short-term interval. As recommended on earlier chest x-ray report, would consider chest CT for further characterization. 3. Suspect small bilateral pleural effusions. Electronically Signed   By: Bary Richard M.D.   On: 11/10/2016 18:37    Lab Data:  CBC:  Recent Labs Lab 11/24/16 0606 11/25/16 0445 11/26/16 0612 11/28/16 0405 11/29/16 0409  WBC 5.4 5.6 5.5 6.8 6.6  HGB 7.5* 7.1* 9.5* 9.7* 9.4*  HCT 23.8* 22.7* 29.2* 30.4* 29.4*  MCV 86.5 88.3 87.7 86.6 87.5  PLT 360 332 326 329 317   Basic Metabolic Panel:  Recent Labs Lab 11/24/16 0606 11/25/16 0445 11/26/16 0612 11/27/16 0400 11/28/16 0405 11/29/16 0409  NA 139 138 139 138 138  --   K 3.4* 3.8 3.6 3.3* 3.5  --   CL 107 107 107 104 105  --   CO2 24 26 24 27 26   --   GLUCOSE 89 97 86 94 98  --   BUN 7 10 10 6 8   --   CREATININE 0.79 0.80 0.92  0.95 0.79 0.78 0.71  CALCIUM 7.9* 8.3* 8.3* 8.4* 8.0*  --    GFR: Estimated Creatinine Clearance: 88.2 mL/min (by C-G formula based on SCr of 0.71 mg/dL). Liver Function Tests: No results for input(s): AST, ALT, ALKPHOS, BILITOT, PROT, ALBUMIN in the last 168 hours. No results for input(s): LIPASE, AMYLASE in the last 168 hours. No results for input(s): AMMONIA in the last 168 hours. Coagulation Profile: No results for input(s): INR, PROTIME in the last 168 hours. Cardiac Enzymes: No results for input(s): CKTOTAL,  CKMB, CKMBINDEX, TROPONINI in the last 168 hours. BNP (last 3 results) No results for input(s): PROBNP in the last 8760 hours. HbA1C: No results for input(s): HGBA1C in the last 72 hours. CBG: No results for input(s): GLUCAP in the last 168 hours. Lipid Profile: No results for input(s): CHOL, HDL, LDLCALC, TRIG, CHOLHDL, LDLDIRECT in the last 72 hours. Thyroid Function Tests: No results for input(s): TSH, T4TOTAL, FREET4, T3FREE, THYROIDAB in the last 72 hours. Anemia Panel: No results for input(s): VITAMINB12, FOLATE, FERRITIN, TIBC, IRON, RETICCTPCT in the last 72 hours. Urine analysis:    Component Value Date/Time   COLORURINE YELLOW 11/10/2016 2132   APPEARANCEUR CLEAR 11/10/2016 2132   LABSPEC 1.006 11/10/2016 2132   PHURINE 5.0 11/10/2016 2132   GLUCOSEU NEGATIVE 11/10/2016 2132   HGBUR MODERATE (A) 11/10/2016 2132   BILIRUBINUR NEGATIVE 11/10/2016 2132   KETONESUR NEGATIVE 11/10/2016 2132   PROTEINUR NEGATIVE 11/10/2016 2132   UROBILINOGEN 0.2 08/05/2010 1821   NITRITE NEGATIVE 11/10/2016 2132   LEUKOCYTESUR SMALL (A) 11/10/2016 2132     Pilar Corrales M.D. [facility] 11/29/2016, 11:02 AM  Contact via [email or web address], password [email or web address] Call night coverage person covering after 7pm

## 2016-11-29 NOTE — Progress Notes (Signed)
Patient progress reviewed.  No placement yet and [facility] need [facility] until antibiotic course has completed July 15th.  No reported issues with vancomycin. We will follow up after discharge if placement arranged, otherwise can see her at the end of treatment and will sign off now. thanks

## 2016-11-30 LAB — CBC
HCT: 31.2 % — ABNORMAL LOW (ref 36.0–46.0)
HEMOGLOBIN: 9.9 g/dL — AB (ref 12.0–15.0)
MCH: 27.8 pg (ref 26.0–34.0)
MCHC: 31.7 g/dL (ref 30.0–36.0)
MCV: 87.6 fL (ref 78.0–100.0)
Platelets: 327 10*3/uL (ref 150–400)
RBC: 3.56 MIL/uL — ABNORMAL LOW (ref 3.87–5.11)
RDW: 15.3 % (ref 11.5–15.5)
WBC: 7.6 10*3/uL (ref 4.0–10.5)

## 2016-11-30 LAB — CREATININE, SERUM
Creatinine, Ser: 0.78 mg/dL (ref 0.44–1.00)
GFR calc Af Amer: >60 mL/min (ref >60)
GFR calc non Af Amer: >60 mL/min (ref >60)

## 2016-11-30 NOTE — Progress Notes (Signed)
Pt seen and examined, no changes Awaiting placement -[facility]  Zannie CovePreetha Dalanie Kisner MD

## 2016-12-01 LAB — BASIC METABOLIC PANEL
Anion gap: 6 (ref 5–15)
BUN: 10 mg/dL (ref 6–20)
CALCIUM: 8.5 mg/dL — AB (ref 8.9–10.3)
CO2: 28 mmol/L (ref 22–32)
CREATININE: 0.68 mg/dL (ref 0.44–1.00)
Chloride: 104 mmol/L (ref 101–111)
GFR calc Af Amer: >60 mL/min (ref >60)
GLUCOSE: 95 mg/dL (ref 65–99)
Potassium: 3.4 mmol/L — ABNORMAL LOW (ref 3.5–5.1)
Sodium: 138 mmol/L (ref 135–145)

## 2016-12-01 LAB — VANCOMYCIN, TROUGH: Vancomycin Tr: 17 ug/mL (ref 15–20)

## 2016-12-01 NOTE — Progress Notes (Signed)
Pharmacy Antibiotic Note  Savannah Palmer is a 26 y.o. 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 Pharmacy has been consulted for Vancomycin dosing.  A vancomycin trough drawn this morning was at goal with a value of 17cg/mL.  Plan: Continue Vancomycin to 750mg  IV q12 Repeat vancomycin trough as needed (at least two times weekly while on therapy) Plans for 6 weeks of treatment- through 12/30/2016 Follow dispo planning  Height: 5\' 3"  (160 cm) Weight: 132 lb (59.9 kg) IBW/kg (Calculated) : 52.4  Temp (24hrs), Avg:98.5 F (36.9 C), Min:98.3 F (36.8 C), Max:98.8 F (37.1 C)   Recent Labs Lab 11/25/16 0445  11/26/16 0612 11/26/16 1739 11/27/16 0400 11/28/16 0405 11/29/16 0409 11/29/16 0743 11/30/16 0440 12/01/16 0349 12/01/16 0859  WBC 5.6  --  5.5  --   --  6.8 6.6  --  7.6  --   --   CREATININE 0.80  --  0.92  0.95  --  0.79 0.78 0.71  --  0.78 0.68  --   VANCOTROUGH  --   < > 38*  --   --   --   --  23*  --   --  17  VANCORANDOM  --   --   --  19  --   --   --   --   --   --   --   < > = values in this interval not displayed.  Estimated Creatinine Clearance: 88.2 mL/min (by C-G formula based on SCr of 0.68 mg/dL).    No Known Allergies  5/26 Zosyn x 1 5/26 Vanc >> 6/1;   6/8 >> 5/26 Cefepime >> 5/27 5/31 Teflaro >> 6/8 6/2 Daptomycin >> 6/8   5/27: increase vanc to 750 q8 with improved renal function 5/29 1049 VT: 13 (subtherapeutic) on 750mg  q8h prior to 7th dose, inc to 1g q8h 5/31 2030 VT: 19 (therapeutic) - continue 1g q8h 6/11 0600 VT: 38 (accumulated on q8h dose)  VANC HOLD 6/11 VR 19 (t1/2 ~11.5 > reduced to 1000mg  q12) 6/14 VT 23 on 1g q12h > reduced to 750mg  q12h 6/16 VT 17 on 750mg  q12h > no changes  5/26 UCx: NGF 5/26 BCx: 4/4 MRSA 5/26 MRSA PCR: positive  5/27 BCx: 2/2 MRSA  5/28 BCx: 2/2 MRSA 5/28 HIV antibody: non reactive 5/28 HCV RNA: ND 5/29 BCx: MRSA, BCID = MRSA 5/30 @  0033 BCx: 1/2 MRSA 5/30 @ 1610 BCx: ngF 6/1 pleural fluid Cx: MRSA 6/4 BCx: ngF    Thank you for allowing pharmacy to be a part of this patient's care.  Arletta Lumadue D. Michiel Sivley, PharmD, BCPS Clinical Pharmacist Pager: [phone] Clinical Phone for 12/01/2016 until 3:30pm: [phone] If after 3:30pm, please call main pharmacy at [phone] 12/01/2016 11:31 AM

## 2016-12-01 NOTE — Progress Notes (Signed)
[facility]                                                                              Patient Demographics  Loren Vicens, is a 26 y.o. female, DOB - [birth year], ZOX:[id number]  Admit date - 11/10/2016   Admitting Physician Coralyn Helling, MD  Outpatient Primary MD for the patient is Patient, No Pcp Per  Outpatient specialists:   LOS - 21  days   Medical records reviewed and are as summarized below:    No chief complaint on file.      Brief summary    26 year old Caucasian female with a past medical history of anxiety, depression, heroin abuse, presented with weakness, cough and chest pain. Initially was in septic shock and required pressors. Blood cultures grew MRSA. 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 She was placed on IV antibiotics. She also developed a spontaneous right-sided pneumothorax. Chest tube was placed by critical care medicine. She was transferred to [facility] for opinion from cardiothoracic surgery. Patient is not a candidate for surgery at this time.   Assessment & Plan    Principal Problem: MRSA bacteremia/Septic shock with right TV endocarditis and septic emboli to lungs/MRSA empyema - Patient presented with weakness, coughing, chest pain and septic shock requiring vasopressors. Blood cultures 5/27 grew out MRSA and was found to have septic emboli throughout her lungs.  -Repeat cultures were still positive, blood cultures repeat on 6/4 has been negative to date  - 2-D echo on 5/27 showed tricuspid vegetations with moderate TR. TEE on 6/1 showed large tricuspid vegetation, severe TR with dilated RV along with a loculated left effusion - HIV, Hep C negative. Due to family request, RPR was done and it was negative. - Due to persistent bacteremia patient was started on daptomycin 6/2 and Ceftaroline was continued to cover lung infection. Per ID  recommendations, Ceftaroline and daptomycin have been now discontinued. - cardiothoracic surgery was also consulted and patient was deemed not a candidate for surgery at this time.  - on vancomycin duration through July 15, will need BMP, CBC weekly, trough goal of 15-20.  - PICC line placed on 6/9, continue vancomycin, until July 15. Vanco trough in goal 19 -  Pending skilled nursing facility placement   Active problems: Right spontaneous pneumothorax- acute respiratory failure/MRSA Empyema -Right empyema with pneumothorax, positive for MRSA. Status post chest tube. - Chest tube removed on 6/7, chest x-ray clear   Acute kidney injury -Baseline creatinine 0.7, creatinine increased to 1.37, likely secondary to dehydration and NSAIDs. - patient received IV fluids, NSAIDs were discontinued, and creatinine function has normalized.   RUQ pain, likely musculoskeletal -CT abdomen and pelvis showed no liver or gallbladder abnormalities, possible musculoskeletal origin, currently stable.    Ascites, anasarca due to right sided hear failure - as a result of severe TR, net positive balance of 12L - O2 sats 96% on room air  Splenomegaly - etiology undetermined- CT on 5/30 revealed that the spleen is enlarged measuring 15.7 x 5.6 x 17 cm (volume= 780 cm^3) - liver  is unremarkable on CT  Normocytic Anemia - H&H currently stable, was transfused on 6/5, no overt bleeding noted - Iron was 12. Ferritin 213. B-12 1850. Folate 7.8. - Hemoglobin down to 7.1 on 6/10, patient was transfused 2 units packed RBC with 1 dose of IV Feraheme, currently stable  Hypokalemia/Hypophosphatemia/Hypomagnesemia - Potassium 3.3, replaced  Thrombocytopenia - Resolved  Heroin abuse - cont Suboxone, will need transition to a drug rehab program once stable.  Anxiety - Celexa discontinued per patient's request. Buspar restarted again today per patient's request.  - continue Neurontin and Xanax as needed at  this time.   Nicotine dependence -Continue  Nicoderm patch   Code Status: Full CODE STATUS  DVT Prophylaxis:  heparin  Family Communication: Discussed in detail with the patient, all imaging results, lab results explained to the patient and patient's mother at the bedside. Requested patient's mother not to video record any staff or providers.    Disposition Plan: Hopefully DC to skilled nursing facility when a bed is available. Disposition difficulty due to patient being on suboxone and history of IVDA   Time Spent in minutes  25 minutes  Procedures:  TEE  - Left ventricle: The cavity size was normal. Wall thickness was normal. Systolic function was normal. The estimated ejection fraction was in the range of 55% to 60%. Wall motion was normal; there were no regional wall motion abnormalities. - Left atrium: No evidence of thrombus in the atrial cavity or appendage. - Right ventricle: The cavity size was mildly dilated. Wall thickness was normal. - Right atrium: No evidence of thrombus in the atrial cavity or appendage. - Tricuspid valve: Flail motion of the posterior leaflet. There is a 8x5 mm vegetation attached to the posterior leaflet, but most of the &quot;mass&quot; described on transthoracic imaging appears to be the flail segment of the valve. There was severe regurgitation directed eccentrically and toward the free wall.  Right chest tube placement for pneumothorax 6/1  Placement of a larger bore chest tube in the right chest 6/3  PICC line placed on 6/9  Consultants:   Infectious disease Cardiothoracic surgery  Antimicrobials:   Daptomycin 6/2> 6/8  Teflaro 5/31> 6/8  Vancomycin 6/8>> will need till July 15   Medications  Scheduled Meds: . acetaminophen  1,000 mg Oral Q6H  . buprenorphine-naloxone  2 tablet Sublingual Daily  . busPIRone  10 mg Oral BID  . gabapentin  300 mg Oral TID  . heparin  5,000 Units Subcutaneous Q8H    . nicotine  14 mg Transdermal Daily  . saccharomyces boulardii  250 mg Oral BID   Continuous Infusions: . sodium chloride 250 mL (11/28/16 2032)  . vancomycin 750 mg (12/01/16 1105)   PRN Meds:.sodium chloride, albuterol, ALPRAZolam, docusate sodium, ibuprofen, nicotine polacrilex, ondansetron (ZOFRAN) IV, oxyCODONE, sodium chloride flush   Antibiotics   Anti-infectives    Start     Dose/Rate Route Frequency Ordered Stop   11/29/16 1000  vancomycin (VANCOCIN) IVPB 750 mg/150 ml premix     750 mg 150 mL/hr over 60 Minutes Intravenous Every 12 hours 11/29/16 0934     11/26/16 2000  vancomycin (VANCOCIN) IVPB 1000 mg/200 mL premix  Status:  Discontinued     1,000 mg 200 mL/hr over 60 Minutes Intravenous Every 12 hours 11/26/16 1912 11/29/16 0853   11/24/16 0000  vancomycin (VANCOCIN) IVPB 1000 mg/200 mL premix  Status:  Discontinued     1,000 mg 200 mL/hr over 60 Minutes Intravenous Every 8 hours 11/23/16  1238 11/26/16 0721   11/23/16 1600  vancomycin (VANCOCIN) 1,500 mg in sodium chloride 0.9 % 500 mL IVPB     1,500 mg 250 mL/hr over 120 Minutes Intravenous  Once 11/23/16 1238 11/23/16 1733   11/17/16 1600  DAPTOmycin (CUBICIN) 500 mg in sodium chloride 0.9 % IVPB  Status:  Discontinued     500 mg 220 mL/hr over 30 Minutes Intravenous Every 24 hours 11/17/16 1432 11/23/16 1238   11/17/16 1500  ceftaroline (TEFLARO) 600 mg in sodium chloride 0.9 % 250 mL IVPB  Status:  Discontinued     600 mg 250 mL/hr over 60 Minutes Intravenous Every 12 hours 11/17/16 1435 11/23/16 1238   11/15/16 1800  ceftaroline (TEFLARO) 600 mg in sodium chloride 0.9 % 250 mL IVPB  Status:  Discontinued     600 mg 250 mL/hr over 60 Minutes Intravenous Every 12 hours 11/15/16 1635 11/17/16 1431   11/13/16 1215  vancomycin (VANCOCIN) IVPB 1000 mg/200 mL premix  Status:  Discontinued     1,000 mg 200 mL/hr over 60 Minutes Intravenous Every 8 hours 11/13/16 1203 11/16/16 0850   11/11/16 1100  vancomycin  (VANCOCIN) IVPB 750 mg/150 ml premix  Status:  Discontinued     750 mg 150 mL/hr over 60 Minutes Intravenous Every 8 hours 11/11/16 0956 11/13/16 1203   11/11/16 0400  vancomycin (VANCOCIN) 500 mg in sodium chloride 0.9 % 100 mL IVPB  Status:  Discontinued     500 mg 100 mL/hr over 60 Minutes Intravenous Every 12 hours 11/10/16 1951 11/11/16 0956   11/10/16 2200  ceFEPIme (MAXIPIME) 1 g in dextrose 5 % 50 mL IVPB  Status:  Discontinued     1 g 100 mL/hr over 30 Minutes Intravenous Every 12 hours 11/10/16 1951 11/11/16 0926   11/10/16 2000  ceFEPIme (MAXIPIME) 2 g in dextrose 5 % 50 mL IVPB  Status:  Discontinued     2 g 100 mL/hr over 30 Minutes Intravenous  Once 11/10/16 1952 11/10/16 1954   11/10/16 2000  vancomycin (VANCOCIN) IVPB 1000 mg/200 mL premix  Status:  Discontinued     1,000 mg 200 mL/hr over 60 Minutes Intravenous  Once 11/10/16 1952 11/10/16 1954   11/10/16 1430  piperacillin-tazobactam (ZOSYN) IVPB 3.375 g     3.375 g 100 mL/hr over 30 Minutes Intravenous  Once 11/10/16 1426 11/10/16 1503   11/10/16 1430  vancomycin (VANCOCIN) IVPB 1000 mg/200 mL premix     1,000 mg 200 mL/hr over 60 Minutes Intravenous  Once 11/10/16 1426 11/10/16 1653        Subjective:   Adria Devon was seen and examined today. No complaints except feeling more anxious today. Wants to restart buspar back. No fevers or chills. Patient denies dizziness,  shortness of breath, abdominal pain, N/V/D/C, new weakness, numbess, tingling. No acute events overnight.    Objective:   Vitals:   11/30/16 1626 11/30/16 2256 12/01/16 0500 12/01/16 0703  BP: 122/75 123/78  (!) 139/91  Pulse: 66 69  65  Resp: 20 17  17   Temp: 98.5 F (36.9 C) 98.3 F (36.8 C)  98.8 F (37.1 C)  TempSrc:      SpO2: 97% 98%  95%  Weight:   59.9 kg (132 lb)   Height:        Intake/Output Summary (Last 24 hours) at 12/01/16 1418 Last data filed at 12/01/16 1401  Gross per 24 hour  Intake  480 ml    Output                0 ml  Net              480 ml     Wt Readings from Last 3 Encounters:  12/01/16 59.9 kg (132 lb)  03/14/16 58.5 kg (129 lb)  01/19/16 58.1 kg (128 lb)     Exam  General: Alert and oriented x 3, NAD  Eyes: EOMI PERRLA  HEENT: Normocephalic atraumatic  Cardiovascular: S1 and S2 clear, RRR systolic murmur in the tricuspid area   Respiratory: CTA B  Gastrointestinal: Soft, NT, ND, NBS  Ext: no pedal edema bilaterally  Neuro: no FND  Musculoskeletal: No cyanosis, clubbing  Skin: No rashes  Psych: slightly anxious, alert and oriented 3   Data Reviewed:  I have personally reviewed following labs and imaging studies  Micro Results No results found for this or any previous visit (from the past 240 hour(s)).  Radiology Reports Dg Chest 2 View  Result Date: 11/23/2016 CLINICAL DATA:  Chest tube removal EXAM: CHEST  2 VIEW COMPARISON:  11/21/2016 FINDINGS: Interval removal of right chest tube with residual small right pleural effusion versus pleural thickening. No pneumothorax. Patchy bilateral airspace opacities are again noted, unchanged. Heart is borderline in size. IMPRESSION: Interval removal of right chest tube without pneumothorax. Otherwise no change. Electronically Signed   By: Charlett Nose M.D.   On: 11/23/2016 08:44   Dg Chest 2 View  Result Date: 11/21/2016 CLINICAL DATA:  Chest soreness.  Chest tube . EXAM: CHEST  2 VIEW COMPARISON:  CT 11/20/2016.  Chest x-ray 11/20/2016 FINDINGS: Chest tube noted on the right. Tiny right anterior hydropneumothorax again noted. No interim change . Stable cardiomegaly. Persistent bilateral pulmonary nodules including cavitary nodules. Reference is made to prior CT report. Small right pleural effusion. No pneumothorax. IMPRESSION: 1. Right chest tube in stable position. Tiny right anterior hydropneumothorax again noted. No interim change. 2. Multiple bilateral pulmonary nodules with cavitation. Reference is made  to prior CT report of 11/20/2016 . Electronically Signed   By: Maisie Fus  Register   On: 11/21/2016 11:15   Dg Chest 2 View  Result Date: 11/19/2016 CLINICAL DATA:  Pneumothorax. EXAM: CHEST  2 VIEW COMPARISON:  11/19/2016. FINDINGS: Right chest tube in stable position. No significant pneumothorax noted on today's exam. Small right pleural effusion. Bilateral patchy pulmonary infiltrates with cavitation again noted. Small left pleural effusion noted. Heart size stable. No acute bony abnormality . IMPRESSION: 1. Right chest tube in stable position. No pneumothorax noted on today's exam. Small right pleural effusion. 2. Persistent bilateral cavitary pulmonary infiltrates. Small left pleural effusion. Electronically Signed   By: Maisie Fus  Register   On: 11/19/2016 16:53   Dg Chest 2 View  Result Date: 11/10/2016 CLINICAL DATA:  Chest pain, shortness of breath for 1 week EXAM: CHEST  2 VIEW COMPARISON:  CT chest 03/12/2016, chest x-ray 03/12/2016 FINDINGS: There are bilateral nodular airspace opacities throughout the upper and lower lobes bilaterally. There is no pleural effusion or pneumothorax. The heart and mediastinal contours are unremarkable. The osseous structures are unremarkable. IMPRESSION: New bilateral nodular airspace opacities throughout the upper and lower lobes bilaterally. Findings are concerning for multifocal infection such as septic emboli. Recommend further evaluation with a CT of the chest with intravenous contrast. Electronically Signed   By: Elige Ko   On: 11/10/2016 13:23   Ct Chest Wo Contrast  Result Date: 11/20/2016 CLINICAL  DATA:  Follow-up right pleural effusion EXAM: CT CHEST WITHOUT CONTRAST TECHNIQUE: Multidetector CT imaging of the chest was performed following the standard protocol without IV contrast. COMPARISON:  Radiograph 11/20/2016, CT chest 11/10/2016, prior radiographs dating back to 11/10/2016 FINDINGS: Cardiovascular: Limited assessment without intravenous contrast.  Small fluid in the superior pericardial recess. There is cardiomegaly. Mediastinum/Nodes: Difficult evaluation without contrast. Mediastinal adenopathy is present right paratracheal soft tissue fullness, possible adenopathy does not appear significantly changed. Midline trachea. Esophagus grossly unremarkable. Lungs/Pleura: Interim placement of a right lower lateral chest tube since the prior CT with the tip terminating along the right upper posterior pleural surface. Small bilateral pleural effusions right greater than left, slightly increased compared to the previous CT. Tiny right anterior hydropneumothorax. Multiple bilateral pulmonary nodules and cavitary lesions. Some of the cavitary lesions have decreased, however if there appears to be increased nodules/disease most notable in the left upper lobe and the right lower lobe. Decreased septal thickening compared to the previous exam. Upper Abdomen: Spleen appears enlarged. Partially visualized surgical clips in the gallbladder fossa. Musculoskeletal: No acute or suspicious bone lesion. IMPRESSION: 1. Interim placement of right-sided chest tube. Small residual pleural effusions right greater than left, mildly increased compared to the prior chest CT. Small right anterior hydropneumothorax. 2. Innumerable bilateral lung nodules and cavitary lesions, suspected to represent septic emboli. This appears increased in the left upper and right lower lobe since the prior CT. 3. Suspect mediastinal adenopathy although evaluation of the mediastinum is limited without contrast 4. Splenomegaly Electronically Signed   By: Jasmine Pang M.D.   On: 11/20/2016 21:25   Ct Chest Wo Contrast  Result Date: 11/10/2016 CLINICAL DATA:  She c/o some chest discomfrot and shortness of breath x 1 week. She states these are similar s/s she had a year ago, at which time she was dx with "pulmonary abscess". She is in no distress. IV drug user EXAM: CT CHEST WITHOUT CONTRAST TECHNIQUE:  Multidetector CT imaging of the chest was performed following the standard protocol without IV contrast. COMPARISON:  Chest CT angiogram dated 03/12/2016. FINDINGS: Cardiovascular: Probable small pericardial effusion and/or pericardial thickening, difficult to characterize without IV contrast. Heart size is within normal limits. Thoracic aorta appears to be normal in caliber. Mediastinum/Nodes: Soft tissue density material within the right upper peritracheal space, also difficult to characterize without IV contrast, presumed lymphadenopathy. Additional mild lymphadenopathy within the anterior mediastinum and aortopulmonary window region. Lungs/Pleura: Numerous cavitary consolidations throughout both lungs, peripherally distributed suggesting septic emboli. More confluent consolidations at each lung base, more likely atelectasis than pneumonia. Small right pleural effusion. Upper Abdomen: Suspected splenomegaly, incompletely imaged. Musculoskeletal: No acute or suspicious osseous finding. IMPRESSION: 1. Numerous cavitary consolidations throughout both lungs, with a peripheral distribution suggesting septic emboli. Largest cavitary consolidation is within the left lower lobe measuring approximately 4 cm greatest dimension. 2. Probable small pericardial effusion and/or pericardial wall thickening, difficult to delineate without IV contrast. 3. Probable mediastinal lymphadenopathy, again difficult to definitively characterize without IV contrast, alternatively confluent fluid or edema within the mediastinum. 4. Small right pleural effusion. 5. Probable splenomegaly, incompletely imaged at the lower aspects of this chest CT. 6. Right IJ line in place with tip passing through the right atrium and into the right ventricle. Recommend retracting to the cavoatrial junction for optimal radiographic positioning. These results and recommendations were called by telephone at the time of interpretation on 11/10/2016 at 7:35 pm to  Dr. Katrinka Blazing , who verbally acknowledged these results. Electronically Signed  By: Bary Richard M.D.   On: 11/10/2016 19:36   Ct Abdomen Pelvis W Contrast  Result Date: 11/14/2016 CLINICAL DATA:  Right upper quadrant pain times 2-3 days. Polysubstance abuse. Cholecystectomy. History of endocarditis and septic emboli. EXAM: CT ABDOMEN AND PELVIS WITH CONTRAST TECHNIQUE: Multidetector CT imaging of the abdomen and pelvis was performed using the standard protocol following bolus administration of intravenous contrast. CONTRAST:  80mL ISOVUE-300 IOPAMIDOL (ISOVUE-300) INJECTION 61% COMPARISON:  03/12/2016 chest CT FINDINGS: Lower chest: Small right effusion with several bilateral cavitary opacities in both lower lobes concerning for septic emboli or cavitary pneumonia. More confluent airspace opacity in the left lower lobe is also noted. The visualized heart is normal in size. There is no pericardial effusion. No large central pulmonary embolus. Hepatobiliary: Normal appearance of the liver without space-occupying mass or biliary dilatation. Cholecystectomy. Pancreas: No pancreatic mass or ductal dilatation. Mild enlargement of the pancreatic head with surrounding peripancreatic fluid is noted. Correlate to exclude a pancreatitis. Spleen: The spleen is enlarged measuring 15.7 x 5.6 x 17 cm (volume = 780 cm^3) and demonstrates a developmental cleft, partially visualized on prior chest CT and therefore believed less likely to represent a laceration. Adrenals/Urinary Tract: Adrenal glands are unremarkable. Kidneys are normal, without renal calculi, solid appearing lesions, or hydronephrosis. There appear to be pair of water attenuating cysts in the lower pole the right kidney measuring up to 2 x 1.3 cm in aggregate. Bladder is unremarkable. Stomach/Bowel: Stomach is within normal limits. Appendix appears normal. No evidence of bowel wall thickening, distention, or inflammatory changes. Vascular/Lymphatic: No significant  vascular findings are present. No enlarged abdominal or pelvic lymph nodes. Reproductive: Uterus and bilateral adnexa are unremarkable. Other: Moderate volume of ascites.  Anasarca. Musculoskeletal: No acute or significant osseous findings. IMPRESSION: 1. Bibasilar, multifocal cavitary lesions noted within the visualized lower lobes consistent with septic emboli or possibly cavitary pneumonia. More confluent airspace disease in the left lower lobe without cavitation is also present. There is a small right pleural effusion. 2. Anasarca with moderate volume of ascites noted within the abdomen and pelvis. Question right heart dysfunction/failure. 3. Splenomegaly. 4. Water attenuating cysts in the lower pole the right kidney in aggregate measuring 2 x 1.3 cm. 5. Status post cholecystectomy. Electronically Signed   By: Tollie Eth M.D.   On: 11/14/2016 16:26   Dg Chest 1v Repeat Same Day  Result Date: 11/18/2016 CLINICAL DATA:  Chest tube placement EXAM: CHEST - 1 VIEW SAME DAY COMPARISON:  Earlier same day FINDINGS: Large bore chest tube placed on the right along the lateral margin. Marked reduction an amount of right pleural air, nearly completely evacuated. Patchy infiltrates persist in both lower lobes. IMPRESSION: Large bore chest tube placed. Near complete resolution of right pneumothorax. Electronically Signed   By: Paulina Fusi M.D.   On: 11/18/2016 11:40   Dg Chest Port 1 View  Result Date: 11/20/2016 CLINICAL DATA:  26 year old female with pneumothorax. Recent transesophageal echocardiogram. Subsequent encounter. EXAM: PORTABLE CHEST 1 VIEW COMPARISON:  11/20/2016 8:30 a.m. FINDINGS: Right-sided chest tube remains in place. No pneumothorax detected on this frontal projection. Cavitary lesions bilaterally. Left base consolidation. Atelectasis/pleural thickening right lung base. Cardiomegaly. IMPRESSION: No significant change since exam earlier today. Electronically Signed   By: Lacy Duverney M.D.   On:  11/20/2016 13:55   Dg Chest Port 1 View  Result Date: 11/20/2016 CLINICAL DATA:  Chest tube, shortness of Breath EXAM: PORTABLE CHEST 1 VIEW COMPARISON:  11/19/2016 FINDINGS: Right chest  tube remains in place, unchanged. No pneumothorax. Patchy bilateral airspace opacities are again noted, some with cavitation. Small right pleural effusion. Heart is borderline in size. IMPRESSION: Right chest tube remains in place without pneumothorax. Stable patchy bilateral airspace disease, some of which is cavitary. Electronically Signed   By: Charlett Nose M.D.   On: 11/20/2016 08:42   Dg Chest Port 1 View  Result Date: 11/19/2016 CLINICAL DATA:  26 year old female with cavitary lung lesions possibly from septic endocarditis. Subsequent encounter. EXAM: PORTABLE CHEST 1 VIEW COMPARISON:  11/18/2016. FINDINGS: Right-sided chest tube remains in place. Tiny hydropneumothorax versus fluid within minor fissure noted. Multiple cavitary lesions. Right-sided pleural effusion. Basilar atelectasis versus infiltrate greater on the left. Cardiomegaly. Mild scoliosis. IMPRESSION: Right-sided chest tube remains in place. Tiny hydropneumothorax versus fluid within minor fissure. Multiple cavitary lesions. Right-sided pleural effusion. Basilar atelectasis versus infiltrate greater on the left. Electronically Signed   By: Lacy Duverney M.D.   On: 11/19/2016 07:16   Dg Chest Port 1 View  Result Date: 11/18/2016 CLINICAL DATA:  Followup left pneumothorax. Patient with bacterial endocarditis. EXAM: PORTABLE CHEST 1 VIEW COMPARISON:  11/17/2016 and prior studies FINDINGS: A moderate right hydropneumothorax is unchanged in size, but now with pleural fluid in its basilar portion. A right thoracostomy tube is unchanged. Scattered pulmonary opacities/ nodules and left lower lung consolidations/atelectasis again noted. There has been no other interval change. IMPRESSION: Moderate right hydropneumothorax, unchanged in size but now with pleural  fluid in its basilar portion. Right thoracostomy tube remains unchanged. Electronically Signed   By: Harmon Pier M.D.   On: 11/18/2016 10:14   Dg Chest Port 1 View  Result Date: 11/17/2016 CLINICAL DATA:  Follow-up right-sided chest tube placement. Initial encounter. EXAM: PORTABLE CHEST 1 VIEW COMPARISON:  Chest radiograph performed 11/16/2016 FINDINGS: The patient's moderate right-sided pneumothorax has decreased in size. The right-sided chest tube is grossly unchanged in appearance. Vascular congestion is noted. Patchy left-sided airspace opacities raise concern for pneumonia. Right-sided airspace opacity may reflect atelectasis. A small left pleural effusion is noted. The cardiomediastinal silhouette is mildly enlarged. No acute osseous abnormalities are identified. IMPRESSION: 1. Moderate right-sided pneumothorax has decreased in size. Right-sided chest tube is grossly unchanged in appearance. 2. Vascular congestion and mild cardiomegaly. Patchy left-sided airspace opacities raise concern for pneumonia. Small left pleural effusion noted. 3. Right-sided airspace opacity may reflect atelectasis. Electronically Signed   By: Roanna Raider M.D.   On: 11/17/2016 00:33   Dg Chest Port 1 View  Result Date: 11/16/2016 CLINICAL DATA:  Chest tube placement for right pneumothorax. EXAM: PORTABLE CHEST 1 VIEW COMPARISON:  Single-view of the chest earlier today and 11/11/2016. FINDINGS: Pigtail catheter is now in place in the right chest. Large right pneumothorax is unchanged. Patchy airspace disease in cavitary lesions in the left chest persist. Heart size is normal. IMPRESSION: No change in large right pneumothorax after chest tube placement. No change in patchy airspace disease in cavitary lesions on the left. These results were called by telephone at the time of interpretation on 11/16/2016 at 8:10 pm to Estrella Deeds, RN, who verbally acknowledged these results. Electronically Signed   By: Drusilla Kanner M.D.    On: 11/16/2016 20:12   Dg Chest Port 1 View  Result Date: 11/16/2016 CLINICAL DATA:  Shortness of Breath EXAM: PORTABLE CHEST 1 VIEW COMPARISON:  11/11/2016 FINDINGS: Cardiac shadow is within normal limits. Cavitary nodular densities are noted throughout both lungs similar to that seen on previous CT examination. A  new right-sided moderately large pneumothorax is noted with consolidation of the lower lobe on the right. No acute bony abnormality is noted. IMPRESSION: Moderately large right-sided pneumothorax new from the prior exam. Near complete collapse of the right lower lobe is noted. Stable cavitary lesions within both lungs. Critical Value/emergent results were called by telephone at the time of interpretation on 11/16/2016 at 6:24 pm to Dr Leitha BleakKatalina, who verbally acknowledged these results. Electronically Signed   By: Alcide CleverMark  Lukens M.D.   On: 11/16/2016 18:27   Dg Chest Port 1 View  Result Date: 11/11/2016 CLINICAL DATA:  Check right IJ line placement.  Initial encounter. EXAM: PORTABLE CHEST 1 VIEW COMPARISON:  Chest radiograph performed 11/10/2016 FINDINGS: The right IJ line is noted ending at the right atrium. This could be retracted 2-3 cm. Mild vascular congestion is noted. Mild left-sided opacities may reflect atelectasis or mild pneumonia. No pleural effusion or pneumothorax is seen. The cardiomediastinal silhouette is borderline normal in size. No acute osseous abnormalities are identified. IMPRESSION: 1. Right IJ line noted ending at the right atrium. This could be retracted 2-3 cm, as deemed clinically appropriate. 2. Mild vascular congestion noted. 3. Mild left-sided airspace opacities may reflect atelectasis or mild pneumonia. Electronically Signed   By: Roanna RaiderJeffery  Chang M.D.   On: 11/11/2016 00:37   Dg Chest Port 1 View  Result Date: 11/10/2016 CLINICAL DATA:  Retraction of the IJ line about 10 cm EXAM: PORTABLE CHEST 1 VIEW COMPARISON:  CT chest 11/10/2016.  Chest 11/10/2016. FINDINGS: The  left central venous catheter tip localizes to the mid/distal right atrial region. If placement at or above the cavoatrial junction is desired, retraction of about 3-4 cm is recommended. No pneumothorax. Shallow inspiration with atelectasis in the lung bases. Borderline heart size. Patchy airspace infiltrates in both lungs could indicate edema, aspiration, or pneumonia. No blunting of costophrenic angles. IMPRESSION: Central venous catheter tip is in the mid/ low right atrial region. If placement at or above the cavoatrial junction is desired, retraction of about 3-4 cm is recommended. Electronically Signed   By: Burman NievesWilliam  Stevens M.D.   On: 11/10/2016 21:35   Dg Chest Portable 1 View  Result Date: 11/10/2016 CLINICAL DATA:  Status post central line placement. EXAM: PORTABLE CHEST 1 VIEW COMPARISON:  Chest x-ray from earlier same day. FINDINGS: Interval placement of a right IJ central line with tip passing to the left of midline and likely in the right ventricle or main pulmonary artery. The bilateral small nodular airspace opacities throughout both lungs are unchanged in the short-term interval. Suspect small bilateral pleural effusions. No pneumothorax seen. IMPRESSION: 1. Right IJ central line placement with tip positioned to the left of midline either within the right ventricle or main pulmonary artery. Recommend retracting at least 10 cm. 2. Bilateral small nodular airspace opacities throughout both lungs are unchanged in the short-term interval. As recommended on earlier chest x-ray report, would consider chest CT for further characterization. 3. Suspect small bilateral pleural effusions. Electronically Signed   By: Bary RichardStan  Maynard M.D.   On: 11/10/2016 18:37    Lab Data:  CBC:  Recent Labs Lab 11/25/16 0445 11/26/16 0612 11/28/16 0405 11/29/16 0409 11/30/16 0440  WBC 5.6 5.5 6.8 6.6 7.6  HGB 7.1* 9.5* 9.7* 9.4* 9.9*  HCT 22.7* 29.2* 30.4* 29.4* 31.2*  MCV 88.3 87.7 86.6 87.5 87.6  PLT 332  326 329 317 327   Basic Metabolic Panel:  Recent Labs Lab 11/25/16 0445 11/26/16 0612 11/27/16 0400 11/28/16 0405  11/29/16 0409 11/30/16 0440 12/01/16 0349  NA 138 139 138 138  --   --  138  K 3.8 3.6 3.3* 3.5  --   --  3.4*  CL 107 107 104 105  --   --  104  CO2 26 24 27 26   --   --  28  GLUCOSE 97 86 94 98  --   --  95  BUN 10 10 6 8   --   --  10  CREATININE 0.80 0.92  0.95 0.79 0.78 0.71 0.78 0.68  CALCIUM 8.3* 8.3* 8.4* 8.0*  --   --  8.5*   GFR: Estimated Creatinine Clearance: 88.2 mL/min (by C-G formula based on SCr of 0.68 mg/dL). Liver Function Tests: No results for input(s): AST, ALT, ALKPHOS, BILITOT, PROT, ALBUMIN in the last 168 hours. No results for input(s): LIPASE, AMYLASE in the last 168 hours. No results for input(s): AMMONIA in the last 168 hours. Coagulation Profile: No results for input(s): INR, PROTIME in the last 168 hours. Cardiac Enzymes: No results for input(s): CKTOTAL, CKMB, CKMBINDEX, TROPONINI in the last 168 hours. BNP (last 3 results) No results for input(s): PROBNP in the last 8760 hours. HbA1C: No results for input(s): HGBA1C in the last 72 hours. CBG: No results for input(s): GLUCAP in the last 168 hours. Lipid Profile: No results for input(s): CHOL, HDL, LDLCALC, TRIG, CHOLHDL, LDLDIRECT in the last 72 hours. Thyroid Function Tests: No results for input(s): TSH, T4TOTAL, FREET4, T3FREE, THYROIDAB in the last 72 hours. Anemia Panel: No results for input(s): VITAMINB12, FOLATE, FERRITIN, TIBC, IRON, RETICCTPCT in the last 72 hours. Urine analysis:    Component Value Date/Time   COLORURINE YELLOW 11/10/2016 2132   APPEARANCEUR CLEAR 11/10/2016 2132   LABSPEC 1.006 11/10/2016 2132   PHURINE 5.0 11/10/2016 2132   GLUCOSEU NEGATIVE 11/10/2016 2132   HGBUR MODERATE (A) 11/10/2016 2132   BILIRUBINUR NEGATIVE 11/10/2016 2132   KETONESUR NEGATIVE 11/10/2016 2132   PROTEINUR NEGATIVE 11/10/2016 2132   UROBILINOGEN 0.2 08/05/2010 1821    NITRITE NEGATIVE 11/10/2016 2132   LEUKOCYTESUR SMALL (A) 11/10/2016 2132     Elaiza Shoberg M.D. [facility] 12/01/2016, 2:18 PM  Pager: [phone] Between 7am to 7pm - call Pager - [phone]  After 7pm go to [email or web address] - password [email or web address]  Call night coverage person covering after 7pm            [facility]                                                                              Patient Demographics  Skylyn Slezak, is a 26 y.o. female, DOB - [birth year], UJW:[id number]  Admit date - 11/10/2016   Admitting Physician Coralyn Helling, MD  Outpatient Primary MD for the patient is Patient, No Pcp Per  Outpatient specialists:   LOS - 21  days   Medical records reviewed and are as summarized below:    No chief complaint on file.      Brief summary    26 year old Caucasian female with a past medical history of anxiety, depression, heroin abuse, presented with weakness, cough and chest pain. Initially was in septic shock and required pressors. Blood cultures grew MRSA.  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 She was placed on IV antibiotics. She also developed a spontaneous right-sided pneumothorax. Chest tube was placed by critical care medicine. She was transferred to [facility] for opinion from cardiothoracic surgery. Patient is not a candidate for surgery at this time.   Assessment & Plan    Principal Problem: MRSA bacteremia/Septic shock with right TV endocarditis and septic emboli to lungs/MRSA empyema - Admitted with septic shock to ICU requiring vasopressors -Sepsis resolved -Blood cultures 5/27 grew MRSA and found to have septic pulmonary emboli -Repeat cultures subsequently positive but have been negative since 6/4  -2-D echo on 5/27 with tricuspid valve vegetations, moderate TR, TEE on 6/1 showed large tricuspid valve vegetation with severe TR and dilated RV  - Due  to persistent bacteremia patient was started on daptomycin 6/2 and Ceftaroline continued to cover lung infection. - cardiothoracic surgery was also consulted, not a candidate for surgery at this time.  - Per ID recommendations, stopped Ceftaroline and daptomycin, use vancomycin alone, duration through July 15, will need BMP, CBC weekly, trough goal of 15-20.  -ID follow-up to be arranged -she has been on suboxone intermittently for 2years, very reluctant to come off this now since it helps her significantly with cravings/risk of heroin relapse if she stops this  Acute respiratory failure/spontaneous right pneumothorax/MRSA empyema -Followed by CVTS, status post chest tube drainage, improved -Chest tube removed 6/7 -Stable now and repeat chest x-rays clear  Acute kidney injury -Secondary to NSAIDs and dehydration -Resolved  Ascites, anasarca due to right sided hear failure - as a result of severe TR, net positive balance of 13.7 L - limit IVF, hold off on administering diuretics in setting of sepsis and borderline BPs, O2 sats 96% on room air -By mouth Lasix 20 mg daily  Splenomegaly - etiology undetermined- CT on 5/30 revealed that the spleen is enlarged measuring 15.7 x 5.6 x 17 cm (volume= 780 cm^3) - liver is unremarkable on CT -Needs follow-up   Normocytic Anemia - H&H currently stable, was transfused on 6/5, no overt bleeding noted - Iron was 12. Ferritin 213. B-12 1850. Folate 7.8.  Hypokalemia/Hypophosphatemia/Hypomagnesemia - Resolved  Thrombocytopenia - Resolved  Heroin abuse - cont Suboxone, reluctant to come off this and risk of heroin relapse  Anxiety - cont Buspar, Neurontin, Xanax  Nicotine dependence -Continue  Nicoderm patch   Code Status: Full CODE STATUS  DVT Prophylaxis:  heparin  Family Communication: Mother at bedside Disposition Plan: SNF that will accept patient under current circumstances versus prolonged inpatient stay until completion  of Abx  Time Spent in minutes  25 minutes  Procedures:  TEE  - Left ventricle: The cavity size was normal. Wall thickness was normal. Systolic function was normal. The estimated ejection fraction was in the range of 55% to 60%. Wall motion was normal; there were no regional wall motion abnormalities. - Left atrium: No evidence of thrombus in the atrial cavity or appendage. - Right ventricle: The cavity size was mildly dilated. Wall thickness was normal. - Right atrium: No evidence of thrombus in the atrial cavity or appendage. - Tricuspid valve: Flail motion of the posterior leaflet. There is a 8x5 mm vegetation attached to the posterior leaflet, but most of the &quot;mass&quot; described on transthoracic imaging appears to be the flail segment of the valve. There was severe regurgitation directed eccentrically and toward the free wall.  Right chest tube  placement for pneumothorax 6/1  Placement of a larger bore chest tube in the right chest 6/3  Consultants:   Infectious disease Cardiothoracic surgery  Antimicrobials:   Daptomycin 6/2> 6/8  Teflaro 5/31> 6/8  Vancomycin 6/8>> will need to July 15   Medications  Scheduled Meds: . acetaminophen  1,000 mg Oral Q6H  . buprenorphine-naloxone  2 tablet Sublingual Daily  . busPIRone  10 mg Oral BID  . gabapentin  300 mg Oral TID  . heparin  5,000 Units Subcutaneous Q8H  . nicotine  14 mg Transdermal Daily  . saccharomyces boulardii  250 mg Oral BID   Continuous Infusions: . sodium chloride 250 mL (11/28/16 2032)  . vancomycin 750 mg (12/01/16 1105)   PRN Meds:.sodium chloride, albuterol, ALPRAZolam, docusate sodium, ibuprofen, nicotine polacrilex, ondansetron (ZOFRAN) IV, oxyCODONE, sodium chloride flush   Antibiotics   Anti-infectives    Start     Dose/Rate Route Frequency Ordered Stop   11/29/16 1000  vancomycin (VANCOCIN) IVPB 750 mg/150 ml premix     750 mg 150 mL/hr over 60 Minutes  Intravenous Every 12 hours 11/29/16 0934     11/26/16 2000  vancomycin (VANCOCIN) IVPB 1000 mg/200 mL premix  Status:  Discontinued     1,000 mg 200 mL/hr over 60 Minutes Intravenous Every 12 hours 11/26/16 1912 11/29/16 0853   11/24/16 0000  vancomycin (VANCOCIN) IVPB 1000 mg/200 mL premix  Status:  Discontinued     1,000 mg 200 mL/hr over 60 Minutes Intravenous Every 8 hours 11/23/16 1238 11/26/16 0721   11/23/16 1600  vancomycin (VANCOCIN) 1,500 mg in sodium chloride 0.9 % 500 mL IVPB     1,500 mg 250 mL/hr over 120 Minutes Intravenous  Once 11/23/16 1238 11/23/16 1733   11/17/16 1600  DAPTOmycin (CUBICIN) 500 mg in sodium chloride 0.9 % IVPB  Status:  Discontinued     500 mg 220 mL/hr over 30 Minutes Intravenous Every 24 hours 11/17/16 1432 11/23/16 1238   11/17/16 1500  ceftaroline (TEFLARO) 600 mg in sodium chloride 0.9 % 250 mL IVPB  Status:  Discontinued     600 mg 250 mL/hr over 60 Minutes Intravenous Every 12 hours 11/17/16 1435 11/23/16 1238   11/15/16 1800  ceftaroline (TEFLARO) 600 mg in sodium chloride 0.9 % 250 mL IVPB  Status:  Discontinued     600 mg 250 mL/hr over 60 Minutes Intravenous Every 12 hours 11/15/16 1635 11/17/16 1431   11/13/16 1215  vancomycin (VANCOCIN) IVPB 1000 mg/200 mL premix  Status:  Discontinued     1,000 mg 200 mL/hr over 60 Minutes Intravenous Every 8 hours 11/13/16 1203 11/16/16 0850   11/11/16 1100  vancomycin (VANCOCIN) IVPB 750 mg/150 ml premix  Status:  Discontinued     750 mg 150 mL/hr over 60 Minutes Intravenous Every 8 hours 11/11/16 0956 11/13/16 1203   11/11/16 0400  vancomycin (VANCOCIN) 500 mg in sodium chloride 0.9 % 100 mL IVPB  Status:  Discontinued     500 mg 100 mL/hr over 60 Minutes Intravenous Every 12 hours 11/10/16 1951 11/11/16 0956   11/10/16 2200  ceFEPIme (MAXIPIME) 1 g in dextrose 5 % 50 mL IVPB  Status:  Discontinued     1 g 100 mL/hr over 30 Minutes Intravenous Every 12 hours 11/10/16 1951 11/11/16 0926   11/10/16  2000  ceFEPIme (MAXIPIME) 2 g in dextrose 5 % 50 mL IVPB  Status:  Discontinued     2 g 100 mL/hr over 30 Minutes Intravenous  Once 11/10/16 1952 11/10/16 1954   11/10/16 2000  vancomycin (VANCOCIN) IVPB 1000 mg/200 mL premix  Status:  Discontinued     1,000 mg 200 mL/hr over 60 Minutes Intravenous  Once 11/10/16 1952 11/10/16 1954   11/10/16 1430  piperacillin-tazobactam (ZOSYN) IVPB 3.375 g     3.375 g 100 mL/hr over 30 Minutes Intravenous  Once 11/10/16 1426 11/10/16 1503   11/10/16 1430  vancomycin (VANCOCIN) IVPB 1000 mg/200 mL premix     1,000 mg 200 mL/hr over 60 Minutes Intravenous  Once 11/10/16 1426 11/10/16 1653        Subjective:  Feels good, very hesitant to come off suboxone  Objective:   Vitals:   11/30/16 1626 11/30/16 2256 12/01/16 0500 12/01/16 0703  BP: 122/75 123/78  (!) 139/91  Pulse: 66 69  65  Resp: 20 17  17   Temp: 98.5 F (36.9 C) 98.3 F (36.8 C)  98.8 F (37.1 C)  TempSrc:      SpO2: 97% 98%  95%  Weight:   59.9 kg (132 lb)   Height:        Intake/Output Summary (Last 24 hours) at 12/01/16 1418 Last data filed at 12/01/16 1401  Gross per 24 hour  Intake              480 ml  Output                0 ml  Net              480 ml     Wt Readings from Last 3 Encounters:  12/01/16 59.9 kg (132 lb)  03/14/16 58.5 kg (129 lb)  01/19/16 58.1 kg (128 lb)     Exam  Gen: AAOx3, no distress HEENT: PERRLA, no JVD Lungs: CTAB, good air movement CVS: RRR,Grade 2 systolic murmur Abd: soft, Non tender, non distended, BS present Extremities: Trace edema, improving  Data Reviewed:  I have personally reviewed following labs and imaging studies  Micro Results No results found for this or any previous visit (from the past 240 hour(s)).  Radiology Reports Dg Chest 2 View  Result Date: 11/23/2016 CLINICAL DATA:  Chest tube removal EXAM: CHEST  2 VIEW COMPARISON:  11/21/2016 FINDINGS: Interval removal of right chest tube with residual small right  pleural effusion versus pleural thickening. No pneumothorax. Patchy bilateral airspace opacities are again noted, unchanged. Heart is borderline in size. IMPRESSION: Interval removal of right chest tube without pneumothorax. Otherwise no change. Electronically Signed   By: Charlett Nose M.D.   On: 11/23/2016 08:44   Dg Chest 2 View  Result Date: 11/21/2016 CLINICAL DATA:  Chest soreness.  Chest tube . EXAM: CHEST  2 VIEW COMPARISON:  CT 11/20/2016.  Chest x-ray 11/20/2016 FINDINGS: Chest tube noted on the right. Tiny right anterior hydropneumothorax again noted. No interim change . Stable cardiomegaly. Persistent bilateral pulmonary nodules including cavitary nodules. Reference is made to prior CT report. Small right pleural effusion. No pneumothorax. IMPRESSION: 1. Right chest tube in stable position. Tiny right anterior hydropneumothorax again noted. No interim change. 2. Multiple bilateral pulmonary nodules with cavitation. Reference is made to prior CT report of 11/20/2016 . Electronically Signed   By: Maisie Fus  Register   On: 11/21/2016 11:15   Dg Chest 2 View  Result Date: 11/19/2016 CLINICAL DATA:  Pneumothorax. EXAM: CHEST  2 VIEW COMPARISON:  11/19/2016. FINDINGS: Right chest tube in stable position. No significant pneumothorax noted on today's exam. Small right pleural effusion.  Bilateral patchy pulmonary infiltrates with cavitation again noted. Small left pleural effusion noted. Heart size stable. No acute bony abnormality . IMPRESSION: 1. Right chest tube in stable position. No pneumothorax noted on today's exam. Small right pleural effusion. 2. Persistent bilateral cavitary pulmonary infiltrates. Small left pleural effusion. Electronically Signed   By: Maisie Fus  Register   On: 11/19/2016 16:53   Dg Chest 2 View  Result Date: 11/10/2016 CLINICAL DATA:  Chest pain, shortness of breath for 1 week EXAM: CHEST  2 VIEW COMPARISON:  CT chest 03/12/2016, chest x-ray 03/12/2016 FINDINGS: There are bilateral  nodular airspace opacities throughout the upper and lower lobes bilaterally. There is no pleural effusion or pneumothorax. The heart and mediastinal contours are unremarkable. The osseous structures are unremarkable. IMPRESSION: New bilateral nodular airspace opacities throughout the upper and lower lobes bilaterally. Findings are concerning for multifocal infection such as septic emboli. Recommend further evaluation with a CT of the chest with intravenous contrast. Electronically Signed   By: Elige Ko   On: 11/10/2016 13:23   Ct Chest Wo Contrast  Result Date: 11/20/2016 CLINICAL DATA:  Follow-up right pleural effusion EXAM: CT CHEST WITHOUT CONTRAST TECHNIQUE: Multidetector CT imaging of the chest was performed following the standard protocol without IV contrast. COMPARISON:  Radiograph 11/20/2016, CT chest 11/10/2016, prior radiographs dating back to 11/10/2016 FINDINGS: Cardiovascular: Limited assessment without intravenous contrast. Small fluid in the superior pericardial recess. There is cardiomegaly. Mediastinum/Nodes: Difficult evaluation without contrast. Mediastinal adenopathy is present right paratracheal soft tissue fullness, possible adenopathy does not appear significantly changed. Midline trachea. Esophagus grossly unremarkable. Lungs/Pleura: Interim placement of a right lower lateral chest tube since the prior CT with the tip terminating along the right upper posterior pleural surface. Small bilateral pleural effusions right greater than left, slightly increased compared to the previous CT. Tiny right anterior hydropneumothorax. Multiple bilateral pulmonary nodules and cavitary lesions. Some of the cavitary lesions have decreased, however if there appears to be increased nodules/disease most notable in the left upper lobe and the right lower lobe. Decreased septal thickening compared to the previous exam. Upper Abdomen: Spleen appears enlarged. Partially visualized surgical clips in the  gallbladder fossa. Musculoskeletal: No acute or suspicious bone lesion. IMPRESSION: 1. Interim placement of right-sided chest tube. Small residual pleural effusions right greater than left, mildly increased compared to the prior chest CT. Small right anterior hydropneumothorax. 2. Innumerable bilateral lung nodules and cavitary lesions, suspected to represent septic emboli. This appears increased in the left upper and right lower lobe since the prior CT. 3. Suspect mediastinal adenopathy although evaluation of the mediastinum is limited without contrast 4. Splenomegaly Electronically Signed   By: Jasmine Pang M.D.   On: 11/20/2016 21:25   Ct Chest Wo Contrast  Result Date: 11/10/2016 CLINICAL DATA:  She c/o some chest discomfrot and shortness of breath x 1 week. She states these are similar s/s she had a year ago, at which time she was dx with "pulmonary abscess". She is in no distress. IV drug user EXAM: CT CHEST WITHOUT CONTRAST TECHNIQUE: Multidetector CT imaging of the chest was performed following the standard protocol without IV contrast. COMPARISON:  Chest CT angiogram dated 03/12/2016. FINDINGS: Cardiovascular: Probable small pericardial effusion and/or pericardial thickening, difficult to characterize without IV contrast. Heart size is within normal limits. Thoracic aorta appears to be normal in caliber. Mediastinum/Nodes: Soft tissue density material within the right upper peritracheal space, also difficult to characterize without IV contrast, presumed lymphadenopathy. Additional mild lymphadenopathy within the anterior  mediastinum and aortopulmonary window region. Lungs/Pleura: Numerous cavitary consolidations throughout both lungs, peripherally distributed suggesting septic emboli. More confluent consolidations at each lung base, more likely atelectasis than pneumonia. Small right pleural effusion. Upper Abdomen: Suspected splenomegaly, incompletely imaged. Musculoskeletal: No acute or suspicious  osseous finding. IMPRESSION: 1. Numerous cavitary consolidations throughout both lungs, with a peripheral distribution suggesting septic emboli. Largest cavitary consolidation is within the left lower lobe measuring approximately 4 cm greatest dimension. 2. Probable small pericardial effusion and/or pericardial wall thickening, difficult to delineate without IV contrast. 3. Probable mediastinal lymphadenopathy, again difficult to definitively characterize without IV contrast, alternatively confluent fluid or edema within the mediastinum. 4. Small right pleural effusion. 5. Probable splenomegaly, incompletely imaged at the lower aspects of this chest CT. 6. Right IJ line in place with tip passing through the right atrium and into the right ventricle. Recommend retracting to the cavoatrial junction for optimal radiographic positioning. These results and recommendations were called by telephone at the time of interpretation on 11/10/2016 at 7:35 pm to Dr. Katrinka Blazing , who verbally acknowledged these results. Electronically Signed   By: Bary Richard M.D.   On: 11/10/2016 19:36   Ct Abdomen Pelvis W Contrast  Result Date: 11/14/2016 CLINICAL DATA:  Right upper quadrant pain times 2-3 days. Polysubstance abuse. Cholecystectomy. History of endocarditis and septic emboli. EXAM: CT ABDOMEN AND PELVIS WITH CONTRAST TECHNIQUE: Multidetector CT imaging of the abdomen and pelvis was performed using the standard protocol following bolus administration of intravenous contrast. CONTRAST:  80mL ISOVUE-300 IOPAMIDOL (ISOVUE-300) INJECTION 61% COMPARISON:  03/12/2016 chest CT FINDINGS: Lower chest: Small right effusion with several bilateral cavitary opacities in both lower lobes concerning for septic emboli or cavitary pneumonia. More confluent airspace opacity in the left lower lobe is also noted. The visualized heart is normal in size. There is no pericardial effusion. No large central pulmonary embolus. Hepatobiliary: Normal  appearance of the liver without space-occupying mass or biliary dilatation. Cholecystectomy. Pancreas: No pancreatic mass or ductal dilatation. Mild enlargement of the pancreatic head with surrounding peripancreatic fluid is noted. Correlate to exclude a pancreatitis. Spleen: The spleen is enlarged measuring 15.7 x 5.6 x 17 cm (volume = 780 cm^3) and demonstrates a developmental cleft, partially visualized on prior chest CT and therefore believed less likely to represent a laceration. Adrenals/Urinary Tract: Adrenal glands are unremarkable. Kidneys are normal, without renal calculi, solid appearing lesions, or hydronephrosis. There appear to be pair of water attenuating cysts in the lower pole the right kidney measuring up to 2 x 1.3 cm in aggregate. Bladder is unremarkable. Stomach/Bowel: Stomach is within normal limits. Appendix appears normal. No evidence of bowel wall thickening, distention, or inflammatory changes. Vascular/Lymphatic: No significant vascular findings are present. No enlarged abdominal or pelvic lymph nodes. Reproductive: Uterus and bilateral adnexa are unremarkable. Other: Moderate volume of ascites.  Anasarca. Musculoskeletal: No acute or significant osseous findings. IMPRESSION: 1. Bibasilar, multifocal cavitary lesions noted within the visualized lower lobes consistent with septic emboli or possibly cavitary pneumonia. More confluent airspace disease in the left lower lobe without cavitation is also present. There is a small right pleural effusion. 2. Anasarca with moderate volume of ascites noted within the abdomen and pelvis. Question right heart dysfunction/failure. 3. Splenomegaly. 4. Water attenuating cysts in the lower pole the right kidney in aggregate measuring 2 x 1.3 cm. 5. Status post cholecystectomy. Electronically Signed   By: Tollie Eth M.D.   On: 11/14/2016 16:26   Dg Chest 1v Repeat Same Day  Result  Date: 11/18/2016 CLINICAL DATA:  Chest tube placement EXAM: CHEST - 1  VIEW SAME DAY COMPARISON:  Earlier same day FINDINGS: Large bore chest tube placed on the right along the lateral margin. Marked reduction an amount of right pleural air, nearly completely evacuated. Patchy infiltrates persist in both lower lobes. IMPRESSION: Large bore chest tube placed. Near complete resolution of right pneumothorax. Electronically Signed   By: Paulina Fusi M.D.   On: 11/18/2016 11:40   Dg Chest Port 1 View  Result Date: 11/20/2016 CLINICAL DATA:  27 year old female with pneumothorax. Recent transesophageal echocardiogram. Subsequent encounter. EXAM: PORTABLE CHEST 1 VIEW COMPARISON:  11/20/2016 8:30 a.m. FINDINGS: Right-sided chest tube remains in place. No pneumothorax detected on this frontal projection. Cavitary lesions bilaterally. Left base consolidation. Atelectasis/pleural thickening right lung base. Cardiomegaly. IMPRESSION: No significant change since exam earlier today. Electronically Signed   By: Lacy Duverney M.D.   On: 11/20/2016 13:55   Dg Chest Port 1 View  Result Date: 11/20/2016 CLINICAL DATA:  Chest tube, shortness of Breath EXAM: PORTABLE CHEST 1 VIEW COMPARISON:  11/19/2016 FINDINGS: Right chest tube remains in place, unchanged. No pneumothorax. Patchy bilateral airspace opacities are again noted, some with cavitation. Small right pleural effusion. Heart is borderline in size. IMPRESSION: Right chest tube remains in place without pneumothorax. Stable patchy bilateral airspace disease, some of which is cavitary. Electronically Signed   By: Charlett Nose M.D.   On: 11/20/2016 08:42   Dg Chest Port 1 View  Result Date: 11/19/2016 CLINICAL DATA:  26 year old female with cavitary lung lesions possibly from septic endocarditis. Subsequent encounter. EXAM: PORTABLE CHEST 1 VIEW COMPARISON:  11/18/2016. FINDINGS: Right-sided chest tube remains in place. Tiny hydropneumothorax versus fluid within minor fissure noted. Multiple cavitary lesions. Right-sided pleural effusion.  Basilar atelectasis versus infiltrate greater on the left. Cardiomegaly. Mild scoliosis. IMPRESSION: Right-sided chest tube remains in place. Tiny hydropneumothorax versus fluid within minor fissure. Multiple cavitary lesions. Right-sided pleural effusion. Basilar atelectasis versus infiltrate greater on the left. Electronically Signed   By: Lacy Duverney M.D.   On: 11/19/2016 07:16   Dg Chest Port 1 View  Result Date: 11/18/2016 CLINICAL DATA:  Followup left pneumothorax. Patient with bacterial endocarditis. EXAM: PORTABLE CHEST 1 VIEW COMPARISON:  11/17/2016 and prior studies FINDINGS: A moderate right hydropneumothorax is unchanged in size, but now with pleural fluid in its basilar portion. A right thoracostomy tube is unchanged. Scattered pulmonary opacities/ nodules and left lower lung consolidations/atelectasis again noted. There has been no other interval change. IMPRESSION: Moderate right hydropneumothorax, unchanged in size but now with pleural fluid in its basilar portion. Right thoracostomy tube remains unchanged. Electronically Signed   By: Harmon Pier M.D.   On: 11/18/2016 10:14   Dg Chest Port 1 View  Result Date: 11/17/2016 CLINICAL DATA:  Follow-up right-sided chest tube placement. Initial encounter. EXAM: PORTABLE CHEST 1 VIEW COMPARISON:  Chest radiograph performed 11/16/2016 FINDINGS: The patient's moderate right-sided pneumothorax has decreased in size. The right-sided chest tube is grossly unchanged in appearance. Vascular congestion is noted. Patchy left-sided airspace opacities raise concern for pneumonia. Right-sided airspace opacity may reflect atelectasis. A small left pleural effusion is noted. The cardiomediastinal silhouette is mildly enlarged. No acute osseous abnormalities are identified. IMPRESSION: 1. Moderate right-sided pneumothorax has decreased in size. Right-sided chest tube is grossly unchanged in appearance. 2. Vascular congestion and mild cardiomegaly. Patchy left-sided  airspace opacities raise concern for pneumonia. Small left pleural effusion noted. 3. Right-sided airspace opacity may reflect atelectasis. Electronically Signed  By: Roanna Raider M.D.   On: 11/17/2016 00:33   Dg Chest Port 1 View  Result Date: 11/16/2016 CLINICAL DATA:  Chest tube placement for right pneumothorax. EXAM: PORTABLE CHEST 1 VIEW COMPARISON:  Single-view of the chest earlier today and 11/11/2016. FINDINGS: Pigtail catheter is now in place in the right chest. Large right pneumothorax is unchanged. Patchy airspace disease in cavitary lesions in the left chest persist. Heart size is normal. IMPRESSION: No change in large right pneumothorax after chest tube placement. No change in patchy airspace disease in cavitary lesions on the left. These results were called by telephone at the time of interpretation on 11/16/2016 at 8:10 pm to Estrella Deeds, RN, who verbally acknowledged these results. Electronically Signed   By: Drusilla Kanner M.D.   On: 11/16/2016 20:12   Dg Chest Port 1 View  Result Date: 11/16/2016 CLINICAL DATA:  Shortness of Breath EXAM: PORTABLE CHEST 1 VIEW COMPARISON:  11/11/2016 FINDINGS: Cardiac shadow is within normal limits. Cavitary nodular densities are noted throughout both lungs similar to that seen on previous CT examination. A new right-sided moderately large pneumothorax is noted with consolidation of the lower lobe on the right. No acute bony abnormality is noted. IMPRESSION: Moderately large right-sided pneumothorax new from the prior exam. Near complete collapse of the right lower lobe is noted. Stable cavitary lesions within both lungs. Critical Value/emergent results were called by telephone at the time of interpretation on 11/16/2016 at 6:24 pm to Dr Leitha Bleak, who verbally acknowledged these results. Electronically Signed   By: Alcide Clever M.D.   On: 11/16/2016 18:27   Dg Chest Port 1 View  Result Date: 11/11/2016 CLINICAL DATA:  Check right IJ line placement.   Initial encounter. EXAM: PORTABLE CHEST 1 VIEW COMPARISON:  Chest radiograph performed 11/10/2016 FINDINGS: The right IJ line is noted ending at the right atrium. This could be retracted 2-3 cm. Mild vascular congestion is noted. Mild left-sided opacities may reflect atelectasis or mild pneumonia. No pleural effusion or pneumothorax is seen. The cardiomediastinal silhouette is borderline normal in size. No acute osseous abnormalities are identified. IMPRESSION: 1. Right IJ line noted ending at the right atrium. This could be retracted 2-3 cm, as deemed clinically appropriate. 2. Mild vascular congestion noted. 3. Mild left-sided airspace opacities may reflect atelectasis or mild pneumonia. Electronically Signed   By: Roanna Raider M.D.   On: 11/11/2016 00:37   Dg Chest Port 1 View  Result Date: 11/10/2016 CLINICAL DATA:  Retraction of the IJ line about 10 cm EXAM: PORTABLE CHEST 1 VIEW COMPARISON:  CT chest 11/10/2016.  Chest 11/10/2016. FINDINGS: The left central venous catheter tip localizes to the mid/distal right atrial region. If placement at or above the cavoatrial junction is desired, retraction of about 3-4 cm is recommended. No pneumothorax. Shallow inspiration with atelectasis in the lung bases. Borderline heart size. Patchy airspace infiltrates in both lungs could indicate edema, aspiration, or pneumonia. No blunting of costophrenic angles. IMPRESSION: Central venous catheter tip is in the mid/ low right atrial region. If placement at or above the cavoatrial junction is desired, retraction of about 3-4 cm is recommended. Electronically Signed   By: Burman Nieves M.D.   On: 11/10/2016 21:35   Dg Chest Portable 1 View  Result Date: 11/10/2016 CLINICAL DATA:  Status post central line placement. EXAM: PORTABLE CHEST 1 VIEW COMPARISON:  Chest x-ray from earlier same day. FINDINGS: Interval placement of a right IJ central line with tip passing to the left of  midline and likely in the right ventricle  or main pulmonary artery. The bilateral small nodular airspace opacities throughout both lungs are unchanged in the short-term interval. Suspect small bilateral pleural effusions. No pneumothorax seen. IMPRESSION: 1. Right IJ central line placement with tip positioned to the left of midline either within the right ventricle or main pulmonary artery. Recommend retracting at least 10 cm. 2. Bilateral small nodular airspace opacities throughout both lungs are unchanged in the short-term interval. As recommended on earlier chest x-ray report, would consider chest CT for further characterization. 3. Suspect small bilateral pleural effusions. Electronically Signed   By: Bary Richard M.D.   On: 11/10/2016 18:37    Lab Data:  CBC:  Recent Labs Lab 11/25/16 0445 11/26/16 0612 11/28/16 0405 11/29/16 0409 11/30/16 0440  WBC 5.6 5.5 6.8 6.6 7.6  HGB 7.1* 9.5* 9.7* 9.4* 9.9*  HCT 22.7* 29.2* 30.4* 29.4* 31.2*  MCV 88.3 87.7 86.6 87.5 87.6  PLT 332 326 329 317 327   Basic Metabolic Panel:  Recent Labs Lab 11/25/16 0445 11/26/16 0612 11/27/16 0400 11/28/16 0405 11/29/16 0409 11/30/16 0440 12/01/16 0349  NA 138 139 138 138  --   --  138  K 3.8 3.6 3.3* 3.5  --   --  3.4*  CL 107 107 104 105  --   --  104  CO2 26 24 27 26   --   --  28  GLUCOSE 97 86 94 98  --   --  95  BUN 10 10 6 8   --   --  10  CREATININE 0.80 0.92  0.95 0.79 0.78 0.71 0.78 0.68  CALCIUM 8.3* 8.3* 8.4* 8.0*  --   --  8.5*   GFR: Estimated Creatinine Clearance: 88.2 mL/min (by C-G formula based on SCr of 0.68 mg/dL). Liver Function Tests: No results for input(s): AST, ALT, ALKPHOS, BILITOT, PROT, ALBUMIN in the last 168 hours. No results for input(s): LIPASE, AMYLASE in the last 168 hours. No results for input(s): AMMONIA in the last 168 hours. Coagulation Profile: No results for input(s): INR, PROTIME in the last 168 hours. Cardiac Enzymes: No results for input(s): CKTOTAL, CKMB, CKMBINDEX, TROPONINI in the last  168 hours. BNP (last 3 results) No results for input(s): PROBNP in the last 8760 hours. HbA1C: No results for input(s): HGBA1C in the last 72 hours. CBG: No results for input(s): GLUCAP in the last 168 hours. Lipid Profile: No results for input(s): CHOL, HDL, LDLCALC, TRIG, CHOLHDL, LDLDIRECT in the last 72 hours. Thyroid Function Tests: No results for input(s): TSH, T4TOTAL, FREET4, T3FREE, THYROIDAB in the last 72 hours. Anemia Panel: No results for input(s): VITAMINB12, FOLATE, FERRITIN, TIBC, IRON, RETICCTPCT in the last 72 hours. Urine analysis:    Component Value Date/Time   COLORURINE YELLOW 11/10/2016 2132   APPEARANCEUR CLEAR 11/10/2016 2132   LABSPEC 1.006 11/10/2016 2132   PHURINE 5.0 11/10/2016 2132   GLUCOSEU NEGATIVE 11/10/2016 2132   HGBUR MODERATE (A) 11/10/2016 2132   BILIRUBINUR NEGATIVE 11/10/2016 2132   KETONESUR NEGATIVE 11/10/2016 2132   PROTEINUR NEGATIVE 11/10/2016 2132   UROBILINOGEN 0.2 08/05/2010 1821   NITRITE NEGATIVE 11/10/2016 2132   LEUKOCYTESUR SMALL (A) 11/10/2016 2132     Chrsitopher Wik M.D. [facility] 12/01/2016, 2:17 PM  Contact via [email or web address], password [email or web address] Call night coverage person covering after 7pm

## 2016-12-01 NOTE — Progress Notes (Signed)
Notified Dr. Katrinka BlazingSmith that pt is crying and upset because she has been having abdominal bloating and swelling since admitted to hospital. Pt states that she has gained about 10 lbs since being admitted in hospital. Pt's mom has called pt after researching abdominal swelling in pt's with endocarditis and it could be serious like congestive heart failure. Pt wanted MD notified tonight of situation. MD stated that he was aware and no new orders given. Will continue to monitor pt. Jerrye BushyJenny Thacker,RN

## 2016-12-02 LAB — CREATININE, SERUM
CREATININE: 0.63 mg/dL (ref 0.44–1.00)
GFR calc non Af Amer: >60 mL/min (ref >60)

## 2016-12-02 MED ORDER — POTASSIUM CHLORIDE CRYS ER 20 MEQ PO TBCR
40.0000 meq | EXTENDED_RELEASE_TABLET | Freq: Every day | ORAL | Status: DC
  Administered 2016-12-02 – 2016-12-30 (×29): 40 meq via ORAL
  Filled 2016-12-02 (×29): qty 2

## 2016-12-02 MED ORDER — FUROSEMIDE 20 MG PO TABS
20.0000 mg | ORAL_TABLET | Freq: Every day | ORAL | Status: DC
  Administered 2016-12-02 – 2016-12-05 (×4): 20 mg via ORAL
  Filled 2016-12-02 (×4): qty 1

## 2016-12-02 NOTE — Progress Notes (Signed)
[facility]                                                                              Patient Demographics  Savannah Palmer, is a 26 y.o. female, DOB - 1990-08-12, ZOX:[id number]  Admit date - 11/10/2016   Admitting Physician Coralyn Helling, MD  Outpatient Primary MD for the patient is Patient, No Pcp Per  Outpatient specialists:   LOS - 22  days   Medical records reviewed and are as summarized below:    No chief complaint on file.      Brief summary    26 year old Caucasian female with a past medical history of anxiety, depression, heroin abuse, presented with weakness, cough and chest pain. Initially was in septic shock and required pressors. Blood cultures grew MRSA. 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 She was placed on IV antibiotics. She also developed a spontaneous right-sided pneumothorax. Chest tube was placed by critical care medicine. She was transferred to [facility] for opinion from cardiothoracic surgery. Patient is not a candidate for surgery at this time.   Assessment & Plan    Principal Problem: MRSA bacteremia/Septic shock with right TV endocarditis and septic emboli to lungs/MRSA empyema - Patient presented with weakness, coughing, chest pain and septic shock requiring vasopressors. Blood cultures 5/27 grew out MRSA and was found to have septic emboli throughout her lungs.  -Repeat cultures were still positive, blood cultures repeat on 6/4 has been negative to date  - 2-D echo on 5/27 showed tricuspid vegetations with moderate TR. TEE on 6/1 showed large tricuspid vegetation, severe TR with dilated RV along with a loculated left effusion - HIV, Hep C negative. Due to family request, RPR was done and it was negative. - Due to persistent bacteremia patient was started on daptomycin 6/2 and Ceftaroline was continued to cover lung infection. Per ID  recommendations, Ceftaroline and daptomycin have been now discontinued. - cardiothoracic surgery was also consulted and patient was deemed not a candidate for surgery at this time.  - on vancomycin duration through July 15, will need BMP, CBC weekly, trough goal of 15-20.  - PICC line placed on 6/9, continue vancomycin, until July 15. Vanco trough in goal 19 -  Pending skilled nursing facility placement   Active problems: Right spontaneous pneumothorax- acute respiratory failure/MRSA Empyema -Right empyema with pneumothorax, positive for MRSA. Status post chest tube. - Chest tube removed on 6/7, chest x-ray clear   Acute kidney injury -Baseline creatinine 0.7, creatinine increased to 1.37, likely secondary to dehydration and NSAIDs. - patient received IV fluids, NSAIDs were discontinued, and creatinine function has normalized.   RUQ pain, likely musculoskeletal -CT abdomen and pelvis showed no liver or gallbladder abnormalities, possible musculoskeletal origin, currently stable.    Ascites, anasarca due to right sided hear failure - as a result of severe TR, net positive balance of 12L - O2 sats 96% on room air  Splenomegaly - etiology undetermined- CT on 5/30 revealed that the spleen is enlarged measuring 15.7 x 5.6 x 17 cm (volume= 780 cm^3) - liver  is unremarkable on CT  Normocytic Anemia - H&H currently stable, was transfused on 6/5, no overt bleeding noted - Iron was 12. Ferritin 213. B-12 1850. Folate 7.8. - Hemoglobin down to 7.1 on 6/10, patient was transfused 2 units packed RBC with 1 dose of IV Feraheme, currently stable  Hypokalemia/Hypophosphatemia/Hypomagnesemia - Potassium 3.3, replaced  Thrombocytopenia - Resolved  Heroin abuse - cont Suboxone, will need transition to a drug rehab program once stable.  Anxiety - Celexa discontinued per patient's request. Buspar restarted again today per patient's request.  - continue Neurontin and Xanax as needed at  this time.   Nicotine dependence -Continue  Nicoderm patch   Code Status: Full CODE STATUS  DVT Prophylaxis:  heparin  Family Communication: Discussed in detail with the patient, all imaging results, lab results explained to the patient and patient's mother at the bedside. Requested patient's mother not to video record any staff or providers.    Disposition Plan: Hopefully DC to skilled nursing facility when a bed is available. Disposition difficulty due to patient being on suboxone and history of IVDA   Time Spent in minutes  25 minutes  Procedures:  TEE  - Left ventricle: The cavity size was normal. Wall thickness was normal. Systolic function was normal. The estimated ejection fraction was in the range of 55% to 60%. Wall motion was normal; there were no regional wall motion abnormalities. - Left atrium: No evidence of thrombus in the atrial cavity or appendage. - Right ventricle: The cavity size was mildly dilated. Wall thickness was normal. - Right atrium: No evidence of thrombus in the atrial cavity or appendage. - Tricuspid valve: Flail motion of the posterior leaflet. There is a 8x5 mm vegetation attached to the posterior leaflet, but most of the &quot;mass&quot; described on transthoracic imaging appears to be the flail segment of the valve. There was severe regurgitation directed eccentrically and toward the free wall.  Right chest tube placement for pneumothorax 6/1  Placement of a larger bore chest tube in the right chest 6/3  PICC line placed on 6/9  Consultants:   Infectious disease Cardiothoracic surgery  Antimicrobials:   Daptomycin 6/2> 6/8  Teflaro 5/31> 6/8  Vancomycin 6/8>> will need till July 15   Medications  Scheduled Meds: . acetaminophen  1,000 mg Oral Q6H  . buprenorphine-naloxone  2 tablet Sublingual Daily  . busPIRone  10 mg Oral BID  . furosemide  20 mg Oral Daily  . gabapentin  300 mg Oral TID  .  heparin  5,000 Units Subcutaneous Q8H  . nicotine  14 mg Transdermal Daily  . potassium chloride  40 mEq Oral Daily  . saccharomyces boulardii  250 mg Oral BID   Continuous Infusions: . sodium chloride 250 mL (11/28/16 2032)  . vancomycin Stopped (12/02/16 1031)   PRN Meds:.sodium chloride, albuterol, ALPRAZolam, docusate sodium, ibuprofen, nicotine polacrilex, ondansetron (ZOFRAN) IV, oxyCODONE, sodium chloride flush   Antibiotics   Anti-infectives    Start     Dose/Rate Route Frequency Ordered Stop   11/29/16 1000  vancomycin (VANCOCIN) IVPB 750 mg/150 ml premix     750 mg 150 mL/hr over 60 Minutes Intravenous Every 12 hours 11/29/16 0934     11/26/16 2000  vancomycin (VANCOCIN) IVPB 1000 mg/200 mL premix  Status:  Discontinued     1,000 mg 200 mL/hr over 60 Minutes Intravenous Every 12 hours 11/26/16 1912 11/29/16 0853   11/24/16 0000  vancomycin (VANCOCIN) IVPB 1000 mg/200 mL premix  Status:  Discontinued  1,000 mg 200 mL/hr over 60 Minutes Intravenous Every 8 hours 11/23/16 1238 11/26/16 0721   11/23/16 1600  vancomycin (VANCOCIN) 1,500 mg in sodium chloride 0.9 % 500 mL IVPB     1,500 mg 250 mL/hr over 120 Minutes Intravenous  Once 11/23/16 1238 11/23/16 1733   11/17/16 1600  DAPTOmycin (CUBICIN) 500 mg in sodium chloride 0.9 % IVPB  Status:  Discontinued     500 mg 220 mL/hr over 30 Minutes Intravenous Every 24 hours 11/17/16 1432 11/23/16 1238   11/17/16 1500  ceftaroline (TEFLARO) 600 mg in sodium chloride 0.9 % 250 mL IVPB  Status:  Discontinued     600 mg 250 mL/hr over 60 Minutes Intravenous Every 12 hours 11/17/16 1435 11/23/16 1238   11/15/16 1800  ceftaroline (TEFLARO) 600 mg in sodium chloride 0.9 % 250 mL IVPB  Status:  Discontinued     600 mg 250 mL/hr over 60 Minutes Intravenous Every 12 hours 11/15/16 1635 11/17/16 1431   11/13/16 1215  vancomycin (VANCOCIN) IVPB 1000 mg/200 mL premix  Status:  Discontinued     1,000 mg 200 mL/hr over 60 Minutes  Intravenous Every 8 hours 11/13/16 1203 11/16/16 0850   11/11/16 1100  vancomycin (VANCOCIN) IVPB 750 mg/150 ml premix  Status:  Discontinued     750 mg 150 mL/hr over 60 Minutes Intravenous Every 8 hours 11/11/16 0956 11/13/16 1203   11/11/16 0400  vancomycin (VANCOCIN) 500 mg in sodium chloride 0.9 % 100 mL IVPB  Status:  Discontinued     500 mg 100 mL/hr over 60 Minutes Intravenous Every 12 hours 11/10/16 1951 11/11/16 0956   11/10/16 2200  ceFEPIme (MAXIPIME) 1 g in dextrose 5 % 50 mL IVPB  Status:  Discontinued     1 g 100 mL/hr over 30 Minutes Intravenous Every 12 hours 11/10/16 1951 11/11/16 0926   11/10/16 2000  ceFEPIme (MAXIPIME) 2 g in dextrose 5 % 50 mL IVPB  Status:  Discontinued     2 g 100 mL/hr over 30 Minutes Intravenous  Once 11/10/16 1952 11/10/16 1954   11/10/16 2000  vancomycin (VANCOCIN) IVPB 1000 mg/200 mL premix  Status:  Discontinued     1,000 mg 200 mL/hr over 60 Minutes Intravenous  Once 11/10/16 1952 11/10/16 1954   11/10/16 1430  piperacillin-tazobactam (ZOSYN) IVPB 3.375 g     3.375 g 100 mL/hr over 30 Minutes Intravenous  Once 11/10/16 1426 11/10/16 1503   11/10/16 1430  vancomycin (VANCOCIN) IVPB 1000 mg/200 mL premix     1,000 mg 200 mL/hr over 60 Minutes Intravenous  Once 11/10/16 1426 11/10/16 1653        Subjective:   Savannah Devon was seen and examined today. No complaints except feeling more anxious today. Wants to restart buspar back. No fevers or chills. Patient denies dizziness,  shortness of breath, abdominal pain, N/V/D/C, new weakness, numbess, tingling. No acute events overnight.    Objective:   Vitals:   12/01/16 0703 12/01/16 2154 12/02/16 0515 12/02/16 0900  BP: (!) 139/91 139/88 (!) 145/82   Pulse: 65 75 (!) 49   Resp: 17 18 18    Temp: 98.8 F (37.1 C) 98.4 F (36.9 C) 98.5 F (36.9 C)   TempSrc:  Oral    SpO2: 95% 97% 97%   Weight:   60.1 kg (132 lb 7.9 oz) 59.2 kg (130 lb 8 oz)  Height:        Intake/Output Summary  (Last 24 hours) at 12/02/16 1312 Last data  filed at 12/02/16 1100  Gross per 24 hour  Intake             1050 ml  Output                0 ml  Net             1050 ml     Wt Readings from Last 3 Encounters:  12/02/16 59.2 kg (130 lb 8 oz)  03/14/16 58.5 kg (129 lb)  01/19/16 58.1 kg (128 lb)     Exam  General: Alert and oriented x 3, NAD  Eyes: EOMI PERRLA  HEENT: Normocephalic atraumatic  Cardiovascular: S1 and S2 clear, RRR systolic murmur in the tricuspid area   Respiratory: CTA B  Gastrointestinal: Soft, NT, ND, NBS  Ext: no pedal edema bilaterally  Neuro: no FND  Musculoskeletal: No cyanosis, clubbing  Skin: No rashes  Psych: slightly anxious, alert and oriented 3   Data Reviewed:  I have personally reviewed following labs and imaging studies  Micro Results No results found for this or any previous visit (from the past 240 hour(s)).  Radiology Reports Dg Chest 2 View  Result Date: 11/23/2016 CLINICAL DATA:  Chest tube removal EXAM: CHEST  2 VIEW COMPARISON:  11/21/2016 FINDINGS: Interval removal of right chest tube with residual small right pleural effusion versus pleural thickening. No pneumothorax. Patchy bilateral airspace opacities are again noted, unchanged. Heart is borderline in size. IMPRESSION: Interval removal of right chest tube without pneumothorax. Otherwise no change. Electronically Signed   By: Charlett Nose M.D.   On: 11/23/2016 08:44   Dg Chest 2 View  Result Date: 11/21/2016 CLINICAL DATA:  Chest soreness.  Chest tube . EXAM: CHEST  2 VIEW COMPARISON:  CT 11/20/2016.  Chest x-ray 11/20/2016 FINDINGS: Chest tube noted on the right. Tiny right anterior hydropneumothorax again noted. No interim change . Stable cardiomegaly. Persistent bilateral pulmonary nodules including cavitary nodules. Reference is made to prior CT report. Small right pleural effusion. No pneumothorax. IMPRESSION: 1. Right chest tube in stable position. Tiny right anterior  hydropneumothorax again noted. No interim change. 2. Multiple bilateral pulmonary nodules with cavitation. Reference is made to prior CT report of 11/20/2016 . Electronically Signed   By: Maisie Fus  Register   On: 11/21/2016 11:15   Dg Chest 2 View  Result Date: 11/19/2016 CLINICAL DATA:  Pneumothorax. EXAM: CHEST  2 VIEW COMPARISON:  11/19/2016. FINDINGS: Right chest tube in stable position. No significant pneumothorax noted on today's exam. Small right pleural effusion. Bilateral patchy pulmonary infiltrates with cavitation again noted. Small left pleural effusion noted. Heart size stable. No acute bony abnormality . IMPRESSION: 1. Right chest tube in stable position. No pneumothorax noted on today's exam. Small right pleural effusion. 2. Persistent bilateral cavitary pulmonary infiltrates. Small left pleural effusion. Electronically Signed   By: Maisie Fus  Register   On: 11/19/2016 16:53   Dg Chest 2 View  Result Date: 11/10/2016 CLINICAL DATA:  Chest pain, shortness of breath for 1 week EXAM: CHEST  2 VIEW COMPARISON:  CT chest 03/12/2016, chest x-ray 03/12/2016 FINDINGS: There are bilateral nodular airspace opacities throughout the upper and lower lobes bilaterally. There is no pleural effusion or pneumothorax. The heart and mediastinal contours are unremarkable. The osseous structures are unremarkable. IMPRESSION: New bilateral nodular airspace opacities throughout the upper and lower lobes bilaterally. Findings are concerning for multifocal infection such as septic emboli. Recommend further evaluation with a CT of the chest with intravenous contrast. Electronically Signed  By: Elige Ko   On: 11/10/2016 13:23   Ct Chest Wo Contrast  Result Date: 11/20/2016 CLINICAL DATA:  Follow-up right pleural effusion EXAM: CT CHEST WITHOUT CONTRAST TECHNIQUE: Multidetector CT imaging of the chest was performed following the standard protocol without IV contrast. COMPARISON:  Radiograph 11/20/2016, CT chest  11/10/2016, prior radiographs dating back to 11/10/2016 FINDINGS: Cardiovascular: Limited assessment without intravenous contrast. Small fluid in the superior pericardial recess. There is cardiomegaly. Mediastinum/Nodes: Difficult evaluation without contrast. Mediastinal adenopathy is present right paratracheal soft tissue fullness, possible adenopathy does not appear significantly changed. Midline trachea. Esophagus grossly unremarkable. Lungs/Pleura: Interim placement of a right lower lateral chest tube since the prior CT with the tip terminating along the right upper posterior pleural surface. Small bilateral pleural effusions right greater than left, slightly increased compared to the previous CT. Tiny right anterior hydropneumothorax. Multiple bilateral pulmonary nodules and cavitary lesions. Some of the cavitary lesions have decreased, however if there appears to be increased nodules/disease most notable in the left upper lobe and the right lower lobe. Decreased septal thickening compared to the previous exam. Upper Abdomen: Spleen appears enlarged. Partially visualized surgical clips in the gallbladder fossa. Musculoskeletal: No acute or suspicious bone lesion. IMPRESSION: 1. Interim placement of right-sided chest tube. Small residual pleural effusions right greater than left, mildly increased compared to the prior chest CT. Small right anterior hydropneumothorax. 2. Innumerable bilateral lung nodules and cavitary lesions, suspected to represent septic emboli. This appears increased in the left upper and right lower lobe since the prior CT. 3. Suspect mediastinal adenopathy although evaluation of the mediastinum is limited without contrast 4. Splenomegaly Electronically Signed   By: Jasmine Pang M.D.   On: 11/20/2016 21:25   Ct Chest Wo Contrast  Result Date: 11/10/2016 CLINICAL DATA:  She c/o some chest discomfrot and shortness of breath x 1 week. She states these are similar s/s she had a year ago, at  which time she was dx with "pulmonary abscess". She is in no distress. IV drug user EXAM: CT CHEST WITHOUT CONTRAST TECHNIQUE: Multidetector CT imaging of the chest was performed following the standard protocol without IV contrast. COMPARISON:  Chest CT angiogram dated 03/12/2016. FINDINGS: Cardiovascular: Probable small pericardial effusion and/or pericardial thickening, difficult to characterize without IV contrast. Heart size is within normal limits. Thoracic aorta appears to be normal in caliber. Mediastinum/Nodes: Soft tissue density material within the right upper peritracheal space, also difficult to characterize without IV contrast, presumed lymphadenopathy. Additional mild lymphadenopathy within the anterior mediastinum and aortopulmonary window region. Lungs/Pleura: Numerous cavitary consolidations throughout both lungs, peripherally distributed suggesting septic emboli. More confluent consolidations at each lung base, more likely atelectasis than pneumonia. Small right pleural effusion. Upper Abdomen: Suspected splenomegaly, incompletely imaged. Musculoskeletal: No acute or suspicious osseous finding. IMPRESSION: 1. Numerous cavitary consolidations throughout both lungs, with a peripheral distribution suggesting septic emboli. Largest cavitary consolidation is within the left lower lobe measuring approximately 4 cm greatest dimension. 2. Probable small pericardial effusion and/or pericardial wall thickening, difficult to delineate without IV contrast. 3. Probable mediastinal lymphadenopathy, again difficult to definitively characterize without IV contrast, alternatively confluent fluid or edema within the mediastinum. 4. Small right pleural effusion. 5. Probable splenomegaly, incompletely imaged at the lower aspects of this chest CT. 6. Right IJ line in place with tip passing through the right atrium and into the right ventricle. Recommend retracting to the cavoatrial junction for optimal radiographic  positioning. These results and recommendations were called by telephone at the  time of interpretation on 11/10/2016 at 7:35 pm to Dr. Katrinka Blazing , who verbally acknowledged these results. Electronically Signed   By: Bary Richard M.D.   On: 11/10/2016 19:36   Ct Abdomen Pelvis W Contrast  Result Date: 11/14/2016 CLINICAL DATA:  Right upper quadrant pain times 2-3 days. Polysubstance abuse. Cholecystectomy. History of endocarditis and septic emboli. EXAM: CT ABDOMEN AND PELVIS WITH CONTRAST TECHNIQUE: Multidetector CT imaging of the abdomen and pelvis was performed using the standard protocol following bolus administration of intravenous contrast. CONTRAST:  80mL ISOVUE-300 IOPAMIDOL (ISOVUE-300) INJECTION 61% COMPARISON:  03/12/2016 chest CT FINDINGS: Lower chest: Small right effusion with several bilateral cavitary opacities in both lower lobes concerning for septic emboli or cavitary pneumonia. More confluent airspace opacity in the left lower lobe is also noted. The visualized heart is normal in size. There is no pericardial effusion. No large central pulmonary embolus. Hepatobiliary: Normal appearance of the liver without space-occupying mass or biliary dilatation. Cholecystectomy. Pancreas: No pancreatic mass or ductal dilatation. Mild enlargement of the pancreatic head with surrounding peripancreatic fluid is noted. Correlate to exclude a pancreatitis. Spleen: The spleen is enlarged measuring 15.7 x 5.6 x 17 cm (volume = 780 cm^3) and demonstrates a developmental cleft, partially visualized on prior chest CT and therefore believed less likely to represent a laceration. Adrenals/Urinary Tract: Adrenal glands are unremarkable. Kidneys are normal, without renal calculi, solid appearing lesions, or hydronephrosis. There appear to be pair of water attenuating cysts in the lower pole the right kidney measuring up to 2 x 1.3 cm in aggregate. Bladder is unremarkable. Stomach/Bowel: Stomach is within normal limits.  Appendix appears normal. No evidence of bowel wall thickening, distention, or inflammatory changes. Vascular/Lymphatic: No significant vascular findings are present. No enlarged abdominal or pelvic lymph nodes. Reproductive: Uterus and bilateral adnexa are unremarkable. Other: Moderate volume of ascites.  Anasarca. Musculoskeletal: No acute or significant osseous findings. IMPRESSION: 1. Bibasilar, multifocal cavitary lesions noted within the visualized lower lobes consistent with septic emboli or possibly cavitary pneumonia. More confluent airspace disease in the left lower lobe without cavitation is also present. There is a small right pleural effusion. 2. Anasarca with moderate volume of ascites noted within the abdomen and pelvis. Question right heart dysfunction/failure. 3. Splenomegaly. 4. Water attenuating cysts in the lower pole the right kidney in aggregate measuring 2 x 1.3 cm. 5. Status post cholecystectomy. Electronically Signed   By: Tollie Eth M.D.   On: 11/14/2016 16:26   Dg Chest 1v Repeat Same Day  Result Date: 11/18/2016 CLINICAL DATA:  Chest tube placement EXAM: CHEST - 1 VIEW SAME DAY COMPARISON:  Earlier same day FINDINGS: Large bore chest tube placed on the right along the lateral margin. Marked reduction an amount of right pleural air, nearly completely evacuated. Patchy infiltrates persist in both lower lobes. IMPRESSION: Large bore chest tube placed. Near complete resolution of right pneumothorax. Electronically Signed   By: Paulina Fusi M.D.   On: 11/18/2016 11:40   Dg Chest Port 1 View  Result Date: 11/20/2016 CLINICAL DATA:  26 year old female with pneumothorax. Recent transesophageal echocardiogram. Subsequent encounter. EXAM: PORTABLE CHEST 1 VIEW COMPARISON:  11/20/2016 8:30 a.m. FINDINGS: Right-sided chest tube remains in place. No pneumothorax detected on this frontal projection. Cavitary lesions bilaterally. Left base consolidation. Atelectasis/pleural thickening right lung  base. Cardiomegaly. IMPRESSION: No significant change since exam earlier today. Electronically Signed   By: Lacy Duverney M.D.   On: 11/20/2016 13:55   Dg Chest [facility] 1 View  Result  Date: 11/20/2016 CLINICAL DATA:  Chest tube, shortness of Breath EXAM: PORTABLE CHEST 1 VIEW COMPARISON:  11/19/2016 FINDINGS: Right chest tube remains in place, unchanged. No pneumothorax. Patchy bilateral airspace opacities are again noted, some with cavitation. Small right pleural effusion. Heart is borderline in size. IMPRESSION: Right chest tube remains in place without pneumothorax. Stable patchy bilateral airspace disease, some of which is cavitary. Electronically Signed   By: Charlett Nose M.D.   On: 11/20/2016 08:42   Dg Chest Port 1 View  Result Date: 11/19/2016 CLINICAL DATA:  26 year old female with cavitary lung lesions possibly from septic endocarditis. Subsequent encounter. EXAM: PORTABLE CHEST 1 VIEW COMPARISON:  11/18/2016. FINDINGS: Right-sided chest tube remains in place. Tiny hydropneumothorax versus fluid within minor fissure noted. Multiple cavitary lesions. Right-sided pleural effusion. Basilar atelectasis versus infiltrate greater on the left. Cardiomegaly. Mild scoliosis. IMPRESSION: Right-sided chest tube remains in place. Tiny hydropneumothorax versus fluid within minor fissure. Multiple cavitary lesions. Right-sided pleural effusion. Basilar atelectasis versus infiltrate greater on the left. Electronically Signed   By: Lacy Duverney M.D.   On: 11/19/2016 07:16   Dg Chest Port 1 View  Result Date: 11/18/2016 CLINICAL DATA:  Followup left pneumothorax. Patient with bacterial endocarditis. EXAM: PORTABLE CHEST 1 VIEW COMPARISON:  11/17/2016 and prior studies FINDINGS: A moderate right hydropneumothorax is unchanged in size, but now with pleural fluid in its basilar portion. A right thoracostomy tube is unchanged. Scattered pulmonary opacities/ nodules and left lower lung consolidations/atelectasis again  noted. There has been no other interval change. IMPRESSION: Moderate right hydropneumothorax, unchanged in size but now with pleural fluid in its basilar portion. Right thoracostomy tube remains unchanged. Electronically Signed   By: Harmon Pier M.D.   On: 11/18/2016 10:14   Dg Chest Port 1 View  Result Date: 11/17/2016 CLINICAL DATA:  Follow-up right-sided chest tube placement. Initial encounter. EXAM: PORTABLE CHEST 1 VIEW COMPARISON:  Chest radiograph performed 11/16/2016 FINDINGS: The patient's moderate right-sided pneumothorax has decreased in size. The right-sided chest tube is grossly unchanged in appearance. Vascular congestion is noted. Patchy left-sided airspace opacities raise concern for pneumonia. Right-sided airspace opacity may reflect atelectasis. A small left pleural effusion is noted. The cardiomediastinal silhouette is mildly enlarged. No acute osseous abnormalities are identified. IMPRESSION: 1. Moderate right-sided pneumothorax has decreased in size. Right-sided chest tube is grossly unchanged in appearance. 2. Vascular congestion and mild cardiomegaly. Patchy left-sided airspace opacities raise concern for pneumonia. Small left pleural effusion noted. 3. Right-sided airspace opacity may reflect atelectasis. Electronically Signed   By: Roanna Raider M.D.   On: 11/17/2016 00:33   Dg Chest Port 1 View  Result Date: 11/16/2016 CLINICAL DATA:  Chest tube placement for right pneumothorax. EXAM: PORTABLE CHEST 1 VIEW COMPARISON:  Single-view of the chest earlier today and 11/11/2016. FINDINGS: Pigtail catheter is now in place in the right chest. Large right pneumothorax is unchanged. Patchy airspace disease in cavitary lesions in the left chest persist. Heart size is normal. IMPRESSION: No change in large right pneumothorax after chest tube placement. No change in patchy airspace disease in cavitary lesions on the left. These results were called by telephone at the time of interpretation on  11/16/2016 at 8:10 pm to Estrella Deeds, RN, who verbally acknowledged these results. Electronically Signed   By: Drusilla Kanner M.D.   On: 11/16/2016 20:12   Dg Chest Port 1 View  Result Date: 11/16/2016 CLINICAL DATA:  Shortness of Breath EXAM: PORTABLE CHEST 1 VIEW COMPARISON:  11/11/2016 FINDINGS: Cardiac shadow  is within normal limits. Cavitary nodular densities are noted throughout both lungs similar to that seen on previous CT examination. A new right-sided moderately large pneumothorax is noted with consolidation of the lower lobe on the right. No acute bony abnormality is noted. IMPRESSION: Moderately large right-sided pneumothorax new from the prior exam. Near complete collapse of the right lower lobe is noted. Stable cavitary lesions within both lungs. Critical Value/emergent results were called by telephone at the time of interpretation on 11/16/2016 at 6:24 pm to Dr Leitha Bleak, who verbally acknowledged these results. Electronically Signed   By: Alcide Clever M.D.   On: 11/16/2016 18:27   Dg Chest Port 1 View  Result Date: 11/11/2016 CLINICAL DATA:  Check right IJ line placement.  Initial encounter. EXAM: PORTABLE CHEST 1 VIEW COMPARISON:  Chest radiograph performed 11/10/2016 FINDINGS: The right IJ line is noted ending at the right atrium. This could be retracted 2-3 cm. Mild vascular congestion is noted. Mild left-sided opacities may reflect atelectasis or mild pneumonia. No pleural effusion or pneumothorax is seen. The cardiomediastinal silhouette is borderline normal in size. No acute osseous abnormalities are identified. IMPRESSION: 1. Right IJ line noted ending at the right atrium. This could be retracted 2-3 cm, as deemed clinically appropriate. 2. Mild vascular congestion noted. 3. Mild left-sided airspace opacities may reflect atelectasis or mild pneumonia. Electronically Signed   By: Roanna Raider M.D.   On: 11/11/2016 00:37   Dg Chest Port 1 View  Result Date: 11/10/2016 CLINICAL DATA:   Retraction of the IJ line about 10 cm EXAM: PORTABLE CHEST 1 VIEW COMPARISON:  CT chest 11/10/2016.  Chest 11/10/2016. FINDINGS: The left central venous catheter tip localizes to the mid/distal right atrial region. If placement at or above the cavoatrial junction is desired, retraction of about 3-4 cm is recommended. No pneumothorax. Shallow inspiration with atelectasis in the lung bases. Borderline heart size. Patchy airspace infiltrates in both lungs could indicate edema, aspiration, or pneumonia. No blunting of costophrenic angles. IMPRESSION: Central venous catheter tip is in the mid/ low right atrial region. If placement at or above the cavoatrial junction is desired, retraction of about 3-4 cm is recommended. Electronically Signed   By: Burman Nieves M.D.   On: 11/10/2016 21:35   Dg Chest Portable 1 View  Result Date: 11/10/2016 CLINICAL DATA:  Status post central line placement. EXAM: PORTABLE CHEST 1 VIEW COMPARISON:  Chest x-ray from earlier same day. FINDINGS: Interval placement of a right IJ central line with tip passing to the left of midline and likely in the right ventricle or main pulmonary artery. The bilateral small nodular airspace opacities throughout both lungs are unchanged in the short-term interval. Suspect small bilateral pleural effusions. No pneumothorax seen. IMPRESSION: 1. Right IJ central line placement with tip positioned to the left of midline either within the right ventricle or main pulmonary artery. Recommend retracting at least 10 cm. 2. Bilateral small nodular airspace opacities throughout both lungs are unchanged in the short-term interval. As recommended on earlier chest x-ray report, would consider chest CT for further characterization. 3. Suspect small bilateral pleural effusions. Electronically Signed   By: Bary Richard M.D.   On: 11/10/2016 18:37    Lab Data:  CBC:  Recent Labs Lab 11/26/16 0612 11/28/16 0405 11/29/16 0409 11/30/16 0440  WBC 5.5 6.8 6.6  7.6  HGB 9.5* 9.7* 9.4* 9.9*  HCT 29.2* 30.4* 29.4* 31.2*  MCV 87.7 86.6 87.5 87.6  PLT 326 329 317 327   Basic  Metabolic Panel:  Recent Labs Lab 11/26/16 0612 11/27/16 0400 11/28/16 0405 11/29/16 0409 11/30/16 0440 12/01/16 0349 12/02/16 0432  NA 139 138 138  --   --  138  --   K 3.6 3.3* 3.5  --   --  3.4*  --   CL 107 104 105  --   --  104  --   CO2 24 27 26   --   --  28  --   GLUCOSE 86 94 98  --   --  95  --   BUN 10 6 8   --   --  10  --   CREATININE 0.92  0.95 0.79 0.78 0.71 0.78 0.68 0.63  CALCIUM 8.3* 8.4* 8.0*  --   --  8.5*  --    GFR: Estimated Creatinine Clearance: 88.2 mL/min (by C-G formula based on SCr of 0.63 mg/dL). Liver Function Tests: No results for input(s): AST, ALT, ALKPHOS, BILITOT, PROT, ALBUMIN in the last 168 hours. No results for input(s): LIPASE, AMYLASE in the last 168 hours. No results for input(s): AMMONIA in the last 168 hours. Coagulation Profile: No results for input(s): INR, PROTIME in the last 168 hours. Cardiac Enzymes: No results for input(s): CKTOTAL, CKMB, CKMBINDEX, TROPONINI in the last 168 hours. BNP (last 3 results) No results for input(s): PROBNP in the last 8760 hours. HbA1C: No results for input(s): HGBA1C in the last 72 hours. CBG: No results for input(s): GLUCAP in the last 168 hours. Lipid Profile: No results for input(s): CHOL, HDL, LDLCALC, TRIG, CHOLHDL, LDLDIRECT in the last 72 hours. Thyroid Function Tests: No results for input(s): TSH, T4TOTAL, FREET4, T3FREE, THYROIDAB in the last 72 hours. Anemia Panel: No results for input(s): VITAMINB12, FOLATE, FERRITIN, TIBC, IRON, RETICCTPCT in the last 72 hours. Urine analysis:    Component Value Date/Time   COLORURINE YELLOW 11/10/2016 2132   APPEARANCEUR CLEAR 11/10/2016 2132   LABSPEC 1.006 11/10/2016 2132   PHURINE 5.0 11/10/2016 2132   GLUCOSEU NEGATIVE 11/10/2016 2132   HGBUR MODERATE (A) 11/10/2016 2132   BILIRUBINUR NEGATIVE 11/10/2016 2132   KETONESUR  NEGATIVE 11/10/2016 2132   PROTEINUR NEGATIVE 11/10/2016 2132   UROBILINOGEN 0.2 08/05/2010 1821   NITRITE NEGATIVE 11/10/2016 2132   LEUKOCYTESUR SMALL (A) 11/10/2016 2132     Juandedios Dudash M.D. [facility] 12/02/2016, 1:12 PM  Pager: [phone] Between 7am to 7pm - call Pager - [phone]  After 7pm go to [email or web address] - password [email or web address]  Call night coverage person covering after 7pm            [facility]                                                                              Patient Demographics  Savannah Palmer, is a 26 y.o. female, DOB - Jan 12, 1991, ION:[id number]  Admit date - 11/10/2016   Admitting Physician Coralyn HellingVineet Sood, MD  Outpatient Primary MD for the patient is Patient, No Pcp Per  Outpatient specialists:   LOS - 22  days   Medical records reviewed and are as summarized below:    No chief complaint on file.      Brief summary    26 year old Caucasian female with  a past medical history of anxiety, depression, heroin abuse, presented with weakness, cough and chest pain. Initially was in septic shock and required pressors. Blood cultures grew MRSA. 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 She was placed on IV antibiotics. She also developed a spontaneous right-sided pneumothorax. Chest tube was placed by critical care medicine. She was transferred to [facility] for opinion from cardiothoracic surgery. Patient is not a candidate for surgery at this time. Remains stable on IV Vancomycin  Assessment & Plan    MRSA bacteremia/Septic shock with right TV endocarditis and septic emboli to lungs/MRSA empyema -Admitted with septic shock to ICU requiring vasopressors -Sepsis resolved -Blood cultures 5/27 grew MRSA and found to have septic pulmonary emboli -Repeat cultures subsequently positive but have been negative since 6/4  -2-D echo on 5/27 with tricuspid valve  vegetations, moderate TR, TEE on 6/1 showed large tricuspid valve vegetation with severe TR and dilated RV  - Due to persistent bacteremia patient was started on daptomycin 6/2 and Ceftaroline continued to cover lung infection. - cardiothoracic surgery was also consulted, not a candidate for surgery at this time.  - Per ID recommendations, stopped Ceftaroline and daptomycin, use vancomycin alone, duration through July 15, will need BMP, CBC weekly, trough goal of 15-20.  -ID follow-up to be arranged -she has been on suboxone intermittently for 2years, very reluctant to come off this now since it helps her significantly with cravings/risk of heroin relapse if she stops this  Acute respiratory failure/spontaneous right pneumothorax/MRSA empyema -Followed by CVTS, status post chest tube drainage, improved -Chest tube removed 6/7 -Stable now and repeat chest x-rays clear  Acute kidney injury -Secondary to NSAIDs and dehydration -Resolved  Ascites, anasarca due to right sided hear failure - as a result of severe TR, net positive balance of 13.7 L - limit IVF, hold off on administering diuretics in setting of sepsis and borderline BPs, O2 sats 96% on room air - resume Po Lasix 20 mg daily  Splenomegaly - etiology undetermined- CT on 5/30 revealed that the spleen is enlarged measuring 15.7 x 5.6 x 17 cm (volume= 780 cm^3) - liver is unremarkable on CT - Needs follow-up   Normocytic Anemia - H&H currently stable, was transfused on 6/5, no overt bleeding noted - Iron was 12. Ferritin 213. B-12 1850. Folate 7.8.  Hypokalemia/Hypophosphatemia/Hypomagnesemia - Resolved  Thrombocytopenia - Resolved  Heroin abuse - cont Suboxone, reluctant to come off this and risk of heroin relapse  Anxiety - cont Buspar, Neurontin, Xanax  Nicotine dependence -Continue  Nicoderm patch   Code Status: Full CODE STATUS  DVT Prophylaxis:  heparin  Family Communication: Mother at  bedside Disposition Plan: to be determined  Time Spent in minutes  25 minutes  Procedures:  TEE  - Left ventricle: The cavity size was normal. Wall thickness was normal. Systolic function was normal. The estimated ejection fraction was in the range of 55% to 60%. Wall motion was normal; there were no regional wall motion abnormalities. - Left atrium: No evidence of thrombus in the atrial cavity or appendage. - Right ventricle: The cavity size was mildly dilated. Wall thickness was normal. - Right atrium: No evidence of thrombus in the atrial cavity or appendage. - Tricuspid valve: Flail motion of the posterior leaflet. There is a 8x5 mm vegetation attached to the posterior leaflet, but most of the &quot;mass&quot; described on transthoracic imaging appears to be the flail segment  of the valve. There was severe regurgitation directed eccentrically and toward the free wall.  Right chest tube placement for pneumothorax 6/1  Placement of a larger bore chest tube in the right chest 6/3  Consultants:   Infectious disease Cardiothoracic surgery  Antimicrobials:   Daptomycin 6/2> 6/8  Teflaro 5/31> 6/8  Vancomycin 6/8>> will need to July 15   Medications  Scheduled Meds: . acetaminophen  1,000 mg Oral Q6H  . buprenorphine-naloxone  2 tablet Sublingual Daily  . busPIRone  10 mg Oral BID  . furosemide  20 mg Oral Daily  . gabapentin  300 mg Oral TID  . heparin  5,000 Units Subcutaneous Q8H  . nicotine  14 mg Transdermal Daily  . potassium chloride  40 mEq Oral Daily  . saccharomyces boulardii  250 mg Oral BID   Continuous Infusions: . sodium chloride 250 mL (11/28/16 2032)  . vancomycin Stopped (12/02/16 1031)   PRN Meds:.sodium chloride, albuterol, ALPRAZolam, docusate sodium, ibuprofen, nicotine polacrilex, ondansetron (ZOFRAN) IV, oxyCODONE, sodium chloride flush   Antibiotics   Anti-infectives    Start     Dose/Rate Route Frequency  Ordered Stop   11/29/16 1000  vancomycin (VANCOCIN) IVPB 750 mg/150 ml premix     750 mg 150 mL/hr over 60 Minutes Intravenous Every 12 hours 11/29/16 0934     11/26/16 2000  vancomycin (VANCOCIN) IVPB 1000 mg/200 mL premix  Status:  Discontinued     1,000 mg 200 mL/hr over 60 Minutes Intravenous Every 12 hours 11/26/16 1912 11/29/16 0853   11/24/16 0000  vancomycin (VANCOCIN) IVPB 1000 mg/200 mL premix  Status:  Discontinued     1,000 mg 200 mL/hr over 60 Minutes Intravenous Every 8 hours 11/23/16 1238 11/26/16 0721   11/23/16 1600  vancomycin (VANCOCIN) 1,500 mg in sodium chloride 0.9 % 500 mL IVPB     1,500 mg 250 mL/hr over 120 Minutes Intravenous  Once 11/23/16 1238 11/23/16 1733   11/17/16 1600  DAPTOmycin (CUBICIN) 500 mg in sodium chloride 0.9 % IVPB  Status:  Discontinued     500 mg 220 mL/hr over 30 Minutes Intravenous Every 24 hours 11/17/16 1432 11/23/16 1238   11/17/16 1500  ceftaroline (TEFLARO) 600 mg in sodium chloride 0.9 % 250 mL IVPB  Status:  Discontinued     600 mg 250 mL/hr over 60 Minutes Intravenous Every 12 hours 11/17/16 1435 11/23/16 1238   11/15/16 1800  ceftaroline (TEFLARO) 600 mg in sodium chloride 0.9 % 250 mL IVPB  Status:  Discontinued     600 mg 250 mL/hr over 60 Minutes Intravenous Every 12 hours 11/15/16 1635 11/17/16 1431   11/13/16 1215  vancomycin (VANCOCIN) IVPB 1000 mg/200 mL premix  Status:  Discontinued     1,000 mg 200 mL/hr over 60 Minutes Intravenous Every 8 hours 11/13/16 1203 11/16/16 0850   11/11/16 1100  vancomycin (VANCOCIN) IVPB 750 mg/150 ml premix  Status:  Discontinued     750 mg 150 mL/hr over 60 Minutes Intravenous Every 8 hours 11/11/16 0956 11/13/16 1203   11/11/16 0400  vancomycin (VANCOCIN) 500 mg in sodium chloride 0.9 % 100 mL IVPB  Status:  Discontinued     500 mg 100 mL/hr over 60 Minutes Intravenous Every 12 hours 11/10/16 1951 11/11/16 0956   11/10/16 2200  ceFEPIme (MAXIPIME) 1 g in dextrose 5 % 50 mL IVPB  Status:   Discontinued     1 g 100 mL/hr over 30 Minutes Intravenous Every 12 hours 11/10/16 1951 11/11/16  1610   11/10/16 2000  ceFEPIme (MAXIPIME) 2 g in dextrose 5 % 50 mL IVPB  Status:  Discontinued     2 g 100 mL/hr over 30 Minutes Intravenous  Once 11/10/16 1952 11/10/16 1954   11/10/16 2000  vancomycin (VANCOCIN) IVPB 1000 mg/200 mL premix  Status:  Discontinued     1,000 mg 200 mL/hr over 60 Minutes Intravenous  Once 11/10/16 1952 11/10/16 1954   11/10/16 1430  piperacillin-tazobactam (ZOSYN) IVPB 3.375 g     3.375 g 100 mL/hr over 30 Minutes Intravenous  Once 11/10/16 1426 11/10/16 1503   11/10/16 1430  vancomycin (VANCOCIN) IVPB 1000 mg/200 mL premix     1,000 mg 200 mL/hr over 60 Minutes Intravenous  Once 11/10/16 1426 11/10/16 1653        Subjective:  -c/o abd bloating  Objective:   Vitals:   12/01/16 0703 12/01/16 2154 12/02/16 0515 12/02/16 0900  BP: (!) 139/91 139/88 (!) 145/82   Pulse: 65 75 (!) 49   Resp: 17 18 18    Temp: 98.8 F (37.1 C) 98.4 F (36.9 C) 98.5 F (36.9 C)   TempSrc:  Oral    SpO2: 95% 97% 97%   Weight:   60.1 kg (132 lb 7.9 oz) 59.2 kg (130 lb 8 oz)  Height:        Intake/Output Summary (Last 24 hours) at 12/02/16 1312 Last data filed at 12/02/16 1100  Gross per 24 hour  Intake             1050 ml  Output                0 ml  Net             1050 ml     Wt Readings from Last 3 Encounters:  12/02/16 59.2 kg (130 lb 8 oz)  03/14/16 58.5 kg (129 lb)  01/19/16 58.1 kg (128 lb)     Exam  Gen: Awake, Alert, Oriented X 3,  HEENT: PERRLA, Neck supple, no JVD Lungs: Good air movement bilaterally, CTAB CVS: RRR, grade 2 systolic murmur Abd: soft, Non tender, non distended, BS present Extremities: trace edema Skin: no new rashes  Data Reviewed:  I have personally reviewed following labs and imaging studies  Micro Results No results found for this or any previous visit (from the past 240 hour(s)).  Radiology Reports Dg Chest 2  View  Result Date: 11/23/2016 CLINICAL DATA:  Chest tube removal EXAM: CHEST  2 VIEW COMPARISON:  11/21/2016 FINDINGS: Interval removal of right chest tube with residual small right pleural effusion versus pleural thickening. No pneumothorax. Patchy bilateral airspace opacities are again noted, unchanged. Heart is borderline in size. IMPRESSION: Interval removal of right chest tube without pneumothorax. Otherwise no change. Electronically Signed   By: Charlett Nose M.D.   On: 11/23/2016 08:44   Dg Chest 2 View  Result Date: 11/21/2016 CLINICAL DATA:  Chest soreness.  Chest tube . EXAM: CHEST  2 VIEW COMPARISON:  CT 11/20/2016.  Chest x-ray 11/20/2016 FINDINGS: Chest tube noted on the right. Tiny right anterior hydropneumothorax again noted. No interim change . Stable cardiomegaly. Persistent bilateral pulmonary nodules including cavitary nodules. Reference is made to prior CT report. Small right pleural effusion. No pneumothorax. IMPRESSION: 1. Right chest tube in stable position. Tiny right anterior hydropneumothorax again noted. No interim change. 2. Multiple bilateral pulmonary nodules with cavitation. Reference is made to prior CT report of 11/20/2016 . Electronically Signed   By:  Thomas  Register   On: 11/21/2016 11:15   Dg Chest 2 View  Result Date: 11/19/2016 CLINICAL DATA:  Pneumothorax. EXAM: CHEST  2 VIEW COMPARISON:  11/19/2016. FINDINGS: Right chest tube in stable position. No significant pneumothorax noted on today's exam. Small right pleural effusion. Bilateral patchy pulmonary infiltrates with cavitation again noted. Small left pleural effusion noted. Heart size stable. No acute bony abnormality . IMPRESSION: 1. Right chest tube in stable position. No pneumothorax noted on today's exam. Small right pleural effusion. 2. Persistent bilateral cavitary pulmonary infiltrates. Small left pleural effusion. Electronically Signed   By: Maisie Fus  Register   On: 11/19/2016 16:53   Dg Chest 2 View  Result  Date: 11/10/2016 CLINICAL DATA:  Chest pain, shortness of breath for 1 week EXAM: CHEST  2 VIEW COMPARISON:  CT chest 03/12/2016, chest x-ray 03/12/2016 FINDINGS: There are bilateral nodular airspace opacities throughout the upper and lower lobes bilaterally. There is no pleural effusion or pneumothorax. The heart and mediastinal contours are unremarkable. The osseous structures are unremarkable. IMPRESSION: New bilateral nodular airspace opacities throughout the upper and lower lobes bilaterally. Findings are concerning for multifocal infection such as septic emboli. Recommend further evaluation with a CT of the chest with intravenous contrast. Electronically Signed   By: Elige Ko   On: 11/10/2016 13:23   Ct Chest Wo Contrast  Result Date: 11/20/2016 CLINICAL DATA:  Follow-up right pleural effusion EXAM: CT CHEST WITHOUT CONTRAST TECHNIQUE: Multidetector CT imaging of the chest was performed following the standard protocol without IV contrast. COMPARISON:  Radiograph 11/20/2016, CT chest 11/10/2016, prior radiographs dating back to 11/10/2016 FINDINGS: Cardiovascular: Limited assessment without intravenous contrast. Small fluid in the superior pericardial recess. There is cardiomegaly. Mediastinum/Nodes: Difficult evaluation without contrast. Mediastinal adenopathy is present right paratracheal soft tissue fullness, possible adenopathy does not appear significantly changed. Midline trachea. Esophagus grossly unremarkable. Lungs/Pleura: Interim placement of a right lower lateral chest tube since the prior CT with the tip terminating along the right upper posterior pleural surface. Small bilateral pleural effusions right greater than left, slightly increased compared to the previous CT. Tiny right anterior hydropneumothorax. Multiple bilateral pulmonary nodules and cavitary lesions. Some of the cavitary lesions have decreased, however if there appears to be increased nodules/disease most notable in the left  upper lobe and the right lower lobe. Decreased septal thickening compared to the previous exam. Upper Abdomen: Spleen appears enlarged. Partially visualized surgical clips in the gallbladder fossa. Musculoskeletal: No acute or suspicious bone lesion. IMPRESSION: 1. Interim placement of right-sided chest tube. Small residual pleural effusions right greater than left, mildly increased compared to the prior chest CT. Small right anterior hydropneumothorax. 2. Innumerable bilateral lung nodules and cavitary lesions, suspected to represent septic emboli. This appears increased in the left upper and right lower lobe since the prior CT. 3. Suspect mediastinal adenopathy although evaluation of the mediastinum is limited without contrast 4. Splenomegaly Electronically Signed   By: Jasmine Pang M.D.   On: 11/20/2016 21:25   Ct Chest Wo Contrast  Result Date: 11/10/2016 CLINICAL DATA:  She c/o some chest discomfrot and shortness of breath x 1 week. She states these are similar s/s she had a year ago, at which time she was dx with "pulmonary abscess". She is in no distress. IV drug user EXAM: CT CHEST WITHOUT CONTRAST TECHNIQUE: Multidetector CT imaging of the chest was performed following the standard protocol without IV contrast. COMPARISON:  Chest CT angiogram dated 03/12/2016. FINDINGS: Cardiovascular: Probable small pericardial effusion and/or  pericardial thickening, difficult to characterize without IV contrast. Heart size is within normal limits. Thoracic aorta appears to be normal in caliber. Mediastinum/Nodes: Soft tissue density material within the right upper peritracheal space, also difficult to characterize without IV contrast, presumed lymphadenopathy. Additional mild lymphadenopathy within the anterior mediastinum and aortopulmonary window region. Lungs/Pleura: Numerous cavitary consolidations throughout both lungs, peripherally distributed suggesting septic emboli. More confluent consolidations at each lung  base, more likely atelectasis than pneumonia. Small right pleural effusion. Upper Abdomen: Suspected splenomegaly, incompletely imaged. Musculoskeletal: No acute or suspicious osseous finding. IMPRESSION: 1. Numerous cavitary consolidations throughout both lungs, with a peripheral distribution suggesting septic emboli. Largest cavitary consolidation is within the left lower lobe measuring approximately 4 cm greatest dimension. 2. Probable small pericardial effusion and/or pericardial wall thickening, difficult to delineate without IV contrast. 3. Probable mediastinal lymphadenopathy, again difficult to definitively characterize without IV contrast, alternatively confluent fluid or edema within the mediastinum. 4. Small right pleural effusion. 5. Probable splenomegaly, incompletely imaged at the lower aspects of this chest CT. 6. Right IJ line in place with tip passing through the right atrium and into the right ventricle. Recommend retracting to the cavoatrial junction for optimal radiographic positioning. These results and recommendations were called by telephone at the time of interpretation on 11/10/2016 at 7:35 pm to Dr. Katrinka Blazing , who verbally acknowledged these results. Electronically Signed   By: Bary Richard M.D.   On: 11/10/2016 19:36   Ct Abdomen Pelvis W Contrast  Result Date: 11/14/2016 CLINICAL DATA:  Right upper quadrant pain times 2-3 days. Polysubstance abuse. Cholecystectomy. History of endocarditis and septic emboli. EXAM: CT ABDOMEN AND PELVIS WITH CONTRAST TECHNIQUE: Multidetector CT imaging of the abdomen and pelvis was performed using the standard protocol following bolus administration of intravenous contrast. CONTRAST:  80mL ISOVUE-300 IOPAMIDOL (ISOVUE-300) INJECTION 61% COMPARISON:  03/12/2016 chest CT FINDINGS: Lower chest: Small right effusion with several bilateral cavitary opacities in both lower lobes concerning for septic emboli or cavitary pneumonia. More confluent airspace opacity  in the left lower lobe is also noted. The visualized heart is normal in size. There is no pericardial effusion. No large central pulmonary embolus. Hepatobiliary: Normal appearance of the liver without space-occupying mass or biliary dilatation. Cholecystectomy. Pancreas: No pancreatic mass or ductal dilatation. Mild enlargement of the pancreatic head with surrounding peripancreatic fluid is noted. Correlate to exclude a pancreatitis. Spleen: The spleen is enlarged measuring 15.7 x 5.6 x 17 cm (volume = 780 cm^3) and demonstrates a developmental cleft, partially visualized on prior chest CT and therefore believed less likely to represent a laceration. Adrenals/Urinary Tract: Adrenal glands are unremarkable. Kidneys are normal, without renal calculi, solid appearing lesions, or hydronephrosis. There appear to be pair of water attenuating cysts in the lower pole the right kidney measuring up to 2 x 1.3 cm in aggregate. Bladder is unremarkable. Stomach/Bowel: Stomach is within normal limits. Appendix appears normal. No evidence of bowel wall thickening, distention, or inflammatory changes. Vascular/Lymphatic: No significant vascular findings are present. No enlarged abdominal or pelvic lymph nodes. Reproductive: Uterus and bilateral adnexa are unremarkable. Other: Moderate volume of ascites.  Anasarca. Musculoskeletal: No acute or significant osseous findings. IMPRESSION: 1. Bibasilar, multifocal cavitary lesions noted within the visualized lower lobes consistent with septic emboli or possibly cavitary pneumonia. More confluent airspace disease in the left lower lobe without cavitation is also present. There is a small right pleural effusion. 2. Anasarca with moderate volume of ascites noted within the abdomen and pelvis. Question right heart dysfunction/failure.  3. Splenomegaly. 4. Water attenuating cysts in the lower pole the right kidney in aggregate measuring 2 x 1.3 cm. 5. Status post cholecystectomy.  Electronically Signed   By: Tollie Eth M.D.   On: 11/14/2016 16:26   Dg Chest 1v Repeat Same Day  Result Date: 11/18/2016 CLINICAL DATA:  Chest tube placement EXAM: CHEST - 1 VIEW SAME DAY COMPARISON:  Earlier same day FINDINGS: Large bore chest tube placed on the right along the lateral margin. Marked reduction an amount of right pleural air, nearly completely evacuated. Patchy infiltrates persist in both lower lobes. IMPRESSION: Large bore chest tube placed. Near complete resolution of right pneumothorax. Electronically Signed   By: Paulina Fusi M.D.   On: 11/18/2016 11:40   Dg Chest Port 1 View  Result Date: 11/20/2016 CLINICAL DATA:  26 year old female with pneumothorax. Recent transesophageal echocardiogram. Subsequent encounter. EXAM: PORTABLE CHEST 1 VIEW COMPARISON:  11/20/2016 8:30 a.m. FINDINGS: Right-sided chest tube remains in place. No pneumothorax detected on this frontal projection. Cavitary lesions bilaterally. Left base consolidation. Atelectasis/pleural thickening right lung base. Cardiomegaly. IMPRESSION: No significant change since exam earlier today. Electronically Signed   By: Lacy Duverney M.D.   On: 11/20/2016 13:55   Dg Chest Port 1 View  Result Date: 11/20/2016 CLINICAL DATA:  Chest tube, shortness of Breath EXAM: PORTABLE CHEST 1 VIEW COMPARISON:  11/19/2016 FINDINGS: Right chest tube remains in place, unchanged. No pneumothorax. Patchy bilateral airspace opacities are again noted, some with cavitation. Small right pleural effusion. Heart is borderline in size. IMPRESSION: Right chest tube remains in place without pneumothorax. Stable patchy bilateral airspace disease, some of which is cavitary. Electronically Signed   By: Charlett Nose M.D.   On: 11/20/2016 08:42   Dg Chest Port 1 View  Result Date: 11/19/2016 CLINICAL DATA:  26 year old female with cavitary lung lesions possibly from septic endocarditis. Subsequent encounter. EXAM: PORTABLE CHEST 1 VIEW COMPARISON:   11/18/2016. FINDINGS: Right-sided chest tube remains in place. Tiny hydropneumothorax versus fluid within minor fissure noted. Multiple cavitary lesions. Right-sided pleural effusion. Basilar atelectasis versus infiltrate greater on the left. Cardiomegaly. Mild scoliosis. IMPRESSION: Right-sided chest tube remains in place. Tiny hydropneumothorax versus fluid within minor fissure. Multiple cavitary lesions. Right-sided pleural effusion. Basilar atelectasis versus infiltrate greater on the left. Electronically Signed   By: Lacy Duverney M.D.   On: 11/19/2016 07:16   Dg Chest Port 1 View  Result Date: 11/18/2016 CLINICAL DATA:  Followup left pneumothorax. Patient with bacterial endocarditis. EXAM: PORTABLE CHEST 1 VIEW COMPARISON:  11/17/2016 and prior studies FINDINGS: A moderate right hydropneumothorax is unchanged in size, but now with pleural fluid in its basilar portion. A right thoracostomy tube is unchanged. Scattered pulmonary opacities/ nodules and left lower lung consolidations/atelectasis again noted. There has been no other interval change. IMPRESSION: Moderate right hydropneumothorax, unchanged in size but now with pleural fluid in its basilar portion. Right thoracostomy tube remains unchanged. Electronically Signed   By: Harmon Pier M.D.   On: 11/18/2016 10:14   Dg Chest Port 1 View  Result Date: 11/17/2016 CLINICAL DATA:  Follow-up right-sided chest tube placement. Initial encounter. EXAM: PORTABLE CHEST 1 VIEW COMPARISON:  Chest radiograph performed 11/16/2016 FINDINGS: The patient's moderate right-sided pneumothorax has decreased in size. The right-sided chest tube is grossly unchanged in appearance. Vascular congestion is noted. Patchy left-sided airspace opacities raise concern for pneumonia. Right-sided airspace opacity may reflect atelectasis. A small left pleural effusion is noted. The cardiomediastinal silhouette is mildly enlarged. No acute osseous abnormalities  are identified.  IMPRESSION: 1. Moderate right-sided pneumothorax has decreased in size. Right-sided chest tube is grossly unchanged in appearance. 2. Vascular congestion and mild cardiomegaly. Patchy left-sided airspace opacities raise concern for pneumonia. Small left pleural effusion noted. 3. Right-sided airspace opacity may reflect atelectasis. Electronically Signed   By: Roanna Raider M.D.   On: 11/17/2016 00:33   Dg Chest Port 1 View  Result Date: 11/16/2016 CLINICAL DATA:  Chest tube placement for right pneumothorax. EXAM: PORTABLE CHEST 1 VIEW COMPARISON:  Single-view of the chest earlier today and 11/11/2016. FINDINGS: Pigtail catheter is now in place in the right chest. Large right pneumothorax is unchanged. Patchy airspace disease in cavitary lesions in the left chest persist. Heart size is normal. IMPRESSION: No change in large right pneumothorax after chest tube placement. No change in patchy airspace disease in cavitary lesions on the left. These results were called by telephone at the time of interpretation on 11/16/2016 at 8:10 pm to Estrella Deeds, RN, who verbally acknowledged these results. Electronically Signed   By: Drusilla Kanner M.D.   On: 11/16/2016 20:12   Dg Chest Port 1 View  Result Date: 11/16/2016 CLINICAL DATA:  Shortness of Breath EXAM: PORTABLE CHEST 1 VIEW COMPARISON:  11/11/2016 FINDINGS: Cardiac shadow is within normal limits. Cavitary nodular densities are noted throughout both lungs similar to that seen on previous CT examination. A new right-sided moderately large pneumothorax is noted with consolidation of the lower lobe on the right. No acute bony abnormality is noted. IMPRESSION: Moderately large right-sided pneumothorax new from the prior exam. Near complete collapse of the right lower lobe is noted. Stable cavitary lesions within both lungs. Critical Value/emergent results were called by telephone at the time of interpretation on 11/16/2016 at 6:24 pm to Dr Leitha Bleak, who verbally  acknowledged these results. Electronically Signed   By: Alcide Clever M.D.   On: 11/16/2016 18:27   Dg Chest Port 1 View  Result Date: 11/11/2016 CLINICAL DATA:  Check right IJ line placement.  Initial encounter. EXAM: PORTABLE CHEST 1 VIEW COMPARISON:  Chest radiograph performed 11/10/2016 FINDINGS: The right IJ line is noted ending at the right atrium. This could be retracted 2-3 cm. Mild vascular congestion is noted. Mild left-sided opacities may reflect atelectasis or mild pneumonia. No pleural effusion or pneumothorax is seen. The cardiomediastinal silhouette is borderline normal in size. No acute osseous abnormalities are identified. IMPRESSION: 1. Right IJ line noted ending at the right atrium. This could be retracted 2-3 cm, as deemed clinically appropriate. 2. Mild vascular congestion noted. 3. Mild left-sided airspace opacities may reflect atelectasis or mild pneumonia. Electronically Signed   By: Roanna Raider M.D.   On: 11/11/2016 00:37   Dg Chest Port 1 View  Result Date: 11/10/2016 CLINICAL DATA:  Retraction of the IJ line about 10 cm EXAM: PORTABLE CHEST 1 VIEW COMPARISON:  CT chest 11/10/2016.  Chest 11/10/2016. FINDINGS: The left central venous catheter tip localizes to the mid/distal right atrial region. If placement at or above the cavoatrial junction is desired, retraction of about 3-4 cm is recommended. No pneumothorax. Shallow inspiration with atelectasis in the lung bases. Borderline heart size. Patchy airspace infiltrates in both lungs could indicate edema, aspiration, or pneumonia. No blunting of costophrenic angles. IMPRESSION: Central venous catheter tip is in the mid/ low right atrial region. If placement at or above the cavoatrial junction is desired, retraction of about 3-4 cm is recommended. Electronically Signed   By: Burman Nieves M.D.   On:  11/10/2016 21:35   Dg Chest Portable 1 View  Result Date: 11/10/2016 CLINICAL DATA:  Status post central line placement. EXAM:  PORTABLE CHEST 1 VIEW COMPARISON:  Chest x-ray from earlier same day. FINDINGS: Interval placement of a right IJ central line with tip passing to the left of midline and likely in the right ventricle or main pulmonary artery. The bilateral small nodular airspace opacities throughout both lungs are unchanged in the short-term interval. Suspect small bilateral pleural effusions. No pneumothorax seen. IMPRESSION: 1. Right IJ central line placement with tip positioned to the left of midline either within the right ventricle or main pulmonary artery. Recommend retracting at least 10 cm. 2. Bilateral small nodular airspace opacities throughout both lungs are unchanged in the short-term interval. As recommended on earlier chest x-ray report, would consider chest CT for further characterization. 3. Suspect small bilateral pleural effusions. Electronically Signed   By: Bary Richard M.D.   On: 11/10/2016 18:37    Lab Data:  CBC:  Recent Labs Lab 11/26/16 0612 11/28/16 0405 11/29/16 0409 11/30/16 0440  WBC 5.5 6.8 6.6 7.6  HGB 9.5* 9.7* 9.4* 9.9*  HCT 29.2* 30.4* 29.4* 31.2*  MCV 87.7 86.6 87.5 87.6  PLT 326 329 317 327   Basic Metabolic Panel:  Recent Labs Lab 11/26/16 0612 11/27/16 0400 11/28/16 0405 11/29/16 0409 11/30/16 0440 12/01/16 0349 12/02/16 0432  NA 139 138 138  --   --  138  --   K 3.6 3.3* 3.5  --   --  3.4*  --   CL 107 104 105  --   --  104  --   CO2 24 27 26   --   --  28  --   GLUCOSE 86 94 98  --   --  95  --   BUN 10 6 8   --   --  10  --   CREATININE 0.92  0.95 0.79 0.78 0.71 0.78 0.68 0.63  CALCIUM 8.3* 8.4* 8.0*  --   --  8.5*  --    GFR: Estimated Creatinine Clearance: 88.2 mL/min (by C-G formula based on SCr of 0.63 mg/dL). Liver Function Tests: No results for input(s): AST, ALT, ALKPHOS, BILITOT, PROT, ALBUMIN in the last 168 hours. No results for input(s): LIPASE, AMYLASE in the last 168 hours. No results for input(s): AMMONIA in the last 168  hours. Coagulation Profile: No results for input(s): INR, PROTIME in the last 168 hours. Cardiac Enzymes: No results for input(s): CKTOTAL, CKMB, CKMBINDEX, TROPONINI in the last 168 hours. BNP (last 3 results) No results for input(s): PROBNP in the last 8760 hours. HbA1C: No results for input(s): HGBA1C in the last 72 hours. CBG: No results for input(s): GLUCAP in the last 168 hours. Lipid Profile: No results for input(s): CHOL, HDL, LDLCALC, TRIG, CHOLHDL, LDLDIRECT in the last 72 hours. Thyroid Function Tests: No results for input(s): TSH, T4TOTAL, FREET4, T3FREE, THYROIDAB in the last 72 hours. Anemia Panel: No results for input(s): VITAMINB12, FOLATE, FERRITIN, TIBC, IRON, RETICCTPCT in the last 72 hours. Urine analysis:    Component Value Date/Time   COLORURINE YELLOW 11/10/2016 2132   APPEARANCEUR CLEAR 11/10/2016 2132   LABSPEC 1.006 11/10/2016 2132   PHURINE 5.0 11/10/2016 2132   GLUCOSEU NEGATIVE 11/10/2016 2132   HGBUR MODERATE (A) 11/10/2016 2132   BILIRUBINUR NEGATIVE 11/10/2016 2132   KETONESUR NEGATIVE 11/10/2016 2132   PROTEINUR NEGATIVE 11/10/2016 2132   UROBILINOGEN 0.2 08/05/2010 1821   NITRITE NEGATIVE 11/10/2016 2132  LEUKOCYTESUR SMALL (A) 11/10/2016 2132     Timica Marcom M.D. [facility] 12/02/2016, 1:12 PM  Contact via [email or web address], password [email or web address] Call night coverage person covering after 7pm

## 2016-12-03 LAB — BASIC METABOLIC PANEL
ANION GAP: 7 (ref 5–15)
BUN: 9 mg/dL (ref 6–20)
CALCIUM: 8.9 mg/dL (ref 8.9–10.3)
CHLORIDE: 103 mmol/L (ref 101–111)
CO2: 28 mmol/L (ref 22–32)
CREATININE: 0.63 mg/dL (ref 0.44–1.00)
GFR calc Af Amer: >60 mL/min (ref >60)
GFR calc non Af Amer: >60 mL/min (ref >60)
GLUCOSE: 92 mg/dL (ref 65–99)
Potassium: 3.5 mmol/L (ref 3.5–5.1)
Sodium: 138 mmol/L (ref 135–145)

## 2016-12-03 MED ORDER — ACETAMINOPHEN 500 MG PO TABS
1000.0000 mg | ORAL_TABLET | Freq: Four times a day (QID) | ORAL | Status: DC | PRN
  Administered 2016-12-03 – 2016-12-23 (×6): 1000 mg via ORAL
  Filled 2016-12-03 (×6): qty 2

## 2016-12-03 NOTE — Progress Notes (Signed)
[facility]                                                                              Patient Demographics  Savannah Palmer, is a 26 y.o. female, DOB - 19-May-1991, YQM:[id number]  Admit date - 11/10/2016   Admitting Physician Coralyn HellingVineet Sood, MD  Outpatient Primary MD for the patient is Patient, No Pcp Per  Outpatient specialists:   LOS - 23  days   Medical records reviewed and are as summarized below:    No chief complaint on file.      Brief summary    26 year old Caucasian female with a past medical history of anxiety, depression, heroin abuse, presented with weakness, cough and chest pain. Initially was in septic shock and required pressors. Blood cultures grew MRSA. 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 She was placed on IV antibiotics. She also developed a spontaneous right-sided pneumothorax. Chest tube was placed by critical care medicine. [facility] from cardiothoracic surgery. Patient is not a candidate for surgery at this time.   Assessment & Plan    Principal Problem: MRSA bacteremia/Septic shock with right TV endocarditis and septic emboli to lungs/MRSA empyema - Patient presented with weakness, coughing, chest pain and septic shock requiring vasopressors. Blood cultures 5/27 grew out MRSA and was found to have septic emboli throughout her lungs.  -Repeat cultures were still positive, blood cultures repeat on 6/4 has been negative to date  - 2-D echo on 5/27 showed tricuspid vegetations with moderate TR. TEE on 6/1 showed large tricuspid vegetation, severe TR with dilated RV along with a loculated left effusion - HIV, Hep C negative. Due to family request, RPR was done and it was negative. - Due to persistent bacteremia patient was started on daptomycin 6/2 and Ceftaroline was continued to cover lung infection. Per ID  recommendations, Ceftaroline and daptomycin have been now discontinued. - cardiothoracic surgery was also consulted and patient was deemed not a candidate for surgery at this time.  - on vancomycin duration through July 15, will need BMP, CBC weekly, trough goal of 15-20.  - PICC line placed on 6/9, continue vancomycin, until July 15. Vanco trough in goal 19 -  Pending skilled nursing facility placement   Active problems: Right spontaneous pneumothorax- acute respiratory failure/MRSA Empyema -Right empyema with pneumothorax, positive for MRSA. Status post chest tube. - Chest tube removed on 6/7, chest x-ray clear   Acute kidney injury -Baseline creatinine 0.7, creatinine increased to 1.37, likely secondary to dehydration and NSAIDs. - patient received IV fluids, NSAIDs were discontinued, and creatinine function has normalized.   RUQ pain, likely musculoskeletal -CT abdomen and pelvis showed no liver or gallbladder abnormalities, possible musculoskeletal origin, currently stable.    Ascites, anasarca due to right sided hear failure - as a result of severe TR, net positive balance of 12L - O2 sats 96% on room air  Splenomegaly - etiology undetermined- CT on 5/30 revealed that the spleen is enlarged measuring 15.7 x 5.6 x 17 cm (volume= 780 cm^3) - liver  is unremarkable on CT  Normocytic Anemia - H&H currently stable, was transfused on 6/5, no overt bleeding noted - Iron was 12. Ferritin 213. B-12 1850. Folate 7.8. - Hemoglobin down to 7.1 on 6/10, patient was transfused 2 units packed RBC with 1 dose of IV Feraheme, currently stable  Hypokalemia/Hypophosphatemia/Hypomagnesemia - Potassium 3.3, replaced  Thrombocytopenia - Resolved  Heroin abuse - cont Suboxone, will need transition to a drug rehab program once stable.  Anxiety - Celexa discontinued per patient's request. Buspar restarted again today per patient's request.  - continue Neurontin and Xanax as needed at  this time.   Nicotine dependence -Continue  Nicoderm patch   Code Status: Full CODE STATUS  DVT Prophylaxis:  heparin  Family Communication: Discussed in detail with the patient, all imaging results, lab results explained to the patient and patient's mother at the bedside. Requested patient's mother not to video record any staff or providers.    Disposition Plan: Hopefully DC to skilled nursing facility when a bed is available. Disposition difficulty due to patient being on suboxone and history of IVDA   Time Spent in minutes  25 minutes  Procedures:  TEE  - Left ventricle: The cavity size was normal. Wall thickness was normal. Systolic function was normal. The estimated ejection fraction was in the range of 55% to 60%. Wall motion was normal; there were no regional wall motion abnormalities. - Left atrium: No evidence of thrombus in the atrial cavity or appendage. - Right ventricle: The cavity size was mildly dilated. Wall thickness was normal. - Right atrium: No evidence of thrombus in the atrial cavity or appendage. - Tricuspid valve: Flail motion of the posterior leaflet. There is a 8x5 mm vegetation attached to the posterior leaflet, but most of the &quot;mass&quot; described on transthoracic imaging appears to be the flail segment of the valve. There was severe regurgitation directed eccentrically and toward the free wall.  Right chest tube placement for pneumothorax 6/1  Placement of a larger bore chest tube in the right chest 6/3  PICC line placed on 6/9  Consultants:   Infectious disease Cardiothoracic surgery  Antimicrobials:   Daptomycin 6/2> 6/8  Teflaro 5/31> 6/8  Vancomycin 6/8>> will need till July 15   Medications  Scheduled Meds: . buprenorphine-naloxone  2 tablet Sublingual Daily  . busPIRone  10 mg Oral BID  . furosemide  20 mg Oral Daily  . gabapentin  300 mg Oral TID  . heparin  5,000 Units Subcutaneous Q8H  .  nicotine  14 mg Transdermal Daily  . potassium chloride  40 mEq Oral Daily  . saccharomyces boulardii  250 mg Oral BID   Continuous Infusions: . sodium chloride 250 mL (11/28/16 2032)  . vancomycin Stopped (12/03/16 1041)   PRN Meds:.sodium chloride, acetaminophen, albuterol, ALPRAZolam, docusate sodium, ibuprofen, nicotine polacrilex, ondansetron (ZOFRAN) IV, oxyCODONE, sodium chloride flush   Antibiotics   Anti-infectives    Start     Dose/Rate Route Frequency Ordered Stop   11/29/16 1000  vancomycin (VANCOCIN) IVPB 750 mg/150 ml premix     750 mg 150 mL/hr over 60 Minutes Intravenous Every 12 hours 11/29/16 0934     11/26/16 2000  vancomycin (VANCOCIN) IVPB 1000 mg/200 mL premix  Status:  Discontinued     1,000 mg 200 mL/hr over 60 Minutes Intravenous Every 12 hours 11/26/16 1912 11/29/16 0853   11/24/16 0000  vancomycin (VANCOCIN) IVPB 1000 mg/200 mL premix  Status:  Discontinued     1,000 mg 200 mL/hr  over 60 Minutes Intravenous Every 8 hours 11/23/16 1238 11/26/16 0721   11/23/16 1600  vancomycin (VANCOCIN) 1,500 mg in sodium chloride 0.9 % 500 mL IVPB     1,500 mg 250 mL/hr over 120 Minutes Intravenous  Once 11/23/16 1238 11/23/16 1733   11/17/16 1600  DAPTOmycin (CUBICIN) 500 mg in sodium chloride 0.9 % IVPB  Status:  Discontinued     500 mg 220 mL/hr over 30 Minutes Intravenous Every 24 hours 11/17/16 1432 11/23/16 1238   11/17/16 1500  ceftaroline (TEFLARO) 600 mg in sodium chloride 0.9 % 250 mL IVPB  Status:  Discontinued     600 mg 250 mL/hr over 60 Minutes Intravenous Every 12 hours 11/17/16 1435 11/23/16 1238   11/15/16 1800  ceftaroline (TEFLARO) 600 mg in sodium chloride 0.9 % 250 mL IVPB  Status:  Discontinued     600 mg 250 mL/hr over 60 Minutes Intravenous Every 12 hours 11/15/16 1635 11/17/16 1431   11/13/16 1215  vancomycin (VANCOCIN) IVPB 1000 mg/200 mL premix  Status:  Discontinued     1,000 mg 200 mL/hr over 60 Minutes Intravenous Every 8 hours 11/13/16  1203 11/16/16 0850   11/11/16 1100  vancomycin (VANCOCIN) IVPB 750 mg/150 ml premix  Status:  Discontinued     750 mg 150 mL/hr over 60 Minutes Intravenous Every 8 hours 11/11/16 0956 11/13/16 1203   11/11/16 0400  vancomycin (VANCOCIN) 500 mg in sodium chloride 0.9 % 100 mL IVPB  Status:  Discontinued     500 mg 100 mL/hr over 60 Minutes Intravenous Every 12 hours 11/10/16 1951 11/11/16 0956   11/10/16 2200  ceFEPIme (MAXIPIME) 1 g in dextrose 5 % 50 mL IVPB  Status:  Discontinued     1 g 100 mL/hr over 30 Minutes Intravenous Every 12 hours 11/10/16 1951 11/11/16 0926   11/10/16 2000  ceFEPIme (MAXIPIME) 2 g in dextrose 5 % 50 mL IVPB  Status:  Discontinued     2 g 100 mL/hr over 30 Minutes Intravenous  Once 11/10/16 1952 11/10/16 1954   11/10/16 2000  vancomycin (VANCOCIN) IVPB 1000 mg/200 mL premix  Status:  Discontinued     1,000 mg 200 mL/hr over 60 Minutes Intravenous  Once 11/10/16 1952 11/10/16 1954   11/10/16 1430  piperacillin-tazobactam (ZOSYN) IVPB 3.375 g     3.375 g 100 mL/hr over 30 Minutes Intravenous  Once 11/10/16 1426 11/10/16 1503   11/10/16 1430  vancomycin (VANCOCIN) IVPB 1000 mg/200 mL premix     1,000 mg 200 mL/hr over 60 Minutes Intravenous  Once 11/10/16 1426 11/10/16 1653        Subjective:   Savannah Palmer was seen and examined today. No complaints except feeling more anxious today. Wants to restart buspar back. No fevers or chills. Patient denies dizziness,  shortness of breath, abdominal pain, N/V/D/C, new weakness, numbess, tingling. No acute events overnight.    Objective:   Vitals:   12/02/16 1556 12/02/16 2220 12/03/16 0602 12/03/16 0703  BP: (!) 130/95 126/83 135/82   Pulse: 69 64 (!) 58   Resp: 18 18 18    Temp: 98.2 F (36.8 C) 98.7 F (37.1 C) 98.6 F (37 C)   TempSrc: Oral     SpO2: 97% 99% 96%   Weight:    62.1 kg (136 lb 12.8 oz)  Height:        Intake/Output Summary (Last 24 hours) at 12/03/16 1253 Last data filed at  12/02/16 1623  Gross per 24 hour  Intake              360 ml  Output                0 ml  Net              360 ml     Wt Readings from Last 3 Encounters:  12/03/16 62.1 kg (136 lb 12.8 oz)  03/14/16 58.5 kg (129 lb)  01/19/16 58.1 kg (128 lb)     Exam  General: Alert and oriented x 3, NAD  Eyes: EOMI PERRLA  HEENT: Normocephalic atraumatic  Cardiovascular: S1 and S2 clear, RRR systolic murmur in the tricuspid area   Respiratory: CTA B  Gastrointestinal: Soft, NT, ND, NBS  Ext: no pedal edema bilaterally  Neuro: no FND  Musculoskeletal: No cyanosis, clubbing  Skin: No rashes  Psych: slightly anxious, alert and oriented 3   Data Reviewed:  I have personally reviewed following labs and imaging studies  Micro Results No results found for this or any previous visit (from the past 240 hour(s)).  Radiology Reports Dg Chest 2 View  Result Date: 11/23/2016 CLINICAL DATA:  Chest tube removal EXAM: CHEST  2 VIEW COMPARISON:  11/21/2016 FINDINGS: Interval removal of right chest tube with residual small right pleural effusion versus pleural thickening. No pneumothorax. Patchy bilateral airspace opacities are again noted, unchanged. Heart is borderline in size. IMPRESSION: Interval removal of right chest tube without pneumothorax. Otherwise no change. Electronically Signed   By: Charlett Nose M.D.   On: 11/23/2016 08:44   Dg Chest 2 View  Result Date: 11/21/2016 CLINICAL DATA:  Chest soreness.  Chest tube . EXAM: CHEST  2 VIEW COMPARISON:  CT 11/20/2016.  Chest x-ray 11/20/2016 FINDINGS: Chest tube noted on the right. Tiny right anterior hydropneumothorax again noted. No interim change . Stable cardiomegaly. Persistent bilateral pulmonary nodules including cavitary nodules. Reference is made to prior CT report. Small right pleural effusion. No pneumothorax. IMPRESSION: 1. Right chest tube in stable position. Tiny right anterior hydropneumothorax again noted. No interim change. 2.  Multiple bilateral pulmonary nodules with cavitation. Reference is made to prior CT report of 11/20/2016 . Electronically Signed   By: Maisie Fus  Register   On: 11/21/2016 11:15   Dg Chest 2 View  Result Date: 11/19/2016 CLINICAL DATA:  Pneumothorax. EXAM: CHEST  2 VIEW COMPARISON:  11/19/2016. FINDINGS: Right chest tube in stable position. No significant pneumothorax noted on today's exam. Small right pleural effusion. Bilateral patchy pulmonary infiltrates with cavitation again noted. Small left pleural effusion noted. Heart size stable. No acute bony abnormality . IMPRESSION: 1. Right chest tube in stable position. No pneumothorax noted on today's exam. Small right pleural effusion. 2. Persistent bilateral cavitary pulmonary infiltrates. Small left pleural effusion. Electronically Signed   By: Maisie Fus  Register   On: 11/19/2016 16:53   Dg Chest 2 View  Result Date: 11/10/2016 CLINICAL DATA:  Chest pain, shortness of breath for 1 week EXAM: CHEST  2 VIEW COMPARISON:  CT chest 03/12/2016, chest x-ray 03/12/2016 FINDINGS: There are bilateral nodular airspace opacities throughout the upper and lower lobes bilaterally. There is no pleural effusion or pneumothorax. The heart and mediastinal contours are unremarkable. The osseous structures are unremarkable. IMPRESSION: New bilateral nodular airspace opacities throughout the upper and lower lobes bilaterally. Findings are concerning for multifocal infection such as septic emboli. Recommend further evaluation with a CT of the chest with intravenous contrast. Electronically Signed   By: Elige Ko  On: 11/10/2016 13:23   Ct Chest Wo Contrast  Result Date: 11/20/2016 CLINICAL DATA:  Follow-up right pleural effusion EXAM: CT CHEST WITHOUT CONTRAST TECHNIQUE: Multidetector CT imaging of the chest was performed following the standard protocol without IV contrast. COMPARISON:  Radiograph 11/20/2016, CT chest 11/10/2016, prior radiographs dating back to 11/10/2016  FINDINGS: Cardiovascular: Limited assessment without intravenous contrast. Small fluid in the superior pericardial recess. There is cardiomegaly. Mediastinum/Nodes: Difficult evaluation without contrast. Mediastinal adenopathy is present right paratracheal soft tissue fullness, possible adenopathy does not appear significantly changed. Midline trachea. Esophagus grossly unremarkable. Lungs/Pleura: Interim placement of a right lower lateral chest tube since the prior CT with the tip terminating along the right upper posterior pleural surface. Small bilateral pleural effusions right greater than left, slightly increased compared to the previous CT. Tiny right anterior hydropneumothorax. Multiple bilateral pulmonary nodules and cavitary lesions. Some of the cavitary lesions have decreased, however if there appears to be increased nodules/disease most notable in the left upper lobe and the right lower lobe. Decreased septal thickening compared to the previous exam. Upper Abdomen: Spleen appears enlarged. Partially visualized surgical clips in the gallbladder fossa. Musculoskeletal: No acute or suspicious bone lesion. IMPRESSION: 1. Interim placement of right-sided chest tube. Small residual pleural effusions right greater than left, mildly increased compared to the prior chest CT. Small right anterior hydropneumothorax. 2. Innumerable bilateral lung nodules and cavitary lesions, suspected to represent septic emboli. This appears increased in the left upper and right lower lobe since the prior CT. 3. Suspect mediastinal adenopathy although evaluation of the mediastinum is limited without contrast 4. Splenomegaly Electronically Signed   By: Jasmine Pang M.D.   On: 11/20/2016 21:25   Ct Chest Wo Contrast  Result Date: 11/10/2016 CLINICAL DATA:  She c/o some chest discomfrot and shortness of breath x 1 week. She states these are similar s/s she had a year ago, at which time she was dx with "pulmonary abscess". She is  in no distress. IV drug user EXAM: CT CHEST WITHOUT CONTRAST TECHNIQUE: Multidetector CT imaging of the chest was performed following the standard protocol without IV contrast. COMPARISON:  Chest CT angiogram dated 03/12/2016. FINDINGS: Cardiovascular: Probable small pericardial effusion and/or pericardial thickening, difficult to characterize without IV contrast. Heart size is within normal limits. Thoracic aorta appears to be normal in caliber. Mediastinum/Nodes: Soft tissue density material within the right upper peritracheal space, also difficult to characterize without IV contrast, presumed lymphadenopathy. Additional mild lymphadenopathy within the anterior mediastinum and aortopulmonary window region. Lungs/Pleura: Numerous cavitary consolidations throughout both lungs, peripherally distributed suggesting septic emboli. More confluent consolidations at each lung base, more likely atelectasis than pneumonia. Small right pleural effusion. Upper Abdomen: Suspected splenomegaly, incompletely imaged. Musculoskeletal: No acute or suspicious osseous finding. IMPRESSION: 1. Numerous cavitary consolidations throughout both lungs, with a peripheral distribution suggesting septic emboli. Largest cavitary consolidation is within the left lower lobe measuring approximately 4 cm greatest dimension. 2. Probable small pericardial effusion and/or pericardial wall thickening, difficult to delineate without IV contrast. 3. Probable mediastinal lymphadenopathy, again difficult to definitively characterize without IV contrast, alternatively confluent fluid or edema within the mediastinum. 4. Small right pleural effusion. 5. Probable splenomegaly, incompletely imaged at the lower aspects of this chest CT. 6. Right IJ line in place with tip passing through the right atrium and into the right ventricle. Recommend retracting to the cavoatrial junction for optimal radiographic positioning. These results and recommendations were called  by telephone at the time of interpretation on 11/10/2016 at  7:35 pm to Dr. Katrinka Blazing , who verbally acknowledged these results. Electronically Signed   By: Bary Richard M.D.   On: 11/10/2016 19:36   Ct Abdomen Pelvis W Contrast  Result Date: 11/14/2016 CLINICAL DATA:  Right upper quadrant pain times 2-3 days. Polysubstance abuse. Cholecystectomy. History of endocarditis and septic emboli. EXAM: CT ABDOMEN AND PELVIS WITH CONTRAST TECHNIQUE: Multidetector CT imaging of the abdomen and pelvis was performed using the standard protocol following bolus administration of intravenous contrast. CONTRAST:  80mL ISOVUE-300 IOPAMIDOL (ISOVUE-300) INJECTION 61% COMPARISON:  03/12/2016 chest CT FINDINGS: Lower chest: Small right effusion with several bilateral cavitary opacities in both lower lobes concerning for septic emboli or cavitary pneumonia. More confluent airspace opacity in the left lower lobe is also noted. The visualized heart is normal in size. There is no pericardial effusion. No large central pulmonary embolus. Hepatobiliary: Normal appearance of the liver without space-occupying mass or biliary dilatation. Cholecystectomy. Pancreas: No pancreatic mass or ductal dilatation. Mild enlargement of the pancreatic head with surrounding peripancreatic fluid is noted. Correlate to exclude a pancreatitis. Spleen: The spleen is enlarged measuring 15.7 x 5.6 x 17 cm (volume = 780 cm^3) and demonstrates a developmental cleft, partially visualized on prior chest CT and therefore believed less likely to represent a laceration. Adrenals/Urinary Tract: Adrenal glands are unremarkable. Kidneys are normal, without renal calculi, solid appearing lesions, or hydronephrosis. There appear to be pair of water attenuating cysts in the lower pole the right kidney measuring up to 2 x 1.3 cm in aggregate. Bladder is unremarkable. Stomach/Bowel: Stomach is within normal limits. Appendix appears normal. No evidence of bowel wall thickening,  distention, or inflammatory changes. Vascular/Lymphatic: No significant vascular findings are present. No enlarged abdominal or pelvic lymph nodes. Reproductive: Uterus and bilateral adnexa are unremarkable. Other: Moderate volume of ascites.  Anasarca. Musculoskeletal: No acute or significant osseous findings. IMPRESSION: 1. Bibasilar, multifocal cavitary lesions noted within the visualized lower lobes consistent with septic emboli or possibly cavitary pneumonia. More confluent airspace disease in the left lower lobe without cavitation is also present. There is a small right pleural effusion. 2. Anasarca with moderate volume of ascites noted within the abdomen and pelvis. Question right heart dysfunction/failure. 3. Splenomegaly. 4. Water attenuating cysts in the lower pole the right kidney in aggregate measuring 2 x 1.3 cm. 5. Status post cholecystectomy. Electronically Signed   By: Tollie Eth M.D.   On: 11/14/2016 16:26   Dg Chest 1v Repeat Same Day  Result Date: 11/18/2016 CLINICAL DATA:  Chest tube placement EXAM: CHEST - 1 VIEW SAME DAY COMPARISON:  Earlier same day FINDINGS: Large bore chest tube placed on the right along the lateral margin. Marked reduction an amount of right pleural air, nearly completely evacuated. Patchy infiltrates persist in both lower lobes. IMPRESSION: Large bore chest tube placed. Near complete resolution of right pneumothorax. Electronically Signed   By: Paulina Fusi M.D.   On: 11/18/2016 11:40   Dg Chest Port 1 View  Result Date: 11/20/2016 CLINICAL DATA:  26 year old female with pneumothorax. Recent transesophageal echocardiogram. Subsequent encounter. EXAM: PORTABLE CHEST 1 VIEW COMPARISON:  11/20/2016 8:30 a.m. FINDINGS: Right-sided chest tube remains in place. No pneumothorax detected on this frontal projection. Cavitary lesions bilaterally. Left base consolidation. Atelectasis/pleural thickening right lung base. Cardiomegaly. IMPRESSION: No significant change since exam  earlier today. Electronically Signed   By: Lacy Duverney M.D.   On: 11/20/2016 13:55   Dg Chest Port 1 View  Result Date: 11/20/2016 CLINICAL DATA:  Chest  tube, shortness of Breath EXAM: PORTABLE CHEST 1 VIEW COMPARISON:  11/19/2016 FINDINGS: Right chest tube remains in place, unchanged. No pneumothorax. Patchy bilateral airspace opacities are again noted, some with cavitation. Small right pleural effusion. Heart is borderline in size. IMPRESSION: Right chest tube remains in place without pneumothorax. Stable patchy bilateral airspace disease, some of which is cavitary. Electronically Signed   By: Charlett Nose M.D.   On: 11/20/2016 08:42   Dg Chest Port 1 View  Result Date: 11/19/2016 CLINICAL DATA:  26 year old female with cavitary lung lesions possibly from septic endocarditis. Subsequent encounter. EXAM: PORTABLE CHEST 1 VIEW COMPARISON:  11/18/2016. FINDINGS: Right-sided chest tube remains in place. Tiny hydropneumothorax versus fluid within minor fissure noted. Multiple cavitary lesions. Right-sided pleural effusion. Basilar atelectasis versus infiltrate greater on the left. Cardiomegaly. Mild scoliosis. IMPRESSION: Right-sided chest tube remains in place. Tiny hydropneumothorax versus fluid within minor fissure. Multiple cavitary lesions. Right-sided pleural effusion. Basilar atelectasis versus infiltrate greater on the left. Electronically Signed   By: Lacy Duverney M.D.   On: 11/19/2016 07:16   Dg Chest Port 1 View  Result Date: 11/18/2016 CLINICAL DATA:  Followup left pneumothorax. Patient with bacterial endocarditis. EXAM: PORTABLE CHEST 1 VIEW COMPARISON:  11/17/2016 and prior studies FINDINGS: A moderate right hydropneumothorax is unchanged in size, but now with pleural fluid in its basilar portion. A right thoracostomy tube is unchanged. Scattered pulmonary opacities/ nodules and left lower lung consolidations/atelectasis again noted. There has been no other interval change. IMPRESSION:  Moderate right hydropneumothorax, unchanged in size but now with pleural fluid in its basilar portion. Right thoracostomy tube remains unchanged. Electronically Signed   By: Harmon Pier M.D.   On: 11/18/2016 10:14   Dg Chest Port 1 View  Result Date: 11/17/2016 CLINICAL DATA:  Follow-up right-sided chest tube placement. Initial encounter. EXAM: PORTABLE CHEST 1 VIEW COMPARISON:  Chest radiograph performed 11/16/2016 FINDINGS: The patient's moderate right-sided pneumothorax has decreased in size. The right-sided chest tube is grossly unchanged in appearance. Vascular congestion is noted. Patchy left-sided airspace opacities raise concern for pneumonia. Right-sided airspace opacity may reflect atelectasis. A small left pleural effusion is noted. The cardiomediastinal silhouette is mildly enlarged. No acute osseous abnormalities are identified. IMPRESSION: 1. Moderate right-sided pneumothorax has decreased in size. Right-sided chest tube is grossly unchanged in appearance. 2. Vascular congestion and mild cardiomegaly. Patchy left-sided airspace opacities raise concern for pneumonia. Small left pleural effusion noted. 3. Right-sided airspace opacity may reflect atelectasis. Electronically Signed   By: Roanna Raider M.D.   On: 11/17/2016 00:33   Dg Chest Port 1 View  Result Date: 11/16/2016 CLINICAL DATA:  Chest tube placement for right pneumothorax. EXAM: PORTABLE CHEST 1 VIEW COMPARISON:  Single-view of the chest earlier today and 11/11/2016. FINDINGS: Pigtail catheter is now in place in the right chest. Large right pneumothorax is unchanged. Patchy airspace disease in cavitary lesions in the left chest persist. Heart size is normal. IMPRESSION: No change in large right pneumothorax after chest tube placement. No change in patchy airspace disease in cavitary lesions on the left. These results were called by telephone at the time of interpretation on 11/16/2016 at 8:10 pm to Estrella Deeds, RN, who verbally  acknowledged these results. Electronically Signed   By: Drusilla Kanner M.D.   On: 11/16/2016 20:12   Dg Chest Port 1 View  Result Date: 11/16/2016 CLINICAL DATA:  Shortness of Breath EXAM: PORTABLE CHEST 1 VIEW COMPARISON:  11/11/2016 FINDINGS: Cardiac shadow is within normal limits. Cavitary nodular  densities are noted throughout both lungs similar to that seen on previous CT examination. A new right-sided moderately large pneumothorax is noted with consolidation of the lower lobe on the right. No acute bony abnormality is noted. IMPRESSION: Moderately large right-sided pneumothorax new from the prior exam. Near complete collapse of the right lower lobe is noted. Stable cavitary lesions within both lungs. Critical Value/emergent results were called by telephone at the time of interpretation on 11/16/2016 at 6:24 pm to Dr Leitha Bleak, who verbally acknowledged these results. Electronically Signed   By: Alcide Clever M.D.   On: 11/16/2016 18:27   Dg Chest Port 1 View  Result Date: 11/11/2016 CLINICAL DATA:  Check right IJ line placement.  Initial encounter. EXAM: PORTABLE CHEST 1 VIEW COMPARISON:  Chest radiograph performed 11/10/2016 FINDINGS: The right IJ line is noted ending at the right atrium. This could be retracted 2-3 cm. Mild vascular congestion is noted. Mild left-sided opacities may reflect atelectasis or mild pneumonia. No pleural effusion or pneumothorax is seen. The cardiomediastinal silhouette is borderline normal in size. No acute osseous abnormalities are identified. IMPRESSION: 1. Right IJ line noted ending at the right atrium. This could be retracted 2-3 cm, as deemed clinically appropriate. 2. Mild vascular congestion noted. 3. Mild left-sided airspace opacities may reflect atelectasis or mild pneumonia. Electronically Signed   By: Roanna Raider M.D.   On: 11/11/2016 00:37   Dg Chest Port 1 View  Result Date: 11/10/2016 CLINICAL DATA:  Retraction of the IJ line about 10 cm EXAM: PORTABLE  CHEST 1 VIEW COMPARISON:  CT chest 11/10/2016.  Chest 11/10/2016. FINDINGS: The left central venous catheter tip localizes to the mid/distal right atrial region. If placement at or above the cavoatrial junction is desired, retraction of about 3-4 cm is recommended. No pneumothorax. Shallow inspiration with atelectasis in the lung bases. Borderline heart size. Patchy airspace infiltrates in both lungs could indicate edema, aspiration, or pneumonia. No blunting of costophrenic angles. IMPRESSION: Central venous catheter tip is in the mid/ low right atrial region. If placement at or above the cavoatrial junction is desired, retraction of about 3-4 cm is recommended. Electronically Signed   By: Burman Nieves M.D.   On: 11/10/2016 21:35   Dg Chest Portable 1 View  Result Date: 11/10/2016 CLINICAL DATA:  Status post central line placement. EXAM: PORTABLE CHEST 1 VIEW COMPARISON:  Chest x-ray from earlier same day. FINDINGS: Interval placement of a right IJ central line with tip passing to the left of midline and likely in the right ventricle or main pulmonary artery. The bilateral small nodular airspace opacities throughout both lungs are unchanged in the short-term interval. Suspect small bilateral pleural effusions. No pneumothorax seen. IMPRESSION: 1. Right IJ central line placement with tip positioned to the left of midline either within the right ventricle or main pulmonary artery. Recommend retracting at least 10 cm. 2. Bilateral small nodular airspace opacities throughout both lungs are unchanged in the short-term interval. As recommended on earlier chest x-ray report, would consider chest CT for further characterization. 3. Suspect small bilateral pleural effusions. Electronically Signed   By: Bary Richard M.D.   On: 11/10/2016 18:37    Lab Data:  CBC:  Recent Labs Lab 11/28/16 0405 11/29/16 0409 11/30/16 0440  WBC 6.8 6.6 7.6  HGB 9.7* 9.4* 9.9*  HCT 30.4* 29.4* 31.2*  MCV 86.6 87.5 87.6    PLT 329 317 327   Basic Metabolic Panel:  Recent Labs Lab 11/27/16 0400 11/28/16 0405 11/29/16 0409  11/30/16 0440 12/01/16 0349 12/02/16 0432 12/03/16 0428  NA 138 138  --   --  138  --  138  K 3.3* 3.5  --   --  3.4*  --  3.5  CL 104 105  --   --  104  --  103  CO2 27 26  --   --  28  --  28  GLUCOSE 94 98  --   --  95  --  92  BUN 6 8  --   --  10  --  9  CREATININE 0.79 0.78 0.71 0.78 0.68 0.63 0.63  CALCIUM 8.4* 8.0*  --   --  8.5*  --  8.9   GFR: Estimated Creatinine Clearance: 88.2 mL/min (by C-G formula based on SCr of 0.63 mg/dL). Liver Function Tests: No results for input(s): AST, ALT, ALKPHOS, BILITOT, PROT, ALBUMIN in the last 168 hours. No results for input(s): LIPASE, AMYLASE in the last 168 hours. No results for input(s): AMMONIA in the last 168 hours. Coagulation Profile: No results for input(s): INR, PROTIME in the last 168 hours. Cardiac Enzymes: No results for input(s): CKTOTAL, CKMB, CKMBINDEX, TROPONINI in the last 168 hours. BNP (last 3 results) No results for input(s): PROBNP in the last 8760 hours. HbA1C: No results for input(s): HGBA1C in the last 72 hours. CBG: No results for input(s): GLUCAP in the last 168 hours. Lipid Profile: No results for input(s): CHOL, HDL, LDLCALC, TRIG, CHOLHDL, LDLDIRECT in the last 72 hours. Thyroid Function Tests: No results for input(s): TSH, T4TOTAL, FREET4, T3FREE, THYROIDAB in the last 72 hours. Anemia Panel: No results for input(s): VITAMINB12, FOLATE, FERRITIN, TIBC, IRON, RETICCTPCT in the last 72 hours. Urine analysis:    Component Value Date/Time   COLORURINE YELLOW 11/10/2016 2132   APPEARANCEUR CLEAR 11/10/2016 2132   LABSPEC 1.006 11/10/2016 2132   PHURINE 5.0 11/10/2016 2132   GLUCOSEU NEGATIVE 11/10/2016 2132   HGBUR MODERATE (A) 11/10/2016 2132   BILIRUBINUR NEGATIVE 11/10/2016 2132   KETONESUR NEGATIVE 11/10/2016 2132   PROTEINUR NEGATIVE 11/10/2016 2132   UROBILINOGEN 0.2 08/05/2010 1821    NITRITE NEGATIVE 11/10/2016 2132   LEUKOCYTESUR SMALL (A) 11/10/2016 2132     Annalyce Lanpher M.D. [facility] 12/03/2016, 12:53 PM  Pager: [phone] Between 7am to 7pm - call Pager - [phone]  After 7pm go to [email or web address] - password [email or web address]  Call night coverage person covering after 7pm            [facility]                                                                              Patient Demographics  Savannah Palmer, is a 26 y.o. female, DOB - 1990/09/09, HQI:[id number]  Admit date - 11/10/2016   Admitting Physician Coralyn Helling, MD  Outpatient Primary MD for the patient is Patient, No Pcp Per  Outpatient specialists:   LOS - 23  days   Medical records reviewed and are as summarized below:    No chief complaint on file.      Brief summary    26 year old Caucasian female with a past medical history of anxiety, depression, heroin abuse, presented with weakness, cough and  chest pain. Initially was in septic shock and required pressors. Blood cultures grew MRSA. 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 She was placed on IV antibiotics. She also developed a spontaneous right-sided pneumothorax. Chest tube was placed by critical care medicine. She was transferred to [facility] for opinion from cardiothoracic surgery. Patient is not a candidate for surgery at this time. Remains stable on IV Vancomycin  Assessment & Plan    MRSA bacteremia/Septic shock with right TV endocarditis and septic emboli to lungs/MRSA empyema -Admitted with septic shock to ICU requiring vasopressors -Sepsis resolved -Blood cultures 5/27 grew MRSA and found to have septic pulmonary emboli -Repeat cultures subsequently positive but have been negative since 6/4  -2-D echo on 5/27 with tricuspid valve vegetations, moderate TR, TEE on 6/1 showed large tricuspid valve vegetation with severe TR and  dilated RV  - Due to persistent bacteremia patient was started on daptomycin 6/2 and Ceftaroline continued to cover lung infection. - cardiothoracic surgery was also consulted, not a candidate for surgery at this time.  - Per ID recommendations, stopped Ceftaroline and daptomycin, use vancomycin alone, duration through July 15, will need BMP, CBC weekly, trough goal of 15-20.  -ID follow-up to be arranged -she has been on suboxone intermittently for 2years, very reluctant to come off this now since it helps her significantly with cravings/risk of heroin relapse if she stops this  Acute respiratory failure/spontaneous right pneumothorax/MRSA empyema -Followed by CVTS, status post chest tube drainage, improved -Chest tube removed 6/7 -Stable now and repeat chest x-rays clear  Acute kidney injury -Secondary to NSAIDs and dehydration -Resolved  Ascites, anasarca due to right sided hear failure - as a result of severe TR, net positive balance of 13.7 L - limit IVF, hold off on administering diuretics in setting of sepsis and borderline BPs, O2 sats 96% on room air - resume Po Lasix 20 mg daily  Splenomegaly - etiology undetermined- CT on 5/30 revealed that the spleen is enlarged measuring 15.7 x 5.6 x 17 cm (volume= 780 cm^3) - liver is unremarkable on CT - Needs follow-up   Normocytic Anemia - H&H currently stable, was transfused on 6/5, no overt bleeding noted - Iron was 12. Ferritin 213. B-12 1850. Folate 7.8.  Hypokalemia/Hypophosphatemia/Hypomagnesemia - Resolved  Thrombocytopenia - Resolved  Heroin abuse - cont Suboxone, reluctant to come off this and risk of heroin relapse  Anxiety - cont Buspar, Neurontin, Xanax  Nicotine dependence -Continue  Nicoderm patch  Code Status: Full CODE STATUS  DVT Prophylaxis:  heparin  Family Communication: Mother at bedside Disposition Plan: to be determined  Time Spent in minutes  25 minutes  Procedures:  TEE  - Left  ventricle: The cavity size was normal. Wall thickness was normal. Systolic function was normal. The estimated ejection fraction was in the range of 55% to 60%. Wall motion was normal; there were no regional wall motion abnormalities. - Left atrium: No evidence of thrombus in the atrial cavity or appendage. - Right ventricle: The cavity size was mildly dilated. Wall thickness was normal. - Right atrium: No evidence of thrombus in the atrial cavity or appendage. - Tricuspid valve: Flail motion of the posterior leaflet. There is a 8x5 mm vegetation attached to the posterior leaflet, but most of the &quot;mass&quot; described on transthoracic imaging appears to be the flail segment of the valve. There was severe regurgitation directed eccentrically and toward the free wall.  Right chest tube placement for pneumothorax 6/1  Placement of a larger bore chest tube in the right chest 6/3  Consultants:   Infectious disease Cardiothoracic surgery  Antimicrobials:   Daptomycin 6/2> 6/8  Teflaro 5/31> 6/8  Vancomycin 6/8>> will need to July 15   Medications  Scheduled Meds: . buprenorphine-naloxone  2 tablet Sublingual Daily  . busPIRone  10 mg Oral BID  . furosemide  20 mg Oral Daily  . gabapentin  300 mg Oral TID  . heparin  5,000 Units Subcutaneous Q8H  . nicotine  14 mg Transdermal Daily  . potassium chloride  40 mEq Oral Daily  . saccharomyces boulardii  250 mg Oral BID   Continuous Infusions: . sodium chloride 250 mL (11/28/16 2032)  . vancomycin Stopped (12/03/16 1041)   PRN Meds:.sodium chloride, acetaminophen, albuterol, ALPRAZolam, docusate sodium, ibuprofen, nicotine polacrilex, ondansetron (ZOFRAN) IV, oxyCODONE, sodium chloride flush   Antibiotics   Anti-infectives    Start     Dose/Rate Route Frequency Ordered Stop   11/29/16 1000  vancomycin (VANCOCIN) IVPB 750 mg/150 ml premix     750 mg 150 mL/hr over 60 Minutes Intravenous Every 12  hours 11/29/16 0934     11/26/16 2000  vancomycin (VANCOCIN) IVPB 1000 mg/200 mL premix  Status:  Discontinued     1,000 mg 200 mL/hr over 60 Minutes Intravenous Every 12 hours 11/26/16 1912 11/29/16 0853   11/24/16 0000  vancomycin (VANCOCIN) IVPB 1000 mg/200 mL premix  Status:  Discontinued     1,000 mg 200 mL/hr over 60 Minutes Intravenous Every 8 hours 11/23/16 1238 11/26/16 0721   11/23/16 1600  vancomycin (VANCOCIN) 1,500 mg in sodium chloride 0.9 % 500 mL IVPB     1,500 mg 250 mL/hr over 120 Minutes Intravenous  Once 11/23/16 1238 11/23/16 1733   11/17/16 1600  DAPTOmycin (CUBICIN) 500 mg in sodium chloride 0.9 % IVPB  Status:  Discontinued     500 mg 220 mL/hr over 30 Minutes Intravenous Every 24 hours 11/17/16 1432 11/23/16 1238   11/17/16 1500  ceftaroline (TEFLARO) 600 mg in sodium chloride 0.9 % 250 mL IVPB  Status:  Discontinued     600 mg 250 mL/hr over 60 Minutes Intravenous Every 12 hours 11/17/16 1435 11/23/16 1238   11/15/16 1800  ceftaroline (TEFLARO) 600 mg in sodium chloride 0.9 % 250 mL IVPB  Status:  Discontinued     600 mg 250 mL/hr over 60 Minutes Intravenous Every 12 hours 11/15/16 1635 11/17/16 1431   11/13/16 1215  vancomycin (VANCOCIN) IVPB 1000 mg/200 mL premix  Status:  Discontinued     1,000 mg 200 mL/hr over 60 Minutes Intravenous Every 8 hours 11/13/16 1203 11/16/16 0850   11/11/16 1100  vancomycin (VANCOCIN) IVPB 750 mg/150 ml premix  Status:  Discontinued     750 mg 150 mL/hr over 60 Minutes Intravenous Every 8 hours 11/11/16 0956 11/13/16 1203   11/11/16 0400  vancomycin (VANCOCIN) 500 mg in sodium chloride 0.9 % 100 mL IVPB  Status:  Discontinued     500 mg 100 mL/hr over 60 Minutes Intravenous Every 12 hours 11/10/16 1951 11/11/16 0956   11/10/16 2200  ceFEPIme (MAXIPIME) 1 g in dextrose 5 % 50 mL IVPB  Status:  Discontinued     1 g 100 mL/hr over 30 Minutes Intravenous Every 12 hours 11/10/16 1951 11/11/16 0926   11/10/16 2000  ceFEPIme  (MAXIPIME) 2 g in dextrose 5 % 50 mL IVPB  Status:  Discontinued  2 g 100 mL/hr over 30 Minutes Intravenous  Once 11/10/16 1952 11/10/16 1954   11/10/16 2000  vancomycin (VANCOCIN) IVPB 1000 mg/200 mL premix  Status:  Discontinued     1,000 mg 200 mL/hr over 60 Minutes Intravenous  Once 11/10/16 1952 11/10/16 1954   11/10/16 1430  piperacillin-tazobactam (ZOSYN) IVPB 3.375 g     3.375 g 100 mL/hr over 30 Minutes Intravenous  Once 11/10/16 1426 11/10/16 1503   11/10/16 1430  vancomycin (VANCOCIN) IVPB 1000 mg/200 mL premix     1,000 mg 200 mL/hr over 60 Minutes Intravenous  Once 11/10/16 1426 11/10/16 1653        Subjective:  -feels ok, no complaints, swelling improving  Objective:   Vitals:   12/02/16 1556 12/02/16 2220 12/03/16 0602 12/03/16 0703  BP: (!) 130/95 126/83 135/82   Pulse: 69 64 (!) 58   Resp: 18 18 18    Temp: 98.2 F (36.8 C) 98.7 F (37.1 C) 98.6 F (37 C)   TempSrc: Oral     SpO2: 97% 99% 96%   Weight:    62.1 kg (136 lb 12.8 oz)  Height:        Intake/Output Summary (Last 24 hours) at 12/03/16 1253 Last data filed at 12/02/16 1623  Gross per 24 hour  Intake              360 ml  Output                0 ml  Net              360 ml     Wt Readings from Last 3 Encounters:  12/03/16 62.1 kg (136 lb 12.8 oz)  03/14/16 58.5 kg (129 lb)  01/19/16 58.1 kg (128 lb)     Exam  Gen: Awake, Alert, Oriented X 3,  HEENT: PERRLA, Neck supple, no JVD Lungs: Good air movement bilaterally, CTAB CVS: RRR,grade 2 systolic murmur Abd: soft, Non tender, non distended, BS present Extremities: No Cyanosis, Clubbing or edema Skin: no new rashes  Data Reviewed:  I have personally reviewed following labs and imaging studies  Micro Results No results found for this or any previous visit (from the past 240 hour(s)).  Radiology Reports Dg Chest 2 View  Result Date: 11/23/2016 CLINICAL DATA:  Chest tube removal EXAM: CHEST  2 VIEW COMPARISON:  11/21/2016  FINDINGS: Interval removal of right chest tube with residual small right pleural effusion versus pleural thickening. No pneumothorax. Patchy bilateral airspace opacities are again noted, unchanged. Heart is borderline in size. IMPRESSION: Interval removal of right chest tube without pneumothorax. Otherwise no change. Electronically Signed   By: Charlett Nose M.D.   On: 11/23/2016 08:44   Dg Chest 2 View  Result Date: 11/21/2016 CLINICAL DATA:  Chest soreness.  Chest tube . EXAM: CHEST  2 VIEW COMPARISON:  CT 11/20/2016.  Chest x-ray 11/20/2016 FINDINGS: Chest tube noted on the right. Tiny right anterior hydropneumothorax again noted. No interim change . Stable cardiomegaly. Persistent bilateral pulmonary nodules including cavitary nodules. Reference is made to prior CT report. Small right pleural effusion. No pneumothorax. IMPRESSION: 1. Right chest tube in stable position. Tiny right anterior hydropneumothorax again noted. No interim change. 2. Multiple bilateral pulmonary nodules with cavitation. Reference is made to prior CT report of 11/20/2016 . Electronically Signed   By: Maisie Fus  Register   On: 11/21/2016 11:15   Dg Chest 2 View  Result Date: 11/19/2016 CLINICAL DATA:  Pneumothorax. EXAM: CHEST  2 VIEW COMPARISON:  11/19/2016. FINDINGS: Right chest tube in stable position. No significant pneumothorax noted on today's exam. Small right pleural effusion. Bilateral patchy pulmonary infiltrates with cavitation again noted. Small left pleural effusion noted. Heart size stable. No acute bony abnormality . IMPRESSION: 1. Right chest tube in stable position. No pneumothorax noted on today's exam. Small right pleural effusion. 2. Persistent bilateral cavitary pulmonary infiltrates. Small left pleural effusion. Electronically Signed   By: Maisie Fus  Register   On: 11/19/2016 16:53   Dg Chest 2 View  Result Date: 11/10/2016 CLINICAL DATA:  Chest pain, shortness of breath for 1 week EXAM: CHEST  2 VIEW COMPARISON:   CT chest 03/12/2016, chest x-ray 03/12/2016 FINDINGS: There are bilateral nodular airspace opacities throughout the upper and lower lobes bilaterally. There is no pleural effusion or pneumothorax. The heart and mediastinal contours are unremarkable. The osseous structures are unremarkable. IMPRESSION: New bilateral nodular airspace opacities throughout the upper and lower lobes bilaterally. Findings are concerning for multifocal infection such as septic emboli. Recommend further evaluation with a CT of the chest with intravenous contrast. Electronically Signed   By: Elige Ko   On: 11/10/2016 13:23   Ct Chest Wo Contrast  Result Date: 11/20/2016 CLINICAL DATA:  Follow-up right pleural effusion EXAM: CT CHEST WITHOUT CONTRAST TECHNIQUE: Multidetector CT imaging of the chest was performed following the standard protocol without IV contrast. COMPARISON:  Radiograph 11/20/2016, CT chest 11/10/2016, prior radiographs dating back to 11/10/2016 FINDINGS: Cardiovascular: Limited assessment without intravenous contrast. Small fluid in the superior pericardial recess. There is cardiomegaly. Mediastinum/Nodes: Difficult evaluation without contrast. Mediastinal adenopathy is present right paratracheal soft tissue fullness, possible adenopathy does not appear significantly changed. Midline trachea. Esophagus grossly unremarkable. Lungs/Pleura: Interim placement of a right lower lateral chest tube since the prior CT with the tip terminating along the right upper posterior pleural surface. Small bilateral pleural effusions right greater than left, slightly increased compared to the previous CT. Tiny right anterior hydropneumothorax. Multiple bilateral pulmonary nodules and cavitary lesions. Some of the cavitary lesions have decreased, however if there appears to be increased nodules/disease most notable in the left upper lobe and the right lower lobe. Decreased septal thickening compared to the previous exam. Upper Abdomen:  Spleen appears enlarged. Partially visualized surgical clips in the gallbladder fossa. Musculoskeletal: No acute or suspicious bone lesion. IMPRESSION: 1. Interim placement of right-sided chest tube. Small residual pleural effusions right greater than left, mildly increased compared to the prior chest CT. Small right anterior hydropneumothorax. 2. Innumerable bilateral lung nodules and cavitary lesions, suspected to represent septic emboli. This appears increased in the left upper and right lower lobe since the prior CT. 3. Suspect mediastinal adenopathy although evaluation of the mediastinum is limited without contrast 4. Splenomegaly Electronically Signed   By: Jasmine Pang M.D.   On: 11/20/2016 21:25   Ct Chest Wo Contrast  Result Date: 11/10/2016 CLINICAL DATA:  She c/o some chest discomfrot and shortness of breath x 1 week. She states these are similar s/s she had a year ago, at which time she was dx with "pulmonary abscess". She is in no distress. IV drug user EXAM: CT CHEST WITHOUT CONTRAST TECHNIQUE: Multidetector CT imaging of the chest was performed following the standard protocol without IV contrast. COMPARISON:  Chest CT angiogram dated 03/12/2016. FINDINGS: Cardiovascular: Probable small pericardial effusion and/or pericardial thickening, difficult to characterize without IV contrast. Heart size is within normal limits. Thoracic aorta appears to be normal in caliber. Mediastinum/Nodes: Soft tissue  density material within the right upper peritracheal space, also difficult to characterize without IV contrast, presumed lymphadenopathy. Additional mild lymphadenopathy within the anterior mediastinum and aortopulmonary window region. Lungs/Pleura: Numerous cavitary consolidations throughout both lungs, peripherally distributed suggesting septic emboli. More confluent consolidations at each lung base, more likely atelectasis than pneumonia. Small right pleural effusion. Upper Abdomen: Suspected  splenomegaly, incompletely imaged. Musculoskeletal: No acute or suspicious osseous finding. IMPRESSION: 1. Numerous cavitary consolidations throughout both lungs, with a peripheral distribution suggesting septic emboli. Largest cavitary consolidation is within the left lower lobe measuring approximately 4 cm greatest dimension. 2. Probable small pericardial effusion and/or pericardial wall thickening, difficult to delineate without IV contrast. 3. Probable mediastinal lymphadenopathy, again difficult to definitively characterize without IV contrast, alternatively confluent fluid or edema within the mediastinum. 4. Small right pleural effusion. 5. Probable splenomegaly, incompletely imaged at the lower aspects of this chest CT. 6. Right IJ line in place with tip passing through the right atrium and into the right ventricle. Recommend retracting to the cavoatrial junction for optimal radiographic positioning. These results and recommendations were called by telephone at the time of interpretation on 11/10/2016 at 7:35 pm to Dr. Katrinka Blazing , who verbally acknowledged these results. Electronically Signed   By: Bary Richard M.D.   On: 11/10/2016 19:36   Ct Abdomen Pelvis W Contrast  Result Date: 11/14/2016 CLINICAL DATA:  Right upper quadrant pain times 2-3 days. Polysubstance abuse. Cholecystectomy. History of endocarditis and septic emboli. EXAM: CT ABDOMEN AND PELVIS WITH CONTRAST TECHNIQUE: Multidetector CT imaging of the abdomen and pelvis was performed using the standard protocol following bolus administration of intravenous contrast. CONTRAST:  80mL ISOVUE-300 IOPAMIDOL (ISOVUE-300) INJECTION 61% COMPARISON:  03/12/2016 chest CT FINDINGS: Lower chest: Small right effusion with several bilateral cavitary opacities in both lower lobes concerning for septic emboli or cavitary pneumonia. More confluent airspace opacity in the left lower lobe is also noted. The visualized heart is normal in size. There is no pericardial  effusion. No large central pulmonary embolus. Hepatobiliary: Normal appearance of the liver without space-occupying mass or biliary dilatation. Cholecystectomy. Pancreas: No pancreatic mass or ductal dilatation. Mild enlargement of the pancreatic head with surrounding peripancreatic fluid is noted. Correlate to exclude a pancreatitis. Spleen: The spleen is enlarged measuring 15.7 x 5.6 x 17 cm (volume = 780 cm^3) and demonstrates a developmental cleft, partially visualized on prior chest CT and therefore believed less likely to represent a laceration. Adrenals/Urinary Tract: Adrenal glands are unremarkable. Kidneys are normal, without renal calculi, solid appearing lesions, or hydronephrosis. There appear to be pair of water attenuating cysts in the lower pole the right kidney measuring up to 2 x 1.3 cm in aggregate. Bladder is unremarkable. Stomach/Bowel: Stomach is within normal limits. Appendix appears normal. No evidence of bowel wall thickening, distention, or inflammatory changes. Vascular/Lymphatic: No significant vascular findings are present. No enlarged abdominal or pelvic lymph nodes. Reproductive: Uterus and bilateral adnexa are unremarkable. Other: Moderate volume of ascites.  Anasarca. Musculoskeletal: No acute or significant osseous findings. IMPRESSION: 1. Bibasilar, multifocal cavitary lesions noted within the visualized lower lobes consistent with septic emboli or possibly cavitary pneumonia. More confluent airspace disease in the left lower lobe without cavitation is also present. There is a small right pleural effusion. 2. Anasarca with moderate volume of ascites noted within the abdomen and pelvis. Question right heart dysfunction/failure. 3. Splenomegaly. 4. Water attenuating cysts in the lower pole the right kidney in aggregate measuring 2 x 1.3 cm. 5. Status post cholecystectomy. Electronically  Signed   By: Tollie Eth M.D.   On: 11/14/2016 16:26   Dg Chest 1v Repeat Same Day  Result  Date: 11/18/2016 CLINICAL DATA:  Chest tube placement EXAM: CHEST - 1 VIEW SAME DAY COMPARISON:  Earlier same day FINDINGS: Large bore chest tube placed on the right along the lateral margin. Marked reduction an amount of right pleural air, nearly completely evacuated. Patchy infiltrates persist in both lower lobes. IMPRESSION: Large bore chest tube placed. Near complete resolution of right pneumothorax. Electronically Signed   By: Paulina Fusi M.D.   On: 11/18/2016 11:40   Dg Chest Port 1 View  Result Date: 11/20/2016 CLINICAL DATA:  27 year old female with pneumothorax. Recent transesophageal echocardiogram. Subsequent encounter. EXAM: PORTABLE CHEST 1 VIEW COMPARISON:  11/20/2016 8:30 a.m. FINDINGS: Right-sided chest tube remains in place. No pneumothorax detected on this frontal projection. Cavitary lesions bilaterally. Left base consolidation. Atelectasis/pleural thickening right lung base. Cardiomegaly. IMPRESSION: No significant change since exam earlier today. Electronically Signed   By: Lacy Duverney M.D.   On: 11/20/2016 13:55   Dg Chest Port 1 View  Result Date: 11/20/2016 CLINICAL DATA:  Chest tube, shortness of Breath EXAM: PORTABLE CHEST 1 VIEW COMPARISON:  11/19/2016 FINDINGS: Right chest tube remains in place, unchanged. No pneumothorax. Patchy bilateral airspace opacities are again noted, some with cavitation. Small right pleural effusion. Heart is borderline in size. IMPRESSION: Right chest tube remains in place without pneumothorax. Stable patchy bilateral airspace disease, some of which is cavitary. Electronically Signed   By: Charlett Nose M.D.   On: 11/20/2016 08:42   Dg Chest Port 1 View  Result Date: 11/19/2016 CLINICAL DATA:  26 year old female with cavitary lung lesions possibly from septic endocarditis. Subsequent encounter. EXAM: PORTABLE CHEST 1 VIEW COMPARISON:  11/18/2016. FINDINGS: Right-sided chest tube remains in place. Tiny hydropneumothorax versus fluid within minor fissure  noted. Multiple cavitary lesions. Right-sided pleural effusion. Basilar atelectasis versus infiltrate greater on the left. Cardiomegaly. Mild scoliosis. IMPRESSION: Right-sided chest tube remains in place. Tiny hydropneumothorax versus fluid within minor fissure. Multiple cavitary lesions. Right-sided pleural effusion. Basilar atelectasis versus infiltrate greater on the left. Electronically Signed   By: Lacy Duverney M.D.   On: 11/19/2016 07:16   Dg Chest Port 1 View  Result Date: 11/18/2016 CLINICAL DATA:  Followup left pneumothorax. Patient with bacterial endocarditis. EXAM: PORTABLE CHEST 1 VIEW COMPARISON:  11/17/2016 and prior studies FINDINGS: A moderate right hydropneumothorax is unchanged in size, but now with pleural fluid in its basilar portion. A right thoracostomy tube is unchanged. Scattered pulmonary opacities/ nodules and left lower lung consolidations/atelectasis again noted. There has been no other interval change. IMPRESSION: Moderate right hydropneumothorax, unchanged in size but now with pleural fluid in its basilar portion. Right thoracostomy tube remains unchanged. Electronically Signed   By: Harmon Pier M.D.   On: 11/18/2016 10:14   Dg Chest Port 1 View  Result Date: 11/17/2016 CLINICAL DATA:  Follow-up right-sided chest tube placement. Initial encounter. EXAM: PORTABLE CHEST 1 VIEW COMPARISON:  Chest radiograph performed 11/16/2016 FINDINGS: The patient's moderate right-sided pneumothorax has decreased in size. The right-sided chest tube is grossly unchanged in appearance. Vascular congestion is noted. Patchy left-sided airspace opacities raise concern for pneumonia. Right-sided airspace opacity may reflect atelectasis. A small left pleural effusion is noted. The cardiomediastinal silhouette is mildly enlarged. No acute osseous abnormalities are identified. IMPRESSION: 1. Moderate right-sided pneumothorax has decreased in size. Right-sided chest tube is grossly unchanged in appearance.  2. Vascular congestion and mild  cardiomegaly. Patchy left-sided airspace opacities raise concern for pneumonia. Small left pleural effusion noted. 3. Right-sided airspace opacity may reflect atelectasis. Electronically Signed   By: Roanna Raider M.D.   On: 11/17/2016 00:33   Dg Chest Port 1 View  Result Date: 11/16/2016 CLINICAL DATA:  Chest tube placement for right pneumothorax. EXAM: PORTABLE CHEST 1 VIEW COMPARISON:  Single-view of the chest earlier today and 11/11/2016. FINDINGS: Pigtail catheter is now in place in the right chest. Large right pneumothorax is unchanged. Patchy airspace disease in cavitary lesions in the left chest persist. Heart size is normal. IMPRESSION: No change in large right pneumothorax after chest tube placement. No change in patchy airspace disease in cavitary lesions on the left. These results were called by telephone at the time of interpretation on 11/16/2016 at 8:10 pm to Estrella Deeds, RN, who verbally acknowledged these results. Electronically Signed   By: Drusilla Kanner M.D.   On: 11/16/2016 20:12   Dg Chest Port 1 View  Result Date: 11/16/2016 CLINICAL DATA:  Shortness of Breath EXAM: PORTABLE CHEST 1 VIEW COMPARISON:  11/11/2016 FINDINGS: Cardiac shadow is within normal limits. Cavitary nodular densities are noted throughout both lungs similar to that seen on previous CT examination. A new right-sided moderately large pneumothorax is noted with consolidation of the lower lobe on the right. No acute bony abnormality is noted. IMPRESSION: Moderately large right-sided pneumothorax new from the prior exam. Near complete collapse of the right lower lobe is noted. Stable cavitary lesions within both lungs. Critical Value/emergent results were called by telephone at the time of interpretation on 11/16/2016 at 6:24 pm to Dr Leitha Bleak, who verbally acknowledged these results. Electronically Signed   By: Alcide Clever M.D.   On: 11/16/2016 18:27   Dg Chest Port 1 View  Result Date:  11/11/2016 CLINICAL DATA:  Check right IJ line placement.  Initial encounter. EXAM: PORTABLE CHEST 1 VIEW COMPARISON:  Chest radiograph performed 11/10/2016 FINDINGS: The right IJ line is noted ending at the right atrium. This could be retracted 2-3 cm. Mild vascular congestion is noted. Mild left-sided opacities may reflect atelectasis or mild pneumonia. No pleural effusion or pneumothorax is seen. The cardiomediastinal silhouette is borderline normal in size. No acute osseous abnormalities are identified. IMPRESSION: 1. Right IJ line noted ending at the right atrium. This could be retracted 2-3 cm, as deemed clinically appropriate. 2. Mild vascular congestion noted. 3. Mild left-sided airspace opacities may reflect atelectasis or mild pneumonia. Electronically Signed   By: Roanna Raider M.D.   On: 11/11/2016 00:37   Dg Chest Port 1 View  Result Date: 11/10/2016 CLINICAL DATA:  Retraction of the IJ line about 10 cm EXAM: PORTABLE CHEST 1 VIEW COMPARISON:  CT chest 11/10/2016.  Chest 11/10/2016. FINDINGS: The left central venous catheter tip localizes to the mid/distal right atrial region. If placement at or above the cavoatrial junction is desired, retraction of about 3-4 cm is recommended. No pneumothorax. Shallow inspiration with atelectasis in the lung bases. Borderline heart size. Patchy airspace infiltrates in both lungs could indicate edema, aspiration, or pneumonia. No blunting of costophrenic angles. IMPRESSION: Central venous catheter tip is in the mid/ low right atrial region. If placement at or above the cavoatrial junction is desired, retraction of about 3-4 cm is recommended. Electronically Signed   By: Burman Nieves M.D.   On: 11/10/2016 21:35   Dg Chest Portable 1 View  Result Date: 11/10/2016 CLINICAL DATA:  Status post central line placement. EXAM: PORTABLE CHEST 1  VIEW COMPARISON:  Chest x-ray from earlier same day. FINDINGS: Interval placement of a right IJ central line with tip  passing to the left of midline and likely in the right ventricle or main pulmonary artery. The bilateral small nodular airspace opacities throughout both lungs are unchanged in the short-term interval. Suspect small bilateral pleural effusions. No pneumothorax seen. IMPRESSION: 1. Right IJ central line placement with tip positioned to the left of midline either within the right ventricle or main pulmonary artery. Recommend retracting at least 10 cm. 2. Bilateral small nodular airspace opacities throughout both lungs are unchanged in the short-term interval. As recommended on earlier chest x-ray report, would consider chest CT for further characterization. 3. Suspect small bilateral pleural effusions. Electronically Signed   By: Bary Richard M.D.   On: 11/10/2016 18:37    Lab Data:  CBC:  Recent Labs Lab 11/28/16 0405 11/29/16 0409 11/30/16 0440  WBC 6.8 6.6 7.6  HGB 9.7* 9.4* 9.9*  HCT 30.4* 29.4* 31.2*  MCV 86.6 87.5 87.6  PLT 329 317 327   Basic Metabolic Panel:  Recent Labs Lab 11/27/16 0400 11/28/16 0405 11/29/16 0409 11/30/16 0440 12/01/16 0349 12/02/16 0432 12/03/16 0428  NA 138 138  --   --  138  --  138  K 3.3* 3.5  --   --  3.4*  --  3.5  CL 104 105  --   --  104  --  103  CO2 27 26  --   --  28  --  28  GLUCOSE 94 98  --   --  95  --  92  BUN 6 8  --   --  10  --  9  CREATININE 0.79 0.78 0.71 0.78 0.68 0.63 0.63  CALCIUM 8.4* 8.0*  --   --  8.5*  --  8.9   GFR: Estimated Creatinine Clearance: 88.2 mL/min (by C-G formula based on SCr of 0.63 mg/dL). Liver Function Tests: No results for input(s): AST, ALT, ALKPHOS, BILITOT, PROT, ALBUMIN in the last 168 hours. No results for input(s): LIPASE, AMYLASE in the last 168 hours. No results for input(s): AMMONIA in the last 168 hours. Coagulation Profile: No results for input(s): INR, PROTIME in the last 168 hours. Cardiac Enzymes: No results for input(s): CKTOTAL, CKMB, CKMBINDEX, TROPONINI in the last 168  hours. BNP (last 3 results) No results for input(s): PROBNP in the last 8760 hours. HbA1C: No results for input(s): HGBA1C in the last 72 hours. CBG: No results for input(s): GLUCAP in the last 168 hours. Lipid Profile: No results for input(s): CHOL, HDL, LDLCALC, TRIG, CHOLHDL, LDLDIRECT in the last 72 hours. Thyroid Function Tests: No results for input(s): TSH, T4TOTAL, FREET4, T3FREE, THYROIDAB in the last 72 hours. Anemia Panel: No results for input(s): VITAMINB12, FOLATE, FERRITIN, TIBC, IRON, RETICCTPCT in the last 72 hours. Urine analysis:    Component Value Date/Time   COLORURINE YELLOW 11/10/2016 2132   APPEARANCEUR CLEAR 11/10/2016 2132   LABSPEC 1.006 11/10/2016 2132   PHURINE 5.0 11/10/2016 2132   GLUCOSEU NEGATIVE 11/10/2016 2132   HGBUR MODERATE (A) 11/10/2016 2132   BILIRUBINUR NEGATIVE 11/10/2016 2132   KETONESUR NEGATIVE 11/10/2016 2132   PROTEINUR NEGATIVE 11/10/2016 2132   UROBILINOGEN 0.2 08/05/2010 1821   NITRITE NEGATIVE 11/10/2016 2132   LEUKOCYTESUR SMALL (A) 11/10/2016 2132     Khalise Billard M.D. [facility] 12/03/2016, 12:53 PM  Contact via [email or web address], password [email or web address] Call night coverage person covering after 7pm

## 2016-12-04 LAB — CREATININE, SERUM
CREATININE: 0.65 mg/dL (ref 0.44–1.00)
GFR calc non Af Amer: >60 mL/min (ref >60)

## 2016-12-04 NOTE — Progress Notes (Signed)
[facility]                                                                              Patient Demographics  Savannah Palmer, is a 26 y.o. female, DOB - 07/05/1990, ZOX:[id number]  Admit date - 11/10/2016   Admitting Physician Coralyn Helling, MD  Outpatient Primary MD for the patient is Patient, No Pcp Per  Outpatient specialists:   LOS - 24  days   Medical records reviewed and are as summarized below:    No chief complaint on file.      Brief summary    26 year old Caucasian female with a past medical history of anxiety, depression, heroin abuse, presented with weakness, cough and chest pain. Initially was in septic shock and required pressors. Blood cultures grew MRSA. 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 She was placed on IV antibiotics. She also developed a spontaneous right-sided pneumothorax. Chest tube was placed by critical care medicine. She was transferred to [facility] for opinion from cardiothoracic surgery. Patient is not a candidate for surgery at this time. PTX, resolved, remains stable,  inpatient on IV Vanc and Po suboxone till 7/15   Assessment & Plan    Principal Problem: MRSA bacteremia/Septic shock with right TV endocarditis and septic emboli to lungs/MRSA empyema - Patient presented with weakness, coughing, chest pain and septic shock requiring vasopressors. Blood cultures 5/27 grew out MRSA and was found to have septic emboli throughout her lungs.  -Repeat cultures were still positive, blood cultures repeat on 6/4 has been negative to date  - 2-D echo on 5/27 showed tricuspid vegetations with moderate TR. TEE on 6/1 showed large tricuspid vegetation, severe TR with dilated RV along with a loculated left effusion - HIV, Hep C negative. Due to family request, RPR was done and it was negative. - Due to persistent bacteremia patient was started on daptomycin  6/2 and Ceftaroline was continued to cover lung infection. Per ID recommendations, Ceftaroline and daptomycin have been now discontinued. - cardiothoracic surgery was also consulted and patient was deemed not a candidate for surgery at this time.  - on vancomycin duration through July 15, will need BMP, CBC weekly, trough goal of 15-20.  - PICC line placed on 6/9, continue vancomycin, until July 15 -will likely remain inpatient to complete Abx course -remains stable   Right spontaneous pneumothorax- acute respiratory failure/MRSA Empyema -Right empyema with pneumothorax, positive for MRSA. Status post chest tube. - Chest tube removed on 6/7, chest x-ray clear  -resolved  Acute kidney injury -Baseline creatinine 0.7, creatinine increased to 1.37, likely secondary to dehydration and NSAIDs. - patient received IV fluids, NSAIDs were discontinued, and creatinine function has normalized.   RUQ pain, likely musculoskeletal -CT abdomen and pelvis showed no liver or gallbladder abnormalities, possible musculoskeletal origin, currently stable.    Ascites, anasarca due to right sided hear failure - as a result of severe TR, net positive balance of 12L - O2 sats 96% on room air -on low dose lasix for edema  Splenomegaly - etiology undetermined- CT on 5/30 revealed  that the spleen is enlarged measuring 15.7 x 5.6 x 17 cm (volume= 780 cm^3) - liver is unremarkable on CT  Normocytic Anemia - H&H currently stable, was transfused on 6/5, no overt bleeding noted - Iron was 12. Ferritin 213. B-12 1850. Folate 7.8. - Hemoglobin down to 7.1 on 6/10, patient was transfused 2 units packed RBC with 1 dose of IV Feraheme, currently stable  Hypokalemia/Hypophosphatemia/Hypomagnesemia - Potassium 3.3, replaced  Thrombocytopenia - Resolved  Heroin abuse - cont Suboxone, will need transition to a drug rehab program once stable.  Anxiety - Celexa discontinued per patient's request. Buspar  restarted again today per patient's request.  - continue Neurontin and Xanax as needed at this time.   Nicotine dependence -Continue  Nicoderm patch  Code Status: Full CODE STATUS  DVT Prophylaxis:  heparin  Family Communication: none at bedside Disposition Plan: Disposition difficulty due to patient being on suboxone and history of IVDA, will likely be inpatient till 7/15  Time Spent in minutes  25 minutes  Procedures:  TEE  - Left ventricle: The cavity size was normal. Wall thickness was normal. Systolic function was normal. The estimated ejection fraction was in the range of 55% to 60%. Wall motion was normal; there were no regional wall motion abnormalities. - Left atrium: No evidence of thrombus in the atrial cavity or appendage. - Right ventricle: The cavity size was mildly dilated. Wall thickness was normal. - Right atrium: No evidence of thrombus in the atrial cavity or appendage. - Tricuspid valve: Flail motion of the posterior leaflet. There is a 8x5 mm vegetation attached to the posterior leaflet, but most of the &quot;mass&quot; described on transthoracic imaging appears to be the flail segment of the valve. There was severe regurgitation directed eccentrically and toward the free wall.  Right chest tube placement for pneumothorax 6/1  Placement of a larger bore chest tube in the right chest 6/3  PICC line placed on 6/9  Consultants:   Infectious disease Cardiothoracic surgery  Antimicrobials:   Daptomycin 6/2> 6/8  Teflaro 5/31> 6/8  Vancomycin 6/8>> will need till July 15   Medications  Scheduled Meds: . buprenorphine-naloxone  2 tablet Sublingual Daily  . busPIRone  10 mg Oral BID  . furosemide  20 mg Oral Daily  . gabapentin  300 mg Oral TID  . heparin  5,000 Units Subcutaneous Q8H  . nicotine  14 mg Transdermal Daily  . potassium chloride  40 mEq Oral Daily  . saccharomyces boulardii  250 mg Oral BID   Continuous  Infusions: . sodium chloride 250 mL (11/28/16 2032)  . vancomycin Stopped (12/04/16 1029)   PRN Meds:.sodium chloride, acetaminophen, albuterol, ALPRAZolam, docusate sodium, ibuprofen, nicotine polacrilex, ondansetron (ZOFRAN) IV, oxyCODONE, sodium chloride flush   Antibiotics   Anti-infectives    Start     Dose/Rate Route Frequency Ordered Stop   11/29/16 1000  vancomycin (VANCOCIN) IVPB 750 mg/150 ml premix     750 mg 150 mL/hr over 60 Minutes Intravenous Every 12 hours 11/29/16 0934     11/26/16 2000  vancomycin (VANCOCIN) IVPB 1000 mg/200 mL premix  Status:  Discontinued     1,000 mg 200 mL/hr over 60 Minutes Intravenous Every 12 hours 11/26/16 1912 11/29/16 0853   11/24/16 0000  vancomycin (VANCOCIN) IVPB 1000 mg/200 mL premix  Status:  Discontinued     1,000 mg 200 mL/hr over 60 Minutes Intravenous Every 8 hours 11/23/16 1238 11/26/16 0721   11/23/16 1600  vancomycin (VANCOCIN) 1,500 mg in sodium  chloride 0.9 % 500 mL IVPB     1,500 mg 250 mL/hr over 120 Minutes Intravenous  Once 11/23/16 1238 11/23/16 1733   11/17/16 1600  DAPTOmycin (CUBICIN) 500 mg in sodium chloride 0.9 % IVPB  Status:  Discontinued     500 mg 220 mL/hr over 30 Minutes Intravenous Every 24 hours 11/17/16 1432 11/23/16 1238   11/17/16 1500  ceftaroline (TEFLARO) 600 mg in sodium chloride 0.9 % 250 mL IVPB  Status:  Discontinued     600 mg 250 mL/hr over 60 Minutes Intravenous Every 12 hours 11/17/16 1435 11/23/16 1238   11/15/16 1800  ceftaroline (TEFLARO) 600 mg in sodium chloride 0.9 % 250 mL IVPB  Status:  Discontinued     600 mg 250 mL/hr over 60 Minutes Intravenous Every 12 hours 11/15/16 1635 11/17/16 1431   11/13/16 1215  vancomycin (VANCOCIN) IVPB 1000 mg/200 mL premix  Status:  Discontinued     1,000 mg 200 mL/hr over 60 Minutes Intravenous Every 8 hours 11/13/16 1203 11/16/16 0850   11/11/16 1100  vancomycin (VANCOCIN) IVPB 750 mg/150 ml premix  Status:  Discontinued     750 mg 150 mL/hr over  60 Minutes Intravenous Every 8 hours 11/11/16 0956 11/13/16 1203   11/11/16 0400  vancomycin (VANCOCIN) 500 mg in sodium chloride 0.9 % 100 mL IVPB  Status:  Discontinued     500 mg 100 mL/hr over 60 Minutes Intravenous Every 12 hours 11/10/16 1951 11/11/16 0956   11/10/16 2200  ceFEPIme (MAXIPIME) 1 g in dextrose 5 % 50 mL IVPB  Status:  Discontinued     1 g 100 mL/hr over 30 Minutes Intravenous Every 12 hours 11/10/16 1951 11/11/16 0926   11/10/16 2000  ceFEPIme (MAXIPIME) 2 g in dextrose 5 % 50 mL IVPB  Status:  Discontinued     2 g 100 mL/hr over 30 Minutes Intravenous  Once 11/10/16 1952 11/10/16 1954   11/10/16 2000  vancomycin (VANCOCIN) IVPB 1000 mg/200 mL premix  Status:  Discontinued     1,000 mg 200 mL/hr over 60 Minutes Intravenous  Once 11/10/16 1952 11/10/16 1954   11/10/16 1430  piperacillin-tazobactam (ZOSYN) IVPB 3.375 g     3.375 g 100 mL/hr over 30 Minutes Intravenous  Once 11/10/16 1426 11/10/16 1503   11/10/16 1430  vancomycin (VANCOCIN) IVPB 1000 mg/200 mL premix     1,000 mg 200 mL/hr over 60 Minutes Intravenous  Once 11/10/16 1426 11/10/16 1653        Subjective:  Denies any complaints, no N/V, no dyspnea  Objective:   Vitals:   12/03/16 0703 12/03/16 1308 12/03/16 2133 12/04/16 0555  BP:  126/89 (!) 138/94 127/84  Pulse:  71 (!) 59 63  Resp:  18 20 16   Temp:  98.5 F (36.9 C) 98.5 F (36.9 C) 98 F (36.7 C)  TempSrc:      SpO2:  96% 98% 98%  Weight: 62.1 kg (136 lb 12.8 oz)     Height:        Intake/Output Summary (Last 24 hours) at 12/04/16 1141 Last data filed at 12/03/16 2225  Gross per 24 hour  Intake              150 ml  Output                0 ml  Net              150 ml     Wt Readings from Last  3 Encounters:  12/03/16 62.1 kg (136 lb 12.8 oz)  03/14/16 58.5 kg (129 lb)  01/19/16 58.1 kg (128 lb)    Gen: Awake, Alert, Oriented X 3,  HEENT: PERRLA, Neck supple, no JVD Lungs: Good air movement bilaterally, CTAB CVS: RRR,  systolic murmur Abd: soft, Non tender, non distended, BS present Extremities: No Cyanosis, Clubbing or edema Skin: no new rashes   Data Reviewed:  I have personally reviewed following labs and imaging studies  Micro Results No results found for this or any previous visit (from the past 240 hour(s)).  Radiology Reports Dg Chest 2 View  Result Date: 11/23/2016 CLINICAL DATA:  Chest tube removal EXAM: CHEST  2 VIEW COMPARISON:  11/21/2016 FINDINGS: Interval removal of right chest tube with residual small right pleural effusion versus pleural thickening. No pneumothorax. Patchy bilateral airspace opacities are again noted, unchanged. Heart is borderline in size. IMPRESSION: Interval removal of right chest tube without pneumothorax. Otherwise no change. Electronically Signed   By: Charlett Nose M.D.   On: 11/23/2016 08:44   Dg Chest 2 View  Result Date: 11/21/2016 CLINICAL DATA:  Chest soreness.  Chest tube . EXAM: CHEST  2 VIEW COMPARISON:  CT 11/20/2016.  Chest x-ray 11/20/2016 FINDINGS: Chest tube noted on the right. Tiny right anterior hydropneumothorax again noted. No interim change . Stable cardiomegaly. Persistent bilateral pulmonary nodules including cavitary nodules. Reference is made to prior CT report. Small right pleural effusion. No pneumothorax. IMPRESSION: 1. Right chest tube in stable position. Tiny right anterior hydropneumothorax again noted. No interim change. 2. Multiple bilateral pulmonary nodules with cavitation. Reference is made to prior CT report of 11/20/2016 . Electronically Signed   By: Maisie Fus  Register   On: 11/21/2016 11:15   Dg Chest 2 View  Result Date: 11/19/2016 CLINICAL DATA:  Pneumothorax. EXAM: CHEST  2 VIEW COMPARISON:  11/19/2016. FINDINGS: Right chest tube in stable position. No significant pneumothorax noted on today's exam. Small right pleural effusion. Bilateral patchy pulmonary infiltrates with cavitation again noted. Small left pleural effusion noted. Heart  size stable. No acute bony abnormality . IMPRESSION: 1. Right chest tube in stable position. No pneumothorax noted on today's exam. Small right pleural effusion. 2. Persistent bilateral cavitary pulmonary infiltrates. Small left pleural effusion. Electronically Signed   By: Maisie Fus  Register   On: 11/19/2016 16:53   Dg Chest 2 View  Result Date: 11/10/2016 CLINICAL DATA:  Chest pain, shortness of breath for 1 week EXAM: CHEST  2 VIEW COMPARISON:  CT chest 03/12/2016, chest x-ray 03/12/2016 FINDINGS: There are bilateral nodular airspace opacities throughout the upper and lower lobes bilaterally. There is no pleural effusion or pneumothorax. The heart and mediastinal contours are unremarkable. The osseous structures are unremarkable. IMPRESSION: New bilateral nodular airspace opacities throughout the upper and lower lobes bilaterally. Findings are concerning for multifocal infection such as septic emboli. Recommend further evaluation with a CT of the chest with intravenous contrast. Electronically Signed   By: Elige Ko   On: 11/10/2016 13:23   Ct Chest Wo Contrast  Result Date: 11/20/2016 CLINICAL DATA:  Follow-up right pleural effusion EXAM: CT CHEST WITHOUT CONTRAST TECHNIQUE: Multidetector CT imaging of the chest was performed following the standard protocol without IV contrast. COMPARISON:  Radiograph 11/20/2016, CT chest 11/10/2016, prior radiographs dating back to 11/10/2016 FINDINGS: Cardiovascular: Limited assessment without intravenous contrast. Small fluid in the superior pericardial recess. There is cardiomegaly. Mediastinum/Nodes: Difficult evaluation without contrast. Mediastinal adenopathy is present right paratracheal soft tissue fullness, possible adenopathy  does not appear significantly changed. Midline trachea. Esophagus grossly unremarkable. Lungs/Pleura: Interim placement of a right lower lateral chest tube since the prior CT with the tip terminating along the right upper posterior  pleural surface. Small bilateral pleural effusions right greater than left, slightly increased compared to the previous CT. Tiny right anterior hydropneumothorax. Multiple bilateral pulmonary nodules and cavitary lesions. Some of the cavitary lesions have decreased, however if there appears to be increased nodules/disease most notable in the left upper lobe and the right lower lobe. Decreased septal thickening compared to the previous exam. Upper Abdomen: Spleen appears enlarged. Partially visualized surgical clips in the gallbladder fossa. Musculoskeletal: No acute or suspicious bone lesion. IMPRESSION: 1. Interim placement of right-sided chest tube. Small residual pleural effusions right greater than left, mildly increased compared to the prior chest CT. Small right anterior hydropneumothorax. 2. Innumerable bilateral lung nodules and cavitary lesions, suspected to represent septic emboli. This appears increased in the left upper and right lower lobe since the prior CT. 3. Suspect mediastinal adenopathy although evaluation of the mediastinum is limited without contrast 4. Splenomegaly Electronically Signed   By: Jasmine Pang M.D.   On: 11/20/2016 21:25   Ct Chest Wo Contrast  Result Date: 11/10/2016 CLINICAL DATA:  She c/o some chest discomfrot and shortness of breath x 1 week. She states these are similar s/s she had a year ago, at which time she was dx with "pulmonary abscess". She is in no distress. IV drug user EXAM: CT CHEST WITHOUT CONTRAST TECHNIQUE: Multidetector CT imaging of the chest was performed following the standard protocol without IV contrast. COMPARISON:  Chest CT angiogram dated 03/12/2016. FINDINGS: Cardiovascular: Probable small pericardial effusion and/or pericardial thickening, difficult to characterize without IV contrast. Heart size is within normal limits. Thoracic aorta appears to be normal in caliber. Mediastinum/Nodes: Soft tissue density material within the right upper  peritracheal space, also difficult to characterize without IV contrast, presumed lymphadenopathy. Additional mild lymphadenopathy within the anterior mediastinum and aortopulmonary window region. Lungs/Pleura: Numerous cavitary consolidations throughout both lungs, peripherally distributed suggesting septic emboli. More confluent consolidations at each lung base, more likely atelectasis than pneumonia. Small right pleural effusion. Upper Abdomen: Suspected splenomegaly, incompletely imaged. Musculoskeletal: No acute or suspicious osseous finding. IMPRESSION: 1. Numerous cavitary consolidations throughout both lungs, with a peripheral distribution suggesting septic emboli. Largest cavitary consolidation is within the left lower lobe measuring approximately 4 cm greatest dimension. 2. Probable small pericardial effusion and/or pericardial wall thickening, difficult to delineate without IV contrast. 3. Probable mediastinal lymphadenopathy, again difficult to definitively characterize without IV contrast, alternatively confluent fluid or edema within the mediastinum. 4. Small right pleural effusion. 5. Probable splenomegaly, incompletely imaged at the lower aspects of this chest CT. 6. Right IJ line in place with tip passing through the right atrium and into the right ventricle. Recommend retracting to the cavoatrial junction for optimal radiographic positioning. These results and recommendations were called by telephone at the time of interpretation on 11/10/2016 at 7:35 pm to Dr. Katrinka Blazing , who verbally acknowledged these results. Electronically Signed   By: Bary Richard M.D.   On: 11/10/2016 19:36   Ct Abdomen Pelvis W Contrast  Result Date: 11/14/2016 CLINICAL DATA:  Right upper quadrant pain times 2-3 days. Polysubstance abuse. Cholecystectomy. History of endocarditis and septic emboli. EXAM: CT ABDOMEN AND PELVIS WITH CONTRAST TECHNIQUE: Multidetector CT imaging of the abdomen and pelvis was performed using the  standard protocol following bolus administration of intravenous contrast. CONTRAST:  80mL ISOVUE-300  IOPAMIDOL (ISOVUE-300) INJECTION 61% COMPARISON:  03/12/2016 chest CT FINDINGS: Lower chest: Small right effusion with several bilateral cavitary opacities in both lower lobes concerning for septic emboli or cavitary pneumonia. More confluent airspace opacity in the left lower lobe is also noted. The visualized heart is normal in size. There is no pericardial effusion. No large central pulmonary embolus. Hepatobiliary: Normal appearance of the liver without space-occupying mass or biliary dilatation. Cholecystectomy. Pancreas: No pancreatic mass or ductal dilatation. Mild enlargement of the pancreatic head with surrounding peripancreatic fluid is noted. Correlate to exclude a pancreatitis. Spleen: The spleen is enlarged measuring 15.7 x 5.6 x 17 cm (volume = 780 cm^3) and demonstrates a developmental cleft, partially visualized on prior chest CT and therefore believed less likely to represent a laceration. Adrenals/Urinary Tract: Adrenal glands are unremarkable. Kidneys are normal, without renal calculi, solid appearing lesions, or hydronephrosis. There appear to be pair of water attenuating cysts in the lower pole the right kidney measuring up to 2 x 1.3 cm in aggregate. Bladder is unremarkable. Stomach/Bowel: Stomach is within normal limits. Appendix appears normal. No evidence of bowel wall thickening, distention, or inflammatory changes. Vascular/Lymphatic: No significant vascular findings are present. No enlarged abdominal or pelvic lymph nodes. Reproductive: Uterus and bilateral adnexa are unremarkable. Other: Moderate volume of ascites.  Anasarca. Musculoskeletal: No acute or significant osseous findings. IMPRESSION: 1. Bibasilar, multifocal cavitary lesions noted within the visualized lower lobes consistent with septic emboli or possibly cavitary pneumonia. More confluent airspace disease in the left lower  lobe without cavitation is also present. There is a small right pleural effusion. 2. Anasarca with moderate volume of ascites noted within the abdomen and pelvis. Question right heart dysfunction/failure. 3. Splenomegaly. 4. Water attenuating cysts in the lower pole the right kidney in aggregate measuring 2 x 1.3 cm. 5. Status post cholecystectomy. Electronically Signed   By: Tollie Eth M.D.   On: 11/14/2016 16:26   Dg Chest 1v Repeat Same Day  Result Date: 11/18/2016 CLINICAL DATA:  Chest tube placement EXAM: CHEST - 1 VIEW SAME DAY COMPARISON:  Earlier same day FINDINGS: Large bore chest tube placed on the right along the lateral margin. Marked reduction an amount of right pleural air, nearly completely evacuated. Patchy infiltrates persist in both lower lobes. IMPRESSION: Large bore chest tube placed. Near complete resolution of right pneumothorax. Electronically Signed   By: Paulina Fusi M.D.   On: 11/18/2016 11:40   Dg Chest Port 1 View  Result Date: 11/20/2016 CLINICAL DATA:  26 year old female with pneumothorax. Recent transesophageal echocardiogram. Subsequent encounter. EXAM: PORTABLE CHEST 1 VIEW COMPARISON:  11/20/2016 8:30 a.m. FINDINGS: Right-sided chest tube remains in place. No pneumothorax detected on this frontal projection. Cavitary lesions bilaterally. Left base consolidation. Atelectasis/pleural thickening right lung base. Cardiomegaly. IMPRESSION: No significant change since exam earlier today. Electronically Signed   By: Lacy Duverney M.D.   On: 11/20/2016 13:55   Dg Chest Port 1 View  Result Date: 11/20/2016 CLINICAL DATA:  Chest tube, shortness of Breath EXAM: PORTABLE CHEST 1 VIEW COMPARISON:  11/19/2016 FINDINGS: Right chest tube remains in place, unchanged. No pneumothorax. Patchy bilateral airspace opacities are again noted, some with cavitation. Small right pleural effusion. Heart is borderline in size. IMPRESSION: Right chest tube remains in place without pneumothorax. Stable  patchy bilateral airspace disease, some of which is cavitary. Electronically Signed   By: Charlett Nose M.D.   On: 11/20/2016 08:42   Dg Chest Port 1 View  Result Date: 11/19/2016 CLINICAL DATA:  26 year old female with cavitary lung lesions possibly from septic endocarditis. Subsequent encounter. EXAM: PORTABLE CHEST 1 VIEW COMPARISON:  11/18/2016. FINDINGS: Right-sided chest tube remains in place. Tiny hydropneumothorax versus fluid within minor fissure noted. Multiple cavitary lesions. Right-sided pleural effusion. Basilar atelectasis versus infiltrate greater on the left. Cardiomegaly. Mild scoliosis. IMPRESSION: Right-sided chest tube remains in place. Tiny hydropneumothorax versus fluid within minor fissure. Multiple cavitary lesions. Right-sided pleural effusion. Basilar atelectasis versus infiltrate greater on the left. Electronically Signed   By: Lacy Duverney M.D.   On: 11/19/2016 07:16   Dg Chest Port 1 View  Result Date: 11/18/2016 CLINICAL DATA:  Followup left pneumothorax. Patient with bacterial endocarditis. EXAM: PORTABLE CHEST 1 VIEW COMPARISON:  11/17/2016 and prior studies FINDINGS: A moderate right hydropneumothorax is unchanged in size, but now with pleural fluid in its basilar portion. A right thoracostomy tube is unchanged. Scattered pulmonary opacities/ nodules and left lower lung consolidations/atelectasis again noted. There has been no other interval change. IMPRESSION: Moderate right hydropneumothorax, unchanged in size but now with pleural fluid in its basilar portion. Right thoracostomy tube remains unchanged. Electronically Signed   By: Harmon Pier M.D.   On: 11/18/2016 10:14   Dg Chest Port 1 View  Result Date: 11/17/2016 CLINICAL DATA:  Follow-up right-sided chest tube placement. Initial encounter. EXAM: PORTABLE CHEST 1 VIEW COMPARISON:  Chest radiograph performed 11/16/2016 FINDINGS: The patient's moderate right-sided pneumothorax has decreased in size. The right-sided chest  tube is grossly unchanged in appearance. Vascular congestion is noted. Patchy left-sided airspace opacities raise concern for pneumonia. Right-sided airspace opacity may reflect atelectasis. A small left pleural effusion is noted. The cardiomediastinal silhouette is mildly enlarged. No acute osseous abnormalities are identified. IMPRESSION: 1. Moderate right-sided pneumothorax has decreased in size. Right-sided chest tube is grossly unchanged in appearance. 2. Vascular congestion and mild cardiomegaly. Patchy left-sided airspace opacities raise concern for pneumonia. Small left pleural effusion noted. 3. Right-sided airspace opacity may reflect atelectasis. Electronically Signed   By: Roanna Raider M.D.   On: 11/17/2016 00:33   Dg Chest Port 1 View  Result Date: 11/16/2016 CLINICAL DATA:  Chest tube placement for right pneumothorax. EXAM: PORTABLE CHEST 1 VIEW COMPARISON:  Single-view of the chest earlier today and 11/11/2016. FINDINGS: Pigtail catheter is now in place in the right chest. Large right pneumothorax is unchanged. Patchy airspace disease in cavitary lesions in the left chest persist. Heart size is normal. IMPRESSION: No change in large right pneumothorax after chest tube placement. No change in patchy airspace disease in cavitary lesions on the left. These results were called by telephone at the time of interpretation on 11/16/2016 at 8:10 pm to Estrella Deeds, RN, who verbally acknowledged these results. Electronically Signed   By: Drusilla Kanner M.D.   On: 11/16/2016 20:12   Dg Chest Port 1 View  Result Date: 11/16/2016 CLINICAL DATA:  Shortness of Breath EXAM: PORTABLE CHEST 1 VIEW COMPARISON:  11/11/2016 FINDINGS: Cardiac shadow is within normal limits. Cavitary nodular densities are noted throughout both lungs similar to that seen on previous CT examination. A new right-sided moderately large pneumothorax is noted with consolidation of the lower lobe on the right. No acute bony abnormality is  noted. IMPRESSION: Moderately large right-sided pneumothorax new from the prior exam. Near complete collapse of the right lower lobe is noted. Stable cavitary lesions within both lungs. Critical Value/emergent results were called by telephone at the time of interpretation on 11/16/2016 at 6:24 pm to Dr Leitha Bleak, who verbally acknowledged these results.  Electronically Signed   By: Alcide Clever M.D.   On: 11/16/2016 18:27   Dg Chest Port 1 View  Result Date: 11/11/2016 CLINICAL DATA:  Check right IJ line placement.  Initial encounter. EXAM: PORTABLE CHEST 1 VIEW COMPARISON:  Chest radiograph performed 11/10/2016 FINDINGS: The right IJ line is noted ending at the right atrium. This could be retracted 2-3 cm. Mild vascular congestion is noted. Mild left-sided opacities may reflect atelectasis or mild pneumonia. No pleural effusion or pneumothorax is seen. The cardiomediastinal silhouette is borderline normal in size. No acute osseous abnormalities are identified. IMPRESSION: 1. Right IJ line noted ending at the right atrium. This could be retracted 2-3 cm, as deemed clinically appropriate. 2. Mild vascular congestion noted. 3. Mild left-sided airspace opacities may reflect atelectasis or mild pneumonia. Electronically Signed   By: Roanna Raider M.D.   On: 11/11/2016 00:37   Dg Chest Port 1 View  Result Date: 11/10/2016 CLINICAL DATA:  Retraction of the IJ line about 10 cm EXAM: PORTABLE CHEST 1 VIEW COMPARISON:  CT chest 11/10/2016.  Chest 11/10/2016. FINDINGS: The left central venous catheter tip localizes to the mid/distal right atrial region. If placement at or above the cavoatrial junction is desired, retraction of about 3-4 cm is recommended. No pneumothorax. Shallow inspiration with atelectasis in the lung bases. Borderline heart size. Patchy airspace infiltrates in both lungs could indicate edema, aspiration, or pneumonia. No blunting of costophrenic angles. IMPRESSION: Central venous catheter tip is in  the mid/ low right atrial region. If placement at or above the cavoatrial junction is desired, retraction of about 3-4 cm is recommended. Electronically Signed   By: Burman Nieves M.D.   On: 11/10/2016 21:35   Dg Chest Portable 1 View  Result Date: 11/10/2016 CLINICAL DATA:  Status post central line placement. EXAM: PORTABLE CHEST 1 VIEW COMPARISON:  Chest x-ray from earlier same day. FINDINGS: Interval placement of a right IJ central line with tip passing to the left of midline and likely in the right ventricle or main pulmonary artery. The bilateral small nodular airspace opacities throughout both lungs are unchanged in the short-term interval. Suspect small bilateral pleural effusions. No pneumothorax seen. IMPRESSION: 1. Right IJ central line placement with tip positioned to the left of midline either within the right ventricle or main pulmonary artery. Recommend retracting at least 10 cm. 2. Bilateral small nodular airspace opacities throughout both lungs are unchanged in the short-term interval. As recommended on earlier chest x-ray report, would consider chest CT for further characterization. 3. Suspect small bilateral pleural effusions. Electronically Signed   By: Bary Richard M.D.   On: 11/10/2016 18:37    Lab Data:  CBC:  Recent Labs Lab 11/28/16 0405 11/29/16 0409 11/30/16 0440  WBC 6.8 6.6 7.6  HGB 9.7* 9.4* 9.9*  HCT 30.4* 29.4* 31.2*  MCV 86.6 87.5 87.6  PLT 329 317 327   Basic Metabolic Panel:  Recent Labs Lab 11/28/16 0405  11/30/16 0440 12/01/16 0349 12/02/16 0432 12/03/16 0428 12/04/16 0446  NA 138  --   --  138  --  138  --   K 3.5  --   --  3.4*  --  3.5  --   CL 105  --   --  104  --  103  --   CO2 26  --   --  28  --  28  --   GLUCOSE 98  --   --  95  --  92  --  BUN 8  --   --  10  --  9  --   CREATININE 0.78  < > 0.78 0.68 0.63 0.63 0.65  CALCIUM 8.0*  --   --  8.5*  --  8.9  --   < > = values in this interval not displayed. GFR: Estimated  Creatinine Clearance: 88.2 mL/min (by C-G formula based on SCr of 0.65 mg/dL). Liver Function Tests: No results for input(s): AST, ALT, ALKPHOS, BILITOT, PROT, ALBUMIN in the last 168 hours. No results for input(s): LIPASE, AMYLASE in the last 168 hours. No results for input(s): AMMONIA in the last 168 hours. Coagulation Profile: No results for input(s): INR, PROTIME in the last 168 hours. Cardiac Enzymes: No results for input(s): CKTOTAL, CKMB, CKMBINDEX, TROPONINI in the last 168 hours. BNP (last 3 results) No results for input(s): PROBNP in the last 8760 hours. HbA1C: No results for input(s): HGBA1C in the last 72 hours. CBG: No results for input(s): GLUCAP in the last 168 hours. Lipid Profile: No results for input(s): CHOL, HDL, LDLCALC, TRIG, CHOLHDL, LDLDIRECT in the last 72 hours. Thyroid Function Tests: No results for input(s): TSH, T4TOTAL, FREET4, T3FREE, THYROIDAB in the last 72 hours. Anemia Panel: No results for input(s): VITAMINB12, FOLATE, FERRITIN, TIBC, IRON, RETICCTPCT in the last 72 hours. Urine analysis:    Component Value Date/Time   COLORURINE YELLOW 11/10/2016 2132   APPEARANCEUR CLEAR 11/10/2016 2132   LABSPEC 1.006 11/10/2016 2132   PHURINE 5.0 11/10/2016 2132   GLUCOSEU NEGATIVE 11/10/2016 2132   HGBUR MODERATE (A) 11/10/2016 2132   BILIRUBINUR NEGATIVE 11/10/2016 2132   KETONESUR NEGATIVE 11/10/2016 2132   PROTEINUR NEGATIVE 11/10/2016 2132   UROBILINOGEN 0.2 08/05/2010 1821   NITRITE NEGATIVE 11/10/2016 2132   LEUKOCYTESUR SMALL (A) 11/10/2016 2132     Iraida Cragin M.D. [facility] 12/04/2016, 11:41 AM  Pager: [phone] Between 7am to 7pm - call Pager - [phone]  After 7pm go to [email or web address] - password [email or web address]  Call night coverage person covering after 7pm            [facility]                                                                              Patient Demographics  Savannah Palmer, is a 26 y.o. female,  DOB - 02-26-1991, YNW:[id number]  Admit date - 11/10/2016   Admitting Physician Coralyn Helling, MD  Outpatient Primary MD for the patient is Patient, No Pcp Per  Outpatient specialists:   LOS - 24  days   Medical records reviewed and are as summarized below:    No chief complaint on file.      Brief summary    26 year old Caucasian female with a past medical history of anxiety, depression, heroin abuse, presented with weakness, cough and chest pain. Initially was in septic shock and required pressors. Blood cultures grew MRSA. 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 She was placed on IV antibiotics. She also developed a spontaneous right-sided pneumothorax. Chest tube was placed by critical care medicine. She was transferred to [facility] for opinion from cardiothoracic surgery. Patient is not  a candidate for surgery at this time. PTX resolved,  chest tube removed 6/7 Remains stable on IV Vancomycin until 7/15  Assessment & Plan    MRSA bacteremia/Septic shock with right TV endocarditis and septic emboli to lungs/MRSA empyema -Admitted with septic shock to ICU requiring vasopressors -Sepsis resolved -Blood cultures 5/27 grew MRSA and found to have septic pulmonary emboli -Repeat cultures subsequently positive but have been negative since 6/4  -2-D echo on 5/27 with tricuspid valve vegetations, moderate TR, TEE on 6/1 showed large tricuspid valve vegetation with severe TR and dilated RV  - Due to persistent bacteremia patient was started on daptomycin 6/2 and Ceftaroline continued to cover lung infection. - cardiothoracic surgery was also consulted, not a candidate for surgery at this time.  - Per ID recommendations, stopped Ceftaroline and daptomycin, use vancomycin alone, duration through July 15, will need BMP, CBC weekly, trough goal of 15-20.  -ID follow-up to be arranged -she has been on suboxone  intermittently for 2years, very reluctant to come off this now since it helps her significantly with cravings/risk of heroin relapse if she stops this  Acute respiratory failure/spontaneous right pneumothorax/MRSA empyema -Followed by CVTS, status post chest tube drainage, resolved -Chest tube removed 6/7 -Stable now and repeat chest x-rays clear  Acute kidney injury -Secondary to NSAIDs and dehydration -Resolved  Ascites, anasarca due to right sided hear failure - as a result of severe TR, net positive balance of 13.7 L - limit IVF, hold off on administering diuretics in setting of sepsis and borderline BPs, O2 sats 96% on room air - resume Po Lasix 20 mg daily  Splenomegaly - etiology undetermined- CT on 5/30 revealed that the spleen is enlarged measuring 15.7 x 5.6 x 17 cm (volume= 780 cm^3) - liver is unremarkable on CT - Needs follow-up   Normocytic Anemia - H&H currently stable, was transfused on 6/5, no overt bleeding noted - Iron was 12. Ferritin 213. B-12 1850. Folate 7.8.  Hypokalemia/Hypophosphatemia/Hypomagnesemia - Resolved  Thrombocytopenia - Resolved  Heroin abuse - cont Suboxone, reluctant to come off this and risk of heroin relapse  Anxiety - cont Buspar, Neurontin, Xanax  Nicotine dependence -Continue  Nicoderm patch  Code Status: Full CODE STATUS  DVT Prophylaxis:  heparin  Family Communication: Mother at bedside Disposition Plan: will likely stay until July 15 to complete Abx course  Time Spent in minutes  25 minutes  Procedures:  TEE  - Left ventricle: The cavity size was normal. Wall thickness was normal. Systolic function was normal. The estimated ejection fraction was in the range of 55% to 60%. Wall motion was normal; there were no regional wall motion abnormalities. - Left atrium: No evidence of thrombus in the atrial cavity or appendage. - Right ventricle: The cavity size was mildly dilated. Wall thickness was  normal. - Right atrium: No evidence of thrombus in the atrial cavity or appendage. - Tricuspid valve: Flail motion of the posterior leaflet. There is a 8x5 mm vegetation attached to the posterior leaflet, but most of the &quot;mass&quot; described on transthoracic imaging appears to be the flail segment of the valve. There was severe regurgitation directed eccentrically and toward the free wall.  Right chest tube placement for pneumothorax 6/1  Placement of a larger bore chest tube in the right chest 6/3  Consultants:   Infectious disease Cardiothoracic surgery  Antimicrobials:   Daptomycin 6/2> 6/8  Teflaro 5/31> 6/8  Vancomycin 6/8>> will need to July 15   Medications  Scheduled Meds: .  buprenorphine-naloxone  2 tablet Sublingual Daily  . busPIRone  10 mg Oral BID  . furosemide  20 mg Oral Daily  . gabapentin  300 mg Oral TID  . heparin  5,000 Units Subcutaneous Q8H  . nicotine  14 mg Transdermal Daily  . potassium chloride  40 mEq Oral Daily  . saccharomyces boulardii  250 mg Oral BID   Continuous Infusions: . sodium chloride 250 mL (11/28/16 2032)  . vancomycin Stopped (12/04/16 1029)   PRN Meds:.sodium chloride, acetaminophen, albuterol, ALPRAZolam, docusate sodium, ibuprofen, nicotine polacrilex, ondansetron (ZOFRAN) IV, oxyCODONE, sodium chloride flush   Antibiotics   Anti-infectives    Start     Dose/Rate Route Frequency Ordered Stop   11/29/16 1000  vancomycin (VANCOCIN) IVPB 750 mg/150 ml premix     750 mg 150 mL/hr over 60 Minutes Intravenous Every 12 hours 11/29/16 0934     11/26/16 2000  vancomycin (VANCOCIN) IVPB 1000 mg/200 mL premix  Status:  Discontinued     1,000 mg 200 mL/hr over 60 Minutes Intravenous Every 12 hours 11/26/16 1912 11/29/16 0853   11/24/16 0000  vancomycin (VANCOCIN) IVPB 1000 mg/200 mL premix  Status:  Discontinued     1,000 mg 200 mL/hr over 60 Minutes Intravenous Every 8 hours 11/23/16 1238 11/26/16 0721    11/23/16 1600  vancomycin (VANCOCIN) 1,500 mg in sodium chloride 0.9 % 500 mL IVPB     1,500 mg 250 mL/hr over 120 Minutes Intravenous  Once 11/23/16 1238 11/23/16 1733   11/17/16 1600  DAPTOmycin (CUBICIN) 500 mg in sodium chloride 0.9 % IVPB  Status:  Discontinued     500 mg 220 mL/hr over 30 Minutes Intravenous Every 24 hours 11/17/16 1432 11/23/16 1238   11/17/16 1500  ceftaroline (TEFLARO) 600 mg in sodium chloride 0.9 % 250 mL IVPB  Status:  Discontinued     600 mg 250 mL/hr over 60 Minutes Intravenous Every 12 hours 11/17/16 1435 11/23/16 1238   11/15/16 1800  ceftaroline (TEFLARO) 600 mg in sodium chloride 0.9 % 250 mL IVPB  Status:  Discontinued     600 mg 250 mL/hr over 60 Minutes Intravenous Every 12 hours 11/15/16 1635 11/17/16 1431   11/13/16 1215  vancomycin (VANCOCIN) IVPB 1000 mg/200 mL premix  Status:  Discontinued     1,000 mg 200 mL/hr over 60 Minutes Intravenous Every 8 hours 11/13/16 1203 11/16/16 0850   11/11/16 1100  vancomycin (VANCOCIN) IVPB 750 mg/150 ml premix  Status:  Discontinued     750 mg 150 mL/hr over 60 Minutes Intravenous Every 8 hours 11/11/16 0956 11/13/16 1203   11/11/16 0400  vancomycin (VANCOCIN) 500 mg in sodium chloride 0.9 % 100 mL IVPB  Status:  Discontinued     500 mg 100 mL/hr over 60 Minutes Intravenous Every 12 hours 11/10/16 1951 11/11/16 0956   11/10/16 2200  ceFEPIme (MAXIPIME) 1 g in dextrose 5 % 50 mL IVPB  Status:  Discontinued     1 g 100 mL/hr over 30 Minutes Intravenous Every 12 hours 11/10/16 1951 11/11/16 0926   11/10/16 2000  ceFEPIme (MAXIPIME) 2 g in dextrose 5 % 50 mL IVPB  Status:  Discontinued     2 g 100 mL/hr over 30 Minutes Intravenous  Once 11/10/16 1952 11/10/16 1954   11/10/16 2000  vancomycin (VANCOCIN) IVPB 1000 mg/200 mL premix  Status:  Discontinued     1,000 mg 200 mL/hr over 60 Minutes Intravenous  Once 11/10/16 1952 11/10/16 1954   11/10/16 1430  piperacillin-tazobactam (ZOSYN) IVPB 3.375 g     3.375 g 100  mL/hr over 30 Minutes Intravenous  Once 11/10/16 1426 11/10/16 1503   11/10/16 1430  vancomycin (VANCOCIN) IVPB 1000 mg/200 mL premix     1,000 mg 200 mL/hr over 60 Minutes Intravenous  Once 11/10/16 1426 11/10/16 1653        Subjective:  -no complaints, swelling improving  Objective:   Vitals:   12/03/16 0703 12/03/16 1308 12/03/16 2133 12/04/16 0555  BP:  126/89 (!) 138/94 127/84  Pulse:  71 (!) 59 63  Resp:  18 20 16   Temp:  98.5 F (36.9 C) 98.5 F (36.9 C) 98 F (36.7 C)  TempSrc:      SpO2:  96% 98% 98%  Weight: 62.1 kg (136 lb 12.8 oz)     Height:        Intake/Output Summary (Last 24 hours) at 12/04/16 1141 Last data filed at 12/03/16 2225  Gross per 24 hour  Intake              150 ml  Output                0 ml  Net              150 ml     Wt Readings from Last 3 Encounters:  12/03/16 62.1 kg (136 lb 12.8 oz)  03/14/16 58.5 kg (129 lb)  01/19/16 58.1 kg (128 lb)     Exam  Gen: Awake, Alert, Oriented X 3,  HEENT: PERRLA, Neck supple, no JVD Lungs: Good air movement bilaterally, CTAB CVS: RRR, grade 2 systolic murmur Abd: soft, Non tender, non distended, BS present Extremities: No Cyanosis, Clubbing or edema Skin: no new rashes   Data Reviewed:  I have personally reviewed following labs and imaging studies  Micro Results No results found for this or any previous visit (from the past 240 hour(s)).  Radiology Reports Dg Chest 2 View  Result Date: 11/23/2016 CLINICAL DATA:  Chest tube removal EXAM: CHEST  2 VIEW COMPARISON:  11/21/2016 FINDINGS: Interval removal of right chest tube with residual small right pleural effusion versus pleural thickening. No pneumothorax. Patchy bilateral airspace opacities are again noted, unchanged. Heart is borderline in size. IMPRESSION: Interval removal of right chest tube without pneumothorax. Otherwise no change. Electronically Signed   By: Charlett Nose M.D.   On: 11/23/2016 08:44   Dg Chest 2 View  Result  Date: 11/21/2016 CLINICAL DATA:  Chest soreness.  Chest tube . EXAM: CHEST  2 VIEW COMPARISON:  CT 11/20/2016.  Chest x-ray 11/20/2016 FINDINGS: Chest tube noted on the right. Tiny right anterior hydropneumothorax again noted. No interim change . Stable cardiomegaly. Persistent bilateral pulmonary nodules including cavitary nodules. Reference is made to prior CT report. Small right pleural effusion. No pneumothorax. IMPRESSION: 1. Right chest tube in stable position. Tiny right anterior hydropneumothorax again noted. No interim change. 2. Multiple bilateral pulmonary nodules with cavitation. Reference is made to prior CT report of 11/20/2016 . Electronically Signed   By: Maisie Fus  Register   On: 11/21/2016 11:15   Dg Chest 2 View  Result Date: 11/19/2016 CLINICAL DATA:  Pneumothorax. EXAM: CHEST  2 VIEW COMPARISON:  11/19/2016. FINDINGS: Right chest tube in stable position. No significant pneumothorax noted on today's exam. Small right pleural effusion. Bilateral patchy pulmonary infiltrates with cavitation again noted. Small left pleural effusion noted. Heart size stable. No acute bony abnormality . IMPRESSION: 1. Right chest tube in stable  position. No pneumothorax noted on today's exam. Small right pleural effusion. 2. Persistent bilateral cavitary pulmonary infiltrates. Small left pleural effusion. Electronically Signed   By: Maisie Fus  Register   On: 11/19/2016 16:53   Dg Chest 2 View  Result Date: 11/10/2016 CLINICAL DATA:  Chest pain, shortness of breath for 1 week EXAM: CHEST  2 VIEW COMPARISON:  CT chest 03/12/2016, chest x-ray 03/12/2016 FINDINGS: There are bilateral nodular airspace opacities throughout the upper and lower lobes bilaterally. There is no pleural effusion or pneumothorax. The heart and mediastinal contours are unremarkable. The osseous structures are unremarkable. IMPRESSION: New bilateral nodular airspace opacities throughout the upper and lower lobes bilaterally. Findings are concerning  for multifocal infection such as septic emboli. Recommend further evaluation with a CT of the chest with intravenous contrast. Electronically Signed   By: Elige Ko   On: 11/10/2016 13:23   Ct Chest Wo Contrast  Result Date: 11/20/2016 CLINICAL DATA:  Follow-up right pleural effusion EXAM: CT CHEST WITHOUT CONTRAST TECHNIQUE: Multidetector CT imaging of the chest was performed following the standard protocol without IV contrast. COMPARISON:  Radiograph 11/20/2016, CT chest 11/10/2016, prior radiographs dating back to 11/10/2016 FINDINGS: Cardiovascular: Limited assessment without intravenous contrast. Small fluid in the superior pericardial recess. There is cardiomegaly. Mediastinum/Nodes: Difficult evaluation without contrast. Mediastinal adenopathy is present right paratracheal soft tissue fullness, possible adenopathy does not appear significantly changed. Midline trachea. Esophagus grossly unremarkable. Lungs/Pleura: Interim placement of a right lower lateral chest tube since the prior CT with the tip terminating along the right upper posterior pleural surface. Small bilateral pleural effusions right greater than left, slightly increased compared to the previous CT. Tiny right anterior hydropneumothorax. Multiple bilateral pulmonary nodules and cavitary lesions. Some of the cavitary lesions have decreased, however if there appears to be increased nodules/disease most notable in the left upper lobe and the right lower lobe. Decreased septal thickening compared to the previous exam. Upper Abdomen: Spleen appears enlarged. Partially visualized surgical clips in the gallbladder fossa. Musculoskeletal: No acute or suspicious bone lesion. IMPRESSION: 1. Interim placement of right-sided chest tube. Small residual pleural effusions right greater than left, mildly increased compared to the prior chest CT. Small right anterior hydropneumothorax. 2. Innumerable bilateral lung nodules and cavitary lesions, suspected  to represent septic emboli. This appears increased in the left upper and right lower lobe since the prior CT. 3. Suspect mediastinal adenopathy although evaluation of the mediastinum is limited without contrast 4. Splenomegaly Electronically Signed   By: Jasmine Pang M.D.   On: 11/20/2016 21:25   Ct Chest Wo Contrast  Result Date: 11/10/2016 CLINICAL DATA:  She c/o some chest discomfrot and shortness of breath x 1 week. She states these are similar s/s she had a year ago, at which time she was dx with "pulmonary abscess". She is in no distress. IV drug user EXAM: CT CHEST WITHOUT CONTRAST TECHNIQUE: Multidetector CT imaging of the chest was performed following the standard protocol without IV contrast. COMPARISON:  Chest CT angiogram dated 03/12/2016. FINDINGS: Cardiovascular: Probable small pericardial effusion and/or pericardial thickening, difficult to characterize without IV contrast. Heart size is within normal limits. Thoracic aorta appears to be normal in caliber. Mediastinum/Nodes: Soft tissue density material within the right upper peritracheal space, also difficult to characterize without IV contrast, presumed lymphadenopathy. Additional mild lymphadenopathy within the anterior mediastinum and aortopulmonary window region. Lungs/Pleura: Numerous cavitary consolidations throughout both lungs, peripherally distributed suggesting septic emboli. More confluent consolidations at each lung base, more likely atelectasis than  pneumonia. Small right pleural effusion. Upper Abdomen: Suspected splenomegaly, incompletely imaged. Musculoskeletal: No acute or suspicious osseous finding. IMPRESSION: 1. Numerous cavitary consolidations throughout both lungs, with a peripheral distribution suggesting septic emboli. Largest cavitary consolidation is within the left lower lobe measuring approximately 4 cm greatest dimension. 2. Probable small pericardial effusion and/or pericardial wall thickening, difficult to  delineate without IV contrast. 3. Probable mediastinal lymphadenopathy, again difficult to definitively characterize without IV contrast, alternatively confluent fluid or edema within the mediastinum. 4. Small right pleural effusion. 5. Probable splenomegaly, incompletely imaged at the lower aspects of this chest CT. 6. Right IJ line in place with tip passing through the right atrium and into the right ventricle. Recommend retracting to the cavoatrial junction for optimal radiographic positioning. These results and recommendations were called by telephone at the time of interpretation on 11/10/2016 at 7:35 pm to Dr. Katrinka Blazing , who verbally acknowledged these results. Electronically Signed   By: Bary Richard M.D.   On: 11/10/2016 19:36   Ct Abdomen Pelvis W Contrast  Result Date: 11/14/2016 CLINICAL DATA:  Right upper quadrant pain times 2-3 days. Polysubstance abuse. Cholecystectomy. History of endocarditis and septic emboli. EXAM: CT ABDOMEN AND PELVIS WITH CONTRAST TECHNIQUE: Multidetector CT imaging of the abdomen and pelvis was performed using the standard protocol following bolus administration of intravenous contrast. CONTRAST:  80mL ISOVUE-300 IOPAMIDOL (ISOVUE-300) INJECTION 61% COMPARISON:  03/12/2016 chest CT FINDINGS: Lower chest: Small right effusion with several bilateral cavitary opacities in both lower lobes concerning for septic emboli or cavitary pneumonia. More confluent airspace opacity in the left lower lobe is also noted. The visualized heart is normal in size. There is no pericardial effusion. No large central pulmonary embolus. Hepatobiliary: Normal appearance of the liver without space-occupying mass or biliary dilatation. Cholecystectomy. Pancreas: No pancreatic mass or ductal dilatation. Mild enlargement of the pancreatic head with surrounding peripancreatic fluid is noted. Correlate to exclude a pancreatitis. Spleen: The spleen is enlarged measuring 15.7 x 5.6 x 17 cm (volume = 780 cm^3)  and demonstrates a developmental cleft, partially visualized on prior chest CT and therefore believed less likely to represent a laceration. Adrenals/Urinary Tract: Adrenal glands are unremarkable. Kidneys are normal, without renal calculi, solid appearing lesions, or hydronephrosis. There appear to be pair of water attenuating cysts in the lower pole the right kidney measuring up to 2 x 1.3 cm in aggregate. Bladder is unremarkable. Stomach/Bowel: Stomach is within normal limits. Appendix appears normal. No evidence of bowel wall thickening, distention, or inflammatory changes. Vascular/Lymphatic: No significant vascular findings are present. No enlarged abdominal or pelvic lymph nodes. Reproductive: Uterus and bilateral adnexa are unremarkable. Other: Moderate volume of ascites.  Anasarca. Musculoskeletal: No acute or significant osseous findings. IMPRESSION: 1. Bibasilar, multifocal cavitary lesions noted within the visualized lower lobes consistent with septic emboli or possibly cavitary pneumonia. More confluent airspace disease in the left lower lobe without cavitation is also present. There is a small right pleural effusion. 2. Anasarca with moderate volume of ascites noted within the abdomen and pelvis. Question right heart dysfunction/failure. 3. Splenomegaly. 4. Water attenuating cysts in the lower pole the right kidney in aggregate measuring 2 x 1.3 cm. 5. Status post cholecystectomy. Electronically Signed   By: Tollie Eth M.D.   On: 11/14/2016 16:26   Dg Chest 1v Repeat Same Day  Result Date: 11/18/2016 CLINICAL DATA:  Chest tube placement EXAM: CHEST - 1 VIEW SAME DAY COMPARISON:  Earlier same day FINDINGS: Large bore chest tube placed on the  right along the lateral margin. Marked reduction an amount of right pleural air, nearly completely evacuated. Patchy infiltrates persist in both lower lobes. IMPRESSION: Large bore chest tube placed. Near complete resolution of right pneumothorax. Electronically  Signed   By: Paulina Fusi M.D.   On: 11/18/2016 11:40   Dg Chest Port 1 View  Result Date: 11/20/2016 CLINICAL DATA:  26 year old female with pneumothorax. Recent transesophageal echocardiogram. Subsequent encounter. EXAM: PORTABLE CHEST 1 VIEW COMPARISON:  11/20/2016 8:30 a.m. FINDINGS: Right-sided chest tube remains in place. No pneumothorax detected on this frontal projection. Cavitary lesions bilaterally. Left base consolidation. Atelectasis/pleural thickening right lung base. Cardiomegaly. IMPRESSION: No significant change since exam earlier today. Electronically Signed   By: Lacy Duverney M.D.   On: 11/20/2016 13:55   Dg Chest Port 1 View  Result Date: 11/20/2016 CLINICAL DATA:  Chest tube, shortness of Breath EXAM: PORTABLE CHEST 1 VIEW COMPARISON:  11/19/2016 FINDINGS: Right chest tube remains in place, unchanged. No pneumothorax. Patchy bilateral airspace opacities are again noted, some with cavitation. Small right pleural effusion. Heart is borderline in size. IMPRESSION: Right chest tube remains in place without pneumothorax. Stable patchy bilateral airspace disease, some of which is cavitary. Electronically Signed   By: Charlett Nose M.D.   On: 11/20/2016 08:42   Dg Chest Port 1 View  Result Date: 11/19/2016 CLINICAL DATA:  26 year old female with cavitary lung lesions possibly from septic endocarditis. Subsequent encounter. EXAM: PORTABLE CHEST 1 VIEW COMPARISON:  11/18/2016. FINDINGS: Right-sided chest tube remains in place. Tiny hydropneumothorax versus fluid within minor fissure noted. Multiple cavitary lesions. Right-sided pleural effusion. Basilar atelectasis versus infiltrate greater on the left. Cardiomegaly. Mild scoliosis. IMPRESSION: Right-sided chest tube remains in place. Tiny hydropneumothorax versus fluid within minor fissure. Multiple cavitary lesions. Right-sided pleural effusion. Basilar atelectasis versus infiltrate greater on the left. Electronically Signed   By: Lacy Duverney  M.D.   On: 11/19/2016 07:16   Dg Chest Port 1 View  Result Date: 11/18/2016 CLINICAL DATA:  Followup left pneumothorax. Patient with bacterial endocarditis. EXAM: PORTABLE CHEST 1 VIEW COMPARISON:  11/17/2016 and prior studies FINDINGS: A moderate right hydropneumothorax is unchanged in size, but now with pleural fluid in its basilar portion. A right thoracostomy tube is unchanged. Scattered pulmonary opacities/ nodules and left lower lung consolidations/atelectasis again noted. There has been no other interval change. IMPRESSION: Moderate right hydropneumothorax, unchanged in size but now with pleural fluid in its basilar portion. Right thoracostomy tube remains unchanged. Electronically Signed   By: Harmon Pier M.D.   On: 11/18/2016 10:14   Dg Chest Port 1 View  Result Date: 11/17/2016 CLINICAL DATA:  Follow-up right-sided chest tube placement. Initial encounter. EXAM: PORTABLE CHEST 1 VIEW COMPARISON:  Chest radiograph performed 11/16/2016 FINDINGS: The patient's moderate right-sided pneumothorax has decreased in size. The right-sided chest tube is grossly unchanged in appearance. Vascular congestion is noted. Patchy left-sided airspace opacities raise concern for pneumonia. Right-sided airspace opacity may reflect atelectasis. A small left pleural effusion is noted. The cardiomediastinal silhouette is mildly enlarged. No acute osseous abnormalities are identified. IMPRESSION: 1. Moderate right-sided pneumothorax has decreased in size. Right-sided chest tube is grossly unchanged in appearance. 2. Vascular congestion and mild cardiomegaly. Patchy left-sided airspace opacities raise concern for pneumonia. Small left pleural effusion noted. 3. Right-sided airspace opacity may reflect atelectasis. Electronically Signed   By: Roanna Raider M.D.   On: 11/17/2016 00:33   Dg Chest Port 1 View  Result Date: 11/16/2016 CLINICAL DATA:  Chest tube placement  for right pneumothorax. EXAM: PORTABLE CHEST 1 VIEW  COMPARISON:  Single-view of the chest earlier today and 11/11/2016. FINDINGS: Pigtail catheter is now in place in the right chest. Large right pneumothorax is unchanged. Patchy airspace disease in cavitary lesions in the left chest persist. Heart size is normal. IMPRESSION: No change in large right pneumothorax after chest tube placement. No change in patchy airspace disease in cavitary lesions on the left. These results were called by telephone at the time of interpretation on 11/16/2016 at 8:10 pm to Estrella Deeds, RN, who verbally acknowledged these results. Electronically Signed   By: Drusilla Kanner M.D.   On: 11/16/2016 20:12   Dg Chest Port 1 View  Result Date: 11/16/2016 CLINICAL DATA:  Shortness of Breath EXAM: PORTABLE CHEST 1 VIEW COMPARISON:  11/11/2016 FINDINGS: Cardiac shadow is within normal limits. Cavitary nodular densities are noted throughout both lungs similar to that seen on previous CT examination. A new right-sided moderately large pneumothorax is noted with consolidation of the lower lobe on the right. No acute bony abnormality is noted. IMPRESSION: Moderately large right-sided pneumothorax new from the prior exam. Near complete collapse of the right lower lobe is noted. Stable cavitary lesions within both lungs. Critical Value/emergent results were called by telephone at the time of interpretation on 11/16/2016 at 6:24 pm to Dr Leitha Bleak, who verbally acknowledged these results. Electronically Signed   By: Alcide Clever M.D.   On: 11/16/2016 18:27   Dg Chest Port 1 View  Result Date: 11/11/2016 CLINICAL DATA:  Check right IJ line placement.  Initial encounter. EXAM: PORTABLE CHEST 1 VIEW COMPARISON:  Chest radiograph performed 11/10/2016 FINDINGS: The right IJ line is noted ending at the right atrium. This could be retracted 2-3 cm. Mild vascular congestion is noted. Mild left-sided opacities may reflect atelectasis or mild pneumonia. No pleural effusion or pneumothorax is seen. The  cardiomediastinal silhouette is borderline normal in size. No acute osseous abnormalities are identified. IMPRESSION: 1. Right IJ line noted ending at the right atrium. This could be retracted 2-3 cm, as deemed clinically appropriate. 2. Mild vascular congestion noted. 3. Mild left-sided airspace opacities may reflect atelectasis or mild pneumonia. Electronically Signed   By: Roanna Raider M.D.   On: 11/11/2016 00:37   Dg Chest Port 1 View  Result Date: 11/10/2016 CLINICAL DATA:  Retraction of the IJ line about 10 cm EXAM: PORTABLE CHEST 1 VIEW COMPARISON:  CT chest 11/10/2016.  Chest 11/10/2016. FINDINGS: The left central venous catheter tip localizes to the mid/distal right atrial region. If placement at or above the cavoatrial junction is desired, retraction of about 3-4 cm is recommended. No pneumothorax. Shallow inspiration with atelectasis in the lung bases. Borderline heart size. Patchy airspace infiltrates in both lungs could indicate edema, aspiration, or pneumonia. No blunting of costophrenic angles. IMPRESSION: Central venous catheter tip is in the mid/ low right atrial region. If placement at or above the cavoatrial junction is desired, retraction of about 3-4 cm is recommended. Electronically Signed   By: Burman Nieves M.D.   On: 11/10/2016 21:35   Dg Chest Portable 1 View  Result Date: 11/10/2016 CLINICAL DATA:  Status post central line placement. EXAM: PORTABLE CHEST 1 VIEW COMPARISON:  Chest x-ray from earlier same day. FINDINGS: Interval placement of a right IJ central line with tip passing to the left of midline and likely in the right ventricle or main pulmonary artery. The bilateral small nodular airspace opacities throughout both lungs are unchanged in the short-term interval.  Suspect small bilateral pleural effusions. No pneumothorax seen. IMPRESSION: 1. Right IJ central line placement with tip positioned to the left of midline either within the right ventricle or main pulmonary  artery. Recommend retracting at least 10 cm. 2. Bilateral small nodular airspace opacities throughout both lungs are unchanged in the short-term interval. As recommended on earlier chest x-ray report, would consider chest CT for further characterization. 3. Suspect small bilateral pleural effusions. Electronically Signed   By: Bary Richard M.D.   On: 11/10/2016 18:37    Lab Data:  CBC:  Recent Labs Lab 11/28/16 0405 11/29/16 0409 11/30/16 0440  WBC 6.8 6.6 7.6  HGB 9.7* 9.4* 9.9*  HCT 30.4* 29.4* 31.2*  MCV 86.6 87.5 87.6  PLT 329 317 327   Basic Metabolic Panel:  Recent Labs Lab 11/28/16 0405  11/30/16 0440 12/01/16 0349 12/02/16 0432 12/03/16 0428 12/04/16 0446  NA 138  --   --  138  --  138  --   K 3.5  --   --  3.4*  --  3.5  --   CL 105  --   --  104  --  103  --   CO2 26  --   --  28  --  28  --   GLUCOSE 98  --   --  95  --  92  --   BUN 8  --   --  10  --  9  --   CREATININE 0.78  < > 0.78 0.68 0.63 0.63 0.65  CALCIUM 8.0*  --   --  8.5*  --  8.9  --   < > = values in this interval not displayed. GFR: Estimated Creatinine Clearance: 88.2 mL/min (by C-G formula based on SCr of 0.65 mg/dL). Liver Function Tests: No results for input(s): AST, ALT, ALKPHOS, BILITOT, PROT, ALBUMIN in the last 168 hours. No results for input(s): LIPASE, AMYLASE in the last 168 hours. No results for input(s): AMMONIA in the last 168 hours. Coagulation Profile: No results for input(s): INR, PROTIME in the last 168 hours. Cardiac Enzymes: No results for input(s): CKTOTAL, CKMB, CKMBINDEX, TROPONINI in the last 168 hours. BNP (last 3 results) No results for input(s): PROBNP in the last 8760 hours. HbA1C: No results for input(s): HGBA1C in the last 72 hours. CBG: No results for input(s): GLUCAP in the last 168 hours. Lipid Profile: No results for input(s): CHOL, HDL, LDLCALC, TRIG, CHOLHDL, LDLDIRECT in the last 72 hours. Thyroid Function Tests: No results for input(s): TSH,  T4TOTAL, FREET4, T3FREE, THYROIDAB in the last 72 hours. Anemia Panel: No results for input(s): VITAMINB12, FOLATE, FERRITIN, TIBC, IRON, RETICCTPCT in the last 72 hours. Urine analysis:    Component Value Date/Time   COLORURINE YELLOW 11/10/2016 2132   APPEARANCEUR CLEAR 11/10/2016 2132   LABSPEC 1.006 11/10/2016 2132   PHURINE 5.0 11/10/2016 2132   GLUCOSEU NEGATIVE 11/10/2016 2132   HGBUR MODERATE (A) 11/10/2016 2132   BILIRUBINUR NEGATIVE 11/10/2016 2132   KETONESUR NEGATIVE 11/10/2016 2132   PROTEINUR NEGATIVE 11/10/2016 2132   UROBILINOGEN 0.2 08/05/2010 1821   NITRITE NEGATIVE 11/10/2016 2132   LEUKOCYTESUR SMALL (A) 11/10/2016 2132     Tristian Sickinger M.D. [facility] 12/04/2016, 11:41 AM  Contact via [email or web address], password [email or web address] Call night coverage person covering after 7pm

## 2016-12-04 NOTE — Progress Notes (Signed)
Pharmacy Antibiotic Note Savannah Palmer is a 26 y.o. 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 Pharmacy has been consulted for Vancomycin dosing.  Patient remains stable on vancomycin with stable renal function. Last vancomycin trough on 6/16 was therapeutic at 17ug/mL.  Plan: 1. Continue Vancomycin to 750mg  IV q12 2. Repeat vancomycin trough as needed (at least two times weekly while on therapy) or earlier if clinical course dictates  3. Ok for BMP every 48 - 72 hours from a pharmacy monitoring perspective  4. Plans for 6 weeks of treatment- through 12/30/2016 5. Follow dispo planning   Height: 5\' 3"  (160 cm) Weight: 136 lb 12.8 oz (62.1 kg) IBW/kg (Calculated) : 52.4  Temp (24hrs), Avg:98.3 F (36.8 C), Min:98 F (36.7 C), Max:98.5 F (36.9 C)   Recent Labs Lab 11/28/16 0405 11/29/16 0409 11/29/16 0743 11/30/16 0440 12/01/16 0349 12/01/16 0859 12/02/16 0432 12/03/16 0428 12/04/16 0446  WBC 6.8 6.6  --  7.6  --   --   --   --   --   CREATININE 0.78 0.71  --  0.78 0.68  --  0.63 0.63 0.65  VANCOTROUGH  --   --  23*  --   --  17  --   --   --     Estimated Creatinine Clearance: 88.2 mL/min (by C-G formula based on SCr of 0.65 mg/dL).    No Known Allergies  5/26 Zosyn x 1 5/26 Vanc >> 6/1;   6/8 >> 5/26 Cefepime >> 5/27 5/31 Teflaro >> 6/8 6/2 Daptomycin >> 6/8   5/27: increase vanc to 750 q8 with improved renal function 5/29 1049 VT: 13 (subtherapeutic) on 750mg  q8h prior to 7th dose, inc to 1g q8h 5/31 2030 VT: 19 (therapeutic) - continue 1g q8h 6/11 0600 VT: 38 (accumulated on q8h dose)  VANC HOLD 6/11 VR 19 (t1/2 ~11.5 > reduced to 1000mg  q12) 6/14 VT 23 on 1g q12h > reduced to 750mg  q12h 6/16 VT 17 on 750mg  q12h > no changes  5/26 UCx: NGF 5/26 BCx: 4/4 MRSA 5/26 MRSA PCR: positive  5/27 BCx: 2/2 MRSA  5/28 BCx: 2/2 MRSA 5/28 HIV antibody: non reactive 5/28 HCV RNA: ND 5/29 BCx: MRSA,  BCID = MRSA 5/30 @ 0033 BCx: 1/2 MRSA 5/30 @ 1610 BCx: ngF 6/1 pleural fluid Cx: MRSA 6/4 BCx: ngF    Thank you for allowing pharmacy to be a part of this patient's care.  Pollyann SamplesAndy Aldona Palmer, PharmD, BCPS 12/04/2016, 12:50 PM Clinical Phone for 12/04/2016 until 3:30pm: [phone] If after 3:30pm, please call main pharmacy at [phone] 12/04/2016 12:47 PM

## 2016-12-04 NOTE — Progress Notes (Signed)
Clinical Social Worker still following patient/ family for support and any discharge needs. CSW assisting with SNF placement  Marrianne MoodAshley Sherwood Castilla, MSW,  [place]erson[place]CSWA [phone]

## 2016-12-05 LAB — CREATININE, SERUM
CREATININE: 0.61 mg/dL (ref 0.44–1.00)
GFR calc Af Amer: >60 mL/min (ref >60)

## 2016-12-05 MED ORDER — BUSPIRONE HCL 10 MG PO TABS
10.0000 mg | ORAL_TABLET | Freq: Three times a day (TID) | ORAL | Status: DC
  Administered 2016-12-05 – 2016-12-30 (×76): 10 mg via ORAL
  Filled 2016-12-05: qty 2
  Filled 2016-12-05 (×5): qty 1
  Filled 2016-12-05 (×3): qty 2
  Filled 2016-12-05: qty 1
  Filled 2016-12-05: qty 2
  Filled 2016-12-05: qty 1
  Filled 2016-12-05 (×3): qty 2
  Filled 2016-12-05 (×3): qty 1
  Filled 2016-12-05: qty 2
  Filled 2016-12-05 (×2): qty 1
  Filled 2016-12-05: qty 2
  Filled 2016-12-05: qty 1
  Filled 2016-12-05 (×2): qty 2
  Filled 2016-12-05 (×2): qty 1
  Filled 2016-12-05 (×2): qty 2
  Filled 2016-12-05: qty 1
  Filled 2016-12-05 (×2): qty 2
  Filled 2016-12-05 (×2): qty 1
  Filled 2016-12-05: qty 2
  Filled 2016-12-05: qty 1
  Filled 2016-12-05: qty 2
  Filled 2016-12-05 (×2): qty 1
  Filled 2016-12-05 (×2): qty 2
  Filled 2016-12-05 (×2): qty 1
  Filled 2016-12-05: qty 2
  Filled 2016-12-05: qty 1
  Filled 2016-12-05 (×3): qty 2
  Filled 2016-12-05: qty 1
  Filled 2016-12-05: qty 2
  Filled 2016-12-05 (×5): qty 1
  Filled 2016-12-05: qty 2
  Filled 2016-12-05 (×2): qty 1
  Filled 2016-12-05 (×2): qty 2
  Filled 2016-12-05: qty 1
  Filled 2016-12-05 (×2): qty 2
  Filled 2016-12-05: qty 1
  Filled 2016-12-05 (×2): qty 2
  Filled 2016-12-05: qty 1
  Filled 2016-12-05: qty 2
  Filled 2016-12-05: qty 1
  Filled 2016-12-05 (×3): qty 2
  Filled 2016-12-05: qty 1
  Filled 2016-12-05: qty 2

## 2016-12-05 MED ORDER — GABAPENTIN 400 MG PO CAPS
400.0000 mg | ORAL_CAPSULE | Freq: Four times a day (QID) | ORAL | Status: DC
  Administered 2016-12-05 – 2016-12-30 (×98): 400 mg via ORAL
  Filled 2016-12-05 (×98): qty 1

## 2016-12-05 MED ORDER — GABAPENTIN 400 MG PO CAPS
400.0000 mg | ORAL_CAPSULE | Freq: Three times a day (TID) | ORAL | Status: DC
  Administered 2016-12-05: 400 mg via ORAL
  Filled 2016-12-05: qty 1

## 2016-12-05 MED ORDER — GABAPENTIN 600 MG PO TABS: 600.0000 mg | ORAL_TABLET | Freq: Three times a day (TID) | ORAL | Status: DC

## 2016-12-05 NOTE — Progress Notes (Signed)
[facility]                                                                              Patient Demographics  Savannah Palmer, is a 26 y.o. female, DOB - 01-07-1991, ZOX:[id number]  Admit date - 11/10/2016   Admitting Physician Coralyn Helling, MD  Outpatient Primary MD for the patient is Patient, No Pcp Per  Outpatient specialists:   LOS - 25  days   Medical records reviewed and are as summarized below:    No chief complaint on file.      Brief summary    26 year old Caucasian female with a past medical history of anxiety, depression, heroin abuse, presented with weakness, cough and chest pain. Initially was in septic shock and required pressors. Blood cultures grew MRSA. 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 She was placed on IV antibiotics. She also developed a spontaneous right-sided pneumothorax. Chest tube was placed by critical care medicine. She was transferred to [facility] for opinion from cardiothoracic surgery. Patient is not a candidate for surgery at this time. PTX, resolved, remains stable,  inpatient on IV Vanc and Po suboxone till 7/15   Assessment & Plan    Principal Problem: MRSA bacteremia/Septic shock with right TV endocarditis and septic emboli to lungs/MRSA empyema - Patient presented with weakness, coughing, chest pain and septic shock requiring vasopressors. Blood cultures 5/27 grew out MRSA and was found to have septic emboli throughout her lungs.  -Repeat cultures were still positive, blood cultures repeat on 6/4 has been negative to date  - 2-D echo on 5/27 showed tricuspid vegetations with moderate TR. TEE on 6/1 showed large tricuspid vegetation, severe TR with dilated RV along with a loculated left effusion - HIV, Hep C negative. Due to family request, RPR was done and it was negative. - Due to persistent bacteremia patient was started on daptomycin  6/2 and Ceftaroline was continued to cover lung infection. Per ID recommendations, Ceftaroline and daptomycin have been now discontinued. - cardiothoracic surgery was also consulted and patient was deemed not a candidate for surgery at this time.  - on vancomycin duration through July 15, will need BMP, CBC weekly, trough goal of 15-20.  - PICC line placed on 6/9, continue vancomycin, until July 15 -remains stable   Right spontaneous pneumothorax- acute respiratory failure/MRSA Empyema -Right empyema with pneumothorax, positive for MRSA. Status post chest tube. - Chest tube removed on 6/7, chest x-ray clear  -resolved  Acute kidney injury -Baseline creatinine 0.7, creatinine increased to 1.37, likely secondary to dehydration and NSAIDs. - patient received IV fluids, NSAIDs were discontinued, and creatinine function has normalized.   RUQ pain, likely musculoskeletal -CT abdomen and pelvis showed no liver or gallbladder abnormalities, possible musculoskeletal origin, currently stable.    Ascites, anasarca due to right sided hear failure - as a result of severe TR, net positive balance of 12L - O2 sats 96% on room air -on low dose lasix for edema  Splenomegaly - etiology undetermined- CT on 5/30 revealed that the spleen is enlarged measuring 15.7 x  5.6 x 17 cm (volume= 780 cm^3) - liver is unremarkable on CT  Normocytic Anemia - H&H currently stable, was transfused on 6/5, no overt bleeding noted - Iron was 12. Ferritin 213. B-12 1850. Folate 7.8. - Hemoglobin down to 7.1 on 6/10, patient was transfused 2 units packed RBC with 1 dose of IV Feraheme, currently stable  Hypokalemia/Hypophosphatemia/Hypomagnesemia - Potassium 3.3, replaced  Thrombocytopenia - Resolved  Heroin abuse - cont Suboxone, will need transition to a drug rehab program once stable.  Anxiety - Celexa discontinued per patient's request.  - Buspar restarted per patient's request.  - continue  Neurontin.  Outpatient clinic recommends not to use benzodiazepine along with Suboxone. At present I would discontinue Suboxone and supplemented with increasing the dose of the BuSpar as well as increasing the dose of the Neurontin. Patient's doses not significantly high enough to cause a withdrawal seizure but we will monitor her closely. Patient refused to take any medication in SSRI TCA or  SNRI group.  Nicotine dependence -Continue  Nicoderm patch  Code Status: Full CODE STATUS  DVT Prophylaxis:  heparin  Family Communication: none at bedside Disposition Plan: Disposition difficulty due to patient being on suboxone and history of IVDA  Time Spent in minutes  45 minutes  Procedures:  TEE  - Left ventricle: The cavity size was normal. Wall thickness was normal. Systolic function was normal. The estimated ejection fraction was in the range of 55% to 60%. Wall motion was normal; there were no regional wall motion abnormalities. - Left atrium: No evidence of thrombus in the atrial cavity or appendage. - Right ventricle: The cavity size was mildly dilated. Wall thickness was normal. - Right atrium: No evidence of thrombus in the atrial cavity or appendage. - Tricuspid valve: Flail motion of the posterior leaflet. There is a 8x5 mm vegetation attached to the posterior leaflet, but most of the &quot;mass&quot; described on transthoracic imaging appears to be the flail segment of the valve. There was severe regurgitation directed eccentrically and toward the free wall.  Right chest tube placement for pneumothorax 6/1  Placement of a larger bore chest tube in the right chest 6/3  PICC line placed on 6/9  Consultants:   Infectious disease Cardiothoracic surgery  Antimicrobials:   Daptomycin 6/2> 6/8  Teflaro 5/31> 6/8  Vancomycin 6/8>> will need till July 15   Medications  Scheduled Meds: . buprenorphine-naloxone  2 tablet Sublingual Daily  .  busPIRone  10 mg Oral TID  . gabapentin  400 mg Oral QID  . heparin  5,000 Units Subcutaneous Q8H  . nicotine  14 mg Transdermal Daily  . potassium chloride  40 mEq Oral Daily  . saccharomyces boulardii  250 mg Oral BID   Continuous Infusions: . sodium chloride 250 mL (11/28/16 2032)  . vancomycin Stopped (12/05/16 1008)   PRN Meds:.sodium chloride, acetaminophen, albuterol, docusate sodium, ibuprofen, nicotine polacrilex, ondansetron (ZOFRAN) IV, oxyCODONE, sodium chloride flush   Antibiotics   Anti-infectives    Start     Dose/Rate Route Frequency Ordered Stop   11/29/16 1000  vancomycin (VANCOCIN) IVPB 750 mg/150 ml premix     750 mg 150 mL/hr over 60 Minutes Intravenous Every 12 hours 11/29/16 0934     11/26/16 2000  vancomycin (VANCOCIN) IVPB 1000 mg/200 mL premix  Status:  Discontinued     1,000 mg 200 mL/hr over 60 Minutes Intravenous Every 12 hours 11/26/16 1912 11/29/16 0853   11/24/16 0000  vancomycin (VANCOCIN) IVPB 1000 mg/200 mL premix  Status:  Discontinued     1,000 mg 200 mL/hr over 60 Minutes Intravenous Every 8 hours 11/23/16 1238 11/26/16 0721   11/23/16 1600  vancomycin (VANCOCIN) 1,500 mg in sodium chloride 0.9 % 500 mL IVPB     1,500 mg 250 mL/hr over 120 Minutes Intravenous  Once 11/23/16 1238 11/23/16 1733   11/17/16 1600  DAPTOmycin (CUBICIN) 500 mg in sodium chloride 0.9 % IVPB  Status:  Discontinued     500 mg 220 mL/hr over 30 Minutes Intravenous Every 24 hours 11/17/16 1432 11/23/16 1238   11/17/16 1500  ceftaroline (TEFLARO) 600 mg in sodium chloride 0.9 % 250 mL IVPB  Status:  Discontinued     600 mg 250 mL/hr over 60 Minutes Intravenous Every 12 hours 11/17/16 1435 11/23/16 1238   11/15/16 1800  ceftaroline (TEFLARO) 600 mg in sodium chloride 0.9 % 250 mL IVPB  Status:  Discontinued     600 mg 250 mL/hr over 60 Minutes Intravenous Every 12 hours 11/15/16 1635 11/17/16 1431   11/13/16 1215  vancomycin (VANCOCIN) IVPB 1000 mg/200 mL premix  Status:   Discontinued     1,000 mg 200 mL/hr over 60 Minutes Intravenous Every 8 hours 11/13/16 1203 11/16/16 0850   11/11/16 1100  vancomycin (VANCOCIN) IVPB 750 mg/150 ml premix  Status:  Discontinued     750 mg 150 mL/hr over 60 Minutes Intravenous Every 8 hours 11/11/16 0956 11/13/16 1203   11/11/16 0400  vancomycin (VANCOCIN) 500 mg in sodium chloride 0.9 % 100 mL IVPB  Status:  Discontinued     500 mg 100 mL/hr over 60 Minutes Intravenous Every 12 hours 11/10/16 1951 11/11/16 0956   11/10/16 2200  ceFEPIme (MAXIPIME) 1 g in dextrose 5 % 50 mL IVPB  Status:  Discontinued     1 g 100 mL/hr over 30 Minutes Intravenous Every 12 hours 11/10/16 1951 11/11/16 0926   11/10/16 2000  ceFEPIme (MAXIPIME) 2 g in dextrose 5 % 50 mL IVPB  Status:  Discontinued     2 g 100 mL/hr over 30 Minutes Intravenous  Once 11/10/16 1952 11/10/16 1954   11/10/16 2000  vancomycin (VANCOCIN) IVPB 1000 mg/200 mL premix  Status:  Discontinued     1,000 mg 200 mL/hr over 60 Minutes Intravenous  Once 11/10/16 1952 11/10/16 1954   11/10/16 1430  piperacillin-tazobactam (ZOSYN) IVPB 3.375 g     3.375 g 100 mL/hr over 30 Minutes Intravenous  Once 11/10/16 1426 11/10/16 1503   11/10/16 1430  vancomycin (VANCOCIN) IVPB 1000 mg/200 mL premix     1,000 mg 200 mL/hr over 60 Minutes Intravenous  Once 11/10/16 1426 11/10/16 1653        Subjective:  No acute complaint. No nausea no vomiting or shortness of breath. I had extensive discussion with the patient her father as well as her mother regarding discontinuing Xanax and monitoring her overnight. All questions answered.  Objective:   Vitals:   12/05/16 0012 12/05/16 0323 12/05/16 0543 12/05/16 1550  BP:   116/84 113/71  Pulse:   83 90  Resp:   16 20  Temp:   98.5 F (36.9 C) 98.2 F (36.8 C)  TempSrc:      SpO2:   98% 97%  Weight: 55.9 kg (123 lb 4.8 oz) 55.8 kg (123 lb)    Height:        Intake/Output Summary (Last 24 hours) at 12/05/16 1908 Last data filed at  12/05/16 0501  Gross per 24 hour  Intake               10 ml  Output                0 ml  Net               10 ml     Wt Readings from Last 3 Encounters:  12/05/16 55.8 kg (123 lb)  03/14/16 58.5 kg (129 lb)  01/19/16 58.1 kg (128 lb)    Gen: Awake, Alert, Oriented X 3,  HEENT: PERRLA, Neck supple, no JVD Lungs: Good air movement bilaterally, CTAB CVS: RRR, systolic murmur Abd: soft, Non tender, non distended, BS present Extremities: No Cyanosis, Clubbing or edema Skin: no new rashes   Data Reviewed:  I have personally reviewed following labs and imaging studies  Micro Results No results found for this or any previous visit (from the past 240 hour(s)).  Radiology Reports Dg Chest 2 View  Result Date: 11/23/2016 CLINICAL DATA:  Chest tube removal EXAM: CHEST  2 VIEW COMPARISON:  11/21/2016 FINDINGS: Interval removal of right chest tube with residual small right pleural effusion versus pleural thickening. No pneumothorax. Patchy bilateral airspace opacities are again noted, unchanged. Heart is borderline in size. IMPRESSION: Interval removal of right chest tube without pneumothorax. Otherwise no change. Electronically Signed   By: Charlett Nose M.D.   On: 11/23/2016 08:44   Dg Chest 2 View  Result Date: 11/21/2016 CLINICAL DATA:  Chest soreness.  Chest tube . EXAM: CHEST  2 VIEW COMPARISON:  CT 11/20/2016.  Chest x-ray 11/20/2016 FINDINGS: Chest tube noted on the right. Tiny right anterior hydropneumothorax again noted. No interim change . Stable cardiomegaly. Persistent bilateral pulmonary nodules including cavitary nodules. Reference is made to prior CT report. Small right pleural effusion. No pneumothorax. IMPRESSION: 1. Right chest tube in stable position. Tiny right anterior hydropneumothorax again noted. No interim change. 2. Multiple bilateral pulmonary nodules with cavitation. Reference is made to prior CT report of 11/20/2016 . Electronically Signed   By: Maisie Fus  Register   On:  11/21/2016 11:15   Dg Chest 2 View  Result Date: 11/19/2016 CLINICAL DATA:  Pneumothorax. EXAM: CHEST  2 VIEW COMPARISON:  11/19/2016. FINDINGS: Right chest tube in stable position. No significant pneumothorax noted on today's exam. Small right pleural effusion. Bilateral patchy pulmonary infiltrates with cavitation again noted. Small left pleural effusion noted. Heart size stable. No acute bony abnormality . IMPRESSION: 1. Right chest tube in stable position. No pneumothorax noted on today's exam. Small right pleural effusion. 2. Persistent bilateral cavitary pulmonary infiltrates. Small left pleural effusion. Electronically Signed   By: Maisie Fus  Register   On: 11/19/2016 16:53   Dg Chest 2 View  Result Date: 11/10/2016 CLINICAL DATA:  Chest pain, shortness of breath for 1 week EXAM: CHEST  2 VIEW COMPARISON:  CT chest 03/12/2016, chest x-ray 03/12/2016 FINDINGS: There are bilateral nodular airspace opacities throughout the upper and lower lobes bilaterally. There is no pleural effusion or pneumothorax. The heart and mediastinal contours are unremarkable. The osseous structures are unremarkable. IMPRESSION: New bilateral nodular airspace opacities throughout the upper and lower lobes bilaterally. Findings are concerning for multifocal infection such as septic emboli. Recommend further evaluation with a CT of the chest with intravenous contrast. Electronically Signed   By: Elige Ko   On: 11/10/2016 13:23   Ct Chest Wo Contrast  Result Date: 11/20/2016 CLINICAL DATA:  Follow-up right pleural effusion EXAM: CT CHEST WITHOUT CONTRAST TECHNIQUE: Multidetector CT  imaging of the chest was performed following the standard protocol without IV contrast. COMPARISON:  Radiograph 11/20/2016, CT chest 11/10/2016, prior radiographs dating back to 11/10/2016 FINDINGS: Cardiovascular: Limited assessment without intravenous contrast. Small fluid in the superior pericardial recess. There is cardiomegaly.  Mediastinum/Nodes: Difficult evaluation without contrast. Mediastinal adenopathy is present right paratracheal soft tissue fullness, possible adenopathy does not appear significantly changed. Midline trachea. Esophagus grossly unremarkable. Lungs/Pleura: Interim placement of a right lower lateral chest tube since the prior CT with the tip terminating along the right upper posterior pleural surface. Small bilateral pleural effusions right greater than left, slightly increased compared to the previous CT. Tiny right anterior hydropneumothorax. Multiple bilateral pulmonary nodules and cavitary lesions. Some of the cavitary lesions have decreased, however if there appears to be increased nodules/disease most notable in the left upper lobe and the right lower lobe. Decreased septal thickening compared to the previous exam. Upper Abdomen: Spleen appears enlarged. Partially visualized surgical clips in the gallbladder fossa. Musculoskeletal: No acute or suspicious bone lesion. IMPRESSION: 1. Interim placement of right-sided chest tube. Small residual pleural effusions right greater than left, mildly increased compared to the prior chest CT. Small right anterior hydropneumothorax. 2. Innumerable bilateral lung nodules and cavitary lesions, suspected to represent septic emboli. This appears increased in the left upper and right lower lobe since the prior CT. 3. Suspect mediastinal adenopathy although evaluation of the mediastinum is limited without contrast 4. Splenomegaly Electronically Signed   By: Jasmine PangKim  Fujinaga M.D.   On: 11/20/2016 21:25   Ct Chest Wo Contrast  Result Date: 11/10/2016 CLINICAL DATA:  She c/o some chest discomfrot and shortness of breath x 1 week. She states these are similar s/s she had a year ago, at which time she was dx with "pulmonary abscess". She is in no distress. IV drug user EXAM: CT CHEST WITHOUT CONTRAST TECHNIQUE: Multidetector CT imaging of the chest was performed following the standard  protocol without IV contrast. COMPARISON:  Chest CT angiogram dated 03/12/2016. FINDINGS: Cardiovascular: Probable small pericardial effusion and/or pericardial thickening, difficult to characterize without IV contrast. Heart size is within normal limits. Thoracic aorta appears to be normal in caliber. Mediastinum/Nodes: Soft tissue density material within the right upper peritracheal space, also difficult to characterize without IV contrast, presumed lymphadenopathy. Additional mild lymphadenopathy within the anterior mediastinum and aortopulmonary window region. Lungs/Pleura: Numerous cavitary consolidations throughout both lungs, peripherally distributed suggesting septic emboli. More confluent consolidations at each lung base, more likely atelectasis than pneumonia. Small right pleural effusion. Upper Abdomen: Suspected splenomegaly, incompletely imaged. Musculoskeletal: No acute or suspicious osseous finding. IMPRESSION: 1. Numerous cavitary consolidations throughout both lungs, with a peripheral distribution suggesting septic emboli. Largest cavitary consolidation is within the left lower lobe measuring approximately 4 cm greatest dimension. 2. Probable small pericardial effusion and/or pericardial wall thickening, difficult to delineate without IV contrast. 3. Probable mediastinal lymphadenopathy, again difficult to definitively characterize without IV contrast, alternatively confluent fluid or edema within the mediastinum. 4. Small right pleural effusion. 5. Probable splenomegaly, incompletely imaged at the lower aspects of this chest CT. 6. Right IJ line in place with tip passing through the right atrium and into the right ventricle. Recommend retracting to the cavoatrial junction for optimal radiographic positioning. These results and recommendations were called by telephone at the time of interpretation on 11/10/2016 at 7:35 pm to Dr. Katrinka BlazingSmith , who verbally acknowledged these results. Electronically Signed    By: Bary RichardStan  Maynard M.D.   On: 11/10/2016 19:36   Ct  Abdomen Pelvis W Contrast  Result Date: 11/14/2016 CLINICAL DATA:  Right upper quadrant pain times 2-3 days. Polysubstance abuse. Cholecystectomy. History of endocarditis and septic emboli. EXAM: CT ABDOMEN AND PELVIS WITH CONTRAST TECHNIQUE: Multidetector CT imaging of the abdomen and pelvis was performed using the standard protocol following bolus administration of intravenous contrast. CONTRAST:  80mL ISOVUE-300 IOPAMIDOL (ISOVUE-300) INJECTION 61% COMPARISON:  03/12/2016 chest CT FINDINGS: Lower chest: Small right effusion with several bilateral cavitary opacities in both lower lobes concerning for septic emboli or cavitary pneumonia. More confluent airspace opacity in the left lower lobe is also noted. The visualized heart is normal in size. There is no pericardial effusion. No large central pulmonary embolus. Hepatobiliary: Normal appearance of the liver without space-occupying mass or biliary dilatation. Cholecystectomy. Pancreas: No pancreatic mass or ductal dilatation. Mild enlargement of the pancreatic head with surrounding peripancreatic fluid is noted. Correlate to exclude a pancreatitis. Spleen: The spleen is enlarged measuring 15.7 x 5.6 x 17 cm (volume = 780 cm^3) and demonstrates a developmental cleft, partially visualized on prior chest CT and therefore believed less likely to represent a laceration. Adrenals/Urinary Tract: Adrenal glands are unremarkable. Kidneys are normal, without renal calculi, solid appearing lesions, or hydronephrosis. There appear to be pair of water attenuating cysts in the lower pole the right kidney measuring up to 2 x 1.3 cm in aggregate. Bladder is unremarkable. Stomach/Bowel: Stomach is within normal limits. Appendix appears normal. No evidence of bowel wall thickening, distention, or inflammatory changes. Vascular/Lymphatic: No significant vascular findings are present. No enlarged abdominal or pelvic lymph nodes.  Reproductive: Uterus and bilateral adnexa are unremarkable. Other: Moderate volume of ascites.  Anasarca. Musculoskeletal: No acute or significant osseous findings. IMPRESSION: 1. Bibasilar, multifocal cavitary lesions noted within the visualized lower lobes consistent with septic emboli or possibly cavitary pneumonia. More confluent airspace disease in the left lower lobe without cavitation is also present. There is a small right pleural effusion. 2. Anasarca with moderate volume of ascites noted within the abdomen and pelvis. Question right heart dysfunction/failure. 3. Splenomegaly. 4. Water attenuating cysts in the lower pole the right kidney in aggregate measuring 2 x 1.3 cm. 5. Status post cholecystectomy. Electronically Signed   By: Tollie Eth M.D.   On: 11/14/2016 16:26   Dg Chest 1v Repeat Same Day  Result Date: 11/18/2016 CLINICAL DATA:  Chest tube placement EXAM: CHEST - 1 VIEW SAME DAY COMPARISON:  Earlier same day FINDINGS: Large bore chest tube placed on the right along the lateral margin. Marked reduction an amount of right pleural air, nearly completely evacuated. Patchy infiltrates persist in both lower lobes. IMPRESSION: Large bore chest tube placed. Near complete resolution of right pneumothorax. Electronically Signed   By: Paulina Fusi M.D.   On: 11/18/2016 11:40   Dg Chest Port 1 View  Result Date: 11/20/2016 CLINICAL DATA:  26 year old female with pneumothorax. Recent transesophageal echocardiogram. Subsequent encounter. EXAM: PORTABLE CHEST 1 VIEW COMPARISON:  11/20/2016 8:30 a.m. FINDINGS: Right-sided chest tube remains in place. No pneumothorax detected on this frontal projection. Cavitary lesions bilaterally. Left base consolidation. Atelectasis/pleural thickening right lung base. Cardiomegaly. IMPRESSION: No significant change since exam earlier today. Electronically Signed   By: Lacy Duverney M.D.   On: 11/20/2016 13:55   Dg Chest Port 1 View  Result Date: 11/20/2016 CLINICAL  DATA:  Chest tube, shortness of Breath EXAM: PORTABLE CHEST 1 VIEW COMPARISON:  11/19/2016 FINDINGS: Right chest tube remains in place, unchanged. No pneumothorax. Patchy bilateral airspace opacities are again  noted, some with cavitation. Small right pleural effusion. Heart is borderline in size. IMPRESSION: Right chest tube remains in place without pneumothorax. Stable patchy bilateral airspace disease, some of which is cavitary. Electronically Signed   By: Charlett Nose M.D.   On: 11/20/2016 08:42   Dg Chest Port 1 View  Result Date: 11/19/2016 CLINICAL DATA:  26 year old female with cavitary lung lesions possibly from septic endocarditis. Subsequent encounter. EXAM: PORTABLE CHEST 1 VIEW COMPARISON:  11/18/2016. FINDINGS: Right-sided chest tube remains in place. Tiny hydropneumothorax versus fluid within minor fissure noted. Multiple cavitary lesions. Right-sided pleural effusion. Basilar atelectasis versus infiltrate greater on the left. Cardiomegaly. Mild scoliosis. IMPRESSION: Right-sided chest tube remains in place. Tiny hydropneumothorax versus fluid within minor fissure. Multiple cavitary lesions. Right-sided pleural effusion. Basilar atelectasis versus infiltrate greater on the left. Electronically Signed   By: Lacy Duverney M.D.   On: 11/19/2016 07:16   Dg Chest Port 1 View  Result Date: 11/18/2016 CLINICAL DATA:  Followup left pneumothorax. Patient with bacterial endocarditis. EXAM: PORTABLE CHEST 1 VIEW COMPARISON:  11/17/2016 and prior studies FINDINGS: A moderate right hydropneumothorax is unchanged in size, but now with pleural fluid in its basilar portion. A right thoracostomy tube is unchanged. Scattered pulmonary opacities/ nodules and left lower lung consolidations/atelectasis again noted. There has been no other interval change. IMPRESSION: Moderate right hydropneumothorax, unchanged in size but now with pleural fluid in its basilar portion. Right thoracostomy tube remains unchanged.  Electronically Signed   By: Harmon Pier M.D.   On: 11/18/2016 10:14   Dg Chest Port 1 View  Result Date: 11/17/2016 CLINICAL DATA:  Follow-up right-sided chest tube placement. Initial encounter. EXAM: PORTABLE CHEST 1 VIEW COMPARISON:  Chest radiograph performed 11/16/2016 FINDINGS: The patient's moderate right-sided pneumothorax has decreased in size. The right-sided chest tube is grossly unchanged in appearance. Vascular congestion is noted. Patchy left-sided airspace opacities raise concern for pneumonia. Right-sided airspace opacity may reflect atelectasis. A small left pleural effusion is noted. The cardiomediastinal silhouette is mildly enlarged. No acute osseous abnormalities are identified. IMPRESSION: 1. Moderate right-sided pneumothorax has decreased in size. Right-sided chest tube is grossly unchanged in appearance. 2. Vascular congestion and mild cardiomegaly. Patchy left-sided airspace opacities raise concern for pneumonia. Small left pleural effusion noted. 3. Right-sided airspace opacity may reflect atelectasis. Electronically Signed   By: Roanna Raider M.D.   On: 11/17/2016 00:33   Dg Chest Port 1 View  Result Date: 11/16/2016 CLINICAL DATA:  Chest tube placement for right pneumothorax. EXAM: PORTABLE CHEST 1 VIEW COMPARISON:  Single-view of the chest earlier today and 11/11/2016. FINDINGS: Pigtail catheter is now in place in the right chest. Large right pneumothorax is unchanged. Patchy airspace disease in cavitary lesions in the left chest persist. Heart size is normal. IMPRESSION: No change in large right pneumothorax after chest tube placement. No change in patchy airspace disease in cavitary lesions on the left. These results were called by telephone at the time of interpretation on 11/16/2016 at 8:10 pm to Estrella Deeds, RN, who verbally acknowledged these results. Electronically Signed   By: Drusilla Kanner M.D.   On: 11/16/2016 20:12   Dg Chest Port 1 View  Result Date:  11/16/2016 CLINICAL DATA:  Shortness of Breath EXAM: PORTABLE CHEST 1 VIEW COMPARISON:  11/11/2016 FINDINGS: Cardiac shadow is within normal limits. Cavitary nodular densities are noted throughout both lungs similar to that seen on previous CT examination. A new right-sided moderately large pneumothorax is noted with consolidation of the lower lobe  on the right. No acute bony abnormality is noted. IMPRESSION: Moderately large right-sided pneumothorax new from the prior exam. Near complete collapse of the right lower lobe is noted. Stable cavitary lesions within both lungs. Critical Value/emergent results were called by telephone at the time of interpretation on 11/16/2016 at 6:24 pm to Dr Leitha Bleak, who verbally acknowledged these results. Electronically Signed   By: Alcide Clever M.D.   On: 11/16/2016 18:27   Dg Chest Port 1 View  Result Date: 11/11/2016 CLINICAL DATA:  Check right IJ line placement.  Initial encounter. EXAM: PORTABLE CHEST 1 VIEW COMPARISON:  Chest radiograph performed 11/10/2016 FINDINGS: The right IJ line is noted ending at the right atrium. This could be retracted 2-3 cm. Mild vascular congestion is noted. Mild left-sided opacities may reflect atelectasis or mild pneumonia. No pleural effusion or pneumothorax is seen. The cardiomediastinal silhouette is borderline normal in size. No acute osseous abnormalities are identified. IMPRESSION: 1. Right IJ line noted ending at the right atrium. This could be retracted 2-3 cm, as deemed clinically appropriate. 2. Mild vascular congestion noted. 3. Mild left-sided airspace opacities may reflect atelectasis or mild pneumonia. Electronically Signed   By: Roanna Raider M.D.   On: 11/11/2016 00:37   Dg Chest Port 1 View  Result Date: 11/10/2016 CLINICAL DATA:  Retraction of the IJ line about 10 cm EXAM: PORTABLE CHEST 1 VIEW COMPARISON:  CT chest 11/10/2016.  Chest 11/10/2016. FINDINGS: The left central venous catheter tip localizes to the mid/distal  right atrial region. If placement at or above the cavoatrial junction is desired, retraction of about 3-4 cm is recommended. No pneumothorax. Shallow inspiration with atelectasis in the lung bases. Borderline heart size. Patchy airspace infiltrates in both lungs could indicate edema, aspiration, or pneumonia. No blunting of costophrenic angles. IMPRESSION: Central venous catheter tip is in the mid/ low right atrial region. If placement at or above the cavoatrial junction is desired, retraction of about 3-4 cm is recommended. Electronically Signed   By: Burman Nieves M.D.   On: 11/10/2016 21:35   Dg Chest Portable 1 View  Result Date: 11/10/2016 CLINICAL DATA:  Status post central line placement. EXAM: PORTABLE CHEST 1 VIEW COMPARISON:  Chest x-ray from earlier same day. FINDINGS: Interval placement of a right IJ central line with tip passing to the left of midline and likely in the right ventricle or main pulmonary artery. The bilateral small nodular airspace opacities throughout both lungs are unchanged in the short-term interval. Suspect small bilateral pleural effusions. No pneumothorax seen. IMPRESSION: 1. Right IJ central line placement with tip positioned to the left of midline either within the right ventricle or main pulmonary artery. Recommend retracting at least 10 cm. 2. Bilateral small nodular airspace opacities throughout both lungs are unchanged in the short-term interval. As recommended on earlier chest x-ray report, would consider chest CT for further characterization. 3. Suspect small bilateral pleural effusions. Electronically Signed   By: Bary Richard M.D.   On: 11/10/2016 18:37    Lab Data:  CBC:  Recent Labs Lab 11/29/16 0409 11/30/16 0440  WBC 6.6 7.6  HGB 9.4* 9.9*  HCT 29.4* 31.2*  MCV 87.5 87.6  PLT 317 327   Basic Metabolic Panel:  Recent Labs Lab 12/01/16 0349 12/02/16 0432 12/03/16 0428 12/04/16 0446 12/05/16 0446  NA 138  --  138  --   --   K 3.4*  --   3.5  --   --   CL 104  --  103  --   --   CO2 28  --  28  --   --   GLUCOSE 95  --  92  --   --   BUN 10  --  9  --   --   CREATININE 0.68 0.63 0.63 0.65 0.61  CALCIUM 8.5*  --  8.9  --   --    GFR: Estimated Creatinine Clearance: 88.2 mL/min (by C-G formula based on SCr of 0.61 mg/dL). Liver Function Tests: No results for input(s): AST, ALT, ALKPHOS, BILITOT, PROT, ALBUMIN in the last 168 hours. No results for input(s): LIPASE, AMYLASE in the last 168 hours. No results for input(s): AMMONIA in the last 168 hours. Coagulation Profile: No results for input(s): INR, PROTIME in the last 168 hours. Cardiac Enzymes: No results for input(s): CKTOTAL, CKMB, CKMBINDEX, TROPONINI in the last 168 hours. BNP (last 3 results) No results for input(s): PROBNP in the last 8760 hours. HbA1C: No results for input(s): HGBA1C in the last 72 hours. CBG: No results for input(s): GLUCAP in the last 168 hours. Lipid Profile: No results for input(s): CHOL, HDL, LDLCALC, TRIG, CHOLHDL, LDLDIRECT in the last 72 hours. Thyroid Function Tests: No results for input(s): TSH, T4TOTAL, FREET4, T3FREE, THYROIDAB in the last 72 hours. Anemia Panel: No results for input(s): VITAMINB12, FOLATE, FERRITIN, TIBC, IRON, RETICCTPCT in the last 72 hours. Urine analysis:    Component Value Date/Time   COLORURINE YELLOW 11/10/2016 2132   APPEARANCEUR CLEAR 11/10/2016 2132   LABSPEC 1.006 11/10/2016 2132   PHURINE 5.0 11/10/2016 2132   GLUCOSEU NEGATIVE 11/10/2016 2132   HGBUR MODERATE (A) 11/10/2016 2132   BILIRUBINUR NEGATIVE 11/10/2016 2132   KETONESUR NEGATIVE 11/10/2016 2132   PROTEINUR NEGATIVE 11/10/2016 2132   UROBILINOGEN 0.2 08/05/2010 1821   NITRITE NEGATIVE 11/10/2016 2132   LEUKOCYTESUR SMALL (A) 11/10/2016 2132     Lynden Oxford M.D. [facility] 12/05/2016, 7:08 PM

## 2016-12-05 NOTE — Progress Notes (Signed)
[facility] is unable to accept patient on Xanax (no benzos allowed while taking suboxone). Patient and her mother have stated that they do not want the patient to discontinue the Xanax. CSW Interior and spatial designerDirector and MD notified. MD will have conversation with patient regarding xanax.   Osborne Cascoadia Luke Falero LCSWA [phone]

## 2016-12-06 LAB — CREATININE, SERUM
CREATININE: 0.72 mg/dL (ref 0.44–1.00)
GFR calc non Af Amer: >60 mL/min (ref >60)

## 2016-12-06 LAB — VANCOMYCIN, TROUGH: Vancomycin Tr: 17 ug/mL (ref 15–20)

## 2016-12-06 MED ORDER — ALPRAZOLAM 0.25 MG PO TABS
0.2500 mg | ORAL_TABLET | Freq: Every evening | ORAL | Status: AC | PRN
  Administered 2016-12-08 – 2016-12-09 (×2): 0.25 mg via ORAL
  Filled 2016-12-06 (×3): qty 1

## 2016-12-06 MED ORDER — ALPRAZOLAM 0.25 MG PO TABS
0.2500 mg | ORAL_TABLET | Freq: Two times a day (BID) | ORAL | Status: AC | PRN
  Administered 2016-12-06 – 2016-12-08 (×4): 0.25 mg via ORAL
  Filled 2016-12-06 (×3): qty 1

## 2016-12-06 NOTE — Progress Notes (Signed)
[facility]                                                                              Patient Demographics  Savannah Palmer, is a 26 y.o. female, DOB - 02-04-1991, NWG:[id number]  Admit date - 11/10/2016   Admitting Physician Coralyn Helling, MD  Outpatient Primary MD for the patient is Patient, No Pcp Per  Outpatient specialists:   LOS - 26  days   Medical records reviewed and are as summarized below:    No chief complaint on file.      Brief summary    26 year old Caucasian female with a past medical history of anxiety, depression, heroin abuse, presented with weakness, cough and chest pain. Initially was in septic shock and required pressors. Blood cultures grew MRSA. 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 She was placed on IV antibiotics. She also developed a spontaneous right-sided pneumothorax. Chest tube was placed by critical care medicine. She was transferred to [facility] for opinion from cardiothoracic surgery. Patient is not a candidate for surgery at this time. PTX, resolved, remains stable,  inpatient on IV Vanc and Po suboxone till 7/15   Assessment & Plan    Principal Problem: MRSA bacteremia/Septic shock with right TV endocarditis and septic emboli to lungs/MRSA empyema - Patient presented with weakness, coughing, chest pain and septic shock requiring vasopressors. Blood cultures 5/27 grew out MRSA and was found to have septic emboli throughout her lungs.  -Repeat cultures were still positive, blood cultures repeat on 6/4 has been negative to date  - 2-D echo on 5/27 showed tricuspid vegetations with moderate TR. TEE on 6/1 showed large tricuspid vegetation, severe TR with dilated RV along with a loculated left effusion - HIV, Hep C negative. Due to family request, RPR was done and it was negative. - Due to persistent bacteremia patient was started on daptomycin  6/2 and Ceftaroline was continued to cover lung infection. Per ID recommendations, Ceftaroline and daptomycin have been now discontinued. - cardiothoracic surgery was also consulted and patient was deemed not a candidate for surgery at this time.  - on vancomycin duration through July 15, will need BMP, CBC weekly, trough goal of 15-20.  - PICC line placed on 6/9, continue vancomycin, until July 15 -remains stable   Right spontaneous pneumothorax- acute respiratory failure/MRSA Empyema -Right empyema with pneumothorax, positive for MRSA. Status post chest tube. - Chest tube removed on 6/7, chest x-ray clear  -resolved  Acute kidney injury -Baseline creatinine 0.7, creatinine increased to 1.37, likely secondary to dehydration and NSAIDs. - patient received IV fluids, NSAIDs were discontinued, and creatinine function has normalized.   RUQ pain, likely musculoskeletal -CT abdomen and pelvis showed no liver or gallbladder abnormalities, possible musculoskeletal origin, currently stable.    Ascites, anasarca due to right sided hear failure - as a result of severe TR, net positive balance of 12L - O2 sats 96% on room air -on low dose lasix for edema  Splenomegaly - etiology undetermined- CT on 5/30 revealed that the spleen is enlarged measuring 15.7 x  5.6 x 17 cm (volume= 780 cm^3) - liver is unremarkable on CT  Normocytic Anemia - H&H currently stable, was transfused on 6/5, no overt bleeding noted - Iron was 12. Ferritin 213. B-12 1850. Folate 7.8. - Hemoglobin down to 7.1 on 6/10, patient was transfused 2 units packed RBC with 1 dose of IV Feraheme, currently stable  Hypokalemia/Hypophosphatemia/Hypomagnesemia - Potassium 3.3, replaced  Thrombocytopenia - Resolved  Heroin abuse - cont Suboxone, will need transition to a drug rehab program once stable.  Anxiety - Celexa discontinued per patient's request.  - Buspar restarted per patient's request.  - continue  Neurontin.  Initially Suboxone clinic requested to discontinue benzodiazepine right away, on 12/06/2016 I was informed that the clinic will not take the patient and recommend outpatient rehabilitation program to help arrange Suboxone. Ideally patient would require a slow taper off Xanax, I would reduce the dose to half for next 2 days twice a day as needed and reduce it to once a day as needed and then we'll stop it over next 4 days. Continue supplemented with increasing the dose of the BuSpar as well as increasing the dose of the Neurontin. Patient refused to take any medication in SSRI TCA or  SNRI group.  Nicotine dependence -Continue  Nicoderm patch  Code Status: Full CODE STATUS  DVT Prophylaxis:  heparin  Family Communication: none at bedside Disposition Plan: Disposition difficulty due to patient being on suboxone and history of IVDA  Time Spent in minutes  45 minutes  Procedures:  TEE  - Left ventricle: The cavity size was normal. Wall thickness was normal. Systolic function was normal. The estimated ejection fraction was in the range of 55% to 60%. Wall motion was normal; there were no regional wall motion abnormalities. - Left atrium: No evidence of thrombus in the atrial cavity or appendage. - Right ventricle: The cavity size was mildly dilated. Wall thickness was normal. - Right atrium: No evidence of thrombus in the atrial cavity or appendage. - Tricuspid valve: Flail motion of the posterior leaflet. There is a 8x5 mm vegetation attached to the posterior leaflet, but most of the &quot;mass&quot; described on transthoracic imaging appears to be the flail segment of the valve. There was severe regurgitation directed eccentrically and toward the free wall.  Right chest tube placement for pneumothorax 6/1  Placement of a larger bore chest tube in the right chest 6/3  PICC line placed on 6/9  Consultants:   Infectious disease Cardiothoracic  surgery  Antimicrobials:   Daptomycin 6/2> 6/8  Teflaro 5/31> 6/8  Vancomycin 6/8>> will need till July 15   Medications  Scheduled Meds: . buprenorphine-naloxone  2 tablet Sublingual Daily  . busPIRone  10 mg Oral TID  . gabapentin  400 mg Oral QID  . heparin  5,000 Units Subcutaneous Q8H  . nicotine  14 mg Transdermal Daily  . potassium chloride  40 mEq Oral Daily  . saccharomyces boulardii  250 mg Oral BID   Continuous Infusions: . sodium chloride 250 mL (11/28/16 2032)  . vancomycin Stopped (12/06/16 1127)   PRN Meds:.sodium chloride, acetaminophen, albuterol, ALPRAZolam, [START ON 12/08/2016] ALPRAZolam, docusate sodium, ibuprofen, nicotine polacrilex, ondansetron (ZOFRAN) IV, oxyCODONE, sodium chloride flush   Antibiotics   Anti-infectives    Start     Dose/Rate Route Frequency Ordered Stop   11/29/16 1000  vancomycin (VANCOCIN) IVPB 750 mg/150 ml premix     750 mg 150 mL/hr over 60 Minutes Intravenous Every 12 hours 11/29/16 0934  11/26/16 2000  vancomycin (VANCOCIN) IVPB 1000 mg/200 mL premix  Status:  Discontinued     1,000 mg 200 mL/hr over 60 Minutes Intravenous Every 12 hours 11/26/16 1912 11/29/16 0853   11/24/16 0000  vancomycin (VANCOCIN) IVPB 1000 mg/200 mL premix  Status:  Discontinued     1,000 mg 200 mL/hr over 60 Minutes Intravenous Every 8 hours 11/23/16 1238 11/26/16 0721   11/23/16 1600  vancomycin (VANCOCIN) 1,500 mg in sodium chloride 0.9 % 500 mL IVPB     1,500 mg 250 mL/hr over 120 Minutes Intravenous  Once 11/23/16 1238 11/23/16 1733   11/17/16 1600  DAPTOmycin (CUBICIN) 500 mg in sodium chloride 0.9 % IVPB  Status:  Discontinued     500 mg 220 mL/hr over 30 Minutes Intravenous Every 24 hours 11/17/16 1432 11/23/16 1238   11/17/16 1500  ceftaroline (TEFLARO) 600 mg in sodium chloride 0.9 % 250 mL IVPB  Status:  Discontinued     600 mg 250 mL/hr over 60 Minutes Intravenous Every 12 hours 11/17/16 1435 11/23/16 1238   11/15/16 1800   ceftaroline (TEFLARO) 600 mg in sodium chloride 0.9 % 250 mL IVPB  Status:  Discontinued     600 mg 250 mL/hr over 60 Minutes Intravenous Every 12 hours 11/15/16 1635 11/17/16 1431   11/13/16 1215  vancomycin (VANCOCIN) IVPB 1000 mg/200 mL premix  Status:  Discontinued     1,000 mg 200 mL/hr over 60 Minutes Intravenous Every 8 hours 11/13/16 1203 11/16/16 0850   11/11/16 1100  vancomycin (VANCOCIN) IVPB 750 mg/150 ml premix  Status:  Discontinued     750 mg 150 mL/hr over 60 Minutes Intravenous Every 8 hours 11/11/16 0956 11/13/16 1203   11/11/16 0400  vancomycin (VANCOCIN) 500 mg in sodium chloride 0.9 % 100 mL IVPB  Status:  Discontinued     500 mg 100 mL/hr over 60 Minutes Intravenous Every 12 hours 11/10/16 1951 11/11/16 0956   11/10/16 2200  ceFEPIme (MAXIPIME) 1 g in dextrose 5 % 50 mL IVPB  Status:  Discontinued     1 g 100 mL/hr over 30 Minutes Intravenous Every 12 hours 11/10/16 1951 11/11/16 0926   11/10/16 2000  ceFEPIme (MAXIPIME) 2 g in dextrose 5 % 50 mL IVPB  Status:  Discontinued     2 g 100 mL/hr over 30 Minutes Intravenous  Once 11/10/16 1952 11/10/16 1954   11/10/16 2000  vancomycin (VANCOCIN) IVPB 1000 mg/200 mL premix  Status:  Discontinued     1,000 mg 200 mL/hr over 60 Minutes Intravenous  Once 11/10/16 1952 11/10/16 1954   11/10/16 1430  piperacillin-tazobactam (ZOSYN) IVPB 3.375 g     3.375 g 100 mL/hr over 30 Minutes Intravenous  Once 11/10/16 1426 11/10/16 1503   11/10/16 1430  vancomycin (VANCOCIN) IVPB 1000 mg/200 mL premix     1,000 mg 200 mL/hr over 60 Minutes Intravenous  Once 11/10/16 1426 11/10/16 1653      Subjective:  Complains about anxiety 7 out of 10. No chest pain, shortness of breath, nausea, vomiting, diarrhea, constipation. Oral intake is adequate.  Objective:   Vitals:   12/05/16 2241 12/06/16 0500 12/06/16 0524 12/06/16 1424  BP: 104/72  102/70 105/64  Pulse: 82  88 79  Resp: 18  16 (!) 6  Temp: 97.8 F (36.6 C)  97.5 F (36.4 C)  98.8 F (37.1 C)  TempSrc: Oral  Oral   SpO2: 100%  98% 98%  Weight:  57.7 kg (127 lb 1.6 oz)  Height:        Intake/Output Summary (Last 24 hours) at 12/06/16 1656 Last data filed at 12/06/16 0417  Gross per 24 hour  Intake              400 ml  Output                0 ml  Net              400 ml     Wt Readings from Last 3 Encounters:  12/06/16 57.7 kg (127 lb 1.6 oz)  03/14/16 58.5 kg (129 lb)  01/19/16 58.1 kg (128 lb)    Gen: Awake, Alert, Oriented X 3, No evidence of anxiety on clinical examination HEENT: PERRLA, Neck supple, no JVD Lungs: Good air movement bilaterally, CTAB CVS: RRR, systolic murmur Abd: soft, Non tender, non distended, BS present Extremities: No Cyanosis, Clubbing or edema Skin: no new rashes   Data Reviewed:  I have personally reviewed following labs and imaging studies  Micro Results No results found for this or any previous visit (from the past 240 hour(s)).  Radiology Reports Dg Chest 2 View  Result Date: 11/23/2016 CLINICAL DATA:  Chest tube removal EXAM: CHEST  2 VIEW COMPARISON:  11/21/2016 FINDINGS: Interval removal of right chest tube with residual small right pleural effusion versus pleural thickening. No pneumothorax. Patchy bilateral airspace opacities are again noted, unchanged. Heart is borderline in size. IMPRESSION: Interval removal of right chest tube without pneumothorax. Otherwise no change. Electronically Signed   By: Charlett Nose M.D.   On: 11/23/2016 08:44   Dg Chest 2 View  Result Date: 11/21/2016 CLINICAL DATA:  Chest soreness.  Chest tube . EXAM: CHEST  2 VIEW COMPARISON:  CT 11/20/2016.  Chest x-ray 11/20/2016 FINDINGS: Chest tube noted on the right. Tiny right anterior hydropneumothorax again noted. No interim change . Stable cardiomegaly. Persistent bilateral pulmonary nodules including cavitary nodules. Reference is made to prior CT report. Small right pleural effusion. No pneumothorax. IMPRESSION: 1. Right chest tube in  stable position. Tiny right anterior hydropneumothorax again noted. No interim change. 2. Multiple bilateral pulmonary nodules with cavitation. Reference is made to prior CT report of 11/20/2016 . Electronically Signed   By: Maisie Fus  Register   On: 11/21/2016 11:15   Dg Chest 2 View  Result Date: 11/19/2016 CLINICAL DATA:  Pneumothorax. EXAM: CHEST  2 VIEW COMPARISON:  11/19/2016. FINDINGS: Right chest tube in stable position. No significant pneumothorax noted on today's exam. Small right pleural effusion. Bilateral patchy pulmonary infiltrates with cavitation again noted. Small left pleural effusion noted. Heart size stable. No acute bony abnormality . IMPRESSION: 1. Right chest tube in stable position. No pneumothorax noted on today's exam. Small right pleural effusion. 2. Persistent bilateral cavitary pulmonary infiltrates. Small left pleural effusion. Electronically Signed   By: Maisie Fus  Register   On: 11/19/2016 16:53   Dg Chest 2 View  Result Date: 11/10/2016 CLINICAL DATA:  Chest pain, shortness of breath for 1 week EXAM: CHEST  2 VIEW COMPARISON:  CT chest 03/12/2016, chest x-ray 03/12/2016 FINDINGS: There are bilateral nodular airspace opacities throughout the upper and lower lobes bilaterally. There is no pleural effusion or pneumothorax. The heart and mediastinal contours are unremarkable. The osseous structures are unremarkable. IMPRESSION: New bilateral nodular airspace opacities throughout the upper and lower lobes bilaterally. Findings are concerning for multifocal infection such as septic emboli. Recommend further evaluation with a CT of the chest with intravenous contrast. Electronically Signed  By: Elige Ko   On: 11/10/2016 13:23   Ct Chest Wo Contrast  Result Date: 11/20/2016 CLINICAL DATA:  Follow-up right pleural effusion EXAM: CT CHEST WITHOUT CONTRAST TECHNIQUE: Multidetector CT imaging of the chest was performed following the standard protocol without IV contrast. COMPARISON:   Radiograph 11/20/2016, CT chest 11/10/2016, prior radiographs dating back to 11/10/2016 FINDINGS: Cardiovascular: Limited assessment without intravenous contrast. Small fluid in the superior pericardial recess. There is cardiomegaly. Mediastinum/Nodes: Difficult evaluation without contrast. Mediastinal adenopathy is present right paratracheal soft tissue fullness, possible adenopathy does not appear significantly changed. Midline trachea. Esophagus grossly unremarkable. Lungs/Pleura: Interim placement of a right lower lateral chest tube since the prior CT with the tip terminating along the right upper posterior pleural surface. Small bilateral pleural effusions right greater than left, slightly increased compared to the previous CT. Tiny right anterior hydropneumothorax. Multiple bilateral pulmonary nodules and cavitary lesions. Some of the cavitary lesions have decreased, however if there appears to be increased nodules/disease most notable in the left upper lobe and the right lower lobe. Decreased septal thickening compared to the previous exam. Upper Abdomen: Spleen appears enlarged. Partially visualized surgical clips in the gallbladder fossa. Musculoskeletal: No acute or suspicious bone lesion. IMPRESSION: 1. Interim placement of right-sided chest tube. Small residual pleural effusions right greater than left, mildly increased compared to the prior chest CT. Small right anterior hydropneumothorax. 2. Innumerable bilateral lung nodules and cavitary lesions, suspected to represent septic emboli. This appears increased in the left upper and right lower lobe since the prior CT. 3. Suspect mediastinal adenopathy although evaluation of the mediastinum is limited without contrast 4. Splenomegaly Electronically Signed   By: Jasmine Pang M.D.   On: 11/20/2016 21:25   Ct Chest Wo Contrast  Result Date: 11/10/2016 CLINICAL DATA:  She c/o some chest discomfrot and shortness of breath x 1 week. She states these are  similar s/s she had a year ago, at which time she was dx with "pulmonary abscess". She is in no distress. IV drug user EXAM: CT CHEST WITHOUT CONTRAST TECHNIQUE: Multidetector CT imaging of the chest was performed following the standard protocol without IV contrast. COMPARISON:  Chest CT angiogram dated 03/12/2016. FINDINGS: Cardiovascular: Probable small pericardial effusion and/or pericardial thickening, difficult to characterize without IV contrast. Heart size is within normal limits. Thoracic aorta appears to be normal in caliber. Mediastinum/Nodes: Soft tissue density material within the right upper peritracheal space, also difficult to characterize without IV contrast, presumed lymphadenopathy. Additional mild lymphadenopathy within the anterior mediastinum and aortopulmonary window region. Lungs/Pleura: Numerous cavitary consolidations throughout both lungs, peripherally distributed suggesting septic emboli. More confluent consolidations at each lung base, more likely atelectasis than pneumonia. Small right pleural effusion. Upper Abdomen: Suspected splenomegaly, incompletely imaged. Musculoskeletal: No acute or suspicious osseous finding. IMPRESSION: 1. Numerous cavitary consolidations throughout both lungs, with a peripheral distribution suggesting septic emboli. Largest cavitary consolidation is within the left lower lobe measuring approximately 4 cm greatest dimension. 2. Probable small pericardial effusion and/or pericardial wall thickening, difficult to delineate without IV contrast. 3. Probable mediastinal lymphadenopathy, again difficult to definitively characterize without IV contrast, alternatively confluent fluid or edema within the mediastinum. 4. Small right pleural effusion. 5. Probable splenomegaly, incompletely imaged at the lower aspects of this chest CT. 6. Right IJ line in place with tip passing through the right atrium and into the right ventricle. Recommend retracting to the cavoatrial  junction for optimal radiographic positioning. These results and recommendations were called by telephone at the  time of interpretation on 11/10/2016 at 7:35 pm to Dr. Katrinka Blazing , who verbally acknowledged these results. Electronically Signed   By: Bary Richard M.D.   On: 11/10/2016 19:36   Ct Abdomen Pelvis W Contrast  Result Date: 11/14/2016 CLINICAL DATA:  Right upper quadrant pain times 2-3 days. Polysubstance abuse. Cholecystectomy. History of endocarditis and septic emboli. EXAM: CT ABDOMEN AND PELVIS WITH CONTRAST TECHNIQUE: Multidetector CT imaging of the abdomen and pelvis was performed using the standard protocol following bolus administration of intravenous contrast. CONTRAST:  80mL ISOVUE-300 IOPAMIDOL (ISOVUE-300) INJECTION 61% COMPARISON:  03/12/2016 chest CT FINDINGS: Lower chest: Small right effusion with several bilateral cavitary opacities in both lower lobes concerning for septic emboli or cavitary pneumonia. More confluent airspace opacity in the left lower lobe is also noted. The visualized heart is normal in size. There is no pericardial effusion. No large central pulmonary embolus. Hepatobiliary: Normal appearance of the liver without space-occupying mass or biliary dilatation. Cholecystectomy. Pancreas: No pancreatic mass or ductal dilatation. Mild enlargement of the pancreatic head with surrounding peripancreatic fluid is noted. Correlate to exclude a pancreatitis. Spleen: The spleen is enlarged measuring 15.7 x 5.6 x 17 cm (volume = 780 cm^3) and demonstrates a developmental cleft, partially visualized on prior chest CT and therefore believed less likely to represent a laceration. Adrenals/Urinary Tract: Adrenal glands are unremarkable. Kidneys are normal, without renal calculi, solid appearing lesions, or hydronephrosis. There appear to be pair of water attenuating cysts in the lower pole the right kidney measuring up to 2 x 1.3 cm in aggregate. Bladder is unremarkable. Stomach/Bowel:  Stomach is within normal limits. Appendix appears normal. No evidence of bowel wall thickening, distention, or inflammatory changes. Vascular/Lymphatic: No significant vascular findings are present. No enlarged abdominal or pelvic lymph nodes. Reproductive: Uterus and bilateral adnexa are unremarkable. Other: Moderate volume of ascites.  Anasarca. Musculoskeletal: No acute or significant osseous findings. IMPRESSION: 1. Bibasilar, multifocal cavitary lesions noted within the visualized lower lobes consistent with septic emboli or possibly cavitary pneumonia. More confluent airspace disease in the left lower lobe without cavitation is also present. There is a small right pleural effusion. 2. Anasarca with moderate volume of ascites noted within the abdomen and pelvis. Question right heart dysfunction/failure. 3. Splenomegaly. 4. Water attenuating cysts in the lower pole the right kidney in aggregate measuring 2 x 1.3 cm. 5. Status post cholecystectomy. Electronically Signed   By: Tollie Eth M.D.   On: 11/14/2016 16:26   Dg Chest 1v Repeat Same Day  Result Date: 11/18/2016 CLINICAL DATA:  Chest tube placement EXAM: CHEST - 1 VIEW SAME DAY COMPARISON:  Earlier same day FINDINGS: Large bore chest tube placed on the right along the lateral margin. Marked reduction an amount of right pleural air, nearly completely evacuated. Patchy infiltrates persist in both lower lobes. IMPRESSION: Large bore chest tube placed. Near complete resolution of right pneumothorax. Electronically Signed   By: Paulina Fusi M.D.   On: 11/18/2016 11:40   Dg Chest Port 1 View  Result Date: 11/20/2016 CLINICAL DATA:  26 year old female with pneumothorax. Recent transesophageal echocardiogram. Subsequent encounter. EXAM: PORTABLE CHEST 1 VIEW COMPARISON:  11/20/2016 8:30 a.m. FINDINGS: Right-sided chest tube remains in place. No pneumothorax detected on this frontal projection. Cavitary lesions bilaterally. Left base consolidation.  Atelectasis/pleural thickening right lung base. Cardiomegaly. IMPRESSION: No significant change since exam earlier today. Electronically Signed   By: Lacy Duverney M.D.   On: 11/20/2016 13:55   Dg Chest [facility] 1 View  Result  Date: 11/20/2016 CLINICAL DATA:  Chest tube, shortness of Breath EXAM: PORTABLE CHEST 1 VIEW COMPARISON:  11/19/2016 FINDINGS: Right chest tube remains in place, unchanged. No pneumothorax. Patchy bilateral airspace opacities are again noted, some with cavitation. Small right pleural effusion. Heart is borderline in size. IMPRESSION: Right chest tube remains in place without pneumothorax. Stable patchy bilateral airspace disease, some of which is cavitary. Electronically Signed   By: Charlett Nose M.D.   On: 11/20/2016 08:42   Dg Chest Port 1 View  Result Date: 11/19/2016 CLINICAL DATA:  26 year old female with cavitary lung lesions possibly from septic endocarditis. Subsequent encounter. EXAM: PORTABLE CHEST 1 VIEW COMPARISON:  11/18/2016. FINDINGS: Right-sided chest tube remains in place. Tiny hydropneumothorax versus fluid within minor fissure noted. Multiple cavitary lesions. Right-sided pleural effusion. Basilar atelectasis versus infiltrate greater on the left. Cardiomegaly. Mild scoliosis. IMPRESSION: Right-sided chest tube remains in place. Tiny hydropneumothorax versus fluid within minor fissure. Multiple cavitary lesions. Right-sided pleural effusion. Basilar atelectasis versus infiltrate greater on the left. Electronically Signed   By: Lacy Duverney M.D.   On: 11/19/2016 07:16   Dg Chest Port 1 View  Result Date: 11/18/2016 CLINICAL DATA:  Followup left pneumothorax. Patient with bacterial endocarditis. EXAM: PORTABLE CHEST 1 VIEW COMPARISON:  11/17/2016 and prior studies FINDINGS: A moderate right hydropneumothorax is unchanged in size, but now with pleural fluid in its basilar portion. A right thoracostomy tube is unchanged. Scattered pulmonary opacities/ nodules and left  lower lung consolidations/atelectasis again noted. There has been no other interval change. IMPRESSION: Moderate right hydropneumothorax, unchanged in size but now with pleural fluid in its basilar portion. Right thoracostomy tube remains unchanged. Electronically Signed   By: Harmon Pier M.D.   On: 11/18/2016 10:14   Dg Chest Port 1 View  Result Date: 11/17/2016 CLINICAL DATA:  Follow-up right-sided chest tube placement. Initial encounter. EXAM: PORTABLE CHEST 1 VIEW COMPARISON:  Chest radiograph performed 11/16/2016 FINDINGS: The patient's moderate right-sided pneumothorax has decreased in size. The right-sided chest tube is grossly unchanged in appearance. Vascular congestion is noted. Patchy left-sided airspace opacities raise concern for pneumonia. Right-sided airspace opacity may reflect atelectasis. A small left pleural effusion is noted. The cardiomediastinal silhouette is mildly enlarged. No acute osseous abnormalities are identified. IMPRESSION: 1. Moderate right-sided pneumothorax has decreased in size. Right-sided chest tube is grossly unchanged in appearance. 2. Vascular congestion and mild cardiomegaly. Patchy left-sided airspace opacities raise concern for pneumonia. Small left pleural effusion noted. 3. Right-sided airspace opacity may reflect atelectasis. Electronically Signed   By: Roanna Raider M.D.   On: 11/17/2016 00:33   Dg Chest Port 1 View  Result Date: 11/16/2016 CLINICAL DATA:  Chest tube placement for right pneumothorax. EXAM: PORTABLE CHEST 1 VIEW COMPARISON:  Single-view of the chest earlier today and 11/11/2016. FINDINGS: Pigtail catheter is now in place in the right chest. Large right pneumothorax is unchanged. Patchy airspace disease in cavitary lesions in the left chest persist. Heart size is normal. IMPRESSION: No change in large right pneumothorax after chest tube placement. No change in patchy airspace disease in cavitary lesions on the left. These results were called by  telephone at the time of interpretation on 11/16/2016 at 8:10 pm to Estrella Deeds, RN, who verbally acknowledged these results. Electronically Signed   By: Drusilla Kanner M.D.   On: 11/16/2016 20:12   Dg Chest Port 1 View  Result Date: 11/16/2016 CLINICAL DATA:  Shortness of Breath EXAM: PORTABLE CHEST 1 VIEW COMPARISON:  11/11/2016 FINDINGS: Cardiac shadow  is within normal limits. Cavitary nodular densities are noted throughout both lungs similar to that seen on previous CT examination. A new right-sided moderately large pneumothorax is noted with consolidation of the lower lobe on the right. No acute bony abnormality is noted. IMPRESSION: Moderately large right-sided pneumothorax new from the prior exam. Near complete collapse of the right lower lobe is noted. Stable cavitary lesions within both lungs. Critical Value/emergent results were called by telephone at the time of interpretation on 11/16/2016 at 6:24 pm to Dr Leitha Bleak, who verbally acknowledged these results. Electronically Signed   By: Alcide Clever M.D.   On: 11/16/2016 18:27   Dg Chest Port 1 View  Result Date: 11/11/2016 CLINICAL DATA:  Check right IJ line placement.  Initial encounter. EXAM: PORTABLE CHEST 1 VIEW COMPARISON:  Chest radiograph performed 11/10/2016 FINDINGS: The right IJ line is noted ending at the right atrium. This could be retracted 2-3 cm. Mild vascular congestion is noted. Mild left-sided opacities may reflect atelectasis or mild pneumonia. No pleural effusion or pneumothorax is seen. The cardiomediastinal silhouette is borderline normal in size. No acute osseous abnormalities are identified. IMPRESSION: 1. Right IJ line noted ending at the right atrium. This could be retracted 2-3 cm, as deemed clinically appropriate. 2. Mild vascular congestion noted. 3. Mild left-sided airspace opacities may reflect atelectasis or mild pneumonia. Electronically Signed   By: Roanna Raider M.D.   On: 11/11/2016 00:37   Dg Chest Port 1  View  Result Date: 11/10/2016 CLINICAL DATA:  Retraction of the IJ line about 10 cm EXAM: PORTABLE CHEST 1 VIEW COMPARISON:  CT chest 11/10/2016.  Chest 11/10/2016. FINDINGS: The left central venous catheter tip localizes to the mid/distal right atrial region. If placement at or above the cavoatrial junction is desired, retraction of about 3-4 cm is recommended. No pneumothorax. Shallow inspiration with atelectasis in the lung bases. Borderline heart size. Patchy airspace infiltrates in both lungs could indicate edema, aspiration, or pneumonia. No blunting of costophrenic angles. IMPRESSION: Central venous catheter tip is in the mid/ low right atrial region. If placement at or above the cavoatrial junction is desired, retraction of about 3-4 cm is recommended. Electronically Signed   By: Burman Nieves M.D.   On: 11/10/2016 21:35   Dg Chest Portable 1 View  Result Date: 11/10/2016 CLINICAL DATA:  Status post central line placement. EXAM: PORTABLE CHEST 1 VIEW COMPARISON:  Chest x-ray from earlier same day. FINDINGS: Interval placement of a right IJ central line with tip passing to the left of midline and likely in the right ventricle or main pulmonary artery. The bilateral small nodular airspace opacities throughout both lungs are unchanged in the short-term interval. Suspect small bilateral pleural effusions. No pneumothorax seen. IMPRESSION: 1. Right IJ central line placement with tip positioned to the left of midline either within the right ventricle or main pulmonary artery. Recommend retracting at least 10 cm. 2. Bilateral small nodular airspace opacities throughout both lungs are unchanged in the short-term interval. As recommended on earlier chest x-ray report, would consider chest CT for further characterization. 3. Suspect small bilateral pleural effusions. Electronically Signed   By: Bary Richard M.D.   On: 11/10/2016 18:37    Lab Data:  CBC:  Recent Labs Lab 11/30/16 0440  WBC 7.6  HGB  9.9*  HCT 31.2*  MCV 87.6  PLT 327   Basic Metabolic Panel:  Recent Labs Lab 12/01/16 0349 12/02/16 0432 12/03/16 0428 12/04/16 0446 12/05/16 0446 12/06/16 0410  NA 138  --  138  --   --   --   K 3.4*  --  3.5  --   --   --   CL 104  --  103  --   --   --   CO2 28  --  28  --   --   --   GLUCOSE 95  --  92  --   --   --   BUN 10  --  9  --   --   --   CREATININE 0.68 0.63 0.63 0.65 0.61 0.72  CALCIUM 8.5*  --  8.9  --   --   --    GFR: Estimated Creatinine Clearance: 88.2 mL/min (by C-G formula based on SCr of 0.72 mg/dL). Liver Function Tests: No results for input(s): AST, ALT, ALKPHOS, BILITOT, PROT, ALBUMIN in the last 168 hours. No results for input(s): LIPASE, AMYLASE in the last 168 hours. No results for input(s): AMMONIA in the last 168 hours. Coagulation Profile: No results for input(s): INR, PROTIME in the last 168 hours. Cardiac Enzymes: No results for input(s): CKTOTAL, CKMB, CKMBINDEX, TROPONINI in the last 168 hours. BNP (last 3 results) No results for input(s): PROBNP in the last 8760 hours. HbA1C: No results for input(s): HGBA1C in the last 72 hours. CBG: No results for input(s): GLUCAP in the last 168 hours. Lipid Profile: No results for input(s): CHOL, HDL, LDLCALC, TRIG, CHOLHDL, LDLDIRECT in the last 72 hours. Thyroid Function Tests: No results for input(s): TSH, T4TOTAL, FREET4, T3FREE, THYROIDAB in the last 72 hours. Anemia Panel: No results for input(s): VITAMINB12, FOLATE, FERRITIN, TIBC, IRON, RETICCTPCT in the last 72 hours. Urine analysis:    Component Value Date/Time   COLORURINE YELLOW 11/10/2016 2132   APPEARANCEUR CLEAR 11/10/2016 2132   LABSPEC 1.006 11/10/2016 2132   PHURINE 5.0 11/10/2016 2132   GLUCOSEU NEGATIVE 11/10/2016 2132   HGBUR MODERATE (A) 11/10/2016 2132   BILIRUBINUR NEGATIVE 11/10/2016 2132   KETONESUR NEGATIVE 11/10/2016 2132   PROTEINUR NEGATIVE 11/10/2016 2132   UROBILINOGEN 0.2 08/05/2010 1821   NITRITE  NEGATIVE 11/10/2016 2132   LEUKOCYTESUR SMALL (A) 11/10/2016 2132     Lynden Oxford M.D. [facility] 12/06/2016, 4:56 PM

## 2016-12-06 NOTE — Progress Notes (Signed)
CSW received call from [facility]. They are now not able to accept patient at all for Suboxone due to them not being able to handle patient's needs. CSW to continue to follow.  Osborne Cascoadia Berdella Bacot LCSWA [phone]

## 2016-12-06 NOTE — Progress Notes (Signed)
Pharmacy Antibiotic Note Savannah Palmer is a 26 y.o. 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 Pharmacy has been consulted for Vancomycin dosing.  Patient remains stable on vancomycin with stable renal function. A vancomycin trough checked today is therapeutic at 17 ug/ml.  Plan: Continue Vancomycin to 750mg  IV q12 Repeat vancomycin trough as needed (at least two times weekly while on therapy) or earlier if clinical course dictates  Ok for BMP every 48 - 72 hours from a pharmacy monitoring perspective  Plans for 6 weeks of treatment- through 12/30/2016 Follow dispo planning   Height: 5\' 3"  (160 cm) Weight: 127 lb 1.6 oz (57.7 kg) IBW/kg (Calculated) : 52.4  Temp (24hrs), Avg:97.8 F (36.6 C), Min:97.5 F (36.4 C), Max:98.2 F (36.8 C)   Recent Labs Lab 11/30/16 0440  12/01/16 0859 12/02/16 0432 12/03/16 0428 12/04/16 0446 12/05/16 0446 12/06/16 0410 12/06/16 0939  WBC 7.6  --   --   --   --   --   --   --   --   CREATININE 0.78  < >  --  0.63 0.63 0.65 0.61 0.72  --   VANCOTROUGH  --   --  17  --   --   --   --   --  17  < > = values in this interval not displayed.  Estimated Creatinine Clearance: 88.2 mL/min (by C-G formula based on SCr of 0.72 mg/dL).    No Known Allergies  5/26 Zosyn x 1 5/26 Vanc >> 6/1;   6/8 >> 5/26 Cefepime >> 5/27 5/31 Teflaro >> 6/8 6/2 Daptomycin >> 6/8   5/27: increase vanc to 750 q8 with improved renal function 5/29 1049 VT: 13 (subtherapeutic) on 750mg  q8h prior to 7th dose, inc to 1g q8h 5/31 2030 VT: 19 (therapeutic) - continue 1g q8h 6/11 0600 VT: 38 (accumulated on q8h dose)  VANC HOLD 6/11 VR 19 (t1/2 ~11.5 > reduced to 1000mg  q12) 6/14 VT 23 on 1g q12h > reduced to 750mg  q12h 6/16 VT 17 on 750mg  q12h > no changes  5/26 UCx: NGF 5/26 BCx: 4/4 MRSA 5/26 MRSA PCR: positive  5/27 BCx: 2/2 MRSA  5/28 BCx: 2/2 MRSA 5/28 HIV antibody: non reactive 5/28 HCV RNA:  ND 5/29 BCx: MRSA, BCID = MRSA 5/30 @ 0033 BCx: 1/2 MRSA 5/30 @ 1610 BCx: ngF 6/1 pleural fluid Cx: MRSA 6/4 BCx: ngF  Thank you for allowing pharmacy to be a part of this patient's care.  Lysle Pearlachel Kem Hensen, PharmD, BCPS Pager # [phone] 12/06/2016 1:52 PM

## 2016-12-07 NOTE — Progress Notes (Signed)
CSW consulted with CSW PhotographerDirector and Medical Director. They stated that patient will need to remain here for her IV abx, but then medical team should work on weaning Suboxone since patient will not have access to it after discharge. Patient reported that she will work on calling clinics that she has been to before.   Osborne Cascoadia Kieara Schwark LCSWA [phone]

## 2016-12-07 NOTE — Progress Notes (Signed)
[facility] PROGRESS NOTE  Patient: Savannah NoeVictoria L XXXCrews JYN:[id number]:[id number]   PCP: Patient, No Pcp Per DOB: [birth year]   DOA: 11/10/2016   DOS: 12/07/2016    Subjective: Denies any acute complaint.  Objective:  Vitals:   12/07/16 0523 12/07/16 1525  BP: 97/66 113/68  Pulse: 91 90  Resp: 18   Temp: 97.6 F (36.4 C) 98.1 F (36.7 C)    Bilateral breath sounds present. Clear to auscultation. S1 and S2 present.  Bowel sounds present. No edema.  Assessment and plan: MRSA bacteremia, infective endocarditis, MRSA empyema. Continue IV vancomycin per pharmacy. Weekly CBC, BMP as well as vancomycin levels. Pharmacy monitoring the medication at present.  Anxiety. We'll slowly taper of the Xanax. Continue BuSpar and gabapentin.  Pain control. Patient has been on Suboxone chronically for almost a year or even longer. Will need outpatient Suboxone clinic to continue to prescribe this medication or with having to go to a rehabilitation place the slowly been off pain medications.  Author: Lynden OxfordPranav Yatzil Clippinger, MD [facility] Pager: [phone] 12/07/2016 7:16 PM   If 7PM-7AM, please contact night-coverage at [email or web address], password [email or web address]

## 2016-12-08 NOTE — Progress Notes (Signed)
[facility] PROGRESS NOTE  Patient: Savannah NoeVictoria L XXXCrews WUJ:[id number]:[id number]   PCP: Patient, No Pcp Per DOB: 07/30/1990   DOA: 11/10/2016   DOS: 12/08/2016    Subjective: No acute complaint. Anxiety.  Objective:  Vitals:   12/08/16 0513 12/08/16 1435  BP: 96/67 119/80  Pulse: 89 95  Resp: 16 15  Temp: 97.6 F (36.4 C) 97.9 F (36.6 C)    Bilateral breath sounds present. Clear to auscultation. S1 and S2 present.  Bowel sounds present. No edema.  Assessment and plan: MRSA bacteremia, infective endocarditis, MRSA empyema. Continue IV vancomycin per pharmacy. Weekly CBC, BMP as well as vancomycin levels. Pharmacy monitoring the medication at present.  Anxiety. We'll taper of the Xanax.Starting once a day regimen tonight. Continue BuSpar and gabapentin.  Pain control. Patient has been on Suboxone chronically for almost a year or even longer. Will need outpatient Suboxone clinic to continue to prescribe this medication or have to go to a rehabilitation place where she can slowly weaned off of pain medications.  Author: Lynden OxfordPranav Dedrick Heffner, MD [facility] Pager: [phone] 12/08/2016 6:46 PM   If 7PM-7AM, please contact night-coverage at [email or web address], password [email or web address]

## 2016-12-09 LAB — CREATININE, SERUM
Creatinine, Ser: 0.81 mg/dL (ref 0.44–1.00)
GFR calc Af Amer: >60 mL/min (ref >60)
GFR calc non Af Amer: >60 mL/min (ref >60)

## 2016-12-09 NOTE — Progress Notes (Signed)
[facility] PROGRESS NOTE  Patient: Savannah NoeVictoria L XXXCrews JXB:[id number]:[id number]   PCP: Patient, No Pcp Per DOB: 02-20-1991   DOA: 11/10/2016   DOS: 12/09/2016    Subjective: No acute event and acute complaint.  Objective:  Vitals:   12/09/16 0535 12/09/16 1559  BP: 96/65 106/67  Pulse: 90 83  Resp: 18 16  Temp: 98.8 F (37.1 C) 97.9 F (36.6 C)    Bilateral breath sounds present. Clear to auscultation. S1 and S2 present.  No edema.  Assessment and plan: MRSA bacteremia, infective endocarditis, MRSA empyema. Continue IV vancomycin per pharmacy. Weekly CBC, BMP as well as vancomycin levels. Pharmacy monitoring the medication at present.  Anxiety. We'll taper of the Xanax. Continue BuSpar and gabapentin.  Pain control. Patient has been on Suboxone chronically for almost a year or even longer. Will need outpatient Suboxone clinic to continue to prescribe this medication or have to go to a rehabilitation place where she can slowly weaned off of pain medications.  Author: Lynden OxfordPranav Tacarra Justo, MD [facility] Pager: [phone] 12/09/2016 5:22 PM   If 7PM-7AM, please contact night-coverage at [email or web address], password [email or web address]

## 2016-12-10 LAB — BASIC METABOLIC PANEL
ANION GAP: 8 (ref 5–15)
BUN: 16 mg/dL (ref 6–20)
CALCIUM: 9.8 mg/dL (ref 8.9–10.3)
CHLORIDE: 99 mmol/L — AB (ref 101–111)
CO2: 28 mmol/L (ref 22–32)
Creatinine, Ser: 0.95 mg/dL (ref 0.44–1.00)
GFR calc Af Amer: >60 mL/min (ref >60)
GFR calc non Af Amer: >60 mL/min (ref >60)
GLUCOSE: 90 mg/dL (ref 65–99)
Potassium: 4.3 mmol/L (ref 3.5–5.1)
Sodium: 135 mmol/L (ref 135–145)

## 2016-12-10 LAB — CBC
HEMATOCRIT: 35.2 % — AB (ref 36.0–46.0)
HEMOGLOBIN: 11.4 g/dL — AB (ref 12.0–15.0)
MCH: 28.8 pg (ref 26.0–34.0)
MCHC: 32.4 g/dL (ref 30.0–36.0)
MCV: 88.9 fL (ref 78.0–100.0)
Platelets: 262 10*3/uL (ref 150–400)
RBC: 3.96 MIL/uL (ref 3.87–5.11)
RDW: 16 % — AB (ref 11.5–15.5)
WBC: 6.2 10*3/uL (ref 4.0–10.5)

## 2016-12-10 LAB — VANCOMYCIN, TROUGH: Vancomycin Tr: 21 ug/mL (ref 15–20)

## 2016-12-10 MED ORDER — VANCOMYCIN HCL 500 MG IV SOLR
500.0000 mg | Freq: Two times a day (BID) | INTRAVENOUS | Status: DC
  Administered 2016-12-10 – 2016-12-16 (×13): 500 mg via INTRAVENOUS
  Filled 2016-12-10 (×14): qty 500

## 2016-12-10 NOTE — Progress Notes (Signed)
Pharmacy Antibiotic Note Savannah Palmer is a 26 y.o. 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 Pharmacy has been consulted for Vancomycin dosing.   Vanc trough level up to 21 mcg/ml on Vanc 750 mg IV q12h.  Goal 15-20 mcg/ml.  Last trough level 17 mcg/ml on same dose on 12/06/16.  BUN/creatinine have trended up some:  9/0.63 > 16/0.95.  RN reports good PO intake, good UOP.  Plan: Decrease Vancomycin to 500 mg IV q12hrs. Repeat vancomycin trough at steady state. Vanc troughs at least twice a week while on Vancomycin. Bmet every 2-3 days.  Plans for 6 weeks of treatment- through 12/30/2016   Height: 5\' 3"  (160 cm) Weight: 118 lb 9.7 oz (53.8 kg) IBW/kg (Calculated) : 52.4  Temp (24hrs), Avg:97.7 F (36.5 C), Min:97.5 F (36.4 C), Max:97.9 F (36.6 C)   Recent Labs Lab 12/04/16 0446 12/05/16 0446 12/06/16 0410 12/06/16 0939 12/09/16 0419 12/10/16 0903  WBC  --   --   --   --   --  6.2  CREATININE 0.65 0.61 0.72  --  0.81 0.95  VANCOTROUGH  --   --   --  17  --  21*    Estimated Creatinine Clearance: 74.2 mL/min (by C-G formula based on SCr of 0.95 mg/dL).    No Known Allergies  5/26 Zosyn x 1 5/26 Vanc >> 6/1;   6/8 >> 5/26 Cefepime >> 5/27 5/31 Teflaro >> 6/8 6/2 Daptomycin >> 6/8   5/27: increase vanc to 750 q8 with improved renal function 5/29  VT: 13 (subtherapeutic) on 750mg  q8h prior to 7th dose, inc to 1g q8h 5/31  VT: 19 (therapeutic) - continue 1g q8h 6/11  VT: 38 (accumulated on q8h dose)  VANC HOLD 6/11 VR 19 (t1/2 ~11.5 > reduced to 1000mg  q12) 6/14 VT 23 on 1g q12h > reduced to 750mg  q12h 6/16 VT 17 on 750mg  q12h > no changes 6/22: VT 17 on 750 mg q12h 6/25: VT 21(slightly supratherapeutic) on 750 mg q12h > reduced to 500 mg q12h  5/26 UCx: NGF 5/26 BCx: 4/4 MRSA 5/26 MRSA PCR: positive  5/27 BCx: 2/2 MRSA  5/28 BCx: 2/2 MRSA 5/28 HIV antibody: non reactive 5/28 HCV RNA: ND 5/29  BCx: MRSA, BCID = MRSA 5/30 @ 0033 BCx: 1/2 MRSA 5/30 @ 1610 BCx: ngF 6/1 pleural fluid Cx: MRSA 6/4 BCx: ngF  Thank you for allowing pharmacy to be a part of this patient's care.  Dennie Fettersgan, Savannah Palmer , [place] Pager: [phone]  12/10/2016 11:31 AM

## 2016-12-10 NOTE — Progress Notes (Signed)
[facility] PROGRESS NOTE  Patient: Savannah Palmer JYN:[id number]:[id number]   PCP: Patient, No Pcp Per DOB: 03/14/1991   DOA: 11/10/2016   DOS: 12/10/2016    Subjective: No acute event and acute complaint.  Objective:  Vitals:   12/09/16 2131 12/10/16 0549  BP: 111/71 101/69  Pulse: 97 78  Resp: 17 17  Temp: 97.7 F (36.5 C) 97.5 F (36.4 C)    Bilateral breath sounds present. Clear to auscultation. S1 and S2 present.  No edema.  Assessment and plan: MRSA bacteremia, infective endocarditis, MRSA empyema. Continue IV vancomycin per pharmacy. Weekly CBC, BMP as well as vancomycin levels. Pharmacy monitoring the medication at present.  Anxiety. Continue BuSpar and gabapentin. Xanax has been tapered off.  Pain control. Patient has been on Suboxone chronically for almost a year or even longer. Will need outpatient Suboxone clinic to continue to prescribe this medication or have to go to a rehabilitation place where she can slowly weaned off of pain medications.  Author: Lynden OxfordPranav Keyonta Madrid, MD [facility] Pager: [phone] 12/10/2016 5:19 PM   If 7PM-7AM, please contact night-coverage at [email or web address], password [email or web address]

## 2016-12-11 MED ORDER — FUROSEMIDE 20 MG PO TABS
20.0000 mg | ORAL_TABLET | ORAL | Status: DC
  Administered 2016-12-11 – 2016-12-29 (×9): 20 mg via ORAL
  Filled 2016-12-11 (×9): qty 1

## 2016-12-11 NOTE — Progress Notes (Signed)
[facility] Progress Note  Patient: Savannah Palmer ZOX:[id number]   PCP: Patient, No Pcp Per DOB: 07-27-1990   DOA: 11/10/2016   DOS: 12/11/2016   Date of Service: the patient was seen and examined on 12/11/2016  Subjective: Feels that she is starting to gaining fluid. Has minimal anxiety. No chest pain and abdominal pain no nausea no vomiting. Tolerating oral diet. Working on finding herself Suboxone clinic.  Brief hospital course: 26 year old Caucasian female with a past medical history of anxiety, depression, heroin abuse, presented with weakness, cough and chest pain.  Initially was in septic shock and required pressors. Blood cultures grew MRSA.  She has a history of tricuspid valve endocarditis and was found to have septic emboli throughout her lungs. She also developed a spontaneous right-sided pneumothorax. Chest tube was placed by critical care medicine.  She was transferred to [facility] for opinion from cardiothoracic surgery. Patient is not a candidate for surgery at this time. PTX, resolved, remains stable, inpatient on IV Vanc and Po suboxone till 7/15. Patient is working on to find out Suboxone clinic for herself. Until then patient will finish her IV antibiotics in the hospital.  Assessment and Plan: MRSA bacteremia/Septic shock with right TV endocarditis and septic emboli to lungs/MRSA empyema - Patient presented with weakness, coughing, chest pain and septic shock requiring vasopressors. Blood cultures 5/27 grew out MRSA and was found to have septic emboli throughout her lungs.  -Repeat cultures were still positive, blood cultures repeat on 6/4 has been negative to date  - 2-D echo on 5/27 showed tricuspid vegetations with moderate TR. TEE on 6/1 showed large tricuspid vegetation, severe TR with dilated RV along with a loculated left effusion - HIV, Hep C negative, RPR was done and it was negative. - Due to persistent bacteremia patient was started on  daptomycin 6/2 and Ceftaroline was continued to cover lung infection. Per ID recommendations, Ceftaroline and daptomycin have been discontinued. - cardiothoracic surgery was also consulted and patient was deemed NOT a candidate for surgery at this time.  - on vancomycin duration through July 15, will need BMP, CBC weekly, trough goal of 15-20.  - PICC line placed on 6/9, continue vancomycin, until July 15 -remains stable   Right spontaneous pneumothorax- acute respiratory failure/MRSA Empyema -Right empyema with pneumothorax, positive for MRSA. Status post chest tube. - Chest tube removed on 6/7, chest x-ray clear  -resolved  Anxiety - Has significant anxiety and per family primary reason for patient's recurrent relapse with drug abuse. Patient was started on Xanax in May, also was started on BuSpar and gabapentin. Celexa was attempted but discontinued per patient request.  One of the Suboxone clinic that was arranged for the patient recommended not to use benzodiazepine along with Suboxone.  After a prolonged discussion with patient as well as her mother and father, it was decided that we will discontinue Xanax with a slow taper. Patient completed the taper on 12/10/2016 and currently off of Xanax. Would request not to restart any benzodiazepine again unless indicated. The discontinuation of the Xanax has been supplemented by increasing the dose of the BuSpar as well as increasing the dose of the Neurontin. Patient refused to take any medication in SSRI TCA or  SNRI group. No active seizures at present, no withdrawal symptoms.  Acute kidney injury -Baseline creatinine 0.7, creatinine increased to 1.37, likely secondary to dehydration and NSAIDs. - patient received IV fluids, NSAIDs were discontinued, and creatinine function has normalized.   RUQ pain,  likely musculoskeletal -CT abdomen and pelvis showed no liver or gallbladder abnormalities, possible musculoskeletal origin, currently  stable.    Ascites, anasarca due to right sided hear failure - as a result of severe TR, - O2 sats 96% on room air -on low dose lasix for edema  Splenomegaly - etiology undetermined- CT on 5/30 revealed that the spleen is enlarged measuring 15.7 x 5.6 x 17 cm (volume= 780 cm^3) - liver is unremarkable on CT  Normocytic Anemia - H&H currently stable, was transfused on 6/5, no overt bleeding noted - Iron was 12. Ferritin 213. B-12 1850. Folate 7.8. - Hemoglobin down to 7.1 on 6/10, patient was transfused 2 units packed RBC with 1 dose of IV Feraheme, currently stable  Hypokalemia/Hypophosphatemia/Hypomagnesemia Resolved  Thrombocytopenia - Resolved  Heroin abuse - cont Suboxone, will need transition to a drug rehab program once stable.  Nicotine dependence -Continue Nicoderm patch  Diet: Regular diet DVT Prophylaxis: subcutaneous Heparin  Advance goals of care discussion: Full code  Family Communication: no family was present at bedside, at the time of interview.   Disposition:  Discharge to be determined. .  Consultants: Infectious disease, cardiac thoracic surgery, CCM Procedures: TEE, right chest tube placement, repeat chest tube placement on 63, PICC line placement on 69  Antibiotics: Anti-infectives    Start     Dose/Rate Route Frequency Ordered Stop   12/10/16 1700  vancomycin (VANCOCIN) 500 mg in sodium chloride 0.9 % 100 mL IVPB     500 mg 100 mL/hr over 60 Minutes Intravenous Every 12 hours 12/10/16 1128     11/29/16 1000  vancomycin (VANCOCIN) IVPB 750 mg/150 ml premix  Status:  Discontinued     750 mg 150 mL/hr over 60 Minutes Intravenous Every 12 hours 11/29/16 0934 12/10/16 1128   11/26/16 2000  vancomycin (VANCOCIN) IVPB 1000 mg/200 mL premix  Status:  Discontinued     1,000 mg 200 mL/hr over 60 Minutes Intravenous Every 12 hours 11/26/16 1912 11/29/16 0853   11/24/16 0000  vancomycin (VANCOCIN) IVPB 1000 mg/200 mL premix  Status:  Discontinued      1,000 mg 200 mL/hr over 60 Minutes Intravenous Every 8 hours 11/23/16 1238 11/26/16 0721   11/23/16 1600  vancomycin (VANCOCIN) 1,500 mg in sodium chloride 0.9 % 500 mL IVPB     1,500 mg 250 mL/hr over 120 Minutes Intravenous  Once 11/23/16 1238 11/23/16 1733   11/17/16 1600  DAPTOmycin (CUBICIN) 500 mg in sodium chloride 0.9 % IVPB  Status:  Discontinued     500 mg 220 mL/hr over 30 Minutes Intravenous Every 24 hours 11/17/16 1432 11/23/16 1238   11/17/16 1500  ceftaroline (TEFLARO) 600 mg in sodium chloride 0.9 % 250 mL IVPB  Status:  Discontinued     600 mg 250 mL/hr over 60 Minutes Intravenous Every 12 hours 11/17/16 1435 11/23/16 1238   11/15/16 1800  ceftaroline (TEFLARO) 600 mg in sodium chloride 0.9 % 250 mL IVPB  Status:  Discontinued     600 mg 250 mL/hr over 60 Minutes Intravenous Every 12 hours 11/15/16 1635 11/17/16 1431   11/13/16 1215  vancomycin (VANCOCIN) IVPB 1000 mg/200 mL premix  Status:  Discontinued     1,000 mg 200 mL/hr over 60 Minutes Intravenous Every 8 hours 11/13/16 1203 11/16/16 0850   11/11/16 1100  vancomycin (VANCOCIN) IVPB 750 mg/150 ml premix  Status:  Discontinued     750 mg 150 mL/hr over 60 Minutes Intravenous Every 8 hours 11/11/16 0956 11/13/16 1203  11/11/16 0400  vancomycin (VANCOCIN) 500 mg in sodium chloride 0.9 % 100 mL IVPB  Status:  Discontinued     500 mg 100 mL/hr over 60 Minutes Intravenous Every 12 hours 11/10/16 1951 11/11/16 0956   11/10/16 2200  ceFEPIme (MAXIPIME) 1 g in dextrose 5 % 50 mL IVPB  Status:  Discontinued     1 g 100 mL/hr over 30 Minutes Intravenous Every 12 hours 11/10/16 1951 11/11/16 0926   11/10/16 2000  ceFEPIme (MAXIPIME) 2 g in dextrose 5 % 50 mL IVPB  Status:  Discontinued     2 g 100 mL/hr over 30 Minutes Intravenous  Once 11/10/16 1952 11/10/16 1954   11/10/16 2000  vancomycin (VANCOCIN) IVPB 1000 mg/200 mL premix  Status:  Discontinued     1,000 mg 200 mL/hr over 60 Minutes Intravenous  Once 11/10/16  1952 11/10/16 1954   11/10/16 1430  piperacillin-tazobactam (ZOSYN) IVPB 3.375 g     3.375 g 100 mL/hr over 30 Minutes Intravenous  Once 11/10/16 1426 11/10/16 1503   11/10/16 1430  vancomycin (VANCOCIN) IVPB 1000 mg/200 mL premix     1,000 mg 200 mL/hr over 60 Minutes Intravenous  Once 11/10/16 1426 11/10/16 1653       Objective: Physical Exam: Vitals:   12/10/16 1854 12/10/16 2149 12/11/16 0542 12/11/16 1526  BP: 100/68 104/71 109/75 100/65  Pulse: 91 82 86 92  Resp: 18  18 18   Temp: 98.5 F (36.9 C) 97.6 F (36.4 C) 98 F (36.7 C) 98.2 F (36.8 C)  TempSrc:   Oral Oral  SpO2: 98% 99% 98% 98%  Weight:   56.8 kg (125 lb 4.8 oz)   Height:        Intake/Output Summary (Last 24 hours) at 12/11/16 2019 Last data filed at 12/11/16 1714  Gross per 24 hour  Intake              250 ml  Output                0 ml  Net              250 ml   Filed Weights   12/08/16 0058 12/09/16 0535 12/11/16 0542  Weight: 58.1 kg (128 lb) 53.8 kg (118 lb 9.7 oz) 56.8 kg (125 lb 4.8 oz)   General: Alert, Awake and Oriented to Time, Place and Person. Appear in no distress, affect appropriate Eyes: PERRL, Conjunctiva normal ENT: Oral Mucosa clear moist. Neck: no JVD, no Abnormal Mass Or lumps Cardiovascular: S1 and S2 Present, aortic systolic Murmur, Peripheral Pulses Present Respiratory: normal respiratory effort, Bilateral Air entry equal and Decreased, no use of accessory muscle, Clear to Auscultation, no Crackles, no wheezes Abdomen: Bowel Sound present, Soft and no tenderness, no hernia Skin: no redness, no Rash, no induration Extremities: no Pedal edema, no calf tenderness Neurologic: Grossly no focal neuro deficit. Bilaterally Equal motor strength  Data Reviewed: CBC:  Recent Labs Lab 12/10/16 0903  WBC 6.2  HGB 11.4*  HCT 35.2*  MCV 88.9  PLT 262   Basic Metabolic Panel:  Recent Labs Lab 12/05/16 0446 12/06/16 0410 12/09/16 0419 12/10/16 0903  NA  --   --   --  135    K  --   --   --  4.3  CL  --   --   --  99*  CO2  --   --   --  28  GLUCOSE  --   --   --  90  BUN  --   --   --  16  CREATININE 0.61 0.72 0.81 0.95  CALCIUM  --   --   --  9.8    Liver Function Tests: No results for input(s): AST, ALT, ALKPHOS, BILITOT, PROT, ALBUMIN in the last 168 hours. No results for input(s): LIPASE, AMYLASE in the last 168 hours. No results for input(s): AMMONIA in the last 168 hours. Coagulation Profile: No results for input(s): INR, PROTIME in the last 168 hours. Cardiac Enzymes: No results for input(s): CKTOTAL, CKMB, CKMBINDEX, TROPONINI in the last 168 hours. BNP (last 3 results) No results for input(s): PROBNP in the last 8760 hours. CBG: No results for input(s): GLUCAP in the last 168 hours. Studies: No results found.  Scheduled Meds: . buprenorphine-naloxone  2 tablet Sublingual Daily  . busPIRone  10 mg Oral TID  . furosemide  20 mg Oral Once per day on Tue Thu Sat  . gabapentin  400 mg Oral QID  . heparin  5,000 Units Subcutaneous Q8H  . nicotine  14 mg Transdermal Daily  . potassium chloride  40 mEq Oral Daily  . saccharomyces boulardii  250 mg Oral BID   Continuous Infusions: . sodium chloride 250 mL (11/28/16 2032)  . vancomycin Stopped (12/11/16 1809)   PRN Meds: sodium chloride, acetaminophen, albuterol, docusate sodium, ibuprofen, nicotine polacrilex, ondansetron (ZOFRAN) IV, oxyCODONE, sodium chloride flush  Time spent: 30 minutes  Author: Lynden Oxford, MD [facility] Pager: [phone] 12/11/2016 8:19 PM  If 7PM-7AM, please contact night-coverage at [email or web address], password [email or web address]

## 2016-12-12 DIAGNOSIS — F1110 Opioid abuse, uncomplicated: Secondary | ICD-10-CM

## 2016-12-12 LAB — CREATININE, SERUM
Creatinine, Ser: 0.95 mg/dL (ref 0.44–1.00)
GFR calc Af Amer: >60 mL/min (ref >60)

## 2016-12-12 NOTE — Progress Notes (Signed)
[facility] PROGRESS NOTE  Savannah Palmer:[id number] DOB: [birth year] DOA: 11/10/2016  PCP: Patient, No Pcp Per  Brief History/Interval Summary: 26 year old Caucasian female with a past medical history of anxiety, depression, Heroin abuse, presented with weakness, cough and chest pain. Patient was initially in septic shock and required pressors. Blood cultures were positive for MRSA. Patient has a history of tricuspid valve endocarditis and was found to have septic emboli throughout her lungs. She also developed spontaneous right-sided pneumothorax requiring chest tube placement. Patient was also seen by cardiothoracic surgery and was not thought to be a candidate for surgical intervention for her endocarditis.  Reason for Visit: Tricuspid valve endocarditis  Consultants: Infectious disease. Cardiothoracic surgery. Pulmonology  Procedures:  TEE. Chest tube placement. PICC line placement.  Antibiotics: Currently on vancomycin alone.  She was on Teflaro and daptomycin previously.  Subjective/Interval History: Patient feels well. Denies any chest pain or shortness of breath. Somewhat anxious.  ROS: Denies any nausea or vomiting  Objective:  Vital Signs  Vitals:   12/11/16 0542 12/11/16 1526 12/11/16 2118 12/12/16 0518  BP: 109/75 100/65 115/71 107/72  Pulse: 86 92 93 92  Resp: 18 18 18 20   Temp: 98 F (36.7 C) 98.2 F (36.8 C) 98.4 F (36.9 C) 97.7 F (36.5 C)  TempSrc: Oral Oral Oral Oral  SpO2: 98% 98% 100% 95%  Weight: 56.8 kg (125 lb 4.8 oz)   56.2 kg (123 lb 12.8 oz)  Height:        Intake/Output Summary (Last 24 hours) at 12/12/16 1359 Last data filed at 12/12/16 0525  Gross per 24 hour  Intake              130 ml  Output                0 ml  Net              130 ml   Filed Weights   12/09/16 0535 12/11/16 0542 12/12/16 0518  Weight: 53.8 kg (118 lb 9.7 oz) 56.8 kg (125 lb 4.8 oz) 56.2 kg (123 lb 12.8 oz)    General appearance: alert,  cooperative, appears stated age and no distress Head: Normocephalic, without obvious abnormality, atraumatic Resp: clear to auscultation bilaterally Cardio: regular rate and rhythm, S1, S2 normal, no murmur, click, rub or gallop GI: soft, non-tender; bowel sounds normal; no masses,  no organomegaly Extremities: extremities normal, atraumatic, no cyanosis or edema Neurologic: No focal deficits  Lab Results:  Data Reviewed: I have personally reviewed following labs and imaging studies  CBC:  Recent Labs Lab 12/10/16 0903  WBC 6.2  HGB 11.4*  HCT 35.2*  MCV 88.9  PLT 262    Basic Metabolic Panel:  Recent Labs Lab 12/06/16 0410 12/09/16 0419 12/10/16 0903 12/12/16 0520  NA  --   --  135  --   K  --   --  4.3  --   CL  --   --  99*  --   CO2  --   --  28  --   GLUCOSE  --   --  90  --   BUN  --   --  16  --   CREATININE 0.72 0.81 0.95 0.95  CALCIUM  --   --  9.8  --     GFR: Estimated Creatinine Clearance: 74.2 mL/min (by C-G formula based on SCr of 0.95 mg/dL).    Radiology Studies: No results found.   Medications:  Scheduled: . buprenorphine-naloxone  2 tablet Sublingual Daily  . busPIRone  10 mg Oral TID  . furosemide  20 mg Oral Once per day on Tue Thu Sat  . gabapentin  400 mg Oral QID  . heparin  5,000 Units Subcutaneous Q8H  . nicotine  14 mg Transdermal Daily  . potassium chloride  40 mEq Oral Daily  . saccharomyces boulardii  250 mg Oral BID   Continuous: . sodium chloride 250 mL (11/28/16 2032)  . vancomycin Stopped (12/12/16 0558)   Palmer:WRUEAVPRN:sodium chloride, acetaminophen, albuterol, docusate sodium, ibuprofen, nicotine polacrilex, ondansetron (ZOFRAN) IV, oxyCODONE, sodium chloride flush  Assessment/Plan:  Principal Problem:   Bacterial endocarditis Active Problems:   IVDU (intravenous drug user)   Polysubstance (including opioids) dependence, daily use (HCC)   Septic pulmonary embolism (HCC)   Septic shock (HCC)   Encounter for central  line placement   Respiratory failure (HCC)   MRSA bacteremia   Hypokalemia   Hypophosphatemia   Endocarditis   Thrombocytopenia (HCC)   RUQ pain   Right hip pain   Pneumothorax on right   Tricuspid valve regurgitation, infectious   Pleural effusion on right    MRSA bacteremia/septic shock/tricuspid valve endocarditis/septic emboli to lungs/MRSA empyema Patient has been stable for the past many days. She initially required pressors due to septic shock. Echocardiogram showed tricuspid valve vegetations with moderate TR. She also underwent TEE. HIV, hepatitis C and RPR were negative. Patient was seen by infectious disease and cardiothoracic surgery. Patient was initially placed on vancomycin. Due to persistent bacteremia she was started on daptomycin and ceftazidime. These have been discontinued. Only on vancomycin for now. PICC line was placed on 6/9. Vancomycin to continue until July 15. Patient will remain inpatient until then.  Right spontaneous pneumothorax Seen by pulmonology as well as cardiothoracic. Chest tube was placed. Now removed. Patient is stable.  Anxiety Patient has significant anxiety and per family is primary reason for patient's relapse with drug abuse. Patient was started on Xanax in May and on BuSpar and gabapentin. Celexa was attempted, but discontinued per patient request. The Suboxone clinic has recommended no benzodiazepine. She was tapered off of Xanax.  Acute kidney injury Baseline creatinine was 0.7. Increased to 1.37. Thought to be secondary to dehydration and NSAIDs. Now improved.  Right upper quadrant abdominal pain.  CT scan of the abdomen pelvis did not show any acute abnormalities. Pain thought to be secondary to musculoskeletal in origin. Now resolved.  Ascites/anasarca due to right heart failure. This is thought to be a result of severe TR. Patient was placed on Lasix with improvement.  Splenomegaly. Detected on CT scan. Will need continued  monitoring as outpatient.  Normocytic anemia  Hemoglobin stable. Last transfused on 6/5.  Hypokalemia/hypophosphatemia/hypomagnesemia. Resolved.  Thrombocytopenia. Resolved  Heroin abuse Continue Suboxone. Will transition to a drug rehabilitation program once stable.  Tobacco abuse Continue nicotine patch  DVT Prophylaxis: Subcutaneous heparin    Code Status: Full code  Family Communication: Discussed with the patient  Disposition Plan: Continue management as outlined above.    LOS: 32 days   [facility] Brownsboro HospitalKRISHNAN,Saige Busby  [facility] Pager [phone] 12/12/2016, 1:59 PM  If 7PM-7AM, please contact night-coverage at [email or web address], password [email or web address]

## 2016-12-13 LAB — VANCOMYCIN, TROUGH: Vancomycin Tr: 15 ug/mL (ref 15–20)

## 2016-12-13 NOTE — Progress Notes (Signed)
Pharmacy Antibiotic Note Savannah Palmer is a 26 y.o. 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 Pharmacy has been consulted for Vancomycin dosing.   Vanc trough level 15 mcg/ml on Vanc 500 mg IV q12h.  Goal 15-20 mcg/ml.   Plan: Continue Vancomycin 500 mg IV q12hrs. Vanc troughs at least twice a week while on Vancomycin - next 7/1 Bmet every 2-3 days.  Plans for 6 weeks of treatment- through 12/30/2016   Height: 5\' 3"  (160 cm) Weight: 126 lb 1.7 oz (57.2 kg) IBW/kg (Calculated) : 52.4  Temp (24hrs), Avg:98.4 F (36.9 C), Min:98.3 F (36.8 C), Max:98.6 F (37 C)   Recent Labs Lab 12/09/16 0419 12/10/16 0903 12/12/16 0520 12/13/16 0513  WBC  --  6.2  --   --   CREATININE 0.81 0.95 0.95  --   VANCOTROUGH  --  21*  --  15    Estimated Creatinine Clearance: 74.2 mL/min (by C-G formula based on SCr of 0.95 mg/dL).    No Known Allergies  5/26 Zosyn x 1 5/26 Vanc >> 6/1;   6/8 >> 5/26 Cefepime >> 5/27 5/31 Teflaro >> 6/8 6/2 Daptomycin >> 6/8   5/27: increase vanc to 750 q8 with improved renal function 5/29  VT: 13 (subtherapeutic) on 750mg  q8h prior to 7th dose, inc to 1g q8h 5/31  VT: 19 (therapeutic) - continue 1g q8h 6/11  VT: 38 (accumulated on q8h dose)  VANC HOLD 6/11 VR 19 (t1/2 ~11.5 > reduced to 1000mg  q12) 6/14 VT 23 on 1g q12h > reduced to 750mg  q12h 6/16 VT 17 on 750mg  q12h > no changes 6/21 VT 17 on 750mg  q12H 6/25 VT 21 on 750mg  IV q12h > decr to 500 mg IV q12h 6/28 VT 15 on 500mg  IV q12h > no changes  5/26 UCx: NGF 5/26 BCx: 4/4 MRSA 5/26 MRSA PCR: positive  5/27 BCx: 2/2 MRSA  5/28 BCx: 2/2 MRSA 5/28 HIV antibody: non reactive 5/28 HCV RNA: ND 5/29 BCx: MRSA, BCID = MRSA 5/30 @ 0033 BCx: 1/2 MRSA 5/30 @ 1610 BCx: ngF 6/1 pleural fluid Cx: MRSA 6/4 BCx: ngF  Thank you for allowing pharmacy to be a part of this patient's care.  Toys 'R' UsKimberly Tallen Schnorr, Pharm.D., BCPS Clinical  Pharmacist Pager: [phone] Clinical phone for 12/13/2016 from 8:30-4:00 is [phone]. After 4pm, please call Main Rx [phone]) for assistance. 12/13/2016 1:16 PM

## 2016-12-13 NOTE — Progress Notes (Signed)
[facility] PROGRESS NOTE  Savannah NoeVictoria L XXXCrews ZOX:[id number] DOB: 01/09/1991 DOA: 11/10/2016  PCP: Patient, No Pcp Per  Brief History/Interval Summary: 26 year old Caucasian female with a past medical history of anxiety, depression, Heroin abuse, presented with weakness, cough and chest pain. Patient was initially in septic shock and required pressors. Blood cultures were positive for MRSA. Patient has a history of tricuspid valve endocarditis and was found to have septic emboli throughout her lungs. She also developed spontaneous right-sided pneumothorax requiring chest tube placement. Patient was also seen by cardiothoracic surgery and was not thought to be a candidate for surgical intervention for her endocarditis.  Reason for Visit: Tricuspid valve endocarditis  Consultants: Infectious disease. Cardiothoracic surgery. Pulmonology  Procedures:  TEE. Chest tube placement. PICC line placement.  Antibiotics: Currently on vancomycin alone.  She was on Teflaro and daptomycin previously.  Subjective/Interval History: Patient feels well. No complaints offered.   Objective:  Vital Signs  Vitals:   12/12/16 0518 12/12/16 1622 12/12/16 2153 12/13/16 0734  BP: 107/72 101/60 110/64 (!) 111/52  Pulse: 92 92 81 90  Resp: 20 20 18 18   Temp: 97.7 F (36.5 C) 98.4 F (36.9 C) 98.3 F (36.8 C) 98.6 F (37 C)  TempSrc: Oral     SpO2: 95% 97% 97% 100%  Weight: 56.2 kg (123 lb 12.8 oz)   57.2 kg (126 lb 1.7 oz)  Height:        Intake/Output Summary (Last 24 hours) at 12/13/16 0938 Last data filed at 12/12/16 1913  Gross per 24 hour  Intake           599.83 ml  Output                0 ml  Net           599.83 ml   Filed Weights   12/11/16 0542 12/12/16 0518 12/13/16 0734  Weight: 56.8 kg (125 lb 4.8 oz) 56.2 kg (123 lb 12.8 oz) 57.2 kg (126 lb 1.7 oz)    General appearance: Awake, alert. No distress. Resp: Clear to auscultation bilaterally. No wheezing, rales or  rhonchi Cardio: S1, S2 is normal, regular. No S3, S4. No rubs, murmurs, or bruit GI: Abdomen is soft. Nontender, nondistended. Bowel sounds present. No masses, organomegaly Extremities: No edema Neurologic: No focal deficit  Lab Results:  Data Reviewed: I have personally reviewed following labs and imaging studies  CBC:  Recent Labs Lab 12/10/16 0903  WBC 6.2  HGB 11.4*  HCT 35.2*  MCV 88.9  PLT 262    Basic Metabolic Panel:  Recent Labs Lab 12/09/16 0419 12/10/16 0903 12/12/16 0520  NA  --  135  --   K  --  4.3  --   CL  --  99*  --   CO2  --  28  --   GLUCOSE  --  90  --   BUN  --  16  --   CREATININE 0.81 0.95 0.95  CALCIUM  --  9.8  --     GFR: Estimated Creatinine Clearance: 74.2 mL/min (by C-G formula based on SCr of 0.95 mg/dL).    Radiology Studies: No results found.   Medications:  Scheduled: . buprenorphine-naloxone  2 tablet Sublingual Daily  . busPIRone  10 mg Oral TID  . furosemide  20 mg Oral Once per day on Tue Thu Sat  . gabapentin  400 mg Oral QID  . heparin  5,000 Units Subcutaneous Q8H  . nicotine  14 mg Transdermal Daily  . potassium chloride  40 mEq Oral Daily  . saccharomyces boulardii  250 mg Oral BID   Continuous: . sodium chloride 250 mL (11/28/16 2032)  . vancomycin Stopped (12/13/16 0750)   WUJ:WJXBJY chloride, acetaminophen, albuterol, docusate sodium, ibuprofen, nicotine polacrilex, ondansetron (ZOFRAN) IV, oxyCODONE, sodium chloride flush  Assessment/Plan:  Principal Problem:   Bacterial endocarditis Active Problems:   IVDU (intravenous drug user)   Polysubstance (including opioids) dependence, daily use (HCC)   Septic pulmonary embolism (HCC)   Septic shock (HCC)   Encounter for central line placement   Respiratory failure (HCC)   MRSA bacteremia   Hypokalemia   Hypophosphatemia   Endocarditis   Thrombocytopenia (HCC)   RUQ pain   Right hip pain   Pneumothorax on right   Tricuspid valve regurgitation,  infectious   Pleural effusion on right    MRSA bacteremia/septic shock/tricuspid valve endocarditis/septic emboli to lungs/MRSA empyema Patient is stable. She initially required pressors due to septic shock. Echocardiogram showed tricuspid valve vegetations with moderate TR. She also underwent TEE which also showed findings suggestive of endocarditis. HIV, hepatitis C and RPR were negative. Patient was seen by infectious disease and cardiothoracic surgery. Patient was initially placed on vancomycin. Due to persistent bacteremia she was started on daptomycin and ceftazidime. These have been discontinued. Only on vancomycin for now. PICC line was placed on 6/9. Vancomycin to continue until July 15. Patient will remain inpatient until then.  Right spontaneous pneumothorax Seen by pulmonology as well as cardiothoracic. Chest tube was placed. Now removed. Patient is stable.  Anxiety Patient has significant anxiety and per family is primary reason for patient's relapse with drug abuse. Patient was started on Xanax in May and on BuSpar and gabapentin. Celexa was attempted, but discontinued per patient request. The Suboxone clinic has recommended no benzodiazepine. She was tapered off of Xanax.  Acute kidney injury Baseline creatinine was 0.7. Increased to 1.37. Thought to be secondary to dehydration and NSAIDs. Now improved.  Right upper quadrant abdominal pain.  CT scan of the abdomen pelvis did not show any acute abnormalities. Pain thought to be secondary to musculoskeletal in origin. Now resolved.  Ascites/anasarca due to right heart failure. This is thought to be a result of severe TR. Patient was placed on Lasix with improvement.  Splenomegaly. Detected on CT scan. Will need continued monitoring as outpatient.  Normocytic anemia  Hemoglobin stable. Last transfused on 6/5.  Hypokalemia/hypophosphatemia/hypomagnesemia. Resolved.  Thrombocytopenia. Resolved  Heroin abuse Continue  Suboxone. Will transition to a drug rehabilitation program once stable.  Tobacco abuse Continue nicotine patch  DVT Prophylaxis: Subcutaneous heparin    Code Status: Full code  Family Communication: Discussed with the patient  Disposition Plan: Continue management as outlined above. Ambulate.    LOS: 33 days   [facility]  [facility] Pager [phone] 12/13/2016, 9:38 AM  If 7PM-7AM, please contact night-coverage at [email or web address], password [facility]

## 2016-12-14 NOTE — Progress Notes (Signed)
[facility] PROGRESS NOTE  Savannah Palmer UEA:[id number] DOB: May 15, 1991 DOA: 11/10/2016  PCP: Patient, No Pcp Per  Brief History/Interval Summary: 25 year old Caucasian female with a past medical history of anxiety, depression, Heroin abuse, presented with weakness, cough and chest pain. Patient was initially in septic shock and required pressors. Blood cultures were positive for MRSA. Patient has a history of tricuspid valve endocarditis and was found to have septic emboli throughout her lungs. She also developed spontaneous right-sided pneumothorax requiring chest tube placement. Patient was also seen by cardiothoracic surgery and was not thought to be a candidate for surgical intervention for her endocarditis.  Reason for Visit: Tricuspid valve endocarditis  Consultants: Infectious disease. Cardiothoracic surgery. Pulmonology  Procedures:  TEE. Chest tube placement. PICC line placement.  Antibiotics: Currently on vancomycin alone.  She was on Teflaro and daptomycin previously.  Subjective/Interval History: No complaints offered. Denies any chest pain, shortness of breath.   Objective:  Vital Signs  Vitals:   12/13/16 0734 12/13/16 1547 12/13/16 2209 12/14/16 0541  BP: (!) 111/52 109/69 125/83 100/66  Pulse: 90 87 (!) 106 (!) 109  Resp: 18 18 17 17   Temp: 98.6 F (37 C) 98.2 F (36.8 C) 97.7 F (36.5 C) 98.5 F (36.9 C)  TempSrc:  Oral    SpO2: 100% 98% 99% 91%  Weight: 57.2 kg (126 lb 1.7 oz)   57.6 kg (126 lb 14.4 oz)  Height:       No intake or output data in the 24 hours ending 12/14/16 0842 Filed Weights   12/12/16 0518 12/13/16 0734 12/14/16 0541  Weight: 56.2 kg (123 lb 12.8 oz) 57.2 kg (126 lb 1.7 oz) 57.6 kg (126 lb 14.4 oz)    General appearance: Awake and alert. No distress Resp: Clear to auscultation bilaterally. No wheezing, rales, rhonchi Cardio: S1, S2 is normal, regular. No S3, S4. No rubs, murmurs or bruit GI: Abdomen is soft.  Nontender. Nondistended. Sounds are present. No masses or organomegaly Extremities: No edema   Lab Results:  Data Reviewed: I have personally reviewed following labs and imaging studies  CBC:  Recent Labs Lab 12/10/16 0903  WBC 6.2  HGB 11.4*  HCT 35.2*  MCV 88.9  PLT 262    Basic Metabolic Panel:  Recent Labs Lab 12/09/16 0419 12/10/16 0903 12/12/16 0520  NA  --  135  --   K  --  4.3  --   CL  --  99*  --   CO2  --  28  --   GLUCOSE  --  90  --   BUN  --  16  --   CREATININE 0.81 0.95 0.95  CALCIUM  --  9.8  --     GFR: Estimated Creatinine Clearance: 74.2 mL/min (by C-G formula based on SCr of 0.95 mg/dL).    Radiology Studies: No results found.   Medications:  Scheduled: . buprenorphine-naloxone  2 tablet Sublingual Daily  . busPIRone  10 mg Oral TID  . furosemide  20 mg Oral Once per day on Tue Thu Sat  . gabapentin  400 mg Oral QID  . heparin  5,000 Units Subcutaneous Q8H  . nicotine  14 mg Transdermal Daily  . potassium chloride  40 mEq Oral Daily  . saccharomyces boulardii  250 mg Oral BID   Continuous: . sodium chloride 250 mL (11/28/16 2032)  . vancomycin 500 mg (12/14/16 4782)   NFA:OZHYQM chloride, acetaminophen, albuterol, docusate sodium, ibuprofen, nicotine polacrilex, ondansetron (ZOFRAN) IV,  oxyCODONE, sodium chloride flush  Assessment/Plan:  Principal Problem:   Bacterial endocarditis Active Problems:   IVDU (intravenous drug user)   Polysubstance (including opioids) dependence, daily use (HCC)   Septic pulmonary embolism (HCC)   Septic shock (HCC)   Encounter for central line placement   Respiratory failure (HCC)   MRSA bacteremia   Hypokalemia   Hypophosphatemia   Endocarditis   Thrombocytopenia (HCC)   RUQ pain   Right hip pain   Pneumothorax on right   Tricuspid valve regurgitation, infectious   Pleural effusion on right    MRSA bacteremia/septic shock/tricuspid valve endocarditis/septic emboli to lungs/MRSA  empyema Patient is stable. She initially required pressors due to septic shock. Echocardiogram showed tricuspid valve vegetations with moderate TR. She also underwent TEE which also showed findings suggestive of endocarditis. HIV, hepatitis C and RPR were negative. Patient was seen by infectious disease and cardiothoracic surgery. Patient was initially placed on vancomycin. Due to persistent bacteremia she was started on daptomycin and ceftazidime. These have been discontinued. Only on vancomycin for now. PICC line was placed on 6/9. Vancomycin to continue until July 15. Patient will remain inpatient until then. Remains stable.  Right spontaneous pneumothorax Seen by pulmonology as well as cardiothoracic. Chest tube was placed. Now removed. Patient is stable.  Anxiety Patient has significant anxiety and per family is primary reason for patient's relapse with drug abuse. Patient was started on Xanax in May and on BuSpar and gabapentin. Celexa was attempted, but discontinued per patient request. The Suboxone clinic has recommended no benzodiazepine. She was tapered off of Xanax. Stable.  Acute kidney injury Baseline creatinine was 0.7. Increased to 1.37. Thought to be secondary to dehydration and NSAIDs. Now improved.  Right upper quadrant abdominal pain.  CT scan of the abdomen pelvis did not show any acute abnormalities. Pain thought to be secondary to musculoskeletal in origin. Now resolved.  Ascites/anasarca due to right heart failure. This is thought to be a result of severe TR. Patient was placed on Lasix with improvement.  Splenomegaly. Detected on CT scan. Will need continued monitoring as outpatient.  Normocytic anemia  Hemoglobin stable. Last transfused on 6/5.  Hypokalemia/hypophosphatemia/hypomagnesemia. Resolved.  Thrombocytopenia. Resolved  Heroin abuse Continue Suboxone. Will transition to a drug rehabilitation program once stable.  Tobacco abuse Continue nicotine  patch  DVT Prophylaxis: Subcutaneous heparin    Code Status: Full code  Family Communication: Discussed with the patient  Disposition Plan: Continue management as outlined above. Ambulate.    LOS: 34 days   [facility],Gorge Almanza  [facility] Pager [phone] 12/14/2016, 8:42 AM  If 7PM-7AM, please contact night-coverage at [email or web address], password [email or web address]

## 2016-12-14 NOTE — Progress Notes (Signed)
Nutrition Brief Note  RD pulled to chart due to LOS (34 days)  Wt Readings from Last 15 Encounters:  12/14/16 126 lb 14.4 oz (57.6 kg)  03/14/16 129 lb (58.5 kg)  01/19/16 128 lb (58.1 kg)  01/13/16 166 lb 6.4 oz (1475.235 kg)   26 year old Caucasian female with a past medical history of anxiety, depression, heroin abuse, presented with weakness, cough and chest pain. Initially was in septic shock and required pressors. Blood cultures grew MRSA. 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 She was placed on IV antibiotics. She also developed a spontaneous right-sided pneumothorax. Chest tube was placed by critical care medicine. [facility]. Patient is not a candidate for surgery at this time. PTX, resolved, remains stable,  inpatient on IV Vanc and Po suboxone till 7/15  Reviewed wt hx; UBW ranges from 124-130#. Pt to remains in hospital until IV antibiotics are completed, secondary to IVDU.  Body mass index is 22.48 kg/m. Patient meets criteria for normal weight range based on current BMI.   Current diet order is regular, patient is consuming approximately 75-100% of meals at this time. Labs and medications reviewed.   No nutrition interventions warranted at this time. If nutrition issues arise, please consult RD.   Satara Virella A. Mayford KnifeWilliams, RD, LDN, CDE Pager: [phone] After hours Pager: [phone]

## 2016-12-15 LAB — BASIC METABOLIC PANEL
Anion gap: 6 (ref 5–15)
BUN: 19 mg/dL (ref 6–20)
CO2: 27 mmol/L (ref 22–32)
CREATININE: 0.84 mg/dL (ref 0.44–1.00)
Calcium: 9.2 mg/dL (ref 8.9–10.3)
Chloride: 103 mmol/L (ref 101–111)
GFR calc Af Amer: >60 mL/min (ref >60)
GFR calc non Af Amer: >60 mL/min (ref >60)
GLUCOSE: 104 mg/dL — AB (ref 65–99)
POTASSIUM: 4 mmol/L (ref 3.5–5.1)
SODIUM: 136 mmol/L (ref 135–145)

## 2016-12-15 NOTE — Progress Notes (Signed)
[facility] PROGRESS NOTE  [name] ZOX:[id number] DOB: 1991/04/10 DOA: 11/10/2016  PCP: Patient, No Pcp Per  Brief History/Interval Summary: 26 year old Caucasian female with a past medical history of anxiety, depression, Heroin abuse, presented with weakness, cough and chest pain. Patient was initially in septic shock and required pressors. Blood cultures were positive for MRSA. Patient has a history of tricuspid valve endocarditis and was found to have septic emboli throughout her lungs. She also developed spontaneous right-sided pneumothorax requiring chest tube placement. Patient was also seen by cardiothoracic surgery and was not thought to be a candidate for surgical intervention for her endocarditis. She will be in the hospital till she completes her antibiotic treatment.  Reason for Visit: Tricuspid valve endocarditis  Consultants: Infectious disease. Cardiothoracic surgery. Pulmonology  Procedures:  TEE. Chest tube placement. PICC line placement.  Antibiotics: Currently on vancomycin alone.  She was on Teflaro and daptomycin previously.  Subjective/Interval History: Denies any complaints.  Objective:  Vital Signs  Vitals:   12/14/16 0541 12/14/16 1656 12/14/16 2231 12/15/16 0543  BP: 100/66 110/75 107/75 107/63  Pulse: (!) 109 (!) 102 (!) 105 90  Resp: 17 18 17 17   Temp: 98.5 F (36.9 C)  97.8 F (36.6 C) 98.5 F (36.9 C)  TempSrc:      SpO2: 91% 96% 100% 100%  Weight: 57.6 kg (126 lb 14.4 oz)   56.9 kg (125 lb 6.4 oz)  Height:        Intake/Output Summary (Last 24 hours) at 12/15/16 0927 Last data filed at 12/15/16 0644  Gross per 24 hour  Intake            897.5 ml  Output                0 ml  Net            897.5 ml   Filed Weights   12/13/16 0734 12/14/16 0541 12/15/16 0543  Weight: 57.2 kg (126 lb 1.7 oz) 57.6 kg (126 lb 14.4 oz) 56.9 kg (125 lb 6.4 oz)    General appearance: Awake, alert. No distress Resp: Clear to auscultation  bilaterally. No wheezing, rales or rhonchi Cardio: S1, S2 is normal, regular. No S3, S4. No rubs, murmurs or GI: Abdomen is soft, nontender, nondistended. Bowel sounds are present. No masses, organomegaly Extremities: No appreciable edema   Lab Results:  Data Reviewed: I have personally reviewed following labs and imaging studies  CBC:  Recent Labs Lab 12/10/16 0903  WBC 6.2  HGB 11.4*  HCT 35.2*  MCV 88.9  PLT 262    Basic Metabolic Panel:  Recent Labs Lab 12/09/16 0419 12/10/16 0903 12/12/16 0520 12/15/16 0458  NA  --  135  --  136  K  --  4.3  --  4.0  CL  --  99*  --  103  CO2  --  28  --  27  GLUCOSE  --  90  --  104*  BUN  --  16  --  19  CREATININE 0.81 0.95 0.95 0.84  CALCIUM  --  9.8  --  9.2    GFR: Estimated Creatinine Clearance: 84 mL/min (by C-G formula based on SCr of 0.84 mg/dL).    Radiology Studies: No results found.   Medications:  Scheduled: . buprenorphine-naloxone  2 tablet Sublingual Daily  . busPIRone  10 mg Oral TID  . furosemide  20 mg Oral Once per day on Tue Thu Sat  . gabapentin  400 mg Oral QID  . heparin  5,000 Units Subcutaneous Q8H  . nicotine  14 mg Transdermal Daily  . potassium chloride  40 mEq Oral Daily  . saccharomyces boulardii  250 mg Oral BID   Continuous: . sodium chloride 250 mL (11/28/16 2032)  . vancomycin Stopped (12/15/16 0703)   ZOX:WRUEAV chloride, acetaminophen, albuterol, docusate sodium, ibuprofen, nicotine polacrilex, ondansetron (ZOFRAN) IV, oxyCODONE, sodium chloride flush  Assessment/Plan:  Principal Problem:   Bacterial endocarditis Active Problems:   IVDU (intravenous drug user)   Polysubstance (including opioids) dependence, daily use (HCC)   Septic pulmonary embolism (HCC)   Septic shock (HCC)   Encounter for central line placement   Respiratory failure (HCC)   MRSA bacteremia   Hypokalemia   Hypophosphatemia   Endocarditis   Thrombocytopenia (HCC)   RUQ pain   Right hip  pain   Pneumothorax on right   Tricuspid valve regurgitation, infectious   Pleural effusion on right    MRSA bacteremia/septic shock/tricuspid valve endocarditis/septic emboli to lungs/MRSA empyema Patient is stable. She initially required pressors due to septic shock. Echocardiogram showed tricuspid valve vegetations with moderate TR. She also underwent TEE which also showed findings suggestive of endocarditis. HIV, hepatitis C and RPR were negative. Patient was seen by infectious disease and cardiothoracic surgery. Patient was initially placed on vancomycin. Due to persistent bacteremia she was started on daptomycin and ceftazidime. These have been discontinued. Only on vancomycin for now. PICC line was placed on 6/9. Vancomycin to continue until July 15. Patient will remain inpatient until then. Remains stable.  Right spontaneous pneumothorax Seen by pulmonology as well as cardiothoracic. Chest tube was placed. Now removed. Patient is stable.  Anxiety Patient has significant anxiety and per family is primary reason for patient's relapse with drug abuse. Patient was started on Xanax in May and on BuSpar and gabapentin. Celexa was attempted, but discontinued per patient request. The Suboxone clinic has recommended no benzodiazepine. She was tapered off of Xanax. Stable.  Acute kidney injury Baseline creatinine was 0.7. Increased to 1.37. Thought to be secondary to dehydration and NSAIDs. Now improved.  Right upper quadrant abdominal pain.  CT scan of the abdomen pelvis did not show any acute abnormalities. Pain thought to be secondary to musculoskeletal in origin. Now resolved.  Ascites/anasarca due to right heart failure. This is thought to be a result of severe TR. Patient was placed on Lasix with improvement.  Splenomegaly. Detected on CT scan. Will need continued monitoring as outpatient.  Normocytic anemia  Hemoglobin stable. Last transfused on  6/5.  Hypokalemia/hypophosphatemia/hypomagnesemia. Resolved.  Thrombocytopenia. Resolved  Heroin abuse Continue Suboxone. Will transition to a drug rehabilitation program once stable.  Tobacco abuse Continue nicotine patch  DVT Prophylaxis: Subcutaneous heparin    Code Status: Full code  Family Communication: Discussed with the patient  Disposition Plan: Continue management as outlined above. Ambulate.    LOS: 35 days   [facility]  [facility] Pager [phone] 12/15/2016, 9:27 AM  If 7PM-7AM, please contact night-coverage at [email or web address], password [facility]

## 2016-12-16 NOTE — Progress Notes (Signed)
[facility] PROGRESS NOTE  [name]:[id number] DOB: 1990/12/10 DOA: 11/10/2016  PCP: Savannah Palmer, No Pcp Per  Brief History/Interval Summary: 26 year old Caucasian female with a past medical history of anxiety, depression, Heroin abuse, presented with weakness, cough and chest pain. Savannah Palmer was initially in septic shock and required pressors. Blood cultures were positive for MRSA. 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 She also developed spontaneous right-sided pneumothorax requiring chest tube placement. Savannah Palmer was also seen by cardiothoracic surgery and was not thought to be a candidate for surgical intervention for her endocarditis. She will be in the hospital till she completes her antibiotic treatment.  Reason for Visit: Tricuspid valve endocarditis  Consultants: Infectious disease. Cardiothoracic surgery. Pulmonology  Procedures:  TEE. Chest tube placement. PICC line placement.  Antibiotics: Currently on vancomycin alone.  She was on Teflaro and daptomycin previously.  Subjective/Interval History: Feels well.  Objective:  Vital Signs  Vitals:   12/15/16 0543 12/15/16 1536 12/15/16 2139 12/16/16 0649  BP: 107/63 106/70 114/72 116/86  Pulse: 90 (!) 37 97 96  Resp: 17 18 18 18   Temp: 98.5 F (36.9 C) 98 F (36.7 C) 98.5 F (36.9 C) 97.6 F (36.4 C)  TempSrc:  Oral    SpO2: 100% 100% 99% 97%  Weight: 56.9 kg (125 lb 6.4 oz)     Height:        Intake/Output Summary (Last 24 hours) at 12/16/16 0940 Last data filed at 12/16/16 0815  Gross per 24 hour  Intake              250 ml  Output                0 ml  Net              250 ml   Filed Weights   12/13/16 0734 12/14/16 0541 12/15/16 0543  Weight: 57.2 kg (126 lb 1.7 oz) 57.6 kg (126 lb 14.4 oz) 56.9 kg (125 lb 6.4 oz)    Resp: Clear to auscultation bilaterally Cardio: S1, S2 is normal, regular. Extremities: No  appreciable edema   Lab Results:  Data Reviewed: I have personally reviewed following labs and imaging studies  CBC:  Recent Labs Lab 12/10/16 0903  WBC 6.2  HGB 11.4*  HCT 35.2*  MCV 88.9  PLT 262    Basic Metabolic Panel:  Recent Labs Lab 12/10/16 0903 12/12/16 0520 12/15/16 0458  NA 135  --  136  K 4.3  --  4.0  CL 99*  --  103  CO2 28  --  27  GLUCOSE 90  --  104*  BUN 16  --  19  CREATININE 0.95 0.95 0.84  CALCIUM 9.8  --  9.2    GFR: Estimated Creatinine Clearance: 84 mL/min (by C-G formula based on SCr of 0.84 mg/dL).    Radiology Studies: No results found.   Medications:  Scheduled: . buprenorphine-naloxone  2 tablet Sublingual Daily  . busPIRone  10 mg Oral TID  . furosemide  20 mg Oral Once per day on Tue Thu Sat  . gabapentin  400 mg Oral QID  . heparin  5,000 Units Subcutaneous Q8H  . nicotine  14 mg Transdermal Daily  . potassium chloride  40 mEq Oral Daily  . saccharomyces boulardii  250 mg Oral BID   Continuous: . sodium chloride 250 mL (11/28/16 2032)  . vancomycin Stopped (12/16/16 0725)  UJW:JXBJYNPRN:sodium chloride, acetaminophen, albuterol, docusate sodium, ibuprofen, nicotine polacrilex, ondansetron (ZOFRAN) IV, oxyCODONE, sodium chloride flush  Assessment/Plan:  Principal Problem:   Bacterial endocarditis Active Problems:   IVDU (intravenous drug user)   Polysubstance (including opioids) dependence, daily use (HCC)   Septic pulmonary embolism (HCC)   Septic shock (HCC)   Encounter for central line placement   Respiratory failure (HCC)   MRSA bacteremia   Hypokalemia   Hypophosphatemia   Endocarditis   Thrombocytopenia (HCC)   RUQ pain   Right hip pain   Pneumothorax on right   Tricuspid valve regurgitation, infectious   Pleural effusion on right    MRSA bacteremia/septic shock/tricuspid valve endocarditis/septic emboli to lungs/MRSA empyema Savannah Palmer is stable. She initially required pressors due to septic shock.  Echocardiogram showed tricuspid valve vegetations with moderate TR. She also underwent TEE which also showed findings suggestive of endocarditis. HIV, hepatitis C and RPR were negative. Savannah Palmer was seen by infectious disease and cardiothoracic surgery. Savannah Palmer was initially placed on vancomycin. Due to persistent bacteremia she was started on daptomycin and ceftazidime. These have been discontinued. Only on vancomycin for now. PICC line was placed on 6/9. Vancomycin to continue until July 15. Savannah Palmer will remain inpatient until then. Remains stable.  Right spontaneous pneumothorax Seen by pulmonology as well as cardiothoracic. Chest tube was placed. Now removed. Savannah Palmer is stable.  Anxiety Savannah Palmer has significant anxiety and per family is primary reason for Savannah Palmer's relapse with drug abuse. Savannah Palmer was started on Xanax in May and on BuSpar and gabapentin. Celexa was attempted, but discontinued per Savannah Palmer request. The Suboxone clinic has recommended no benzodiazepine. She was tapered off of Xanax. Stable.  Acute kidney injury Baseline creatinine was 0.7. Increased to 1.37. Thought to be secondary to dehydration and NSAIDs. Now improved.  Right upper quadrant abdominal pain.  CT scan of the abdomen pelvis did not show any acute abnormalities. Pain thought to be secondary to musculoskeletal in origin. Now resolved.  Ascites/anasarca due to right heart failure. This is thought to be a result of severe TR. Savannah Palmer was placed on Lasix with improvement.  Splenomegaly. Detected on CT scan. Will need continued monitoring as outpatient.  Normocytic anemia  Hemoglobin stable. Last transfused on 6/5.  Hypokalemia/hypophosphatemia/hypomagnesemia. Resolved.  Thrombocytopenia. Resolved  Heroin abuse Continue Suboxone. Will transition to a drug rehabilitation program once stable.  Tobacco abuse Continue nicotine patch  DVT Prophylaxis: Subcutaneous heparin    Code Status: Full code  Family  Communication: Discussed with the Savannah Palmer  Disposition Plan: Continue management as outlined above. Ambulate.    LOS: 36 days   [facility] HospitalKRISHNAN,Tajuana Kniskern  [facility] Pager [phone] 12/16/2016, 9:40 AM  If 7PM-7AM, please contact night-coverage at [email or web address], password [email or web address]

## 2016-12-16 NOTE — Progress Notes (Signed)
Pharmacy Antibiotic Note Savannah Palmer is a 26 y.o. 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 Pharmacy has been consulted for Vancomycin dosing.   6 week course of Vanc planned thru 12/30/16.  Vanc dose decreased on 6/25 when level was 21 mcg/ml on 750 mg IV q12, just above goal  Last trough level 15 mcg/ml on 500 mg IV q12h.  Goal trough 15-20 mcg/ml  Plan: Continue Vancomycin to 500 mg IV q12hrs. Vanc troughs at least twice a week while on Vancomycin. Weekly bmet and CBC due in am, will check Vanc trough in am. For 6 weeks of treatment, through 12/30/2016   Height: 5\' 3"  (160 cm) Weight: 125 lb 6.4 oz (56.9 kg) IBW/kg (Calculated) : 52.4  Temp (24hrs), Avg:98 F (36.7 C), Min:97.6 F (36.4 C), Max:98.5 F (36.9 C)   Recent Labs Lab 12/10/16 0903 12/12/16 0520 12/13/16 0513 12/15/16 0458  WBC 6.2  --   --   --   CREATININE 0.95 0.95  --  0.84  VANCOTROUGH 21*  --  15  --     Estimated Creatinine Clearance: 84 mL/min (by C-G formula based on SCr of 0.84 mg/dL).    No Known Allergies  5/26 Zosyn x 1 5/26 Vanc >> 6/1;   6/8 >> 5/26 Cefepime >> 5/27 5/31 Teflaro >> 6/8 6/2 Daptomycin >> 6/8   5/27: increase vanc to 750 q8 with improved renal function 5/29  VT: 13 (subtherapeutic) on 750mg  q8h prior to 7th dose, inc to 1g q8h 5/31  VT: 19 (therapeutic) - continue 1g q8h 6/11  VT: 38 (accumulated on q8h dose)  VANC HOLD 6/11 VR 19 (t1/2 ~11.5 > reduced to 1000mg  q12) 6/14 VT 23 on 1g q12h > reduced to 750mg  q12h 6/16 VT 17 on 750mg  q12h > no changes 6/22: VT 17 on 750 mg q12h 6/25: VT 21(slightly supratherapeutic) on 750 mg q12h > reduced to 500 mg q12h 6/28: VT 15 mcg/ml on 500 mg q12h  5/26 UCx: NGF 5/26 BCx: 4/4 MRSA 5/26 MRSA PCR: positive  5/27 BCx: 2/2 MRSA  5/28 BCx: 2/2 MRSA 5/28 HIV antibody: non reactive 5/28 HCV RNA: ND 5/29 BCx: MRSA, BCID = MRSA 5/30 @ 0033 BCx: 1/2 MRSA 5/30 @  1610 BCx: ngF 6/1 pleural fluid Cx: MRSA 6/4 BCx: ngF  Thank you for allowing pharmacy to be a part of this patient's care.  Dennie Fettersgan, Chaniya Genter [place] , [place] Pager: [phone]  12/16/2016 3:29 PM

## 2016-12-17 DIAGNOSIS — R0602 Shortness of breath: Secondary | ICD-10-CM

## 2016-12-17 LAB — CBC
HCT: 33.4 % — ABNORMAL LOW (ref 36.0–46.0)
HEMOGLOBIN: 10.8 g/dL — AB (ref 12.0–15.0)
MCH: 28.3 pg (ref 26.0–34.0)
MCHC: 32.3 g/dL (ref 30.0–36.0)
MCV: 87.4 fL (ref 78.0–100.0)
Platelets: 219 10*3/uL (ref 150–400)
RBC: 3.82 MIL/uL — AB (ref 3.87–5.11)
RDW: 15.4 % (ref 11.5–15.5)
WBC: 5.6 10*3/uL (ref 4.0–10.5)

## 2016-12-17 LAB — MRSA PCR SCREENING: MRSA by PCR: NEGATIVE

## 2016-12-17 LAB — BASIC METABOLIC PANEL
ANION GAP: 9 (ref 5–15)
BUN: 16 mg/dL (ref 6–20)
CHLORIDE: 103 mmol/L (ref 101–111)
CO2: 23 mmol/L (ref 22–32)
Calcium: 9.4 mg/dL (ref 8.9–10.3)
Creatinine, Ser: 0.82 mg/dL (ref 0.44–1.00)
GFR calc Af Amer: >60 mL/min (ref >60)
GFR calc non Af Amer: >60 mL/min (ref >60)
Glucose, Bld: 102 mg/dL — ABNORMAL HIGH (ref 65–99)
POTASSIUM: 4.1 mmol/L (ref 3.5–5.1)
SODIUM: 135 mmol/L (ref 135–145)

## 2016-12-17 LAB — VANCOMYCIN, TROUGH: VANCOMYCIN TR: 12 ug/mL — AB (ref 15–20)

## 2016-12-17 MED ORDER — VANCOMYCIN HCL IN DEXTROSE 750-5 MG/150ML-% IV SOLN
750.0000 mg | Freq: Two times a day (BID) | INTRAVENOUS | Status: DC
  Administered 2016-12-17 – 2016-12-30 (×28): 750 mg via INTRAVENOUS
  Filled 2016-12-17 (×29): qty 150

## 2016-12-17 NOTE — Progress Notes (Signed)
[facility] PROGRESS NOTE  Savannah NoeVictoria L Palmer GNF:[id number] DOB: 25-Feb-1991 DOA: 11/10/2016  PCP: Patient, No Pcp Per  Brief History/Interval Summary: 26 year old Caucasian female with a past medical history of anxiety, depression, Heroin abuse, presented with weakness, cough and chest pain. Patient was initially in septic shock and required pressors. Blood cultures were positive for MRSA. Patient has a history of tricuspid valve endocarditis and was found to have septic emboli throughout her lungs. She also developed spontaneous right-sided pneumothorax requiring chest tube placement. Patient was also seen by cardiothoracic surgery and was not thought to be a candidate for surgical intervention for her endocarditis. She will be in the hospital till she completes her antibiotic treatment.  Reason for Visit: Tricuspid valve endocarditis  Consultants: Infectious disease. Cardiothoracic surgery. Pulmonology  Procedures:  TEE. Chest tube placement. PICC line placement.  Antibiotics: Currently on vancomycin alone.  She was on Teflaro and daptomycin previously.  Subjective/Interval History: Patient feels well. She is asking why her breathing is not completely back to normal. Still experiences occasional discomfort when she takes a deep breath. Overall she has been improving. Patient was reassured. Explained that complete recovery may take a few more weeks.  Objective:  Vital Signs  Vitals:   12/16/16 1645 12/16/16 2053 12/17/16 0008 12/17/16 0627  BP: 108/62 108/86  97/62  Pulse: 100 94    Resp: 20 16    Temp: 98.1 F (36.7 C) 98.5 F (36.9 C)  98.3 F (36.8 C)  TempSrc: Oral Oral  Oral  SpO2: 100% 100%  99%  Weight:   56.2 kg (124 lb)   Height:        Intake/Output Summary (Last 24 hours) at 12/17/16 0925 Last data filed at 12/16/16 1859  Gross per 24 hour  Intake            462.5 ml  Output                0 ml  Net            462.5 ml   Filed Weights   12/14/16  0541 12/15/16 0543 12/17/16 0008  Weight: 57.6 kg (126 lb 14.4 oz) 56.9 kg (125 lb 6.4 oz) 56.2 kg (124 lb)    Resp: Normal effort. Clear to auscultation bilaterally. No wheezing, rales or rhonchi Cardio: S1, S2 normal. Regular. Extremities: No appreciable edema   Lab Results:  Data Reviewed: I have personally reviewed following labs and imaging studies  CBC:  Recent Labs Lab 12/17/16 0456  WBC 5.6  HGB 10.8*  HCT 33.4*  MCV 87.4  PLT 219    Basic Metabolic Panel:  Recent Labs Lab 12/12/16 0520 12/15/16 0458 12/17/16 0456  NA  --  136 135  K  --  4.0 4.1  CL  --  103 103  CO2  --  27 23  GLUCOSE  --  104* 102*  BUN  --  19 16  CREATININE 0.95 0.84 0.82  CALCIUM  --  9.2 9.4    GFR: Estimated Creatinine Clearance: 86 mL/min (by C-G formula based on SCr of 0.82 mg/dL).    Radiology Studies: No results found.   Medications:  Scheduled: . buprenorphine-naloxone  2 tablet Sublingual Daily  . busPIRone  10 mg Oral TID  . furosemide  20 mg Oral Once per day on Tue Thu Sat  . gabapentin  400 mg Oral QID  . heparin  5,000 Units Subcutaneous Q8H  . nicotine  14 mg Transdermal Daily  .  potassium chloride  40 mEq Oral Daily  . saccharomyces boulardii  250 mg Oral BID   Continuous: . sodium chloride 250 mL (11/28/16 2032)  . vancomycin 750 mg (12/17/16 1610)   RUE:AVWUJW chloride, acetaminophen, albuterol, docusate sodium, ibuprofen, nicotine polacrilex, ondansetron (ZOFRAN) IV, oxyCODONE, sodium chloride flush  Assessment/Plan:  Principal Problem:   Bacterial endocarditis Active Problems:   IVDU (intravenous drug user)   Polysubstance (including opioids) dependence, daily use (HCC)   Septic pulmonary embolism (HCC)   Septic shock (HCC)   Encounter for central line placement   Respiratory failure (HCC)   MRSA bacteremia   Hypokalemia   Hypophosphatemia   Endocarditis   Thrombocytopenia (HCC)   RUQ pain   Right hip pain   Pneumothorax on right    Tricuspid valve regurgitation, infectious   Pleural effusion on right    MRSA bacteremia/septic shock/tricuspid valve endocarditis/septic emboli to lungs/MRSA empyema Patient is stable. She initially required pressors due to septic shock. Echocardiogram showed tricuspid valve vegetations with moderate TR. She also underwent TEE which also showed findings suggestive of endocarditis. HIV, hepatitis C and RPR were negative. Patient was seen by infectious disease and cardiothoracic surgery. Patient was initially placed on vancomycin. Due to persistent bacteremia she was started on daptomycin and ceftazidime. These have been discontinued. Only on vancomycin for now. PICC line was placed on 6/9. Vancomycin to continue until July 15. Patient will remain inpatient until then. Remains stable.  Right spontaneous pneumothorax Seen by pulmonology as well as cardiothoracic. Chest tube was placed and then removed. Patient is stable. Patient reassured regarding her symptoms. Incentive spirometry was also recommended. She is saturating normal on room air.  Anxiety Patient has significant anxiety and per family is primary reason for patient's relapse with drug abuse. Patient was started on Xanax in May and on BuSpar and gabapentin. Celexa was attempted, but discontinued per patient request. The Suboxone clinic has recommended no benzodiazepine. She was tapered off of Xanax. Stable.  Acute kidney injury Baseline creatinine was 0.7. Increased to 1.37. Thought to be secondary to dehydration and NSAIDs. Now improved.  Right upper quadrant abdominal pain.  CT scan of the abdomen pelvis did not show any acute abnormalities. Pain thought to be secondary to musculoskeletal in origin. Now resolved.  Ascites/anasarca due to right heart failure. This is thought to be a result of severe TR. Patient was placed on Lasix with improvement.  Splenomegaly. Detected on CT scan. Will need continued monitoring as  outpatient.  Normocytic anemia  Hemoglobin stable. Last transfused on 6/5.  Hypokalemia/hypophosphatemia/hypomagnesemia. Resolved.  Thrombocytopenia. Resolved  Heroin abuse Continue Suboxone. Will transition to a drug rehabilitation program once stable.  Tobacco abuse Continue nicotine patch  DVT Prophylaxis: Subcutaneous heparin    Code Status: Full code  Family Communication: Discussed with the patient  Disposition Plan: Remain stable. Incentive spirometry. Ambulate.    LOS: 37 days   [facility]  [facility] Pager [phone] 12/17/2016, 9:25 AM  If 7PM-7AM, please contact night-coverage at [email or web address], password [facility]

## 2016-12-17 NOTE — Progress Notes (Signed)
Pharmacy Antibiotic Note Savannah Palmer is a 26 y.o. 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 Pharmacy following for vancomycin dosing. 6 week course of Vanc planned thru 12/30/16.  Vancomycin trough of 12 this am is below desired range of 15-20. SCr stable   Plan: 1. Increase vancomycin back to 750 mg IV q12hrs 2. Vanc troughs at least twice a week while on vancomycin 3. Weekly bmet and CBC 4. 6 weeks of treatment, through 12/30/2016   Height: 5\' 3"  (160 cm) Weight: 124 lb (56.2 kg) IBW/kg (Calculated) : 52.4  Temp (24hrs), Avg:98.1 F (36.7 C), Min:97.6 F (36.4 C), Max:98.5 F (36.9 C)   Recent Labs Lab 12/10/16 0903 12/12/16 0520 12/13/16 0513 12/15/16 0458 12/17/16 0456  WBC 6.2  --   --   --  5.6  CREATININE 0.95 0.95  --  0.84 0.82  VANCOTROUGH 21*  --  15  --  12*    Estimated Creatinine Clearance: 86 mL/min (by C-G formula based on SCr of 0.82 mg/dL).    No Known Allergies  5/26 Zosyn x 1 5/26 Vanc >> 6/1;   6/8 >> 5/26 Cefepime >> 5/27 5/31 Teflaro >> 6/8 6/2 Daptomycin >> 6/8   5/27: increase vanc to 750 q8 with improved renal function 5/29  VT: 13 (subtherapeutic) on 750mg  q8h prior to 7th dose, inc to 1g q8h 5/31  VT: 19 (therapeutic) - continue 1g q8h 6/11  VT: 38 (accumulated on q8h dose)  VANC HOLD 6/11 VR 19 (t1/2 ~11.5 > reduced to 1000mg  q12) 6/14 VT 23 on 1g q12h > reduced to 750mg  q12h 6/16 VT 17 on 750mg  q12h > no changes 6/22: VT 17 on 750 mg q12h 6/25: VT 21(slightly supratherapeutic) on 750 mg q12h > reduced to 500 mg q12h 6/28: VT 15 mcg/ml on 500 mg q12h 7/2: VT 12 mcg/ml on 750 mg q 12h   5/26 UCx: NGF 5/26 BCx: 4/4 MRSA 5/26 MRSA PCR: positive  5/27 BCx: 2/2 MRSA  5/28 BCx: 2/2 MRSA 5/28 HIV antibody: non reactive 5/28 HCV RNA: ND 5/29 BCx: MRSA, BCID = MRSA 5/30 @ 0033 BCx: 1/2 MRSA 5/30 @ 1610 BCx: ngF 6/1 pleural fluid Cx: MRSA 6/4 BCx: ngF  Thank you  for allowing pharmacy to be a part of this patient's care.  Pollyann SamplesAndy Andria Head, PharmD, BCPS 12/17/2016, 5:47 AM

## 2016-12-18 NOTE — Progress Notes (Signed)
[facility] PROGRESS NOTE  Savannah NoeVictoria L XXXCrews GNF:[id number] DOB: 09/24/1990 DOA: 11/10/2016  PCP: Patient, No Pcp Per  Brief History/Interval Summary: 26 year old Caucasian female with a past medical history of anxiety, depression, Heroin abuse, presented with weakness, cough and chest pain. Patient was initially in septic shock and required pressors. Blood cultures were positive for MRSA. Patient has a history of tricuspid valve endocarditis and was found to have septic emboli throughout her lungs. She also developed spontaneous right-sided pneumothorax requiring chest tube placement. Patient was also seen by cardiothoracic surgery and was not thought to be a candidate for surgical intervention for her endocarditis. She will be in the hospital till she completes her antibiotic treatment, which will be on July 15.  Reason for Visit: Tricuspid valve endocarditis  Consultants: Infectious disease. Cardiothoracic surgery. Pulmonology  Procedures:  TEE. Chest tube placement. PICC line placement.  Antibiotics: Currently on vancomycin alone.  She was on Teflaro and daptomycin previously.  Subjective/Interval History: Patient feels well this morning. Denies complaints. No questions today.   Objective:  Vital Signs  Vitals:   12/17/16 2202 12/17/16 2207 12/18/16 0517 12/18/16 0623  BP:  108/68 (!) 91/55   Pulse: 95 98 88   Resp:  17 15   Temp:  97.6 F (36.4 C) 98 F (36.7 C)   TempSrc:  Oral Oral   SpO2: 98% 100% 100%   Weight:    57.6 kg (127 lb)  Height:        Intake/Output Summary (Last 24 hours) at 12/18/16 0920 Last data filed at 12/18/16 0654  Gross per 24 hour  Intake             1170 ml  Output                0 ml  Net             1170 ml   Filed Weights   12/15/16 0543 12/17/16 0008 12/18/16 0623  Weight: 56.9 kg (125 lb 6.4 oz) 56.2 kg (124 lb) 57.6 kg (127 lb)    Resp: Normal effort. Clear to auscultation bilaterally. No wheezing, rales or  rhonchi. Cardio: His wrist is normal, regular. No S3, S4. Extremities: No edema   Lab Results:  Data Reviewed: I have personally reviewed following labs and imaging studies  CBC:  Recent Labs Lab 12/17/16 0456  WBC 5.6  HGB 10.8*  HCT 33.4*  MCV 87.4  PLT 219    Basic Metabolic Panel:  Recent Labs Lab 12/12/16 0520 12/15/16 0458 12/17/16 0456  NA  --  136 135  K  --  4.0 4.1  CL  --  103 103  CO2  --  27 23  GLUCOSE  --  104* 102*  BUN  --  19 16  CREATININE 0.95 0.84 0.82  CALCIUM  --  9.2 9.4    GFR: Estimated Creatinine Clearance: 86 mL/min (by C-G formula based on SCr of 0.82 mg/dL).    Radiology Studies: No results found.   Medications:  Scheduled: . buprenorphine-naloxone  2 tablet Sublingual Daily  . busPIRone  10 mg Oral TID  . furosemide  20 mg Oral Once per day on Tue Thu Sat  . gabapentin  400 mg Oral QID  . heparin  5,000 Units Subcutaneous Q8H  . nicotine  14 mg Transdermal Daily  . potassium chloride  40 mEq Oral Daily  . saccharomyces boulardii  250 mg Oral BID   Continuous: . sodium chloride 250  mL (11/28/16 2032)  . vancomycin Stopped (12/18/16 0723)   XBM:WUXLKG chloride, acetaminophen, albuterol, docusate sodium, ibuprofen, nicotine polacrilex, ondansetron (ZOFRAN) IV, oxyCODONE, sodium chloride flush  Assessment/Plan:  Principal Problem:   Bacterial endocarditis Active Problems:   IVDU (intravenous drug user)   Polysubstance (including opioids) dependence, daily use (HCC)   Septic pulmonary embolism (HCC)   Septic shock (HCC)   Encounter for central line placement   Respiratory failure (HCC)   MRSA bacteremia   Hypokalemia   Hypophosphatemia   Endocarditis   Thrombocytopenia (HCC)   RUQ pain   Right hip pain   Pneumothorax on right   Tricuspid valve regurgitation, infectious   Pleural effusion on right    MRSA bacteremia/septic shock/tricuspid valve endocarditis/septic emboli to lungs/MRSA empyema Patient is  stable. She initially required pressors due to septic shock. Echocardiogram showed tricuspid valve vegetations with moderate TR. She also underwent TEE which also showed findings suggestive of endocarditis. HIV, hepatitis C and RPR were negative. Patient was seen by infectious disease and cardiothoracic surgery. Patient was initially placed on vancomycin. Due to persistent bacteremia she was started on daptomycin and ceftazidime. These have been discontinued. Only on vancomycin for now. PICC line was placed on 6/9. Vancomycin to continue through July 15. Patient will remain inpatient until then. Remains stable.  Right spontaneous pneumothorax Seen by pulmonology as well as cardiothoracic. Chest tube was placed and then removed. Patient is stable. Patient reassured regarding her symptoms. Incentive spirometry was also recommended. She is saturating normal on room air.  Anxiety Patient has significant anxiety and per family is primary reason for patient's relapse with drug abuse. Patient was started on Xanax in May and on BuSpar and gabapentin. Celexa was attempted, but discontinued per patient request. The Suboxone clinic has recommended no benzodiazepine. She was tapered off of Xanax. Stable.  Acute kidney injury Baseline creatinine was 0.7. Increased to 1.37. Thought to be secondary to dehydration and NSAIDs. Now improved.  Right upper quadrant abdominal pain.  CT scan of the abdomen pelvis did not show any acute abnormalities. Pain thought to be secondary to musculoskeletal in origin. Now resolved.  Ascites/anasarca due to right heart failure. This is thought to be a result of severe TR. Patient was placed on Lasix with improvement.  Splenomegaly. Detected on CT scan. Will need continued monitoring as outpatient.  Normocytic anemia  Hemoglobin stable. Last transfused on 6/5.  Hypokalemia/hypophosphatemia/hypomagnesemia. Resolved.  Thrombocytopenia. Resolved  Heroin abuse Continue  Suboxone. Will transition to a drug rehabilitation program once stable. Social worker to provide resources for same.  Tobacco abuse Continue nicotine patch  DVT Prophylaxis: Subcutaneous heparin    Code Status: Full code  Family Communication: Discussed with the patient  Disposition Plan: Stable.    LOS: 38 days   [facility]  [facility] Pager [phone] 12/18/2016, 9:20 AM  If 7PM-7AM, please contact night-coverage at [email or web address], password [facility]

## 2016-12-19 NOTE — Progress Notes (Signed)
[facility] PROGRESS NOTE  Savannah NoeVictoria L Palmer VWU:[id number] DOB: 01-17-1991 DOA: 11/10/2016  PCP: Patient, No Pcp Per  Brief History/Interval Summary: 26 year old Caucasian female with a past medical history of anxiety, depression, Heroin abuse, presented with weakness, cough and chest pain. Patient was initially in septic shock and required pressors. Blood cultures were positive for MRSA. Patient has a history of tricuspid valve endocarditis and was found to have septic emboli throughout her lungs. She also developed spontaneous right-sided pneumothorax requiring chest tube placement. Patient was also seen by cardiothoracic surgery and was not thought to be a candidate for surgical intervention for her endocarditis. She will be in the hospital till she completes her antibiotic treatment, which will be on July 15.  Reason for Visit: Tricuspid valve endocarditis  Consultants: Infectious disease. Cardiothoracic surgery. Pulmonology  Procedures:  TEE. Chest tube placement. PICC line placement.  Antibiotics: Currently on vancomycin alone.  She was on Teflaro and daptomycin previously.  Subjective/Interval History:   No issues at all , denies any complaints .  Objective:  Vital Signs  Vitals:   12/18/16 0623 12/18/16 1453 12/18/16 2152 12/19/16 0612  BP:  118/73 108/72 103/65  Pulse:  83 (!) 110 97  Resp:  16 17 17   Temp:  98.5 F (36.9 C) 98.7 F (37.1 C) (!) 97.5 F (36.4 C)  TempSrc:  Oral Oral Oral  SpO2:  99% 99% 100%  Weight: 57.6 kg (127 lb)     Height:        Intake/Output Summary (Last 24 hours) at 12/19/16 1448 Last data filed at 12/19/16 1030  Gross per 24 hour  Intake            958.5 ml  Output                0 ml  Net            958.5 ml   Filed Weights   12/15/16 0543 12/17/16 0008 12/18/16 0623  Weight: 56.9 kg (125 lb 6.4 oz) 56.2 kg (124 lb) 57.6 kg (127 lb)   GEN : NAD . Abd: Soft , NT,ND. Resp: Normal effort. Clear to auscultation  bilaterally. No wheezing, rales or rhonchi. Cardio: His wrist is normal, regular. No S3, S4. Extremities: No edema   Lab Results:  Data Reviewed: I have personally reviewed following labs and imaging studies  CBC:  Recent Labs Lab 12/17/16 0456  WBC 5.6  HGB 10.8*  HCT 33.4*  MCV 87.4  PLT 219    Basic Metabolic Panel:  Recent Labs Lab 12/15/16 0458 12/17/16 0456  NA 136 135  K 4.0 4.1  CL 103 103  CO2 27 23  GLUCOSE 104* 102*  BUN 19 16  CREATININE 0.84 0.82  CALCIUM 9.2 9.4    GFR: Estimated Creatinine Clearance: 86 mL/min (by C-G formula based on SCr of 0.82 mg/dL).    Radiology Studies: No results found.   Medications:  Scheduled: . buprenorphine-naloxone  2 tablet Sublingual Daily  . busPIRone  10 mg Oral TID  . furosemide  20 mg Oral Once per day on Tue Thu Sat  . gabapentin  400 mg Oral QID  . heparin  5,000 Units Subcutaneous Q8H  . nicotine  14 mg Transdermal Daily  . potassium chloride  40 mEq Oral Daily  . saccharomyces boulardii  250 mg Oral BID   Continuous: . sodium chloride 250 mL (11/28/16 2032)  . vancomycin 750 mg (12/19/16 0615)   GNF:AOZHYQPRN:sodium chloride, acetaminophen,  albuterol, docusate sodium, ibuprofen, nicotine polacrilex, ondansetron (ZOFRAN) IV, oxyCODONE, sodium chloride flush  Assessment/Plan:  Principal Problem:   Bacterial endocarditis Active Problems:   IVDU (intravenous drug user)   Polysubstance (including opioids) dependence, daily use (HCC)   Septic pulmonary embolism (HCC)   Septic shock (HCC)   Encounter for central line placement   Respiratory failure (HCC)   MRSA bacteremia   Hypokalemia   Hypophosphatemia   Endocarditis   Thrombocytopenia (HCC)   RUQ pain   Right hip pain   Pneumothorax on right   Tricuspid valve regurgitation, infectious   Pleural effusion on right    MRSA bacteremia/septic shock/tricuspid valve endocarditis/septic emboli to lungs/MRSA empyema: IMPROVED. Patient remains  stable. She initially required pressors due to septic shock. Echocardiogram showed tricuspid valve vegetations with moderate TR. She also underwent TEE which also showed findings suggestive of endocarditis. HIV, hepatitis C and RPR were negative. Patient was seen by infectious disease and cardiothoracic surgery. Patient was initially placed on vancomycin. Due to persistent bacteremia she was started on daptomycin and ceftazidime. These have been discontinued. Only on vancomycin for now. PICC line was placed on 6/9. Vancomycin to continue through July 15. Patient will remain inpatient until then. Remains stable.  Right spontaneous pneumothorax:  Improved. Seen by pulmonology as well as cardiothoracic. Chest tube was placed and then removed. Patient is stable. Patient reassured regarding her symptoms. Incentive spirometry was also recommended. She is saturating normal on room air.  Anxiety Patient has significant anxiety and per family is primary reason for patient's relapse with drug abuse. Patient was started on Xanax in May and on BuSpar and gabapentin. Celexa was attempted, but discontinued per patient request. The Suboxone clinic has recommended no benzodiazepine. She was tapered off of Xanax. Stable.  Acute kidney injury Baseline creatinine was 0.7. Increased to 1.37. Thought to be secondary to dehydration and NSAIDs. Now improved.  Right upper quadrant abdominal pain.  CT scan of the abdomen pelvis did not show any acute abnormalities. Pain thought to be secondary to musculoskeletal in origin. Now resolved.  Ascites/anasarca due to right heart failure. This is thought to be a result of severe TR. Patient was placed on Lasix with improvement.  Splenomegaly. Detected on CT scan. Will need continued monitoring as outpatient.  Normocytic anemia  Hemoglobin stable. Last transfused on 6/5.  Hypokalemia/hypophosphatemia/hypomagnesemia. Resolved.  Thrombocytopenia. Resolved  Heroin  abuse Continue Suboxone. Will transition to a drug rehabilitation program once stable. Social worker to provide resources for same.  Tobacco abuse Continue nicotine patch  DVT Prophylaxis: Subcutaneous heparin    Code Status: Full code  Family Communication: Discussed with the patient  Disposition Plan: Stable.    LOS: 39 days   Efrain Sella  [facility] Pager [phone] 12/19/2016, 2:48 PM  If 7PM-7AM, please contact night-coverage at [email or web address], password [facility]

## 2016-12-20 NOTE — Progress Notes (Signed)
[facility] PROGRESS NOTE  Savannah NoeVictoria L XXXCrews ZOX:[id number] DOB: [birth year] DOA: 11/10/2016  PCP: Patient, No Pcp Per  Brief History/Interval Summary: 26 year old Caucasian female with a past medical history of anxiety, depression, Heroin abuse, presented with weakness, cough and chest pain. Patient was initially in septic shock and required pressors. Blood cultures were positive for MRSA. Patient has a history of tricuspid valve endocarditis and was found to have septic emboli throughout her lungs. She also developed spontaneous right-sided pneumothorax requiring chest tube placement. Patient was also seen by cardiothoracic surgery and was not thought to be a candidate for surgical intervention for her endocarditis. She will be in the hospital till she completes her antibiotic treatment, which will be on July 15.  Reason for Visit: Tricuspid valve endocarditis  Consultants: Infectious disease. Cardiothoracic surgery. Pulmonology  Procedures:  TEE. Chest tube placement. PICC line placement.  Antibiotics: Currently on vancomycin alone.  She was on Teflaro and daptomycin previously.  Subjective/Interval History:   No issues at all , denies any complaints .  Objective:  Vital Signs  Vitals:   12/19/16 1853 12/19/16 2201 12/20/16 0012 12/20/16 0601  BP:  109/69  100/60  Pulse:  98  78  Resp:  20  16  Temp: 98.4 F (36.9 C) 98.2 F (36.8 C)  98.1 F (36.7 C)  TempSrc: Oral Oral  Oral  SpO2:  100%  100%  Weight:   59.2 kg (130 lb 9.6 oz)   Height:       No intake or output data in the 24 hours ending 12/20/16 1746 Filed Weights   12/17/16 0008 12/18/16 0623 12/20/16 0012  Weight: 56.2 kg (124 lb) 57.6 kg (127 lb) 59.2 kg (130 lb 9.6 oz)   GEN : NAD. Abd: NT,ND,BS+. Resp: Normal effort. Clear to auscultation bilaterally. No wheezing, rales or rhonchi. Cardio: His wrist is normal, regular. No S3, S4. Extremities: No edema   Lab Results:  Data Reviewed: I  have personally reviewed following labs and imaging studies  CBC:  Recent Labs Lab 12/17/16 0456  WBC 5.6  HGB 10.8*  HCT 33.4*  MCV 87.4  PLT 219    Basic Metabolic Panel:  Recent Labs Lab 12/15/16 0458 12/17/16 0456  NA 136 135  K 4.0 4.1  CL 103 103  CO2 27 23  GLUCOSE 104* 102*  BUN 19 16  CREATININE 0.84 0.82  CALCIUM 9.2 9.4    GFR: Estimated Creatinine Clearance: 86 mL/min (by C-G formula based on SCr of 0.82 mg/dL).    Radiology Studies: No results found.   Medications:  Scheduled: . buprenorphine-naloxone  2 tablet Sublingual Daily  . busPIRone  10 mg Oral TID  . furosemide  20 mg Oral Once per day on Tue Thu Sat  . gabapentin  400 mg Oral QID  . heparin  5,000 Units Subcutaneous Q8H  . nicotine  14 mg Transdermal Daily  . potassium chloride  40 mEq Oral Daily  . saccharomyces boulardii  250 mg Oral BID   Continuous: . sodium chloride 250 mL (11/28/16 2032)  . vancomycin 750 mg (12/20/16 0541)   WJX:BJYNWGPRN:sodium chloride, acetaminophen, albuterol, docusate sodium, ibuprofen, nicotine polacrilex, ondansetron (ZOFRAN) IV, oxyCODONE, sodium chloride flush  Assessment/Plan:  Principal Problem:   Bacterial endocarditis Active Problems:   IVDU (intravenous drug user)   Polysubstance (including opioids) dependence, daily use (HCC)   Septic pulmonary embolism (HCC)   Septic shock (HCC)   Encounter for central line placement   Respiratory failure (HCC)  MRSA bacteremia   Hypokalemia   Hypophosphatemia   Endocarditis   Thrombocytopenia (HCC)   RUQ pain   Right hip pain   Pneumothorax on right   Tricuspid valve regurgitation, infectious   Pleural effusion on right    MRSA bacteremia/septic shock/tricuspid valve endocarditis/septic emboli to lungs/MRSA empyema: IMPROVED. Patient remains stable. She initially required pressors due to septic shock. Echocardiogram showed tricuspid valve vegetations with moderate TR. She also underwent TEE which  also showed findings suggestive of endocarditis. HIV, hepatitis C and RPR were negative. Patient was seen by infectious disease and cardiothoracic surgery. Patient was initially placed on vancomycin. Due to persistent bacteremia she was started on daptomycin and ceftazidime. These have been discontinued. Only on vancomycin for now. PICC line was placed on 6/9. Vancomycin to continue through July 15. Patient will remain inpatient until then. Remains stable.  Right spontaneous pneumothorax:  Improved. Seen by pulmonology as well as cardiothoracic. Chest tube was placed and then removed. Patient is stable. Patient reassured regarding her symptoms. Incentive spirometry was also recommended. She is saturating normal on room air.  Anxiety Patient has significant anxiety and per family is primary reason for patient's relapse with drug abuse. Patient was started on Xanax in May and on BuSpar and gabapentin. Celexa was attempted, but discontinued per patient request. The Suboxone clinic has recommended no benzodiazepine. She was tapered off of Xanax. Stable.  Acute kidney injury Baseline creatinine was 0.7. Increased to 1.37. Thought to be secondary to dehydration and NSAIDs. Now improved.  Right upper quadrant abdominal pain.  CT scan of the abdomen pelvis did not show any acute abnormalities. Pain thought to be secondary to musculoskeletal in origin. Now resolved.  Ascites/anasarca due to right heart failure. This is thought to be a result of severe TR. Patient was placed on Lasix with improvement.  Splenomegaly. Detected on CT scan. Will need continued monitoring as outpatient.  Normocytic anemia  Hemoglobin stable. Last transfused on 6/5.  Hypokalemia/hypophosphatemia/hypomagnesemia. Resolved.  Thrombocytopenia. Resolved  Heroin abuse Continue Suboxone. Will transition to a drug rehabilitation program once stable. Social worker to provide resources for same.  Tobacco abuse Continue  nicotine patch  DVT Prophylaxis: Subcutaneous heparin    Code Status: Full code  Family Communication: Discussed with the patient  Disposition Plan: Stable.    LOS: 40 days   Efrain Sella  [facility] Pager [phone] 12/20/2016, 5:46 PM  If 7PM-7AM, please contact night-coverage at [email or web address], password [facility]

## 2016-12-21 LAB — VANCOMYCIN, TROUGH: Vancomycin Tr: 16 ug/mL (ref 15–20)

## 2016-12-21 MED ORDER — ALUM HYDROXIDE-MAG CARBONATE 95-358 MG/15ML PO SUSP
15.0000 mL | ORAL | Status: DC | PRN
  Administered 2016-12-21: 15 mL via ORAL
  Filled 2016-12-21 (×2): qty 15

## 2016-12-21 NOTE — Progress Notes (Signed)
Pharmacy Antibiotic Note Savannah Palmer is a 26 y.o. 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 Pharmacy following for vancomycin dosing. 6 week course of Vanc planned thru 12/30/16.  Vancomycin trough of 16 this am is within goal of 15-20. SCr stable   Plan: 1. Continue Vancomycin 750 mg IV q12hrs 2. Vanc troughs at least twice a week while on vancomycin 3. BMET and CBC on Mondays and Thursdays 4. 6 weeks of treatment, through 12/30/2016   Height: 5\' 3"  (160 cm) Weight: 127 lb 6.8 oz (57.8 kg) (standing weight) IBW/kg (Calculated) : 52.4  Temp (24hrs), Avg:98.1 F (36.7 C), Min:97.6 F (36.4 C), Max:98.3 F (36.8 C)   Recent Labs Lab 12/15/16 0458 12/17/16 0456 12/21/16 0506  WBC  --  5.6  --   CREATININE 0.84 0.82  --   VANCOTROUGH  --  12* 16    Estimated Creatinine Clearance: 86 mL/min (by C-G formula based on SCr of 0.82 mg/dL).    No Known Allergies  5/26 Zosyn x 1 5/26 Vanc >> 6/1;   6/8 >> 5/26 Cefepime >> 5/27 5/31 Teflaro >> 6/8 6/2 Daptomycin >> 6/8   5/27: increase vanc to 750 q8 with improved renal function 5/29  VT: 13 (subtherapeutic) on 750mg  q8h prior to 7th dose, inc to 1g q8h 5/31  VT: 19 (therapeutic) - continue 1g q8h 6/11  VT: 38 (accumulated on q8h dose)  VANC HOLD 6/11 VR 19 (t1/2 ~11.5 > reduced to 1000mg  q12) 6/14 VT 23 on 1g q12h > reduced to 750mg  q12h 6/16 VT 17 on 750mg  q12h > no changes 6/22: VT 17 on 750 mg q12h 6/25: VT 21(slightly supratherapeutic) on 750 mg q12h > reduced to 500 mg q12h 6/28: VT 15 mcg/ml on 500 mg q12h 7/2: VT 12 mcg/ml on 750 mg q 12h  7/6 VT = 16 on 750mg  IV q12h > no change  5/26 UCx: NGF 5/26 BCx: 4/4 MRSA 5/26 MRSA PCR: positive  5/27 BCx: 2/2 MRSA  5/28 BCx: 2/2 MRSA 5/28 HIV antibody: non reactive 5/28 HCV RNA: ND 5/29 BCx: MRSA, BCID = MRSA 5/30 @ 0033 BCx: 1/2 MRSA 5/30 @ 1610 BCx: ngF 6/1 pleural fluid Cx: MRSA 6/4 BCx:  ngF  Thank you for allowing pharmacy to be a part of this patient's care.   Toys 'R' UsKimberly Cross Palmer, Pharm.D., BCPS Clinical Pharmacist Pager: [phone] Clinical phone for 12/21/2016 from 8:30-4:00 is [phone]. After 4pm, please call Main Rx [phone]) for assistance. 12/21/2016 1:30 PM

## 2016-12-21 NOTE — Progress Notes (Signed)
[facility] PROGRESS NOTE  Savannah Palmer QQV:[id number] DOB: Aug 04, 1990 DOA: 11/10/2016  PCP: Patient, No Pcp Per  Brief History/Interval Summary: 26 year old Caucasian female with a past medical history of anxiety, depression, Heroin abuse, presented with weakness, cough and chest pain. Patient was initially in septic shock and required pressors. Blood cultures were positive for MRSA. Patient has a history of tricuspid valve endocarditis and was found to have septic emboli throughout her lungs. She also developed spontaneous right-sided pneumothorax requiring chest tube placement. Patient was also seen by cardiothoracic surgery and was not thought to be a candidate for surgical intervention for her endocarditis. She will be in the hospital till she completes her antibiotic treatment, which will be on July 15.  Reason for Visit: Tricuspid valve endocarditis  Consultants: Infectious disease. Cardiothoracic surgery. Pulmonology  Procedures:  TEE. Chest tube placement. PICC line placement.  Antibiotics: Currently on vancomycin alone.  She was on Teflaro and daptomycin previously.  Subjective/Interval History:   Continuous to do better without any complaints or issues stable hemodynamically  Objective:  Vital Signs  Vitals:   12/20/16 0601 12/20/16 2154 12/21/16 0556 12/21/16 0559  BP: 100/60 135/87  100/67  Pulse: 78 (!) 107  86  Resp: 16 18  18   Temp: 98.1 F (36.7 C) 97.6 F (36.4 C)  98.3 F (36.8 C)  TempSrc: Oral Oral  Oral  SpO2: 100% 100%  98%  Weight:   57.8 kg (127 lb 6.8 oz)   Height:        Intake/Output Summary (Last 24 hours) at 12/21/16 1210 Last data filed at 12/20/16 2036  Gross per 24 hour  Intake               10 ml  Output                0 ml  Net               10 ml   Filed Weights   12/18/16 0623 12/20/16 0012 12/21/16 0556  Weight: 57.6 kg (127 lb) 59.2 kg (130 lb 9.6 oz) 57.8 kg (127 lb 6.8 oz)   GEN :Alert awake oriented 3    Abd: Soft nontender nondistended no rebound or rigidity. Resp: Normal effort. Clear to auscultation bilaterally. No wheezing, rales or rhonchi. Cardio: His wrist is normal, regular. No S3, S4. Extremities: No edema   Lab Results:  Data Reviewed: I have personally reviewed following labs and imaging studies  CBC:  Recent Labs Lab 12/17/16 0456  WBC 5.6  HGB 10.8*  HCT 33.4*  MCV 87.4  PLT 219    Basic Metabolic Panel:  Recent Labs Lab 12/15/16 0458 12/17/16 0456  NA 136 135  K 4.0 4.1  CL 103 103  CO2 27 23  GLUCOSE 104* 102*  BUN 19 16  CREATININE 0.84 0.82  CALCIUM 9.2 9.4    GFR: Estimated Creatinine Clearance: 86 mL/min (by C-G formula based on SCr of 0.82 mg/dL).    Radiology Studies: No results found.   Medications:  Scheduled: . buprenorphine-naloxone  2 tablet Sublingual Daily  . busPIRone  10 mg Oral TID  . furosemide  20 mg Oral Once per day on Tue Thu Sat  . gabapentin  400 mg Oral QID  . nicotine  14 mg Transdermal Daily  . potassium chloride  40 mEq Oral Daily  . saccharomyces boulardii  250 mg Oral BID   Continuous: . sodium chloride 250 mL (12/20/16 1806)  .  vancomycin 750 mg (12/21/16 0554)   ZOX:WRUEAVPRN:sodium chloride, acetaminophen, albuterol, docusate sodium, ibuprofen, nicotine polacrilex, ondansetron (ZOFRAN) IV, oxyCODONE, sodium chloride flush  Assessment/Plan:  Principal Problem:   Bacterial endocarditis Active Problems:   IVDU (intravenous drug user)   Polysubstance (including opioids) dependence, daily use (HCC)   Septic pulmonary embolism (HCC)   Septic shock (HCC)   Encounter for central line placement   Respiratory failure (HCC)   MRSA bacteremia   Hypokalemia   Hypophosphatemia   Endocarditis   Thrombocytopenia (HCC)   RUQ pain   Right hip pain   Pneumothorax on right   Tricuspid valve regurgitation, infectious   Pleural effusion on right    MRSA bacteremia/septic shock/tricuspid valve endocarditis/septic  emboli to lungs/MRSA empyema: IMPROVED. Patient remains stable. She initially required pressors due to septic shock. Echocardiogram showed tricuspid valve vegetations with moderate TR. She also underwent TEE which also showed findings suggestive of endocarditis. HIV, hepatitis C and RPR were negative. Patient was seen by infectious disease and cardiothoracic surgery. Patient was initially placed on vancomycin. Due to persistent bacteremia she was started on daptomycin and ceftazidime. These have been discontinued. Only on vancomycin for now. PICC line was placed on 6/9. Vancomycin to continue through July 15. Patient will remain inpatient until then. Remains stable. No change in the plan continue to monitor  Right spontaneous pneumothorax:  Improved. Seen by pulmonology as well as cardiothoracic. Chest tube was placed and then removed. Patient is stable. Patient reassured regarding her symptoms. Incentive spirometry was also recommended. She is saturating normal on room air.  Anxiety Patient has significant anxiety and per family is primary reason for patient's relapse with drug abuse. Patient was started on Xanax in May and on BuSpar and gabapentin. Celexa was attempted, but discontinued per patient request. The Suboxone clinic has recommended no benzodiazepine. She was tapered off of Xanax. Stable.  Acute kidney injury Baseline creatinine was 0.7. Increased to 1.37. Thought to be secondary to dehydration and NSAIDs. Now improved.  Right upper quadrant abdominal pain.  CT scan of the abdomen pelvis did not show any acute abnormalities. Pain thought to be secondary to musculoskeletal in origin. Now resolved.  Ascites/anasarca due to right heart failure. This is thought to be a result of severe TR. Patient was placed on Lasix with improvement.  Splenomegaly. Detected on CT scan. Will need continued monitoring as outpatient.  Normocytic anemia  Hemoglobin stable. Last transfused on  6/5.  Hypokalemia/hypophosphatemia/hypomagnesemia. Resolved.  Thrombocytopenia. Resolved  Heroin abuse Continue Suboxone. Will transition to a drug rehabilitation program once stable. Social worker to provide resources for same.  Tobacco abuse Continue nicotine patch  DVT Prophylaxis: Subcutaneous heparin    Code Status: Full code  Family Communication: Discussed with the patient  Disposition Plan: Stable.    LOS: 41 days   Savannah Palmer  [facility] Pager [phone] 12/21/2016, 12:10 PM  If 7PM-7AM, please contact night-coverage at [email or web address], password [email or web address]

## 2016-12-22 NOTE — Progress Notes (Signed)
[facility] PROGRESS NOTE  LUNDON ROSIER ZOX:[id number] DOB: [birth year] DOA: 11/10/2016  PCP: Patient, No Pcp Per  Brief History/Interval Summary: 26 year old Caucasian female with a past medical history of anxiety, depression, Heroin abuse, presented with weakness, cough and chest pain. Patient was initially in septic shock and required pressors. Blood cultures were positive for MRSA. Patient has a history of tricuspid valve endocarditis and was found to have septic emboli throughout her lungs. She also developed spontaneous right-sided pneumothorax requiring chest tube placement. Patient was also seen by cardiothoracic surgery and was not thought to be a candidate for surgical intervention for her endocarditis. She will be in the hospital till she completes her antibiotic treatment, which will be on July 15.  Reason for Visit: Tricuspid valve endocarditis  Consultants: Infectious disease. Cardiothoracic surgery. Pulmonology  Procedures:  TEE. Chest tube placement. PICC line placement.  Antibiotics: Currently on vancomycin alone.  She was on Teflaro and daptomycin previously.  Subjective/Interval History:   The patient has no complaints continuous to do better stable without any issues.  Objective:  Vital Signs  Vitals:   12/21/16 0559 12/21/16 1315 12/21/16 2117 12/22/16 0520  BP: 100/67 103/71 107/63 101/67  Pulse: 86 97 (!) 119 93  Resp: 18 16 18 18   Temp: 98.3 F (36.8 C) 98.3 F (36.8 C) 98.2 F (36.8 C) 98 F (36.7 C)  TempSrc: Oral Oral  Oral  SpO2: 98% 100% 99% 99%  Weight:      Height:        Intake/Output Summary (Last 24 hours) at 12/22/16 1249 Last data filed at 12/22/16 0553  Gross per 24 hour  Intake              310 ml  Output                0 ml  Net              310 ml   Filed Weights   12/18/16 0623 12/20/16 0012 12/21/16 0556  Weight: 57.6 kg (127 lb) 59.2 kg (130 lb 9.6 oz) 57.8 kg (127 lb 6.8 oz)   GEN :AAAO*3 . Abd: Soft  nontender nondistended no rebound or rigidity. Resp: Normal effort. Clear to auscultation bilaterally. No wheezing, rales or rhonchi. Cardio: His wrist is normal, regular. No S3, S4. Extremities: No edema   Lab Results:  Data Reviewed: I have personally reviewed following labs and imaging studies  CBC:  Recent Labs Lab 12/17/16 0456  WBC 5.6  HGB 10.8*  HCT 33.4*  MCV 87.4  PLT 219    Basic Metabolic Panel:  Recent Labs Lab 12/17/16 0456  NA 135  K 4.1  CL 103  CO2 23  GLUCOSE 102*  BUN 16  CREATININE 0.82  CALCIUM 9.4    GFR: Estimated Creatinine Clearance: 86 mL/min (by C-G formula based on SCr of 0.82 mg/dL).    Radiology Studies: No results found.   Medications:  Scheduled: . buprenorphine-naloxone  2 tablet Sublingual Daily  . busPIRone  10 mg Oral TID  . furosemide  20 mg Oral Once per day on Tue Thu Sat  . gabapentin  400 mg Oral QID  . nicotine  14 mg Transdermal Daily  . potassium chloride  40 mEq Oral Daily  . saccharomyces boulardii  250 mg Oral BID   Continuous: . sodium chloride 250 mL (12/20/16 1806)  . vancomycin Stopped (12/22/16 0703)   WJX:BJYNWG chloride, acetaminophen, albuterol, aluminum hydroxide-magnesium carbonate, docusate sodium, ibuprofen,  nicotine polacrilex, ondansetron (ZOFRAN) IV, oxyCODONE, sodium chloride flush  Assessment/Plan:  Principal Problem:   Bacterial endocarditis Active Problems:   IVDU (intravenous drug user)   Polysubstance (including opioids) dependence, daily use (HCC)   Septic pulmonary embolism (HCC)   Septic shock (HCC)   Encounter for central line placement   Respiratory failure (HCC)   MRSA bacteremia   Hypokalemia   Hypophosphatemia   Endocarditis   Thrombocytopenia (HCC)   RUQ pain   Right hip pain   Pneumothorax on right   Tricuspid valve regurgitation, infectious   Pleural effusion on right    MRSA bacteremia/septic shock/tricuspid valve endocarditis/septic emboli to  lungs/MRSA empyema: IMPROVED. Patient remains stable. She initially required pressors due to septic shock. Echocardiogram showed tricuspid valve vegetations with moderate TR. She also underwent TEE which also showed findings suggestive of endocarditis. HIV, hepatitis C and RPR were negative. Patient was seen by infectious disease and cardiothoracic surgery. Patient was initially placed on vancomycin. Due to persistent bacteremia she was started on daptomycin and ceftazidime. These have been discontinued. Only on vancomycin for now. PICC line was placed on 6/9. Vancomycin to continue through July 15. Patient will remain inpatient until then. Remains stable.  Vanc trough biweekly per the pharmacy .  Right spontaneous pneumothorax:  Improved. Seen by pulmonology as well as cardiothoracic. Chest tube was placed and then removed. Patient is stable. Patient reassured regarding her symptoms. Incentive spirometry was also recommended. She is saturating normal on room air.  Anxiety Patient has significant anxiety and per family is primary reason for patient's relapse with drug abuse. Patient was started on Xanax in May and on BuSpar and gabapentin. Celexa was attempted, but discontinued per patient request. The Suboxone clinic has recommended no benzodiazepine. She was tapered off of Xanax. Stable.  Acute kidney injury Baseline creatinine was 0.7. Increased to 1.37. Thought to be secondary to dehydration and NSAIDs. Now improved.  Right upper quadrant abdominal pain.  CT scan of the abdomen pelvis did not show any acute abnormalities. Pain thought to be secondary to musculoskeletal in origin. Now resolved.  Ascites/anasarca due to right heart failure. This is thought to be a result of severe TR. Patient was placed on Lasix with improvement.  Splenomegaly. Detected on CT scan. Will need continued monitoring as outpatient.  Normocytic anemia  Hemoglobin stable. Last transfused on  6/5.  Hypokalemia/hypophosphatemia/hypomagnesemia. Resolved.  Thrombocytopenia. Resolved  Heroin abuse Continue Suboxone. Will transition to a drug rehabilitation program once stable. Social worker to provide resources for same.  Tobacco abuse Continue nicotine patch  DVT Prophylaxis: Subcutaneous heparin    Code Status: Full code  Family Communication: Discussed with the patient  Disposition Plan: Stable.    LOS: 42 days   Efrain Sellaiad Gayatri Teasdale  [facility] Pager [phone] 12/22/2016, 12:49 PM  If 7PM-7AM, please contact night-coverage at [email or web address], password [email or web address]

## 2016-12-23 NOTE — Progress Notes (Signed)
[facility] PROGRESS NOTE  [name] UYQ:[id number] DOB: 10-Apr-1991 DOA: 11/10/2016  PCP: Patient, No Pcp Per  Brief History/Interval Summary: 26 year old Caucasian female with a past medical history of anxiety, depression, Heroin abuse, presented with weakness, cough and chest pain. Patient was initially in septic shock and required pressors. Blood cultures were positive for MRSA. Patient has a history of tricuspid valve endocarditis and was found to have septic emboli throughout her lungs. She also developed spontaneous right-sided pneumothorax requiring chest tube placement. Patient was also seen by cardiothoracic surgery and was not thought to be a candidate for surgical intervention for her endocarditis. She will be in the hospital till she completes her antibiotic treatment, which will be on July 15.  Reason for Visit: Tricuspid valve endocarditis  Consultants: Infectious disease. Cardiothoracic surgery. Pulmonology  Procedures:  TEE. Chest tube placement. PICC line placement.  Antibiotics: Currently on vancomycin alone.  She was on Teflaro and daptomycin previously.  Subjective/Interval History:   She continues with no complaints, had no issues today at all.  Objective:  Vital Signs  Vitals:   12/22/16 0520 12/22/16 1639 12/22/16 2146 12/23/16 0604  BP: 101/67 107/65 102/66 (!) 101/58  Pulse: 93 100 95 92  Resp: 18 20 18 18   Temp: 98 F (36.7 C) 98.4 F (36.9 C) 98 F (36.7 C) 98.2 F (36.8 C)  TempSrc: Oral Oral  Oral  SpO2: 99% 96% 97% 100%  Weight:    58 kg (127 lb 12.8 oz)  Height:        Intake/Output Summary (Last 24 hours) at 12/23/16 1242 Last data filed at 12/23/16 [id number]  Gross per 24 hour  Intake              390 ml  Output                0 ml  Net              390 ml   Filed Weights   12/20/16 0012 12/21/16 0556 12/23/16 0604  Weight: 59.2 kg (130 lb 9.6 oz) 57.8 kg (127 lb 6.8 oz) 58 kg (127 lb 12.8 oz)   GEN :NAD.  Abd:  Soft nontender nondistended no rebound or rigidity. Resp: CTAB.Marland Kitchen. No wheezing, rales or rhonchi. Cardio: His wrist is normal, regular. No S3, S4. Extremities: No edema   Lab Results:  Data Reviewed: I have personally reviewed following labs and imaging studies  CBC:  Recent Labs Lab 12/17/16 0456  WBC 5.6  HGB 10.8*  HCT 33.4*  MCV 87.4  PLT 219    Basic Metabolic Panel:  Recent Labs Lab 12/17/16 0456  NA 135  K 4.1  CL 103  CO2 23  GLUCOSE 102*  BUN 16  CREATININE 0.82  CALCIUM 9.4    GFR: Estimated Creatinine Clearance: 86 mL/min (by C-G formula based on SCr of 0.82 mg/dL).    Radiology Studies: No results found.   Medications:  Scheduled: . buprenorphine-naloxone  2 tablet Sublingual Daily  . busPIRone  10 mg Oral TID  . furosemide  20 mg Oral Once per day on Tue Thu Sat  . gabapentin  400 mg Oral QID  . nicotine  14 mg Transdermal Daily  . potassium chloride  40 mEq Oral Daily  . saccharomyces boulardii  250 mg Oral BID   Continuous: . sodium chloride 250 mL (12/20/16 1806)  . vancomycin Stopped (12/23/16 [id number])   PIR:JJOACZPRN:sodium chloride, acetaminophen, albuterol, aluminum hydroxide-magnesium carbonate, docusate sodium, ibuprofen,  nicotine polacrilex, ondansetron (ZOFRAN) IV, oxyCODONE, sodium chloride flush  Assessment/Plan:  Principal Problem:   Bacterial endocarditis Active Problems:   IVDU (intravenous drug user)   Polysubstance (including opioids) dependence, daily use (HCC)   Septic pulmonary embolism (HCC)   Septic shock (HCC)   Encounter for central line placement   Respiratory failure (HCC)   MRSA bacteremia   Hypokalemia   Hypophosphatemia   Endocarditis   Thrombocytopenia (HCC)   RUQ pain   Right hip pain   Pneumothorax on right   Tricuspid valve regurgitation, infectious   Pleural effusion on right    MRSA bacteremia/septic shock/tricuspid valve endocarditis/septic emboli to lungs/MRSA empyema: IMPROVED. Patient remains  stable. She initially required pressors due to septic shock. Echocardiogram showed tricuspid valve vegetations with moderate TR. She also underwent TEE which also showed findings suggestive of endocarditis. HIV, hepatitis C and RPR were negative. Patient was seen by infectious disease and cardiothoracic surgery. Patient was initially placed on vancomycin. Due to persistent bacteremia she was started on daptomycin and ceftazidime. These have been discontinued. Only on vancomycin for now. PICC line was placed on 6/9. Vancomycin to continue through July 15. Patient will remain inpatient until then. Remains stable.  Vanc trough biweekly per the pharmacy .  Right spontaneous pneumothorax:  Improved. Seen by pulmonology as well as cardiothoracic. Chest tube was placed and then removed. Patient is stable. Patient reassured regarding her symptoms. Incentive spirometry was also recommended. She is saturating normal on room air.  Anxiety Patient has significant anxiety and per family is primary reason for patient's relapse with drug abuse. Patient was started on Xanax in May and on BuSpar and gabapentin. Celexa was attempted, but discontinued per patient request. The Suboxone clinic has recommended no benzodiazepine. She was tapered off of Xanax. Stable.  Acute kidney injury Baseline creatinine was 0.7. Increased to 1.37. Thought to be secondary to dehydration and NSAIDs. Now improved.  Right upper quadrant abdominal pain.  CT scan of the abdomen pelvis did not show any acute abnormalities. Pain thought to be secondary to musculoskeletal in origin. Now resolved.  Ascites/anasarca due to right heart failure. This is thought to be a result of severe TR. Patient was placed on Lasix with improvement.  Splenomegaly. Detected on CT scan. Will need continued monitoring as outpatient.  Normocytic anemia  Hemoglobin stable. Last transfused on  6/5.  Hypokalemia/hypophosphatemia/hypomagnesemia. Resolved.  Thrombocytopenia. Resolved  Heroin abuse Continue Suboxone. Will transition to a drug rehabilitation program once stable. Social worker to provide resources for same.  Tobacco abuse Continue nicotine patch  DVT Prophylaxis: Subcutaneous heparin    Code Status: Full code  Family Communication: Discussed with the patient  Disposition Plan: Stable.    LOS: 43 days   Efrain Sella  [phone]    [facility] Pager [phone] 12/23/2016, 12:42 PM  If 7PM-7AM, please contact night-coverage at [email or web address], password [facility]

## 2016-12-24 LAB — VANCOMYCIN, TROUGH: Vancomycin Tr: 15 ug/mL (ref 15–20)

## 2016-12-24 LAB — CBC
HCT: 31.1 % — ABNORMAL LOW (ref 36.0–46.0)
HEMOGLOBIN: 10.4 g/dL — AB (ref 12.0–15.0)
MCH: 29.2 pg (ref 26.0–34.0)
MCHC: 33.4 g/dL (ref 30.0–36.0)
MCV: 87.4 fL (ref 78.0–100.0)
PLATELETS: 188 10*3/uL (ref 150–400)
RBC: 3.56 MIL/uL — AB (ref 3.87–5.11)
RDW: 15.1 % (ref 11.5–15.5)
WBC: 5.6 10*3/uL (ref 4.0–10.5)

## 2016-12-24 LAB — BASIC METABOLIC PANEL
ANION GAP: 5 (ref 5–15)
BUN: 12 mg/dL (ref 6–20)
CALCIUM: 9.3 mg/dL (ref 8.9–10.3)
CO2: 26 mmol/L (ref 22–32)
Chloride: 103 mmol/L (ref 101–111)
Creatinine, Ser: 0.77 mg/dL (ref 0.44–1.00)
GFR calc Af Amer: >60 mL/min (ref >60)
GFR calc non Af Amer: >60 mL/min (ref >60)
Glucose, Bld: 102 mg/dL — ABNORMAL HIGH (ref 65–99)
POTASSIUM: 3.9 mmol/L (ref 3.5–5.1)
Sodium: 134 mmol/L — ABNORMAL LOW (ref 135–145)

## 2016-12-24 NOTE — Progress Notes (Signed)
[facility] PROGRESS NOTE  HELIA HAESE DGU:[id number] DOB: Jan 13, 1991 DOA: 11/10/2016  PCP: Patient, No Pcp Per  Brief History/Interval Summary: 26 year old Caucasian female with a past medical history of anxiety, depression, Heroin abuse, presented with weakness, cough and chest pain. Patient was initially in septic shock and required pressors. Blood cultures were positive for MRSA. Patient has a history of tricuspid valve endocarditis and was found to have septic emboli throughout her lungs. She also developed spontaneous right-sided pneumothorax requiring chest tube placement. Patient was also seen by cardiothoracic surgery and was not thought to be a candidate for surgical intervention for her endocarditis. She will be in the hospital till she completes her antibiotic treatment, which will be on July 15.  Reason for Visit: Tricuspid valve endocarditis  Consultants: Infectious disease. Cardiothoracic surgery. Pulmonology  Procedures:  TEE. Chest tube placement. PICC line placement.  Antibiotics: Currently on vancomycin alone.  She was on Teflaro and daptomycin previously.  Subjective/Interval History:   No issues or complaints , stable .  Objective:  Vital Signs  Vitals:   12/23/16 1618 12/23/16 2226 12/24/16 0240 12/24/16 0542  BP: 104/61 (!) 102/59  100/65  Pulse: 94 89  82  Resp: 16 18  17   Temp: 98.2 F (36.8 C) 98.2 F (36.8 C)  (!) 97.5 F (36.4 C)  TempSrc:    Oral  SpO2: 100% 100%  100%  Weight:   60.3 kg (133 lb)   Height:        Intake/Output Summary (Last 24 hours) at 12/24/16 1330 Last data filed at 12/24/16 0957  Gross per 24 hour  Intake              510 ml  Output                0 ml  Net              510 ml   Filed Weights   12/21/16 0556 12/23/16 0604 12/24/16 0240  Weight: 57.8 kg (127 lb 6.8 oz) 58 kg (127 lb 12.8 oz) 60.3 kg (133 lb)   GEN : NAD , alert and awake and oriented .  Abd: Soft nontender nondistended no rebound  or rigidity. Resp: CTAB.Marland Kitchen No wheezing, rales or rhonchi. Cardio: His wrist is normal, regular. No S3, S4. Extremities: No edema   Lab Results:  Data Reviewed: I have personally reviewed following labs and imaging studies  CBC:  Recent Labs Lab 12/24/16 0503  WBC 5.6  HGB 10.4*  HCT 31.1*  MCV 87.4  PLT 188    Basic Metabolic Panel:  Recent Labs Lab 12/24/16 0503  NA 134*  K 3.9  CL 103  CO2 26  GLUCOSE 102*  BUN 12  CREATININE 0.77  CALCIUM 9.3    GFR: Estimated Creatinine Clearance: 88.2 mL/min (by C-G formula based on SCr of 0.77 mg/dL).    Radiology Studies: No results found.   Medications:  Scheduled: . buprenorphine-naloxone  2 tablet Sublingual Daily  . busPIRone  10 mg Oral TID  . furosemide  20 mg Oral Once per day on Tue Thu Sat  . gabapentin  400 mg Oral QID  . nicotine  14 mg Transdermal Daily  . potassium chloride  40 mEq Oral Daily  . saccharomyces boulardii  250 mg Oral BID   Continuous: . sodium chloride 250 mL (12/20/16 1806)  . vancomycin Stopped (12/24/16 0751)   ZDG:LOVFIE chloride, acetaminophen, albuterol, aluminum hydroxide-magnesium carbonate, docusate sodium, ibuprofen, nicotine polacrilex,  ondansetron (ZOFRAN) IV, oxyCODONE, sodium chloride flush  Assessment/Plan:  Principal Problem:   Bacterial endocarditis Active Problems:   IVDU (intravenous drug user)   Polysubstance (including opioids) dependence, daily use (HCC)   Septic pulmonary embolism (HCC)   Septic shock (HCC)   Encounter for central line placement   Respiratory failure (HCC)   MRSA bacteremia   Hypokalemia   Hypophosphatemia   Endocarditis   Thrombocytopenia (HCC)   RUQ pain   Right hip pain   Pneumothorax on right   Tricuspid valve regurgitation, infectious   Pleural effusion on right    MRSA bacteremia/septic shock/tricuspid valve endocarditis/septic emboli to lungs/MRSA empyema: IMPROVED. Patient remains stable. She initially required  pressors due to septic shock. Echocardiogram showed tricuspid valve vegetations with moderate TR. She also underwent TEE which also showed findings suggestive of endocarditis. HIV, hepatitis C and RPR were negative. Patient was seen by infectious disease and cardiothoracic surgery. Patient was initially placed on vancomycin. Due to persistent bacteremia she was started on daptomycin and ceftazidime. These have been discontinued. Only on vancomycin for now. PICC line was placed on 6/9. Vancomycin to continue through July 15. Patient will remain inpatient until then. Remains stable.  Vanc trough biweekly per the pharmacy .  Right spontaneous pneumothorax:  Improved. Seen by pulmonology as well as cardiothoracic. Chest tube was placed and then removed. Patient is stable. Patient reassured regarding her symptoms. Incentive spirometry was also recommended. She is saturating normal on room air.  Anxiety Patient has significant anxiety and per family is primary reason for patient's relapse with drug abuse. Patient was started on Xanax in May and on BuSpar and gabapentin. Celexa was attempted, but discontinued per patient request. The Suboxone clinic has recommended no benzodiazepine. She was tapered off of Xanax. Stable.  Acute kidney injury Baseline creatinine was 0.7. Increased to 1.37. Thought to be secondary to dehydration and NSAIDs. Now improved.  Right upper quadrant abdominal pain.  CT scan of the abdomen pelvis did not show any acute abnormalities. Pain thought to be secondary to musculoskeletal in origin. Now resolved.  Ascites/anasarca due to right heart failure. This is thought to be a result of severe TR. Patient was placed on Lasix with improvement.  Splenomegaly. Detected on CT scan. Will need continued monitoring as outpatient.  Normocytic anemia  Hemoglobin stable. Last transfused on  6/5.  Hypokalemia/hypophosphatemia/hypomagnesemia. Resolved.  Thrombocytopenia. Resolved  Heroin abuse Continue Suboxone. Will transition to a drug rehabilitation program once stable. Social worker to provide resources for same.  Tobacco abuse Continue nicotine patch  DVT Prophylaxis: Subcutaneous heparin    Code Status: Full code  Family Communication: Discussed with the patient  Disposition Plan: Stable.    LOS: 44 days   Efrain Sellaiad Trinton Prewitt  [phone]    [facility] Pager [phone] 12/24/2016, 1:30 PM  If 7PM-7AM, please contact night-coverage at [email or web address], password [email or web address]

## 2016-12-24 NOTE — Progress Notes (Signed)
Pharmacy Antibiotic Note Savannah Palmer is a 26 y.o. 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 Pharmacy following for vancomycin dosing. 6 week course of Vanc planned thru 12/30/16.  Vancomycin trough of 15 this am is within goal of 15-20. SCr stable   Plan: 1. Continue Vancomycin 750 mg IV q12hrs 2. Vanc troughs at least twice a week while on vancomycin 3. BMET and CBC on Mondays and Thursdays 4. 6 weeks of treatment, through 12/30/2016   Height: 5\' 3"  (160 cm) Weight: 133 lb (60.3 kg) IBW/kg (Calculated) : 52.4  Temp (24hrs), Avg:98 F (36.7 C), Min:97.5 F (36.4 C), Max:98.2 F (36.8 C)   Recent Labs Lab 12/21/16 0506 12/24/16 0503  WBC  --  5.6  CREATININE  --  0.77  VANCOTROUGH 16 15    Estimated Creatinine Clearance: 88.2 mL/min (by C-G formula based on SCr of 0.77 mg/dL).    No Known Allergies  5/26 Zosyn x 1 5/26 Vanc >> 6/1;   6/8 >> 5/26 Cefepime >> 5/27 5/31 Teflaro >> 6/8 6/2 Daptomycin >> 6/8   5/27: increase vanc to 750 q8 with improved renal function 5/29  VT: 13 (subtherapeutic) on 750mg  q8h prior to 7th dose, inc to 1g q8h 5/31  VT: 19 (therapeutic) - continue 1g q8h 6/11  VT: 38 (accumulated on q8h dose)  VANC HOLD 6/11 VR 19 (t1/2 ~11.5 > reduced to 1000mg  q12) 6/14 VT 23 on 1g q12h > reduced to 750mg  q12h 6/16 VT 17 on 750mg  q12h > no changes 6/22: VT 17 on 750 mg q12h 6/25: VT 21(slightly supratherapeutic) on 750 mg q12h > reduced to 500 mg q12h 6/28: VT 15 mcg/ml on 500 mg q12h 7/2: VT 12 mcg/ml on 750 mg q 12h  7/6 VT = 16 on 750mg  IV q12h > no change 7/9 VT = 15 on 750mg  IV q12h > no change  5/26 UCx: NGF 5/26 BCx: 4/4 MRSA 5/26 MRSA PCR: positive  5/27 BCx: 2/2 MRSA  5/28 BCx: 2/2 MRSA 5/28 HIV antibody: non reactive 5/28 HCV RNA: ND 5/29 BCx: MRSA, BCID = MRSA 5/30 @ 0033 BCx: 1/2 MRSA 5/30 @ 1610 BCx: ngF 6/1 pleural fluid Cx: MRSA 6/4 BCx: ngF  Thank you for  allowing pharmacy to be a part of this patient's care.   Toys 'R' UsKimberly Quintan Saldivar, Pharm.D., BCPS Clinical Pharmacist Pager: [phone] Clinical phone for 12/24/2016 from 8:30-4:00 is [phone]. After 4pm, please call Main Rx [phone]) for assistance. 12/24/2016 1:19 PM

## 2016-12-25 NOTE — Progress Notes (Signed)
[facility] PROGRESS NOTE  [name] VHQ:[id number] DOB: 12/25/1990 DOA: 11/10/2016  PCP: Patient, No Pcp Per  Brief History/Interval Summary: 26 year old Caucasian female with a past medical history of anxiety, depression, Heroin abuse, presented with weakness, cough and chest pain. Patient was initially in septic shock and required pressors. Blood cultures were positive for MRSA. Patient has a history of tricuspid valve endocarditis and was found to have septic emboli throughout her lungs. She also developed spontaneous right-sided pneumothorax requiring chest tube placement. Patient was also seen by cardiothoracic surgery and was not thought to be a candidate for surgical intervention for her endocarditis. She will be in the hospital till she completes her antibiotic treatment, which will be on July 15.  Reason for Visit: Tricuspid valve endocarditis  Consultants: Infectious disease. Cardiothoracic surgery. Pulmonology  Procedures:  TEE. Chest tube placement. PICC line placement.  Antibiotics: Currently on vancomycin alone.  She was on Teflaro and daptomycin previously.  Subjective/Interval History:   No issues or complaints at all , doing great , no physical complaints .  Objective:  Vital Signs  Vitals:   12/24/16 0542 12/24/16 1511 12/24/16 2121 12/25/16 0512  BP: 100/65 115/66 120/69 99/68  Pulse: 82 94 93 93  Resp: 17  18 19   Temp: (!) 97.5 F (36.4 C) 98.4 F (36.9 C) 97.9 F (36.6 C) 98 F (36.7 C)  TempSrc: Oral Oral Oral Oral  SpO2: 100% 100% 99% 97%  Weight:    58.3 kg (128 lb 8 oz)  Height:        Intake/Output Summary (Last 24 hours) at 12/25/16 1309 Last data filed at 12/25/16 0932  Gross per 24 hour  Intake              490 ml  Output                0 ml  Net              490 ml   Filed Weights   12/23/16 0604 12/24/16 0240 12/25/16 0512  Weight: 58 kg (127 lb 12.8 oz) 60.3 kg (133 lb) 58.3 kg (128 lb 8 oz)   GEN : NAD , alert  and awake and oriented .  Abd: S,NT,ND. Resp: CTAB.Marland Kitchen. No wheezing, rales or rhonchi. Cardio: His wrist is normal, regular. No S3, S4. Extremities: No edema   Lab Results:  Data Reviewed: I have personally reviewed following labs and imaging studies  CBC:  Recent Labs Lab 12/24/16 0503  WBC 5.6  HGB 10.4*  HCT 31.1*  MCV 87.4  PLT 188    Basic Metabolic Panel:  Recent Labs Lab 12/24/16 0503  NA 134*  K 3.9  CL 103  CO2 26  GLUCOSE 102*  BUN 12  CREATININE 0.77  CALCIUM 9.3    GFR: Estimated Creatinine Clearance: 88.2 mL/min (by C-G formula based on SCr of 0.77 mg/dL).    Radiology Studies: No results found.   Medications:  Scheduled: . buprenorphine-naloxone  2 tablet Sublingual Daily  . busPIRone  10 mg Oral TID  . furosemide  20 mg Oral Once per day on Tue Thu Sat  . gabapentin  400 mg Oral QID  . nicotine  14 mg Transdermal Daily  . potassium chloride  40 mEq Oral Daily  . saccharomyces boulardii  250 mg Oral BID   Continuous: . sodium chloride 250 mL (12/20/16 1806)  . vancomycin Stopped (12/25/16 0727)   UXL:KGMWNUPRN:sodium chloride, acetaminophen, albuterol, aluminum hydroxide-magnesium carbonate,  docusate sodium, ibuprofen, nicotine polacrilex, ondansetron (ZOFRAN) IV, oxyCODONE, sodium chloride flush  Assessment/Plan:  Principal Problem:   Bacterial endocarditis Active Problems:   IVDU (intravenous drug user)   Polysubstance (including opioids) dependence, daily use (HCC)   Septic pulmonary embolism (HCC)   Septic shock (HCC)   Encounter for central line placement   Respiratory failure (HCC)   MRSA bacteremia   Hypokalemia   Hypophosphatemia   Endocarditis   Thrombocytopenia (HCC)   RUQ pain   Right hip pain   Pneumothorax on right   Tricuspid valve regurgitation, infectious   Pleural effusion on right    MRSA bacteremia/septic shock/tricuspid valve endocarditis/septic emboli to lungs/MRSA empyema: IMPROVED. Patient remains stable.  She initially required pressors due to septic shock. Echocardiogram showed tricuspid valve vegetations with moderate TR. She also underwent TEE which also showed findings suggestive of endocarditis. HIV, hepatitis C and RPR were negative. Patient was seen by infectious disease and cardiothoracic surgery. Patient was initially placed on vancomycin. Due to persistent bacteremia she was started on daptomycin and ceftazidime. These have been discontinued. Only on vancomycin for now. PICC line was placed on 6/9. Vancomycin to continue through July 15. Patient will remain inpatient until then. Remains stable.  Vanc trough biweekly per the pharmacy .  Right spontaneous pneumothorax:  Improved. Seen by pulmonology as well as cardiothoracic. Chest tube was placed and then removed. Patient is stable. Patient reassured regarding her symptoms. Incentive spirometry was also recommended. She is saturating normal on room air.  Anxiety Patient has significant anxiety and per family is primary reason for patient's relapse with drug abuse. Patient was started on Xanax in May and on BuSpar and gabapentin. Celexa was attempted, but discontinued per patient request. The Suboxone clinic has recommended no benzodiazepine. She was tapered off of Xanax. Stable.  Acute kidney injury Baseline creatinine was 0.7. Increased to 1.37. Thought to be secondary to dehydration and NSAIDs. Now improved.  Right upper quadrant abdominal pain.  CT scan of the abdomen pelvis did not show any acute abnormalities. Pain thought to be secondary to musculoskeletal in origin. Now resolved.  Ascites/anasarca due to right heart failure. This is thought to be a result of severe TR. Patient was placed on Lasix with improvement.  Splenomegaly. Detected on CT scan. Will need continued monitoring as outpatient.  Normocytic anemia  Hemoglobin stable. Last transfused on  6/5.  Hypokalemia/hypophosphatemia/hypomagnesemia. Resolved.  Thrombocytopenia. Resolved  Heroin abuse Continue Suboxone. Will transition to a drug rehabilitation program once stable. Social worker to provide resources for same.  Tobacco abuse Continue nicotine patch  DVT Prophylaxis: Subcutaneous heparin    Code Status: Full code  Family Communication: Discussed with the patient  Disposition Plan: Stable.    LOS: 45 days   Efrain Sella  [phone]    [facility] Pager [phone] 12/25/2016, 1:09 PM  If 7PM-7AM, please contact night-coverage at [email or web address], password [facility]

## 2016-12-26 NOTE — Progress Notes (Signed)
Savannah Palmer   ZHY:[id number]  DOB: 1990/12/16  DOA: 11/10/2016 PCP: Patient, No Pcp Per   Brief Narrative:  26 year old Caucasian female with a past medical history of anxiety, depression, Heroin abuse, presented with weakness, cough and chest pain. Patient was initially in septic shock and required pressors. Blood cultures were positive for MRSA. Patient has a history of tricuspid valve endocarditis and was found to have septic emboli throughout her lungs. She also developed spontaneous right-sided pneumothorax requiring chest tube placement. Patient was also seen by cardiothoracic surgery and was not thought to be a candidate for surgical intervention for her endocarditis. She will be in the hospital till she completes her antibiotic treatment, which will be on July 15.  Subjective: No complaints of pain, nausea, vomiting, diarrhea, cough, congestion or dysuria. No other complaints.   Assessment & Plan:   MRSA bacteremia/septic shock/tricuspid valve endocarditis/septic emboli to lungs/MRSA empyema: IMPROVED. Patient remains stable. She initially required pressors due to septic shock. Echocardiogram showed tricuspid valve vegetations with moderate TR. She also underwent TEE which also showed findings suggestive of endocarditis. HIV, hepatitis C and RPR were negative. Patient was seen by infectious disease and cardiothoracic surgery. Patient was initially placed on vancomycin. Due to persistent bacteremia she was started on daptomycin and ceftazidime. These have been discontinued. Only on vancomycin for now. PICC line was placed on 6/9.  Vancomycin to continue through July 15 after which she can be discharged home.    Right spontaneous pneumothorax: - occurring after TEE - Seen by pulmonology as well as cardiothoracic. Chest tube was placed and then removed. Patient is stable. Patient reassured regarding her symptoms. Incentive spirometry was also recommended. She is  saturating normal on room air.  Anxiety Patient has significant anxiety and per family is primary reason for patient's relapse with drug abuse. Patient was started on Xanax in May and on BuSpar and gabapentin. Celexa was attempted, but discontinued per patient request. The Suboxone clinic has recommended no benzodiazepine. She was tapered off of Xanax. Stable.  Acute kidney injury Baseline creatinine was 0.7. Increased to 1.37. Thought to be secondary to dehydration and NSAIDs. Now improved.  Right upper quadrant abdominal pain.  CT scan of the abdomen pelvis did not show any acute abnormalities. Pain thought to be secondary to musculoskeletal in origin. Now resolved.  Ascites/anasarca due to right heart failure. This is thought to be a result of severe TR. Patient was placed on Lasix with improvement.  Splenomegaly. Detected on CT scan. Will need continued monitoring as outpatient.  Normocytic anemia  Hemoglobin stable. Last transfused on 6/5.  Hypokalemia/hypophosphatemia/hypomagnesemia. Resolved.  Thrombocytopenia. Resolved  Heroin abuse Continue Suboxone. Will transition to a drug rehabilitation program once stable. Social worker to provide resources for same.  Tobacco abuse Continue nicotine patch  DVT Prophylaxis: Subcutaneous heparin    Code Status: Full code  Family Communication: Discussed with the patient  Disposition Plan: Stable. Consultants:  Infectious disease. Cardiothoracic surgery. Pulmonology Procedures:  TEE. Chest tube placement. PICC line placement.  Antimicrobials:  Anti-infectives    Start     Dose/Rate Route Frequency Ordered Stop   12/17/16 0600  vancomycin (VANCOCIN) IVPB 750 mg/150 ml premix     750 mg 150 mL/hr over 60 Minutes Intravenous Every 12 hours 12/17/16 0543     12/10/16 1700  vancomycin (VANCOCIN) 500 mg in sodium chloride 0.9 % 100 mL IVPB  Status:  Discontinued     500 mg 100 mL/hr over 60  Minutes Intravenous Every  12 hours 12/10/16 1128 12/17/16 0543   11/29/16 1000  vancomycin (VANCOCIN) IVPB 750 mg/150 ml premix  Status:  Discontinued     750 mg 150 mL/hr over 60 Minutes Intravenous Every 12 hours 11/29/16 0934 12/10/16 1128   11/26/16 2000  vancomycin (VANCOCIN) IVPB 1000 mg/200 mL premix  Status:  Discontinued     1,000 mg 200 mL/hr over 60 Minutes Intravenous Every 12 hours 11/26/16 1912 11/29/16 0853   11/24/16 0000  vancomycin (VANCOCIN) IVPB 1000 mg/200 mL premix  Status:  Discontinued     1,000 mg 200 mL/hr over 60 Minutes Intravenous Every 8 hours 11/23/16 1238 11/26/16 0721   11/23/16 1600  vancomycin (VANCOCIN) 1,500 mg in sodium chloride 0.9 % 500 mL IVPB     1,500 mg 250 mL/hr over 120 Minutes Intravenous  Once 11/23/16 1238 11/23/16 1733   11/17/16 1600  DAPTOmycin (CUBICIN) 500 mg in sodium chloride 0.9 % IVPB  Status:  Discontinued     500 mg 220 mL/hr over 30 Minutes Intravenous Every 24 hours 11/17/16 1432 11/23/16 1238   11/17/16 1500  ceftaroline (TEFLARO) 600 mg in sodium chloride 0.9 % 250 mL IVPB  Status:  Discontinued     600 mg 250 mL/hr over 60 Minutes Intravenous Every 12 hours 11/17/16 1435 11/23/16 1238   11/15/16 1800  ceftaroline (TEFLARO) 600 mg in sodium chloride 0.9 % 250 mL IVPB  Status:  Discontinued     600 mg 250 mL/hr over 60 Minutes Intravenous Every 12 hours 11/15/16 1635 11/17/16 1431   11/13/16 1215  vancomycin (VANCOCIN) IVPB 1000 mg/200 mL premix  Status:  Discontinued     1,000 mg 200 mL/hr over 60 Minutes Intravenous Every 8 hours 11/13/16 1203 11/16/16 0850   11/11/16 1100  vancomycin (VANCOCIN) IVPB 750 mg/150 ml premix  Status:  Discontinued     750 mg 150 mL/hr over 60 Minutes Intravenous Every 8 hours 11/11/16 0956 11/13/16 1203   11/11/16 0400  vancomycin (VANCOCIN) 500 mg in sodium chloride 0.9 % 100 mL IVPB  Status:  Discontinued     500 mg 100 mL/hr over 60 Minutes Intravenous Every 12 hours 11/10/16 1951 11/11/16 0956   11/10/16 2200   ceFEPIme (MAXIPIME) 1 g in dextrose 5 % 50 mL IVPB  Status:  Discontinued     1 g 100 mL/hr over 30 Minutes Intravenous Every 12 hours 11/10/16 1951 11/11/16 0926   11/10/16 2000  ceFEPIme (MAXIPIME) 2 g in dextrose 5 % 50 mL IVPB  Status:  Discontinued     2 g 100 mL/hr over 30 Minutes Intravenous  Once 11/10/16 1952 11/10/16 1954   11/10/16 2000  vancomycin (VANCOCIN) IVPB 1000 mg/200 mL premix  Status:  Discontinued     1,000 mg 200 mL/hr over 60 Minutes Intravenous  Once 11/10/16 1952 11/10/16 1954   11/10/16 1430  piperacillin-tazobactam (ZOSYN) IVPB 3.375 g     3.375 g 100 mL/hr over 30 Minutes Intravenous  Once 11/10/16 1426 11/10/16 1503   11/10/16 1430  vancomycin (VANCOCIN) IVPB 1000 mg/200 mL premix     1,000 mg 200 mL/hr over 60 Minutes Intravenous  Once 11/10/16 1426 11/10/16 1653       Objective: Vitals:   12/25/16 1357 12/25/16 2219 12/26/16 0528 12/26/16 1439  BP: (!) 106/52 113/73 (!) 92/58 116/68  Pulse: 91 77 76 (!) 102  Resp: 16 17 17    Temp: 98.4 F (36.9 C) 98.5 F (36.9 C) 98.2 F (36.8  C) 98.4 F (36.9 C)  TempSrc:   Oral   SpO2: 100% 100% 95% 99%  Weight:   58.1 kg (128 lb 1.6 oz)   Height:        Intake/Output Summary (Last 24 hours) at 12/26/16 1800 Last data filed at 12/25/16 1852  Gross per 24 hour  Intake               10 ml  Output                0 ml  Net               10 ml   Filed Weights   12/24/16 0240 12/25/16 0512 12/26/16 0528  Weight: 60.3 kg (133 lb) 58.3 kg (128 lb 8 oz) 58.1 kg (128 lb 1.6 oz)    Examination: General exam: Appears comfortable  HEENT: PERRLA, oral mucosa moist, no sclera icterus or thrush Respiratory system: Clear to auscultation. Respiratory effort normal. Cardiovascular system: S1 & S2 heard, RRR.  No murmurs  Gastrointestinal system: Abdomen soft, non-tender, nondistended. Normal bowel sound. No organomegaly Central nervous system: Alert and oriented. No focal neurological deficits. Extremities: No  cyanosis, clubbing or edema Skin: No rashes or ulcers Psychiatry:  Mood & affect appropriate.     Data Reviewed: I have personally reviewed following labs and imaging studies  CBC:  Recent Labs Lab 12/24/16 0503  WBC 5.6  HGB 10.4*  HCT 31.1*  MCV 87.4  PLT 188   Basic Metabolic Panel:  Recent Labs Lab 12/24/16 0503  NA 134*  K 3.9  CL 103  CO2 26  GLUCOSE 102*  BUN 12  CREATININE 0.77  CALCIUM 9.3   GFR: Estimated Creatinine Clearance: 88.2 mL/min (by C-G formula based on SCr of 0.77 mg/dL). Liver Function Tests: No results for input(s): AST, ALT, ALKPHOS, BILITOT, PROT, ALBUMIN in the last 168 hours. No results for input(s): LIPASE, AMYLASE in the last 168 hours. No results for input(s): AMMONIA in the last 168 hours. Coagulation Profile: No results for input(s): INR, PROTIME in the last 168 hours. Cardiac Enzymes: No results for input(s): CKTOTAL, CKMB, CKMBINDEX, TROPONINI in the last 168 hours. BNP (last 3 results) No results for input(s): PROBNP in the last 8760 hours. HbA1C: No results for input(s): HGBA1C in the last 72 hours. CBG: No results for input(s): GLUCAP in the last 168 hours. Lipid Profile: No results for input(s): CHOL, HDL, LDLCALC, TRIG, CHOLHDL, LDLDIRECT in the last 72 hours. Thyroid Function Tests: No results for input(s): TSH, T4TOTAL, FREET4, T3FREE, THYROIDAB in the last 72 hours. Anemia Panel: No results for input(s): VITAMINB12, FOLATE, FERRITIN, TIBC, IRON, RETICCTPCT in the last 72 hours. Urine analysis:    Component Value Date/Time   COLORURINE YELLOW 11/10/2016 2132   APPEARANCEUR CLEAR 11/10/2016 2132   LABSPEC 1.006 11/10/2016 2132   PHURINE 5.0 11/10/2016 2132   GLUCOSEU NEGATIVE 11/10/2016 2132   HGBUR MODERATE (A) 11/10/2016 2132   BILIRUBINUR NEGATIVE 11/10/2016 2132   KETONESUR NEGATIVE 11/10/2016 2132   PROTEINUR NEGATIVE 11/10/2016 2132   UROBILINOGEN 0.2 08/05/2010 1821   NITRITE NEGATIVE 11/10/2016 2132    LEUKOCYTESUR SMALL (A) 11/10/2016 2132   Sepsis Labs: @LABRCNTIP (procalcitonin:4,lacticidven:4) ) Recent Results (from the past 240 hour(s))  MRSA PCR Screening     Status: None   Collection Time: 12/17/16 10:15 AM  Result Value Ref Range Status   MRSA by PCR NEGATIVE NEGATIVE Final    Comment:  The GeneXpert MRSA Assay (FDA approved for NASAL specimens only), is one component of a comprehensive MRSA colonization surveillance program. It is not intended to diagnose MRSA infection nor to guide or monitor treatment for MRSA infections.          Radiology Studies: No results found.    Scheduled Meds: . buprenorphine-naloxone  2 tablet Sublingual Daily  . busPIRone  10 mg Oral TID  . furosemide  20 mg Oral Once per day on Tue Thu Sat  . gabapentin  400 mg Oral QID  . nicotine  14 mg Transdermal Daily  . potassium chloride  40 mEq Oral Daily  . saccharomyces boulardii  250 mg Oral BID   Continuous Infusions: . sodium chloride 250 mL (12/20/16 1806)  . vancomycin 750 mg (12/26/16 1743)     LOS: 46 days    Time spent in minutes: 35    Calvert Cantor, MD [facility] Pager: [email or web address] Password [email or web address] 12/26/2016, 6:00 PM

## 2016-12-27 LAB — CBC
HCT: 30.4 % — ABNORMAL LOW (ref 36.0–46.0)
Hemoglobin: 10.1 g/dL — ABNORMAL LOW (ref 12.0–15.0)
MCH: 28.4 pg (ref 26.0–34.0)
MCHC: 33.2 g/dL (ref 30.0–36.0)
MCV: 85.4 fL (ref 78.0–100.0)
Platelets: 177 10*3/uL (ref 150–400)
RBC: 3.56 MIL/uL — ABNORMAL LOW (ref 3.87–5.11)
RDW: 14.5 % (ref 11.5–15.5)
WBC: 5.5 10*3/uL (ref 4.0–10.5)

## 2016-12-27 LAB — VANCOMYCIN, TROUGH: VANCOMYCIN TR: 18 ug/mL (ref 15–20)

## 2016-12-27 LAB — RETICULOCYTES
RBC.: 3.63 MIL/uL — AB (ref 3.87–5.11)
Retic Count, Absolute: 25.4 10*3/uL (ref 19.0–186.0)
Retic Ct Pct: 0.7 % (ref 0.4–3.1)

## 2016-12-27 LAB — BASIC METABOLIC PANEL
Anion gap: 5 (ref 5–15)
BUN: 13 mg/dL (ref 6–20)
CALCIUM: 9.2 mg/dL (ref 8.9–10.3)
CO2: 27 mmol/L (ref 22–32)
Chloride: 104 mmol/L (ref 101–111)
Creatinine, Ser: 0.85 mg/dL (ref 0.44–1.00)
GFR calc Af Amer: >60 mL/min (ref >60)
GLUCOSE: 96 mg/dL (ref 65–99)
Potassium: 4 mmol/L (ref 3.5–5.1)
Sodium: 136 mmol/L (ref 135–145)

## 2016-12-27 LAB — FERRITIN: Ferritin: 265 ng/mL (ref 11–307)

## 2016-12-27 LAB — IRON AND TIBC
Iron: 41 ug/dL (ref 28–170)
SATURATION RATIOS: 12 % (ref 10.4–31.8)
TIBC: 342 ug/dL (ref 250–450)
UIBC: 301 ug/dL

## 2016-12-27 NOTE — Progress Notes (Addendum)
PROGRESS NOTE    Savannah Palmer   EAV:[id number]  DOB: 07/29/1990  DOA: 11/10/2016 PCP: Lindaann PascalLong, Scott, PA-C   Brief Narrative:  26 year old Caucasian female with a past medical history of anxiety, depression, Heroin abuse, presented with weakness, cough and chest pain. Patient was initially in septic shock and required pressors. Blood cultures were positive for MRSA. Patient has a history of tricuspid valve endocarditis and was found to have septic emboli throughout her lungs. She  developed spontaneous right-sided pneumothorax after TEE and requiring chest tube placement. Patient was seen by cardiothoracic surgery and was not thought to be a candidate for surgical intervention for her endocarditis. She will be in the hospital till she completes her antibiotic treatment, which will be on July 15.  Subjective: She has no complaints of pain, nausea, vomiting, diarrhea, cough, congestion or dysuria. No other complaints.  I spoken with her mother again today. She has the same questions that have been answered in the past which I have answered once again today.    Assessment & Plan:   MRSA bacteremia/septic shock/tricuspid valve endocarditis/septic emboli to lungs/MRSA empyema: IMPROVED. Patient remains stable. She initially required pressors due to septic shock. Echocardiogram showed tricuspid valve vegetations with moderate TR. She also underwent TEE which also showed findings suggestive of endocarditis. HIV, hepatitis C and RPR were negative. Patient was seen by infectious disease and cardiothoracic surgery. Patient was initially placed on vancomycin. Due to persistent bacteremia she was started on daptomycin and ceftazidime. These have been discontinued. Only on vancomycin for now. PICC line was placed on 6/9.  Vancomycin to continue through July 15 after which she can be discharged home.    Right spontaneous pneumothorax: - occurring after TEE - Seen by pulmonology as well as  cardiothoracic. Chest tube was placed and then removed.    Anxiety - Patient has significant anxiety and per family is primary reason for patient's relapse with drug abuse. - cont Buspar TID  Acute kidney injury - Baseline creatinine was 0.7. Increased to 1.37. Thought to be secondary to dehydration and NSAIDs -  Now improved.  Right upper quadrant abdominal pain.  CT scan of the abdomen pelvis did not show any acute abnormalities. Pain thought to be secondary to musculoskeletal in origin. Now resolved.  Ascites/anasarca due to right heart failure. - This is thought to be a result of severe TR.  - Patient was placed on Lasix 3 x week and is euvolemic  Splenomegaly. - Detected on CT scan -Will need continued monitoring as outpatient.  Normocytic anemia - Iron deficiency Hemoglobin stable at 10-11 range - Last transfused on 6/5. - check Iron panel in AM  Hypokalemia/hypophosphatemia/hypomagnesemia. Resolved.  Thrombocytopenia. Resolved  Heroin abuse - Continue Suboxone - Will transition to a drug rehabilitation program once stable. Social worker has provided resources   Tobacco abuse Continue nicotine patch  DVT Prophylaxis: Subcutaneous heparin    Code Status: Full code  Family Communication: Discussed with the patient and mother Disposition Plan: Sd/c home on 7/15 Consultants:  Infectious disease. Cardiothoracic surgery. Pulmonology Procedures:  TEE. Chest tube placement. PICC line placement.  Antimicrobials:  Anti-infectives    Start     Dose/Rate Route Frequency Ordered Stop   12/17/16 0600  vancomycin (VANCOCIN) IVPB 750 mg/150 ml premix     750 mg 150 mL/hr over 60 Minutes Intravenous Every 12 hours 12/17/16 0543     12/10/16 1700  vancomycin (VANCOCIN) 500 mg in sodium chloride 0.9 % 100 mL IVPB  Status:  Discontinued     500 mg 100 mL/hr over 60 Minutes Intravenous Every 12 hours 12/10/16 1128 12/17/16 0543   11/29/16 1000  vancomycin  (VANCOCIN) IVPB 750 mg/150 ml premix  Status:  Discontinued     750 mg 150 mL/hr over 60 Minutes Intravenous Every 12 hours 11/29/16 0934 12/10/16 1128   11/26/16 2000  vancomycin (VANCOCIN) IVPB 1000 mg/200 mL premix  Status:  Discontinued     1,000 mg 200 mL/hr over 60 Minutes Intravenous Every 12 hours 11/26/16 1912 11/29/16 0853   11/24/16 0000  vancomycin (VANCOCIN) IVPB 1000 mg/200 mL premix  Status:  Discontinued     1,000 mg 200 mL/hr over 60 Minutes Intravenous Every 8 hours 11/23/16 1238 11/26/16 0721   11/23/16 1600  vancomycin (VANCOCIN) 1,500 mg in sodium chloride 0.9 % 500 mL IVPB     1,500 mg 250 mL/hr over 120 Minutes Intravenous  Once 11/23/16 1238 11/23/16 1733   11/17/16 1600  DAPTOmycin (CUBICIN) 500 mg in sodium chloride 0.9 % IVPB  Status:  Discontinued     500 mg 220 mL/hr over 30 Minutes Intravenous Every 24 hours 11/17/16 1432 11/23/16 1238   11/17/16 1500  ceftaroline (TEFLARO) 600 mg in sodium chloride 0.9 % 250 mL IVPB  Status:  Discontinued     600 mg 250 mL/hr over 60 Minutes Intravenous Every 12 hours 11/17/16 1435 11/23/16 1238   11/15/16 1800  ceftaroline (TEFLARO) 600 mg in sodium chloride 0.9 % 250 mL IVPB  Status:  Discontinued     600 mg 250 mL/hr over 60 Minutes Intravenous Every 12 hours 11/15/16 1635 11/17/16 1431   11/13/16 1215  vancomycin (VANCOCIN) IVPB 1000 mg/200 mL premix  Status:  Discontinued     1,000 mg 200 mL/hr over 60 Minutes Intravenous Every 8 hours 11/13/16 1203 11/16/16 0850   11/11/16 1100  vancomycin (VANCOCIN) IVPB 750 mg/150 ml premix  Status:  Discontinued     750 mg 150 mL/hr over 60 Minutes Intravenous Every 8 hours 11/11/16 0956 11/13/16 1203   11/11/16 0400  vancomycin (VANCOCIN) 500 mg in sodium chloride 0.9 % 100 mL IVPB  Status:  Discontinued     500 mg 100 mL/hr over 60 Minutes Intravenous Every 12 hours 11/10/16 1951 11/11/16 0956   11/10/16 2200  ceFEPIme (MAXIPIME) 1 g in dextrose 5 % 50 mL IVPB  Status:   Discontinued     1 g 100 mL/hr over 30 Minutes Intravenous Every 12 hours 11/10/16 1951 11/11/16 0926   11/10/16 2000  ceFEPIme (MAXIPIME) 2 g in dextrose 5 % 50 mL IVPB  Status:  Discontinued     2 g 100 mL/hr over 30 Minutes Intravenous  Once 11/10/16 1952 11/10/16 1954   11/10/16 2000  vancomycin (VANCOCIN) IVPB 1000 mg/200 mL premix  Status:  Discontinued     1,000 mg 200 mL/hr over 60 Minutes Intravenous  Once 11/10/16 1952 11/10/16 1954   11/10/16 1430  piperacillin-tazobactam (ZOSYN) IVPB 3.375 g     3.375 g 100 mL/hr over 30 Minutes Intravenous  Once 11/10/16 1426 11/10/16 1503   11/10/16 1430  vancomycin (VANCOCIN) IVPB 1000 mg/200 mL premix     1,000 mg 200 mL/hr over 60 Minutes Intravenous  Once 11/10/16 1426 11/10/16 1653       Objective: Vitals:   12/26/16 1439 12/26/16 2122 12/27/16 0516 12/27/16 1417  BP: 116/68 112/71 95/63 100/64  Pulse: (!) 102 91 82 85  Resp:  18 18   Temp: 98.4 F (  36.9 C) 98.9 F (37.2 C) 98.1 F (36.7 C) 98.5 F (36.9 C)  TempSrc:      SpO2: 99% 100% 99% 100%  Weight:   57.9 kg (127 lb 11.2 oz)   Height:        Intake/Output Summary (Last 24 hours) at 12/27/16 1600 Last data filed at 12/27/16 1000  Gross per 24 hour  Intake              640 ml  Output                0 ml  Net              640 ml   Filed Weights   12/25/16 0512 12/26/16 0528 12/27/16 0516  Weight: 58.3 kg (128 lb 8 oz) 58.1 kg (128 lb 1.6 oz) 57.9 kg (127 lb 11.2 oz)    Examination: General exam: Appears comfortable  HEENT: PERRLA, oral mucosa moist, no sclera icterus or thrush Respiratory system: Clear to auscultation. Respiratory effort normal. Cardiovascular system: S1 & S2 heard, RRR.  No murmurs  Gastrointestinal system: Abdomen soft, non-tender, nondistended. Normal bowel sound. No organomegaly Central nervous system: Alert and oriented. No focal neurological deficits. Extremities: No cyanosis, clubbing or edema Skin: No rashes or ulcers Psychiatry:   Mood & affect appropriate.     Data Reviewed: I have personally reviewed following labs and imaging studies  CBC:  Recent Labs Lab 12/24/16 0503 12/27/16 0432  WBC 5.6 5.5  HGB 10.4* 10.1*  HCT 31.1* 30.4*  MCV 87.4 85.4  PLT 188 177   Basic Metabolic Panel:  Recent Labs Lab 12/24/16 0503 12/27/16 0432  NA 134* 136  K 3.9 4.0  CL 103 104  CO2 26 27  GLUCOSE 102* 96  BUN 12 13  CREATININE 0.77 0.85  CALCIUM 9.3 9.2   GFR: Estimated Creatinine Clearance: 83 mL/min (by C-G formula based on SCr of 0.85 mg/dL). Liver Function Tests: No results for input(s): AST, ALT, ALKPHOS, BILITOT, PROT, ALBUMIN in the last 168 hours. No results for input(s): LIPASE, AMYLASE in the last 168 hours. No results for input(s): AMMONIA in the last 168 hours. Coagulation Profile: No results for input(s): INR, PROTIME in the last 168 hours. Cardiac Enzymes: No results for input(s): CKTOTAL, CKMB, CKMBINDEX, TROPONINI in the last 168 hours. BNP (last 3 results) No results for input(s): PROBNP in the last 8760 hours. HbA1C: No results for input(s): HGBA1C in the last 72 hours. CBG: No results for input(s): GLUCAP in the last 168 hours. Lipid Profile: No results for input(s): CHOL, HDL, LDLCALC, TRIG, CHOLHDL, LDLDIRECT in the last 72 hours. Thyroid Function Tests: No results for input(s): TSH, T4TOTAL, FREET4, T3FREE, THYROIDAB in the last 72 hours. Anemia Panel: No results for input(s): VITAMINB12, FOLATE, FERRITIN, TIBC, IRON, RETICCTPCT in the last 72 hours. Urine analysis:    Component Value Date/Time   COLORURINE YELLOW 11/10/2016 2132   APPEARANCEUR CLEAR 11/10/2016 2132   LABSPEC 1.006 11/10/2016 2132   PHURINE 5.0 11/10/2016 2132   GLUCOSEU NEGATIVE 11/10/2016 2132   HGBUR MODERATE (A) 11/10/2016 2132   BILIRUBINUR NEGATIVE 11/10/2016 2132   KETONESUR NEGATIVE 11/10/2016 2132   PROTEINUR NEGATIVE 11/10/2016 2132   UROBILINOGEN 0.2 08/05/2010 1821   NITRITE NEGATIVE  11/10/2016 2132   LEUKOCYTESUR SMALL (A) 11/10/2016 2132   Sepsis Labs: @LABRCNTIP (procalcitonin:4,lacticidven:4) ) No results found for this or any previous visit (from the past 240 hour(s)).       Radiology Studies: No  results found.    Scheduled Meds: . buprenorphine-naloxone  2 tablet Sublingual Daily  . busPIRone  10 mg Oral TID  . furosemide  20 mg Oral Once per day on Tue Thu Sat  . gabapentin  400 mg Oral QID  . nicotine  14 mg Transdermal Daily  . potassium chloride  40 mEq Oral Daily  . saccharomyces boulardii  250 mg Oral BID   Continuous Infusions: . sodium chloride 250 mL (12/20/16 1806)  . vancomycin Stopped (12/27/16 0743)     LOS: 47 days    Time spent in minutes: 35    Calvert Cantor, MD [facility] Pager: [email or web address] Password [email or web address] 12/27/2016, 4:00 PM

## 2016-12-27 NOTE — Progress Notes (Signed)
Pharmacy Antibiotic Note Savannah Palmer is a 26 y.o. 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 Pharmacy following for vancomycin dosing. 6 week course of Vanc planned thru 12/30/16.  -vancomycin trough= 18 (~ 6pm). Dose charted in EPIC at ~ 5pm but has not been given (last dose was ~ 7am).   Plan: -Continue Vancomycin 750 mg IV q12hrs -BMET and CBC on Mondays and Thursdays -6 weeks of treatment, through 12/30/2016   Height: 5\' 3"  (160 cm) Weight: 127 lb 11.2 oz (57.9 kg) IBW/kg (Calculated) : 52.4  Temp (24hrs), Avg:98.5 F (36.9 C), Min:98.1 F (36.7 C), Max:98.9 F (37.2 C)   Recent Labs Lab 12/24/16 0503 12/27/16 0432 12/27/16 1745  WBC 5.6 5.5  --   CREATININE 0.77 0.85  --   VANCOTROUGH 15  --  18    Estimated Creatinine Clearance: 83 mL/min (by C-G formula based on SCr of 0.85 mg/dL).    No Known Allergies  5/26 Zosyn x 1 5/26 Vanc >> 6/1;   6/8 >> 5/26 Cefepime >> 5/27 5/31 Teflaro >> 6/8 6/2 Daptomycin >> 6/8   5/27: increase vanc to 750 q8 with improved renal function 5/29  VT: 13 (subtherapeutic) on 750mg  q8h prior to 7th dose, inc to 1g q8h 5/31  VT: 19 (therapeutic) - continue 1g q8h 6/11  VT: 38 (accumulated on q8h dose)  VANC HOLD 6/11 VR 19 (t1/2 ~11.5 > reduced to 1000mg  q12) 6/14 VT 23 on 1g q12h > reduced to 750mg  q12h 6/16 VT 17 on 750mg  q12h > no changes 6/22: VT 17 on 750 mg q12h 6/25: VT 21(slightly supratherapeutic) on 750 mg q12h > reduced to 500 mg q12h 6/28: VT 15 mcg/ml on 500 mg q12h 7/2: VT 12 mcg/ml on 750 mg q 12h  7/6 VT = 16 on 750mg  IV q12h > no change 7/9 VT = 15 on 750mg  IV q12h > no change 7/12 VT= 18 on 750mg  IV q12h > no change  5/26 UCx: NGF 5/26 BCx: 4/4 MRSA 5/26 MRSA PCR: positive  5/27 BCx: 2/2 MRSA  5/28 BCx: 2/2 MRSA 5/28 HIV antibody: non reactive 5/28 HCV RNA: ND 5/29 BCx: MRSA, BCID = MRSA 5/30 @ 0033 BCx: 1/2 MRSA 5/30 @ 1610 BCx: ngF 6/1  pleural fluid Cx: MRSA 6/4 BCx: ngF  Thank you for allowing pharmacy to be a part of this patient's care.  Harland GermanAndrew Ziyan Hillmer, Pharm D 12/27/2016 6:35 PM

## 2016-12-28 NOTE — Progress Notes (Signed)
Patient was witnessed walking back to the unit with her boyfriend around [id number]. Unsure how long she was off unit. MD notified.   Sherlon HandingElisa Joshual Terrio, LPN 1/61/097/13/18

## 2016-12-28 NOTE — Progress Notes (Addendum)
PROGRESS NOTE    Savannah Palmer   ZOX:[id number]  DOB: 03-14-1991  DOA: 11/10/2016 PCP: Lindaann Pascal, PA-C   Brief Narrative:  26 year old Caucasian female with a past medical history of anxiety, depression, Heroin abuse, presented with weakness, cough and chest pain. Patient was initially in septic shock and required pressors. Blood cultures were positive for MRSA. Patient has a history of tricuspid valve endocarditis and was found to have septic emboli throughout her lungs. She  developed spontaneous right-sided pneumothorax after TEE and requiring chest tube placement. Patient was seen by cardiothoracic surgery and was not thought to be a candidate for surgical intervention for her endocarditis. She will be in the hospital untill she completes her antibiotic treatment, which will be on July 15.  Subjective: Evaluated this AM. She has no complaints of pain, nausea, vomiting, diarrhea, cough, congestion or dysuria. No other complaints.    Assessment & Plan:   MRSA bacteremia/septic shock/tricuspid valve endocarditis/septic emboli to lungs/MRSA empyema: IMPROVED. Patient remains stable. She initially required pressors due to septic shock. Echocardiogram showed tricuspid valve vegetations with moderate TR. She also underwent TEE which also showed findings suggestive of endocarditis. HIV, hepatitis C and RPR were negative. Patient was seen by infectious disease and cardiothoracic surgery. Patient was initially placed on vancomycin. Due to persistent bacteremia she was started on daptomycin and ceftazidime. These have been discontinued. Only on vancomycin for now. PICC line was placed on 6/9.  Vancomycin to continue through July 15 after which she can be discharged home.    Right spontaneous pneumothorax: - occurring after TEE - Seen by pulmonology as well as cardiothoracic. Chest tube was placed and then removed.    Anxiety - Patient has significant anxiety and per family is primary  reason for patient's relapse with drug abuse. - cont Buspar TID  Acute kidney injury - Baseline creatinine was 0.7. Increased to 1.37. Thought to be secondary to dehydration and NSAIDs -  Now improved.  Right upper quadrant abdominal pain.  CT scan of the abdomen pelvis did not show any acute abnormalities. Pain thought to be secondary to musculoskeletal in origin. Now resolved.  Ascites/anasarca due to right heart failure. - This is thought to be a result of severe TR.  - Patient was placed on Lasix 3 x week and is euvolemic  Splenomegaly. - Detected on CT scan -Will need continued monitoring as outpatient.  Normocytic anemia  - Hemoglobin stable at 10-11 range - Last transfused on 6/5. -   Iron panel shows sufficient iron level  Hypokalemia/hypophosphatemia/hypomagnesemia. Resolved.  Thrombocytopenia. Resolved  Heroin abuse - Continue Suboxone - Will transition to a drug rehabilitation program once stable. Social worker has provided resources   Tobacco abuse Continue nicotine patch  DVT Prophylaxis: Subcutaneous heparin    Code Status: Full code  Family Communication: Discussed with the patient and mother Disposition Plan: Sd/c home on 7/15 Consultants:  Infectious disease. Cardiothoracic surgery. Pulmonology Procedures:  TEE. Chest tube placement. PICC line placement.  Antimicrobials:  Anti-infectives    Start     Dose/Rate Route Frequency Ordered Stop   12/17/16 0600  vancomycin (VANCOCIN) IVPB 750 mg/150 ml premix     750 mg 150 mL/hr over 60 Minutes Intravenous Every 12 hours 12/17/16 0543     12/10/16 1700  vancomycin (VANCOCIN) 500 mg in sodium chloride 0.9 % 100 mL IVPB  Status:  Discontinued     500 mg 100 mL/hr over 60 Minutes Intravenous Every 12 hours 12/10/16 1128 12/17/16 0543  11/29/16 1000  vancomycin (VANCOCIN) IVPB 750 mg/150 ml premix  Status:  Discontinued     750 mg 150 mL/hr over 60 Minutes Intravenous Every 12 hours 11/29/16  0934 12/10/16 1128   11/26/16 2000  vancomycin (VANCOCIN) IVPB 1000 mg/200 mL premix  Status:  Discontinued     1,000 mg 200 mL/hr over 60 Minutes Intravenous Every 12 hours 11/26/16 1912 11/29/16 0853   11/24/16 0000  vancomycin (VANCOCIN) IVPB 1000 mg/200 mL premix  Status:  Discontinued     1,000 mg 200 mL/hr over 60 Minutes Intravenous Every 8 hours 11/23/16 1238 11/26/16 0721   11/23/16 1600  vancomycin (VANCOCIN) 1,500 mg in sodium chloride 0.9 % 500 mL IVPB     1,500 mg 250 mL/hr over 120 Minutes Intravenous  Once 11/23/16 1238 11/23/16 1733   11/17/16 1600  DAPTOmycin (CUBICIN) 500 mg in sodium chloride 0.9 % IVPB  Status:  Discontinued     500 mg 220 mL/hr over 30 Minutes Intravenous Every 24 hours 11/17/16 1432 11/23/16 1238   11/17/16 1500  ceftaroline (TEFLARO) 600 mg in sodium chloride 0.9 % 250 mL IVPB  Status:  Discontinued     600 mg 250 mL/hr over 60 Minutes Intravenous Every 12 hours 11/17/16 1435 11/23/16 1238   11/15/16 1800  ceftaroline (TEFLARO) 600 mg in sodium chloride 0.9 % 250 mL IVPB  Status:  Discontinued     600 mg 250 mL/hr over 60 Minutes Intravenous Every 12 hours 11/15/16 1635 11/17/16 1431   11/13/16 1215  vancomycin (VANCOCIN) IVPB 1000 mg/200 mL premix  Status:  Discontinued     1,000 mg 200 mL/hr over 60 Minutes Intravenous Every 8 hours 11/13/16 1203 11/16/16 0850   11/11/16 1100  vancomycin (VANCOCIN) IVPB 750 mg/150 ml premix  Status:  Discontinued     750 mg 150 mL/hr over 60 Minutes Intravenous Every 8 hours 11/11/16 0956 11/13/16 1203   11/11/16 0400  vancomycin (VANCOCIN) 500 mg in sodium chloride 0.9 % 100 mL IVPB  Status:  Discontinued     500 mg 100 mL/hr over 60 Minutes Intravenous Every 12 hours 11/10/16 1951 11/11/16 0956   11/10/16 2200  ceFEPIme (MAXIPIME) 1 g in dextrose 5 % 50 mL IVPB  Status:  Discontinued     1 g 100 mL/hr over 30 Minutes Intravenous Every 12 hours 11/10/16 1951 11/11/16 0926   11/10/16 2000  ceFEPIme (MAXIPIME) 2  g in dextrose 5 % 50 mL IVPB  Status:  Discontinued     2 g 100 mL/hr over 30 Minutes Intravenous  Once 11/10/16 1952 11/10/16 1954   11/10/16 2000  vancomycin (VANCOCIN) IVPB 1000 mg/200 mL premix  Status:  Discontinued     1,000 mg 200 mL/hr over 60 Minutes Intravenous  Once 11/10/16 1952 11/10/16 1954   11/10/16 1430  piperacillin-tazobactam (ZOSYN) IVPB 3.375 g     3.375 g 100 mL/hr over 30 Minutes Intravenous  Once 11/10/16 1426 11/10/16 1503   11/10/16 1430  vancomycin (VANCOCIN) IVPB 1000 mg/200 mL premix     1,000 mg 200 mL/hr over 60 Minutes Intravenous  Once 11/10/16 1426 11/10/16 1653       Objective: Vitals:   12/27/16 1417 12/27/16 2207 12/28/16 0545 12/28/16 1504  BP: 100/64 111/69 (!) 96/55 105/68  Pulse: 85 (!) 105 89 90  Resp:  18 18 18   Temp: 98.5 F (36.9 C) 99.4 F (37.4 C) 98.2 F (36.8 C)   TempSrc:      SpO2: 100% 99%  92% 100%  Weight:      Height:        Intake/Output Summary (Last 24 hours) at 12/28/16 1546 Last data filed at 12/28/16 0834  Gross per 24 hour  Intake              310 ml  Output                0 ml  Net              310 ml   Filed Weights   12/25/16 0512 12/26/16 0528 12/27/16 0516  Weight: 58.3 kg (128 lb 8 oz) 58.1 kg (128 lb 1.6 oz) 57.9 kg (127 lb 11.2 oz)    Examination: General exam: Appears comfortable - sitting up in bed in no distress HEENT: PERRLA, oral mucosa moist, no sclera icterus or thrush Respiratory system: Clear to auscultation. Respiratory effort normal. Cardiovascular system: S1 & S2 heard, RRR.  No murmurs  Gastrointestinal system: Abdomen soft, non-tender, nondistended. Normal bowel sound. No organomegaly Central nervous system: Alert and oriented. No focal neurological deficits. Extremities: No cyanosis, clubbing or edema Skin: No rashes or ulcers Psychiatry:  Mood & affect appropriate.     Data Reviewed: I have personally reviewed following labs and imaging studies  CBC:  Recent Labs Lab  12/24/16 0503 12/27/16 0432  WBC 5.6 5.5  HGB 10.4* 10.1*  HCT 31.1* 30.4*  MCV 87.4 85.4  PLT 188 177   Basic Metabolic Panel:  Recent Labs Lab 12/24/16 0503 12/27/16 0432  NA 134* 136  K 3.9 4.0  CL 103 104  CO2 26 27  GLUCOSE 102* 96  BUN 12 13  CREATININE 0.77 0.85  CALCIUM 9.3 9.2   GFR: Estimated Creatinine Clearance: 83 mL/min (by C-G formula based on SCr of 0.85 mg/dL). Liver Function Tests: No results for input(s): AST, ALT, ALKPHOS, BILITOT, PROT, ALBUMIN in the last 168 hours. No results for input(s): LIPASE, AMYLASE in the last 168 hours. No results for input(s): AMMONIA in the last 168 hours. Coagulation Profile: No results for input(s): INR, PROTIME in the last 168 hours. Cardiac Enzymes: No results for input(s): CKTOTAL, CKMB, CKMBINDEX, TROPONINI in the last 168 hours. BNP (last 3 results) No results for input(s): PROBNP in the last 8760 hours. HbA1C: No results for input(s): HGBA1C in the last 72 hours. CBG: No results for input(s): GLUCAP in the last 168 hours. Lipid Profile: No results for input(s): CHOL, HDL, LDLCALC, TRIG, CHOLHDL, LDLDIRECT in the last 72 hours. Thyroid Function Tests: No results for input(s): TSH, T4TOTAL, FREET4, T3FREE, THYROIDAB in the last 72 hours. Anemia Panel:  Recent Labs  12/27/16 1745  FERRITIN 265  TIBC 342  IRON 41  RETICCTPCT 0.7   Urine analysis:    Component Value Date/Time   COLORURINE YELLOW 11/10/2016 2132   APPEARANCEUR CLEAR 11/10/2016 2132   LABSPEC 1.006 11/10/2016 2132   PHURINE 5.0 11/10/2016 2132   GLUCOSEU NEGATIVE 11/10/2016 2132   HGBUR MODERATE (A) 11/10/2016 2132   BILIRUBINUR NEGATIVE 11/10/2016 2132   KETONESUR NEGATIVE 11/10/2016 2132   PROTEINUR NEGATIVE 11/10/2016 2132   UROBILINOGEN 0.2 08/05/2010 1821   NITRITE NEGATIVE 11/10/2016 2132   LEUKOCYTESUR SMALL (A) 11/10/2016 2132   Sepsis Labs: @LABRCNTIP (procalcitonin:4,lacticidven:4) ) No results found for this or any  previous visit (from the past 240 hour(s)).       Radiology Studies: No results found.    Scheduled Meds: . buprenorphine-naloxone  2 tablet Sublingual Daily  . busPIRone  10 mg Oral TID  . furosemide  20 mg Oral Once per day on Tue Thu Sat  . gabapentin  400 mg Oral QID  . nicotine  14 mg Transdermal Daily  . potassium chloride  40 mEq Oral Daily  . saccharomyces boulardii  250 mg Oral BID   Continuous Infusions: . sodium chloride 250 mL (12/20/16 1806)  . vancomycin Stopped (12/28/16 0749)     LOS: 48 days    Time spent in minutes: 35    Calvert CantorSaima Emarie Paul, MD [facility] Pager: [email or web address] Password [email or web address] 12/28/2016, 3:46 PM

## 2016-12-29 NOTE — Progress Notes (Signed)
PROGRESS NOTE    Savannah Palmer   ZOX:[id number]  DOB: 1990-11-13  DOA: 11/10/2016 PCP: Lindaann Pascal, PA-C   Brief Narrative:  26 year old Caucasian female with a past medical history of anxiety, depression, Heroin abuse, presented with weakness, cough and chest pain. Patient was initially in septic shock and required pressors. Blood cultures were positive for MRSA. Patient has a history of tricuspid valve endocarditis and was found to have septic emboli throughout her lungs. She  developed spontaneous right-sided pneumothorax after TEE and requiring chest tube placement. Patient was seen by cardiothoracic surgery and was not thought to be a candidate for surgical intervention for her endocarditis. She will be in the hospital untill she completes her antibiotic treatment, which will be on July 15.  Subjective: She has no complaints of nausea, vomiting, diarrhea, constipation, cough, congestion, dysuria or pain. No other complaints.    Assessment & Plan:   MRSA bacteremia/septic shock/tricuspid valve endocarditis/septic emboli to lungs/MRSA empyema: IMPROVED. Patient remains stable. She initially required pressors due to septic shock. Echocardiogram showed tricuspid valve vegetations with moderate TR. She also underwent TEE which also showed findings suggestive of endocarditis. HIV, hepatitis C and RPR were negative. Patient was seen by infectious disease and cardiothoracic surgery. Patient was initially placed on vancomycin. Due to persistent bacteremia she was started on daptomycin and ceftazidime. These have been discontinued. Only on vancomycin for now. PICC line was placed on 6/9.  Vancomycin to continue through July 15 after which she can be discharged home.    Right spontaneous pneumothorax: - occurring after TEE - Seen by pulmonology as well as cardiothoracic. Chest tube was placed and then removed.    Anxiety - Patient has significant anxiety and per family is primary reason  for patient's relapse with drug abuse. - cont Buspar TID  Acute kidney injury - Baseline creatinine was 0.7. Increased to 1.37. Thought to be secondary to dehydration and NSAIDs -  Now improved.  Right upper quadrant abdominal pain.  CT scan of the abdomen pelvis did not show any acute abnormalities. Pain thought to be secondary to musculoskeletal in origin. Now resolved.  Ascites/anasarca due to right heart failure. - This is thought to be a result of severe TR.  - Patient was placed on Lasix 3 x week and is euvolemic  Splenomegaly. - Detected on CT scan -Will need continued monitoring as outpatient.  Normocytic anemia  - Hemoglobin stable at 10-11 range - Last transfused on 6/5. -   Iron panel shows sufficient iron level  Hypokalemia/hypophosphatemia/hypomagnesemia. Resolved.  Thrombocytopenia. Resolved  Heroin abuse - Continue Suboxone - Will transition to a drug rehabilitation program once stable. Social worker has provided resources   Tobacco abuse Continue nicotine patch  DVT Prophylaxis: Subcutaneous heparin    Code Status: Full code  Family Communication: Discussed with the patient and mother Disposition Plan: Sd/c home on 7/15 Consultants:  Infectious disease. Cardiothoracic surgery. Pulmonology Procedures:  TEE. Chest tube placement. PICC line placement.  Antimicrobials:  Anti-infectives    Start     Dose/Rate Route Frequency Ordered Stop   12/17/16 0600  vancomycin (VANCOCIN) IVPB 750 mg/150 ml premix     750 mg 150 mL/hr over 60 Minutes Intravenous Every 12 hours 12/17/16 0543     12/10/16 1700  vancomycin (VANCOCIN) 500 mg in sodium chloride 0.9 % 100 mL IVPB  Status:  Discontinued     500 mg 100 mL/hr over 60 Minutes Intravenous Every 12 hours 12/10/16 1128 12/17/16 0543   11/29/16  1000  vancomycin (VANCOCIN) IVPB 750 mg/150 ml premix  Status:  Discontinued     750 mg 150 mL/hr over 60 Minutes Intravenous Every 12 hours 11/29/16 0934  12/10/16 1128   11/26/16 2000  vancomycin (VANCOCIN) IVPB 1000 mg/200 mL premix  Status:  Discontinued     1,000 mg 200 mL/hr over 60 Minutes Intravenous Every 12 hours 11/26/16 1912 11/29/16 0853   11/24/16 0000  vancomycin (VANCOCIN) IVPB 1000 mg/200 mL premix  Status:  Discontinued     1,000 mg 200 mL/hr over 60 Minutes Intravenous Every 8 hours 11/23/16 1238 11/26/16 0721   11/23/16 1600  vancomycin (VANCOCIN) 1,500 mg in sodium chloride 0.9 % 500 mL IVPB     1,500 mg 250 mL/hr over 120 Minutes Intravenous  Once 11/23/16 1238 11/23/16 1733   11/17/16 1600  DAPTOmycin (CUBICIN) 500 mg in sodium chloride 0.9 % IVPB  Status:  Discontinued     500 mg 220 mL/hr over 30 Minutes Intravenous Every 24 hours 11/17/16 1432 11/23/16 1238   11/17/16 1500  ceftaroline (TEFLARO) 600 mg in sodium chloride 0.9 % 250 mL IVPB  Status:  Discontinued     600 mg 250 mL/hr over 60 Minutes Intravenous Every 12 hours 11/17/16 1435 11/23/16 1238   11/15/16 1800  ceftaroline (TEFLARO) 600 mg in sodium chloride 0.9 % 250 mL IVPB  Status:  Discontinued     600 mg 250 mL/hr over 60 Minutes Intravenous Every 12 hours 11/15/16 1635 11/17/16 1431   11/13/16 1215  vancomycin (VANCOCIN) IVPB 1000 mg/200 mL premix  Status:  Discontinued     1,000 mg 200 mL/hr over 60 Minutes Intravenous Every 8 hours 11/13/16 1203 11/16/16 0850   11/11/16 1100  vancomycin (VANCOCIN) IVPB 750 mg/150 ml premix  Status:  Discontinued     750 mg 150 mL/hr over 60 Minutes Intravenous Every 8 hours 11/11/16 0956 11/13/16 1203   11/11/16 0400  vancomycin (VANCOCIN) 500 mg in sodium chloride 0.9 % 100 mL IVPB  Status:  Discontinued     500 mg 100 mL/hr over 60 Minutes Intravenous Every 12 hours 11/10/16 1951 11/11/16 0956   11/10/16 2200  ceFEPIme (MAXIPIME) 1 g in dextrose 5 % 50 mL IVPB  Status:  Discontinued     1 g 100 mL/hr over 30 Minutes Intravenous Every 12 hours 11/10/16 1951 11/11/16 0926   11/10/16 2000  ceFEPIme (MAXIPIME) 2 g in  dextrose 5 % 50 mL IVPB  Status:  Discontinued     2 g 100 mL/hr over 30 Minutes Intravenous  Once 11/10/16 1952 11/10/16 1954   11/10/16 2000  vancomycin (VANCOCIN) IVPB 1000 mg/200 mL premix  Status:  Discontinued     1,000 mg 200 mL/hr over 60 Minutes Intravenous  Once 11/10/16 1952 11/10/16 1954   11/10/16 1430  piperacillin-tazobactam (ZOSYN) IVPB 3.375 g     3.375 g 100 mL/hr over 30 Minutes Intravenous  Once 11/10/16 1426 11/10/16 1503   11/10/16 1430  vancomycin (VANCOCIN) IVPB 1000 mg/200 mL premix     1,000 mg 200 mL/hr over 60 Minutes Intravenous  Once 11/10/16 1426 11/10/16 1653       Objective: Vitals:   12/28/16 1504 12/28/16 2125 12/29/16 0522 12/29/16 1406  BP: 105/68 104/70 104/66 104/72  Pulse: 90 86 75 99  Resp: 18 18 18 16   Temp:  97.9 F (36.6 C) 98.1 F (36.7 C) 98.1 F (36.7 C)  TempSrc:  Oral  Oral  SpO2: 100% 93% 96% 100%  Weight:   57.7 kg (127 lb 4.8 oz)   Height:        Intake/Output Summary (Last 24 hours) at 12/29/16 1655 Last data filed at 12/28/16 1800  Gross per 24 hour  Intake              390 ml  Output                0 ml  Net              390 ml   Filed Weights   12/26/16 0528 12/27/16 0516 12/29/16 0522  Weight: 58.1 kg (128 lb 1.6 oz) 57.9 kg (127 lb 11.2 oz) 57.7 kg (127 lb 4.8 oz)    Examination: General exam: Appears comfortable - sitting up in bed in no distress HEENT: PERRLA, oral mucosa moist, no sclera icterus or thrush Respiratory system: Clear to auscultation. Respiratory effort normal. Cardiovascular system: S1 & S2 heard, RRR.  No murmurs  Gastrointestinal system: Abdomen soft, non-tender, nondistended. Normal bowel sound. No organomegaly Central nervous system: Alert and oriented. No focal neurological deficits. Extremities: No cyanosis, clubbing or edema Skin: No rashes or ulcers Psychiatry:  Mood & affect appropriate.     Data Reviewed: I have personally reviewed following labs and imaging  studies  CBC:  Recent Labs Lab 12/24/16 0503 12/27/16 0432  WBC 5.6 5.5  HGB 10.4* 10.1*  HCT 31.1* 30.4*  MCV 87.4 85.4  PLT 188 177   Basic Metabolic Panel:  Recent Labs Lab 12/24/16 0503 12/27/16 0432  NA 134* 136  K 3.9 4.0  CL 103 104  CO2 26 27  GLUCOSE 102* 96  BUN 12 13  CREATININE 0.77 0.85  CALCIUM 9.3 9.2   GFR: Estimated Creatinine Clearance: 83 mL/min (by C-G formula based on SCr of 0.85 mg/dL). Liver Function Tests: No results for input(s): AST, ALT, ALKPHOS, BILITOT, PROT, ALBUMIN in the last 168 hours. No results for input(s): LIPASE, AMYLASE in the last 168 hours. No results for input(s): AMMONIA in the last 168 hours. Coagulation Profile: No results for input(s): INR, PROTIME in the last 168 hours. Cardiac Enzymes: No results for input(s): CKTOTAL, CKMB, CKMBINDEX, TROPONINI in the last 168 hours. BNP (last 3 results) No results for input(s): PROBNP in the last 8760 hours. HbA1C: No results for input(s): HGBA1C in the last 72 hours. CBG: No results for input(s): GLUCAP in the last 168 hours. Lipid Profile: No results for input(s): CHOL, HDL, LDLCALC, TRIG, CHOLHDL, LDLDIRECT in the last 72 hours. Thyroid Function Tests: No results for input(s): TSH, T4TOTAL, FREET4, T3FREE, THYROIDAB in the last 72 hours. Anemia Panel:  Recent Labs  12/27/16 1745  FERRITIN 265  TIBC 342  IRON 41  RETICCTPCT 0.7   Urine analysis:    Component Value Date/Time   COLORURINE YELLOW 11/10/2016 2132   APPEARANCEUR CLEAR 11/10/2016 2132   LABSPEC 1.006 11/10/2016 2132   PHURINE 5.0 11/10/2016 2132   GLUCOSEU NEGATIVE 11/10/2016 2132   HGBUR MODERATE (A) 11/10/2016 2132   BILIRUBINUR NEGATIVE 11/10/2016 2132   KETONESUR NEGATIVE 11/10/2016 2132   PROTEINUR NEGATIVE 11/10/2016 2132   UROBILINOGEN 0.2 08/05/2010 1821   NITRITE NEGATIVE 11/10/2016 2132   LEUKOCYTESUR SMALL (A) 11/10/2016 2132   Sepsis  Labs: @LABRCNTIP (procalcitonin:4,lacticidven:4) ) No results found for this or any previous visit (from the past 240 hour(s)).       Radiology Studies: No results found.    Scheduled Meds: . buprenorphine-naloxone  2 tablet Sublingual Daily  .  busPIRone  10 mg Oral TID  . furosemide  20 mg Oral Once per day on Tue Thu Sat  . gabapentin  400 mg Oral QID  . nicotine  14 mg Transdermal Daily  . potassium chloride  40 mEq Oral Daily  . saccharomyces boulardii  250 mg Oral BID   Continuous Infusions: . sodium chloride 250 mL (12/20/16 1806)  . vancomycin 750 mg (12/29/16 0525)     LOS: 49 days    Time spent in minutes: 35    Calvert Cantor, MD [facility] Pager: [email or web address] Password [email or web address] 12/29/2016, 4:55 PM

## 2016-12-30 MED ORDER — GABAPENTIN 300 MG PO CAPS: 300.0000 mg | ORAL_CAPSULE | Freq: Three times a day (TID) | ORAL | 0 refills | Status: AC

## 2016-12-30 MED ORDER — POTASSIUM CHLORIDE CRYS ER 20 MEQ PO TBCR: 40.0000 meq | EXTENDED_RELEASE_TABLET | Freq: Every day | ORAL | 0 refills | Status: DC | PRN

## 2016-12-30 MED ORDER — BUSPIRONE HCL 10 MG PO TABS: 10.0000 mg | ORAL_TABLET | Freq: Two times a day (BID) | ORAL | 0 refills | Status: AC

## 2016-12-30 MED ORDER — FUROSEMIDE 20 MG PO TABS: 40.0000 mg | ORAL_TABLET | Freq: Every day | ORAL | 0 refills | Status: DC | PRN

## 2016-12-30 MED ORDER — NICOTINE 7 MG/24HR TD PT24: 7.0000 mg | MEDICATED_PATCH | Freq: Every day | TRANSDERMAL | 0 refills | Status: AC

## 2016-12-30 NOTE — Discharge Instructions (Signed)
Weight yourself the same time every day, preferably in the mornings.   Please take all your medications with you for your next visit with your Primary MD. Please request your Primary MD to go over all hospital test results at the follow up. Please ask your Primary MD to get all Hospital records sent to his/her office.  If you experience worsening of your admission symptoms, develop shortness of breath, chest pain, suicidal or homicidal thoughts or a life threatening emergency, you must seek medical attention immediately by calling 911 or calling your MD.  Bonita QuinYou must read the complete instructions/literature along with all the possible adverse reactions/side effects for all the medicines you take including new medications that have been prescribed to you. Take new medicines after you have completely understood and accpet all the possible adverse reactions/side effects.   Do not drive when taking pain medications or sedatives.    Do not take more than prescribed Pain, Sleep and Anxiety Medications  If you have smoked or chewed Tobacco in the last 2 yrs please stop. Stop any regular alcohol and or recreational drug use.  Wear Seat belts while driving.

## 2016-12-30 NOTE — Discharge Summary (Addendum)
Physician Discharge Summary  Savannah Palmer NFA:[id number] DOB: 12-10-1990 DOA: 11/10/2016  PCP: Lindaann Pascal, PA-C  Admit date: 11/10/2016 Discharge date: 12/30/2016  Admitted From: home Disposition:  home   Recommendations for Outpatient Follow-up:  1. F/u ECHO for severe TR 2. Follow [facility] in 1 wk (on Lasix and Potassium)   Discharge Condition:  stable   CODE STATUS:  Full code   Consultations:  ID  Pulmonary  CT surgery   Discharge Diagnoses:  Principal Problem:   Bacterial endocarditis/   MRSA bacteremia Active Problems:   IVDU (intravenous drug user)   Polysubstance (including opioids) dependence, daily use (HCC)   Septic pulmonary embolism (HCC)   Septic shock (HCC)   Encounter for central line placement   Acute Respiratory failure (HCC)  R empyema   Pneumothorax on right   Hypokalemia   Hypophosphatemia   Thrombocytopenia (HCC)   RUQ pain   Right hip pain   Severe Tricuspid valve regurgitation, infectious      Subjective: No complaints. Anxious to go home. All questions answered in detail.   Brief Summary: 26 year old Caucasian female with a past medical history of anxiety, depression & long term Heroin abuse, on methadone in the past(2012), benzodiazepine withdrawal seizures (2012)  presented to [facility] with weakness, cough and chest pain for 4-5 days. Admitted to injecting Heroin and Crack the same day into left arm. Found to be in septic shock and required pressors. Admitted by Critical care service. Blood cultures were positive for MRSA. She was found on TTE on 5/27  to have tricuspid valve endocarditis with severe TR and also was found to have septic emboli throughout her lungs. She continue to be febrile and central line was removed.  5/28- Transferred to [facility] service. ID was assisting with management.  6/1 underwent TEE at [facility]. By the time she returned, she was short of breath and hypoxic. Stat CXR revealed a right  sided pneumothorax. Chest tube was placed by PCCM.     Further Hospital Course:   MRSA bacteremia/septic shock/tricuspid valve endocarditis Septic emboli to lungs & MRSA empyema:    5/27- 2-D echo - showed a tricuspid vegetation with Moderate TR- -6/1-  TEE shows a large tricuspid vegetation, severe TR with dilated RV along with a loculated left effusion-   Dr Cornelius Moras  reviewed the TEE and CT scan and did not feel there is a loculated fluid collection present.   - 6/1- R chest tube by PCCM - 6/3-  Dr Cornelius Moras placed second chest tube as initial was not adequately drainging - Pleural fluid culture>> MRSA HIV, hepatitis C and RPR were negative. P -  initially placed on vancomycin- level subtherapeutic and she had persistent bacteremia - she was started on daptomycin and ceftazidime in addition to Vanc - Vanc dosage adjusted and eventually transitioned back to vancomycin only -  PICC line was placed on 6/9.  - Vancomycin continued through July 15 - has completed course and is ready to discharge home  Right spontaneous pneumothorax: - occurring after TEE - Seen by pulmonology as well as cardiothoracic. Chest tube was placed and then removed.    Anxiety - Patient has significant anxiety and per family is primary reason for patient's relapse with drug abuse. - cont Buspar TID  Acute kidney injury - Baseline creatinine was 0.7. Increased to 1.37. Thought to be secondary to dehydration and NSAIDs -  Now improved.  Right upper quadrant abdominal pain.  CT scan of the abdomen  pelvis did not show any acute abnormalities. Pain thought to be secondary to musculoskeletal in origin. Now resolved.  Ascites/anasarca due to right heart failure. - This is thought to be a result of severe TR.  - Patient was placed on Lasix 3 x week and is euvolemic - can use Lasix daily PRN at home - have given parameters  Splenomegaly. - Detected on CT scan - CT on 5/30 revealed that the spleen is enlarged  measuring 15.7 x 5.6 x 17 cm (volume= 780 cm^3) - liver is unremarkable on CT-  Normocytic anemia  - Hemoglobin stable at 10-11 range - Last transfused on 6/5. -   Iron panel shows sufficient iron level  Hypokalemia/hypophosphatemia/hypomagnesemia. Resolved.  Thrombocytopenia. Resolved  Heroin abuse - has been given Suboxone during the hospital stay - Will transition to a drug rehabilitation program once stable. Social worker has provided resources   Anxiety - cont Buspar& Neurontin   Tobacco abuse Continue nicotine patch  Discharge Instructions  Discharge Instructions    (HEART FAILURE PATIENTS) Call MD:  Anytime you have any of the following symptoms: 1) 3 pound weight gain in 24 hours or 5 pounds in 1 week 2) shortness of breath, with or without a dry hacking cough 3) swelling in the hands, feet or stomach 4) if you have to sleep on extra pillows at night in order to breathe.    Complete by:  As directed    Diet - low sodium heart healthy    Complete by:  As directed    Increase activity slowly    Complete by:  As directed      Allergies as of 12/30/2016   No Known Allergies     Medication List    STOP taking these medications   SUBOXONE 8-2 MG Film- will obtain prescription from Suboxone clinic  Generic drug:  Buprenorphine HCl-Naloxone HCl     TAKE these medications   busPIRone 10 MG tablet Commonly known as:  BUSPAR Take 1 tablet (10 mg total) by mouth 2 (two) times daily.   furosemide 20 MG tablet Commonly known as:  LASIX Take 2 tablets (40 mg total) by mouth daily as needed for fluid or edema. For a 2 lb weight gain in 24 hrs.   gabapentin 300 MG capsule Commonly known as:  NEURONTIN Take 1 capsule (300 mg total) by mouth 3 (three) times daily.   nicotine 7 mg/24hr patch Commonly known as:  NICODERM CQ - dosed in mg/24 hr Place 1 patch (7 mg total) onto the skin daily.   potassium chloride SA 20 MEQ tablet Commonly known as:   K-DUR,KLOR-CON Take 2 tablets (40 mEq total) by mouth daily as needed (with Lasix).      Follow-up Information    Purcell Nails, MD Follow up.   Specialty:  Cardiothoracic Surgery Why:  CT surgery regarding heart valve. Contact information: [address] [place] [address] [phone]          No Known Allergies   Procedures/Studies: 2 D ECHO 11/11/16 There is an echodensity attached to the posterior leaflet of the   tricuspid valve measuring 14 x 11 mm consistent with a   vegetation. No vegetation was seen on the aortic, mitra or   pulmonic valves.   TEE 6/1  - Left ventricle: The cavity size was normal. Wall thickness was   normal. Systolic function was normal. The estimated ejection   fraction was in the range of 55% to 60%.  Wall motion was normal;   there were no regional wall motion abnormalities. - Left atrium: No evidence of thrombus in the atrial cavity or   appendage. - Right ventricle: The cavity size was mildly dilated. Wall   thickness was normal. - Right atrium: No evidence of thrombus in the atrial cavity or   appendage. - Tricuspid valve: Flail motion of the posterior leaflet. There is   a 8x5 mm vegetation attached to the posterior leaflet, but most   of the &quot;mass&quot; described on transthoracic imaging appears to be   the flail segment of the valve. There was severe regurgitation   directed eccentrically and toward the free wall.  Chest tube placement PICC line placement  Discharge Exam: Vitals:   12/29/16 2100 12/30/16 0300  BP: 102/76 (!) 92/59  Pulse: 91 93  Resp: 16 16  Temp: 98.1 F (36.7 C) 98.5 F (36.9 C)   Vitals:   12/29/16 1406 12/29/16 2100 12/30/16 0300 12/30/16 0700  BP: 104/72 102/76 (!) 92/59   Pulse: 99 91 93   Resp: 16 16 16    Temp: 98.1 F (36.7 C) 98.1 F (36.7 C) 98.5 F (36.9 C)   TempSrc: Oral Oral Oral   SpO2: 100% 100% 100%   Weight:    59.1 kg (130 lb 3.2 oz)  Height:         General: Pt is alert, awake, not in acute distress Cardiovascular: RRR, S1/S2 +, no rubs, no gallops Respiratory: CTA bilaterally, no wheezing, no rhonchi Abdominal: Soft, NT, ND, bowel sounds + Extremities: no edema, no cyanosis    The results of significant diagnostics from this hospitalization (including imaging, microbiology, ancillary and laboratory) are listed below for reference.     Microbiology: No results found for this or any previous visit (from the past 240 hour(s)).   Labs: BNP (last 3 results) No results for input(s): BNP in the last 8760 hours. Basic Metabolic Panel:  Recent Labs Lab 12/24/16 0503 12/27/16 0432  NA 134* 136  K 3.9 4.0  CL 103 104  CO2 26 27  GLUCOSE 102* 96  BUN 12 13  CREATININE 0.77 0.85  CALCIUM 9.3 9.2   Liver Function Tests: No results for input(s): AST, ALT, ALKPHOS, BILITOT, PROT, ALBUMIN in the last 168 hours. No results for input(s): LIPASE, AMYLASE in the last 168 hours. No results for input(s): AMMONIA in the last 168 hours. CBC:  Recent Labs Lab 12/24/16 0503 12/27/16 0432  WBC 5.6 5.5  HGB 10.4* 10.1*  HCT 31.1* 30.4*  MCV 87.4 85.4  PLT 188 177   Cardiac Enzymes: No results for input(s): CKTOTAL, CKMB, CKMBINDEX, TROPONINI in the last 168 hours. BNP: Invalid input(s): POCBNP CBG: No results for input(s): GLUCAP in the last 168 hours. D-Dimer No results for input(s): DDIMER in the last 72 hours. Hgb A1c No results for input(s): HGBA1C in the last 72 hours. Lipid Profile No results for input(s): CHOL, HDL, LDLCALC, TRIG, CHOLHDL, LDLDIRECT in the last 72 hours. Thyroid function studies No results for input(s): TSH, T4TOTAL, T3FREE, THYROIDAB in the last 72 hours.  Invalid input(s): FREET3 Anemia work up  Recent Labs  12/27/16 1745  FERRITIN 265  TIBC 342  IRON 41  RETICCTPCT 0.7   Urinalysis    Component Value Date/Time   COLORURINE YELLOW 11/10/2016 2132   APPEARANCEUR CLEAR 11/10/2016  2132   LABSPEC 1.006 11/10/2016 2132   PHURINE 5.0 11/10/2016 2132   GLUCOSEU NEGATIVE 11/10/2016 2132   HGBUR MODERATE (A)  11/10/2016 2132   BILIRUBINUR NEGATIVE 11/10/2016 2132   KETONESUR NEGATIVE 11/10/2016 2132   PROTEINUR NEGATIVE 11/10/2016 2132   UROBILINOGEN 0.2 08/05/2010 1821   NITRITE NEGATIVE 11/10/2016 2132   LEUKOCYTESUR SMALL (A) 11/10/2016 2132   Sepsis Labs Invalid input(s): PROCALCITONIN,  WBC,  LACTICIDVEN Microbiology No results found for this or any previous visit (from the past 240 hour(s)).   Time coordinating discharge: Over 30 minutes  SIGNED:   Calvert Cantor, MD  [facility] 12/30/2016, 11:14 AM Pager   If 7PM-7AM, please contact night-coverage [email or web address] Password [email or web address]

## 2017-02-01 ENCOUNTER — Other Ambulatory Visit: Payer: Self-pay | Admitting: Thoracic Surgery (Cardiothoracic Vascular Surgery)

## 2017-02-01 DIAGNOSIS — I330 Acute and subacute infective endocarditis: Secondary | ICD-10-CM

## 2017-02-04 ENCOUNTER — Ambulatory Visit
Admission: RE | Admit: 2017-02-04 | Discharge: 2017-02-04 | Disposition: A | Discharge status: Home / Self Care | Payer: BLUE CROSS/BLUE SHIELD | Source: Ambulatory Visit | Attending: Thoracic Surgery (Cardiothoracic Vascular Surgery) | Admitting: Thoracic Surgery (Cardiothoracic Vascular Surgery)

## 2017-02-04 ENCOUNTER — Encounter: Payer: Self-pay | Admitting: Thoracic Surgery (Cardiothoracic Vascular Surgery)

## 2017-02-04 ENCOUNTER — Ambulatory Visit (INDEPENDENT_AMBULATORY_CARE_PROVIDER_SITE_OTHER): Payer: BLUE CROSS/BLUE SHIELD | Admitting: Thoracic Surgery (Cardiothoracic Vascular Surgery)

## 2017-02-04 VITALS — BP 114/72 | HR 71 | Resp 18 | Ht 63.0 in | Wt 132.0 lb

## 2017-02-04 DIAGNOSIS — I330 Acute and subacute infective endocarditis: Secondary | ICD-10-CM | POA: Diagnosis not present

## 2017-02-04 DIAGNOSIS — R7881 Bacteremia: Secondary | ICD-10-CM

## 2017-02-04 NOTE — Patient Instructions (Addendum)
Continue all previous medications without any changes at this time  Stop smoking immediately and permanently.  Make plans for long term follow up with regards to substance abuse and narcotic dependence.   Make certain that you have follow up appointments in the Infectious Disease clinic and the Cardiology clinic

## 2017-02-04 NOTE — Progress Notes (Signed)
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  Initially her pneumothorax was treated using a smallbore chest tube placed by the pulmonary critical care team, but only partial reexpansion of the lung was achieved. Cardiothoracic surgery was consulted and a chest tube was placed 11/17/2016. The patient's lung reexpanded without complication and the chest tube was later removed. The patient completed a 6 week course of intravenous vancomycin while she was in the hospital and ultimately was discharged home with her mother. No follow-up appointments were made in the infectious disease clinic or the cardiology clinic. The patient returns to our office for follow-up.  Her mother is with her. She is smoking cigarettes. She states that she is not using drugs.  They are discussing plans for counseling, potentially to include Suboxone therapy. The patient denies any fevers or chills. She has no shortness of breath or chest pain. She has no abdominal bloating or lower extremity edema.   Current Outpatient Prescriptions  Medication Sig Dispense Refill  . busPIRone (BUSPAR) 10 MG tablet Take 1 tablet (10 mg total) by mouth 2 (two) times daily. 60 tablet 0  . furosemide (LASIX) 20 MG tablet Take 2 tablets (40 mg total) by mouth daily as needed for fluid or edema. For a 2 lb weight gain in 24 hrs. 30 tablet 0  . gabapentin (NEURONTIN) 300 MG capsule Take 1 capsule (300 mg total) by mouth 3 (three) times daily. 90 capsule 0  . nicotine (NICODERM CQ - DOSED IN MG/24 HR) 7 mg/24hr patch Place 1 patch (7 mg total) onto  the skin daily. 14 patch 0  . potassium chloride SA (K-DUR,KLOR-CON) 20 MEQ tablet Take 2 tablets (40 mEq total) by mouth daily as needed (with Lasix). 30 tablet 0   No current facility-administered medications for this visit.       Physical Exam:   BP 114/72   Pulse 71   Resp 18   Ht 5\' 3"  (1.6 m)   Wt 132 lb (59.9 kg)   LMP 01/28/2017 (Approximate)   SpO2 98% Comment: RA  BMI 23.38 kg/m   General:  Well-appearing  Chest:   Clear to auscultation  CV:   Regular rate and rhythm with soft systolic murmur  Incisions:  Completely healed  Abdomen:  Soft nontender  Extremities:  Warm and well-perfused  Diagnostic Tests:  CHEST  2 VIEW  COMPARISON:  PA and lateral chest x-ray of November 23, 2016  FINDINGS: The lungs are adequately inflated. There is no focal infiltrate. The interstitial markings have normalized. There is no pleural effusion. The heart and pulmonary vascularity are normal. The mediastinum is normal in width.  IMPRESSION: There is no acute cardiopulmonary abnormality.   Electronically Signed   By: David  [place] M.D.   On: 02/04/2017 14:00   Impression:  Resolution of spontaneous pneumothorax without complication. Patient has not been seen in follow-up by infectious disease team nor the cardiology team since hospital discharge.  She denies any symptoms suggestive of right sided congestive heart failure. Long-term prognosis will likely  be determined by whether not she can find a way to cure her problems with substance abuse.  Plan:  In the future the patient will call and return to see [place] only should specific problems or questions arise.  She has been advised to schedule follow-up appointments in the infectious disease clinic and the cardiology clinic. She has been advised to find a way to quit smoking in the importance of finding a way to stay completely free of narcotic use has been emphasized.  I spent in excess of 15 minutes during the conduct of this  office consultation and >50% of this time involved direct face-to-face encounter with the patient for counseling and/or coordination of their care.    Salvatore Decent. Cornelius Moras, MD 02/04/2017 2:25 PM

## 2017-02-06 ENCOUNTER — Encounter: Payer: Self-pay | Admitting: Infectious Diseases

## 2017-02-06 ENCOUNTER — Ambulatory Visit (INDEPENDENT_AMBULATORY_CARE_PROVIDER_SITE_OTHER): Payer: BLUE CROSS/BLUE SHIELD | Admitting: Infectious Diseases

## 2017-02-06 ENCOUNTER — Telehealth: Payer: Self-pay

## 2017-02-06 VITALS — BP 99/65 | HR 66 | Temp 98.2°F | Wt 131.0 lb

## 2017-02-06 DIAGNOSIS — J189 Pneumonia, unspecified organism: Secondary | ICD-10-CM

## 2017-02-06 DIAGNOSIS — I330 Acute and subacute infective endocarditis: Secondary | ICD-10-CM | POA: Diagnosis not present

## 2017-02-06 DIAGNOSIS — F1990 Other psychoactive substance use, unspecified, uncomplicated: Secondary | ICD-10-CM

## 2017-02-06 DIAGNOSIS — R768 Other specified abnormal immunological findings in serum: Secondary | ICD-10-CM

## 2017-02-06 DIAGNOSIS — I071 Rheumatic tricuspid insufficiency: Secondary | ICD-10-CM | POA: Diagnosis not present

## 2017-02-06 DIAGNOSIS — J984 Other disorders of lung: Secondary | ICD-10-CM | POA: Diagnosis not present

## 2017-02-06 DIAGNOSIS — B9562 Methicillin resistant Staphylococcus aureus infection as the cause of diseases classified elsewhere: Secondary | ICD-10-CM

## 2017-02-06 DIAGNOSIS — R7881 Bacteremia: Secondary | ICD-10-CM | POA: Diagnosis not present

## 2017-02-06 NOTE — Progress Notes (Signed)
Patient Name: Savannah Palmer  MRN: [id number]  DOB: 07-16-1990    Reason for Visit: hospital follow up for MRSA TV IE of native valve   INFECTIOUS DISEASE OFFICE VISIT SUBJECTIVE:  HPI: Savannah Palmer is a 26 y.o. female that is here today with her mother for follow up on her MRSA  endocarditis of the tricuspid valve with septic pulmonary emboli in setting of IVDU.   She has completed 6 weeks of IV therapy - daptomycin + ceftaroline to cover pulmonary component and subtherapeutic trough levels of vanc; later transitioned back to Vancomycin with last dose 12/30/16 in the hospital setting in consideration of her safety and recent IVD use. Still smoking cigarettes but has been free of other drugs. Is planning on starting Chantix soon to assist with cessation. Regarding heroin, she has attempted rehab 3 times in the past with medication assisted treatment. Mom wants to take a new approach and take her daughter with her back to [place] with a long car trip to travel as she is not sure conventional therapy is best for her. Denies fevers or chills. PICC is out and site is healed. No chest pain, leg swelling, abdominal bloating, shortness of breath or orthopnea. She had follow up with Dr. Cornelius Moras yesterday at [facility] office but has not been seen by cardiology team yet.    Patient's Medications  New Prescriptions   No medications on file  Previous Medications   BUSPIRONE (BUSPAR) 10 MG TABLET    Take 1 tablet (10 mg total) by mouth 2 (two) times daily.   FUROSEMIDE (LASIX) 20 MG TABLET    Take 2 tablets (40 mg total) by mouth daily as needed for fluid or edema. For a 2 lb weight gain in 24 hrs.   GABAPENTIN (NEURONTIN) 300 MG CAPSULE    Take 1 capsule (300 mg total) by mouth 3 (three) times daily.   NICOTINE (NICODERM CQ - DOSED IN MG/24 HR) 7 MG/24HR PATCH    Place 1 patch (7 mg total) onto the skin daily.   POTASSIUM CHLORIDE SA (K-DUR,KLOR-CON) 20 MEQ TABLET    Take 2 tablets (40 mEq total) by mouth  daily as needed (with Lasix).  Modified Medications   No medications on file  Discontinued Medications   No medications on file    Review of Systems  Constitutional: Negative for chills, fever and malaise/fatigue.  HENT: Negative for tinnitus.   Respiratory: Negative for cough, sputum production and shortness of breath.   Cardiovascular: Negative for chest pain, palpitations, orthopnea and leg swelling.  Gastrointestinal: Negative for abdominal pain, diarrhea and vomiting.  Genitourinary: Negative for dysuria.  Musculoskeletal: Negative for back pain.  Skin: Negative for rash.  Neurological: Negative for dizziness and headaches.     Past Medical History:  Diagnosis Date  . Anxiety   . Bacterial endocarditis    MRSA sepsis with tricuspid valve endocarditis  . Depression   . Heart murmur   . IVDU (intravenous drug user)   . Septic pulmonary embolism (HCC)   . Smoker   . Tricuspid valve regurgitation, infectious     Social History  Substance Use Topics  . Smoking status: Current Some Day Smoker    Packs/day: 1.00    Types: Cigarettes  . Smokeless tobacco: Never Used  . Alcohol use Yes     Comment: occasinally socially    No Known Allergies   OBJECTIVE: Vitals:   02/06/17 1516  BP: 99/65  Pulse: 66  Temp: 98.2  F (36.8 C)  TempSrc: Oral  Weight: 131 lb (59.4 kg)   Body mass index is 23.21 kg/m.  Physical Exam  Constitutional: She is oriented to person, place, and time and well-developed, well-nourished, and in no distress.  Neck: No JVD present.  Cardiovascular: Normal rate, regular rhythm and normal heart sounds.   Soft systolic murmur at RSB heard   Pulmonary/Chest: Effort normal and breath sounds normal. No respiratory distress. She has no rales.  Neurological: She is alert and oriented to person, place, and time.  Skin: Skin is warm and dry. No rash noted.  Psychiatric: Mood and affect normal.     ASSESSMENT & PLAN:  Problem List Items Addressed  This Visit      Cardiovascular and Mediastinum   Bacterial endocarditis    Will arrange for follow up with cardiology team as soon as possible. Will re-order echo to evaluate TV as she still has a soft murmur. We had frank discussion together today to discuss that the course of treatment she has received has a good chance of cure as long as she abstains from continued IVDU. She still has a murmur so I explained to her that she may have some valve/leaflet defect that is high risk to re-adhere to bacteria in the blood causing this problem again in the future where antibiotics will not be enough to fix her. Will assess surveillance blood cultures for good measure today as she had prolonged bacteremia and secondary complications with pulmonary emboli. Barring her cultures are negative she can follow up with our team PRN as she does not have any indications warranting oral therapy.       Relevant Orders   Ambulatory referral to Cardiology   ECHOCARDIOGRAM COMPLETE   Culture, blood (single)   Culture, blood (single)   Tricuspid valve regurgitation, infectious   RESOLVED: Endocarditis - Primary   Relevant Orders   Ambulatory referral to Cardiology   ECHOCARDIOGRAM COMPLETE   Culture, blood (single)   Culture, blood (single)     Respiratory   RESOLVED: Cavitary pneumonia     Other   IVDU (intravenous drug user)    Counseled as above. Informed her that although she failed rehab in the past she should still strongly consider medication assisted treatment for her substance abuse as this typically proves a higher long-term success rate with lower risk of relapse. Will think about it.       Hepatitis C antibody test positive    Ab + in 2017. No detectable RNA with recent hospitalization.       MRSA bacteremia   Relevant Orders   Culture, blood (single)   Culture, blood (single)     I spent 30 minutes with the patient today 50% of which was spend in face to face counseling of the patient  regarding drug use and need to quit, nature of infective endocarditis and future surgical need should she have recurrence.   Rexene Alberts, MSN, [facility]  02/06/2017  10:48 PM

## 2017-02-06 NOTE — Telephone Encounter (Addendum)
Called [facility] to make an appointment for Savannah Palmer, She has an appointment on September 11,2018 at 8:30 for a hospital follup. I informed Ms. Savannah Palmer about the appointment and she confirmed the appointment back to me. Lorenso Courier, [place]

## 2017-02-06 NOTE — Assessment & Plan Note (Addendum)
Will arrange for follow up with cardiology team as soon as possible. Will re-order echo to evaluate TV as she still has a soft murmur. We had frank discussion together today to discuss that the course of treatment she has received has a good chance of cure as long as she abstains from continued IVDU. She still has a murmur so I explained to her that she may have some valve/leaflet defect that is high risk to re-adhere to bacteria in the blood causing this problem again in the future where antibiotics will not be enough to fix her. Will assess surveillance blood cultures for good measure today as she had prolonged bacteremia and secondary complications with pulmonary emboli. Barring her cultures are negative she can follow up with our team PRN as she does not have any indications warranting oral therapy.

## 2017-02-06 NOTE — Telephone Encounter (Deleted)
The appointment for September 11th is hospital follow up and not an Echocardiogram

## 2017-02-06 NOTE — Assessment & Plan Note (Signed)
Counseled as above. Informed her that although she failed rehab in the past she should still strongly consider medication assisted treatment for her substance abuse as this typically proves a higher long-term success rate with lower risk of relapse. Will think about it.

## 2017-02-06 NOTE — Assessment & Plan Note (Signed)
Ab + in 2017. No detectable RNA with recent hospitalization.

## 2017-02-06 NOTE — Patient Instructions (Addendum)
You look great today.  We will recheck your blood cultures today to make sure infection is cleared.   I hope you continue to do do well :)

## 2017-02-07 ENCOUNTER — Telehealth: Payer: Self-pay

## 2017-02-07 ENCOUNTER — Telehealth: Payer: Self-pay | Admitting: Physician Assistant

## 2017-02-07 NOTE — Telephone Encounter (Signed)
I called [facility] to make an appointment for an Echocardiogram and was informed that [place] has appointment on 02/08/2017 at 3pm. Lorenso Courier, CMA

## 2017-02-07 NOTE — Telephone Encounter (Signed)
Received records from [facility] for appointment on 02/26/17 with Theodore Demark, PA.  Records put with Rhonda's schedule for 02/26/17. lp

## 2017-02-08 ENCOUNTER — Other Ambulatory Visit (HOSPITAL_COMMUNITY): Payer: Self-pay

## 2017-02-12 ENCOUNTER — Other Ambulatory Visit: Payer: Self-pay

## 2017-02-12 ENCOUNTER — Ambulatory Visit (HOSPITAL_COMMUNITY): Payer: BLUE CROSS/BLUE SHIELD | Attending: Cardiology

## 2017-02-12 ENCOUNTER — Other Ambulatory Visit (HOSPITAL_COMMUNITY): Payer: Self-pay

## 2017-02-12 DIAGNOSIS — I071 Rheumatic tricuspid insufficiency: Secondary | ICD-10-CM | POA: Insufficient documentation

## 2017-02-12 DIAGNOSIS — B1920 Unspecified viral hepatitis C without hepatic coma: Secondary | ICD-10-CM | POA: Insufficient documentation

## 2017-02-12 DIAGNOSIS — F1110 Opioid abuse, uncomplicated: Secondary | ICD-10-CM | POA: Insufficient documentation

## 2017-02-12 DIAGNOSIS — I330 Acute and subacute infective endocarditis: Secondary | ICD-10-CM

## 2017-02-12 LAB — CULTURE, BLOOD (SINGLE): ORGANISM ID, BACTERIA: NO GROWTH

## 2017-02-12 MED FILL — SUBOXONE 8 MG-2 MG SL FILM: 8-2 | 30 days supply | Qty: 75 | Fill #0

## 2017-02-14 LAB — CULTURE, BLOOD (SINGLE): ORGANISM ID, BACTERIA: NO GROWTH

## 2017-02-19 ENCOUNTER — Telehealth: Payer: Self-pay | Admitting: Infectious Diseases

## 2017-02-19 NOTE — Telephone Encounter (Signed)
Called and discussed echo results with her mother. Informed her that the regurgitation was improved but still residual vegetation on valve. BCx results shared with her as well - negative x 2 sets. Re-emphasized again that [place] is at a high risk for re-infection since there is residual material on her valve. Advised as needed follow up with ID clinic and advised to continue follow up with cardiology team.   Rexene AlbertsStephanie Mykayla Brinton, NP

## 2017-02-19 NOTE — Progress Notes (Signed)
Still with vegetation on echo which explains her murmur. With lack of fevers and negative BCx x 2 sets not infectious. She is at very high risk for re-infection should she go back to IVDU.

## 2017-02-26 ENCOUNTER — Ambulatory Visit (INDEPENDENT_AMBULATORY_CARE_PROVIDER_SITE_OTHER): Payer: BLUE CROSS/BLUE SHIELD | Admitting: Physician Assistant

## 2017-02-26 ENCOUNTER — Encounter: Payer: Self-pay | Admitting: Physician Assistant

## 2017-02-26 VITALS — BP 100/68 | HR 74 | Ht 63.0 in | Wt 129.0 lb

## 2017-02-26 DIAGNOSIS — I330 Acute and subacute infective endocarditis: Secondary | ICD-10-CM | POA: Diagnosis not present

## 2017-02-26 MED ORDER — POTASSIUM CHLORIDE CRYS ER 20 MEQ PO TBCR: 40.0000 meq | EXTENDED_RELEASE_TABLET | Freq: Every day | ORAL | 2 refills | Status: AC | PRN

## 2017-02-26 MED ORDER — FUROSEMIDE 20 MG PO TABS: 40.0000 mg | ORAL_TABLET | Freq: Every day | ORAL | 2 refills | Status: DC | PRN

## 2017-02-26 MED ORDER — FUROSEMIDE 20 MG PO TABS: 40.0000 mg | ORAL_TABLET | Freq: Every day | ORAL | 2 refills | Status: AC | PRN

## 2017-02-26 MED ORDER — AMOXICILLIN 500 MG PO CAPS: 2000.0000 mg | ORAL_CAPSULE | Freq: Once | ORAL | 2 refills | Status: AC

## 2017-02-26 NOTE — Progress Notes (Addendum)
Cardiology Office Note   Date:  02/26/2017   ID:  Savannah Palmer, DOB Jun 13, 1991, MRN [id number]  PCP:  Lindaann Pascal, PA-C  Cardiologist:  Dr Royann Shivers - he did her TEE 11/2016  Theodore Demark, PA-C   Chief Complaint  Patient presents with  . Follow-up    NO COMPLAINTS.    History of Present Illness: Savannah Palmer is a 26 y.o. female with a history of bacteremia, MRSA sepsis with tricuspid valve vegetation, anxiety, depression, IV drug use, septic pulmonary embolism, tobacco use  11/2016 hospitalization TEE was performed and discovered the tricuspid valve vegetation, 8 x 5 mm. 02/12/2017 2-D echocardiogram showed bun by 1 x 1 cm vegetation.  Savannah Palmer presents for cardiology follow up. She is here with her mother. OK to speak freely in front of her.  She occasionally has abd tightness and weight gain. She then takes the Lasix, K+ and it resolves. She does not watch the salt in her diet, admits she likes salty foods.   She is not having fevers or chills.   Lifestyle is much better. She is not using drugs. She is not smoking. She is not around the people she Used to do that with.  She is not having chest pain or SOB. She is not having chronic edema, denies orthopnea or PND. No increased DOE.    Past Medical History:  Diagnosis Date  . Anxiety   . Bacterial endocarditis    MRSA sepsis with tricuspid valve endocarditis  . Depression   . Heart murmur   . IVDU (intravenous drug user)   . Septic pulmonary embolism (HCC)   . Smoker   . Tricuspid valve regurgitation, infectious     Past Surgical History:  Procedure Laterality Date  . CHOLECYSTECTOMY    . TEE WITHOUT CARDIOVERSION N/A 11/16/2016   Procedure: TRANSESOPHAGEAL ECHOCARDIOGRAM (TEE);  Surgeon: Thurmon Fair, MD;  Location: [facility] ENDOSCOPY;  Service: Cardiovascular;  Laterality: N/A;    Current Outpatient Prescriptions  Medication Sig Dispense Refill  . busPIRone (BUSPAR) 10 MG tablet Take 1 tablet  (10 mg total) by mouth 2 (two) times daily. 60 tablet 0  . furosemide (LASIX) 20 MG tablet Take 2 tablets (40 mg total) by mouth daily as needed for fluid or edema. For a 2 lb weight gain in 24 hrs. 30 tablet 0  . gabapentin (NEURONTIN) 300 MG capsule Take 1 capsule (300 mg total) by mouth 3 (three) times daily. 90 capsule 0  . nicotine (NICODERM CQ - DOSED IN MG/24 HR) 7 mg/24hr patch Place 1 patch (7 mg total) onto the skin daily. 14 patch 0  . potassium chloride SA (K-DUR,KLOR-CON) 20 MEQ tablet Take 2 tablets (40 mEq total) by mouth daily as needed (with Lasix). 30 tablet 0   No current facility-administered medications for this visit.     Allergies:   Patient has no known allergies.    Social History:  The patient  reports that she has been smoking Cigarettes.  She has been smoking about 1.00 pack per day, but has recently quit. She has never used smokeless tobacco. She reports that she drinks alcohol, has not had alcohol recently. She reports that she does not use drugs.   Family History:   Family History  Problem Relation Age of Onset  . Tuberculosis Neg Hx   . Diabetes Mellitus II Neg Hx   . CAD Neg Hx    Family Status  Relation Status  . Mother Alive  .  Father Alive  . Neg Hx (Not Specified)    ROS:  Please see the history of present illness. All other systems are reviewed and negative.    PHYSICAL EXAM: VS:  BP 100/68   Pulse 74   Ht  (1.6 m)   Wt 129 lb (58.5 kg)   LMP 01/28/2017 (Approximate)   BMI 22.85 kg/m  , BMI Body mass index is 22.85 kg/m. GEN: Well nourished, well developed, female in no acute distress  HEENT: normal for age  Neck: no JVD, no carotid bruit, no masses Cardiac: RRR; Very soft murmur, no rubs, or gallops Respiratory:  clear to auscultation bilaterally, normal work of breathing GI: soft, nontender, nondistended, + BS MS: no deformity or atrophy; no edema; distal pulses are 2+ in all 4 extremities   Skin: warm and dry, no  rash Neuro:  Strength and sensation are intact Psych: euthymic mood, full affect   EKG:  EKG is not ordered today.  ECHO: 02/12/2017 - Left ventricle: The cavity size was normal. Wall thickness was   normal. The estimated ejection fraction was 55%. Wall motion was   normal; there were no regional wall motion abnormalities. Left   ventricular diastolic function parameters were normal. - Aortic valve: There was no stenosis. - Mitral valve: There was no significant regurgitation. - Right ventricle: The cavity size was normal. Systolic function   was normal. - Tricuspid valve: There is a small (1 x 1 cm), highly mobile   vegetation attached to the tricuspid valve. Peak RV-RA gradient   (S): 26 mm Hg. - Pulmonary arteries: PA peak pressure: 29 mm Hg (S). - Inferior vena cava: The vessel was normal in size. The   respirophasic diameter changes were in the normal range (>= 50%),   consistent with normal central venous pressure. Impressions: - Normal LV size with EF 55%. Normal diastolic function. Normal RV   size and systolic function. Mild tricuspid regurgitation. Small,   highly mobile vegetation attached to the tricuspid valve.    Recent Labs: 11/21/2016: ALT 8; Magnesium 1.7 12/27/2016: BUN 13; Creatinine, Ser 0.85; Hemoglobin 10.1; Platelets 177; Potassium 4.0; Sodium 136    Lipid Panel No results found for: CHOL, TRIG, HDL, CHOLHDL, VLDL, LDLCALC, LDLDIRECT   Wt Readings from Last 3 Encounters:  02/26/17 129 lb (58.5 kg)  02/06/17 131 lb (59.4 kg)  02/04/17 132 lb (59.9 kg)     Other studies Reviewed: Additional studies/ records that were reviewed today include: Hospital records and testing.  ASSESSMENT AND PLAN:  1.  Tricuspid valve vegetation: I reviewed the results of the recent 2-D echocardiogram with Dr. Royann Shivers. He states that the resolution is poor enough on the transthoracic echocardiogram that the size difference in the vegetation is meaningless. He pointed  out that approximately one third of the time, the vegetation remains in does not resolve once antibiotics are completed.  She is not having any symptoms of ongoing infection. I explained that the vegetation gives bacteria an extra opportunity to grow. I advised that any time she had a procedure, she needs to make sure that she doesn't need pretreatment with antibiotics. Of note, she needs to get her teeth cleaned. She was given a prescription for amoxicillin to be taken preprocedure.  I do not think she has true heart failure. I think that she is sodium sensitive. I renewed the prescription for Lasix and potassium, but do not feel that she needs to take this very often. I feel if she limits  the sodium in her diet, she will not have any problems with fluid retention.   Current medicines are reviewed at length with the patient today.  The patient does not have concerns regarding medicines.  The following changes have been made:  Renewed Lasix and potassium, add prescription for preprocedure antibiotics.  Labs/ tests ordered today include:  No orders of the defined types were placed in this encounter.    Disposition:   FU with Dr. Royann Shiversroitoru  Signed, Theodore DemarkBarrett, Zeplin Aleshire, PA-C  02/26/2017 1:14 PM    [facility] Phone: [phone]; Fax: [phone]  This note was written with the assistance of speech recognition software. Please excuse any transcriptional errors.

## 2017-02-26 NOTE — Patient Instructions (Addendum)
Medication Instructions:  NO CHANGES If you need a refill on your cardiac medications before your next appointment, please call your pharmacy.  Follow-Up: Your physician wants you to follow-up in: 3 MONTHS WITH DR [id number].   Special Instructions: AMOXICILLIN 500MG  4 TABLETS 2 HOURS BEFORE DENTAL PROCEDURE-OR- AS DIRECTED   Thank you for choosing [facility]!!

## 2017-02-26 NOTE — Progress Notes (Signed)
Agree. Lifelong endocarditis prophylaxis is indicated. MCr

## 2017-03-19 MED FILL — SUBOXONE 8 MG-2 MG SL FILM: 8-2 | 30 days supply | Qty: 75 | Fill #0

## 2017-04-16 MED FILL — SUBOXONE 8 MG-2 MG SL FILM: 8-2 | 8 days supply | Qty: 18 | Fill #0

## 2017-09-16 DEATH — deceased

## 2017-10-02 IMAGING — DX DG CHEST 1V PORT
1 series · 1 of 1 positions shown · non-contrast
Comparison: Chest x-ray from earlier same day.

CLINICAL DATA: Status post central line placement.

EXAM:
PORTABLE CHEST 1 VIEW

[chest ap]
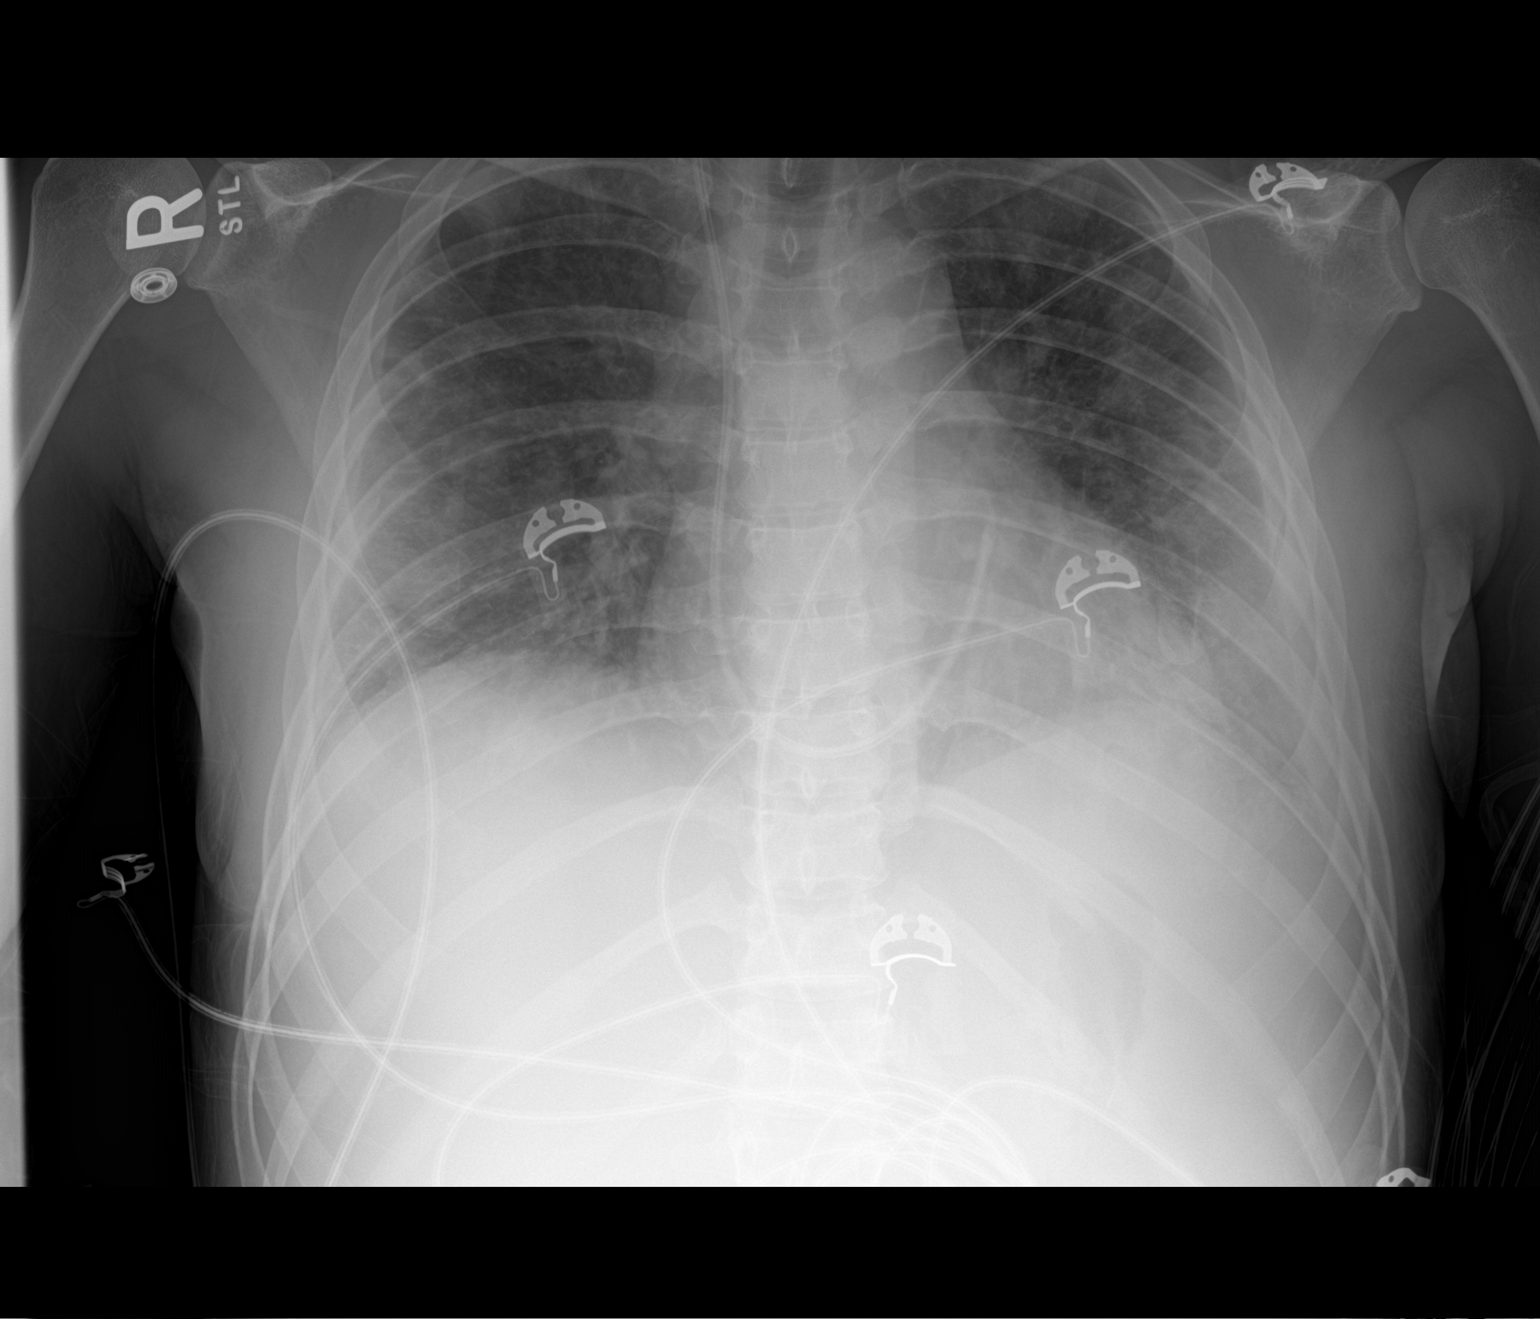

[1 of 1 positions shown; findings below may reference images not displayed]

FINDINGS: Interval placement of a right IJ central line with tip passing to
the left of midline and likely in the right ventricle or main
pulmonary artery.

The bilateral small nodular airspace opacities throughout both lungs
are unchanged in the short-term interval. Suspect small bilateral
pleural effusions. No pneumothorax seen.
IMPRESSION: 1. Right IJ central line placement with tip positioned to the left
of midline either within the right ventricle or main pulmonary
artery. Recommend retracting at least 10 cm.
2. Bilateral small nodular airspace opacities throughout both lungs
are unchanged in the short-term interval. As recommended on earlier
chest x-ray report, would consider chest CT for further
characterization.
3. Suspect small bilateral pleural effusions.

## 2017-10-02 IMAGING — DX DG CHEST 1V PORT
1 series · 1 of 1 positions shown · non-contrast
Comparison: CT chest 11/10/2016.  Chest 11/10/2016.

CLINICAL DATA: Retraction of the IJ line about 10 cm

EXAM:
PORTABLE CHEST 1 VIEW

[chest ap]
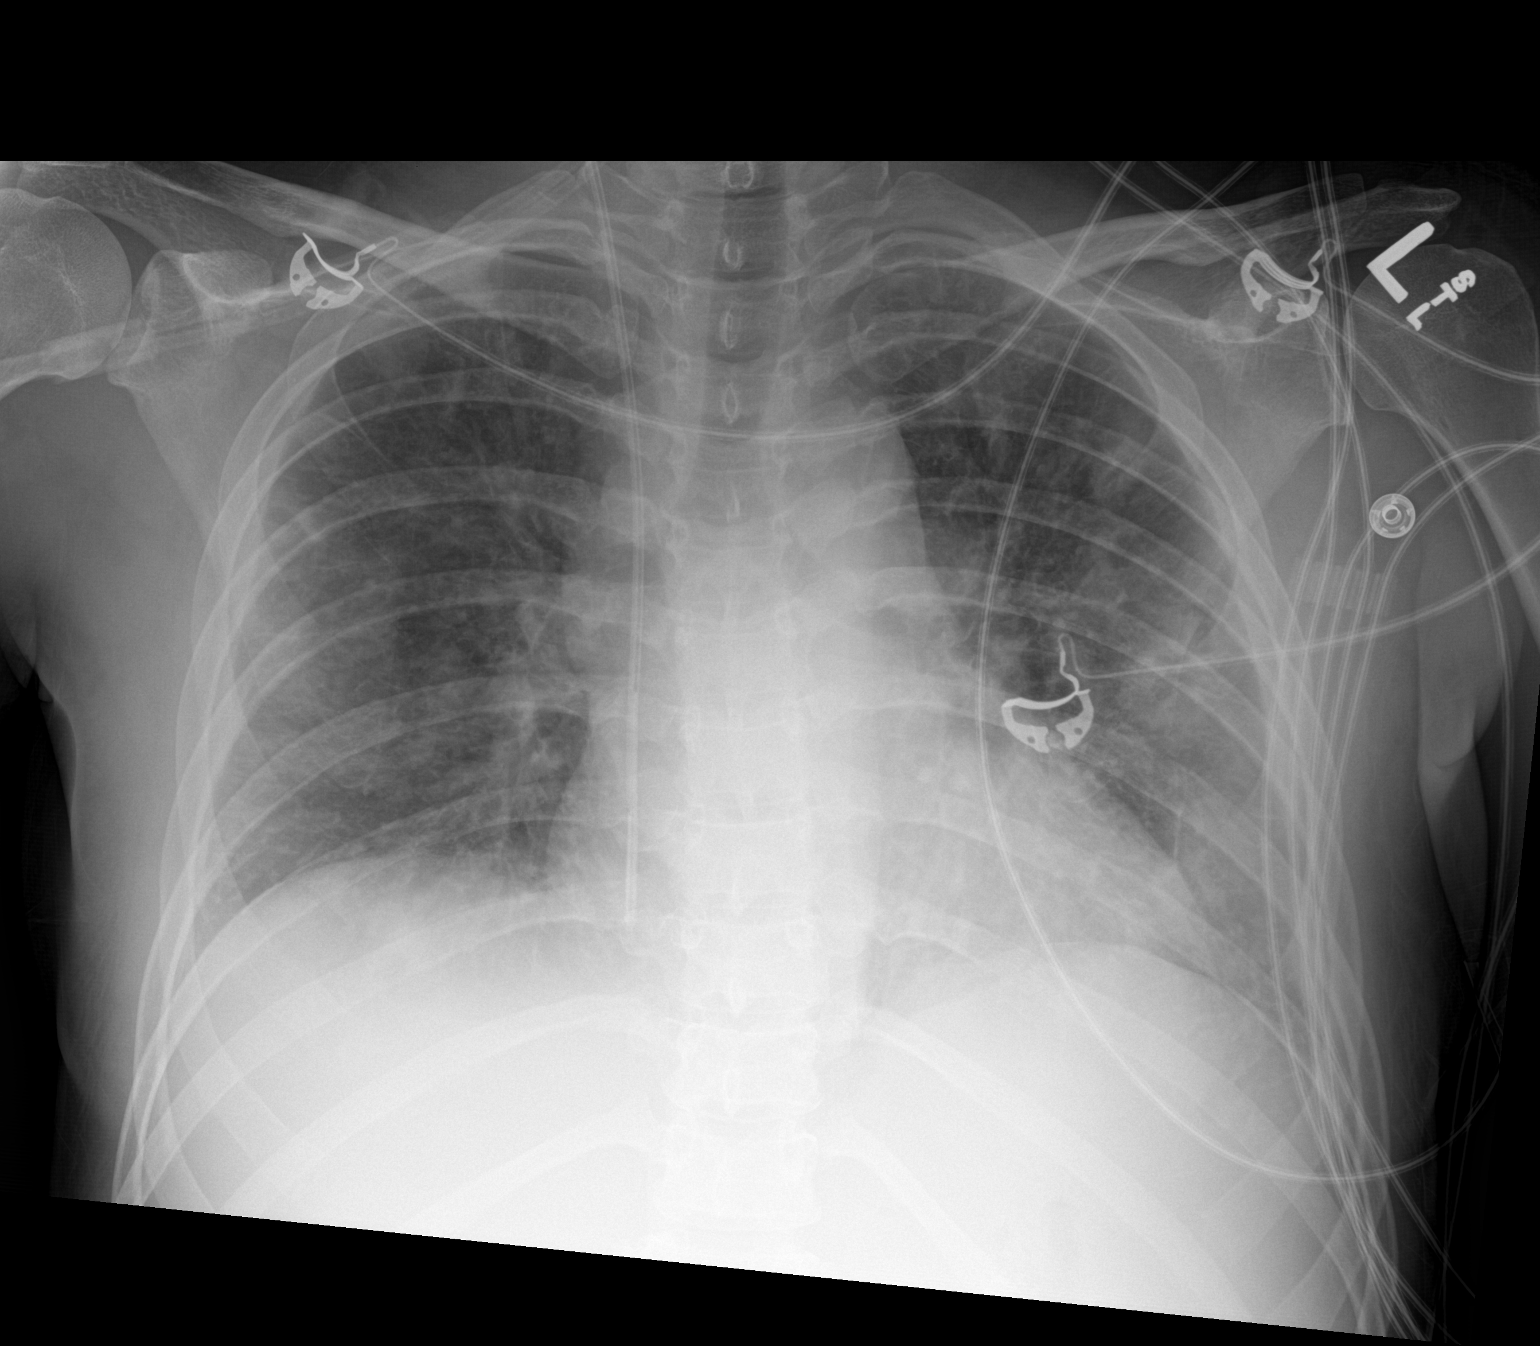

[1 of 1 positions shown; findings below may reference images not displayed]

FINDINGS: The left central venous catheter tip localizes to the mid/distal
right atrial region. If placement at or above the cavoatrial
junction is desired, retraction of about 3-4 cm is recommended. No
pneumothorax.

Shallow inspiration with atelectasis in the lung bases. Borderline
heart size. Patchy airspace infiltrates in both lungs could indicate
edema, aspiration, or pneumonia. No blunting of costophrenic angles.
IMPRESSION: Central venous catheter tip is in the mid/ low right atrial region.
If placement at or above the cavoatrial junction is desired,
retraction of about 3-4 cm is recommended.

## 2017-10-03 IMAGING — DX DG CHEST 1V PORT
1 series · 1 of 1 positions shown · non-contrast
Comparison: Chest radiograph performed 11/10/2016

CLINICAL DATA: Check right IJ line placement.  Initial encounter.

EXAM:
PORTABLE CHEST 1 VIEW

[chest ap]
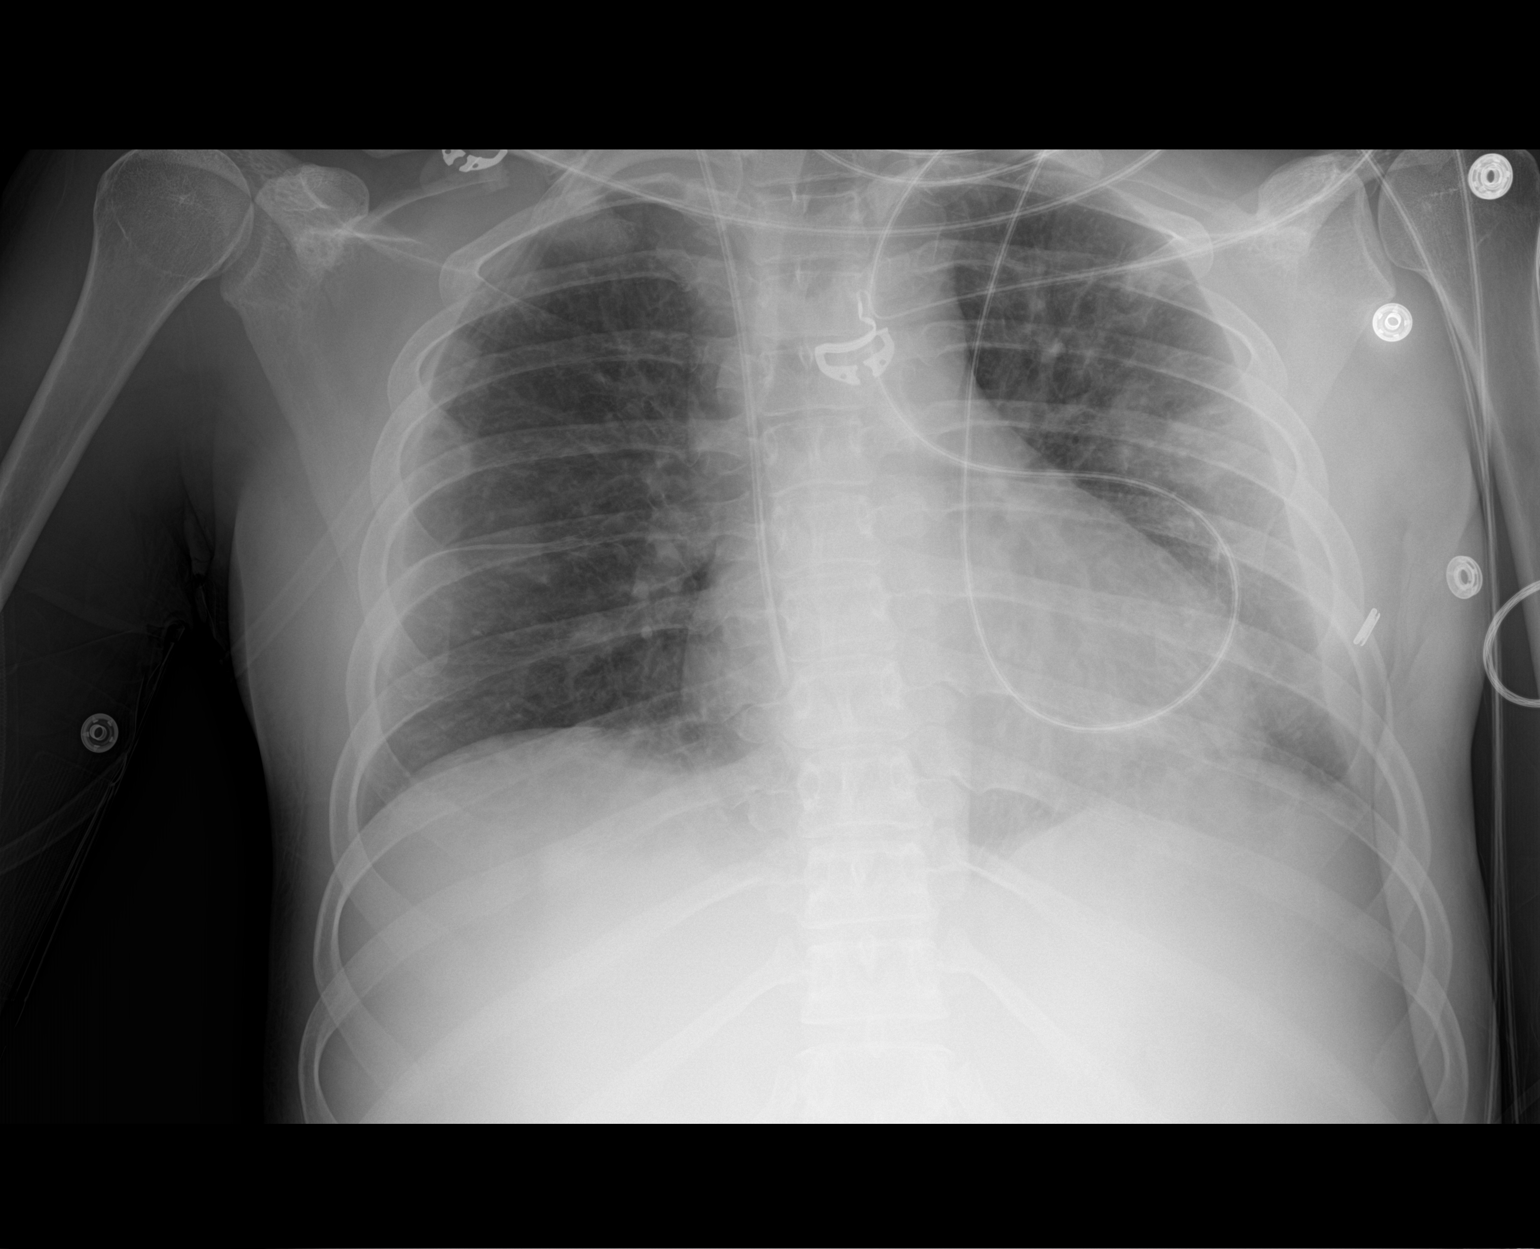

[1 of 1 positions shown; findings below may reference images not displayed]

FINDINGS: The right IJ line is noted ending at the right atrium. This could be
retracted 2-3 cm.

Mild vascular congestion is noted. Mild left-sided opacities may
reflect atelectasis or mild pneumonia. No pleural effusion or
pneumothorax is seen.

The cardiomediastinal silhouette is borderline normal in size. No
acute osseous abnormalities are identified.
IMPRESSION: 1. Right IJ line noted ending at the right atrium. This could be
retracted 2-3 cm, as deemed clinically appropriate.
2. Mild vascular congestion noted.
3. Mild left-sided airspace opacities may reflect atelectasis or
mild pneumonia.

## 2019-11-20 ENCOUNTER — Encounter: Payer: Self-pay | Admitting: General Practice
